# Patient Record
Sex: Female | Born: 1935
Health system: Southern US, Community
[De-identification: ages and names within clinical notes are randomized; demographics above are authoritative.]

## PROBLEM LIST (undated history)

## (undated) DIAGNOSIS — I2510 Atherosclerotic heart disease of native coronary artery without angina pectoris: Secondary | ICD-10-CM

## (undated) DIAGNOSIS — Z8719 Personal history of other diseases of the digestive system: Secondary | ICD-10-CM

## (undated) DIAGNOSIS — J449 Chronic obstructive pulmonary disease, unspecified: Secondary | ICD-10-CM

## (undated) DIAGNOSIS — E785 Hyperlipidemia, unspecified: Secondary | ICD-10-CM

## (undated) DIAGNOSIS — I10 Essential (primary) hypertension: Secondary | ICD-10-CM

## (undated) DIAGNOSIS — I252 Old myocardial infarction: Secondary | ICD-10-CM

## (undated) DIAGNOSIS — I495 Sick sinus syndrome: Secondary | ICD-10-CM

## (undated) DIAGNOSIS — I480 Paroxysmal atrial fibrillation: Secondary | ICD-10-CM

## (undated) DIAGNOSIS — E119 Type 2 diabetes mellitus without complications: Secondary | ICD-10-CM

## (undated) HISTORY — DX: Paroxysmal atrial fibrillation: I48.0

## (undated) HISTORY — DX: Type 2 diabetes mellitus without complications: E11.9

## (undated) HISTORY — DX: Old myocardial infarction: I25.2

## (undated) HISTORY — PX: CHOLECYSTECTOMY: SHX55

## (undated) HISTORY — DX: Personal history of other diseases of the digestive system: Z87.19

## (undated) HISTORY — PX: OTHER SURGICAL HISTORY: SHX169

## (undated) HISTORY — DX: Chronic obstructive pulmonary disease, unspecified: J44.9

## (undated) HISTORY — DX: Essential (primary) hypertension: I10

## (undated) HISTORY — PX: EXPLORATORY LAPAROTOMY: SUR591

## (undated) HISTORY — DX: Atherosclerotic heart disease of native coronary artery without angina pectoris: I25.10

## (undated) HISTORY — DX: Hyperlipidemia, unspecified: E78.5

## (undated) HISTORY — DX: Sick sinus syndrome: I49.5

---

## 1975-10-21 HISTORY — PX: PARTIAL COLECTOMY: SHX5273

## 1997-10-20 DIAGNOSIS — I252 Old myocardial infarction: Secondary | ICD-10-CM

## 1997-10-20 HISTORY — PX: OTHER SURGICAL HISTORY: SHX169

## 1997-10-20 HISTORY — DX: Old myocardial infarction: I25.2

## 2000-01-17 ENCOUNTER — Inpatient Hospital Stay (HOSPITAL_COMMUNITY): Admission: EM | Admit: 2000-01-17 | Discharge: 2000-01-24 | Payer: Self-pay | Admitting: Emergency Medicine

## 2000-01-17 ENCOUNTER — Encounter: Payer: Self-pay | Admitting: Emergency Medicine

## 2001-08-06 ENCOUNTER — Ambulatory Visit (HOSPITAL_COMMUNITY): Admission: RE | Admit: 2001-08-06 | Discharge: 2001-08-06 | Payer: Self-pay | Admitting: Family Medicine

## 2001-08-06 ENCOUNTER — Encounter: Payer: Self-pay | Admitting: Family Medicine

## 2001-09-01 ENCOUNTER — Ambulatory Visit (HOSPITAL_COMMUNITY): Admission: RE | Admit: 2001-09-01 | Discharge: 2001-09-01 | Payer: Self-pay | Admitting: Cardiology

## 2001-09-06 ENCOUNTER — Ambulatory Visit (HOSPITAL_COMMUNITY): Admission: RE | Admit: 2001-09-06 | Discharge: 2001-09-06 | Payer: Self-pay | Admitting: Family Medicine

## 2001-09-06 ENCOUNTER — Encounter: Payer: Self-pay | Admitting: Family Medicine

## 2002-02-21 ENCOUNTER — Encounter: Payer: Self-pay | Admitting: Family Medicine

## 2002-02-21 ENCOUNTER — Inpatient Hospital Stay (HOSPITAL_COMMUNITY): Admission: AD | Admit: 2002-02-21 | Discharge: 2002-02-26 | Payer: Self-pay | Admitting: Family Medicine

## 2002-02-22 ENCOUNTER — Encounter: Payer: Self-pay | Admitting: Family Medicine

## 2002-08-01 ENCOUNTER — Encounter: Payer: Self-pay | Admitting: Family Medicine

## 2002-08-01 ENCOUNTER — Ambulatory Visit (HOSPITAL_COMMUNITY): Admission: RE | Admit: 2002-08-01 | Discharge: 2002-08-01 | Payer: Self-pay | Admitting: Family Medicine

## 2002-08-08 ENCOUNTER — Ambulatory Visit (HOSPITAL_COMMUNITY): Admission: RE | Admit: 2002-08-08 | Discharge: 2002-08-08 | Payer: Self-pay | Admitting: Family Medicine

## 2002-08-08 ENCOUNTER — Encounter: Payer: Self-pay | Admitting: Family Medicine

## 2002-08-15 ENCOUNTER — Encounter: Payer: Self-pay | Admitting: Family Medicine

## 2002-08-15 ENCOUNTER — Inpatient Hospital Stay (HOSPITAL_COMMUNITY): Admission: EM | Admit: 2002-08-15 | Discharge: 2002-08-18 | Payer: Self-pay | Admitting: Emergency Medicine

## 2002-08-15 ENCOUNTER — Encounter: Payer: Self-pay | Admitting: Internal Medicine

## 2002-08-15 ENCOUNTER — Ambulatory Visit (HOSPITAL_COMMUNITY): Admission: RE | Admit: 2002-08-15 | Discharge: 2002-08-15 | Payer: Self-pay | Admitting: Family Medicine

## 2002-08-16 ENCOUNTER — Encounter: Payer: Self-pay | Admitting: Internal Medicine

## 2002-08-18 ENCOUNTER — Encounter: Payer: Self-pay | Admitting: Family Medicine

## 2002-09-21 ENCOUNTER — Ambulatory Visit (HOSPITAL_COMMUNITY): Admission: RE | Admit: 2002-09-21 | Discharge: 2002-09-21 | Payer: Self-pay | Admitting: Family Medicine

## 2002-09-21 ENCOUNTER — Inpatient Hospital Stay (HOSPITAL_COMMUNITY): Admission: AD | Admit: 2002-09-21 | Discharge: 2002-09-26 | Payer: Self-pay | Admitting: Internal Medicine

## 2002-09-21 ENCOUNTER — Encounter: Payer: Self-pay | Admitting: Family Medicine

## 2002-09-22 ENCOUNTER — Encounter: Payer: Self-pay | Admitting: Internal Medicine

## 2002-10-17 ENCOUNTER — Encounter: Payer: Self-pay | Admitting: Family Medicine

## 2002-10-17 ENCOUNTER — Ambulatory Visit (HOSPITAL_COMMUNITY): Admission: RE | Admit: 2002-10-17 | Discharge: 2002-10-17 | Payer: Self-pay | Admitting: Family Medicine

## 2002-11-07 ENCOUNTER — Encounter (HOSPITAL_COMMUNITY): Admission: RE | Admit: 2002-11-07 | Discharge: 2002-12-07 | Payer: Self-pay | Admitting: Cardiology

## 2003-01-09 ENCOUNTER — Ambulatory Visit (HOSPITAL_COMMUNITY): Admission: RE | Admit: 2003-01-09 | Discharge: 2003-01-09 | Payer: Self-pay | Admitting: Family Medicine

## 2003-01-09 ENCOUNTER — Encounter: Payer: Self-pay | Admitting: Family Medicine

## 2003-08-21 ENCOUNTER — Ambulatory Visit (HOSPITAL_COMMUNITY): Admission: RE | Admit: 2003-08-21 | Discharge: 2003-08-21 | Payer: Self-pay | Admitting: Family Medicine

## 2003-08-31 ENCOUNTER — Inpatient Hospital Stay (HOSPITAL_COMMUNITY): Admission: AD | Admit: 2003-08-31 | Discharge: 2003-09-07 | Payer: Self-pay | Admitting: Family Medicine

## 2003-09-25 ENCOUNTER — Inpatient Hospital Stay (HOSPITAL_COMMUNITY): Admission: EM | Admit: 2003-09-25 | Discharge: 2003-09-27 | Payer: Self-pay | Admitting: *Deleted

## 2003-11-07 ENCOUNTER — Ambulatory Visit (HOSPITAL_COMMUNITY): Admission: RE | Admit: 2003-11-07 | Discharge: 2003-11-07 | Payer: Self-pay | Admitting: Family Medicine

## 2003-12-15 ENCOUNTER — Ambulatory Visit (HOSPITAL_COMMUNITY): Admission: RE | Admit: 2003-12-15 | Discharge: 2003-12-15 | Payer: Self-pay | Admitting: Family Medicine

## 2003-12-19 ENCOUNTER — Inpatient Hospital Stay (HOSPITAL_COMMUNITY): Admission: AD | Admit: 2003-12-19 | Discharge: 2003-12-25 | Payer: Self-pay | Admitting: Family Medicine

## 2004-07-19 ENCOUNTER — Ambulatory Visit (HOSPITAL_COMMUNITY): Admission: RE | Admit: 2004-07-19 | Discharge: 2004-07-19 | Payer: Self-pay | Admitting: Family Medicine

## 2004-08-20 HISTORY — PX: ESOPHAGOGASTRODUODENOSCOPY: SHX1529

## 2004-08-20 HISTORY — PX: GIVENS CAPSULE STUDY: SHX5432

## 2004-08-27 ENCOUNTER — Ambulatory Visit: Payer: Self-pay | Admitting: Urgent Care

## 2004-08-30 ENCOUNTER — Ambulatory Visit (HOSPITAL_COMMUNITY): Admission: RE | Admit: 2004-08-30 | Discharge: 2004-08-30 | Payer: Self-pay | Admitting: Internal Medicine

## 2004-09-02 ENCOUNTER — Ambulatory Visit (HOSPITAL_COMMUNITY): Admission: RE | Admit: 2004-09-02 | Discharge: 2004-09-02 | Payer: Self-pay | Admitting: Internal Medicine

## 2004-09-02 ENCOUNTER — Ambulatory Visit: Payer: Self-pay | Admitting: Internal Medicine

## 2004-09-19 ENCOUNTER — Ambulatory Visit (HOSPITAL_COMMUNITY): Admission: RE | Admit: 2004-09-19 | Discharge: 2004-09-19 | Payer: Self-pay | Admitting: Internal Medicine

## 2004-09-19 HISTORY — PX: COLONOSCOPY: SHX174

## 2004-11-13 ENCOUNTER — Ambulatory Visit: Payer: Self-pay | Admitting: Internal Medicine

## 2005-01-08 ENCOUNTER — Ambulatory Visit: Payer: Self-pay | Admitting: Internal Medicine

## 2005-03-10 ENCOUNTER — Ambulatory Visit: Payer: Self-pay | Admitting: *Deleted

## 2005-04-17 ENCOUNTER — Ambulatory Visit: Payer: Self-pay | Admitting: Internal Medicine

## 2005-05-05 ENCOUNTER — Ambulatory Visit (HOSPITAL_COMMUNITY): Admission: RE | Admit: 2005-05-05 | Discharge: 2005-05-05 | Payer: Self-pay | Admitting: Ophthalmology

## 2005-07-14 ENCOUNTER — Ambulatory Visit (HOSPITAL_COMMUNITY): Admission: RE | Admit: 2005-07-14 | Discharge: 2005-07-14 | Payer: Self-pay | Admitting: Family Medicine

## 2005-07-18 ENCOUNTER — Ambulatory Visit: Payer: Self-pay | Admitting: *Deleted

## 2005-07-23 ENCOUNTER — Ambulatory Visit: Payer: Self-pay | Admitting: *Deleted

## 2005-07-23 ENCOUNTER — Inpatient Hospital Stay (HOSPITAL_COMMUNITY): Admission: AD | Admit: 2005-07-23 | Discharge: 2005-08-02 | Payer: Self-pay | Admitting: Internal Medicine

## 2005-08-05 ENCOUNTER — Ambulatory Visit: Payer: Self-pay | Admitting: *Deleted

## 2005-08-07 ENCOUNTER — Ambulatory Visit: Payer: Self-pay | Admitting: Internal Medicine

## 2005-08-14 ENCOUNTER — Ambulatory Visit: Payer: Self-pay | Admitting: Internal Medicine

## 2005-08-28 ENCOUNTER — Ambulatory Visit: Payer: Self-pay | Admitting: Internal Medicine

## 2005-10-09 ENCOUNTER — Ambulatory Visit: Payer: Self-pay | Admitting: Internal Medicine

## 2006-03-20 ENCOUNTER — Ambulatory Visit (HOSPITAL_COMMUNITY): Admission: RE | Admit: 2006-03-20 | Discharge: 2006-03-20 | Payer: Self-pay | Admitting: Family Medicine

## 2007-04-08 ENCOUNTER — Ambulatory Visit (HOSPITAL_COMMUNITY): Admission: RE | Admit: 2007-04-08 | Discharge: 2007-04-08 | Payer: Self-pay | Admitting: Ophthalmology

## 2007-06-29 ENCOUNTER — Ambulatory Visit (HOSPITAL_COMMUNITY): Admission: RE | Admit: 2007-06-29 | Discharge: 2007-06-29 | Payer: Self-pay | Admitting: Family Medicine

## 2008-05-31 ENCOUNTER — Ambulatory Visit (HOSPITAL_COMMUNITY): Admission: RE | Admit: 2008-05-31 | Discharge: 2008-05-31 | Payer: Self-pay | Admitting: Family Medicine

## 2008-12-08 ENCOUNTER — Ambulatory Visit: Payer: Self-pay | Admitting: Cardiology

## 2008-12-21 ENCOUNTER — Ambulatory Visit: Payer: Self-pay | Admitting: Cardiology

## 2008-12-21 ENCOUNTER — Encounter (HOSPITAL_COMMUNITY): Admission: RE | Admit: 2008-12-21 | Discharge: 2009-01-20 | Payer: Self-pay | Admitting: Cardiology

## 2009-06-22 ENCOUNTER — Ambulatory Visit (HOSPITAL_COMMUNITY): Admission: RE | Admit: 2009-06-22 | Discharge: 2009-06-22 | Payer: Self-pay | Admitting: Family Medicine

## 2009-07-05 ENCOUNTER — Ambulatory Visit (HOSPITAL_COMMUNITY): Admission: RE | Admit: 2009-07-05 | Discharge: 2009-07-05 | Payer: Self-pay | Admitting: Family Medicine

## 2009-12-26 DIAGNOSIS — E119 Type 2 diabetes mellitus without complications: Secondary | ICD-10-CM | POA: Insufficient documentation

## 2009-12-26 DIAGNOSIS — I10 Essential (primary) hypertension: Secondary | ICD-10-CM

## 2009-12-26 DIAGNOSIS — J449 Chronic obstructive pulmonary disease, unspecified: Secondary | ICD-10-CM

## 2009-12-26 DIAGNOSIS — I2510 Atherosclerotic heart disease of native coronary artery without angina pectoris: Secondary | ICD-10-CM

## 2009-12-26 DIAGNOSIS — E785 Hyperlipidemia, unspecified: Secondary | ICD-10-CM

## 2009-12-28 ENCOUNTER — Ambulatory Visit: Payer: Self-pay | Admitting: Cardiology

## 2010-01-14 ENCOUNTER — Ambulatory Visit (HOSPITAL_COMMUNITY): Admission: RE | Admit: 2010-01-14 | Discharge: 2010-01-14 | Payer: Self-pay | Admitting: Family Medicine

## 2010-08-01 ENCOUNTER — Ambulatory Visit (HOSPITAL_COMMUNITY): Admission: RE | Admit: 2010-08-01 | Discharge: 2010-08-01 | Payer: Self-pay | Admitting: Family Medicine

## 2010-11-10 ENCOUNTER — Encounter: Payer: Self-pay | Admitting: Cardiology

## 2010-11-19 NOTE — Assessment & Plan Note (Signed)
Summary: 1 YR F/U PER CHECKOUT ON 12/08/08/TG   Visit Type:  Follow-up Primary Provider:  Dr.Mark Nobie Putnam  CC:  no complaints today pt on oxygen.  History of Present Illness: [name] returns today for evaluation of her coronary disease. She is having no angina or ischemic symptoms. Her stress Myoview December 21, 2008 showed ejection fraction of 50% with breast attenuation. She is at a previous intervention of her right coronary artery years ago.  She's lost a significant amount of weight. She dropped from 216 2 today weighing 160 pounds.  She wears oxygen continuously.  She denies orthopnea, PND or peripheral edema. She's had no palpitations or syncope.  Current Medications (verified): 1)  Advair Diskus 500-50 Mcg/dose Aepb (Fluticasone-Salmeterol) .... Use As Needed 2)  Furosemide 40 Mg Tabs (Furosemide) .... Take 1 Tab Daily 3)  Glipizide 5 Mg Tabs (Glipizide) .... Take 1 Tab Daily 4)  Aspir-Low 81 Mg Tbec (Aspirin) .... Take 1 Tab Daily 5)  Metoprolol Tartrate 50 Mg Tabs (Metoprolol Tartrate) .... Take 1/2 Tab Two Times A Day 6)  Lipitor 10 Mg Tabs (Atorvastatin Calcium) .... Take 1/2 Tab Daily 7)  Omeprazole 20 Mg Cpdr (Omeprazole) .... Take 1 Tab Daily 8)  Diovan 160 Mg Tabs (Valsartan) .... Take 1 Tab Daily 9)  Spiriva Handihaler 18 Mcg Caps (Tiotropium Bromide Monohydrate) .... Use As Needed 10)  Imdur 30 Mg Xr24h-Tab (Isosorbide Mononitrate) .... Take 1 Tab Daily 11)  Niacin Cr 500 Mg Cr-Caps (Niacin) .... Take 1 Tab Daily 12)  Fish Oil 1000 Mg Caps (Omega-3 Fatty Acids) .... Take 1 Cap Qid 13)  Duoneb 0.5-2.5 (3) Mg/61ml Soln (Ipratropium-Albuterol) .... As Needed  Allergies (verified): No Known Drug Allergies  Past History:  Past Medical History: Last updated: January 23, 2010 Current Problems:  ASTHMA (ICD-493.90) CAD (ICD-414.00) HYPERTENSION (ICD-401.9) HYPERLIPIDEMIA (ICD-272.4) DM (ICD-250.00)  Past Surgical History: Last updated: 01-23-2010 cesareen  section small bowel obstruction coronary stenting cholecystectomy  Family History: Last updated: January 23, 2010 Father:deceased cause unknown Mother:deceased cause unknown  Review of Systems       negative other than history of present illness  Vital Signs:  Patient profile:   77 year old female Height:      64 inches Weight:      160 pounds BMI:     27.56 Pulse rate:   57 / minute BP sitting:   149 / 57  (right arm)  Vitals Entered By: Kathy Saa, CNA (December 28, 2009 11:44 AM)  Physical Exam  General:  chronically ill, wearing O2 Head:  normocephalic and atraumatic Eyes:  PERRLA/EOM intact; conjunctiva and lids normal. Neck:  Neck supple, no JVD. No masses, thyromegaly or abnormal cervical nodes. Lungs:  decreased breath sounds throughout Heart:  soft S1-S2, regular rate and rhythm Msk:  decreased ROM.   Pulses:  pulses normal in all 4 extremities Extremities:  No clubbing or cyanosis. Neurologic:  Alert and oriented x 3. Skin:  Intact without lesions or rashes. Psych:  Normal affect.   Impression & Recommendations:  Problem # 1:  CAD (ICD-414.00) Assessment Unchanged  Her updated medication list for this problem includes:    Aspir-low 81 Mg Tbec (Aspirin) .Marland Kitchen... Take 1 tab daily    Metoprolol Tartrate 50 Mg Tabs (Metoprolol tartrate) .Marland Kitchen... Take 1/2 tab two times a day    Imdur 30 Mg Xr24h-tab (Isosorbide mononitrate) .Marland Kitchen... Take 1 tab daily  Problem # 2:  HYPERTENSION (ICD-401.9) Assessment: Unchanged  Her updated medication list for this problem includes:  Furosemide 40 Mg Tabs (Furosemide) .Marland Kitchen... Take 1 tab daily    Aspir-low 81 Mg Tbec (Aspirin) .Marland Kitchen... Take 1 tab daily    Metoprolol Tartrate 50 Mg Tabs (Metoprolol tartrate) .Marland Kitchen... Take 1/2 tab two times a day    Diovan 160 Mg Tabs (Valsartan) .Marland Kitchen... Take 1 tab daily  Problem # 3:  HYPERLIPIDEMIA (ICD-272.4)  Her updated medication list for this problem includes:    Lipitor 10 Mg Tabs (Atorvastatin  calcium) .Marland Kitchen... Take 1/2 tab daily    Niacin Cr 500 Mg Cr-caps (Niacin) .Marland Kitchen... Take 1 tab daily  Problem # 4:  DM (ICD-250.00) Assessment: Improved  Her updated medication list for this problem includes:    Glipizide 5 Mg Tabs (Glipizide) .Marland Kitchen... Take 1 tab daily    Aspir-low 81 Mg Tbec (Aspirin) .Marland Kitchen... Take 1 tab daily    Diovan 160 Mg Tabs (Valsartan) .Marland Kitchen... Take 1 tab daily  Patient Instructions: 1)  Your physician recommends that you schedule a follow-up appointment in: 12 months 2)  Your physician recommends that you continue on your current medications as directed. Please refer to the Current Medication list given to you today.

## 2010-12-23 ENCOUNTER — Encounter: Payer: Self-pay | Admitting: *Deleted

## 2011-01-14 ENCOUNTER — Ambulatory Visit (INDEPENDENT_AMBULATORY_CARE_PROVIDER_SITE_OTHER): Payer: Medicare Other | Admitting: Cardiology

## 2011-01-14 ENCOUNTER — Encounter: Payer: Self-pay | Admitting: Cardiology

## 2011-01-14 VITALS — BP 143/66 | HR 62 | Ht 64.0 in | Wt 188.0 lb

## 2011-01-14 DIAGNOSIS — I2510 Atherosclerotic heart disease of native coronary artery without angina pectoris: Secondary | ICD-10-CM

## 2011-01-14 DIAGNOSIS — G4700 Insomnia, unspecified: Secondary | ICD-10-CM

## 2011-01-14 MED ORDER — DIAZEPAM 5 MG PO TABS: 5.0000 mg | ORAL_TABLET | Freq: Every evening | ORAL | Status: DC | PRN

## 2011-01-14 NOTE — Assessment & Plan Note (Signed)
Stable, continue to treat medically.

## 2011-01-14 NOTE — Progress Notes (Signed)
   Patient ID: [name], female    DOB: May 29, 1931, 77 y.o.   MRN: [id number]  HPI  [name] returns for E and M of her CAD. She has been depressed after losing her husband [name] last October. She has no angina or chest pain. She wears O2 24/7 and has chronic SOB. We are treating her CAD medically. Compliant with her meds. Establishing with [name] as primary end of this month.  EKG shows SB with RBBB, Old IMI    Review of Systems  [all other systems reviewed and are negative      Physical Exam  Constitutional: She is oriented to person, place, and time. She appears well-developed and well-nourished.       Obese, wearing O2, chronically ill  HENT:  Head: Atraumatic.  Eyes: EOM are normal. Pupils are equal, round, and reactive to light.  Neck: Neck supple. No JVD present. No thyromegaly present.  Cardiovascular: Normal rate, regular rhythm, normal heart sounds and intact distal pulses.        Bilateral bruits, carotid  Pulmonary/Chest: Effort normal.       Decreased BS throughout.  Abdominal: Soft. Bowel sounds are normal.  Musculoskeletal: She exhibits edema.  Neurological: She is alert and oriented to person, place, and time.  Skin: Skin is warm and dry.  Psychiatric:       Depressed affect

## 2011-01-14 NOTE — Patient Instructions (Signed)
Your physician recommends that you schedule a follow-up appointment in: *1 year Your physician has recommended you make the following change in your medication: Dr. Daleen Squibb continued your diazepam for 1 month until you see Dr. Juanetta Gosling

## 2011-01-30 ENCOUNTER — Encounter: Payer: Self-pay | Admitting: Cardiology

## 2011-03-04 NOTE — Assessment & Plan Note (Signed)
[facility]                       [place] CARDIOLOGY OFFICE NOTE   NAME:[name], [name]                    MRN:          [id number]  DATE:12/08/2008                            DOB:          November 04, 1930    [name] comes in today to reestablish with [place], in particular me as  her cardiologist.   HISTORY OF PRESENT ILLNESS:  [name] is now 77 years of age, married  white female, who has a known history of coronary artery disease.  She  is currently not having any symptoms other than some dyspnea on  exertion, but she has O2-dependent COPD.  She specifically denies any  angina and has not had to use any nitroglycerin in quite some time.   Her last cardiac assessment was actually in 2004.  We did a stress  Myoview for some EKG changes.  This showed EF of 47% with no evidence of  ischemia.   PAST MEDICAL HISTORY:  Cardiac-wise, she has a history of an inferior  wall infarct in February 1999 treated with PTCA and stenting with 3 NIR  stents to the right coronary artery.  She subsequently established with  [place] and her last intervention was high-speed rotational atherectomy with  a cutting balloon angioplasty in 2001.   She has had relatively well-preserved LV function.   Her risk factors for coronary artery disease include age, known CAD,  hypertension, COPD, remote smoking, and hyperlipidemia.   She is followed on regular basis by [name].  She sees Dr.  Sherene Page in Pulmonary.   CURRENT MEDICATIONS:  1. Furosemide 40 mg a day.  2. Glipizide extended release 10 mg a day.  3. Aspirin 81 mg a day.  4. Metoprolol 25 mg p.o. b.i.d.  5. Lipitor 5 mg p.o. nightly.  6. Omeprazole 40 mg a day.  7. Diovan 160 mg a day.  8. Spiriva hand-held nebulizer daily.  9. Isosorbide mononitrate extended release 30 mg p.o. daily.  10.Advair 100/50 one b.i.d.  11.Niacin 500 mg per day.  12.Omega-3 fish oil 1000 mg per day.   She list CODEINE as an  intolerance.  No specific allergies.   She was hospitalized in the past with pneumonia twice.   SURGERIES:  Cholecystectomy.   SOCIAL HISTORY:  She is retired.  She is married to [name], who is  a former patient of mine as well.  She has 2 children.  [place] in  [name]   FAMILY HISTORY:  Negative for premature coronary artery disease.   REVIEW OF SYSTEMS:  HEENT:  Head is negative.  EYE:  She wears glasses  and has had cataracts removed in both eyes.  EAR, NOSE, and THROAT:  Negative.  TEETH/GUM:  She has positive dentures top and bottom.  CARDIORESPIRATORY:  Hypertension and other mentioned in the HPI.  GASTROINTESTINAL:  Negative except for history of an ulcer in the  distant past.  GENITOURINARY:  Negative.  GYNECOLOGICAL:  Negative.  ENDOCRINE:  Negative.  MUSCULOSKELETAL:  Chronic arthritis.  The rest  review of systems are negative.   PHYSICAL EXAMINATION:  VITAL SIGNS:  Blood pressure  is 100/60 in the  right arm and pulse is 59.  Her EKG shows sinus brady with a right  bundle with an inferior wall infarct pattern with persistent ST-segment  changes in II, III, and aVF as well as the V4, V5, and V6.  This is  identical to the EKG changes that were seen in 2003 except for now there  is changes in apical leads well.  HEENT:  She is wearing O2.  PERRLA.  Extraocular movements intact.  Sclerae clear.  Face symmetry is normal.  She has dentures.  Oral mucosa  is unremarkable.  NECK:  Supple.  Carotid upstrokes were equal bilaterally with a very  soft carotid sound in the left.  Thyroid is not enlarged.  Trachea is  midline.  LUNGS:  Clear to auscultation.  Decreased breath sounds throughout.  No  rhonchi or wheezes.  HEART:  A nondisplaced PMI.  She has a soft systolic murmur at the apex.  No gallop or rub.  Heart sounds are pretty soft.  ABDOMEN:  Soft, good bowel sounds.  No midline bruit.  No hepatomegaly  and no pulsatile mass.  EXTREMITIES:  No edema.  Pulses  were present.  There is some varicose  veins.  No sign of DVT.  NEUROLOGIC:  Intact.  SKIN:  Thin, she has a few ecchymoses.   ASSESSMENT AND PLAN:  [name] is long overdue objective assessment  of her coronary artery disease.  She has a very soft systolic sound in  the left side of her neck which is probably not any major obstruction.  However, she does have evidence of atherosclerosis other places.  She is  on excellent medical program per Dr. Nobie Putnam.   PLAN:  1. Adenosine rest stress Myoview.  I suspect this will be stable, but      we need to rule out progressive clinically overt coronary artery      disease.  2. Carotid Dopplers.  3. See me back in a year if these are unremarkable.     Kathy C. Daleen Squibb, MD, [facility]  Electronically Signed    TCW/MedQ  DD: 12/08/2008  DT: 12/09/2008  Job #: [id number]   cc:   Patrica Page, M.D.

## 2011-03-07 NOTE — Discharge Summary (Signed)
[name], [name]                       ACCOUNT NO.:  [id number]   MEDICAL RECORD NO.:  [id number]                   PATIENT TYPE:  INP   LOCATION:  A216                                 FACILITY:  [facility]   PHYSICIAN:  Patrica Duel, M.D.                 DATE OF BIRTH:  Mar 22, 1931   DATE OF ADMISSION:  12/19/2003  DATE OF DISCHARGE:  12/25/2003                                 DISCHARGE SUMMARY   DISCHARGE DIAGNOSES:  1. Exacerbation of chronic obstructive pulmonary disease,     refractory/recurrent with good response to current regimen.  2. Atherosclerotic cardiovascular disease, status post multiple coronary     stenting in 1999.  3. Congestive heart failure, compensated.  Ejection fraction approximately     51% on recent Cardiolite study.  4. Hypertension, controlled.  5. Gastroesophageal reflux disease.  6. Diabetes mellitus well-controlled with diet.   HISTORY OF PRESENT ILLNESS:  For details regarding admission, please refer  to the admitting note.  Briefly, this 76 year old female with the above  history presented to the office on February 19, with apparent asthmatic  bronchitis.  She was treated with steroids, antibiotics and symptomatic  therapy.  She returned six days later complaining of no improvement.  Chest  x-ray was obtained which revealed cardiomegaly and mild pulmonary venous  hypertension, but no infiltrates and Pulmicort was added to her regimen.  She returned to the office two days later with the same complaints and was  admitted for refractory asthmatic bronchitis, considering the possibility of  a component of cardiac asthma.   Of note, the patient has had several episodes quite similar to this event.  She has been admitted multiple times with refractory COPD.  The patient was  admitted for bronchospastic disease which failed to respond to outpatient  therapy.   HOSPITAL COURSE:  The patient was admitted and administered high-dose  steroids as well as  aminophylline drip.  Her heart rate increased somewhat  and this drip was discontinued.  She was placed on p.o. Theo-Dur at 200 mg  b.i.d.  Her heart rate was controlled, however, levels were subtherapeutic  at approximately 6.  She has continued to improve on her steroids and was  converted to p.o. prednisone three days ago.  She has had intermittent  wheezing only and is currently stable for discharge.  The patient requests a  pulmonary consultation by [facility] and Dr. Sherene Sires will see the  patient.   Currently, the patient is stable.  She has been cleared by cardiology.  Her  CHF does not appear to be a component of her current syndrome.  She was  diuresed initially and her BUN and creatinine increased significantly to 90  and 2.5.  This has resolved with lower dose of Lasix and IV hydration.   Currently, the patient is stable for discharge.  She will be followed very  closely as an outpatient.   DISCHARGE  MEDICATIONS:  1. Spiriva one puff q.d.  2. Advair 500/50 one puff b.i.d.  3. Theo-Dur 200 mg b.i.d.  4. Prednisone tapering dose starting with 40 mg over a 10-day period.  5. Toprol XL 25 q.d.  6. Accupril 5 mg q.d.  7. Aspirin.  8. Multivitamins.  9. Lipitor 10 mg q.d.  10.      Levaquin 500 mg q.d.  11.      Lasix (decreased) 40 mg q.d.   FOLLOW UP:  She will be followed and treated expectantly as an outpatient.     ___________________________________________                                         Patrica Duel, M.D.   MC/MEDQ  D:  12/25/2003  T:  12/25/2003  Job:  [id number]   cc:   Charlaine Dalton. Sherene Sires, M.D. [facility]

## 2011-03-07 NOTE — Discharge Summary (Signed)
   [name], Page                       ACCOUNT NO.:  [id number]   MEDICAL RECORD NO.:  [id number]                   PATIENT TYPE:  INP   LOCATION:  A212                                 FACILITY:  [facility]   PHYSICIAN:  Patrica Duel, MD                   DATE OF BIRTH:  [birth year]   DATE OF ADMISSION:  08/15/2002  DATE OF DISCHARGE:  08/18/2002                                 DISCHARGE SUMMARY   DISCHARGE DIAGNOSES:  1. Exacerbation chronic obstructive pulmonary disease.  2. Coronary artery disease.  3. Congestive heart failure, compensated.  4. Diabetes, well controlled.   HISTORY OF PRESENT ILLNESS:  For details regarding admission, please refer  to admitting note.  Briefly, this 77 year old female with history as noted  above has been treated as an outpatient for an acute bronchitis with two  courses of antibiotics, home nebulizers, and steroid injections.  She failed  to respond appropriately and was admitted to the hospital for intensive  pulmonary toilet and IV therapy.   HOSPITAL COURSE:  The patient did very well in the hospital with prompt  resolution of her bronchospasm.  Her pO2 was initially 71.  She has remained  stable for approximately 36 hours without fever, significant cough, or  shortness of breath.   During the night the patient has developed mild hematuria.  Dr. Jerre Simon has  been consulted.  A CT scan of her abdomen currently is pending.   If okay with Dr. Jerre Simon, the patient will be discharged.   DISCHARGE MEDICATIONS:  1. Levaquin 500 mg q.d.  2. Prednisone pack 5 mg 12 day.  3. Sliding scale insulin.  4. Tussionex.  5. She has home nebulizers, etc. at home.   DISPOSITION:  She will be followed and treated expectantly as an outpatient.                                               Patrica Duel, MD    MC/MEDQ  D:  08/18/2002  T:  08/18/2002  Job:  [id number]

## 2011-03-07 NOTE — Op Note (Signed)
   [name], [name]                       ACCOUNT NO.:  [id number]   MEDICAL RECORD NO.:  [id number]                   PATIENT TYPE:  OUT   LOCATION:  RAD                                  FACILITY:  [facility]   PHYSICIAN:  Jae Dire, P.A. [facility]               DATE OF BIRTH:  08-22-1931   DATE OF PROCEDURE:  DATE OF DISCHARGE:  10/17/2002                                  PROCEDURE NOTE   INDICATIONS FOR PROCEDURE:  History of coronary artery disease with new  electrocardiogram changes.   LABORATORY DATA:  EKG sinus rhythm at 71 beats/minute with right bundle  branch block, inverted T in V1-V6, no change from previous EKG on 10/31/02.  Baseline blood pressure 132/72.   DESCRIPTION OF PROCEDURE:  Adenosine was infused over four minutes.  Cardiolite was injected at three minutes.   EKG, no arrhythmias.  No ischemic changes noted.  Blood pressure 140/70.  The patient experienced shortness of breath which resolved in the recovery.   Final images and results are pending M.D. review.                                               Jae Dire, P.A. [facility]    AB/MEDQ  D:  11/07/2002  T:  11/07/2002  Job:  [id number]

## 2011-03-07 NOTE — Consult Note (Signed)
[name], [name]                       ACCOUNT NO.:  [id number]   MEDICAL RECORD NO.:  [id number]                   PATIENT TYPE:  INP   LOCATION:  A216                                 FACILITY:  [facility]   PHYSICIAN:  [place] Bing, M.D.               DATE OF BIRTH:  [birth year]   DATE OF CONSULTATION:  12/19/2003  DATE OF DISCHARGE:                                   CONSULTATION   CONSULTING PHYSICIAN:  [place] Bing, M.D.   REFERRING PHYSICIAN:  Patrica Duel, M.D.   PRIMARY CARDIOLOGIST:  Formerly, Dr. Daleen Squibb.   HISTORY OF PRESENT ILLNESS:  A 77 year old woman with known coronary disease  and severe COPD admitted for progressive dyspnea.  Ms. Bays first  presented with acute myocardial infarction, February 1999, requiring primary  angioplasty and stenting of the RCA.  She returned in 2001, with re-  stenosis, treated with atherectomy.  A Cardiolite, three months ago, was low  risk and demonstrated an ejection fraction of 0.47.  Ms. Mantia developed  cough approximately a week or two ago.  This gradually resolved, but she  subsequently found herself persistently short of breath.  A course of  antibiotics was given.  She was admitted this morning from the office, after  presenting with increasing symptoms.  She had no orthopnea nor PND.  She has  noted some wheezing.  I do not see convincing evidence of prior congestive  heart failure.  She does have an 80 pack year history of cigarette smoking  which was discontinued in 1999.   PAST MEDICAL HISTORY:  1. Otherwise notable for hyperlipidemia.  2. Diabetes.  3. Hypertension.  4. Asthmatic bronchitis.   MEDICATIONS:  Prior to admission included:  1. Atorvastatin 5 mg every day.  2. Glipizide 10 mg every day.  3. Metoprolol 50 mg b.i.d.  4. Furosemide 80 mg every day.  5. Singulair 10 mg every day.  6. Accupril 5 mg every day.  7. Aspirin 81 mg every day.  8. KCl 20 mEq every day.  9. Advair inhaler.  10.       Spiriva inhaler.  11.      Albuterol nebs.   SOCIAL HISTORY:  Lives in [place] with her husband.  Retired from work at  a Human resources officer.  Denies excessive use of alcohol.   FAMILY HISTORY:  Mother died at age 61, cause unknown.  Father died at age  50 with diabetes.   REVIEW OF SYSTEMS:  Notable for urinary frequency, especially after taking  diuretics.  GERD symptoms.  All other systems negative or as noted above.   ALLERGIES:  CODEINE.   PHYSICAL EXAMINATION:  GENERAL:  A pleasant woman, lying flat in bed, in no  acute distress.  VITAL SIGNS:  The temperature is 99, heart rate 60, respirations 20, blood  pressure 180/90 initially; subsequently 125/75.  HEENT:  Anicteric sclera.  NECK:  Mild jugular venous distention.  Soft right bruit heard by __________  .  Minimal jugular venous distention.  ENDOCRINE:  No thyromegaly.  HEMATOPOIETIC:  No adenopathy.  SKIN:  No significant lesions.  CARDIAC:  Normal first and second heart sounds.  LUNGS:  Decreased breath sounds bilaterally.  Mild expiratory wheezing with  prolonged expiratory phase.  No rales.  ABDOMEN:  Soft and nontender.  Normal bowel sounds.  EXTREMITIES:  One half plus ankle edema.  Distal pulses intact.  Mild  inflammation over the right antecubital fossa at a former IV site.  NEUROMUSCULAR:  Symmetric strength and tone.  MUSCULOSKELETAL:  No joint deformities.   Chest x-ray:  Most recent not available.  On December 15, 2003, the report  was cardiomegaly, vascular redistribution, increased perihilar markings and  lingular scarring versus atelectasis.   EKG:  Sinus bradycardia, right bundle branch block, prior inferior  myocardial infarction, borderline low voltage, ST-T wave abnormality  consistent with anterolateral ischemia.   Other laboratory notable for:  Negative cardiac markers.  Normal CBC and  normal chemistry profile, except for minimally increased serum glucose and  bicarbonate.  Blood gas  demonstrates mild chronic respiratory acidosis.   IMPRESSION:  1. Ms. Aber presents with increasing dyspnea that may be attributable to     her known chronic lung disease, chronic heart disease, or a combination     of both.  Her exam is relatively benign.  She had fairly good left     ventricular systolic function when last assessed with no history of     congestive heart failure.  I suspect the current episode represents     primarily a chronic obstructive pulmonary disease exacerbation.  I will     review the chest x-ray when available.  A BNP level is pending.  We will     monitor fluid balance as well as daily weights.  She is receiving maximal     treatment for her lung disease which should produce substantial benefit.  2. There is no evidence for an acute coronary syndrome.  No further     evaluation of her coronary disease is anticipated.  No change in her     usual medical therapy is warranted.  3. Hypertension is well controlled.  4. Management of hyperlipidemia will be assessed.   Will be happy to follow this nice woman with you while she is in hospital.      ___________________________________________                                            [place] Bing, M.D.   RR/MEDQ  D:  12/19/2003  T:  12/20/2003  Job:  [id number]

## 2011-03-07 NOTE — H&P (Signed)
[name], [name]                       ACCOUNT NO.:  [id number]   MEDICAL RECORD NO.:  [id number]                  PATIENT TYPE:   LOCATION:                                       FACILITY:  [facility]   PHYSICIAN:  Patrica Duel, M.D.                 DATE OF BIRTH:  [birth year]   DATE OF ADMISSION:  12/19/2003  DATE OF DISCHARGE:                                HISTORY & PHYSICAL   CHIEF COMPLAINT:  Shortness of breath.   HISTORY OF PRESENT ILLNESS:  This is a 77 year old female with a history of  coronary artery disease, status post multiple coronary stents in 1999.  She  also has CHF with ejection fraction not available.   The patient has been admitted multiple times with exacerbations of COPD.   The patient also has hypertension, gastroesophageal reflux disease, and  diabetes mellitus currently diet controlled.   The patient presented to the office on December 09, 2003, with the chief  complaint of cough and congestion.  She apparently had asthmatic bronchitis,  was treated with steroids, antibiotics, and symptomatic therapy.  She  returned 6 days later complaining of no improvement.  A chest x-ray was  obtained which revealed cardiomegaly and mild pulmonary venous hypertension  but no infiltrates.  Pulmicort was added.   The patient presents back to the office with refractory symptoms.  She is  complaining primarily of orthopnea and dyspnea on exertion.  She denies any  chest pain.  She has a cough which is minimally productive and ongoing  wheezing despite maximal outpatient therapy.   The patient is admitted for exacerbation of COPD, question component of  congestive heart failure.   There is no history of headache, neurologic deficits, nausea, vomiting,  diarrhea, melena, hematemesis, hematochezia, or genitourinary symptoms.   MEDICATIONS:  1. Toprol-XL 25 daily.  2. Accupril 5 mg daily.  3. Aspirin.  4. Multiple vitamins.  5. Lipitor 10 daily.  6. Levaquin 500  daily (approximately 2-week course).  7. Lasix 80 mg daily.  8. Advair 250/50.  9. Spiriva 1 puff daily.   ALLERGIES:  CODEINE.   PAST MEDICAL HISTORY:  As noted above.  She is also status post C-sections  x2 and had a history of intestinal blockage in the remote past.   SOCIAL HISTORY:  Nonspecific (quit approximately 5 years ago).  She denies  use of alcohol.  She has a supportive husband.   FAMILY HISTORY:  Noncontributory.   REVIEW OF SYSTEMS:  Negative except as mentioned.   PHYSICAL EXAMINATION:  GENERAL:  Very pleasant female who appears mildly  cushingoid.  VITAL SIGNS:  Respiratory rate currently is approximately 20 and mildly  labored.  She is afebrile.  Blood pressure 160/95.  HEENT:  Normocephalic, atraumatic.  Pupils are equal.  Ears, nose, throat  are benign.  NECK:  Supple.  LUNGS:  Diffuse coarse wheezes with scattered rhonchi.  No distinct rales  are noted.  HEART:  Heart sounds are somewhat distant.  Without murmurs, rubs, or  gallops.  ABDOMEN:  Nontender, nondistended.  Bowel sounds are intact.  EXTREMITIES:  No clubbing, cyanosis, or edema.  NEUROLOGIC:  Nonfocal.   LABORATORY DATA:  Pending.   ASSESSMENT:  Refractory bronchospastic syndrome.  The patient has a history  of similar episodes in the past.  Consider component of pulmonary  edema/cardiac asthma.   PLAN:  Admit for pulmonary toilet, high-dose steroids, and antibiotic  coverage.  Will obvious cardiology consult.  Consider pulmonary consult  pending her progress.  Will institute amiodarone drip and follow her rhythm  closely.     ___________________________________________                                         Patrica Duel, M.D.   MC/MEDQ  D:  12/19/2003  T:  12/19/2003  Job:  [id number]

## 2011-03-07 NOTE — Cardiovascular Report (Signed)
[facility]  Patient:    [name], [name]                      MRN: [id number] Proc. Date: 01/20/00 Adm. Date:  [id number] Attending:  Rollene Rotunda CC:         Patrica Duel, M.D.             Thomas C. Wall, M.D. [facility]             Cardiac Catheterization Laboratory                        Cardiac Catheterization  PROCEDURES PERFORMED: 1. Left heart catheterization with coronary angiography and left ventriculography. 2. High-speed rotational atherectomy followed by balloon angioplasty with a cutting    balloon followed by percutaneous transluminal coronary angioplasty of the    proximal mid and distal right coronary artery. 3. Intravascular ultrasound of the right coronary artery.  INDICATIONS:  [name] is a 77 year old woman with a history of inferior myocardial infarction in February of 1999, treated with PTCA and stenting x 3 to the right coronary artery.  This was done at [facility] and according to their records she had three 3.5 x 32 mm NIR stents placed in the proximal mid and distal right coronary artery.  She presented to the hospital with unstable angina and was referred for cardiac catheterization.  DESCRIPTION OF PROCEDURE:  A 6 French sheath was placed in the right femoral artery.  Standard Judkins 6 French catheters were utilized.  Contrast was Omnipaque.  There were no complications.  CATHETERIZATION RESULTS:  HEMODYNAMICS:  Left ventricular pressure 138/15.  Aortic pressure 130/65.  No aortic valve gradient.  LEFT VENTRICULOGRAM:  There is moderate akinesis of the inferior wall. Ejection fraction calculated at 49%.  No mitral regurgitation.  CORONARY ARTERIOGRAPHY:  (Right dominant).  Left main:  Left main has a mid 30% stenosis and a distal 25% stenosis.  Left anterior descending:  The LAD has a proximal 25% stenosis followed by a focal area of ectasia followed by a second 25% stenosis.  There is normal sized  first  diagonal which is a 30% stenosis.  Left circumflex:  The left circumflex has a diffuse 20% in the mid vessel.  It gives rise to a normal sized OM-1 with a 30% stenosis, a large OM-2 with a 70% stenosis, small OM-3 and small OM-4.  Right coronary artery:  The right coronary artery is a dominant vessel.  It has  stents extending from the proximal all the way to the distal vessel.  There is diffuse in-stent re-stenosis with 80% stenosis proximally followed by 50%.  The mid vessel has an 80% stenosis and the distal vessel has a 95% in-stent stenosis. Beyond this there is TIMI-1 flow into the distal vessel with some left to right  collateral filling.  There is a posterior descending artery which arises from the acute margin which has a 70% stenosis at its origin.  There is a small first posterolateral branch and a normal second posterolateral branch.  Just proximal to the second posterolateral branch is a 75% stenosis, which was evident only after TIMI-3 was achieved in the distal vessel.  CATHETERIZATION RESULTS: 1. Mildly decreased left ventricular systolic function. 2. Two-vessel coronary artery disease as described with diffuse in-stent    re-stenosis in the proximal mid and distal right coronary artery.  There is    moderate disease in the  obtuse marginal branch which appears to have been    present on previous catheterization in February of 1999.  PLAN:  For percutaneous intervention of the right coronary artery.  See below.  PERCUTANEOUS TRANSLUMINAL CORONARY ANGIOPLASTY PROCEDURE:  Following completion of the diagnostic catheterization we opted to proceed with percutaneous intervention. Preexisting 6 French sheath in the right femoral artery was exchanged over a wire for an 8 [place] sheath.  We placed a 6 French sheath in the right femoral vein.  The patient was enrolled in the Cruise protocol and randomized to receive Lovenox plus Integrilin.  We used an 8  [place] JR 3.5 guiding catheter with side holes. We initially passed a BMW wire into the distal vessel.  We then exchanged through  transient catheter for a Rotafloppy Gold wire.  We then performed high-speed rotational atherectomy with a 1.75 mm bur.  This was done at 179,000 RPM in the  proximal vessel for two runs, each at 15 seconds.  We then advanced and repeated high-speed rotational atherectomy in the mid and distal vessel at 179,000 RPM for two runs, each for approximately 20 seconds.  Following this, we again achieved  TIMI-3 flow into the distal vessel.  We then used a 3.0 x 15 mm cutting balloon, inflated in the distal stent to 6 atmospheres.  The balloon was then pulled back sequentially back to the mid stent with multiple inflations performed, initially to 6 and then 8 atmospheres in the mid vessel.  We then used a 3.5 x 15 mm cutting  balloon in the mid to proximal vessel.  Several inflations were performed up to 6 atmospheres in the mid vessel and 8 atmospheres in the proximal vessel.  At that point there appeared to be a development of a thrombus in the distal vessel which was adherent to the wire.  We pulled the wire out but the thrombus remained more proximally in the stent.  We then passed a new wire into the vessel and this digitalized the thrombus causing it to go distally and there was temporary occlusion of the distal vessel.  We recanalized the vessel and then used a 3.0 20 mm CrossSail balloon in the very distal vessel where there had previously been  75% stenosis and inflated this to 2, 3, and then 5 atmospheres.  We achieved reperfusion in the distal vessel with residual of 25% with haze in that area. e then used a 3.5 x 25 mm Quantum Ranger within the distal stents and inflated this to 12 atmospheres.  This was pulled back within the mid stents and inflated to 4 atmospheres and then back in the more proximal vessel and inflated to 18 atmospheres.   Final angiographic images revealed patency of the right coronary artery with TIMI-3 flow into the distal vessel.  There is less than 20% residual  stenosis throughout the proximal, mid and distal vessel within the previously stented area.  In the very distal vessel in the AV groove portion of the right coronary artery where there had been a 75% stenosis, this was reduced to 25% with residual haze.  There was TIMI-3 flow into the distal vessel.  COMPLICATIONS:  None.  RESULTS:  Successful high-speed rotational atherectomy followed by cutting balloon angioplasty, followed by balloon angioplasty to proximal, mid and distal right coronary artery.  A very distal 75% stenosis was reduced to 25% residual with haze. An 80% in-stent re-stenosis in the proximal and mid vessel and 95% in-stent re-stenosis in the distal vessel were  all reduced to less than 20% residual.  PLAN:  The patient will be given one additional dose of Lovenox this evening. Integrilin will be continued for 24 hours. DD:  01/20/00 TD:  01/20/00 Job: [id number]

## 2011-03-07 NOTE — Op Note (Signed)
NAMESAFIATOU, [name]             ACCOUNT NO.:  [id number]   MEDICAL RECORD NO.:  [id number]          PATIENT TYPE:  AMB   LOCATION:  DAY                           FACILITY:  [facility]   PHYSICIAN:  R. Roetta Sessions, M.D. DATE OF BIRTH:  05/24/1931   DATE OF PROCEDURE:  08/30/2004  DATE OF DISCHARGE:                                 OPERATIVE REPORT   PROCEDURE:  Diagnostic esophagogastroduodenoscopy.   INDICATION FOR PROCEDURE:  The patient is a 77 year old lady with a several-  week history of black, tarry stools but no significant diminution in  hemoglobin.  She has not had any hematochezia.  EGD is now being done.  This  approach has been discussed with the patient at length.  The potential  risks, benefits, and alternatives have been reviewed, questions answered.  She is agreeable.   PROCEDURE NOTE:  O2 saturation, blood pressure, pulse, and respirations were  monitored throughout the entire procedure.   CONSCIOUS SEDATION:  Versed 3 mg IV, Demerol 75 mg IV in divided doses.   INSTRUMENT USED:  Olympus video chip system.   FINDINGS, ESOPHAGOGASTRODUODENOSCOPY:  Examination of the tubular esophagus  revealed no mucosal abnormality.  EG junction was easily traversed.   Stomach:  The gastric cavity was empty and insufflated well with air.  A  thorough examination of the gastric mucosa,  including a retroflexed view of  the proximal stomach and esophagogastric junction, revealed no mucosal  abnormalities.  Pylorus patent and easily traversed.  Examination of the  bulb and second portion revealed no abnormalities.   Therapeutic/diagnostic maneuvers performed:  None.   The patient tolerated the procedure well and was reacted in endoscopy.   IMPRESSION:  Normal esophagus, stomach, D1, D2.   Will recheck CBC today.  She is Hemoccult-positive.  No explanation based on  today's examination.  Had a colonoscopy in 2001 revealing left-sided  diverticula.  Given this scenario, will go  ahead and image her small bowel  via Givens capsule study in the near future and see where we stand with a  CBC today.  Further recommendations to follow.     Otelia Sergeant   RMR/MEDQ  D:  08/30/2004  T:  08/30/2004  Job:  [id number]   cc:   Patrica Duel, M.D.  [address]  [place]  [place] [address]  Fax: [phone]

## 2011-03-07 NOTE — Consult Note (Signed)
NAMEVERNESSA, LIKES             ACCOUNT NO.:  [id number]   MEDICAL RECORD NO.:  [id number]          PATIENT TYPE:  OUT   LOCATION:  RAD                           FACILITY:  [facility]   PHYSICIAN:  R. Roetta Sessions, M.D. DATE OF BIRTH:  09/06/1931   DATE OF CONSULTATION:  08/27/2004  DATE OF DISCHARGE:  07/19/2004                                   CONSULTATION   REQUESTING PHYSICIAN:  Patrica Duel, M.D.   REASON FOR CONSULTATION:  Hemoccult-positive stool.   HISTORY OF PRESENT ILLNESS:  Ms. Rusconi is a 77 year old Caucasian female  with history of GI bleeding in April 2001 while she was on heparin therapy.  More recently two months ago, she noticed melena.  She was having loose  diarrheal stool three times a day for about four to five weeks.  She has not  noticed any for the last two to three weeks.  She was seen by her primary  care physician and given Carafate as well as continuing her Nexium at 40 mg  daily.  She denies any abdominal pain, nausea, vomiting,, heartburn, or  indigestion.  She is on aspirin 81 mg daily.  She is having two to three  semi-formed brown stools per day without mucus or blood currently.  She  denies any dysphagia or odynophagia.   CURRENT MEDICATIONS:  1.  Lipitor 5 mg daily.  2.  Metoprolol 50 mg 1/2 tablet b.i.d.  3.  Nexium 40 mg daily.  4.  Aspirin 81 mg daily.  5.  Spiriva once daily.  6.  Glipizide 10 mg daily.  7.  Lasix 40 mg daily.  8.  Spironolactone 50 mg daily.  9.  Diovan 160 mg daily.  10. Albuterol p.r.n.  11. Cephalexin 250 mg four times a day   ALLERGIES:  CODEINE.   PAST MEDICAL HISTORY:  1.  Diabetes mellitus diagnosed in 1999.  2.  Hypertension.  3.  Hypercholesterolemia.  4.  Congestive heart failure.  5.  Frequent pneumonia with underlying COPD.  6.  C section x 2.  7.  MI in 1999 with cardiac catheterization and stent placement.  8.  Appendectomy.  9.  Cholecystectomy.  10. Small-bowel resection secondary to adhesions  in 1977, currently on home      O2.  11. Colonoscopy Feb 24, 2000, by Dr. Jena Gauss:  Internal hemorrhoids, otherwise      normal rectum, left sided diverticula.  12. EGD January 28, 2000, by Dr.  Jena Gauss revealed multiple small hyperplastic-      appearing sessile polyps with small overlying erosion, scar in the      antrum, CLOtest negative.   FAMILY HISTORY:  No known history of colorectal cancer, liver or abdominal  problems.  Mother, age 34, deceased.  Father, age 27, deceased secondary to  history of diabetes.  She has one healthy sister, two deceased with history  significant for coronary artery disease and an unknown cancer.  She has four  brothers alive and one deceased of unknown etiology.   SOCIAL HISTORY:  Ms. Drzewiecki has been married for 38 years.  She has two  grown, healthy children.  She is currently retired.  She has a 40-pack-year  tobacco use history, quit in 1999.  She denies any alcohol use.   REVIEW OF SYSTEMS:  Stable.  Denies any fever or chills.  Appetite is good.  She is complaining of some fatigue.  CARDIOVASCULAR:  Denies chest pain or  palpitations.  PULMONARY:  She has been on home O2 for the last six months.  Denies any shortness of breath, dyspnea, or cough at this time.  ENDOCRINE:  Does have history of diabetes mellitus currently on oral hyperglycemics.  The patient notes this has been fairly well controlled.  GI:  See HPI.   PHYSICAL EXAMINATION:  VITAL SIGNS:  Weight 226 pounds.  Blood pressure  124/70, pulse 70.  GENERAL:  A 77 year old, pale-appearing, Caucasian female.  She appears her  stated age.  She is on 2 liters O2 via nasal cannula.  No acute distress.  HEENT:  Sclerae clear, nonicteric.  Conjunctivae pink.  Oropharynx moist  without any lesions.  She has upper and lower dentures.  NECK:  Supple without thyromegaly.  HEART:  Regular rate and rhythm.  Normal S1 and S2.  No murmurs, rubs, or  gallops.  LUNGS:  Decreased breath sounds throughout.  No  wheezes or crackles.  ABDOMEN:  Protuberant with positive bowel sounds x 4.  She does have large  vertical scar just left of midline which is well healed.  Nontender,  nondistended without palpable mass or hepatosplenomegaly.  No rebound  tenderness or guarding.  EXTREMITIES:  2+ pedal pulses bilaterally, no edema.  RECTAL:  Deferred.   IMPRESSION:  Ms. Mccaffery is a 77 year old Caucasian female with previous  history of peptic ulcer disease in 2001 who presents with a four to five-  week history of melena three times a day.  She also has history of left-  sided diverticular disease.  Given the fact that her hemoglobin appears to  have remained stable, if she is having upper GI bleeding, it does not appear  to be significant volume.  Given her history, would like to further evaluate  her upper GI tract to rule out possible healing peptic ulcer disease.  She  is CLOtest negative.  She also has a history of diverticular disease and  could have diverticular bleeding as well.  Last colonoscopy was in 2001.   RECOMMENDATIONS:  1.  Will check CBC today.  2.  Will schedule EGD with Dr. Jena Gauss in the very near future.  I discussed      the procedure including risks and benefits to include but not limited to      bleeding, infection, perforation, informed consent will be obtained.  3.  She is to continue Nexium 40 mg daily.  4.  Further recommendations pending EGD.   We would like to thank Dr. Nobie Putnam for allowing [place] to participate in the  care of Ms. Yevonne Pax   KC/MEDQ  D:  08/27/2004  T:  08/27/2004  Job:  [id number]   cc:   Patrica Duel, M.D.  [address], [address]  [place]  [place] [address]  Fax: [phone]

## 2011-03-07 NOTE — Procedures (Signed)
NAME:  ANALISE, GLOTFELTY                       ACCOUNT NO.:  [id number]   MEDICAL RECORD NO.:  [id number]                   PATIENT TYPE:  INP   LOCATION:  A204                                 FACILITY:  [facility]   PHYSICIAN:  Vida Roller, M.D.                DATE OF BIRTH:  1931/05/10   DATE OF PROCEDURE:  09/26/2003  DATE OF DISCHARGE:  09/27/2003                                    STRESS TEST   ADENOSINE-CARDIOLITE STRESS TEST:   INDICATION:  Ms. Juarez is a 77 year old female with known coronary artery  disease status post inferior MI in February 1999 with treatment with stents  x3 to the RCA.  Repeat cardiac catheterization in 2001 revealed in-stent  restenosis which was treated with arthrectomy and cutting balloon  angioplasty to the RCA.  Last Cardiolite was in January 2004 which revealed  no ischemia and an ejection fraction of 51%.  She now presents with  recurrent chest discomfort.  She has had recent cardiac enzymes which are  negative for acute myocardial infarction.   Fifty one milligrams of Adenosine was infused over 4-minute protocol with  Cardiolite injected at 3 minutes.  The patient denied any symptoms.  EKG at  baseline revealed a sinus rhythm at 78 beats per minute with right bundle  branch block, this did not change throughout infusion, no ischemic changes  or arrhythmias were noted.  Baseline blood pressure was 130/68, this  increased to 132/68 during infusion.  Final images and results are pending  MD review.     ________________________________________  ___________________________________________  Jae Dire, P.A. [facility]                      Vida Roller, M.D.   AB/MEDQ  D:  09/26/2003  T:  09/27/2003  Job:  [id number]

## 2011-03-07 NOTE — Group Therapy Note (Signed)
NAMEJEREE, [name]             ACCOUNT NO.:  [id number]   MEDICAL RECORD NO.:  [id number]          PATIENT TYPE:  INP   LOCATION:  A226                          FACILITY:  [facility]   PHYSICIAN:  Edward L. Juanetta Gosling, M.D.DATE OF BIRTH:  30-Dec-1930   DATE OF PROCEDURE:  08/01/2005  DATE OF DISCHARGE:                                   PROGRESS NOTE   SUBJECTIVE:  [name] says she is doing better and hopes to go home  probably tomorrow.   OBJECTIVE:  GENERAL:  Her physical examination shows she looks more  comfortable.  VITAL SIGNS:  Her temperature is 97.6, pulse 94, respirations 20, blood  sugar 80, blood pressure 145/80, O2 saturations 96% on 2 L. CHEST:  Her  chest is much clearer.   ASSESSMENT:  She is better.   PLAN:  She is hopeful to go home tomorrow.  I think the biggest differences  is that her blood sugar has come under control.      Edward L. Juanetta Gosling, M.D.  Electronically Signed     ELH/MEDQ  D:  08/02/2005  T:  08/02/2005  Job:  [id number]

## 2011-03-07 NOTE — H&P (Signed)
[name], [name]                       ACCOUNT NO.:  [id number]   MEDICAL RECORD NO.:  [id number]                  PATIENT TYPE:   LOCATION:                                       FACILITY:   PHYSICIAN:  Corrie Mckusick, M.D.               DATE OF BIRTH:  06-15-1931   DATE OF ADMISSION:  09/25/2003  DATE OF DISCHARGE:                                HISTORY & PHYSICAL   ADMITTING DIAGNOSIS:  Chest pain.   PRIMARY CARE PHYSICIAN:  Patrica Duel, M.D.   HISTORY OF PRESENTING ILLNESS:  This 77 year old female with known history  of coronary artery disease, recently in the hospital for bronchitis,  presents with midsternal chest pain. She awoke in the middle of the night,  around 11 p.m. with such pain.  She had some associated nausea and no  associated diaphoresis or radiation.  Interestingly enough she had been on  loads of antibiotics and prednisone and felt like this could be coming more  from her stomach.  She was brought into the emergency department and put  on a nitroglycerin drip with complete resolution of chest pain from a 6/10  to a 0/10.  Her primary cardiologist, [facility] was consulted who  suggested that she transfer to [facility].  She declined this  transfer, against their advice.  Otherwise respiratory review of systems is  quite negative.   PAST MEDICAL HISTORY:  1. Coronary artery disease.  2. CHF.  3. History of MI with right coronary artery stents x3.  4. History of chronic bronchitis.  5. Hypertension.  6. Diabetes.   PAST SURGICAL HISTORY:  Status post cholecystectomy.   MEDICATIONS:  1. On admission are aspirin 81 mg p.o. daily.  2. Accupril 20 mg 1/4 p.o. daily.  3. Advair 250/50 b.i.d.  4. Singulair 10 mg p.o. daily.  5. Lipitor 5 mg p.o. q.p.m.  6. Demodex 20 mg p.o. q.a.m.  7. Glipizide 10 mg p.o. t.i.d.  8. K-Dur 20 mEq p.o. daily   ALLERGIES:  Codeine.   SOCIAL HISTORY:  Stopped smoking in 1999.  Does not drink any  alcohol.  Married with 2 children.   FAMILY HISTORY:  Significant for diabetes, some hypertension.   REVIEW OF SYSTEMS:  As in HPI.   PHYSICAL EXAMINATION:  VITAL SIGNS:  Temperature 97.9, blood pressure  134/74, heart rate in the 70s, respiratory rate 20s.  GENERAL:  When I saw [name] she was pleasant, talkative, joking, in no  acute distress and no chest pain.  She seemed to be back to her normal self.  HEENT:  Normocephalic, atraumatic. Nasal and oropharynx are clear.  NECK:  Supple. No lymphadenopathy.  CHEST:  Clear to auscultation and percussion.  CARDIOVASCULAR:  Regular rate and rhythm.  Normal S1-S2.  No murmurs,  gallops, clicks or rubs.  ABDOMEN:  Soft, nontender, nondistended.  EXTREMITIES:  No clubbing, cyanosis, or erythema.  Trace edema.  NEUROLOGICALLY:  Intact.   ADMITTING LABS:  White count of 6.0, hemoglobin of 13.4, hematocrit of 40.8,  platelets of 134, INR of 0.9.  Sodium of 138, potassium 4.0, chloride 99,  total CO2 of 30, glucose of 159, BUN 25, creatinine 1.3, total bilirubin  0.4, alkaline phosphatase 56, AST 15, ALT 20, CPK 28, CK/MB 1.5, troponin of  0.8.  Chest x-ray cardiomegaly, changes of bronchitis with no infiltrates or  edema.  EKG showed no significant changes from prior EKG.   ASSESSMENT:  A 77 year old with known coronary disease and multiple  comorbidities who presents with chest pain.  This actually could be from her  GI irritation due to the recent antibiotics and steroids.  She feels it  could be thrush in her mouth.  The oropharynx, otherwise, is quite clear.   PLAN:  1. Admit for rule out protocol.  2. Continue to monitor.  3. Nitro paste to keep chest pain free.  4. [facility] has already been consulted and suggested she transfer to     [facility], which the patient declined.  5. We will start Protonix IV.  6. Nystatin swish and swallow for her throat.  7. Will continue to follow closely.      ___________________________________________                                         Corrie Mckusick, M.D.   JCG/MEDQ  D:  09/25/2003  T:  09/25/2003  Job:  [id number]

## 2011-03-07 NOTE — Group Therapy Note (Signed)
NAMECRYSTALINA, STODGHILL             ACCOUNT NO.:  [id number]   MEDICAL RECORD NO.:  [id number]          PATIENT TYPE:  INP   LOCATION:  A226                          FACILITY:  [facility]   PHYSICIAN:  Edward L. Juanetta Gosling, M.D.DATE OF BIRTH:  [birth year]   DATE OF PROCEDURE:  DATE OF DISCHARGE:                                   PROGRESS NOTE   Patient of Social research officer, government.   SUBJECTIVE:  Mrs. Mussell says she does not feel as well as she would like,  but she is a bit better than she was earlier. She is not as congested, but  she is still wheezing.   Her physical examination shows her temperature is 97, pulse is 84,  respirations 20, blood sugar is 260, blood pressure 132/63. O2 sat is 92% on  2 liters. Her chest is clearer but still with wheezes bilaterally.   ASSESSMENT:  She continues to have problems.   PLAN:  I have discussed his situation with Dr. Nobie Putnam. He is going to try  her on p.o. meds, but she has had a lot of difficulty with IV access so he  is going to go ahead and ask for consultation for possible PIC line because  of the difficulty with IV access. We are concerned that even if we switch  her to p.o. now she may end up needing IV medications later.      Edward L. Juanetta Gosling, M.D.  Electronically Signed     ELH/MEDQ  D:  07/28/2005  T:  07/28/2005  Job:  [id number]

## 2011-03-07 NOTE — Group Therapy Note (Signed)
NAMEJOBY, HERSHKOWITZ             ACCOUNT NO.:  [id number]   MEDICAL RECORD NO.:  [id number]          PATIENT TYPE:  INP   LOCATION:  A226                          FACILITY:  [facility]   PHYSICIAN:  Edward L. Juanetta Gosling, M.D.DATE OF BIRTH:  [birth year]   DATE OF PROCEDURE:  DATE OF DISCHARGE:                                   PROGRESS NOTE   SUBJECTIVE:  [name] says she is better and feels better in general. She  is still some short of breath. She has still got some wheezing.   Her physical exam shows that her temperature is 98.6, pulse 96, respirations  20, blood sugar 195, blood pressure 146/73. O2 sat is 91% on 2 liters. She  still has wheezes bilaterally.   ASSESSMENT:  She is improving.   PLAN:  She is going to have more physical therapy today, try to get up and  moving around. I think that is certainly appropriate and then perhaps she  can go home fairly soon.      Edward L. Juanetta Gosling, M.D.  Electronically Signed     ELH/MEDQ  D:  07/31/2005  T:  07/31/2005  Job:  [id number]

## 2011-03-07 NOTE — Procedures (Signed)
[name], [name]             ACCOUNT NO.:  [id number]   MEDICAL RECORD NO.:  [id number]          PATIENT TYPE:  INP   LOCATION:  A226                          FACILITY:  [facility]   PHYSICIAN:  Vida Roller, M.D.   DATE OF BIRTH:  July 05, 1931   DATE OF PROCEDURE:  07/25/2005  DATE OF DISCHARGE:                                  ECHOCARDIOGRAM   PRIMARY CARE PHYSICIAN:  Madelin Rear. Sherwood Gambler, M.D.   TAPE NUMBER:  [id number].  Tape count [id number].   INDICATIONS:  This is a 77 year old woman with a COPD exacerbation and  assessment for LV systolic function.   Technical quality of the study is adequate.   M-MODE TRACINGS:  The aorta is 34 mm.   The left atrium is 45 mm.   Septum is 16 mm.   Posterior wall is 14 mm.   Left ventricular diastolic dimension 48 mm.   Left ventricular systolic dimension 27 mm.   Two-dimensional echocardiogram and Doppler imaging:  The left ventricle is  normal size with preserved LV systolic function.  Estimated ejection  fraction is 70-75%.  There are no wall motion abnormalities seen, however,  the septum is quite flat.  There is mild concentric left ventricular  hypertrophy.   The right ventricle is markedly dilated.  There is depressed RV systolic  function.  Unfortunately, RV systolic pressure could be assessed due to  inadequate tricuspid regurgitation velocity.   Both atria are enlarged, right greater than left.   The aortic valve is sclerotic with no stenosis or regurgitation.   There is trace mitral regurgitation.   There is trace tricuspid regurgitation.   There is a dilated inferior vena cava.   There is no pericardial effusion.   The ascending aorta was not well-seen.      Vida Roller, M.D.  Electronically Signed     JH/MEDQ  D:  07/25/2005  T:  07/25/2005  Job:  [id number]

## 2011-03-07 NOTE — Consult Note (Signed)
[facility]  Patient:    [name], [name] Visit Number: [id number] MRN: [id number]          Service Type: MED Location: 3A A320 01 Attending Physician:  Patrica Duel Dictated by:   Franky Macho, M.D. Proc. Date: 02/23/02 Admit Date:  02/21/2002   CC:         Elfredia Nevins, M.D.  Valera Castle, M.D.   Consultation Report  REASON FOR CONSULTATION:  Cholelithiasis.  REFERRING PHYSICIAN:  Dr. Sherwood Gambler.  HISTORY OF PRESENT ILLNESS:  The patient is a 77 year old white female who presents with a 5 day history of worsening nausea and vomiting.  She also complains of right upper quadrant abdominal pain which radiates around her right flank to her back.  She states she has a history of fatty food intolerance.  She denies any history of peptic ulcer disease. She has no fever, chills, or jaundice.  PAST MEDICAL HISTORY: 1. Coronary artery disease. 2. Hypertension.  PAST SURGICAL HISTORY: 1. Exploratory laparotomy. 2. Lysis of adhesions. 3. C-sections x 2 in the remote past.  CURRENT MEDICATIONS: 1. Toprol XL. 2. Accupril. 3. Nexium. 4. Lipitor.  ALLERGIES:  No known drug allergies.  REVIEW OF SYSTEMS: The patient denies any recent chest pain, shortness of breath, leg swelling, CVA or diabetes mellitus.  PHYSICAL EXAMINATION:  GENERAL:  The patient is a well-developed well-nourished white female in no acute distress.  She is afebrile.  VITAL SIGNS:  Stable.  HEENT:  Reveals no scleral icterus.  LUNGS: Clear to auscultation with equal breath sounds bilaterally.  HEART:  Reveals a regular rate and rhythm without S3, S4 or murmurs.  ABDOMEN: Soft with tenderness in the right upper quadrant with deep palpation. She has no hepatosplenomegaly, masses, hernias.  She has a lower midline as well as left paramedian scars.  LABORATORY DATA:  CBC within normal limits.  Chem-12 is within normal limits. Liver profile is within normal  limits.  ULTRASOUND:  Ultrasound of the gallbladder reveals small stones without common bile duct dilatation.  IMPRESSION: 1. Biliary colic secondary to cholelithiasis. 2. Coronary artery disease status post stent placement in the past.  PLAN:  The patient is scheduled for a laparoscopic cholecystectomy once she has received preoperative cardiology clearance.  The risks and benefits of the procedure including bleeding, infection, hepatobiliary injury, and the possibility of an open procedure were fully explained to the patient, who gave informed consent.  Dr. Daleen Squibb of cardiology has been consulted as the patient has seen him in the past. Dictated by:   Franky Macho, M.D. Attending Physician:  Patrica Duel DD:  02/23/02 TD:  02/24/02 Job: [id number]

## 2011-03-07 NOTE — H&P (Signed)
NAME:  [name], [name]                       ACCOUNT NO.:  [id number]   MEDICAL RECORD NO.:  [id number]                   PATIENT TYPE:   LOCATION:  219                                  FACILITY:  [facility]   PHYSICIAN:  Corrie Mckusick, M.D.               DATE OF BIRTH:  24-Dec-1930   DATE OF ADMISSION:  DATE OF DISCHARGE:                                HISTORY & PHYSICAL   ADMISSION DIAGNOSES:  1. Asthmatic exacerbation.  2. Dyspnea.   HISTORY OF PRESENTING ILLNESS:  This is a 77 year old female with  longstanding history of diabetes, hyperlipidemia, hypertension, coronary  artery disease, and asthma who presents with cough and mild shortness of  breath.  She has some mild wheezing as well. She was seen on August 21, 2003 in the office with an asthmatic exacerbation with nebulizer treatments  and improvement.  Singulair was restarted at that time.   She saw Dr. Nobie Putnam 2 days later with continued symptoms.  He added Advair.  She has basically with chest congestion, low-grade fevers and wheezing.  She  needs inpatient therapy.  She has no cardiovascular symptomatology.  She  really has no PND, orthopnea, and her lower extremity swelling has improved.   PAST MEDICAL HISTORY:  1. Diabetes.  2. Hyperlipidemia.  3. Hypertension.  4. Asthma.  5. Coronary artery disease.   PAST SURGICAL HISTORY:  1. C-section.  2. Intestinal surgery in 1977 and cardiac stents in 1999.   SOCIAL HISTORY:  She does not drink nor smoke.  Retired.   FAMILY HISTORY:  Noncontributory.   MEDICATIONS ON ADMISSION:  1. Lipitor 10 mg daily.  2. Accuretic 10/12.5 daily.  3. Metoprolol 25 b.i.d.  4. Norvasc 5 mg p.o. daily.   ALLERGIES:  CODEINE.   PHYSICAL EXAMINATION:  VITAL SIGNS:  Blood pressure 160/90, pulse 78,  respirations 22, temperature 99.0.  GENERAL:  When I saw the patient she was somewhat short of breath.  HEENT:  Normocephalic, atraumatic.  Pupils equal and react to light.  Extraocular muscles intact.  Nasal and oropharynx are clear.  NECK:  Supple. No lymphadenopathy.  No JVD.  CHEST:  Diffuse end expiratory wheezes, upper rhonchi, and areas of  consolidation.  CARDIOVASCULAR:  Regular rate and rhythm with a soft, S1-S2; no murmurs.  ABDOMEN:  Soft.  Nontender.  EXTREMITIES:  No cyanosis, clubbing or edema.  Trace pedal edema.   ASSESSMENT:  A 77 year old female with multiple medical problems who  presents with asthmatic bronchitis and dyspnea.   PLAN:  1. Admit to 2A.  2. Aggressive nebulizer treatments.  3. IV antibiotics.  4. Prednisone.  5. Sliding scale coverage.  6. Will set up for chest CT.  7. BNP on admission.  8. CBC, Chem-12, CPK, and troponins on admission.  9. Will continue to follow up closely.     ___________________________________________  Corrie Mckusick, M.D.   JCG/MEDQ  D:  08/31/2003  T:  08/31/2003  Job:  [id number]

## 2011-03-07 NOTE — Op Note (Signed)
[name], [name]             ACCOUNT NO.:  [id number]   MEDICAL RECORD NO.:  [id number]          PATIENT TYPE:  AMB   LOCATION:  DAY                           FACILITY:  [facility]   PHYSICIAN:  R. Roetta Sessions, M.D. DATE OF BIRTH:  May 18, 1931   DATE OF PROCEDURE:  09/19/2004  DATE OF DISCHARGE:                                 OPERATIVE REPORT   PROCEDURE:  Diagnostic colonoscopy and ileoscopy.   INDICATION FOR PROCEDURE:  The patient is a 77 year old lady with a recent  GI bleed.  EGD failed to reveal a cause for bleeding.  She underwent Given  imaging capsule study, which demonstrated a couple of AVMs but no obvious  bleeding source.  Colonoscopy is now being done.  This approach has been  discussed with the patient at length, potential risks, benefits, and  alternatives have been reviewed, questions answered, and she is agreeable.  Please see the documentation in the medical record and my updated note.  It  is notable, the patient clinically has not had any bleeding since previously  seen.   PROCEDURE NOTE:  O2 saturation, blood pressure, pulse, and respiration were  monitored throughout the entire procedure.   CONSCIOUS SEDATION:  IV Versed and Demerol in incremental doses.   INSTRUMENT USED:  Olympus video chip system.   FINDINGS:  Digital rectal exam revealed no abnormalities.   Endoscopic findings:  Prep was good.   Rectum:  Examination of the rectum, including retroflexed view of the anal  verge, revealed no abnormalities.   Colon:  The colonic mucosa was surveyed from the rectosigmoid junction  through the left, transverse, and right colon to the area of the appendiceal  orifice, ileocecal valve, and cecum.  These structures were well-seen and  photographed for the record.  The terminal ileum was intubated to 10 cm.  From this level the scope was slowly withdrawn.  All previously-mentioned  mucosal surfaces were again seen.  The patient was noted to have sigmoid  diverticula.  The remainder of the colonic mucosa appeared normal.  The  patient tolerated the procedure well and was reacted in endoscopy.   IMPRESSION:  1.  Normal rectum.  2.  Left-sided diverticula.  3.  The remainder of the colonic mucosa appeared normal.  4.  Normal terminal ileum.   RECOMMENDATIONS:  1.  Check CBC today.  2.  Diverticulosis literature provided to Ms. Spiegelman.  3.  Screening colonoscopy in 10 years.   Today's findings are reassuring along with her recent prior evaluation of  small bowel and stomach.  Will follow up on the CBC, make further  recommendations.     Otelia Sergeant   RMR/MEDQ  D:  09/19/2004  T:  09/19/2004  Job:  [id number]   cc:   Patrica Duel, M.D.  [address]  [place]  [place] [address]  Fax: [phone]

## 2011-03-07 NOTE — H&P (Signed)
[name], [name]             ACCOUNT NO.:  [id number]   MEDICAL RECORD NO.:  [id number]          PATIENT TYPE:  INP   LOCATION:  A226                          FACILITY:  [facility]   PHYSICIAN:  Madelin Rear. Sherwood Gambler, MD  DATE OF BIRTH:  1931-01-15   DATE OF ADMISSION:  07/23/2005  DATE OF DISCHARGE:  LH                                HISTORY & PHYSICAL   CHIEF COMPLAINT:  Shortness of breath, cough and congestion.   HISTORY OF PRESENT ILLNESS:  The patient has had several weeks of  progressively increasing cough and congestion.  She was recently evaluated  in the office for an exacerbation of chronic obstructive pulmonary disease  treated with some resolution of her symptoms.  In the interim from July 14, 2005, last visit, she increased sputum production, although she denied  fever, rigors or chills.   PAST MEDICAL HISTORY:  Diabetes mellitus type 2, hyperlipidemia,  hypertension, coronary artery disease, underlying asthma.   SOCIAL HISTORY:  Discontinued former smoker.  No alcohol or other drug use.   PAST SURGICAL HISTORY:  She is status post Cesarean section, previous small  bowel obstruction, status post coronary stenting, status post  cholecystectomy.   FAMILY HISTORY:  Parents are deceased, unknown cause. Has a spouse with  coronary disease.  Brothers and sisters all are in good health.  Two  children in good health.   REVIEW OF SYSTEMS:  As under history of present illness, otherwise negative.   PHYSICAL EXAMINATION:  SKIN: Unremarkable.  HEENT: Head and neck with no jugular venous distention.  CHEST: Diffuse inspiratory and expiratory wheezing and rhonchi all fields,  prolonged expiratory phase with evident tachypnea at approximately 20 per  minute.   CLINICAL DATA:  Her most recent chest x-ray on July 14, 2005 showed  stable cardiomegaly, pulmonary vascular congestion and changes of chronic  obstructive pulmonary disease but nothing acute was noted at that  time.  Other labs are pending at present and will be reviewed when available.   IMPRESSION:  1.  Severe exacerbation of chronic obstructive pulmonary disease with      respiratory deficiency.  2.  Hypertension.  3.  Diabetes mellitus.  4.  Coronary artery disease, expectant observation.   PLAN:  She will be admitted for parenteral bronchodilator therapy as well as  continued nebulizer therapy.  Intravenous antibiotics will be instituted for  presumed underlying bronchitis.  Another chest x-ray will be obtained  to rule out pneumonia.  Will manage hypertension with outpatient medications  and intervene as needed.  Patient will be placed on ADA diet, sliding scale  insulin as well as additional use of normal outpatient oral hypoglycemics.  Expectant observation for her coronary artery disease.      Madelin Rear. Sherwood Gambler, MD  Electronically Signed     LJF/MEDQ  D:  07/23/2005  T:  07/23/2005  Job:  [id number]

## 2011-03-07 NOTE — Discharge Summary (Signed)
[facility]  Patient:    [name], [name] Visit Number: [id number] MRN: [id number]          Service Type: MED Location: 2A A201 01 Attending Physician:  Dalia Heading Dictated by:   Franky Macho, M.D. Admit Date:  02/21/2002 Discharge Date: 02/26/2002   CC:         Valera Castle, M.D.   Discharge Summary  HOSPITAL COURSE SUMMARY:  Patient is a 77 year old white female who was admitted by Dr. Patrica Duel for work-up of nausea, vomiting, abdominal pain. She has a history of coronary artery disease.  A surgery consultation was obtained after it was found the patient had small gallstones and sludge.  She was noted to have right upper quadrant and right flank pain.  Liver enzyme tests were within normal limits.  Dr. Daleen Squibb of cardiology saw the patient and cleared her preoperatively for surgery.  She subsequently underwent a laparoscopic cholecystectomy on Feb 25, 2002.  She tolerated the procedure well.  Her immediate postoperative course was remarkable for PVCs.  She was transferred to the telemetry unit.  Since that time she has had no significant PVCs.  She has sinus bradycardia secondary to medications.  The patient is doing well on postoperative day #1.  She is being discharged home in good and improving condition.  DISCHARGE INSTRUCTIONS:  Patient is to follow up with Dr. Franky Macho on Mar 03, 2002.  DISCHARGE MEDICATIONS: 1. Darvocet-N 100 one to two tablets p.o. q.4h. p.r.n. pain. 2. She is to resume her Toprol, Accupril, Nexium, Lipitor as previously    prescribed.  PRINCIPAL DIAGNOSES: 1. Cholecystitis, cholelithiasis. 2. Coronary artery disease. 3. Hypertension.  PRINCIPAL PROCEDURE:  Laparoscopic cholecystectomy on Feb 25, 2002. Dictated by:   Franky Macho, M.D. Attending Physician:  Dalia Heading DD:  02/26/02 TD:  02/28/02 Job: [id number]

## 2011-03-07 NOTE — Discharge Summary (Signed)
[place]  Patient:    [name], [name]                      MRN: [id number] Adm. Date:  [id number] Disc. Date: 01/24/00 Attending:  Rollene Rotunda Dictator:   Tereso Newcomer, P.A. CC:         Jesse Sans. Daleen Squibb, M.D. [facility], [place], [place]             Dr. Nobie Putnam, [phone]                           Discharge Summary  DISCHARGE DIAGNOSES:  1. Congestive heart failure.  2. Status post high-speed rotational atherectomy, balloon angioplasty with     cutting balloon, percutaneous transluminal coronary angioplasty of mid     and distal right coronary artery.  Intravascular ultrasound of right     coronary artery.  3. Moderate akinesis inferior wall, ejection fraction 49% by     catheterization.  4. Cardiac catheterization January 20, 2000, revealing left main 30%, mid 25%     distal.  Left anterior descending 25% proximal.  Diagonal 1 30%.  Left     circumflex 20% mid, obtuse marginal 1 30%, obtuse marginal 2 70%.  Right     coronary artery diffuse end-stent restenosis 80%, followed by 50%.  Mid     80%.  Distal 95% end-stent restenosis.  Left to right collaterals beyond     these lesions.  Posterior descending artery 70% at origin, 75% proximal to     the second posterolateral branch.  5. Anemia, rectal bleed this admission.  6. History of gastrointestinal bleed on heparin at [facility] in 1999.  7. Normal Cardiolite November 2000.  8. A two-dimensional echocardiogram revealing inferoposterior basal akinesis,     ejection fraction 50%, mild right atrial enlargement/right ventricular     enlargement, mild mitral regurgitation and tricuspid regurgitation.  9. Status post posteroinferior myocardial infarction in 1999 at [facility], status post stent to right coronary artery. 10. Diabetes mellitus, type 2, insulin dependent. 11. Hypertension. 12. History of hyperlipidemia. 13. Obesity. 14. Status post  exploratory laparotomy. 15. Status post partial colectomy. 16. Chronic obstructive pulmonary disease by ventilation/perfusion scan this     admission.  ADMISSION HISTORY:  This 77 year old obese white female with a history of CAD, insulin-dependent diabetes mellitus and hyperlipidemia, started feeling more short of breath over the weekend prior to admission.  She contacted her primary physician and was prescribed Lasix 80 mg a day.  Her shortness of breath improved somewhat, but she still reported feeling weak at the time of admission.  She had noted a rapid weight gain when the shortness of breath started as well as an increase in her pedal edema.  The edema had since decreased.  She reported positive orthopnea and paroxysmal nocturnal dyspnea. She denied chest pain, palpitations, nausea or diaphoresis.  She had positive dyspnea on exertion with walking to her mailbox.  She was seen by her primary physicians P.A. on the day of admission and she felt there were some EKG changes since 1999 and sent her to the ER at [facility].  ADMISSION PHYSICAL EXAMINATION:  GENERAL:  Revealed an alert and oriented female in no apparent distress.  VITAL SIGNS:  Blood pressure 102/56, pulse 61,  CARDIAC:  S1 and S2 regular.  LUNGS:  Clear to  auscultation, no wheezes.  NEUROLOGIC:  Nonfocal.  EKG: Sinus brady, right bundle branch block, old inferior MI, anterolateral and inferior T-wave inversion.  These did not appear to be different from previous EKGs available for comparison.  Chest x-ray: No acute disease.  HOSPITAL COURSE:  The patient was admitted for CHF new onset.  She was set up for a 2-D echocardiogram, rule out for MI by enzymes and was put on the catheterization schedule.  She remained stable throughout the weekend.  Her cardiac enzymes were negative x 3.  She was continued on diuretics and diuresed almost 2 liters by day #3 of admission.  The patient was introduced to the  _____ study and consented.  She went for cardiac catheterization on January 20, 2000, the results are noted above.  She had no complications and remained stable status post procedure.  On January 21, 2000, nursing noted fresh blood passing from the rectum.  Dr. Ladona Ridgel was made aware of this and Integrilin was held.  The patients H&H was noted to drop from 11.2 and 36.3 to 9.5 and 30.0.  This did remain stable throughout the rest of her admission. It is recommended that the patient would need a GI evaluation as an outpatient.  The patient did have some weakness and shortness of breath one day after her cardiac catheterization.  She was noted to have some bilateral crackles and her Lasix was changed to IV.  This relieved her shortness of breath and her lung exam cleared.  She had a V/Q scan to evaluate her shortness of breath; this revealed low probability for pulmonary embolus. Chronic obstructive pulmonary disease.  The patient was placed on hand-held nebulizer treatments and set up with cardiac rehab.  She continued to improve after this episode of shortness of breath.  She diuresed over 1.5 liters.  On January 24, 2000, she was feeling much better and was felt stable enough to be discharged to home.  LABORATORY DATA:  Sodium 137, potassium 4.0, chloride 96, CO2 34, glucose 134, BUN 18, creatinine 0.7, calcium 9.1.  WBC 4.8, hemoglobin 9.5, hematocrit 29.5, MCV 77.3, RDW 15.9, platelet count 194.  H&H history throughout admission: 11.8, 39.2; 10.9 and 36.8; 11.2, 36.3, 11.0 and 35.2; 9.9 and a 31.8; 9.5 and 30.0; 8.9 and 28.6; 9.1 and 28.6; 9.5 and 29.5.  Total protein 6.8, albumin 3.6, AST 19, ALT 12, alkaline phosphatase 61, total bilirubin 0.6.  Cardiac enzymes: Total CK (1) 26, (2) 25, (3) 22, (4) 27, (5) 33, (6) 65, (7) 57, (8) 41, (9) 25, (10) 26, (11) 25, (12) 24.  MB (1) 0.5, (2) less than 0.3, (3) 0.4, (4) less than 0.3, (5) 0.4, (6) 1.5, (7) 1.3, (8) 1.3, (9) 0.5, (10) less than 0.3,  (11) less than 0.3, (12) 0.4. Troponin I less than 0.03 x 3.  Lipid profile: Total cholesterol 82,  triglycerides 224, HDL 22, LDL 15.  TSH 2.046.  Iron 58, TIBC 443, percent saturation 13.  DISCHARGE MEDICATIONS: 1. Plavix 75 mg q.d. x 28 days. 2. Humulin insulin 70/30 20 units q.a.m. and 10 units p.m. 3. Accuretic 20/12.5 q.d. 4. Lipitor 10 mg one q.o.d. 5. Norvasc 5 mg q.d. 6. Vitamin E. 7. Enteric-coated aspirin. 8. Lopressor 25 mg b.i.d. 9. Nitroglycerin spray p.r.n. angina.  DISCHARGE INSTRUCTIONS:  The patient was recommended to maintain a low-salt diet of less than 4 g per day.  She was notified to keep her wound clean and dry and to call our office if she  noticed any oozing.  FOLLOWUP:  With Dr. Valera Castle in two weeks in [place].  She should also follow up with Dr. Nobie Putnam in the next 1-2 weeks to follow up on her anemia and initiate a GI workup as an outpatient.  Followup with Dr. Daleen Squibb is set for February 05, 2000, at 11:30 a.m. DD:  01/24/00 TD:  01/24/00 Job: [id number]

## 2011-03-07 NOTE — H&P (Signed)
NAME:  [name], [name]                       ACCOUNT NO.:  [id number]   MEDICAL RECORD NO.:  [id number]                   PATIENT TYPE:  EMS   LOCATION:  ED                                   FACILITY:  [facility]   PHYSICIAN:  Gracelyn Nurse, M.D.              DATE OF BIRTH:  [birth year]   DATE OF ADMISSION:  08/15/2002  DATE OF DISCHARGE:                                HISTORY & PHYSICAL   CHIEF COMPLAINT:  Shortness of breath.   HISTORY OF PRESENT ILLNESS:  This is a 77 year old white female who has a  history of bronchitis and coronary artery disease and CHF.  She presents  tonight with a 3 week history of shortness of breath.  She says she has been  treated as an outpatient for bronchitis with 2 courses of antibiotics, home  nebulizer and steroid injections.  Says she feels worse now.  Shortness of  breath is worse.  No chest pain.  She had a chest x-ray in the office on  08/08/02 and 08/15/02, both that show no infiltrates, just some slight  peribronchial thickening and are unchanged when compared to each other.   PAST MEDICAL HISTORY:  1. Coronary artery disease.     A. History of CHF.     B. History of MI with right coronary artery stent x3.  2. Chronic bronchitis.  3. Hypertension.  4. Diabetes--diet controlled.  5. Status post cholecystectomy.   ALLERGIES:  Codeine.   MEDICATIONS:  1. Toprol XL 25 mg q.d.  2. Accupril 5 mg q.d.  3. Nexium 40 mg q.d.  4. Lipitor 5 mg q.d.   SOCIAL HISTORY:  She stopped smoking in 1999.  She does not drink any  alcohol.  She is married with 2 children.   FAMILY HISTORY:  Mother was 40, died of unknown cause. Father was 71, died  of complications of diabetes.   REVIEW OF SYSTEMS:  As per History of Present Illness.  All other systems  reviewed and are normal.   VITAL SIGNS:  Temperature is 97, pulse 77, respirations 20, blood pressure  156/95, O2 sats 97% on room air.   PHYSICAL EXAMINATION:  GENERAL:  This is a well-nourished  white female who is short of breath.  HEENT:  Pupils equal, round and reactive to light.  Equal ocular movement  intact.  Oral mucosa is moist.  Oropharynx is clear.  CARDIOVASCULAR:  Regular rate and rhythm.  No murmurs.  LUNGS:  Coarse breath sounds.  Bilateral respiratory wheezing.  ABDOMEN:  Soft, nontender, nondistended.  Bowel sounds positive.  EXTREMITIES:  Trace edema.  NEUROLOGIC:  Cranial nerves II-XII grossly intact.  No focal deficits.  Strength is 5/5 bilateral.  SKIN:  Moist with no rash.   ADMITTING LABORATORY DATA:  pH of 7.43, PCO2 is 40, PO2 is 71, bicarb is 25,  Aa gradient is 28.  White blood cells 7.3, hemoglobin 13.9, glucose  is 253,  BNP is 128, CK is 49, MB fraction is 2.2. Sodium is 138, potassium is 4.4,  chloride 104, CO2 28, BUN 22, creatinine 0.8, glucose is 176.   ASSESSMENT/PLAN:  1. Bronchitis.  She has been treated for almost 3 weeks now with good     outpatient therapy but has not responded. Will go ahead and admit her and     start her on IV antibiotics, IV steroids and frequent nebulizers.  Her     chest x-ray certainly does not explain this shortness of breath.  Will     consider ordering CT.  2. Hypoxia.  Her PO2 of 71, which is mildly hypoxic, but she does have a     large Aa gradient of 28.  Chest x-ray does not explain this so we will go     ahead and get a CT of the chest to rule out any type of mass or pulmonary     embolism.  3. Coronary artery disease.  Her enzymes are negative, her EKG is unchanged.     Will continue her on her current medications.  4. Diabetes.  She is diet-controlled, however since she is going to be on     high dose steroids we will add a sliding scale insulin.                                                 Gracelyn Nurse, M.D.    JDJ/MEDQ  D:  08/15/2002  T:  08/15/2002  Job:  [id number]

## 2011-03-07 NOTE — H&P (Signed)
[facility]  Patient:    [name], [name] Visit Number: [id number] MRN: [id number]          Service Type: MED Location: 2A A212 01 Attending Physician:  Patrica Duel Dictated by:   Patrica Duel, M.D. Admit Date:  02/21/2002                           History and Physical  CHIEF COMPLAINT:  Nausea, vomiting, abdominal pain.  HISTORY OF PRESENT ILLNESS:  This is a 77 year old female, with a history of atherosclerotic cardiovascular disease, status post stenting in 1999.  She also has long-standing hypertension, hyperlipidemia, and diet-controlled diabetes mellitus.  She has required insulin therapy in the past but lost weight through diet and exercise, and currently blood sugars have been markedly stable on no therapy.  The patient was seen in the office three days ago complaining of lower abdominal pain, some vomiting, and dysuria.  She was noted to have pyuria and marginal CVAT on the right.  She was treated with Tequin and discharged. Since discharge the patient has continued to experience nausea and vomiting, particularly when she is "up and moving."  She also has pain in her right flank region and right upper quadrant.  She is continuing to experience nausea and vomiting and has persistent pyuria documented in the office.  She is admitted for apparent pyelonephritis.  There is no history compatible with gallbladder disease such as ongoing fatty good intolerance, bloating.  There is no melena, hematemesis, hematochezia, chest pain, shortness of breath, or neurologic problems.  She denies vaginal discharge or other symptoms.  The patient is admitted for probable acute pyelonephritis, to consider other etiologies such as cholecystitis, gastritis, etc.  CURRENT MEDICATIONS:  1. Toprol-XL 25 mg q.d.  2. Accupril 5 mg p.o. q.d.  3. Nexium.  4. Lipitor 5 mg q.d.  ALLERGIES:  None known.  PAST MEDICAL HISTORY:  1. Documented above.  2. She  is also status post cesarean section in 1969 and 1970.  3. Had "intestinal blockage" in the remote past.  SOCIAL HISTORY:  She is a nonsmoker, nondrinker.  She walks regularly without difficulty.  FAMILY HISTORY:  Noncontributory.  PHYSICAL EXAMINATION:  GENERAL:  Very pleasant, fully alert female who is alert and oriented, in mild distress only.  VITAL SIGNS:  TEMP 96 degrees, pulse 70 and regular, respirations 16 and unlabored, blood pressure 140/80.  HEENT:  Normocephalic, atraumatic.  Pupils are equal.  Mucous membranes are mildly dehydrated.  NECK:  Supple.  No bruits, thyromegaly, or lymphadenopathy.  LUNGS:  Clear to A&P.  CARDIAC:  Heart sounds essentially normal without murmurs, rubs, or gallops.  ABDOMEN:  Mild epigastric and right upper quadrant tenderness, with mild CVA tenderness on the right.  Murphys sign is marginally positive.  Bowel sounds are normal.  No masses noted.  EXTREMITIES:  No clubbing, cyanosis, or edema.  NEUROLOGIC:  Within normal limits.  ASSESSMENT:  Possible pyelonephritis.  Consider other entities such as cholecystitis or other.  PLAN:  1. Admit for broad-spectrum antibiotics.  2. Appropriate cultures.  3. Ultrasound of the abdomen.  4. Consider CT scan pending her progress.  5. Will follow and treat expectantly. Dictated by:   Patrica Duel, M.D. Attending Physician:  Patrica Duel DD:  02/21/02 TD:  02/21/02 Job: [id number]

## 2011-03-07 NOTE — Procedures (Signed)
   NAMEMAO, [name]                         ACCOUNT NO.:  [id number]   MEDICAL RECORD NO.:  [id number]                  PATIENT TYPE:   LOCATION:                                       FACILITY:   PHYSICIAN:  Edward L. Juanetta Gosling, M.D.             DATE OF BIRTH:   DATE OF PROCEDURE:  08/16/2002  DATE OF DISCHARGE:                                EKG INTERPRETATION   TIME AND DATE:  5:59 on 08/16/02   INTERPRETATION:  The rhythm is sinus rhythm with a rate in the 80s.  There  is right bundle branch block. Q waves are seen inferiorly and these may be  related to the right bundle branch block, and clinical correlation is  suggested.  Consistent with probable anterolateral myocardial infarction,  there are also Q waves in anterior leads; this may also be related to the  bundle branch block and, again, clinical correlation is suggested.  There  are fairly marked T wave changes.   IMPRESSION:  Abnormal electrocardiogram.                                               Edward L. Juanetta Gosling, M.D.    ELH/MEDQ  D:  08/16/2002  T:  08/16/2002  Job:  [id number]

## 2011-03-07 NOTE — Group Therapy Note (Signed)
NAMENEL, STONEKING             ACCOUNT NO.:  [id number]   MEDICAL RECORD NO.:  [id number]          PATIENT TYPE:  INP   LOCATION:  A226                          FACILITY:  [facility]   PHYSICIAN:  Edward L. Juanetta Gosling, M.D.DATE OF BIRTH:  [birth year]   DATE OF PROCEDURE:  07/26/2005  DATE OF DISCHARGE:                                   PROGRESS NOTE   PRIMARY CARE PHYSICIAN:  Madelin Rear. Sherwood Gambler, MD.   SUBJECTIVE:  Ms. Oka says she is still not feeling very well.  She is  very weak and her blood pressure has been low, so I think we should go ahead  and hold her Avapro.  She is otherwise about the same.  She has no new  complaints.   PHYSICAL EXAMINATION:  Her physical examination shows that her temperature  is 96.9, pulse 88, respirations 20/  Blood sugar is 208; it has been as high  as 365, probably because of her steroids.  Her  blood pressure 95/51, O2  saturation is 93%.  Her echo showed a right sided enlargement but good  ejection fraction.   ASSESSMENT:  She seems a little better.   PLAN:  Plan is to continue treatments.  I am encouraging her to get up and  moving around.  I have gone ahead and discontinue Avapro for now.      Edward L. Juanetta Gosling, M.D.  Electronically Signed     ELH/MEDQ  D:  07/26/2005  T:  07/26/2005  Job:  [id number]

## 2011-03-07 NOTE — Consult Note (Signed)
[facility]  Patient:    [name], [name] Visit Number: [id number] MRN: [id number]          Service Type: MED Location: 3A A320 01 Attending Physician:  Patrica Duel Dictated by:   Valera Castle, M.D. Proc. Date: 02/23/02 Admit Date:  02/21/2002   CC:         Patrica Duel, M.D.  Franky Macho, M.D.   Consultation Report  Preoperative clearance by Dr. Lovell Sheehan.  HISTORY OF PRESENT ILLNESS:  [name] is a patient well-known to me.  She is a 77 year old married white female with previous coronary artery disease status post diaphragmatic myocardial infarction in February 1999 with subsequent PTCA and stenting of the right coronary artery x3 with residual 70% obtuse marginal branch.  At that time she had an ejection fraction of 49%.  It was complicated by third degree AV block and decreased blood pressure.  She subsequently had high speed rotational ablation and PTCA with a cutting balloon of the mid and distal right coronary artery in April 2001.  Ejection fraction is still 49%.  Dipyridamole Cardiolite November 2002 was negative for ischemia with inferior wall scarring.  Ejection fraction 44%.  She has been remarkably motivated with reducing her cardiac risk factors.  She has lost a tremendous amount of weight, participating in cardiac rehabilitation, and is still walking two miles a day without any symptoms, and has actually eliminated her diabetic medications and almost eliminated her hypertensive medications as well as her lipid lowering drug.  ALLERGIES:  No known drug allergies.  PAST MEDICAL HISTORY:  She had a GI bleed on heparin at [facility] in 1999.  She has had a history of congestive heart failure, now resolved.  She has had a history of diabetes now resolved.  She has had hyperlipidemia on low dose Statin.  She has chronic obstructive lung disease, but quit smoking in 1999. She has had a partial colectomy.  SOCIAL HISTORY:  She is  married to [name] here in [place].  FAMILY HISTORY:  Noncontributory.  REVIEW OF SYSTEMS:  Noncontributory.  PHYSICAL EXAMINATION  VITAL SIGNS:  Blood pressure 134/58, pulse 52 and regular, afebrile, respiratory rate 18 and unlabored.  GENERAL:  She is in no acute distress.  HEENT:  Unremarkable.  NECK:  No JVD.  Carotid upstrokes are equal bilaterally without bruits.  No thyromegaly.  LUNGS:  Clear.  HEART:  Soft systolic murmur at the apex.  ABDOMEN:  Soft with good bowel sounds.  She has a tender right upper quadrant.  EXTREMITIES:  No clubbing, cyanosis, edema.  Pulses were intact.  LABORATORIES:  Chest x-ray shows cardiomegaly without infiltrates or heart failure.  EKG shows normal sinus rhythm, inferior Q-waves, and a right bundle branch block pattern.  This is stable.  Laboratory data is stable except for potassium 3.5.  CPKs are negative x2 as are troponins x2.  ASSESSMENT: 1. Cholelithiasis for cholecystectomy on Friday.  Patient has been off aspirin    since Sunday because of inability to take p.o. medications. 2. Coronary artery disease, stable.  Patient has mild left ventricular    dysfunction with ejection fraction of 49%.  Last dipyridamole Cardiolite    was negative for ischemia in November 2002.  She is at low operative risk for laparoscopic cholecystectomy or for cholecystectomy with general anesthesia.  Only precautions I would use is telemetry, IV beta blockade while not taking p.o.s, okay to hold aspirin until okay with Dr. Lovell Sheehan, supplement potassium, watch volume or volume  overload.  Thanks for the consultation.  Will follow with you. Dictated by:   Valera Castle, M.D. Attending Physician:  Patrica Duel DD:  02/23/02 TD:  02/25/02 Job: [id number]

## 2011-03-07 NOTE — Procedures (Signed)
   NAMETANZANIA, BASHAM                       ACCOUNT NO.:  [id number]   MEDICAL RECORD NO.:  [id number]                   PATIENT TYPE:   LOCATION:                                       FACILITY:  [facility]   PHYSICIAN:  Edward L. Juanetta Gosling, M.D.             DATE OF BIRTH:  [birth year]   DATE OF PROCEDURE:  08/16/2002  DATE OF DISCHARGE:                                EKG INTERPRETATION   TIME AND DATE:  2005 on 08/15/02   INTERPRETATION:  The rhythm is sinus rhythm with a rate in the 60s.  There  are probable PVCs, but it is possible that this is an aberrant conducted  PAC.  There is a right bundle branch block.  Q waves are seen inferiorly.  This may be related to the bundle branch block and clinical correlation is  suggested.   IMPRESSION:  Abnormal electrocardiogram.                                               Edward L. Juanetta Gosling, M.D.    ELH/MEDQ  D:  08/16/2002  T:  08/16/2002  Job:  [id number]

## 2011-03-07 NOTE — Discharge Summary (Signed)
[name], Page             ACCOUNT NO.:  [id number]   MEDICAL RECORD NO.:  [id number]          PATIENT TYPE:  INP   LOCATION:  A226                          FACILITY:  [facility]   PHYSICIAN:  Madelin Rear. Sherwood Gambler, MD  DATE OF BIRTH:  14-Sep-1931   DATE OF ADMISSION:  07/23/2005  DATE OF DISCHARGE:  10/14/2006LH                                 DISCHARGE SUMMARY   DISCHARGE MEDICATIONS:  1.  Xopenex 1.25 mg nebulizer q.i.d.  2.  Ipratropium inhalation four times a day.  3.  Medrol dose pack.  4.  Advair 50/500 one puff b.i.d.  5.  Sliding scale regular insulin.  6.  Theo-Dur 300 mg p.o. b.i.d.   DISCHARGE DIAGNOSES:  1.  Severe exacerbation of chronic obstructive pulmonary disease with      respiratory insufficiency.  2.  Hypertension.  3.  Diabetes mellitus.  4.  Coronary artery disease.   HOSPITAL COURSE:  The patient was admitted with shortness of breath, cough  and congestion on July 23, 2005.  She had several weeks of progressively  increasing dyspnea that culminated in severe dyspnea presenting with  primarily shortness of breath.  She was admitted to the hospital maintained  on her usual medications, but intensive bronchodilator therapy and IV  aminophylline was instituted.  She responded well to treatment.  She was  also seen in consultation by pulmonary medicine who concurred with  management.  She had no evidence of pneumonia and was discharged back to her  baseline to follow up in the office in 1 week.      Madelin Rear. Sherwood Gambler, MD  Electronically Signed     LJF/MEDQ  D:  08/21/2005  T:  08/21/2005  Job:  [id number]

## 2011-03-07 NOTE — Op Note (Signed)
[facility]  Patient:    [name], [name] Visit Number: [id number] MRN: [id number]          Service Type: MED Location: 2A A201 01 Attending Physician:  Dalia Heading Dictated by:   Franky Macho, M.D. Proc. Date: 02/25/02 Admit Date:  02/21/2002 Discharge Date: 02/26/2002   CC:         Jonell Cluck, M.D.   Operative Report  AGE:  77 years old.  PREOPERATIVE DIAGNOSES:  Cholecystitis, cholelithiasis.  POSTOPERATIVE DIAGNOSES:  Cholecystitis, cholelithiasis.  PROCEDURE:  Laparoscopic cholecystectomy.  SURGEON:  Franky Macho, M.D.  ANESTHESIA:  General endotracheal.  INDICATIONS FOR PROCEDURE:  The patient is a 77 year old white female who presents with nausea and vomiting secondary to cholelithiasis/sludge. The risks and benefits of the procedure including bleeding, infection, hepatobiliary injury, and the possibility of an open procedure were fully explained to the patient, who gave informed consent.  DESCRIPTION OF PROCEDURE:  The patient was placed in the supine position. After induction of general endotracheal anesthesia, the abdomen was prepped and draped using the usual sterile technique with Betadine.  A supraumbilical incision was made down to the fascia. A Veress needle was introduced into the abdominal cavity and confirmation of placement was done using the saline drop test. The abdomen was then insufflated to 16 mmHg pressure. An 11 mm trocar was introduced into the abdominal cavity under direct visualization without difficulty. The patient was placed in reverse Trendelenburg position and an additional 11 mm trocar was placed at the gastric region and 5 mm trocars were placed in the right upper quadrant and right flank regions. The liver was inspected and noted to be within normal limits. The gallbladder was retracted superior and laterally. The dissection was begun around the infundibulum of the gallbladder. The cystic  duct was first identified. Its junction to the infundibulum fully identified. Endoclips were placed proximally and distally in the cystic duct and the cystic duct was divided. This was likewise done to the cystic artery. The gallbladder was then freed away from the gallbladder fossa using Bovie electrocautery. The gallbladder was delivered through the epigastric trocar site without difficulty. The gallbladder fossa was inspected and no abnormal bleeding or bile leakage was notes. Surgicel was placed in the gallbladder fossa. The subhepatic space as well as the right hepatic were irrigated with normal saline. All fluid and air were then evacuated from the abdominal cavity prior to removal of the trocars.  All wounds were irrigated with normal saline. All wounds were injected with 0.5% Marcaine. The supraumbilical fascia was reapproximated using an #0 Vicryl interrupted suture.  All skin incisions were closed using staples. Betadine ointment and dry sterile dressing were applied.  All tape and needle counts were correct at the end of the procedure. The patient was extubated in the operating room and went back to the recovery room awake in stable condition.  COMPLICATIONS:  None.  SPECIMEN:  Gallbladder.  ESTIMATED BLOOD LOSS:  Minimal. Dictated by:   Franky Macho, M.D. Attending Physician:  Dalia Heading DD:  02/25/02 TD:  02/28/02 Job: [id number]

## 2011-03-07 NOTE — Group Therapy Note (Signed)
NAMEKEANNA, TUGWELL             ACCOUNT NO.:  [id number]   MEDICAL RECORD NO.:  [id number]          PATIENT TYPE:  INP   LOCATION:  A226                          FACILITY:  [facility]   PHYSICIAN:  Edward L. Juanetta Gosling, M.D.DATE OF BIRTH:  02-Nov-1930   DATE OF PROCEDURE:  DATE OF DISCHARGE:                                   PROGRESS NOTE   A patient of Dr. Sherwood Gambler.   SUBJECTIVE:  Ms. Sasaki says she is better.  She has had a rash, which may  be from Mucomyst.  Otherwise, she is doing better and seems to have  improved.   OBJECTIVE:  VITAL SIGNS:  Her exam today shows that her temp is 97.3, pulse  94, respirations 20.  Blood sugar 217.  Blood pressure 136/74.  O2 sat is  94%.  CHEST:  She is much less congested.  Her chest is clearer.   ASSESSMENT:  She is overall better.   PLAN:  To continue treatments and try to get PT to get her up and moving  around.      Edward L. Juanetta Gosling, M.D.  Electronically Signed     ELH/MEDQ  D:  07/30/2005  T:  07/30/2005  Job:  [id number]

## 2011-03-07 NOTE — Consult Note (Signed)
[name], KJOS             ACCOUNT NO.:  [id number]   MEDICAL RECORD NO.:  [id number]          PATIENT TYPE:  INP   LOCATION:  A226                          FACILITY:  [facility]   PHYSICIAN:  Edward L. Juanetta Gosling, M.D.DATE OF BIRTH:  10/13/1931   DATE OF CONSULTATION:  07/25/2005  DATE OF DISCHARGE:                                   CONSULTATION   PRIMARY CARE PHYSICIAN:  Social research officer, government.   REASON FOR CONSULTATION:  COPD exacerbation.   HISTORY OF PRESENT ILLNESS:  This is a 77 year old who has a long known  history of COPD and who has had about a 2- to 3-week history of increasing  shortness of breath, cough and congestion.  Her cough really started about 4  days ago.  She has not had a great deal of fever or chills.  She has had  multiple other problems including coronary artery occlusive disease with an  acute myocardial infarction in February 1999.  At that time, she had a stent  in the right coronary artery placed.  She returned in 2001, and had a  atherectomy.  She has apparently had some mild congestive heart failure as  well.  She has been on home oxygen and home nebulizer.  She has had reduced  ejection fraction on Cardiolite stress test and it is not quite clear  exactly how much problem she has with presumed congestive heart failure.  She says that she has been more and more short of breath.  Even when she is  doing well, however, she is short of breath to the point that she is not  able to give herself a bath and wash her hair at the same time.  She is on  continuous O2.   PAST MEDICAL HISTORY:  1.  Hyperlipidemia.  2.  Diabetes.  3.  Hypertension.  4.  COPD.   MEDICATIONS:  1.  Albuterol nebulizer 3-4 times a day.  2.  Oxygen at 2 L per minute.  3.  Spiriva at home.   She does not have the rest of her medication list with her currently.  She  is going to get them.  From a previous chart, she has the following:  1.  Lipitor 5 mg daily.  2.   Metoprolol 50 mg 1/2 tablet b.i.d.  3.  Nexium 40 mg daily.  4.  Aspirin 81 mg daily.  5.  Glipizide 10 mg daily.  6.  Lasix 40 mg daily.  7.  Spironolactone 50 mg daily.  8.  Diovan 160 daily.   PAST SURGICAL HISTORY:  1.  C-section twice.  2.  Appendectomy.  3.  Cholecystectomy.  4.  Small bowel resection.  5.  Colonoscopy.  6.  EGD.   FAMILY HISTORY:  Her mother died in her 67s and it is not quite clear  exactly what from.  Her father died in his 71s with complications of  diabetes.   SOCIAL HISTORY:  She is retired.  She has about an 80-pack-year smoking  history.  She stopped about 6-7 years ago.  She does not use alcohol.  REVIEW OF SYSTEMS:  She is really not had a lot of edema of her extremities.   PHYSICAL EXAMINATION:  GENERAL:  A well-developed, well-nourished,  moderately obese female who is in mild acute distress with wheezing and she  has significant tremor, probably related to beta-adrenergic medications.  VITAL SIGNS:  Temperature 97, pulse 70, respirations 18, blood sugar 196, it  has been as high as 278, blood pressure 97/48.  O2 saturations 91% on 2 L.   LABORATORY DATA AND X-RAY FINDINGS:  Chest x-ray shows moderately enlarged  heart, no effusions and no pneumonia.   Theophylline level is 15.2.  CMP shows glucose 146, BUN 24.  Blood gas on 2  L with pO2 74, pCO2 44, pH 7.40.  Her CBC shows white count 7300, hemoglobin  12.4, platelets 193, neutrophil 71%.   ASSESSMENT:  She has chronic obstructive pulmonary disease.  She does not  have pneumonia.   RECOMMENDATIONS:  She is on treatments now which are certainly adequate.  She is set for an echocardiogram later today and would like to see if she  does have evidence of congestive heart failure.  In addition, I will go  ahead and order a B-type natriuretic peptide to see if that helps [place] at all.  She will need to have deep venous thrombosis prophylaxis if she is not  already getting that.       Edward L. Juanetta Gosling, M.D.  Electronically Signed     ELH/MEDQ  D:  07/25/2005  T:  07/25/2005  Job:  [id number]

## 2011-03-07 NOTE — Discharge Summary (Signed)
   NAME:  [name], [name]                       ACCOUNT NO.:  [id number]   MEDICAL RECORD NO.:  [id number]                   PATIENT TYPE:  INP   LOCATION:  A211                                 FACILITY:  [facility]   PHYSICIAN:  Corrie Mckusick, M.D.               DATE OF BIRTH:  1931/09/14   DATE OF ADMISSION:  09/21/2002  DATE OF DISCHARGE:  09/26/2002                                 DISCHARGE SUMMARY   HISTORY OF PRESENTING ILLNESS AND PAST MEDICAL HISTORY:  Please see  admission H&P.   HOSPITAL COURSE:  A 77 year old female with coronary artery disease, asthma,  hypertension, hyperlipidemia, and diabetes who presented with influenza  symptomatology.  It was felt like she had post influenza pneumonia,  community-acquired type.  She was placed on bronchodilators, steroids,  intravenous antibiotics.  The day after admission the patient felt  remarkably better.  The vital signs were stable.  She was afebrile.  IV  antibiotics and nebulizers were continued.  Chest CT was obtained as the D-  dimer ordered was 2.05.  We wanted to rule out pulmonary embolism.  CK-MB  and troponins were checked.   The day after admission the chest CT was obtained showing left lower lobe  pneumonia and no pulmonary embolism.  Cardiac enzymes were negative.   On September 24, 2002 the patient again felt quite well, afebrile, vital signs  stable.  IV antibiotics and nebulizer treatments were continued.   On September 25, 2002 the patient felt fantastic.  Medicines were changed to  p.o. and we worked toward discharge.  The following day Dr. Nobie Putnam saw the  patient and she was ready for discharge.   DISCHARGE MEDICATIONS:  1. Levaquin 500 daily for seven additional days.  2. Prednisone taper as discussed with the patient.   PHYSICAL EXAMINATION:  Please see day of discharge progress note by Dr.  Sherwood Gambler for physical exam.   DISCHARGE CONDITION:  Improved and stable.   FOLLOW-UP:  With [facility] as  directed.                                               Corrie Mckusick, M.D.    Flint Melter  D:  11/23/2002  T:  11/23/2002  Job:  [id number]

## 2011-03-07 NOTE — Group Therapy Note (Signed)
[name], [name]             ACCOUNT NO.:  [id number]   MEDICAL RECORD NO.:  [id number]          PATIENT TYPE:  INP   LOCATION:  A226                          FACILITY:  [facility]   PHYSICIAN:  Edward L. Juanetta Gosling, M.D.DATE OF BIRTH:  12-07-1930   DATE OF PROCEDURE:  07/29/2005  DATE OF DISCHARGE:                                   PROGRESS NOTE   SUBJECTIVE:  [name] says she is feeling better.  She is still coughing  and congestion, but not as bad.  She has no other new complaints.   OBJECTIVE:  VITAL SIGNS:  Her physical examination today shows temperature  96.1, pulse 93, respirations 20, blood pressure was 137/75, blood sugar 150,  but it has been as high as 307, O2 saturations 93% on 2 L.  CHEST:  She now does not have a great deal of wheezes, but more has rhonchi  centrally in her chest.   ASSESSMENT/PLAN:  She is not able to cough up any sputum and I have  suggested that we add Mucomyst to see if it will make a difference and  perhaps allow her to cough up some sputum. Otherwise, she does seem to be  improving.  I have discussed this with Dr. Nobie Page today and he agrees.      Edward L. Juanetta Gosling, M.D.  Electronically Signed     ELH/MEDQ  D:  07/29/2005  T:  07/29/2005  Job:  [id number]

## 2011-03-07 NOTE — Discharge Summary (Signed)
NAMERICHANDA, DARIN                       ACCOUNT NO.:  [id number]   MEDICAL RECORD NO.:  [id number]                   PATIENT TYPE:  INP   LOCATION:  A204                                 FACILITY:  [facility]   PHYSICIAN:  Corrie Mckusick, M.D.               DATE OF BIRTH:  08-Apr-1931   DATE OF ADMISSION:  09/24/2003  DATE OF DISCHARGE:  09/27/2003                                 DISCHARGE SUMMARY   DISCHARGE DIAGNOSIS:  Chest pain, ruled out cardiac.   HISTORY OF PRESENTING ILLNESS AND PAST MEDICAL HISTORY:  Please see  admission H&P.   HOSPITAL COURSE:  A 77 year old female with known coronary artery disease as  well as multiple comorbidities who presented with an episode of chest pain  which awoke her.  She was placed on nitroglycerin and became pain free.  She  subsequently ruled out and has done well since.  She sees [facility]  who is consulted on this.   She had a repeat adenosine Cardiolite which basically was unchanged from  January 2004.  Dr. Dorethea Clan felt like she was ready for discharge as well.  We  are going to work up her midsternal chest discomfort as an outpatient with  an EGD.  She has had Dr. Karilyn Cota perform this in the past.  She has had some  mild dysphagia in the past and has not been on a proton pump inhibitor.  She  has improved in the hospital while being on the proton pump inhibitor.   OBJECTIVE:  VITAL SIGNS:  Tmax 99.1, blood pressure 110/70, heart rate in  the 70s.  GENERAL:  Pleasant female who is looking quite well, sitting up, no acute  distress.  CHEST:  Clear to auscultation bilaterally.  CARDIOVASCULAR:  Regular rate and rhythm with no murmur.  ABDOMEN:  Soft, nontender.  EXTREMITIES:  No edema.   DISCHARGE MEDICATIONS:  Same as admission plus the addition of:  1. Nystatin swish and swallow q.i.d.  2. Aciphex 20 mg daily.   She is going to continue home health and we will set up an EGD as an  outpatient.  She wants to wait until after  Christmas to have this done.     ___________________________________________                                         Corrie Mckusick, M.D.   JCG/MEDQ  D:  09/27/2003  T:  09/27/2003  Job:  [id number]

## 2011-03-07 NOTE — Consult Note (Signed)
NAME:  [name], [name]                       ACCOUNT NO.:  [id number]   MEDICAL RECORD NO.:  [id number]                  PATIENT TYPE:   LOCATION:                                       FACILITY:  [facility]   PHYSICIAN:  Vida Roller, M.D.                DATE OF BIRTH:  [birth year]   DATE OF CONSULTATION:  DATE OF DISCHARGE:                                   CONSULTATION   PRIMARY CARE PHYSICIAN:  Patrica Duel, M.D.   CARDIOLOGIST:  Jesse Sans. Wall, M.D.   HISTORY OF PRESENT ILLNESS:  Mrs. Ehresman is a 77 year old woman with a  history of coronary artery disease, status post inferior wall myocardial  infarction with stents x3 in the right coronary artery and end-stent  restenosis in 2001, with an atherectomy at that time.  She has done well  since then, until she had the onset of discomfort in her chest last night  with radiation to her back.  She presented to the ER, was given some IV  nitroglycerin with resolution of the pain after both TUMS and sublingual  nitroglycerin did not help.  She has not had any pain since then.  She had  no diaphoresis, nausea or vomiting.  She states that she has been having  this episodic dyspepsia for the last couple of weeks, ever since she was  discharged from the hospital after treatment of pneumonia.  She thinks that  the antibiotic that she is on, is causing her to have dyspepsia and she is  concerned that that may be the cause of what is going on.  She states that  the discomfort in her chest is absolutely incompatible with her previous  coronary artery disease.   PREADMISSION MEDICATIONS:  1. Aspirin 81 mg a day.  2. Accupril 5 mg a day.  3. Advair one puff b.i.d.  4. Singulair 10 mg a day.  5. Lipitor 5 mg a day.  6. Demadex 20 mg a day.  7. Glipizide 10 mg three times a day.  8. K-Dur 20 mEq a day.   INHOSPITAL MEDICATIONS:  1. Prinvil 5 mg a day instead of the Accupril.  2. Nystatin swish and swallow, 30 mg three times a day.  3.  Nitroglycerin paste, 1/2 an inch.   PAST MEDICAL HISTORY:  Significant for:  1. Coronary artery disease, status post heart catheterization back in April,     2004, where her ejection fraction was 49% with no mitral regurgitation.     She has akinesis of her inferior wall, status post myocardial infarction.     She has a 30% left main, 25% distal left main, 25% proximal LAD, followed     by ectasia and another 25% stenosis.  She has diffuse disease throughout     her circumflex distribution which is nonobstructive.  She has mild end-     stent restenosis after an atherectomy and rotablation for a  previous 95%     end-stent restenosis in her right coronary artery.  2. Hyperlipidemia.  3. Diabetes mellitus.  4. Hypertension.  5. Asthma.   SOCIAL HISTORY:  She lives in [place] with her husband.  She is retired  from the [employer].  She is married, she has two children, both of whom  are healthy.  She used to smoke cigarettes but quit in 1999 after a heart  attack.  She does not drink alcohol, does not use any elicit drugs.   FAMILY HISTORY:  Her mother died at age 37 of unknown causes.  Father died  at age 27 of diabetes mellitus.  She has no siblings.   REVIEW OF SYSTEMS:  Noncontributory other than that mentioned in the History  of Present Illness.   PHYSICAL EXAMINATION:  GENERAL:  A well-developed, well-nourished,  moderately obese white female, weighing 211 pounds.  VITAL SIGNS:  Temperature 97.6, heart rate 93, respiratory rate 20, blood  pressure 116/73.  HEENT:  Unremarkable.  NECK:  Supple.  No jugular venous distention or carotid bruits.  CHEST:  Clear to auscultation with only decreased breath sounds at the  bases.  CARDIAC:  Reveals distant heart sounds, but a regular rate and rhythm with  no murmurs, rubs or gallops.  ABDOMEN:  Soft, nontender, normoactive bowel sounds.  GENITOURINARY:  Deferred.  RECTAL:  Deferred.  EXTREMITIES:  Without significant clubbing,  cyanosis or edema.  NEUROLOGIC:  Nonfocal.   STUDIES:  Chest x-ray shows cardiomegaly with bronchitis, but no overt  congestive heart failure.  Electrocardiogram shows sinus rhythm with a right  bundle branch block at a rate of 91 with a left axis deviation.  Normal  intervals with the exception of her QRS duration which is 137 msec's.  She  has Q-waves in the inferior and anterolateral leads, diffuse ST-T wave  changes, consistent with a right bundle branch block.  I have no old EKG's  to compare, but it sounds like her previous one, previously described.   LABORATORY DATA:  White blood cell count 6.0, H&H of 13 and 40 with a  platelet count of 134,000.  Sodium 139, potassium 4.0, chloride 99,  bicarbonate 30, BUN 25, creatinine 1.3 and blood glucose 159.  Liver  function studies were normal.  Total protein 6.0 with an albumin of 3.3.  Two sets of cardiac enzymes were absolutely not consistent with acute  myocardial infarction.  PTT 26, PT 0.9.   IMPRESSION:  This is a woman with known coronary artery disease with  atypical chest discomfort, who has two cardiac enzymes which are negative.  We are awaiting the third.  Her electrocardiogram is abnormal, as is her  left ventricular systolic function, but she has no history of heart failure.  She has diabetes which is reasonably well-controlled on medications,  hypolipidemia, asthma and a recent history of pneumonia, on no medications  currently for that.   PLAN:  It is my plan to get an Adenosine Cardiolite scheduled and I will  probably end up doing a Dobutamine Cardiolite because of her reactive airway  disease.  Will complete a rule out and I think she probably needs a GI  evaluation.  Obviously, if the Cardiolite reveals that she has significant  obstructive coronary disease, then she may need a left heart  catheterization.  If on the other hand, there is no obvious evidence of ischemia, then aggressive treatment for reflux is  probably reasonable.      ___________________________________________  Vida Roller, M.D.   JH/MEDQ  D:  09/25/2003  T:  09/26/2003  Job:  [id number]

## 2011-03-07 NOTE — Discharge Summary (Signed)
NAME:  [name], [name]                       ACCOUNT NO.:  [id number]   MEDICAL RECORD NO.:  [id number]                   PATIENT TYPE:  INP   LOCATION:  A219                                 FACILITY:  [facility]   PHYSICIAN:  Corrie Mckusick, M.D.               DATE OF BIRTH:  Aug 31, 1931   DATE OF ADMISSION:  08/31/2003  DATE OF DISCHARGE:  09/07/2003                                 DISCHARGE SUMMARY   HISTORY OF PRESENTING ILLNESS AND PAST MEDICAL HISTORY:  Please see  admission H&P.   HOSPITAL COURSE:  A 77 year old female well known to the practice with  longstanding history of diabetes, hyperlipidemia, hypertension, coronary  artery disease, and asthma who presented with exacerbation of her asthma.  She was admitted for aggressive nebulizer treatments as well as IV  antibiotics and prednisone.  CT of the chest was also set up.  Cardiac  enzymes were obtained as well.   The day after admission Dr. Nobie Putnam saw the patient and she continued to do  remarkably well.  I began vacation so my partners at that point saw the  patient.  This dictation is done from their notes.  Diuresis was continued.   She was changed to p.o. prednisone on November 14; antibiotics were  continued.  The patient began to slowly improve each day and she slowly made  the transition to p.o. medications.  She was ready for discharge on September 07, 2003.  Please see that note for discharge physical.  Discharge  medications are as seen on the discharge sheet.  To follow up with Dr.  Nobie Putnam in one to two weeks.   DISCHARGE CONDITION:  Stable per the progress notes.     ___________________________________________                                         Corrie Mckusick, M.D.   JCG/MEDQ  D:  09/27/2003  T:  09/27/2003  Job:  [id number]

## 2011-03-07 NOTE — H&P (Signed)
NAMEFEATHER, BERRIE                       ACCOUNT NO.:  [id number]   MEDICAL RECORD NO.:  [id number]                   PATIENT TYPE:   LOCATION:                                       FACILITY:  [facility]   PHYSICIAN:  Madelin Rear. Sherwood Gambler, M.D.             DATE OF BIRTH:  [birth year]   DATE OF ADMISSION:  09/21/2002  DATE OF DISCHARGE:                                HISTORY & PHYSICAL   CHIEF COMPLAINT:  Shortness of breath, fever, and chills.   HISTORY OF PRESENT ILLNESS:  The patient was in the office on September 19, 2002 (two days prior to admission) with fever, chills, myalgias, and typical  flu symptomatology.  She was placed on Tamiflu at that time.  In spite of  her being compliant with medication, her symptoms progressed.  She is now  complaining of severe, unrelenting left pleuritic sided chest pain with a  productive cough.  She had true rigors, fever, and chills at home.  Mild  nausea, but no vomiting.  No hematemesis, hematochezia, or melena.   PAST MEDICAL HISTORY:  1. Coronary artery disease.  2. Asthma.  3. Hypertension.  4. Hyperlipidemia.  5. Diabetes mellitus, non-insulin-dependent.  6. She is status post multiple cesarean sections.  7. Status post bowel obstruction.  8. Status post coronary artery stenting.   SOCIAL HISTORY:  She denies cigarette smoking.  No alcohol use.  She is  currently retired.   FAMILY HISTORY:  Notable for brothers and sisters all in good health and two  children that are in good health.  No predominant inherited disorders noted.   REVIEW OF SYSTEMS:  As under HPI.  All else is negative.   PHYSICAL EXAMINATION:  GENERAL:  She appears acutely tachypneic with a  respiratory rate of about 30 in the office.  LUNGS:  She has audible wheezing and scattered rhonchi in all fields and  rales in the left lower lobe.  NECK:  No JVD or adenopathy.  CARDIAC:  Regular rhythm without murmur, rub, or gallop.  ABDOMEN:  Soft.  No organomegaly or  masses.  EXTREMITIES:  Without clubbing, cyanosis, edema.  NEUROLOGIC:  Nonfocal.    IMPRESSION:  The patient has severe flu symptomatology complicated by post  influenza pneumonia community acquired type.  The patient will be admitted,  placed on intravenous bronchodilators, steroids, monitoring of her blood  sugars due to her history of diabetes, and pending blood cultures will  dictate antibiotic therapy changes.  She is currently stable.  Blood work is  pending at the time of dictation, will be reviewed when available.                                               Madelin Rear. Sherwood Gambler, M.D.    LJF/MEDQ  D:  09/21/2002  T:  09/21/2002  Job:  [id number]

## 2011-03-07 NOTE — Group Therapy Note (Signed)
[name], [name]             ACCOUNT NO.:  [id number]   MEDICAL RECORD NO.:  [id number]          PATIENT TYPE:  INP   LOCATION:  A226                          FACILITY:  [facility]   PHYSICIAN:  Edward L. Juanetta Gosling, M.D.DATE OF BIRTH:  01-11-1931   DATE OF PROCEDURE:  07/27/2005  DATE OF DISCHARGE:                                   PROGRESS NOTE   Patient of Dr. Sherwood Page.  Patient says she feels better. She is concerned  because her husband is sick at home with an upper respiratory infection. She  says that she is better. However.   OBJECTIVE:  Her physical examination today shows that she does look more  comfortable. Her temperature is 96.9, pulse 106, respirations 22, blood  sugar in the 200-300s.  Blood pressure 114/58, O2 saturation 92% on 2  liters.  Her chest is clear with still some wheezes but clearer than she has  been.  She is moving air better.   ASSESSMENT:  I think she is improving, to some extent plan.   PLAN:  To continue with her medications and treatments.  No changes today.      Edward L. Juanetta Gosling, M.D.  Electronically Signed     ELH/MEDQ  D:  07/27/2005  T:  07/28/2005  Job:  [id number]

## 2011-06-24 ENCOUNTER — Other Ambulatory Visit (HOSPITAL_COMMUNITY): Payer: Self-pay | Admitting: Internal Medicine

## 2011-06-24 DIAGNOSIS — Z139 Encounter for screening, unspecified: Secondary | ICD-10-CM

## 2011-08-05 ENCOUNTER — Ambulatory Visit (HOSPITAL_COMMUNITY)
Admission: RE | Admit: 2011-08-05 | Discharge: 2011-08-05 | Disposition: A | Discharge status: Home / Self Care | Payer: Medicare Other | Source: Ambulatory Visit | Attending: Internal Medicine | Admitting: Internal Medicine

## 2011-08-05 DIAGNOSIS — Z1231 Encounter for screening mammogram for malignant neoplasm of breast: Secondary | ICD-10-CM | POA: Insufficient documentation

## 2011-08-05 DIAGNOSIS — Z139 Encounter for screening, unspecified: Secondary | ICD-10-CM

## 2011-08-07 LAB — BASIC METABOLIC PANEL
BUN: 15
Chloride: 101
Creatinine, Ser: 0.95
GFR calc non Af Amer: 58 — ABNORMAL LOW
Potassium: 3.9
Sodium: 140

## 2011-08-07 LAB — HEMOGLOBIN AND HEMATOCRIT, BLOOD: HCT: 36.8

## 2011-11-25 DIAGNOSIS — D649 Anemia, unspecified: Secondary | ICD-10-CM | POA: Diagnosis not present

## 2011-11-25 DIAGNOSIS — I10 Essential (primary) hypertension: Secondary | ICD-10-CM | POA: Diagnosis not present

## 2011-11-25 DIAGNOSIS — D539 Nutritional anemia, unspecified: Secondary | ICD-10-CM | POA: Diagnosis not present

## 2011-11-25 DIAGNOSIS — I259 Chronic ischemic heart disease, unspecified: Secondary | ICD-10-CM | POA: Diagnosis not present

## 2011-11-25 DIAGNOSIS — R5381 Other malaise: Secondary | ICD-10-CM | POA: Diagnosis not present

## 2011-12-01 DIAGNOSIS — Z1211 Encounter for screening for malignant neoplasm of colon: Secondary | ICD-10-CM | POA: Diagnosis not present

## 2011-12-01 DIAGNOSIS — E782 Mixed hyperlipidemia: Secondary | ICD-10-CM | POA: Diagnosis not present

## 2011-12-01 DIAGNOSIS — I10 Essential (primary) hypertension: Secondary | ICD-10-CM | POA: Diagnosis not present

## 2011-12-01 DIAGNOSIS — I2510 Atherosclerotic heart disease of native coronary artery without angina pectoris: Secondary | ICD-10-CM | POA: Diagnosis not present

## 2011-12-10 DIAGNOSIS — H353190 Nonexudative age-related macular degeneration, unspecified eye, stage unspecified: Secondary | ICD-10-CM | POA: Diagnosis not present

## 2012-01-26 ENCOUNTER — Ambulatory Visit: Payer: Medicare Other | Admitting: Cardiology

## 2012-01-26 ENCOUNTER — Ambulatory Visit (INDEPENDENT_AMBULATORY_CARE_PROVIDER_SITE_OTHER): Payer: Medicare Other | Admitting: Cardiology

## 2012-01-26 ENCOUNTER — Encounter: Payer: Self-pay | Admitting: Cardiology

## 2012-01-26 VITALS — BP 100/64 | HR 58 | Resp 16 | Ht 64.0 in | Wt 197.0 lb

## 2012-01-26 DIAGNOSIS — I452 Bifascicular block: Secondary | ICD-10-CM

## 2012-01-26 DIAGNOSIS — E785 Hyperlipidemia, unspecified: Secondary | ICD-10-CM | POA: Diagnosis not present

## 2012-01-26 DIAGNOSIS — I10 Essential (primary) hypertension: Secondary | ICD-10-CM

## 2012-01-26 DIAGNOSIS — I2510 Atherosclerotic heart disease of native coronary artery without angina pectoris: Secondary | ICD-10-CM

## 2012-01-26 DIAGNOSIS — E119 Type 2 diabetes mellitus without complications: Secondary | ICD-10-CM | POA: Diagnosis not present

## 2012-01-26 NOTE — Progress Notes (Signed)
HPI [name] comes in today for followup evaluation of her history of coronary artery disease and myocardial infarction.  She lost her husband about a year and a half ago. He is very lonely. She still lives alone but has great neighbors.  She's wearing oxygen 24 hours a day. She denies any shortness of breath, chest pain or angina. She's had no palpitations or presyncope. She is very compliant with her medications.  Past Medical History  Diagnosis Date  . Asthma   . Coronary artery disease   . Hypertension   . Hyperlipidemia   . Diabetes mellitus     Current Outpatient Prescriptions  Medication Sig Dispense Refill  . aspirin 81 MG tablet Take 81 mg by mouth daily.        Marland Kitchen atorvastatin (LIPITOR) 10 MG tablet Take 10 mg by mouth daily. Take 1/2 tab daily      . diazepam (VALIUM) 5 MG tablet Take 1 tablet (5 mg total) by mouth at bedtime as needed for anxiety.  30 tablet  0  . fish oil-omega-3 fatty acids 1000 MG capsule Take 2 g by mouth daily.        . furosemide (LASIX) 40 MG tablet Take 40 mg by mouth daily.        Marland Kitchen glipiZIDE (GLUCOTROL) 5 MG tablet Take 5 mg by mouth daily.       Marland Kitchen ipratropium-albuterol (DUONEB) 0.5-2.5 (3) MG/3ML SOLN Take 3 mLs by nebulization as needed.        . isosorbide mononitrate (IMDUR) 30 MG 24 hr tablet Take 30 mg by mouth daily.        Marland Kitchen losartan (COZAAR) 50 MG tablet Take 50 mg by mouth daily.        . metoprolol (LOPRESSOR) 50 MG tablet Take 50 mg by mouth 2 (two) times daily. Take 1/2 tab bid        . niacin (NIASPAN) 500 MG CR tablet Take 500 mg by mouth at bedtime.        Marland Kitchen omeprazole (PRILOSEC) 20 MG capsule Take 20 mg by mouth daily.        . valsartan (DIOVAN) 160 MG tablet Take 160 mg by mouth daily.          Allergies  Allergen Reactions  . Codeine     No family history on file.  History   Social History  . Marital Status: Widowed    Spouse Name: N/A    Number of Children: N/A  . Years of Education: N/A   Occupational  History  . Not on file.   Social History Main Topics  . Smoking status: Former Smoker -- 2.0 packs/day for 40 years    Types: Cigarettes    Quit date: 01/13/1998  . Smokeless tobacco: Never Used  . Alcohol Use: No  . Drug Use: No  . Sexually Active: Not on file   Other Topics Concern  . Not on file   Social History Narrative  . No narrative on file    ROS ALL NEGATIVE EXCEPT THOSE NOTED IN HPI  PE  General Appearance: well developed, well nourished in no acute distress, chronically ill HEENT: symmetrical face, PERRLA, good dentition  Neck: no JVD, thyromegaly, or adenopathy, trachea midline Chest: symmetric without deformity Cardiac: PMI non-displaced, RRR, normal S1, S2, no gallop or murmur Lung: clear to ausculation and percussion Vascular: No obvious bruits Abdominal: nondistended, nontender, good bowel sounds, no HSM, no bruits Extremities: no cyanosis, clubbing or edema, no  sign of DVT, no varicosities  Skin: normal color, no rashes Neuro: alert and oriented x 3, non-focal Pysch: normal affect  EKG Sinus bradycardia, rate 58 beats per minute, old right bundle branch block and old inferior [name] infarct pattern, no significant change from previous study. BMET    Component Value Date/Time   NA 140 04/01/2007 1307   K 3.9 04/01/2007 1307   CL 101 04/01/2007 1307   CO2 31 04/01/2007 1307   GLUCOSE 127* 04/01/2007 1307   BUN 15 04/01/2007 1307   CREATININE 0.95 04/01/2007 1307   CALCIUM 9.3 04/01/2007 1307   GFRNONAA 58* 04/01/2007 1307   GFRAA  Value: >60        The eGFR has been calculated using the MDRD equation. This calculation has not been validated in all clinical 04/01/2007 1307    Lipid Panel  No results found for this basename: chol, trig, hdl, cholhdl, vldl, ldlcalc    CBC    Component Value Date/Time   HGB 12.6 04/01/2007 1307   HCT 36.8 04/01/2007 1307

## 2012-01-26 NOTE — Patient Instructions (Signed)
**Note De-identified  Obfuscation** Your physician recommends that you continue on your current medications as directed. Please refer to the Current Medication list given to you today.  Your physician recommends that you schedule a follow-up appointment in: 1 year  

## 2012-01-26 NOTE — Assessment & Plan Note (Signed)
Stable. No change in secondary preventative therapy. I'll see back in the office in a year. Blood work is followed Dr. Margo Aye

## 2012-02-23 DIAGNOSIS — R5383 Other fatigue: Secondary | ICD-10-CM | POA: Diagnosis not present

## 2012-02-23 DIAGNOSIS — R5381 Other malaise: Secondary | ICD-10-CM | POA: Diagnosis not present

## 2012-02-23 DIAGNOSIS — D649 Anemia, unspecified: Secondary | ICD-10-CM | POA: Diagnosis not present

## 2012-02-23 DIAGNOSIS — E119 Type 2 diabetes mellitus without complications: Secondary | ICD-10-CM | POA: Diagnosis not present

## 2012-02-23 DIAGNOSIS — E559 Vitamin D deficiency, unspecified: Secondary | ICD-10-CM | POA: Diagnosis not present

## 2012-02-23 DIAGNOSIS — I10 Essential (primary) hypertension: Secondary | ICD-10-CM | POA: Diagnosis not present

## 2012-03-08 DIAGNOSIS — K5289 Other specified noninfective gastroenteritis and colitis: Secondary | ICD-10-CM | POA: Diagnosis not present

## 2012-04-01 DIAGNOSIS — E119 Type 2 diabetes mellitus without complications: Secondary | ICD-10-CM | POA: Diagnosis not present

## 2012-04-01 DIAGNOSIS — H5200 Hypermetropia, unspecified eye: Secondary | ICD-10-CM | POA: Diagnosis not present

## 2012-04-01 DIAGNOSIS — H353190 Nonexudative age-related macular degeneration, unspecified eye, stage unspecified: Secondary | ICD-10-CM | POA: Diagnosis not present

## 2012-04-01 DIAGNOSIS — H26499 Other secondary cataract, unspecified eye: Secondary | ICD-10-CM | POA: Diagnosis not present

## 2012-04-27 DIAGNOSIS — G4700 Insomnia, unspecified: Secondary | ICD-10-CM | POA: Diagnosis not present

## 2012-04-27 DIAGNOSIS — R002 Palpitations: Secondary | ICD-10-CM | POA: Diagnosis not present

## 2012-05-10 ENCOUNTER — Ambulatory Visit (INDEPENDENT_AMBULATORY_CARE_PROVIDER_SITE_OTHER): Payer: Medicare Other | Admitting: Cardiology

## 2012-05-10 ENCOUNTER — Encounter: Payer: Self-pay | Admitting: Cardiology

## 2012-05-10 VITALS — BP 128/76 | HR 56 | Ht 64.0 in | Wt 195.1 lb

## 2012-05-10 DIAGNOSIS — I10 Essential (primary) hypertension: Secondary | ICD-10-CM

## 2012-05-10 DIAGNOSIS — I2510 Atherosclerotic heart disease of native coronary artery without angina pectoris: Secondary | ICD-10-CM | POA: Diagnosis not present

## 2012-05-10 DIAGNOSIS — I452 Bifascicular block: Secondary | ICD-10-CM

## 2012-05-10 DIAGNOSIS — E785 Hyperlipidemia, unspecified: Secondary | ICD-10-CM | POA: Diagnosis not present

## 2012-05-10 NOTE — Progress Notes (Signed)
Mrs. Rickert comes in today because of daily abdominal flutters like butterflies. Since starting Ativan at night, she's had no further episodes. She thinks they were panic attacks. She did become a little short of breath with them but otherwise no symptoms.  She was advised by Dr. Scharlene Gloss office to see [place] for potential monitor. She has not had these now for almost 3 weeks.  Her heart rhythm is regular today.  I advised her to report to [place] if she has any further episodes, particularly on a regular basis. At that point and monitor would be helpful in picking up a potential atrial arrhythmia.  No charge was given.

## 2012-05-10 NOTE — Patient Instructions (Addendum)
Your physician recommends that you continue on your current medications as directed. Please refer to the Current Medication list given to you today.   Your physician wants you to follow-up in: 1 year with Dr. Wall. You will receive a reminder letter in the mail two months in advance. If you don't receive a letter, please call our office to schedule the follow-up appointment.  

## 2012-05-18 DIAGNOSIS — F329 Major depressive disorder, single episode, unspecified: Secondary | ICD-10-CM | POA: Diagnosis not present

## 2012-05-18 DIAGNOSIS — E119 Type 2 diabetes mellitus without complications: Secondary | ICD-10-CM | POA: Diagnosis not present

## 2012-08-17 DIAGNOSIS — Z23 Encounter for immunization: Secondary | ICD-10-CM | POA: Diagnosis not present

## 2012-08-17 DIAGNOSIS — I498 Other specified cardiac arrhythmias: Secondary | ICD-10-CM | POA: Diagnosis not present

## 2012-08-17 DIAGNOSIS — E119 Type 2 diabetes mellitus without complications: Secondary | ICD-10-CM | POA: Diagnosis not present

## 2012-08-17 DIAGNOSIS — R5381 Other malaise: Secondary | ICD-10-CM | POA: Diagnosis not present

## 2012-08-17 DIAGNOSIS — E559 Vitamin D deficiency, unspecified: Secondary | ICD-10-CM | POA: Diagnosis not present

## 2012-08-17 DIAGNOSIS — I10 Essential (primary) hypertension: Secondary | ICD-10-CM | POA: Diagnosis not present

## 2012-08-17 DIAGNOSIS — E785 Hyperlipidemia, unspecified: Secondary | ICD-10-CM | POA: Diagnosis not present

## 2012-08-31 DIAGNOSIS — F329 Major depressive disorder, single episode, unspecified: Secondary | ICD-10-CM | POA: Diagnosis not present

## 2012-08-31 DIAGNOSIS — I498 Other specified cardiac arrhythmias: Secondary | ICD-10-CM | POA: Diagnosis not present

## 2012-08-31 DIAGNOSIS — I10 Essential (primary) hypertension: Secondary | ICD-10-CM | POA: Diagnosis not present

## 2012-08-31 DIAGNOSIS — E559 Vitamin D deficiency, unspecified: Secondary | ICD-10-CM | POA: Diagnosis not present

## 2012-09-22 DIAGNOSIS — J209 Acute bronchitis, unspecified: Secondary | ICD-10-CM | POA: Diagnosis not present

## 2012-10-21 DIAGNOSIS — F329 Major depressive disorder, single episode, unspecified: Secondary | ICD-10-CM | POA: Diagnosis not present

## 2012-10-21 DIAGNOSIS — L0390 Cellulitis, unspecified: Secondary | ICD-10-CM | POA: Diagnosis not present

## 2012-11-23 DIAGNOSIS — I10 Essential (primary) hypertension: Secondary | ICD-10-CM | POA: Diagnosis not present

## 2012-11-23 DIAGNOSIS — E559 Vitamin D deficiency, unspecified: Secondary | ICD-10-CM | POA: Diagnosis not present

## 2012-11-23 DIAGNOSIS — J449 Chronic obstructive pulmonary disease, unspecified: Secondary | ICD-10-CM | POA: Diagnosis not present

## 2012-11-23 DIAGNOSIS — E119 Type 2 diabetes mellitus without complications: Secondary | ICD-10-CM | POA: Diagnosis not present

## 2012-12-08 DIAGNOSIS — H35369 Drusen (degenerative) of macula, unspecified eye: Secondary | ICD-10-CM | POA: Diagnosis not present

## 2012-12-08 DIAGNOSIS — H35379 Puckering of macula, unspecified eye: Secondary | ICD-10-CM | POA: Diagnosis not present

## 2013-01-17 DIAGNOSIS — I10 Essential (primary) hypertension: Secondary | ICD-10-CM | POA: Diagnosis not present

## 2013-01-17 DIAGNOSIS — L0291 Cutaneous abscess, unspecified: Secondary | ICD-10-CM | POA: Diagnosis not present

## 2013-03-02 DIAGNOSIS — E119 Type 2 diabetes mellitus without complications: Secondary | ICD-10-CM | POA: Diagnosis not present

## 2013-03-02 DIAGNOSIS — J449 Chronic obstructive pulmonary disease, unspecified: Secondary | ICD-10-CM | POA: Diagnosis not present

## 2013-03-02 DIAGNOSIS — E785 Hyperlipidemia, unspecified: Secondary | ICD-10-CM | POA: Diagnosis not present

## 2013-03-02 DIAGNOSIS — I10 Essential (primary) hypertension: Secondary | ICD-10-CM | POA: Diagnosis not present

## 2013-03-02 DIAGNOSIS — Z0000 Encounter for general adult medical examination without abnormal findings: Secondary | ICD-10-CM | POA: Diagnosis not present

## 2013-03-02 DIAGNOSIS — E782 Mixed hyperlipidemia: Secondary | ICD-10-CM | POA: Diagnosis not present

## 2013-03-22 ENCOUNTER — Ambulatory Visit (INDEPENDENT_AMBULATORY_CARE_PROVIDER_SITE_OTHER): Payer: Medicare Other | Admitting: Cardiology

## 2013-03-22 ENCOUNTER — Encounter: Payer: Self-pay | Admitting: Cardiology

## 2013-03-22 VITALS — BP 124/78 | HR 71 | Ht 64.0 in | Wt 202.0 lb

## 2013-03-22 DIAGNOSIS — I2510 Atherosclerotic heart disease of native coronary artery without angina pectoris: Secondary | ICD-10-CM

## 2013-03-22 DIAGNOSIS — I452 Bifascicular block: Secondary | ICD-10-CM

## 2013-03-22 DIAGNOSIS — I10 Essential (primary) hypertension: Secondary | ICD-10-CM | POA: Diagnosis not present

## 2013-03-22 NOTE — Assessment & Plan Note (Signed)
Stable. Continue current medical therapy. Return the office in one year with Dr. Diona Browner.

## 2013-03-22 NOTE — Patient Instructions (Signed)
Your physician wants you to follow-up in: 1 YEAR with Dr Diona Browner.  You will receive a reminder letter in the mail two months in advance. If you don't receive a letter, please call our office to schedule the follow-up appointment.  Your physician recommends that you continue on your current medications as directed. Please refer to the Current Medication list given to you today.

## 2013-03-22 NOTE — Progress Notes (Signed)
HPI [name] returns today for evaluation and management coronary artery disease. She is O2 dependent from her COPD. She's been a widow now for 3 years.  She remains independent. She denies any chest pain or angina. She is compliant with her medications.  Past Medical History  Diagnosis Date  . Asthma   . Coronary artery disease   . Hypertension   . Hyperlipidemia   . Diabetes mellitus     Current Outpatient Prescriptions  Medication Sig Dispense Refill  . aspirin 81 MG tablet Take 81 mg by mouth daily.        Marland Kitchen atorvastatin (LIPITOR) 10 MG tablet Take 10 mg by mouth daily. Take 1/2 tab daily      . escitalopram (LEXAPRO) 20 MG tablet Take 20 mg by mouth daily.       . fish oil-omega-3 fatty acids 1000 MG capsule Take 2 g by mouth daily.        . furosemide (LASIX) 40 MG tablet Take 40 mg by mouth daily.        Marland Kitchen glipiZIDE (GLUCOTROL) 5 MG tablet Take 5 mg by mouth daily.       Marland Kitchen ipratropium-albuterol (DUONEB) 0.5-2.5 (3) MG/3ML SOLN Take 3 mLs by nebulization as needed.        . isosorbide mononitrate (IMDUR) 30 MG 24 hr tablet Take 30 mg by mouth daily.        Marland Kitchen LORazepam (ATIVAN) 1 MG tablet Take 0.5 mg by mouth daily.       Marland Kitchen losartan (COZAAR) 50 MG tablet Take 25 mg by mouth daily.       . niacin (NIASPAN) 500 MG CR tablet Take 500 mg by mouth at bedtime.        . NON FORMULARY Pt on 2.5 liters of O2 daily      . omeprazole (PRILOSEC) 20 MG capsule Take 20 mg by mouth daily.        . valsartan (DIOVAN) 160 MG tablet Take 160 mg by mouth daily.         No current facility-administered medications for this visit.    Allergies  Allergen Reactions  . Codeine     History reviewed. No pertinent family history.  History   Social History  . Marital Status: Widowed    Spouse Name: N/A    Number of Children: N/A  . Years of Education: N/A   Occupational History  . Not on file.   Social History Main Topics  . Smoking status: Former Smoker -- 2.00 packs/day for 40  years    Types: Cigarettes    Quit date: 01/13/1998  . Smokeless tobacco: Never Used  . Alcohol Use: No  . Drug Use: No  . Sexually Active: Not on file   Other Topics Concern  . Not on file   Social History Narrative  . No narrative on file    ROS ALL NEGATIVE EXCEPT THOSE NOTED IN HPI  PE  General Appearance: well developed, well nourished in no acute distress, overweight HEENT: symmetrical face, PERRLA, good dentition  Neck: no JVD, thyromegaly, or adenopathy, trachea midline Chest: symmetric without deformity Cardiac: PMI non-displaced, RRR, normal S1, S2, no gallop or murmur Lung: clear to ausculation and percussion Vascular: all pulses full without bruits  Abdominal: nondistended, nontender, good bowel sounds, no HSM, no bruits Extremities: no cyanosis, clubbing or edema, no sign of DVT, no varicosities  Skin: normal color, no rashes Neuro: alert and oriented x 3, non-focal Pysch: normal  affect  EK normal sinus rhythm, right bundle branch block, low-voltage G  BMET    Component Value Date/Time   NA 140 04/01/2007 1307   K 3.9 04/01/2007 1307   CL 101 04/01/2007 1307   CO2 31 04/01/2007 1307   GLUCOSE 127* 04/01/2007 1307   BUN 15 04/01/2007 1307   CREATININE 0.95 04/01/2007 1307   CALCIUM 9.3 04/01/2007 1307   GFRNONAA 58* 04/01/2007 1307   GFRAA  Value: >60        The eGFR has been calculated using the MDRD equation. This calculation has not been validated in all clinical 04/01/2007 1307    Lipid Panel  No results found for this basename: chol, trig, hdl, cholhdl, vldl, ldlcalc    CBC    Component Value Date/Time   HGB 12.6 04/01/2007 1307   HCT 36.8 04/01/2007 1307

## 2013-03-22 NOTE — Assessment & Plan Note (Signed)
Stable. Left anterior fascicular block is not there today.

## 2013-05-17 DIAGNOSIS — H353290 Exudative age-related macular degeneration, unspecified eye, stage unspecified: Secondary | ICD-10-CM | POA: Diagnosis not present

## 2013-05-18 DIAGNOSIS — H353290 Exudative age-related macular degeneration, unspecified eye, stage unspecified: Secondary | ICD-10-CM | POA: Diagnosis not present

## 2013-05-18 DIAGNOSIS — H353190 Nonexudative age-related macular degeneration, unspecified eye, stage unspecified: Secondary | ICD-10-CM | POA: Diagnosis not present

## 2013-05-18 DIAGNOSIS — Z961 Presence of intraocular lens: Secondary | ICD-10-CM | POA: Diagnosis not present

## 2013-05-26 DIAGNOSIS — E119 Type 2 diabetes mellitus without complications: Secondary | ICD-10-CM | POA: Diagnosis not present

## 2013-05-26 DIAGNOSIS — E782 Mixed hyperlipidemia: Secondary | ICD-10-CM | POA: Diagnosis not present

## 2013-05-26 DIAGNOSIS — J449 Chronic obstructive pulmonary disease, unspecified: Secondary | ICD-10-CM | POA: Diagnosis not present

## 2013-05-26 DIAGNOSIS — I10 Essential (primary) hypertension: Secondary | ICD-10-CM | POA: Diagnosis not present

## 2013-06-03 DIAGNOSIS — J449 Chronic obstructive pulmonary disease, unspecified: Secondary | ICD-10-CM | POA: Diagnosis not present

## 2013-06-03 DIAGNOSIS — E785 Hyperlipidemia, unspecified: Secondary | ICD-10-CM | POA: Diagnosis not present

## 2013-06-03 DIAGNOSIS — E119 Type 2 diabetes mellitus without complications: Secondary | ICD-10-CM | POA: Diagnosis not present

## 2013-06-03 DIAGNOSIS — E559 Vitamin D deficiency, unspecified: Secondary | ICD-10-CM | POA: Diagnosis not present

## 2013-06-03 DIAGNOSIS — I10 Essential (primary) hypertension: Secondary | ICD-10-CM | POA: Diagnosis not present

## 2013-06-16 DIAGNOSIS — H353290 Exudative age-related macular degeneration, unspecified eye, stage unspecified: Secondary | ICD-10-CM | POA: Diagnosis not present

## 2013-06-16 DIAGNOSIS — H353190 Nonexudative age-related macular degeneration, unspecified eye, stage unspecified: Secondary | ICD-10-CM | POA: Diagnosis not present

## 2013-06-16 DIAGNOSIS — Z961 Presence of intraocular lens: Secondary | ICD-10-CM | POA: Diagnosis not present

## 2013-06-16 DIAGNOSIS — E119 Type 2 diabetes mellitus without complications: Secondary | ICD-10-CM | POA: Diagnosis not present

## 2013-07-14 DIAGNOSIS — Z961 Presence of intraocular lens: Secondary | ICD-10-CM | POA: Diagnosis not present

## 2013-07-14 DIAGNOSIS — E119 Type 2 diabetes mellitus without complications: Secondary | ICD-10-CM | POA: Diagnosis not present

## 2013-07-14 DIAGNOSIS — H353190 Nonexudative age-related macular degeneration, unspecified eye, stage unspecified: Secondary | ICD-10-CM | POA: Diagnosis not present

## 2013-07-14 DIAGNOSIS — H353290 Exudative age-related macular degeneration, unspecified eye, stage unspecified: Secondary | ICD-10-CM | POA: Diagnosis not present

## 2013-08-18 DIAGNOSIS — H353190 Nonexudative age-related macular degeneration, unspecified eye, stage unspecified: Secondary | ICD-10-CM | POA: Diagnosis not present

## 2013-08-18 DIAGNOSIS — E119 Type 2 diabetes mellitus without complications: Secondary | ICD-10-CM | POA: Diagnosis not present

## 2013-08-18 DIAGNOSIS — H353290 Exudative age-related macular degeneration, unspecified eye, stage unspecified: Secondary | ICD-10-CM | POA: Diagnosis not present

## 2013-08-18 DIAGNOSIS — Z961 Presence of intraocular lens: Secondary | ICD-10-CM | POA: Diagnosis not present

## 2013-09-22 DIAGNOSIS — H35059 Retinal neovascularization, unspecified, unspecified eye: Secondary | ICD-10-CM | POA: Diagnosis not present

## 2013-09-22 DIAGNOSIS — H353290 Exudative age-related macular degeneration, unspecified eye, stage unspecified: Secondary | ICD-10-CM | POA: Diagnosis not present

## 2013-09-26 DIAGNOSIS — E119 Type 2 diabetes mellitus without complications: Secondary | ICD-10-CM | POA: Diagnosis not present

## 2013-09-26 DIAGNOSIS — E782 Mixed hyperlipidemia: Secondary | ICD-10-CM | POA: Diagnosis not present

## 2013-09-26 DIAGNOSIS — E559 Vitamin D deficiency, unspecified: Secondary | ICD-10-CM | POA: Diagnosis not present

## 2013-09-26 DIAGNOSIS — D649 Anemia, unspecified: Secondary | ICD-10-CM | POA: Diagnosis not present

## 2013-09-26 DIAGNOSIS — I10 Essential (primary) hypertension: Secondary | ICD-10-CM | POA: Diagnosis not present

## 2013-09-29 DIAGNOSIS — E119 Type 2 diabetes mellitus without complications: Secondary | ICD-10-CM | POA: Diagnosis not present

## 2013-09-29 DIAGNOSIS — I10 Essential (primary) hypertension: Secondary | ICD-10-CM | POA: Diagnosis not present

## 2013-09-29 DIAGNOSIS — E782 Mixed hyperlipidemia: Secondary | ICD-10-CM | POA: Diagnosis not present

## 2013-09-29 DIAGNOSIS — J449 Chronic obstructive pulmonary disease, unspecified: Secondary | ICD-10-CM | POA: Diagnosis not present

## 2013-11-17 DIAGNOSIS — H353190 Nonexudative age-related macular degeneration, unspecified eye, stage unspecified: Secondary | ICD-10-CM | POA: Diagnosis not present

## 2013-11-17 DIAGNOSIS — Z961 Presence of intraocular lens: Secondary | ICD-10-CM | POA: Diagnosis not present

## 2013-11-17 DIAGNOSIS — H353290 Exudative age-related macular degeneration, unspecified eye, stage unspecified: Secondary | ICD-10-CM | POA: Diagnosis not present

## 2013-11-17 DIAGNOSIS — E119 Type 2 diabetes mellitus without complications: Secondary | ICD-10-CM | POA: Diagnosis not present

## 2014-01-26 DIAGNOSIS — H353190 Nonexudative age-related macular degeneration, unspecified eye, stage unspecified: Secondary | ICD-10-CM | POA: Diagnosis not present

## 2014-01-26 DIAGNOSIS — H353290 Exudative age-related macular degeneration, unspecified eye, stage unspecified: Secondary | ICD-10-CM | POA: Diagnosis not present

## 2014-01-26 DIAGNOSIS — E119 Type 2 diabetes mellitus without complications: Secondary | ICD-10-CM | POA: Diagnosis not present

## 2014-01-26 DIAGNOSIS — Z961 Presence of intraocular lens: Secondary | ICD-10-CM | POA: Diagnosis not present

## 2014-01-30 DIAGNOSIS — E782 Mixed hyperlipidemia: Secondary | ICD-10-CM | POA: Diagnosis not present

## 2014-01-30 DIAGNOSIS — E119 Type 2 diabetes mellitus without complications: Secondary | ICD-10-CM | POA: Diagnosis not present

## 2014-01-30 DIAGNOSIS — I10 Essential (primary) hypertension: Secondary | ICD-10-CM | POA: Diagnosis not present

## 2014-02-01 DIAGNOSIS — I10 Essential (primary) hypertension: Secondary | ICD-10-CM | POA: Diagnosis not present

## 2014-02-01 DIAGNOSIS — E782 Mixed hyperlipidemia: Secondary | ICD-10-CM | POA: Diagnosis not present

## 2014-02-01 DIAGNOSIS — J449 Chronic obstructive pulmonary disease, unspecified: Secondary | ICD-10-CM | POA: Diagnosis not present

## 2014-02-01 DIAGNOSIS — E119 Type 2 diabetes mellitus without complications: Secondary | ICD-10-CM | POA: Diagnosis not present

## 2014-04-24 ENCOUNTER — Encounter: Payer: Self-pay | Admitting: Cardiology

## 2014-04-24 ENCOUNTER — Ambulatory Visit (INDEPENDENT_AMBULATORY_CARE_PROVIDER_SITE_OTHER): Payer: Medicare Other | Admitting: Cardiology

## 2014-04-24 VITALS — BP 138/69 | HR 80 | Ht 64.0 in | Wt 209.1 lb

## 2014-04-24 DIAGNOSIS — E785 Hyperlipidemia, unspecified: Secondary | ICD-10-CM | POA: Diagnosis not present

## 2014-04-24 DIAGNOSIS — I2510 Atherosclerotic heart disease of native coronary artery without angina pectoris: Secondary | ICD-10-CM | POA: Diagnosis not present

## 2014-04-24 DIAGNOSIS — I10 Essential (primary) hypertension: Secondary | ICD-10-CM | POA: Diagnosis not present

## 2014-04-24 DIAGNOSIS — J449 Chronic obstructive pulmonary disease, unspecified: Secondary | ICD-10-CM

## 2014-04-24 DIAGNOSIS — J4489 Other specified chronic obstructive pulmonary disease: Secondary | ICD-10-CM

## 2014-04-24 MED ORDER — NITROGLYCERIN 0.4 MG SL SUBL: 0.4000 mg | SUBLINGUAL_TABLET | SUBLINGUAL | Status: AC | PRN

## 2014-04-24 NOTE — Assessment & Plan Note (Signed)
Keep followup with Dr. Margo AyeHall, no change made to current regimen.

## 2014-04-24 NOTE — Assessment & Plan Note (Signed)
Continues on Lipitor, lipids followed by Dr. Margo AyeHall.

## 2014-04-24 NOTE — Assessment & Plan Note (Signed)
Symptomatically stable with remote interventions to the RCA as outlined above. Continue current regimen, give prescription for as needed sublingual nitroglycerin. Continue annual followup pattern unless symptoms escalate.

## 2014-04-24 NOTE — Progress Notes (Signed)
Clinical Summary [name] is an 80 y.o.female presenting for an office visit. She is a former patient of Dr. Daleen Page, last seen in June 2014, this is our first meeting. She has had no cardiac hospitalizations in the interim, tells me that she does not experience any regular angina symptoms. She uses oxygen chronically with COPD and shortness of breath at baseline.  ECG today shows sinus rhythm with right bundle branch block and old inferior infarct pattern. Nonspecific T-wave changes also noted.  She reports compliance with her medications, and three-month visits with Dr. Margo Page. He has been following her lab work. No changes made in statin regimen.   Allergies  Allergen Reactions  . Codeine     Current Outpatient Prescriptions  Medication Sig Dispense Refill  . aspirin 81 MG tablet Take 81 mg by mouth daily.        Marland Kitchen. atorvastatin (LIPITOR) 10 MG tablet Take 10 mg by mouth daily. Take 1/2 tab daily      . escitalopram (LEXAPRO) 20 MG tablet Take 20 mg by mouth daily.       . fish oil-omega-3 fatty acids 1000 MG capsule Take 2 g by mouth daily.        . furosemide (LASIX) 40 MG tablet Take 40 mg by mouth daily.        Marland Kitchen. glipiZIDE (GLUCOTROL) 5 MG tablet Take 5 mg by mouth daily.       Marland Kitchen. ipratropium-albuterol (DUONEB) 0.5-2.5 (3) MG/3ML SOLN Take 3 mLs by nebulization as needed.        . isosorbide mononitrate (IMDUR) 30 MG 24 hr tablet Take 30 mg by mouth daily.        Marland Kitchen. LORazepam (ATIVAN) 1 MG tablet Take 0.5 mg by mouth daily.       Marland Kitchen. losartan (COZAAR) 50 MG tablet Take 25 mg by mouth daily.       . niacin (NIASPAN) 500 MG CR tablet Take 500 mg by mouth at bedtime.        . NON FORMULARY Pt on 2.5 liters of O2 daily      . omeprazole (PRILOSEC) 20 MG capsule Take 20 mg by mouth daily.        . valsartan (DIOVAN) 160 MG tablet Take 160 mg by mouth daily.        . nitroGLYCERIN (NITROSTAT) 0.4 MG SL tablet Place 1 tablet (0.4 mg total) under the tongue every 5 (five) minutes as needed  for chest pain.  25 tablet  3   No current facility-administered medications for this visit.    Past Medical History  Diagnosis Date  . Asthma   . Coronary atherosclerosis of native coronary artery     BMS x 3 RCA 1999 [facility] PTCRA RCA 01/2000, LVEF 51% by MV 2004  . Essential hypertension, benign   . Hyperlipidemia   . Type 2 diabetes mellitus   . History of GI bleed   . COPD (chronic obstructive pulmonary disease)     Oxygen dependent  . Old inferior wall myocardial infarction     [facility]    Social History [name] reports that she quit smoking about 16 years ago. Her smoking use included Cigarettes. She has a 80 pack-year smoking history. She has never used smokeless tobacco. [name] reports that she does not drink alcohol.  Review of Systems No palpitations or syncope. No reported bleeding episodes. No falls. Stable appetite. Other systems reviewed and negative.  Physical Examination Filed Vitals:  04/24/14 1118  BP: 138/69  Pulse: 80   Filed Weights   04/24/14 1118  Weight: 209 lb 1.9 oz (94.856 kg)   No distress, wearing oxygen via nasal cannula. HEENT: Conjunctiva and lids normal, oropharynx clear. Neck: Supple, no elevated JVP or carotid bruits, no thyromegaly. Lungs: Have decreased breath sounds without wheezes, nonlabored breathing at rest. Cardiac: Regular rate and rhythm, no S3 or significant systolic murmur, no pericardial rub. Abdomen: Soft, nontender, bowel sounds present. Extremities: No pitting edema, distal pulses 2+. Skin: Warm and dry. Musculoskeletal: No kyphosis. Neuropsychiatric: Alert and oriented x3, affect grossly appropriate.   Problem List and Plan   Coronary atherosclerosis of native coronary artery Symptomatically stable with remote interventions to the RCA as outlined above. Continue current regimen, give prescription for as needed sublingual nitroglycerin. Continue annual followup pattern unless symptoms  escalate.  HYPERLIPIDEMIA Continues on Lipitor, lipids followed by Dr. Margo Page.  COPD (chronic obstructive pulmonary disease) On chronic oxygen.  Essential hypertension, benign Keep followup with Dr. Margo Page, no change made to current regimen.    [name], M.D., F.A.C.C.

## 2014-04-24 NOTE — Assessment & Plan Note (Signed)
On chronic oxygen

## 2014-04-24 NOTE — Patient Instructions (Signed)
Your physician wants you to follow-up in: 1 year with Dr.McDowell You will receive a reminder letter in the mail two months in advance. If you don't receive a letter, please call our office to schedule the follow-up appointment.      Your physician has recommended you make the following change in your medication:     Please use nitroglycerin as needed for chest pain.I have provided you with instructions for use     Thank you for choosing [facility] ! Nitroglycerin sublingual tablets What is this medicine? NITROGLYCERIN (nye troe GLI ser in) is a type of vasodilator. It relaxes blood vessels, increasing the blood and oxygen supply to your heart. This medicine is used to relieve chest pain caused by angina. It is also used to prevent chest pain before activities like climbing stairs, going outdoors in cold weather, or sexual activity. This medicine may be used for other purposes; ask your health care provider or pharmacist if you have questions. COMMON BRAND NAME(S): Nitroquick, Nitrostat, Nitrotab What should I tell my health care provider before I take this medicine? They need to know if you have any of these conditions: -anemia -head injury, recent stroke, or bleeding in the brain -liver disease -previous heart attack -an unusual or allergic reaction to nitroglycerin, other medicines, foods, dyes, or preservatives -pregnant or trying to get pregnant -breast-feeding How should I use this medicine? Take this medicine by mouth as needed. At the first sign of an angina attack (chest pain or tightness) place one tablet under your tongue. You can also take this medicine 5 to 10 minutes before an event likely to produce chest pain. Follow the directions on the prescription label. Let the tablet dissolve under the tongue. Do not swallow whole. Replace the dose if you accidentally swallow it. It will help if your mouth is not dry. Saliva around the tablet will help it  to dissolve more quickly. Do not eat or drink, smoke or chew tobacco while a tablet is dissolving. If you are not better within 5 minutes after taking ONE dose of nitroglycerin, call 9-1-1 immediately to seek emergency medical care. Do not take more than 3 nitroglycerin tablets over 15 minutes. If you take this medicine often to relieve symptoms of angina, your doctor or health care professional may provide you with different instructions to manage your symptoms. If symptoms do not go away after following these instructions, it is important to call 9-1-1 immediately. Do not take more than 3 nitroglycerin tablets over 15 minutes. Talk to your pediatrician regarding the use of this medicine in children. Special care may be needed. Overdosage: If you think you have taken too much of this medicine contact a poison control center or emergency room at once. NOTE: This medicine is only for you. Do not share this medicine with others. What if I miss a dose? This does not apply. This medicine is only used as needed. What may interact with this medicine? Do not take this medicine with any of the following medications: -certain migraine medicines like ergotamine and dihydroergotamine (DHE) -medicines used to treat erectile dysfunction like sildenafil, tadalafil, and vardenafil -riociguat This medicine may also interact with the following medications: -alteplase -aspirin -heparin -medicines for high blood pressure -medicines for mental depression -other medicines used to treat angina -phenothiazines like chlorpromazine, mesoridazine, prochlorperazine, thioridazine This list may not describe all possible interactions. Give your health care provider a list of all the medicines, herbs, non-prescription drugs, or dietary supplements you  use. Also tell them if you smoke, drink alcohol, or use illegal drugs. Some items may interact with your medicine. What should I watch for while using this medicine? Tell your  doctor or health care professional if you feel your medicine is no longer working. Keep this medicine with you at all times. Sit or lie down when you take your medicine to prevent falling if you feel dizzy or faint after using it. Try to remain calm. This will help you to feel better faster. If you feel dizzy, take several deep breaths and lie down with your feet propped up, or bend forward with your head resting between your knees. You may get drowsy or dizzy. Do not drive, use machinery, or do anything that needs mental alertness until you know how this drug affects you. Do not stand or sit up quickly, especially if you are an older patient. This reduces the risk of dizzy or fainting spells. Alcohol can make you more drowsy and dizzy. Avoid alcoholic drinks. Do not treat yourself for coughs, colds, or pain while you are taking this medicine without asking your doctor or health care professional for advice. Some ingredients may increase your blood pressure. What side effects may I notice from receiving this medicine? Side effects that you should report to your doctor or health care professional as soon as possible: -blurred vision -dry mouth -skin rash -sweating -the feeling of extreme pressure in the head -unusually weak or tired Side effects that usually do not require medical attention (report to your doctor or health care professional if they continue or are bothersome): -flushing of the face or neck -headache -irregular heartbeat, palpitations -nausea, vomiting This list may not describe all possible side effects. Call your doctor for medical advice about side effects. You may report side effects to FDA at 1-800-FDA-1088. Where should I keep my medicine? Keep out of the reach of children. Store at room temperature between 20 and 25 degrees C (68 and 77 degrees F). Store in Retail buyeroriginal container. Protect from light and moisture. Keep tightly closed. Throw away any unused medicine after the  expiration date. NOTE: This sheet is a summary. It may not cover all possible information. If you have questions about this medicine, talk to your doctor, pharmacist, or health care provider.  2015, Elsevier/Gold Standard. (2013-07-28 10:27:26)

## 2014-04-27 DIAGNOSIS — H353190 Nonexudative age-related macular degeneration, unspecified eye, stage unspecified: Secondary | ICD-10-CM | POA: Diagnosis not present

## 2014-04-27 DIAGNOSIS — H353290 Exudative age-related macular degeneration, unspecified eye, stage unspecified: Secondary | ICD-10-CM | POA: Diagnosis not present

## 2014-06-12 DIAGNOSIS — M25569 Pain in unspecified knee: Secondary | ICD-10-CM | POA: Diagnosis not present

## 2014-06-21 DIAGNOSIS — I10 Essential (primary) hypertension: Secondary | ICD-10-CM | POA: Diagnosis not present

## 2014-06-21 DIAGNOSIS — E119 Type 2 diabetes mellitus without complications: Secondary | ICD-10-CM | POA: Diagnosis not present

## 2014-06-21 DIAGNOSIS — E782 Mixed hyperlipidemia: Secondary | ICD-10-CM | POA: Diagnosis not present

## 2014-06-23 DIAGNOSIS — I2510 Atherosclerotic heart disease of native coronary artery without angina pectoris: Secondary | ICD-10-CM | POA: Diagnosis not present

## 2014-06-23 DIAGNOSIS — Z23 Encounter for immunization: Secondary | ICD-10-CM | POA: Diagnosis not present

## 2014-06-23 DIAGNOSIS — E119 Type 2 diabetes mellitus without complications: Secondary | ICD-10-CM | POA: Diagnosis not present

## 2014-06-23 DIAGNOSIS — I10 Essential (primary) hypertension: Secondary | ICD-10-CM | POA: Diagnosis not present

## 2014-06-23 DIAGNOSIS — E782 Mixed hyperlipidemia: Secondary | ICD-10-CM | POA: Diagnosis not present

## 2014-06-27 DIAGNOSIS — M25529 Pain in unspecified elbow: Secondary | ICD-10-CM | POA: Diagnosis not present

## 2014-06-27 DIAGNOSIS — M25469 Effusion, unspecified knee: Secondary | ICD-10-CM | POA: Diagnosis not present

## 2014-07-27 DIAGNOSIS — H3531 Nonexudative age-related macular degeneration: Secondary | ICD-10-CM | POA: Diagnosis not present

## 2014-07-27 DIAGNOSIS — H3532 Exudative age-related macular degeneration: Secondary | ICD-10-CM | POA: Diagnosis not present

## 2014-09-27 DIAGNOSIS — E119 Type 2 diabetes mellitus without complications: Secondary | ICD-10-CM | POA: Diagnosis not present

## 2014-09-27 DIAGNOSIS — I10 Essential (primary) hypertension: Secondary | ICD-10-CM | POA: Diagnosis not present

## 2014-09-27 DIAGNOSIS — E785 Hyperlipidemia, unspecified: Secondary | ICD-10-CM | POA: Diagnosis not present

## 2014-09-29 DIAGNOSIS — M25529 Pain in unspecified elbow: Secondary | ICD-10-CM | POA: Diagnosis not present

## 2014-09-29 DIAGNOSIS — M25469 Effusion, unspecified knee: Secondary | ICD-10-CM | POA: Diagnosis not present

## 2014-09-29 DIAGNOSIS — E785 Hyperlipidemia, unspecified: Secondary | ICD-10-CM | POA: Diagnosis not present

## 2014-09-29 DIAGNOSIS — I10 Essential (primary) hypertension: Secondary | ICD-10-CM | POA: Diagnosis not present

## 2014-09-29 DIAGNOSIS — E119 Type 2 diabetes mellitus without complications: Secondary | ICD-10-CM | POA: Diagnosis not present

## 2014-10-03 ENCOUNTER — Other Ambulatory Visit (HOSPITAL_COMMUNITY): Payer: Self-pay | Admitting: Internal Medicine

## 2014-10-03 DIAGNOSIS — R0989 Other specified symptoms and signs involving the circulatory and respiratory systems: Secondary | ICD-10-CM

## 2014-10-06 DIAGNOSIS — I10 Essential (primary) hypertension: Secondary | ICD-10-CM | POA: Diagnosis not present

## 2014-10-06 DIAGNOSIS — M25469 Effusion, unspecified knee: Secondary | ICD-10-CM | POA: Diagnosis not present

## 2014-10-06 DIAGNOSIS — M25529 Pain in unspecified elbow: Secondary | ICD-10-CM | POA: Diagnosis not present

## 2014-10-06 DIAGNOSIS — E119 Type 2 diabetes mellitus without complications: Secondary | ICD-10-CM | POA: Diagnosis not present

## 2014-10-23 ENCOUNTER — Ambulatory Visit (HOSPITAL_COMMUNITY)
Admission: RE | Admit: 2014-10-23 | Discharge: 2014-10-23 | Disposition: A | Discharge status: Home / Self Care | Payer: Medicare Other | Source: Ambulatory Visit | Attending: Internal Medicine | Admitting: Internal Medicine

## 2014-10-23 DIAGNOSIS — I6522 Occlusion and stenosis of left carotid artery: Secondary | ICD-10-CM | POA: Diagnosis not present

## 2014-10-23 DIAGNOSIS — Z720 Tobacco use: Secondary | ICD-10-CM | POA: Diagnosis not present

## 2014-10-23 DIAGNOSIS — I2510 Atherosclerotic heart disease of native coronary artery without angina pectoris: Secondary | ICD-10-CM | POA: Insufficient documentation

## 2014-10-23 DIAGNOSIS — I10 Essential (primary) hypertension: Secondary | ICD-10-CM | POA: Insufficient documentation

## 2014-10-23 DIAGNOSIS — Z87891 Personal history of nicotine dependence: Secondary | ICD-10-CM | POA: Insufficient documentation

## 2014-10-23 DIAGNOSIS — E119 Type 2 diabetes mellitus without complications: Secondary | ICD-10-CM | POA: Insufficient documentation

## 2014-10-23 DIAGNOSIS — R0989 Other specified symptoms and signs involving the circulatory and respiratory systems: Secondary | ICD-10-CM | POA: Insufficient documentation

## 2014-10-30 ENCOUNTER — Other Ambulatory Visit: Payer: Self-pay | Admitting: *Deleted

## 2014-10-30 DIAGNOSIS — I6523 Occlusion and stenosis of bilateral carotid arteries: Secondary | ICD-10-CM

## 2014-10-30 DIAGNOSIS — J449 Chronic obstructive pulmonary disease, unspecified: Secondary | ICD-10-CM | POA: Diagnosis not present

## 2014-11-01 ENCOUNTER — Other Ambulatory Visit: Payer: Self-pay | Admitting: *Deleted

## 2014-11-01 DIAGNOSIS — I6523 Occlusion and stenosis of bilateral carotid arteries: Secondary | ICD-10-CM

## 2014-11-27 ENCOUNTER — Encounter: Payer: Self-pay | Admitting: Vascular Surgery

## 2014-11-28 ENCOUNTER — Ambulatory Visit (HOSPITAL_COMMUNITY)
Admission: RE | Admit: 2014-11-28 | Discharge: 2014-11-28 | Disposition: A | Discharge status: Home / Self Care | Payer: Medicare Other | Source: Ambulatory Visit | Attending: Vascular Surgery | Admitting: Vascular Surgery

## 2014-11-28 ENCOUNTER — Encounter: Payer: Self-pay | Admitting: Vascular Surgery

## 2014-11-28 ENCOUNTER — Ambulatory Visit (INDEPENDENT_AMBULATORY_CARE_PROVIDER_SITE_OTHER): Payer: Medicare Other | Admitting: Vascular Surgery

## 2014-11-28 VITALS — BP 141/57 | HR 66 | Ht 64.0 in | Wt 205.6 lb

## 2014-11-28 DIAGNOSIS — I6522 Occlusion and stenosis of left carotid artery: Secondary | ICD-10-CM | POA: Diagnosis present

## 2014-11-28 DIAGNOSIS — I6523 Occlusion and stenosis of bilateral carotid arteries: Secondary | ICD-10-CM

## 2014-11-28 NOTE — Progress Notes (Signed)
Patient name: [name] MRN: [id number] DOB: 1931/09/14 Sex: female   Referred by: Margo Aye  Reason for referral:  Chief Complaint  Patient presents with  . New Evaluation    caroitd stenosis    HISTORY OF PRESENT ILLNESS: Patient is very pleasant 81 year old female presents today for discussion regarding her recent outpatient carotid duplex. She denies any prior history of amaurosis fugax, transient ischemic attack or stroke. Outpatient duplex suggested a possible moderate to severe left carotid stenosis. She does have a history of coronary artery disease with myocardial infarction in 1999. She was a cigarette smoker up until that time and quit smoking in 1999. She does have severe COPD and has a continuous oxygen therapy. She walks with a rolling walker. No history of peripheral vascular occlusive disease.  Past Medical History  Diagnosis Date  . Asthma   . Coronary atherosclerosis of native coronary artery     BMS x 3 RCA 1999 [facility]), PTCRA RCA 01/2000, LVEF 51% by MV 2004  . Essential hypertension, benign   . Hyperlipidemia   . Type 2 diabetes mellitus   . History of GI bleed   . COPD (chronic obstructive pulmonary disease)     Oxygen dependent  . Old inferior wall myocardial infarction     [facility] 1999    Past Surgical History  Procedure Laterality Date  . Cesareen section    . Cholecystectomy    . Exploratory laparotomy    . Partial colectomy      History   Social History  . Marital Status: Widowed    Spouse Name: N/A    Number of Children: N/A  . Years of Education: N/A   Occupational History  . Not on file.   Social History Main Topics  . Smoking status: Former Smoker -- 2.00 packs/day for 40 years    Types: Cigarettes    Quit date: 01/13/1998  . Smokeless tobacco: Never Used  . Alcohol Use: No  . Drug Use: No  . Sexual Activity: Not on file   Other Topics Concern  . Not on file   Social History Narrative    Family History  Problem  Relation Age of Onset  . Diabetes Mother   . Diabetes Father   . Heart disease Father   . Cancer Sister   . Cancer Brother   . Diabetes Brother     Allergies as of 11/28/2014 - Review Complete 11/28/2014  Allergen Reaction Noted  . Codeine  01/14/2011    Current Outpatient Prescriptions on File Prior to Visit  Medication Sig Dispense Refill  . aspirin 81 MG tablet Take 81 mg by mouth daily.      Marland Kitchen atorvastatin (LIPITOR) 10 MG tablet Take 10 mg by mouth daily. Take 1/2 tab daily    . escitalopram (LEXAPRO) 20 MG tablet Take 20 mg by mouth daily.     . furosemide (LASIX) 40 MG tablet Take 40 mg by mouth daily.      Marland Kitchen glipiZIDE (GLUCOTROL) 5 MG tablet Take 5 mg by mouth daily.     . isosorbide mononitrate (IMDUR) 30 MG 24 hr tablet Take 30 mg by mouth daily.      Marland Kitchen losartan (COZAAR) 50 MG tablet Take 25 mg by mouth daily.     . NON FORMULARY Pt on 2.5 liters of O2 daily    . omeprazole (PRILOSEC) 20 MG capsule Take 20 mg by mouth daily.      . fish oil-omega-3 fatty  acids 1000 MG capsule Take 2 g by mouth daily.      Marland Kitchen. ipratropium-albuterol (DUONEB) 0.5-2.5 (3) MG/3ML SOLN Take 3 mLs by nebulization as needed.      Marland Kitchen. LORazepam (ATIVAN) 1 MG tablet Take 0.5 mg by mouth daily.     . niacin (NIASPAN) 500 MG CR tablet Take 500 mg by mouth at bedtime.      . nitroGLYCERIN (NITROSTAT) 0.4 MG SL tablet Place 1 tablet (0.4 mg total) under the tongue every 5 (five) minutes as needed for chest pain. (Patient not taking: Reported on 11/28/2014) 25 tablet 3  . valsartan (DIOVAN) 160 MG tablet Take 160 mg by mouth daily.       No current facility-administered medications on file prior to visit.     REVIEW OF SYSTEMS:  Positives indicated with an "X"  CARDIOVASCULAR:  [ ]  chest pain   [ ]  chest pressure   [ ]  palpitations   [ ]  orthopnea   [ ]  dyspnea on exertion   [ ]  claudication   [ ]  rest pain   [ ]  DVT   [ ]  phlebitis PULMONARY:   [ ]  productive cough   [ ]  asthma   [ ]   wheezing NEUROLOGIC:   [ ]  weakness  [ ]  paresthesias  [ ]  aphasia  [ ]  amaurosis  [ ]  dizziness HEMATOLOGIC:   [ ]  bleeding problems   [ ]  clotting disorders MUSCULOSKELETAL:  [ ]  joint pain   [ ]  joint swelling GASTROINTESTINAL: [ ]   blood in stool  [ ]   hematemesis GENITOURINARY:  [ ]   dysuria  [ ]   hematuria PSYCHIATRIC:  [ ]  history of major depression INTEGUMENTARY:  [ ]  rashes  [ ]  ulcers CONSTITUTIONAL:  [ ]  fever   [ ]  chills  PHYSICAL EXAMINATION:  General: The patient is a well-nourished female, in no acute distress. Some increased work of respiration on oxygen. Vital signs are BP 141/57 mmHg  Pulse 66  Ht 5\' 4"  (1.626 m)  Wt 205 lb 9.6 oz (93.26 kg)  BMI 35.27 kg/m2  SpO2 97% Pulmonary: There is a good air exchange bilaterally without wheezing or rales. Abdomen: Soft and non-tender with normal pitch bowel sounds. Musculoskeletal: There are no major deformities.  There is no significant extremity pain. Mild lower extremity edema bilaterally Neurologic: No focal weakness or paresthesias are detected, Skin: There are no ulcer or rashes noted. Psychiatric: The patient has normal affect. Cardiovascular: There is a regular rate and rhythm without significant murmur appreciated. Carotid arteries without bruits bilaterally. 2+ radial pulses bilaterally  [facility] Vascular Lab Studies:  Ordered and Independently Reviewed repeat carotid left duplex revealed mild to moderate stenosis. Her peak systolic velocities were 295 cm/s.  I did compare this to her outpatient study at [facility] she had a somewhat higher predicted velocities on the left internal carotid artery area and peak systolic velocity there was 256 cm/s although her diastolic flow was 13 cm/s.  Impression and Plan:  Had long discussion with the patient. She was quite relieved that she does not require any carotid intervention at this time. I would recommend repeat duplex in one year to rule out any progression of  her moderate stenosis. Did review symptoms of stroke with patient and she knows to report immediately to the emergency room should this occur. Otherwise we'll see her in one year with repeat carotid duplex    Ashby [facility] [place] Office: [phone]

## 2014-11-28 NOTE — Addendum Note (Signed)
Addended by: Sharee PimpleMCCHESNEY, Almee Pelphrey K on: 11/28/2014 04:45 PM   Modules accepted: Orders

## 2014-12-18 DIAGNOSIS — H3532 Exudative age-related macular degeneration: Secondary | ICD-10-CM | POA: Diagnosis not present

## 2014-12-18 DIAGNOSIS — H3531 Nonexudative age-related macular degeneration: Secondary | ICD-10-CM | POA: Diagnosis not present

## 2015-02-02 DIAGNOSIS — I10 Essential (primary) hypertension: Secondary | ICD-10-CM | POA: Diagnosis not present

## 2015-02-02 DIAGNOSIS — E785 Hyperlipidemia, unspecified: Secondary | ICD-10-CM | POA: Diagnosis not present

## 2015-02-02 DIAGNOSIS — E119 Type 2 diabetes mellitus without complications: Secondary | ICD-10-CM | POA: Diagnosis not present

## 2015-02-06 DIAGNOSIS — E785 Hyperlipidemia, unspecified: Secondary | ICD-10-CM | POA: Diagnosis not present

## 2015-02-06 DIAGNOSIS — M25562 Pain in left knee: Secondary | ICD-10-CM | POA: Diagnosis not present

## 2015-02-06 DIAGNOSIS — I10 Essential (primary) hypertension: Secondary | ICD-10-CM | POA: Diagnosis not present

## 2015-02-06 DIAGNOSIS — E119 Type 2 diabetes mellitus without complications: Secondary | ICD-10-CM | POA: Diagnosis not present

## 2015-02-06 DIAGNOSIS — M25561 Pain in right knee: Secondary | ICD-10-CM | POA: Diagnosis not present

## 2015-02-06 DIAGNOSIS — I6523 Occlusion and stenosis of bilateral carotid arteries: Secondary | ICD-10-CM | POA: Diagnosis not present

## 2015-04-16 DIAGNOSIS — H3531 Nonexudative age-related macular degeneration: Secondary | ICD-10-CM | POA: Diagnosis not present

## 2015-04-16 DIAGNOSIS — H3532 Exudative age-related macular degeneration: Secondary | ICD-10-CM | POA: Diagnosis not present

## 2015-04-27 ENCOUNTER — Encounter: Payer: Self-pay | Admitting: Cardiology

## 2015-04-27 ENCOUNTER — Ambulatory Visit (INDEPENDENT_AMBULATORY_CARE_PROVIDER_SITE_OTHER): Payer: Medicare Other | Admitting: Cardiology

## 2015-04-27 VITALS — BP 128/64 | HR 78 | Ht 64.0 in | Wt 206.8 lb

## 2015-04-27 DIAGNOSIS — E785 Hyperlipidemia, unspecified: Secondary | ICD-10-CM | POA: Diagnosis not present

## 2015-04-27 DIAGNOSIS — I2510 Atherosclerotic heart disease of native coronary artery without angina pectoris: Secondary | ICD-10-CM

## 2015-04-27 DIAGNOSIS — I6523 Occlusion and stenosis of bilateral carotid arteries: Secondary | ICD-10-CM

## 2015-04-27 DIAGNOSIS — I10 Essential (primary) hypertension: Secondary | ICD-10-CM | POA: Diagnosis not present

## 2015-04-27 NOTE — Progress Notes (Signed)
Cardiology Office Note  Date: 04/27/2015   ID: [name], DOB [birth year], MRN [id number]  PCP: Catalina Pizza, MD  Primary Cardiologist: Nona Dell, MD   Chief Complaint  Patient presents with  . Coronary Artery Disease  . Hypertension  . Hyperlipidemia    History of Present Illness: [name] is an 81 y.o. female was last seen in July 2015. She presents for a routine follow-up visit.  From a cardiac perspective, no major change in overall status since I last saw her. He denies any angina symptoms. We continue to follow her conservatively on medical therapy. Follow-up ECG is reviewed below.  She uses oxygen continuously, functions with basic ADLs but is otherwise not very active. She enjoys reading, a book mobile from the [employer] visits her once a month, and she reads about 20 books each month.  Interval follow-up noted with Dr. Arbie Cookey in February. Carotid Dopplers revealed less than 40% LICA stenosis. She does not report any focal motor weakness, speech or memory deficits.  We reviewed her medications. She continues to follow with Dr. Margo Aye for lipid management. No side effects with Lipitor.   Past Medical History  Diagnosis Date  . Asthma   . Coronary atherosclerosis of native coronary artery     BMS x 3 RCA 1999 [facility]), PTCRA RCA 01/2000, LVEF 51% by MV 2004  . Essential hypertension, benign   . Hyperlipidemia   . Type 2 diabetes mellitus   . History of GI bleed   . COPD (chronic obstructive pulmonary disease)     Oxygen dependent  . Old inferior wall myocardial infarction     [facility] 1999    Past Surgical History  Procedure Laterality Date  . Cesareen section    . Cholecystectomy    . Exploratory laparotomy    . Partial colectomy      Current Outpatient Prescriptions  Medication Sig Dispense Refill  . aspirin 81 MG tablet Take 81 mg by mouth daily.      Marland Kitchen atorvastatin (LIPITOR) 10 MG tablet Take 10 mg by mouth daily. Take 1/2 tab daily    .  escitalopram (LEXAPRO) 20 MG tablet Take 20 mg by mouth daily.     . fish oil-omega-3 fatty acids 1000 MG capsule Take 2 g by mouth daily.      . furosemide (LASIX) 40 MG tablet Take 40 mg by mouth daily.      Marland Kitchen glipiZIDE (GLUCOTROL) 5 MG tablet Take 5 mg by mouth daily.     Marland Kitchen ipratropium-albuterol (DUONEB) 0.5-2.5 (3) MG/3ML SOLN Take 3 mLs by nebulization as needed.      . isosorbide mononitrate (IMDUR) 30 MG 24 hr tablet Take 30 mg by mouth daily.      Marland Kitchen LORazepam (ATIVAN) 1 MG tablet Take 0.5 mg by mouth daily.     Marland Kitchen losartan (COZAAR) 50 MG tablet Take 25 mg by mouth daily.     . niacin (NIASPAN) 500 MG CR tablet Take 500 mg by mouth at bedtime.      . nitroGLYCERIN (NITROSTAT) 0.4 MG SL tablet Place 1 tablet (0.4 mg total) under the tongue every 5 (five) minutes as needed for chest pain. 25 tablet 3  . NON FORMULARY Pt on 2.5 liters of O2 daily    . omeprazole (PRILOSEC) 20 MG capsule Take 20 mg by mouth daily.      . valsartan (DIOVAN) 160 MG tablet Take 160 mg by mouth daily.  No current facility-administered medications for this visit.    Allergies:  Codeine   Social History: The patient  reports that she quit smoking about 17 years ago. Her smoking use included Cigarettes. She has a 80 pack-year smoking history. She has never used smokeless tobacco. She reports that she does not drink alcohol or use illicit drugs.   ROS:  Please see the history of present illness. Otherwise, complete review of systems is positive for NYHA class 2-3 dyspnea. No palpitations or syncope..  All other systems are reviewed and negative.   Physical Exam: VS:  BP 128/64 mmHg  Pulse 78  Ht 5\' 4"  (1.626 m)  Wt 206 lb 12.8 oz (93.804 kg)  BMI 35.48 kg/m2  SpO2 95%, BMI Body mass index is 35.48 kg/(m^2).  Wt Readings from Last 3 Encounters:  04/27/15 206 lb 12.8 oz (93.804 kg)  11/28/14 205 lb 9.6 oz (93.26 kg)  04/24/14 209 lb 1.9 oz (94.856 kg)     No distress, wearing oxygen via nasal  cannula. HEENT: Conjunctiva and lids normal, oropharynx clear. Neck: Supple, no elevated JVP or carotid bruits, no thyromegaly. Lungs: Decreased breath sounds without wheezes, nonlabored breathing at rest. Cardiac: Regular rate and rhythm, no S3 or significant systolic murmur, no pericardial rub. Abdomen: Soft, nontender, bowel sounds present. Extremities: No pitting edema, distal pulses 2+. Skin: Warm and dry. Musculoskeletal: No kyphosis. Neuropsychiatric: Alert and oriented x3, affect grossly appropriate.   ECG: ECG is ordered today  An shows normal sinus rhythm with right bundle branch block and old inferior infarct pattern.   ASSESSMENT AND PLAN:  1. Symptomatically stable CAD status post remote percutaneous interventions as outlined above. We continue conservative follow-up on medical therapy. She has been stable for quite some time. Can always pursue further workup if symptoms intervene.  2. Hyperlipidemia, followed by Dr. Margo AyeHall, on Lipitor.  3. Essential hypertension, blood pressure control looks good today.  4. Type 2 diabetes mellitus, keep follow-up with Dr. Margo AyeHall.  5. COPD, on chronic oxygen.  Current medicines were reviewed at length with the patient today.   Orders Placed This Encounter  Procedures  . EKG 12-Lead    Disposition: FU with me in 1 year.   Signed, Jonelle SidleSamuel G. Bane Hagy, MD, [facility] 04/27/2015 1:43 PM    [facility] [address], [place], [place] [address] Phone: [phone]) [phone]; Fax: [phone]

## 2015-04-27 NOTE — Patient Instructions (Signed)
Your physician wants you to follow-up in: 1 Year with Dr. Diona BrownerMcDowell. You will receive a reminder letter in the mail two months in advance. If you don't receive a letter, please call our office to schedule the follow-up appointment.  Your physician recommends that you continue on your current medications as directed. Please refer to the Current Medication list given to you today.  Thank you for choosing [facility]!

## 2015-06-07 DIAGNOSIS — E119 Type 2 diabetes mellitus without complications: Secondary | ICD-10-CM | POA: Diagnosis not present

## 2015-06-07 DIAGNOSIS — E782 Mixed hyperlipidemia: Secondary | ICD-10-CM | POA: Diagnosis not present

## 2015-06-07 DIAGNOSIS — I10 Essential (primary) hypertension: Secondary | ICD-10-CM | POA: Diagnosis not present

## 2015-06-07 DIAGNOSIS — J449 Chronic obstructive pulmonary disease, unspecified: Secondary | ICD-10-CM | POA: Diagnosis not present

## 2015-06-11 DIAGNOSIS — E782 Mixed hyperlipidemia: Secondary | ICD-10-CM | POA: Diagnosis not present

## 2015-06-11 DIAGNOSIS — I10 Essential (primary) hypertension: Secondary | ICD-10-CM | POA: Diagnosis not present

## 2015-06-11 DIAGNOSIS — E119 Type 2 diabetes mellitus without complications: Secondary | ICD-10-CM | POA: Diagnosis not present

## 2015-06-11 DIAGNOSIS — J449 Chronic obstructive pulmonary disease, unspecified: Secondary | ICD-10-CM | POA: Diagnosis not present

## 2015-08-15 DIAGNOSIS — H353122 Nonexudative age-related macular degeneration, left eye, intermediate dry stage: Secondary | ICD-10-CM | POA: Diagnosis not present

## 2015-08-15 DIAGNOSIS — H353212 Exudative age-related macular degeneration, right eye, with inactive choroidal neovascularization: Secondary | ICD-10-CM | POA: Diagnosis not present

## 2015-08-23 DIAGNOSIS — J209 Acute bronchitis, unspecified: Secondary | ICD-10-CM | POA: Diagnosis not present

## 2015-08-23 DIAGNOSIS — R0602 Shortness of breath: Secondary | ICD-10-CM | POA: Diagnosis not present

## 2015-08-23 DIAGNOSIS — Z23 Encounter for immunization: Secondary | ICD-10-CM | POA: Diagnosis not present

## 2015-10-31 DIAGNOSIS — E119 Type 2 diabetes mellitus without complications: Secondary | ICD-10-CM | POA: Diagnosis not present

## 2015-10-31 DIAGNOSIS — I10 Essential (primary) hypertension: Secondary | ICD-10-CM | POA: Diagnosis not present

## 2015-10-31 DIAGNOSIS — E782 Mixed hyperlipidemia: Secondary | ICD-10-CM | POA: Diagnosis not present

## 2015-10-31 DIAGNOSIS — D509 Iron deficiency anemia, unspecified: Secondary | ICD-10-CM | POA: Diagnosis not present

## 2015-11-06 DIAGNOSIS — E119 Type 2 diabetes mellitus without complications: Secondary | ICD-10-CM | POA: Diagnosis not present

## 2015-11-06 DIAGNOSIS — I10 Essential (primary) hypertension: Secondary | ICD-10-CM | POA: Diagnosis not present

## 2015-11-06 DIAGNOSIS — E782 Mixed hyperlipidemia: Secondary | ICD-10-CM | POA: Diagnosis not present

## 2015-11-06 DIAGNOSIS — J449 Chronic obstructive pulmonary disease, unspecified: Secondary | ICD-10-CM | POA: Diagnosis not present

## 2015-11-27 ENCOUNTER — Encounter: Payer: Self-pay | Admitting: Family

## 2015-12-04 ENCOUNTER — Encounter: Payer: Self-pay | Admitting: Family

## 2015-12-04 ENCOUNTER — Ambulatory Visit (HOSPITAL_COMMUNITY)
Admission: RE | Admit: 2015-12-04 | Discharge: 2015-12-04 | Disposition: A | Discharge status: Home / Self Care | Payer: Medicare Other | Source: Ambulatory Visit | Attending: Vascular Surgery | Admitting: Vascular Surgery

## 2015-12-04 ENCOUNTER — Ambulatory Visit (INDEPENDENT_AMBULATORY_CARE_PROVIDER_SITE_OTHER): Payer: Medicare Other | Admitting: Family

## 2015-12-04 VITALS — BP 110/74 | HR 65 | Ht 64.0 in | Wt 163.8 lb

## 2015-12-04 DIAGNOSIS — E785 Hyperlipidemia, unspecified: Secondary | ICD-10-CM | POA: Insufficient documentation

## 2015-12-04 DIAGNOSIS — I6523 Occlusion and stenosis of bilateral carotid arteries: Secondary | ICD-10-CM

## 2015-12-04 DIAGNOSIS — I10 Essential (primary) hypertension: Secondary | ICD-10-CM | POA: Insufficient documentation

## 2015-12-04 DIAGNOSIS — I6522 Occlusion and stenosis of left carotid artery: Secondary | ICD-10-CM

## 2015-12-04 DIAGNOSIS — E119 Type 2 diabetes mellitus without complications: Secondary | ICD-10-CM | POA: Insufficient documentation

## 2015-12-04 NOTE — Patient Instructions (Signed)
Stroke Prevention Some medical conditions and behaviors are associated with an increased chance of having a stroke. You may prevent a stroke by making healthy choices and managing medical conditions. HOW CAN I REDUCE MY RISK OF HAVING A STROKE?   Stay physically active. Get at least 30 minutes of activity on most or all days.  Do not smoke. It may also be helpful to avoid exposure to secondhand smoke.  Limit alcohol use. Moderate alcohol use is considered to be:  No more than 2 drinks per day for men.  No more than 1 drink per day for nonpregnant women.  Eat healthy foods. This involves:  Eating 5 or more servings of fruits and vegetables a day.  Making dietary changes that address high blood pressure (hypertension), high cholesterol, diabetes, or obesity.  Manage your cholesterol levels.  Making food choices that are high in fiber and low in saturated fat, trans fat, and cholesterol may control cholesterol levels.  Take any prescribed medicines to control cholesterol as directed by your health care provider.  Manage your diabetes.  Controlling your carbohydrate and sugar intake is recommended to manage diabetes.  Take any prescribed medicines to control diabetes as directed by your health care provider.  Control your hypertension.  Making food choices that are low in salt (sodium), saturated fat, trans fat, and cholesterol is recommended to manage hypertension.  Ask your health care provider if you need treatment to lower your blood pressure. Take any prescribed medicines to control hypertension as directed by your health care provider.  If you are 18-39 years of age, have your blood pressure checked every 3-5 years. If you are 40 years of age or older, have your blood pressure checked every year.  Maintain a healthy weight.  Reducing calorie intake and making food choices that are low in sodium, saturated fat, trans fat, and cholesterol are recommended to manage  weight.  Stop drug abuse.  Avoid taking birth control pills.  Talk to your health care provider about the risks of taking birth control pills if you are over 35 years old, smoke, get migraines, or have ever had a blood clot.  Get evaluated for sleep disorders (sleep apnea).  Talk to your health care provider about getting a sleep evaluation if you snore a lot or have excessive sleepiness.  Take medicines only as directed by your health care provider.  For some people, aspirin or blood thinners (anticoagulants) are helpful in reducing the risk of forming abnormal blood clots that can lead to stroke. If you have the irregular heart rhythm of atrial fibrillation, you should be on a blood thinner unless there is a good reason you cannot take them.  Understand all your medicine instructions.  Make sure that other conditions (such as anemia or atherosclerosis) are addressed. SEEK IMMEDIATE MEDICAL CARE IF:   You have sudden weakness or numbness of the face, arm, or leg, especially on one side of the body.  Your face or eyelid droops to one side.  You have sudden confusion.  You have trouble speaking (aphasia) or understanding.  You have sudden trouble seeing in one or both eyes.  You have sudden trouble walking.  You have dizziness.  You have a loss of balance or coordination.  You have a sudden, severe headache with no known cause.  You have new chest pain or an irregular heartbeat. Any of these symptoms may represent a serious problem that is an emergency. Do not wait to see if the symptoms will   go away. Get medical help at once. Call your local emergency services (911 in [place] Do not drive yourself to the hospital.   This information is not intended to replace advice given to you by your health care provider. Make sure you discuss any questions you have with your health care provider.   Document Released: 11/13/2004 Document Revised: 10/27/2014 Document Reviewed:  04/08/2013 Elsevier Interactive Patient Education 2016 Elsevier Inc.  

## 2015-12-04 NOTE — Progress Notes (Signed)
Chief Complaint: Extracranial Carotid Artery Stenosis   History of Present Illness  [name] is a 82 y.o. female patient of Dr. Arbie Cookey whom he first evaluated in February 2016 regarding a recent carotid duplex at an outside facility. She denies any prior history of amaurosis fugax, transient ischemic attack or stroke. The carotid duplex suggested a possible moderate to severe left carotid stenosis. She does have a history of coronary artery disease with myocardial infarction in 1999. She was a cigarette smoker up until that time and quit smoking in 1999. She does have severe COPD and has continuous oxygen therapy. She walks with a rolling walker. No history of peripheral vascular occlusive disease.  She is performing water aerobics in her bathtub.  The patient reports New Medical or Surgical History: intentionally lost 30+ pounds since July 2016  Pt Diabetic: yes, pt states her last A1C was 5.? Pt smoker: former smoker, quit in 1999 when she had her MI  Pt meds include: Statin : yes ASA: yes Other anticoagulants/antiplatelets: no   Past Medical History  Diagnosis Date  . Asthma   . Coronary atherosclerosis of native coronary artery     BMS x 3 RCA 1999 [facility]), PTCRA RCA 01/2000, LVEF 51% by MV 2004  . Essential hypertension, benign   . Hyperlipidemia   . Type 2 diabetes mellitus (HCC)   . History of GI bleed   . COPD (chronic obstructive pulmonary disease) (HCC)     Oxygen dependent  . Old inferior wall myocardial infarction     [facility] 1999    Social History Social History  Substance Use Topics  . Smoking status: Former Smoker -- 2.00 packs/day for 40 years    Types: Cigarettes    Quit date: 01/13/1998  . Smokeless tobacco: Never Used  . Alcohol Use: No    Family History Family History  Problem Relation Age of Onset  . Diabetes Mother   . Diabetes Father   . Heart disease Father   . Cancer Sister   . Cancer Brother   . Diabetes Brother     Surgical  History Past Surgical History  Procedure Laterality Date  . Cesareen section    . Cholecystectomy    . Exploratory laparotomy    . Partial colectomy      Allergies  Allergen Reactions  . Codeine     Current Outpatient Prescriptions  Medication Sig Dispense Refill  . aspirin 81 MG tablet Take 81 mg by mouth daily.      Marland Kitchen atorvastatin (LIPITOR) 10 MG tablet Take 10 mg by mouth daily. Reported on 12/04/2015    . escitalopram (LEXAPRO) 20 MG tablet Take 20 mg by mouth daily.     . fish oil-omega-3 fatty acids 1000 MG capsule Take 2 g by mouth daily.      . furosemide (LASIX) 40 MG tablet Take 40 mg by mouth daily.      Marland Kitchen glipiZIDE (GLUCOTROL) 5 MG tablet Take 5 mg by mouth daily. Reported on 12/04/2015    . ipratropium-albuterol (DUONEB) 0.5-2.5 (3) MG/3ML SOLN Take 3 mLs by nebulization as needed.      . isosorbide mononitrate (IMDUR) 30 MG 24 hr tablet Take 30 mg by mouth daily.      Marland Kitchen LORazepam (ATIVAN) 1 MG tablet Take 0.5 mg by mouth daily.     Marland Kitchen losartan (COZAAR) 50 MG tablet Take 25 mg by mouth daily.     . niacin (NIASPAN) 500 MG CR tablet Take 500  mg by mouth at bedtime.      . nitroGLYCERIN (NITROSTAT) 0.4 MG SL tablet Place 1 tablet (0.4 mg total) under the tongue every 5 (five) minutes as needed for chest pain. 25 tablet 3  . NON FORMULARY Pt on 2.5 liters of O2 daily    . omeprazole (PRILOSEC) 20 MG capsule Take 20 mg by mouth daily.      . valsartan (DIOVAN) 160 MG tablet Take 160 mg by mouth daily.       No current facility-administered medications for this visit.    Review of Systems : See HPI for pertinent positives and negatives.  Physical Examination  Filed Vitals:   12/04/15 1005 12/04/15 1007  BP: 101/69 110/74  Pulse: 65   Height:  (1.626 m)   Weight: 163 lb 12.8 oz (74.299 kg)   SpO2: 100%    Body mass index is 28.1 kg/(m^2).  General: WDWN female in NAD GAIT: slow, deliberate Eyes: PERRLA Pulmonary:  Non-labored, CTAB, distant sounds in right  posterior fields. Using supplemental oxygen by nasal canula from a tank. Cardiac: regular rhythm, no detected murmur.  VASCULAR EXAM Carotid Bruits Right Left   Negative Negative    Aorta is not palpable. Radial pulses are 2+ palpable and equal.                                                                                                                            LE Pulses Right Left       POPLITEAL  not palpable   not palpable       POSTERIOR TIBIAL  not palpable   not palpable        DORSALIS PEDIS      ANTERIOR TIBIAL faintly palpable  faintly palpable     Gastrointestinal: soft, nontender, BS WNL, no r/g,  no palpable masses.  Musculoskeletal: no muscle atrophy/wasting. M/S 4/5 throughout, extremities without ischemic changes.  Neurologic: A&O X 3; Appropriate Affect, Speech is normal CN 2-12 intact except is hard of hearing, pain and light touch intact in extremities, Motor exam as listed above.   Non-Invasive Vascular Imaging CAROTID DUPLEX 12/04/2015   CEREBROVASCULAR DUPLEX EVALUATION    INDICATION: Carotid artery disease    PREVIOUS INTERVENTION(S): None    DUPLEX EXAM: Carotid duplex    RIGHT  LEFT  Peak Systolic Velocities (cm/s) End Diastolic Velocities (cm/s) Plaque LOCATION Peak Systolic Velocities (cm/s) End Diastolic Velocities (cm/s) Plaque  104 19  CCA PROXIMAL 81 21   93 21  CCA MID 68 16   70 13  CCA DISTAL 55 15 CP  71 11  ECA 170 13   52 11  ICA PROXIMAL 212 36 CP  89 30  ICA MID 64 17   68   ICA DISTAL 73 27     .55 ICA / CCA Ratio (PSV) 2.5  Antegrade Vertebral Flow Antegrade  -- Brachial Systolic Pressure (mmHg) -  Triphasic Brachial Artery Waveforms  Triphasic    Plaque Morphology:  HM = Homogeneous, HT = Heterogeneous, CP = Calcific Plaque, SP = Smooth Plaque, IP = Irregular Plaque     ADDITIONAL FINDINGS: Multiphasic subclavian arteries; Arrhythmias    IMPRESSION: Less than 40% right internal carotid artery stenosis. Less than  40% left internal carotid artery stenosis, calcific plaque may obscure higher velocity    Compared to the previous exam:  No change on the left since prior exam      Assessment: [name] is a 82 y.o. female who has no history of stroke or TIA. Today's carotid duplex suggests minimal bilateral ICA stenosis. No change in the left internal carotid artery since prior exam. Fortunately her DM is in excellent control and she quit smoking in 1999 when she had an MI.   Plan: Follow-up in 1 year with Carotid Duplex scan.   I discussed in depth with the patient the nature of atherosclerosis, and emphasized the importance of maximal medical management including strict control of blood pressure, blood glucose, and lipid levels, obtaining regular exercise, and continued cessation of smoking.  The patient is aware that without maximal medical management the underlying atherosclerotic disease process will progress, limiting the benefit of any interventions. The patient was given information about stroke prevention and what symptoms should prompt the patient to seek immediate medical care. Thank you for allowing [place] to participate in this patient's care.  Charisse March, RN, MSN, FNP-C [facility] Office: [phone]  Clinic Physician: Early  12/04/2015 10:07 AM

## 2015-12-05 NOTE — Addendum Note (Signed)
Addended by: Sharee Pimple on: 12/05/2015 11:32 AM   Modules accepted: Orders

## 2016-01-25 DIAGNOSIS — J440 Chronic obstructive pulmonary disease with acute lower respiratory infection: Secondary | ICD-10-CM | POA: Diagnosis not present

## 2016-01-25 DIAGNOSIS — R000 Tachycardia, unspecified: Secondary | ICD-10-CM | POA: Diagnosis not present

## 2016-02-11 ENCOUNTER — Ambulatory Visit (HOSPITAL_COMMUNITY)
Admission: RE | Admit: 2016-02-11 | Discharge: 2016-02-11 | Disposition: A | Discharge status: Home / Self Care | Payer: Medicare Other | Source: Ambulatory Visit | Attending: Internal Medicine | Admitting: Internal Medicine

## 2016-02-11 ENCOUNTER — Other Ambulatory Visit (HOSPITAL_COMMUNITY): Payer: Self-pay | Admitting: Internal Medicine

## 2016-02-11 DIAGNOSIS — R0602 Shortness of breath: Secondary | ICD-10-CM | POA: Diagnosis not present

## 2016-02-11 DIAGNOSIS — Z79899 Other long term (current) drug therapy: Secondary | ICD-10-CM | POA: Diagnosis not present

## 2016-02-11 DIAGNOSIS — R5383 Other fatigue: Secondary | ICD-10-CM

## 2016-02-11 DIAGNOSIS — D649 Anemia, unspecified: Secondary | ICD-10-CM | POA: Diagnosis not present

## 2016-02-11 DIAGNOSIS — I959 Hypotension, unspecified: Secondary | ICD-10-CM | POA: Diagnosis not present

## 2016-02-22 ENCOUNTER — Encounter: Payer: Self-pay | Admitting: *Deleted

## 2016-02-26 DIAGNOSIS — J9611 Chronic respiratory failure with hypoxia: Secondary | ICD-10-CM | POA: Diagnosis not present

## 2016-02-26 DIAGNOSIS — I480 Paroxysmal atrial fibrillation: Secondary | ICD-10-CM | POA: Diagnosis not present

## 2016-02-26 DIAGNOSIS — I499 Cardiac arrhythmia, unspecified: Secondary | ICD-10-CM | POA: Diagnosis not present

## 2016-02-27 ENCOUNTER — Encounter: Payer: Self-pay | Admitting: Cardiology

## 2016-02-27 ENCOUNTER — Encounter: Payer: Self-pay | Admitting: *Deleted

## 2016-02-27 ENCOUNTER — Ambulatory Visit (INDEPENDENT_AMBULATORY_CARE_PROVIDER_SITE_OTHER): Payer: Medicare Other | Admitting: Cardiology

## 2016-02-27 VITALS — BP 128/56 | HR 86 | Ht 64.0 in | Wt 167.0 lb

## 2016-02-27 DIAGNOSIS — J449 Chronic obstructive pulmonary disease, unspecified: Secondary | ICD-10-CM

## 2016-02-27 DIAGNOSIS — I481 Persistent atrial fibrillation: Secondary | ICD-10-CM

## 2016-02-27 DIAGNOSIS — I6522 Occlusion and stenosis of left carotid artery: Secondary | ICD-10-CM | POA: Diagnosis not present

## 2016-02-27 DIAGNOSIS — Z79899 Other long term (current) drug therapy: Secondary | ICD-10-CM

## 2016-02-27 DIAGNOSIS — I10 Essential (primary) hypertension: Secondary | ICD-10-CM | POA: Diagnosis not present

## 2016-02-27 DIAGNOSIS — I2510 Atherosclerotic heart disease of native coronary artery without angina pectoris: Secondary | ICD-10-CM | POA: Diagnosis not present

## 2016-02-27 DIAGNOSIS — I4819 Other persistent atrial fibrillation: Secondary | ICD-10-CM

## 2016-02-27 MED ORDER — DILTIAZEM HCL ER COATED BEADS 180 MG PO CP24: 180.0000 mg | ORAL_CAPSULE | Freq: Every day | ORAL | Status: DC

## 2016-02-27 NOTE — Patient Instructions (Signed)
Medication Instructions:  INCREASE CARDIZEM TO 180 MG DAILY   Labwork: Your physician recommends that you return for lab work in: ASAP CBC CMET   Testing/Procedures: NONE  Follow-Up: Your physician recommends that you schedule a follow-up appointment in: 2 WEEKS    Any Other Special Instructions Will Be Listed Below (If Applicable).     If you need a refill on your cardiac medications before your next appointment, please call your pharmacy.

## 2016-02-27 NOTE — Progress Notes (Signed)
Cardiology Office Note  Date: 02/27/2016   ID: [name], DOB [birth year], MRN [id number]  PCP: [name], MD  Primary Cardiologist: Nona Dell, MD   Chief Complaint  Patient presents with  . Irregular heartbeat  . Coronary Artery Disease    History of Present Illness: [name] is an 82 y.o. female last seen in July 2016. She is referred to the office today by Ms. Arturo Morton NP with Dr. Margo Aye for evaluation of irregular heartbeat and suspected atrial fibrillation. She is here today with a friend and also her daughter. She reports having an episode of pneumonia about a month ago, and has felt poorly since that time, specifically weak and short of breath, also states that her heart rate has been elevated. She has not had any angina symptoms.  I reviewed her ECG from 01/25/2016 which shows probable atrial fibrillation 114 bpm with right bundle branch block, old inferior infarct pattern, nonspecific ST changes.  CHADSVASC score is 5. Approximate annual risk of stroke on aspirin is 7.8% versus 2.6% if she were to change to a DOAC such as Eliquis. She does have a prior history of GI bleeding. Those episodes occurred when she was on heparin previously.  Today we talked about management strategies for atrial fibrillation. With oxygen-dependent COPD, it is unlikely that we are going to be able to easily maintain sinus rhythm. First approach will be to try and slow down her heart rate more to see if she feels better symptomatically. We addressed potential for anticoagulation with a DOAC such as Eliquis, but would need to watch this very carefully, and plan to obtain a CBC and BMET for baseline first. Her heart rate today was in the 90-100 range at rest and I recommended increasing Cardizem CD to 180 mg daily for now.  Past Medical History  Diagnosis Date  . Asthma   . Coronary atherosclerosis of native coronary artery     BMS x 3 RCA 1999 [facility]), PTCRA RCA 01/2000, LVEF 51% by MV  2004  . Essential hypertension, benign   . Hyperlipidemia   . Type 2 diabetes mellitus (HCC)   . History of GI bleed   . COPD (chronic obstructive pulmonary disease) (HCC)     Oxygen dependent  . Old inferior wall myocardial infarction     [facility] 1999    Past Surgical History  Procedure Laterality Date  . Cesareen section    . Cholecystectomy    . Exploratory laparotomy    . Partial colectomy      Current Outpatient Prescriptions  Medication Sig Dispense Refill  . aspirin 81 MG tablet Take 81 mg by mouth daily.      Marland Kitchen atorvastatin (LIPITOR) 10 MG tablet Take 10 mg by mouth daily. Reported on 12/04/2015    . fish oil-omega-3 fatty acids 1000 MG capsule Take 2 g by mouth daily.      . furosemide (LASIX) 40 MG tablet Take 40 mg by mouth daily.      Marland Kitchen ipratropium-albuterol (DUONEB) 0.5-2.5 (3) MG/3ML SOLN Take 3 mLs by nebulization as needed.      Marland Kitchen LORazepam (ATIVAN) 1 MG tablet Take 0.5 mg by mouth daily.     . niacin (NIASPAN) 500 MG CR tablet Take 500 mg by mouth at bedtime.      . nitroGLYCERIN (NITROSTAT) 0.4 MG SL tablet Place 1 tablet (0.4 mg total) under the tongue every 5 (five) minutes as needed for chest pain. 25 tablet 3  .  NON FORMULARY Pt on 2.5 liters of O2 daily    . omeprazole (PRILOSEC) 20 MG capsule Take 20 mg by mouth daily.      . valsartan (DIOVAN) 160 MG tablet Take 160 mg by mouth daily.      Marland Kitchen. diltiazem (CARDIZEM CD) 180 MG 24 hr capsule Take 1 capsule (180 mg total) by mouth daily. 90 capsule 3   No current facility-administered medications for this visit.   Allergies:  Codeine   Social History: The patient  reports that she quit smoking about 18 years ago. Her smoking use included Cigarettes. She has a 80 pack-year smoking history. She has never used smokeless tobacco. She reports that she does not drink alcohol or use illicit drugs.   ROS:  Please see the history of present illness. Otherwise, complete review of systems is positive for relative  inactivity, intentional weight loss, potential depression symptoms, chronic dyspnea exertion.  All other systems are reviewed and negative.   Physical Exam: VS:  BP 128/56 mmHg  Pulse 86  Ht 5\' 4"  (1.626 m)  Wt 167 lb (75.751 kg)  BMI 28.65 kg/m2  SpO2 90%, BMI Body mass index is 28.65 kg/(m^2).  Wt Readings from Last 3 Encounters:  02/27/16 167 lb (75.751 kg)  12/04/15 163 lb 12.8 oz (74.299 kg)  04/27/15 206 lb 12.8 oz (93.804 kg)    Chronically ill-appearing elderly woman in no active distress, wearing oxygen via nasal cannula. HEENT: Conjunctiva and lids normal, oropharynx clear. Neck: Supple, no elevated JVP or carotid bruits, no thyromegaly. Lungs: Decreased breath sounds without wheezes, nonlabored breathing at rest. Cardiac: Irregularly irregular, distant, no S3 or significant systolic murmur, no pericardial rub. Abdomen: Soft, nontender, bowel sounds present. Extremities: No pitting edema, distal pulses 2+. Skin: Warm and dry. Musculoskeletal: No kyphosis. Neuropsychiatric: Alert and oriented x3, affect grossly appropriate.  ECG: I personally reviewed the prior tracing from 04/27/2015 which showed sinus rhythm with right bundle branch block and old inferolateral infarct pattern.  Assessment and Plan:  1. Persistent atrial fibrillation of uncertain duration, possibly recent in the setting of pneumonia and oxygen-dependent COPD. It is not clear entirely how much of her weakness and shortness of breath are related to the atrial fibrillation, but hopefully with better heart rate control she will feel better. Plan is to increase Cardizem CD to 180 mg daily. Obtain CBC and BMET, we might consider trying Eliquis instead of aspirin. Office follow-up arranged.  2. CAD status post previous interventions to the RCA several years ago as outlined above. No active angina symptoms. She was taken off Imdur yesterday at PCP office, does report having some recent low blood pressures. Plan to  keep off this medication for now.  3. Essential hypertension, blood pressure is reasonable today.  4. Oxygen-dependent COPD, functionally limiting.  Current medicines were reviewed with the patient today.   Orders Placed This Encounter  Procedures  . CBC with Differential  . Basic Metabolic Panel (BMET)    Disposition: FU with me in 2 weeks.   Signed, Jonelle SidleSamuel G. McDowell, MD, [facility] CenterFACC 02/27/2016 4:39 PM    [facility] [address], [place], [place] [address] Phone: [phone]; Fax: [phone]

## 2016-02-28 LAB — CBC WITH DIFFERENTIAL/PLATELET
BASOS ABS: 0 {cells}/uL (ref 0–200)
Basophils Relative: 0 %
EOS ABS: 50 {cells}/uL (ref 15–500)
Eosinophils Relative: 1 %
HEMATOCRIT: 42.3 % (ref 35.0–45.0)
HEMOGLOBIN: 12.9 g/dL (ref 11.7–15.5)
LYMPHS ABS: 750 {cells}/uL — AB (ref 850–3900)
Lymphocytes Relative: 15 %
MCH: 29.2 pg (ref 27.0–33.0)
MCHC: 30.5 g/dL — ABNORMAL LOW (ref 32.0–36.0)
MCV: 95.7 fL (ref 80.0–100.0)
MPV: 9.5 fL (ref 7.5–12.5)
Monocytes Absolute: 450 cells/uL (ref 200–950)
Monocytes Relative: 9 %
NEUTROS ABS: 3750 {cells}/uL (ref 1500–7800)
Neutrophils Relative %: 75 %
Platelets: 167 10*3/uL (ref 140–400)
RBC: 4.42 MIL/uL (ref 3.80–5.10)
RDW: 14.6 % (ref 11.0–15.0)
WBC: 5 10*3/uL (ref 3.8–10.8)

## 2016-02-29 LAB — BASIC METABOLIC PANEL
BUN: 15 mg/dL (ref 7–25)
CALCIUM: 8.9 mg/dL (ref 8.6–10.4)
CO2: 43 mmol/L — AB (ref 20–31)
CREATININE: 0.68 mg/dL (ref 0.60–0.88)
Chloride: 94 mmol/L — ABNORMAL LOW (ref 98–110)
GLUCOSE: 138 mg/dL — AB (ref 65–99)
Potassium: 4 mmol/L (ref 3.5–5.3)
Sodium: 144 mmol/L (ref 135–146)

## 2016-03-05 ENCOUNTER — Ambulatory Visit: Payer: Medicare Other | Admitting: Cardiology

## 2016-03-09 ENCOUNTER — Inpatient Hospital Stay (HOSPITAL_COMMUNITY)
Admission: EM | Admit: 2016-03-09 | Discharge: 2016-03-20 | DRG: 291 | Disposition: A | Discharge status: Skilled Nursing Facility | Payer: Medicare Other | Attending: Internal Medicine | Admitting: Internal Medicine

## 2016-03-09 ENCOUNTER — Encounter (HOSPITAL_COMMUNITY): Payer: Self-pay

## 2016-03-09 ENCOUNTER — Emergency Department (HOSPITAL_COMMUNITY): Payer: Medicare Other

## 2016-03-09 DIAGNOSIS — Z955 Presence of coronary angioplasty implant and graft: Secondary | ICD-10-CM | POA: Diagnosis not present

## 2016-03-09 DIAGNOSIS — J9611 Chronic respiratory failure with hypoxia: Secondary | ICD-10-CM | POA: Diagnosis not present

## 2016-03-09 DIAGNOSIS — I429 Cardiomyopathy, unspecified: Secondary | ICD-10-CM | POA: Diagnosis present

## 2016-03-09 DIAGNOSIS — R0602 Shortness of breath: Secondary | ICD-10-CM | POA: Diagnosis not present

## 2016-03-09 DIAGNOSIS — Z7982 Long term (current) use of aspirin: Secondary | ICD-10-CM

## 2016-03-09 DIAGNOSIS — J690 Pneumonitis due to inhalation of food and vomit: Secondary | ICD-10-CM | POA: Diagnosis present

## 2016-03-09 DIAGNOSIS — I110 Hypertensive heart disease with heart failure: Secondary | ICD-10-CM | POA: Diagnosis present

## 2016-03-09 DIAGNOSIS — J441 Chronic obstructive pulmonary disease with (acute) exacerbation: Secondary | ICD-10-CM | POA: Diagnosis present

## 2016-03-09 DIAGNOSIS — E878 Other disorders of electrolyte and fluid balance, not elsewhere classified: Secondary | ICD-10-CM | POA: Diagnosis present

## 2016-03-09 DIAGNOSIS — J45909 Unspecified asthma, uncomplicated: Secondary | ICD-10-CM | POA: Diagnosis not present

## 2016-03-09 DIAGNOSIS — I5043 Acute on chronic combined systolic (congestive) and diastolic (congestive) heart failure: Secondary | ICD-10-CM | POA: Diagnosis present

## 2016-03-09 DIAGNOSIS — R5381 Other malaise: Secondary | ICD-10-CM | POA: Diagnosis present

## 2016-03-09 DIAGNOSIS — R0600 Dyspnea, unspecified: Secondary | ICD-10-CM | POA: Insufficient documentation

## 2016-03-09 DIAGNOSIS — M6281 Muscle weakness (generalized): Secondary | ICD-10-CM | POA: Diagnosis not present

## 2016-03-09 DIAGNOSIS — E785 Hyperlipidemia, unspecified: Secondary | ICD-10-CM | POA: Diagnosis present

## 2016-03-09 DIAGNOSIS — J9690 Respiratory failure, unspecified, unspecified whether with hypoxia or hypercapnia: Secondary | ICD-10-CM | POA: Diagnosis not present

## 2016-03-09 DIAGNOSIS — E119 Type 2 diabetes mellitus without complications: Secondary | ICD-10-CM | POA: Diagnosis not present

## 2016-03-09 DIAGNOSIS — J9612 Chronic respiratory failure with hypercapnia: Secondary | ICD-10-CM | POA: Diagnosis not present

## 2016-03-09 DIAGNOSIS — R262 Difficulty in walking, not elsewhere classified: Secondary | ICD-10-CM | POA: Diagnosis not present

## 2016-03-09 DIAGNOSIS — J948 Other specified pleural conditions: Secondary | ICD-10-CM | POA: Diagnosis not present

## 2016-03-09 DIAGNOSIS — Z9981 Dependence on supplemental oxygen: Secondary | ICD-10-CM | POA: Diagnosis not present

## 2016-03-09 DIAGNOSIS — I252 Old myocardial infarction: Secondary | ICD-10-CM

## 2016-03-09 DIAGNOSIS — I4891 Unspecified atrial fibrillation: Secondary | ICD-10-CM | POA: Diagnosis not present

## 2016-03-09 DIAGNOSIS — J90 Pleural effusion, not elsewhere classified: Secondary | ICD-10-CM

## 2016-03-09 DIAGNOSIS — E874 Mixed disorder of acid-base balance: Secondary | ICD-10-CM | POA: Diagnosis not present

## 2016-03-09 DIAGNOSIS — I2510 Atherosclerotic heart disease of native coronary artery without angina pectoris: Secondary | ICD-10-CM | POA: Diagnosis not present

## 2016-03-09 DIAGNOSIS — J449 Chronic obstructive pulmonary disease, unspecified: Secondary | ICD-10-CM | POA: Diagnosis not present

## 2016-03-09 DIAGNOSIS — I481 Persistent atrial fibrillation: Secondary | ICD-10-CM | POA: Diagnosis present

## 2016-03-09 DIAGNOSIS — Z79899 Other long term (current) drug therapy: Secondary | ICD-10-CM

## 2016-03-09 DIAGNOSIS — Z9889 Other specified postprocedural states: Secondary | ICD-10-CM | POA: Diagnosis not present

## 2016-03-09 DIAGNOSIS — J9622 Acute and chronic respiratory failure with hypercapnia: Secondary | ICD-10-CM | POA: Diagnosis present

## 2016-03-09 DIAGNOSIS — Z7189 Other specified counseling: Secondary | ICD-10-CM | POA: Diagnosis not present

## 2016-03-09 DIAGNOSIS — Z7901 Long term (current) use of anticoagulants: Secondary | ICD-10-CM | POA: Diagnosis not present

## 2016-03-09 DIAGNOSIS — R410 Disorientation, unspecified: Secondary | ICD-10-CM | POA: Insufficient documentation

## 2016-03-09 DIAGNOSIS — I482 Chronic atrial fibrillation: Secondary | ICD-10-CM | POA: Diagnosis not present

## 2016-03-09 DIAGNOSIS — N179 Acute kidney failure, unspecified: Secondary | ICD-10-CM | POA: Diagnosis present

## 2016-03-09 DIAGNOSIS — Z09 Encounter for follow-up examination after completed treatment for conditions other than malignant neoplasm: Secondary | ICD-10-CM | POA: Diagnosis not present

## 2016-03-09 DIAGNOSIS — E1159 Type 2 diabetes mellitus with other circulatory complications: Secondary | ICD-10-CM | POA: Diagnosis not present

## 2016-03-09 DIAGNOSIS — I509 Heart failure, unspecified: Secondary | ICD-10-CM

## 2016-03-09 DIAGNOSIS — Z515 Encounter for palliative care: Secondary | ICD-10-CM | POA: Diagnosis present

## 2016-03-09 DIAGNOSIS — I10 Essential (primary) hypertension: Secondary | ICD-10-CM | POA: Diagnosis not present

## 2016-03-09 DIAGNOSIS — J9621 Acute and chronic respiratory failure with hypoxia: Secondary | ICD-10-CM | POA: Diagnosis present

## 2016-03-09 DIAGNOSIS — Z87891 Personal history of nicotine dependence: Secondary | ICD-10-CM

## 2016-03-09 DIAGNOSIS — M47894 Other spondylosis, thoracic region: Secondary | ICD-10-CM | POA: Diagnosis not present

## 2016-03-09 DIAGNOSIS — H9190 Unspecified hearing loss, unspecified ear: Secondary | ICD-10-CM | POA: Diagnosis present

## 2016-03-09 DIAGNOSIS — E1165 Type 2 diabetes mellitus with hyperglycemia: Secondary | ICD-10-CM | POA: Diagnosis not present

## 2016-03-09 DIAGNOSIS — J438 Other emphysema: Secondary | ICD-10-CM | POA: Diagnosis not present

## 2016-03-09 LAB — BASIC METABOLIC PANEL
ANION GAP: 9 (ref 5–15)
BUN: 19 mg/dL (ref 6–20)
CALCIUM: 9 mg/dL (ref 8.9–10.3)
CO2: 46 mmol/L — ABNORMAL HIGH (ref 22–32)
Chloride: 87 mmol/L — ABNORMAL LOW (ref 101–111)
Creatinine, Ser: 0.67 mg/dL (ref 0.44–1.00)
Glucose, Bld: 206 mg/dL — ABNORMAL HIGH (ref 65–99)
Potassium: 3.5 mmol/L (ref 3.5–5.1)
SODIUM: 142 mmol/L (ref 135–145)

## 2016-03-09 LAB — CBC
HCT: 48.7 % — ABNORMAL HIGH (ref 36.0–46.0)
HEMOGLOBIN: 14.1 g/dL (ref 12.0–15.0)
MCH: 30 pg (ref 26.0–34.0)
MCHC: 29 g/dL — AB (ref 30.0–36.0)
MCV: 103.6 fL — ABNORMAL HIGH (ref 78.0–100.0)
Platelets: 172 10*3/uL (ref 150–400)
RBC: 4.7 MIL/uL (ref 3.87–5.11)
RDW: 15.1 % (ref 11.5–15.5)
WBC: 7.4 10*3/uL (ref 4.0–10.5)

## 2016-03-09 LAB — TROPONIN I: TROPONIN I: 0.03 ng/mL (ref <0.031)

## 2016-03-09 LAB — GLUCOSE, CAPILLARY: GLUCOSE-CAPILLARY: 171 mg/dL — AB (ref 65–99)

## 2016-03-09 LAB — BRAIN NATRIURETIC PEPTIDE: B NATRIURETIC PEPTIDE 5: 435 pg/mL — AB (ref 0.0–100.0)

## 2016-03-09 MED ORDER — NITROGLYCERIN 0.4 MG SL SUBL: 0.4000 mg | SUBLINGUAL_TABLET | SUBLINGUAL | Status: DC | PRN

## 2016-03-09 MED ORDER — LORAZEPAM 0.5 MG PO TABS
0.5000 mg | ORAL_TABLET | Freq: Every day | ORAL | Status: DC
  Administered 2016-03-10 – 2016-03-20 (×11): 0.5 mg via ORAL
  Filled 2016-03-09 (×11): qty 1

## 2016-03-09 MED ORDER — PANTOPRAZOLE SODIUM 40 MG PO TBEC
40.0000 mg | DELAYED_RELEASE_TABLET | Freq: Every day | ORAL | Status: DC
  Administered 2016-03-09 – 2016-03-20 (×12): 40 mg via ORAL
  Filled 2016-03-09 (×12): qty 1

## 2016-03-09 MED ORDER — ONDANSETRON HCL 4 MG PO TABS: 4.0000 mg | ORAL_TABLET | Freq: Four times a day (QID) | ORAL | Status: DC | PRN

## 2016-03-09 MED ORDER — ACETAMINOPHEN 650 MG RE SUPP: 650.0000 mg | Freq: Four times a day (QID) | RECTAL | Status: DC | PRN

## 2016-03-09 MED ORDER — POLYETHYLENE GLYCOL 3350 17 G PO PACK: 17.0000 g | PACK | Freq: Every day | ORAL | Status: DC | PRN

## 2016-03-09 MED ORDER — FUROSEMIDE 40 MG PO TABS
40.0000 mg | ORAL_TABLET | Freq: Every day | ORAL | Status: DC
  Administered 2016-03-09: 40 mg via ORAL
  Filled 2016-03-09: qty 1

## 2016-03-09 MED ORDER — SODIUM CHLORIDE 0.9% FLUSH
3.0000 mL | Freq: Two times a day (BID) | INTRAVENOUS | Status: DC
  Administered 2016-03-09 – 2016-03-18 (×15): 3 mL via INTRAVENOUS

## 2016-03-09 MED ORDER — ACETAMINOPHEN 500 MG PO TABS
1000.0000 mg | ORAL_TABLET | Freq: Four times a day (QID) | ORAL | Status: DC | PRN
Start: 2016-03-09 — End: 2016-03-10

## 2016-03-09 MED ORDER — ASPIRIN EC 81 MG PO TBEC
81.0000 mg | DELAYED_RELEASE_TABLET | Freq: Every day | ORAL | Status: DC
  Administered 2016-03-10 – 2016-03-12 (×3): 81 mg via ORAL
  Filled 2016-03-09 (×3): qty 1

## 2016-03-09 MED ORDER — IPRATROPIUM-ALBUTEROL 0.5-2.5 (3) MG/3ML IN SOLN: 3.0000 mL | RESPIRATORY_TRACT | Status: DC | PRN

## 2016-03-09 MED ORDER — FUROSEMIDE 10 MG/ML IJ SOLN
40.0000 mg | Freq: Once | INTRAMUSCULAR | Status: AC
  Administered 2016-03-09: 40 mg via INTRAVENOUS
  Filled 2016-03-09: qty 4

## 2016-03-09 MED ORDER — ACETAMINOPHEN 325 MG PO TABS
650.0000 mg | ORAL_TABLET | Freq: Four times a day (QID) | ORAL | Status: DC | PRN
  Administered 2016-03-16: 650 mg via ORAL
  Filled 2016-03-09: qty 2

## 2016-03-09 MED ORDER — DILTIAZEM HCL ER COATED BEADS 180 MG PO CP24: 180.0000 mg | ORAL_CAPSULE | Freq: Every day | ORAL | Status: DC

## 2016-03-09 MED ORDER — ONDANSETRON HCL 4 MG/2ML IJ SOLN
4.0000 mg | Freq: Four times a day (QID) | INTRAMUSCULAR | Status: DC | PRN
  Administered 2016-03-17: 4 mg via INTRAVENOUS
  Filled 2016-03-09: qty 2

## 2016-03-09 NOTE — ED Notes (Signed)
Pt ambulated to commode, pt reports increased SHOB with ambulation. O2 sats decreased to 86% on o2

## 2016-03-09 NOTE — ED Notes (Signed)
Pt up to bedside commode. Assisted back to bed. O2 sats drop to upper 80s with exertion.

## 2016-03-09 NOTE — ED Notes (Signed)
C/o increased shortness of breath today. Patient has history of A-Fib and is scheduled to follow up with cardiologist this week. Patient denies any pain at this time.

## 2016-03-09 NOTE — ED Provider Notes (Signed)
CSN: [id number]     Arrival date & time 03/09/16  1624 History   First MD Initiated Contact with Patient 03/09/16 1639     Chief Complaint  Patient presents with  . Shortness of Breath     HPI patient presents with increasing shortness of breath and weakness over the last few days.  Patient has long history of COPD and wears oxygen at home.  Recently has been diagnosed with atrial fibrillation.  Saw her doctor last week and placed on Cardizem.  Has had increase in ankle swelling.  Denies any fever or chills.   Past Medical History  Diagnosis Date  . Asthma   . Coronary atherosclerosis of native coronary artery     BMS x 3 RCA 1999 [facility]), PTCRA RCA 01/2000, LVEF 51% by MV 2004  . Essential hypertension, benign   . Hyperlipidemia   . Type 2 diabetes mellitus (HCC)   . History of GI bleed   . COPD (chronic obstructive pulmonary disease) (HCC)     Oxygen dependent  . Old inferior wall myocardial infarction     [facility] 1999   Past Surgical History  Procedure Laterality Date  . Cesareen section    . Cholecystectomy    . Exploratory laparotomy    . Partial colectomy     Family History  Problem Relation Age of Onset  . Diabetes Mother   . Diabetes Father   . Heart disease Father   . Cancer Sister   . Cancer Brother   . Diabetes Brother    Social History  Substance Use Topics  . Smoking status: Former Smoker -- 2.00 packs/day for 40 years    Types: Cigarettes    Quit date: 01/13/1998  . Smokeless tobacco: Never Used  . Alcohol Use: No   OB History    No data available     Review of Systems  Constitutional: Negative for fever.  All other systems reviewed and are negative.     Allergies  Codeine and Heparin  Home Medications   Prior to Admission medications   Medication Sig Start Date End Date Taking? Authorizing Provider  acetaminophen (TYLENOL) 500 MG tablet Take 1,000 mg by mouth every 6 (six) hours as needed for mild pain.   Yes Historical Provider, MD  aspirin  EC 81 MG tablet Take 81 mg by mouth daily.   Yes Historical Provider, MD  diltiazem (CARDIZEM CD) 180 MG 24 hr capsule Take 1 capsule (180 mg total) by mouth daily. 02/27/16  Yes Jonelle Sidle, MD  furosemide (LASIX) 40 MG tablet Take 40 mg by mouth daily.     Yes Historical Provider, MD  ipratropium-albuterol (DUONEB) 0.5-2.5 (3) MG/3ML SOLN Take 3 mLs by nebulization as needed.     Yes Historical Provider, MD  LORazepam (ATIVAN) 1 MG tablet Take 0.5 mg by mouth daily.  04/27/12  Yes Historical Provider, MD  omeprazole (PRILOSEC) 20 MG capsule Take 20 mg by mouth at bedtime.    Yes Historical Provider, MD  atorvastatin (LIPITOR) 10 MG tablet Take 10 mg by mouth daily. Reported on 12/04/2015    Historical Provider, MD  nitroGLYCERIN (NITROSTAT) 0.4 MG SL tablet Place 1 tablet (0.4 mg total) under the tongue every 5 (five) minutes as needed for chest pain. 04/24/14   Jonelle Sidle, MD  NON FORMULARY Pt on 2.5 liters of O2 daily    Historical Provider, MD   BP 130/67 mmHg  Pulse 86  Temp(Src) 97.6 F (36.4 C) (Oral)  Resp 29  Ht 5\' 4"  (1.626 m)  Wt 167 lb (75.751 kg)  BMI 28.65 kg/m2  SpO2 94% Physical Exam  Constitutional: She is oriented to person, place, and time. She appears well-developed and well-nourished. No distress.  HENT:  Head: Normocephalic and atraumatic.  Eyes: Pupils are equal, round, and reactive to light.  Neck: Normal range of motion.  Cardiovascular: Normal rate and intact distal pulses.   Pulmonary/Chest: No respiratory distress. She has decreased breath sounds in the left middle field and the left lower field.  Abdominal: Soft. Normal appearance. She exhibits no distension. There is no tenderness. There is no rebound.  Musculoskeletal: Normal range of motion. She exhibits edema.  Neurological: She is alert and oriented to person, place, and time. No cranial nerve deficit.  Skin: Skin is warm and dry. No rash noted.  Psychiatric: She has a normal mood and affect.  Her behavior is normal.  Nursing note and vitals reviewed.   ED Course  Procedures (including critical care time) Labs Review Labs Reviewed  BASIC METABOLIC PANEL - Abnormal; Notable for the following:    Chloride 87 (*)    CO2 46 (*)    Glucose, Bld 206 (*)    All other components within normal limits  CBC - Abnormal; Notable for the following:    HCT 48.7 (*)    MCV 103.6 (*)    MCHC 29.0 (*)    All other components within normal limits  BRAIN NATRIURETIC PEPTIDE - Abnormal; Notable for the following:    B Natriuretic Peptide 435.0 (*)    All other components within normal limits  TROPONIN I    Imaging Review Dg Chest 2 View  03/09/2016  CLINICAL DATA:  Shortness of breath. Weakness. Atrial fibrillation. EXAM: CHEST  2 VIEW COMPARISON:  02/11/2016 FINDINGS: The large left and moderate right pleural effusion. Only about 25% of the left lung is aerated. Left heart border obscured.  Suspected underlying cardiomegaly. Atherosclerotic aortic arch. Thoracic spondylosis. Indistinct pulmonary vasculature. Bony demineralization. IMPRESSION: 1. Large left and moderate right pleural effusions. Only about 25% of the left lung is aerated. 2. Atherosclerosis. 3. Thoracic spondylosis. 4. Indistinct pulmonary vasculature could reflect pulmonary venous hypertension. Electronically Signed   By: Gaylyn RongWalter  Liebkemann M.D.   On: 03/09/2016 17:29   I have personally reviewed and evaluated these images and lab results as part of my medical decision-making.   EKG Interpretation   Date/Time:  Sunday Mar 09 2016 16:39:51 EDT Ventricular Rate:  90 PR Interval:    QRS Duration: 143 QT Interval:  383 QTC Calculation: 469 R Axis:   118 Text Interpretation:  Atrial fibrillation RBBB and LPFB Inferior infarct,  age indeterminate Abnormal ekg Confirmed by Lolita Faulds  MD, Tytiana Coles ([id number]) on  03/09/2016 5:22:47 PM      MDM   Final diagnoses:  Pleural effusion on left  Acute on chronic respiratory failure  with hypoxia (HCC)        Nelva Nayobert Dorise Gangi, MD 03/09/16 1859

## 2016-03-09 NOTE — ED Notes (Signed)
Report given to floor. Pt ready for transport. 

## 2016-03-09 NOTE — H&P (Signed)
History and Physical  Kathy IslandVirginia L Fix JYN:[id number] DOB: 04/10/1931 DOA: 03/09/2016  Referring physician: Dr. Radford PaxBeaton, ED physician PCP: Dwana MelenaZack Hall, MD  Outpatient Specialists:   Dr. Diona BrownerMcDowell (cardiology)  Chief Complaint: Shortness of breath  HPI: [name] is a 82 y.o. female with a history of atrial fibrillation with a chads score of 5 on aspirin and not on anticoagulation secondary to history of GI bleed, coronary artery disease with stenting of the RCA and a history of inferior wall MI in 1999, hypertension, type 2 diabetes, hyperlipidemia.  Patient presents with 3-4 weeks history of worsening shortness of breath. Patient was seen by cardiology for her shortness of breath and was started on Cardizem to control her heart rate. The patient's shortness of breath has become worsened over the past 10 days with limiting ambulation. The patient is only able to ambulate diffuse test before becoming short of breath. Additionally, rest improves her shortness of breath. Her shortness of breath is also worse with lying down. No cough, wheeze, weight gain.   Review of Systems:   Pt denies any fevers, chills, nausea, vomiting, diarrhea, constipation, abdominal pain, wheezing, palpitations, headache, vision changes, lightheadedness, dizziness, melena, rectal bleeding.  Review of systems are otherwise negative  Past Medical History  Diagnosis Date  . Asthma   . Coronary atherosclerosis of native coronary artery     BMS x 3 RCA 1999 [facility] PTCRA RCA 01/2000, LVEF 51% by MV 2004  . Essential hypertension, benign   . Hyperlipidemia   . Type 2 diabetes mellitus (HCC)   . History of GI bleed   . COPD (chronic obstructive pulmonary disease) (HCC)     Oxygen dependent  . Old inferior wall myocardial infarction     [facility]   Past Surgical History  Procedure Laterality Date  . Cesareen section    . Cholecystectomy    . Exploratory laparotomy    . Partial colectomy     Social History:   reports that she quit smoking about 18 years ago. Her smoking use included Cigarettes. She has a 80 pack-year smoking history. She has never used smokeless tobacco. She reports that she does not drink alcohol or use illicit drugs. Patient lives at Home  Allergies  Allergen Reactions  . Codeine Nausea Only  . Heparin Other (See Comments)    Causes internal bleeding     Family History  Problem Relation Age of Onset  . Diabetes Mother   . Diabetes Father   . Heart disease Father   . Cancer Sister   . Cancer Brother   . Diabetes Brother       Prior to Admission medications   Medication Sig Start Date End Date Taking? Authorizing Provider  acetaminophen (TYLENOL) 500 MG tablet Take 1,000 mg by mouth every 6 (six) hours as needed for mild pain.   Yes Historical Provider, MD  aspirin EC 81 MG tablet Take 81 mg by mouth daily.   Yes Historical Provider, MD  diltiazem (CARDIZEM CD) 180 MG 24 hr capsule Take 1 capsule (180 mg total) by mouth daily. 02/27/16  Yes Jonelle SidleSamuel G McDowell, MD  furosemide (LASIX) 40 MG tablet Take 40 mg by mouth daily.     Yes Historical Provider, MD  ipratropium-albuterol (DUONEB) 0.5-2.5 (3) MG/3ML SOLN Take 3 mLs by nebulization as needed.     Yes Historical Provider, MD  LORazepam (ATIVAN) 1 MG tablet Take 0.5 mg by mouth daily.  04/27/12  Yes Historical Provider, MD  omeprazole (  PRILOSEC) 20 MG capsule Take 20 mg by mouth at bedtime.    Yes Historical Provider, MD  atorvastatin (LIPITOR) 10 MG tablet Take 10 mg by mouth daily. Reported on 12/04/2015    Historical Provider, MD  nitroGLYCERIN (NITROSTAT) 0.4 MG SL tablet Place 1 tablet (0.4 mg total) under the tongue every 5 (five) minutes as needed for chest pain. 04/24/14   Jonelle Sidle, MD  NON FORMULARY Pt on 2.5 liters of O2 daily    Historical Provider, MD    Physical Exam: BP 130/67 mmHg  Pulse 86  Temp(Src) 97.6 F (36.4 C) (Oral)  Resp 29  Ht 5\' 4"  (1.626 m)  Wt 75.751 kg (167 lb)  BMI 28.65 kg/m2   SpO2 94%  General: Elderly Caucasian female. Awake and alert and oriented x3. No acute cardiopulmonary distress.  HEENT: Normocephalic atraumatic.  Right and left ears normal in appearance.  Pupils equal, round, reactive to light. Extraocular muscles are intact. Sclerae anicteric and noninjected.  Moist mucosal membranes. No mucosal lesions.  Neck: Neck supple without lymphadenopathy. No carotid bruits. No masses palpated.  Cardiovascular: Regular rate with normal S1-S2 sounds. No murmurs, rubs, gallops auscultated. No JVD.  Respiratory: Diminished breath sounds in the left base to mid lung field. No wheezes. Mild rales. Abdomen: Soft, nontender, nondistended. Active bowel sounds. No masses or hepatosplenomegaly  Skin: No rashes, lesions, or ulcerations.  Dry, warm to touch. 2+ dorsalis pedis and radial pulses. Musculoskeletal: No calf or leg pain. All major joints not erythematous nontender.  No upper or lower joint deformation.  Good ROM.  No contractures  Psychiatric: Intact judgment and insight. Pleasant and cooperative. Neurologic: No focal neurological deficits. Strength is 5/5 and symmetric in upper and lower extremities.  Cranial nerves II through XII are grossly intact.           Labs on Admission: I have personally reviewed following labs and imaging studies  CBC:  Recent Labs Lab 03/09/16 1640  WBC 7.4  HGB 14.1  HCT 48.7*  MCV 103.6*  PLT 172   Basic Metabolic Panel:  Recent Labs Lab 03/09/16 1640  NA 142  K 3.5  CL 87*  CO2 46*  GLUCOSE 206*  BUN 19  CREATININE 0.67  CALCIUM 9.0   GFR: Estimated Creatinine Clearance: 54 mL/min (by C-G formula based on Cr of 0.67). Liver Function Tests: No results for input(s): AST, ALT, ALKPHOS, BILITOT, PROT, ALBUMIN in the last 168 hours. No results for input(s): LIPASE, AMYLASE in the last 168 hours. No results for input(s): AMMONIA in the last 168 hours. Coagulation Profile: No results for input(s): INR, PROTIME in  the last 168 hours. Cardiac Enzymes:  Recent Labs Lab 03/09/16 1640  TROPONINI 0.03   BNP (last 3 results) No results for input(s): PROBNP in the last 8760 hours. HbA1C: No results for input(s): HGBA1C in the last 72 hours. CBG: No results for input(s): GLUCAP in the last 168 hours. Lipid Profile: No results for input(s): CHOL, HDL, LDLCALC, TRIG, CHOLHDL, LDLDIRECT in the last 72 hours. Thyroid Function Tests: No results for input(s): TSH, T4TOTAL, FREET4, T3FREE, THYROIDAB in the last 72 hours. Anemia Panel: No results for input(s): VITAMINB12, FOLATE, FERRITIN, TIBC, IRON, RETICCTPCT in the last 72 hours. Urine analysis: No results found for: COLORURINE, APPEARANCEUR, LABSPEC, PHURINE, GLUCOSEU, HGBUR, BILIRUBINUR, KETONESUR, PROTEINUR, UROBILINOGEN, NITRITE, LEUKOCYTESUR Sepsis Labs: @LABRCNTIP (procalcitonin:4,lacticidven:4) )No results found for this or any previous visit (from the past 240 hour(s)).   Radiological Exams on Admission: Dg Chest  2 View  03/09/2016  CLINICAL DATA:  Shortness of breath. Weakness. Atrial fibrillation. EXAM: CHEST  2 VIEW COMPARISON:  02/11/2016 FINDINGS: The large left and moderate right pleural effusion. Only about 25% of the left lung is aerated. Left heart border obscured.  Suspected underlying cardiomegaly. Atherosclerotic aortic arch. Thoracic spondylosis. Indistinct pulmonary vasculature. Bony demineralization. IMPRESSION: 1. Large left and moderate right pleural effusions. Only about 25% of the left lung is aerated. 2. Atherosclerosis. 3. Thoracic spondylosis. 4. Indistinct pulmonary vasculature could reflect pulmonary venous hypertension. Electronically Signed   By: Gaylyn Rong M.D.   On: 03/09/2016 17:29   EKG: Independently reviewed. A. fib with right bundle branch block.  Old inferior MI. No acute ST elevation or depression  Assessment/Plan: Active Problems:   Type 2 diabetes mellitus (HCC)   Acute on chronic respiratory failure  with hypoxia (HCC)   Pleural effusion on left   Atrial fibrillation [facility])    This patient was discussed with the ED physician, including pertinent vitals, physical exam findings, labs, and imaging.  We also discussed care given by the ED provider.  #1 acute on chronic respiratory failure with hypoxia  Admits  Continue oxygen therapy #2 pleural effusion on the left  No evidence that this is infectious in process  We'll schedule the patient for thoracentesis with interventional radiology  Nothing by mouth after midnight  We'll send fluid for culture, LDH, cytology, cell count.  Repeat CBC and metabolic panel in the morning, as well as serum LDH #3 atrial fibrillation  On aspirin  Hold anticoagulation for procedure  SCDs #4 diabetes type 2 - diet controlled  Carb modified diet  DVT prophylaxis: SCDs as patient undergoing procedure tomorrow Consultants: Interventional radiology Code Status: Full code Family Communication: Daughter in the room  Disposition Plan: Admit   Levie Heritage, DO [facility] Pager [phone]  If 7PM-7AM, please contact night-coverage [email or web address] Password [email or web address]

## 2016-03-10 ENCOUNTER — Inpatient Hospital Stay (HOSPITAL_COMMUNITY): Payer: Medicare Other

## 2016-03-10 ENCOUNTER — Ambulatory Visit: Payer: Medicare Other | Admitting: Cardiology

## 2016-03-10 DIAGNOSIS — J948 Other specified pleural conditions: Secondary | ICD-10-CM

## 2016-03-10 DIAGNOSIS — J9621 Acute and chronic respiratory failure with hypoxia: Secondary | ICD-10-CM

## 2016-03-10 DIAGNOSIS — J449 Chronic obstructive pulmonary disease, unspecified: Secondary | ICD-10-CM

## 2016-03-10 DIAGNOSIS — I509 Heart failure, unspecified: Secondary | ICD-10-CM

## 2016-03-10 DIAGNOSIS — I4891 Unspecified atrial fibrillation: Secondary | ICD-10-CM

## 2016-03-10 HISTORY — PX: THORACENTESIS: SHX235

## 2016-03-10 LAB — ECHOCARDIOGRAM COMPLETE
HEIGHTINCHES: 64 in
WEIGHTICAEL: 2659.63 [oz_av]

## 2016-03-10 LAB — CBC
HEMATOCRIT: 43.9 % (ref 36.0–46.0)
HEMOGLOBIN: 12.5 g/dL (ref 12.0–15.0)
MCH: 29.8 pg (ref 26.0–34.0)
MCHC: 28.5 g/dL — ABNORMAL LOW (ref 30.0–36.0)
MCV: 104.5 fL — ABNORMAL HIGH (ref 78.0–100.0)
Platelets: 152 10*3/uL (ref 150–400)
RBC: 4.2 MIL/uL (ref 3.87–5.11)
RDW: 15.2 % (ref 11.5–15.5)
WBC: 6.7 10*3/uL (ref 4.0–10.5)

## 2016-03-10 LAB — BODY FLUID CELL COUNT WITH DIFFERENTIAL
EOS FL: 0 %
Lymphs, Fluid: 84 %
Monocyte-Macrophage-Serous Fluid: 13 % — ABNORMAL LOW (ref 50–90)
Neutrophil Count, Fluid: 3 % (ref 0–25)
Total Nucleated Cell Count, Fluid: 362 cu mm (ref 0–1000)

## 2016-03-10 LAB — HEPATIC FUNCTION PANEL
ALT: 16 U/L (ref 14–54)
AST: 16 U/L (ref 15–41)
Albumin: 3.4 g/dL — ABNORMAL LOW (ref 3.5–5.0)
Alkaline Phosphatase: 42 U/L (ref 38–126)
BILIRUBIN INDIRECT: 0.6 mg/dL (ref 0.3–0.9)
Bilirubin, Direct: 0.1 mg/dL (ref 0.1–0.5)
TOTAL PROTEIN: 5.8 g/dL — AB (ref 6.5–8.1)
Total Bilirubin: 0.7 mg/dL (ref 0.3–1.2)

## 2016-03-10 LAB — BASIC METABOLIC PANEL
ANION GAP: 8 (ref 5–15)
BUN: 19 mg/dL (ref 6–20)
CHLORIDE: 86 mmol/L — AB (ref 101–111)
CO2: 48 mmol/L — ABNORMAL HIGH (ref 22–32)
Calcium: 8.5 mg/dL — ABNORMAL LOW (ref 8.9–10.3)
Creatinine, Ser: 0.54 mg/dL (ref 0.44–1.00)
GFR calc Af Amer: >60 mL/min (ref >60)
GLUCOSE: 135 mg/dL — AB (ref 65–99)
POTASSIUM: 3.5 mmol/L (ref 3.5–5.1)
SODIUM: 142 mmol/L (ref 135–145)

## 2016-03-10 LAB — GLUCOSE, CAPILLARY
GLUCOSE-CAPILLARY: 103 mg/dL — AB (ref 65–99)
Glucose-Capillary: 126 mg/dL — ABNORMAL HIGH (ref 65–99)
Glucose-Capillary: 143 mg/dL — ABNORMAL HIGH (ref 65–99)
Glucose-Capillary: 182 mg/dL — ABNORMAL HIGH (ref 65–99)

## 2016-03-10 LAB — GRAM STAIN

## 2016-03-10 LAB — LACTATE DEHYDROGENASE: LDH: 154 U/L (ref 98–192)

## 2016-03-10 LAB — PROTIME-INR
INR: 1.04 (ref 0.00–1.49)
Prothrombin Time: 13.8 seconds (ref 11.6–15.2)

## 2016-03-10 LAB — PROTEIN, BODY FLUID

## 2016-03-10 LAB — LACTATE DEHYDROGENASE, PLEURAL OR PERITONEAL FLUID: LD, Fluid: 63 U/L — ABNORMAL HIGH (ref 3–23)

## 2016-03-10 LAB — BRAIN NATRIURETIC PEPTIDE: B NATRIURETIC PEPTIDE 5: 393 pg/mL — AB (ref 0.0–100.0)

## 2016-03-10 LAB — GLUCOSE, SEROUS FLUID: Glucose, Fluid: 151 mg/dL

## 2016-03-10 MED ORDER — HEPARIN SODIUM (PORCINE) 5000 UNIT/ML IJ SOLN
5000.0000 [IU] | Freq: Three times a day (TID) | INTRAMUSCULAR | Status: DC
  Administered 2016-03-10 – 2016-03-13 (×9): 5000 [IU] via SUBCUTANEOUS
  Filled 2016-03-10 (×9): qty 1

## 2016-03-10 MED ORDER — POTASSIUM CHLORIDE CRYS ER 20 MEQ PO TBCR
40.0000 meq | EXTENDED_RELEASE_TABLET | Freq: Once | ORAL | Status: AC
  Administered 2016-03-10: 40 meq via ORAL
  Filled 2016-03-10: qty 2

## 2016-03-10 MED ORDER — DILTIAZEM HCL ER COATED BEADS 240 MG PO CP24
240.0000 mg | ORAL_CAPSULE | Freq: Every day | ORAL | Status: DC
  Administered 2016-03-10 – 2016-03-11 (×2): 240 mg via ORAL
  Filled 2016-03-10 (×2): qty 1

## 2016-03-10 MED ORDER — FUROSEMIDE 40 MG PO TABS
40.0000 mg | ORAL_TABLET | Freq: Two times a day (BID) | ORAL | Status: DC
  Administered 2016-03-10 (×2): 40 mg via ORAL
  Filled 2016-03-10 (×3): qty 1

## 2016-03-10 MED ORDER — CETYLPYRIDINIUM CHLORIDE 0.05 % MT LIQD
7.0000 mL | Freq: Two times a day (BID) | OROMUCOSAL | Status: DC
  Administered 2016-03-10 – 2016-03-11 (×4): 7 mL via OROMUCOSAL

## 2016-03-10 NOTE — Procedures (Signed)
PreOperative Dx: LEFT pleural effusion Postoperative Dx: LEFT pleural effusion Procedure:   US guided LEFT thoracentesis Radiologist:  Tyron RussellBoles Anesthesia:  10 ml of 1% lidocaine Specimen:  1000 ml of amber colored fluid EBL:   < 1 ml Complications: None

## 2016-03-10 NOTE — Progress Notes (Signed)
Thoracentesis complete no signs of distress. 1000 ml yellow colored pleural fluid removed.  

## 2016-03-10 NOTE — Progress Notes (Signed)
PROGRESS NOTE                                                                                                                                                                                                             Patient Demographics:    [name], is a 82 y.o. female, DOB - 11-Aug-1931, ZOX:[id number]  Admit date - 03/09/2016   Admitting Physician Levie Heritage, DO  Outpatient Primary MD for the patient is [name], MD  LOS - 1  Chief Complaint  Patient presents with  . Shortness of Breath       Brief Narrative     [name] is a 82 y.o. female with a history of atrial fibrillation with a chads score of 5 on aspirin and not on anticoagulation secondary to history of GI bleed, coronary artery disease with stenting of the RCA and a history of inferior wall MI in 1999, hypertension, type 2 diabetes, hyperlipidemia. Patient presents with 3-4 weeks history of worsening shortness of breath. Patient was seen by cardiology for her shortness of breath and was started on Cardizem to control her heart rate. The patient's shortness of breath has become worsened over the past 10 days with limiting ambulation. The patient is only able to ambulate diffuse test before becoming short of breath. Additionally, rest improves her shortness of breath. Her shortness of breath is also worse with lying down. No cough, wheeze, weight gain.    Subjective:    [place] today has, No headache, No chest pain, No abdominal pain - No Nausea, No new weakness tingling or numbness, No Cough - mild SOB.   Assessment  & Plan :     1.Acute on chronic hypoxic respiratory failure due to bilateral pleural effusions left more than right. This most likely is secondary to underlying diastolic CHF brought on by new onset atrial fibrillation, Lasix dose has been increased, requested radiology to do thoracentesis left today and then possibly  right later, oxygen supplementation to continue along with supportive care with nebulizer treatments, have consulted pulmonary and cardiology. Appropriate pleural fluid studies have been ordered including cytology. This likely will be transudative fluid from CHF.  2. New diagnosis of persistent atrial fibrillation. [place] vasc 2 score of close to 5. Currently on Cardizem and aspirin, she was following with cardiology outpatient and anticoagulation  was being considered, have requested cardiology to evaluate, echogram ordered, different type evaluation to cardiology for now goal will be rate controlled.  3. New diagnosis of diastolic CHF. Get echogram. No previous echo in chart. Lasix dose increased, thoracentesis as above.  4. Diet-controlled type 2 diabetes mellitus. Will monitor.  5. ? HX of COPD. On home oxygen, currently no wheezing. Supportive care. Has never seen a pulmonologist before.    Code Status :  Full  Family Communication  :  Daughter  Disposition Plan  :  Home 2-3 days  Consults  :  Cards, Pulm, Radiology  Procedures  :    TTE  Thoracentesis  DVT Prophylaxis  :  Heparin    Lab Results  Component Value Date   PLT 152 03/10/2016    Inpatient Medications  Scheduled Meds: . antiseptic oral rinse  7 mL Mouth Rinse BID  . aspirin EC  81 mg Oral Daily  . diltiazem  180 mg Oral Daily  . furosemide  40 mg Oral BID  . LORazepam  0.5 mg Oral Daily  . pantoprazole  40 mg Oral Daily  . potassium chloride  40 mEq Oral Once  . sodium chloride flush  3 mL Intravenous Q12H   Continuous Infusions:  PRN Meds:.acetaminophen **OR** [DISCONTINUED] acetaminophen, ipratropium-albuterol, nitroGLYCERIN, [DISCONTINUED] ondansetron **OR** ondansetron (ZOFRAN) IV, polyethylene glycol  Antibiotics  :    Anti-infectives    None         Objective:   Filed Vitals:   03/09/16 1930 03/09/16 2000 03/09/16 2023 03/10/16 0516  BP: 125/71  108/61 102/87  Pulse: 95  98 94  Temp:   98.4 F (36.9 C) 98.8 F (37.1 C) 97.8 F (36.6 C)  TempSrc:  Oral Oral Oral  Resp: 30  30   Height:   5\' 4"  (1.626 m)   Weight:   75.8 kg (167 lb 1.7 oz) 75.4 kg (166 lb 3.6 oz)  SpO2: 93%  90% 96%    Wt Readings from Last 3 Encounters:  03/10/16 75.4 kg (166 lb 3.6 oz)  02/27/16 75.751 kg (167 lb)  12/04/15 74.299 kg (163 lb 12.8 oz)     Intake/Output Summary (Last 24 hours) at 03/10/16 0755 Last data filed at 03/10/16 0529  Gross per 24 hour  Intake      0 ml  Output    800 ml  Net   -800 ml     Physical Exam  Awake Alert, Oriented X 3, No new F.N deficits, Normal affect [place].AT,PERRAL Supple Neck,No JVD, No cervical lymphadenopathy appriciated.  Symmetrical Chest wall movement, Mod air movement bilaterally,  Reduced breath sounds in both bases, rales at the bases RRR,No Gallops,Rubs or new Murmurs, No Parasternal Heave +ve B.Sounds, Abd Soft, No tenderness, No organomegaly appriciated, No rebound - guarding or rigidity. No Cyanosis, Clubbing or edema, No new Rash or bruise     Data Review:    CBC  Recent Labs Lab 03/09/16 1640 03/10/16 0445  WBC 7.4 6.7  HGB 14.1 12.5  HCT 48.7* 43.9  PLT 172 152  MCV 103.6* 104.5*  MCH 30.0 29.8  MCHC 29.0* 28.5*  RDW 15.1 15.2    Chemistries   Recent Labs Lab 03/09/16 1640 03/10/16 0445 03/10/16 0625  NA 142 142  --   K 3.5 3.5  --   CL 87* 86*  --   CO2 46* 48*  --   GLUCOSE 206* 135*  --   BUN 19 19  --  CREATININE 0.67 0.54  --   CALCIUM 9.0 8.5*  --   AST  --   --  16  ALT  --   --  16  ALKPHOS  --   --  42  BILITOT  --   --  0.7   ------------------------------------------------------------------------------------------------------------------ No results for input(s): CHOL, HDL, LDLCALC, TRIG, CHOLHDL, LDLDIRECT in the last 72 hours.  No results found for: HGBA1C ------------------------------------------------------------------------------------------------------------------ No results for  input(s): TSH, T4TOTAL, T3FREE, THYROIDAB in the last 72 hours.  Invalid input(s): FREET3 ------------------------------------------------------------------------------------------------------------------ No results for input(s): VITAMINB12, FOLATE, FERRITIN, TIBC, IRON, RETICCTPCT in the last 72 hours.  Coagulation profile  Recent Labs Lab 03/10/16 0625  INR 1.04    No results for input(s): DDIMER in the last 72 hours.  Cardiac Enzymes  Recent Labs Lab 03/09/16 1640  TROPONINI 0.03   ------------------------------------------------------------------------------------------------------------------    Component Value Date/Time   BNP 393.0* 03/10/2016 [id number]    Micro Results No results found for this or any previous visit (from the past 240 hour(s)).  Radiology Reports Dg Chest 2 View  03/09/2016  CLINICAL DATA:  Shortness of breath. Weakness. Atrial fibrillation. EXAM: CHEST  2 VIEW COMPARISON:  02/11/2016 FINDINGS: The large left and moderate right pleural effusion. Only about 25% of the left lung is aerated. Left heart border obscured.  Suspected underlying cardiomegaly. Atherosclerotic aortic arch. Thoracic spondylosis. Indistinct pulmonary vasculature. Bony demineralization. IMPRESSION: 1. Large left and moderate right pleural effusions. Only about 25% of the left lung is aerated. 2. Atherosclerosis. 3. Thoracic spondylosis. 4. Indistinct pulmonary vasculature could reflect pulmonary venous hypertension. Electronically Signed   By: Gaylyn RongWalter  Liebkemann M.D.   On: 03/09/2016 17:29   Dg Chest 2 View  02/11/2016  CLINICAL DATA:  Shortness of breath for 2 weeks EXAM: CHEST  2 VIEW COMPARISON:  01/14/2010 FINDINGS: Cardiac shadow is enlarged. The lungs are well aerated bilaterally. No focal infiltrate or sizable effusion is seen. Degenerative changes of the thoracic spine are noted. Some linear scarring is noted in the midportion of the lungs bilaterally. IMPRESSION: No acute  abnormality noted. Electronically Signed   By: Alcide CleverMark  Lukens M.D.   On: 02/11/2016 13:41    Time Spent in minutes  30   Tarynn Garling K M.D on 03/10/2016 at 7:55 AM  Between 7am to 7pm - Pager - [phone]  After 7pm go to [email or web address] - password [email or web address]  [facility] -  Office  [phone]

## 2016-03-10 NOTE — Consult Note (Signed)
Consult requested by: [facility] Consult requested for acute on chronic hypoxic respiratory failure:  HPI: This is an 82 year old who had been in her usual state of fair health at home when she developed increasing shortness of breath. This seemed to be related to developing atrial fibrillation she thinks about a month ago. This was being worked up but she developed increasing problems with shortness of breath. She came to the emergency department she was found to have large pleural effusions and evidence of heart failure which was thought to be precipitated by her atrial fibrillation. She says normally at home she is able to get around and do what she needs to do. She is not very short of breath. She does wear oxygen 24 7. She is not coughing or congested. She does not have any hemoptysis night sweats fever or chills  Past Medical History  Diagnosis Date  . Essential hypertension, benign   . Hyperlipidemia   . History of GI bleed   . COPD (chronic obstructive pulmonary disease) (HCC)     Oxygen dependent  . Old inferior wall myocardial infarction     [facility] 1999  . Asthma   . Type 2 diabetes mellitus (HCC)   . Coronary atherosclerosis of native coronary artery     BMS x 3 RCA 1999 [facility] PTCRA RCA 01/2000, LVEF 51% by MV 2004     Family History  Problem Relation Age of Onset  . Diabetes Mother   . Diabetes Father   . Heart disease Father   . Cancer Sister   . Cancer Brother   . Diabetes Brother      Social History   Social History  . Marital Status: Widowed    Spouse Name: N/A  . Number of Children: N/A  . Years of Education: N/A   Social History Main Topics  . Smoking status: Former Smoker -- 2.00 packs/day for 40 years    Types: Cigarettes    Quit date: 01/13/1998  . Smokeless tobacco: Never Used  . Alcohol Use: No  . Drug Use: No  . Sexual Activity: Not Asked   Other Topics Concern  . None   Social History Narrative     ROS: Except as mentioned is  negative. She has had some swelling of her legs. Otherwise per the history and physical which I have reviewed    Objective: Vital signs in last 24 hours: Temp:  [97.6 F (36.4 C)-98.8 F (37.1 C)] 97.8 F (36.6 C) (05/22 0516) Pulse Rate:  [85-98] 94 (05/22 0516) Resp:  [16-34] 30 (05/21 2023) BP: (102-143)/(61-87) 102/87 mmHg (05/22 0516) SpO2:  [90 %-96 %] 96 % (05/22 0516) Weight:  [75.4 kg (166 lb 3.6 oz)-75.8 kg (167 lb 1.7 oz)] 75.4 kg (166 lb 3.6 oz) (05/22 0516) Weight change:     Intake/Output from previous day: 05/21 0701 - 05/22 0700 In: -  Out: 800 [Urine:800]  PHYSICAL EXAM She is awake and alert. She looks mildly short of breath with minimal exertion. Her pupils are reactive nose and throat clear mucous membranes are moist her neck is supple without masses. Her heart is irregularly irregular but I don't gallop. She has diminished breath sounds bilaterally and prolonged expiratory phase. Her abdomen is soft without masses. She has trace edema. Central nervous system examination is grossly intact  Lab Results: Basic Metabolic Panel:  Recent Labs  16/07/9604/21/17 1640 03/10/16 0445  NA 142 142  K 3.5 3.5  CL 87* 86*  CO2 46* 48*  GLUCOSE 206* 135*  BUN 19 19  CREATININE 0.67 0.54  CALCIUM 9.0 8.5*   Liver Function Tests:  Recent Labs  03/10/16 0625  AST 16  ALT 16  ALKPHOS 42  BILITOT 0.7  PROT 5.8*  ALBUMIN 3.4*   No results for input(s): LIPASE, AMYLASE in the last 72 hours. No results for input(s): AMMONIA in the last 72 hours. CBC:  Recent Labs  03/09/16 1640 03/10/16 0445  WBC 7.4 6.7  HGB 14.1 12.5  HCT 48.7* 43.9  MCV 103.6* 104.5*  PLT 172 152   Cardiac Enzymes:  Recent Labs  03/09/16 1640  TROPONINI 0.03   BNP: No results for input(s): PROBNP in the last 72 hours. D-Dimer: No results for input(s): DDIMER in the last 72 hours. CBG:  Recent Labs  03/09/16 2019 03/10/16 0728  GLUCAP 171* 103*   Hemoglobin A1C: No  results for input(s): HGBA1C in the last 72 hours. Fasting Lipid Panel: No results for input(s): CHOL, HDL, LDLCALC, TRIG, CHOLHDL, LDLDIRECT in the last 72 hours. Thyroid Function Tests: No results for input(s): TSH, T4TOTAL, FREET4, T3FREE, THYROIDAB in the last 72 hours. Anemia Panel: No results for input(s): VITAMINB12, FOLATE, FERRITIN, TIBC, IRON, RETICCTPCT in the last 72 hours. Coagulation:  Recent Labs  03/10/16 0625  LABPROT 13.8  INR 1.04   Urine Drug Screen: Drugs of Abuse  No results found for: LABOPIA, COCAINSCRNUR, LABBENZ, AMPHETMU, THCU, LABBARB  Alcohol Level: No results for input(s): ETH in the last 72 hours. Urinalysis: No results for input(s): COLORURINE, LABSPEC, PHURINE, GLUCOSEU, HGBUR, BILIRUBINUR, KETONESUR, PROTEINUR, UROBILINOGEN, NITRITE, LEUKOCYTESUR in the last 72 hours.  Invalid input(s): APPERANCEUR Misc. Labs:   ABGS: No results for input(s): PHART, PO2ART, TCO2, HCO3 in the last 72 hours.  Invalid input(s): PCO2   MICROBIOLOGY: No results found for this or any previous visit (from the past 240 hour(s)).  Studies/Results: Dg Chest 2 View  03/09/2016  CLINICAL DATA:  Shortness of breath. Weakness. Atrial fibrillation. EXAM: CHEST  2 VIEW COMPARISON:  02/11/2016 FINDINGS: The large left and moderate right pleural effusion. Only about 25% of the left lung is aerated. Left heart border obscured.  Suspected underlying cardiomegaly. Atherosclerotic aortic arch. Thoracic spondylosis. Indistinct pulmonary vasculature. Bony demineralization. IMPRESSION: 1. Large left and moderate right pleural effusions. Only about 25% of the left lung is aerated. 2. Atherosclerosis. 3. Thoracic spondylosis. 4. Indistinct pulmonary vasculature could reflect pulmonary venous hypertension. Electronically Signed   By: Gaylyn Rong M.D.   On: 03/09/2016 17:29    Medications:  Prior to Admission:  Prescriptions prior to admission  Medication Sig Dispense Refill  Last Dose  . acetaminophen (TYLENOL) 500 MG tablet Take 1,000 mg by mouth every 6 (six) hours as needed for mild pain.   03/08/2016  . aspirin EC 81 MG tablet Take 81 mg by mouth daily.   03/09/2016  . diltiazem (CARDIZEM CD) 180 MG 24 hr capsule Take 1 capsule (180 mg total) by mouth daily. 90 capsule 3 03/09/2016  . furosemide (LASIX) 40 MG tablet Take 40 mg by mouth daily.     03/09/2016  . ipratropium-albuterol (DUONEB) 0.5-2.5 (3) MG/3ML SOLN Take 3 mLs by nebulization as needed.     Past Month  . LORazepam (ATIVAN) 1 MG tablet Take 0.5 mg by mouth daily.    03/09/2016  . omeprazole (PRILOSEC) 20 MG capsule Take 20 mg by mouth at bedtime.    03/08/2016  . atorvastatin (LIPITOR) 10 MG tablet Take 10 mg by mouth  daily. Reported on 12/04/2015   Taking  . nitroGLYCERIN (NITROSTAT) 0.4 MG SL tablet Place 1 tablet (0.4 mg total) under the tongue every 5 (five) minutes as needed for chest pain. 25 tablet 3 Never  . NON FORMULARY Pt on 2.5 liters of O2 daily   Taking   Scheduled: . antiseptic oral rinse  7 mL Mouth Rinse BID  . aspirin EC  81 mg Oral Daily  . diltiazem  240 mg Oral Daily  . furosemide  40 mg Oral BID  . heparin subcutaneous  5,000 Units Subcutaneous Q8H  . LORazepam  0.5 mg Oral Daily  . pantoprazole  40 mg Oral Daily  . potassium chloride  40 mEq Oral Once  . sodium chloride flush  3 mL Intravenous Q12H   Continuous:  BJY:[id number] **OR** [DISCONTINUED] acetaminophen, ipratropium-albuterol, nitroGLYCERIN, [DISCONTINUED] ondansetron **OR** ondansetron (ZOFRAN) IV, polyethylene glycol  Assesment: She has acute on chronic hypoxic respiratory failure which I think is a combination of COPD and probable diastolic heart failure. She has pleural effusions and is set for thoracentesis today. I agree that is going to be helpful. She has chronic atrial fibrillation. She is being diuresed and says she feels better. Principal Problem:   Acute on chronic respiratory failure with hypoxia  (HCC) Active Problems:   Type 2 diabetes mellitus (HCC)   COPD (chronic obstructive pulmonary disease) (HCC)   Pleural effusion on left   Atrial fibrillation (HCC)    Plan: Thoracentesis today. Continue diuresis. Echocardiogram to check on diastolic function. Continue oxygen.    LOS: 1 day   Eulogio Requena L 03/10/2016, 9:05 AM

## 2016-03-10 NOTE — Care Management Important Message (Signed)
Important Message  Patient Details  Name: [name] MRN: [id number] Date of Birth: 12/11/1930   Medicare Important Message Given:  Yes    Clancy Mullarkey, [name], RN 03/10/2016, 9:06 AM

## 2016-03-10 NOTE — Consult Note (Signed)
Requesting provider: Dr. Susa Raring Primary cardiologist: Dr. Jonelle Sidle Consulting cardiologist: Dr. Jonelle Sidle  Reason for consultation: Atrial fibrillation, pleural effusions and suspected diastolic heart failure  Clinical Summary [name] is an 82 y.o.female seen in the office on May 10 with recently diagnosed atrial fibrillation associated with RVR. She was placed on Cardizem CD with dose titrated to 180 mg daily, follow-up CBC and BMET also obtained to assess for potential switch from aspirin to Eliquis for stroke prophylaxis. CHADSVASC score is 5. She was actually scheduled to see me in the office this afternoon, but presented to the hospital over the weekend with worsening shortness of breath. She has been found to have bilateral pleural effusions, left larger than right with presumed diastolic heart failure exacerbation. At this point she is pending therapeutic thoracentesis by IR as arranged by the primary team.  She does not report any chest pain recently, fatigue and worsening dyspnea are her main symptoms. No cough, fevers or chills. She has known COPD that is oxygen requiring. She reports compliance with her medications.  Allergies  Allergen Reactions  . Codeine Nausea Only  . Heparin Other (See Comments)    Causes internal bleeding     Medications Scheduled Medications: . antiseptic oral rinse  7 mL Mouth Rinse BID  . aspirin EC  81 mg Oral Daily  . diltiazem  240 mg Oral Daily  . furosemide  40 mg Oral BID  . heparin subcutaneous  5,000 Units Subcutaneous Q8H  . LORazepam  0.5 mg Oral Daily  . pantoprazole  40 mg Oral Daily  . potassium chloride  40 mEq Oral Once  . sodium chloride flush  3 mL Intravenous Q12H    PRN Medications: acetaminophen **OR** [DISCONTINUED] acetaminophen, ipratropium-albuterol, nitroGLYCERIN, [DISCONTINUED] ondansetron **OR** ondansetron (ZOFRAN) IV, polyethylene glycol   Past Medical History  Diagnosis Date  .  Essential hypertension, benign   . Hyperlipidemia   . History of GI bleed   . COPD (chronic obstructive pulmonary disease) (HCC)     Oxygen dependent  . Old inferior wall myocardial infarction     [facility] 1999  . Asthma   . Type 2 diabetes mellitus (HCC)   . Coronary atherosclerosis of native coronary artery     BMS x 3 RCA 1999 [facility] PTCRA RCA 01/2000, LVEF 51% by MV 2004    Past Surgical History  Procedure Laterality Date  . Cesareen section    . Cholecystectomy    . Exploratory laparotomy    . Partial colectomy      Family History  Problem Relation Age of Onset  . Diabetes Mother   . Diabetes Father   . Heart disease Father   . Cancer Sister   . Cancer Brother   . Diabetes Brother     Social History [name] reports that she quit smoking about 18 years ago. Her smoking use included Cigarettes. She has a 80 pack-year smoking history. She has never used smokeless tobacco. [name] reports that she does not drink alcohol.  Review of Systems Complete review of systems negative except as otherwise outlined in the clinical summary and also the following. Mild orthopnea.  Physical Examination Blood pressure 102/87, pulse 94, temperature 97.8 F (36.6 C), temperature source Oral, resp. rate 30, height  (1.626 m), weight 166 lb 3.6 oz (75.4 kg), SpO2 96 %.  Intake/Output Summary (Last 24 hours) at 03/10/16 0906 Last data filed at 03/10/16 0529  Gross per 24 hour  Intake      0 ml  Output    800 ml  Net   -800 ml   Telemetry: Atrial fibrillation with heart rate in the 90s at rest.  Gen.: Frail-appearing elderly woman in no distress. HEENT: Conjunctiva and lids normal, oropharynx clear. Neck: Supple, elevated JVP, no carotid bruits, no thyromegaly. Lungs: Diminished breath sounds at the bases, at least mid way up on the left with a few crackles in the left mid zone, nonlabored breathing at rest. Cardiac: Irregularly irregular, no S3, 2/6 systolic murmur, no  pericardial rub. Abdomen: Soft, nontender,bowel sounds pre sent, no guarding or rebound. Extremities: Mild ankle edema, distal pulses 2+. Skin: Warm and dry. Musculoskeletal: Mild kyphosis. Neuropsychiatric: Alert and oriented x3, affect grossly appropriate.  Lab Results  Basic Metabolic Panel:  Recent Labs Lab 03/09/16 1640 03/10/16 0445  NA 142 142  K 3.5 3.5  CL 87* 86*  CO2 46* 48*  GLUCOSE 206* 135*  BUN 19 19  CREATININE 0.67 0.54  CALCIUM 9.0 8.5*   Liver Function Tests:  Recent Labs Lab 03/10/16 0625  AST 16  ALT 16  ALKPHOS 42  BILITOT 0.7  PROT 5.8*  ALBUMIN 3.4*   CBC:  Recent Labs Lab 03/09/16 1640 03/10/16 0445  WBC 7.4 6.7  HGB 14.1 12.5  HCT 48.7* 43.9  MCV 103.6* 104.5*  PLT 172 152   Cardiac Enzymes:  Recent Labs Lab 03/09/16 1640  TROPONINI 0.03   BNP: 393  ECG I personally reviewed the tracing from 03/09/2016 which showed atrial fibrillation with right bundle branch block, left posterior fascicular block, old inferior infarct pattern, nonspecific ST changes.   Imaging Chest x-ray 03/09/2016: FINDINGS: The large left and moderate right pleural effusion. Only about 25% of the left lung is aerated.  Left heart border obscured. Suspected underlying cardiomegaly.  Atherosclerotic aortic arch. Thoracic spondylosis. Indistinct pulmonary vasculature. Bony demineralization.  IMPRESSION: 1. Large left and moderate right pleural effusions. Only about 25% of the left lung is aerated. 2. Atherosclerosis. 3. Thoracic spondylosis. 4. Indistinct pulmonary vasculature could reflect pulmonary venous hypertension.  Impression  1. Persistent atrial fibrillation with RVR and CHADSVASC score of 5. Also bleed contributing to acute diastolic heart failure.  2. Increasing shortness of breath with bilateral pleural effusions, left greater than right. Suspected diastolic heart failure exacerbation at this point. She is pending therapeutic  thoracenteses by IR.  3. CAD with previous RCA interventions in 1999 and 2001. No active angina symptoms, troponin I level negative.  4. Oxygen-dependent COPD.  Recommendations  Increase Cardizem CD to 240 mg daily. Continue Lasix and plans for therapeutic thoracentesis. We will eventually consider changing from aspirin to Eliquis once she is more stable and no further invasive testing is planned. Agree with obtaining follow-up echocardiogram to reassess cardiac structure and function - will review.  Jonelle SidleSamuel G. Elisheva Fallas, M.D., F.A.C.C.

## 2016-03-11 ENCOUNTER — Inpatient Hospital Stay (HOSPITAL_COMMUNITY): Payer: Medicare Other

## 2016-03-11 DIAGNOSIS — I4891 Unspecified atrial fibrillation: Secondary | ICD-10-CM | POA: Insufficient documentation

## 2016-03-11 DIAGNOSIS — Z9889 Other specified postprocedural states: Secondary | ICD-10-CM | POA: Insufficient documentation

## 2016-03-11 DIAGNOSIS — J90 Pleural effusion, not elsewhere classified: Secondary | ICD-10-CM

## 2016-03-11 LAB — BLOOD GAS, ARTERIAL
O2 Content: 4 L/min
O2 SAT: 88.5 %
PH ART: 7.222 — AB (ref 7.350–7.450)
PO2 ART: 63.2 mmHg — AB (ref 80.0–100.0)

## 2016-03-11 LAB — GLUCOSE, CAPILLARY
GLUCOSE-CAPILLARY: 177 mg/dL — AB (ref 65–99)
Glucose-Capillary: 122 mg/dL — ABNORMAL HIGH (ref 65–99)
Glucose-Capillary: 178 mg/dL — ABNORMAL HIGH (ref 65–99)
Glucose-Capillary: 109 mg/dL — ABNORMAL HIGH (ref 65–99)

## 2016-03-11 LAB — CBC
HEMATOCRIT: 44.4 % (ref 36.0–46.0)
Hemoglobin: 12.4 g/dL (ref 12.0–15.0)
MCH: 29.5 pg (ref 26.0–34.0)
MCHC: 27.9 g/dL — ABNORMAL LOW (ref 30.0–36.0)
MCV: 105.7 fL — AB (ref 78.0–100.0)
Platelets: 145 10*3/uL — ABNORMAL LOW (ref 150–400)
RBC: 4.2 MIL/uL (ref 3.87–5.11)
RDW: 14.8 % (ref 11.5–15.5)
WBC: 7.2 10*3/uL (ref 4.0–10.5)

## 2016-03-11 LAB — BASIC METABOLIC PANEL
BUN: 16 mg/dL (ref 6–20)
CHLORIDE: 83 mmol/L — AB (ref 101–111)
CO2: >50 mmol/L — ABNORMAL HIGH (ref 22–32)
Calcium: 8.6 mg/dL — ABNORMAL LOW (ref 8.9–10.3)
Creatinine, Ser: 0.72 mg/dL (ref 0.44–1.00)
GFR calc Af Amer: >60 mL/min (ref >60)
GFR calc non Af Amer: >60 mL/min (ref >60)
GLUCOSE: 127 mg/dL — AB (ref 65–99)
POTASSIUM: 3.8 mmol/L (ref 3.5–5.1)
Sodium: 141 mmol/L (ref 135–145)

## 2016-03-11 MED ORDER — INSULIN ASPART 100 UNIT/ML ~~LOC~~ SOLN: 0.0000 [IU] | Freq: Every day | SUBCUTANEOUS | Status: DC

## 2016-03-11 MED ORDER — INSULIN ASPART 100 UNIT/ML ~~LOC~~ SOLN
0.0000 [IU] | Freq: Three times a day (TID) | SUBCUTANEOUS | Status: DC
  Administered 2016-03-11 (×2): 2 [IU] via SUBCUTANEOUS

## 2016-03-11 MED ORDER — CHLORHEXIDINE GLUCONATE 0.12 % MT SOLN
15.0000 mL | Freq: Two times a day (BID) | OROMUCOSAL | Status: DC
  Administered 2016-03-12 (×2): 15 mL via OROMUCOSAL
  Filled 2016-03-11: qty 15

## 2016-03-11 MED ORDER — DILTIAZEM HCL 60 MG PO TABS
60.0000 mg | ORAL_TABLET | Freq: Four times a day (QID) | ORAL | Status: DC
  Administered 2016-03-12 (×2): 60 mg via ORAL
  Filled 2016-03-11 (×2): qty 1

## 2016-03-11 MED ORDER — FUROSEMIDE 10 MG/ML IJ SOLN
40.0000 mg | Freq: Two times a day (BID) | INTRAMUSCULAR | Status: DC
  Administered 2016-03-11 (×2): 40 mg via INTRAVENOUS
  Filled 2016-03-11 (×2): qty 4

## 2016-03-11 MED ORDER — CARVEDILOL 3.125 MG PO TABS
6.2500 mg | ORAL_TABLET | Freq: Two times a day (BID) | ORAL | Status: DC
  Administered 2016-03-11: 6.25 mg via ORAL
  Filled 2016-03-11 (×4): qty 2

## 2016-03-11 MED ORDER — CETYLPYRIDINIUM CHLORIDE 0.05 % MT LIQD
7.0000 mL | Freq: Two times a day (BID) | OROMUCOSAL | Status: DC
Start: 2016-03-12 — End: 2016-03-12

## 2016-03-11 MED ORDER — HALOPERIDOL LACTATE 5 MG/ML IJ SOLN: 1.0000 mg | Freq: Four times a day (QID) | INTRAMUSCULAR | Status: DC | PRN

## 2016-03-11 MED ORDER — INSULIN ASPART 100 UNIT/ML ~~LOC~~ SOLN
0.0000 [IU] | SUBCUTANEOUS | Status: DC
  Administered 2016-03-12 (×2): 2 [IU] via SUBCUTANEOUS
  Administered 2016-03-12: 1 [IU] via SUBCUTANEOUS
  Administered 2016-03-12: 2 [IU] via SUBCUTANEOUS
  Administered 2016-03-12: 5 [IU] via SUBCUTANEOUS

## 2016-03-11 MED ORDER — IPRATROPIUM-ALBUTEROL 0.5-2.5 (3) MG/3ML IN SOLN
3.0000 mL | Freq: Four times a day (QID) | RESPIRATORY_TRACT | Status: DC
  Administered 2016-03-12 – 2016-03-15 (×13): 3 mL via RESPIRATORY_TRACT
  Filled 2016-03-11 (×13): qty 3

## 2016-03-11 MED ORDER — METHYLPREDNISOLONE SODIUM SUCC 125 MG IJ SOLR
60.0000 mg | Freq: Three times a day (TID) | INTRAMUSCULAR | Status: DC
  Administered 2016-03-11 – 2016-03-15 (×11): 60 mg via INTRAVENOUS
  Filled 2016-03-11 (×11): qty 2

## 2016-03-11 MED ORDER — FUROSEMIDE 10 MG/ML IJ SOLN
60.0000 mg | Freq: Three times a day (TID) | INTRAMUSCULAR | Status: DC
Start: 2016-03-11 — End: 2016-03-18
  Administered 2016-03-11 – 2016-03-17 (×19): 60 mg via INTRAVENOUS
  Filled 2016-03-11 (×18): qty 6

## 2016-03-11 NOTE — Progress Notes (Signed)
[facility] came to evaluate patient. Verbal order for Foley catheter. [facility]. AC notified and aware of situation. Oncoming RN made aware of situation. Lesly Dukesachel J Everett, RN

## 2016-03-11 NOTE — Progress Notes (Signed)
Called to see patient for dec'd MS and severe hypercarbia.  On Bipap and waking up now. ABG was 7.22/109/ 63.  Pt chronic retainer judging by admission chem panel w serum CO2 > 40.   On exam there are bilat rales at the bases, pt is awake and responds to simple questions.  She has chron resp failure on home O2, hx MI 1999, DM2, asthma, CAD w hx of stents, HTN and HL.  Had thoracentesis earlier today.    Will prob need transfer to hospital that has ICU/ stepdown beds. Will d/w CCM.    Vinson Moselleob Emrie Gayle MD [facility] pager [phone]    cell [phone] 03/11/2016, 7:46 PM

## 2016-03-11 NOTE — Clinical Social Work Note (Signed)
Clinical Social Work Assessment  Patient Details  Name: [name] MRN: [id number] Date of Birth: 11/12/1930  Date of referral:  03/11/16               Reason for consult:  Discharge Planning                Permission sought to share information with:  Family Supports Permission granted to share information::  Yes, Verbal Permission Granted  Name::     [name] and [name]  Agency::     Relationship::  daughters  Contact Information:     Housing/Transportation Living arrangements for the past 2 months:  Single Family Home Source of Information:  Adult Children Patient Interpreter Needed:  None Criminal Activity/Legal Involvement Pertinent to Current Situation/Hospitalization:  No - Comment as needed Significant Relationships:  Adult Children, Neighbor Lives with:  Self Do you feel safe going back to the place where you live?  Yes Need for family participation in patient care:  Yes (Comment)  Care giving concerns:  Pt lives alone and is deconditioned.    Social Worker assessment / plan:  CSW attempted to meet with pt, but pt sleeping. She did wake up enough to request that CSW complete assessment with [name] or [name], her daughters. Pt lives alone and [name] lives nearby and checks on pt often. She also has neighbors and church friends who check in. Pt was managing fine at home until about 2 weeks ago. [name] indicates since then she has become progressively weaker. PT evaluated pt and recommendation is for SNF. CSW discussed placement process and Medicare coverage/criteria. [facility] list provided.   Employment status:  Retired Health and safety inspectornsurance information:  Medicare PT Recommendations:  Skilled Nursing Facility Information / Referral to community resources:  Skilled Nursing Facility  Patient/Family's Response to care:  Family agree with plan for short term rehab at d/c.   Patient/Family's Understanding of and Emotional Response to Diagnosis,  Current Treatment, and Prognosis:  Pt's family is aware of admission diagnosis and treatment plan.   Emotional Assessment Appearance:  Appears stated age Attitude/Demeanor/Rapport:  Unable to Assess Affect (typically observed):  Unable to Assess Orientation:  Oriented to Self, Oriented to Place, Oriented to  Time, Oriented to Situation Alcohol / Substance use:  Not Applicable Psych involvement (Current and /or in the community):  No (Comment)  Discharge Needs  Concerns to be addressed:  Discharge Planning Concerns Readmission within the last 30 days:  No Current discharge risk:  Lives alone Barriers to Discharge:  Continued Medical Work up   Karn CassisStultz, Jayon Matton [place], LCSW 03/11/2016, 3:17 PM [phone]

## 2016-03-11 NOTE — Care Management Note (Signed)
Case Management Note  Patient Details  Name: Kathy CuriaVirginia L Page MRN: [id number] Date of Birth: 05/12/1931  Subjective/Objective: Pt is from home alone, with family support. Patient has a walker at home, uses it sporadically. Per daughter, patient does not have any problems obtaining meds or getting to doctors appointments.                    Action/Plan: Per PT note, patient has been recommended for SNF, CSW aware. Daughter expressed she is agreeable with this plan. Patient sleeping during assessment. No CM needs at this time, will cont. To follow.    Expected Discharge Date:  03/11/16               Expected Discharge Plan:  Skilled Nursing Facility  In-House Referral:  Clinical Social Work  Discharge planning Services  CM Consult  Post Acute Care Choice:  NA Choice offered to:  NA  DME Arranged:    DME Agency:     HH Arranged:    HH Agency:     Status of Service:  Completed, signed off  Medicare Important Message Given:  Yes Date Medicare IM Given:    Medicare IM give by:    Date Additional Medicare IM Given:    Additional Medicare Important Message give by:     If discussed at Long Length of Stay Meetings, dates discussed:    Additional Comments:  Elyza Whitt, [name], RN 03/11/2016, 1:36 PM

## 2016-03-11 NOTE — Progress Notes (Signed)
Subjective: She looks and feels a little bit better. She had 1 L removed with thoracentesis. Her echocardiogram shows systolic dysfunction and wall motion abnormality. She also has persistent atrial fibrillation. She has COPD at baseline and has chronic hypoxic respiratory failure on home oxygen  Objective: Vital signs in last 24 hours: Temp:  [97.7 F (36.5 C)-98 F (36.7 C)] 98 F (36.7 C) (05/23 0602) Pulse Rate:  [73-108] 108 (05/23 0602) Resp:  [20-26] 20 (05/23 0602) BP: (105-135)/(51-70) 125/66 mmHg (05/23 0602) SpO2:  [92 %-100 %] 95 % (05/23 0602) Weight change:  Last BM Date: 03/09/16  Intake/Output from previous day: 05/22 0701 - 05/23 0700 In: 723 [P.O.:720; I.V.:3] Out: 1100 [Urine:1100]  PHYSICAL EXAM General appearance: alert, cooperative, mild distress and She looks more comfortable than yesterday Resp: She has diminished breath sounds. Her respiratory rates about 20 Cardio: irregularly irregular rhythm GI: soft, non-tender; bowel sounds normal; no masses,  no organomegaly Extremities: extremities normal, atraumatic, no cyanosis or edema  Lab Results:  Results for orders placed or performed during the hospital encounter of 03/09/16 (from the past 48 hour(s))  Basic metabolic panel     Status: Abnormal   Collection Time: 03/09/16  4:40 PM  Result Value Ref Range   Sodium 142 135 - 145 mmol/L   Potassium 3.5 3.5 - 5.1 mmol/L   Chloride 87 (L) 101 - 111 mmol/L   CO2 46 (H) 22 - 32 mmol/L   Glucose, Bld 206 (H) 65 - 99 mg/dL   BUN 19 6 - 20 mg/dL   Creatinine, Ser 0.67 0.44 - 1.00 mg/dL   Calcium 9.0 8.9 - 10.3 mg/dL   GFR calc non Af Amer >60 >60 mL/min   GFR calc Af Amer >60 >60 mL/min    Comment: (NOTE) The eGFR has been calculated using the CKD EPI equation. This calculation has not been validated in all clinical situations. eGFR's persistently <60 mL/min signify possible Chronic Kidney Disease.    Anion gap 9 5 - 15    Comment: Performed at [facility]  CBC     Status: Abnormal   Collection Time: 03/09/16  4:40 PM  Result Value Ref Range   WBC 7.4 4.0 - 10.5 K/uL   RBC 4.70 3.87 - 5.11 MIL/uL   Hemoglobin 14.1 12.0 - 15.0 g/dL   HCT 48.7 (H) 36.0 - 46.0 %   MCV 103.6 (H) 78.0 - 100.0 fL   MCH 30.0 26.0 - 34.0 pg   MCHC 29.0 (L) 30.0 - 36.0 g/dL   RDW 15.1 11.5 - 15.5 %   Platelets 172 150 - 400 K/uL  Troponin I     Status: None   Collection Time: 03/09/16  4:40 PM  Result Value Ref Range   Troponin I 0.03 <0.031 ng/mL    Comment:        NO INDICATION OF MYOCARDIAL INJURY. Performed at [facility]   Brain natriuretic peptide     Status: Abnormal   Collection Time: 03/09/16  4:40 PM  Result Value Ref Range   B Natriuretic Peptide 435.0 (H) 0.0 - 100.0 pg/mL  Glucose, capillary     Status: Abnormal   Collection Time: 03/09/16  8:19 PM  Result Value Ref Range   Glucose-Capillary 171 (H) 65 - 99 mg/dL  Basic metabolic panel     Status: Abnormal   Collection Time: 03/10/16  4:45 AM  Result Value Ref Range   Sodium 142 135 - 145 mmol/L  Potassium 3.5 3.5 - 5.1 mmol/L   Chloride 86 (L) 101 - 111 mmol/L   CO2 48 (H) 22 - 32 mmol/L   Glucose, Bld 135 (H) 65 - 99 mg/dL   BUN 19 6 - 20 mg/dL   Creatinine, Ser 0.54 0.44 - 1.00 mg/dL   Calcium 8.5 (L) 8.9 - 10.3 mg/dL   GFR calc non Af Amer >60 >60 mL/min   GFR calc Af Amer >60 >60 mL/min    Comment: (NOTE) The eGFR has been calculated using the CKD EPI equation. This calculation has not been validated in all clinical situations. eGFR's persistently <60 mL/min signify possible Chronic Kidney Disease.    Anion gap 8 5 - 15  CBC     Status: Abnormal   Collection Time: 03/10/16  4:45 AM  Result Value Ref Range   WBC 6.7 4.0 - 10.5 K/uL   RBC 4.20 3.87 - 5.11 MIL/uL   Hemoglobin 12.5 12.0 - 15.0 g/dL   HCT 43.9 36.0 - 46.0 %   MCV 104.5 (H) 78.0 - 100.0 fL   MCH 29.8 26.0 - 34.0 pg   MCHC 28.5 (L) 30.0 - 36.0 g/dL   RDW 15.2 11.5  - 15.5 %   Platelets 152 150 - 400 K/uL  Lactate dehydrogenase     Status: None   Collection Time: 03/10/16  4:45 AM  Result Value Ref Range   LDH 154 98 - 192 U/L  Brain natriuretic peptide     Status: Abnormal   Collection Time: 03/10/16  6:25 AM  Result Value Ref Range   B Natriuretic Peptide 393.0 (H) 0.0 - 100.0 pg/mL  Protime-INR     Status: None   Collection Time: 03/10/16  6:25 AM  Result Value Ref Range   Prothrombin Time 13.8 11.6 - 15.2 seconds   INR 1.04 0.00 - 1.49  Hepatic function panel     Status: Abnormal   Collection Time: 03/10/16  6:25 AM  Result Value Ref Range   Total Protein 5.8 (L) 6.5 - 8.1 g/dL   Albumin 3.4 (L) 3.5 - 5.0 g/dL   AST 16 15 - 41 U/L   ALT 16 14 - 54 U/L   Alkaline Phosphatase 42 38 - 126 U/L   Total Bilirubin 0.7 0.3 - 1.2 mg/dL   Bilirubin, Direct 0.1 0.1 - 0.5 mg/dL   Indirect Bilirubin 0.6 0.3 - 0.9 mg/dL  Glucose, capillary     Status: Abnormal   Collection Time: 03/10/16  7:28 AM  Result Value Ref Range   Glucose-Capillary 103 (H) 65 - 99 mg/dL  Glucose, capillary     Status: Abnormal   Collection Time: 03/10/16 11:25 AM  Result Value Ref Range   Glucose-Capillary 126 (H) 65 - 99 mg/dL  Lactate dehydrogenase (CSF, pleural or peritoneal fluid)     Status: Abnormal   Collection Time: 03/10/16  1:00 PM  Result Value Ref Range   LD, Fluid 63 (H) 3 - 23 U/L    Comment: (NOTE) Results should be evaluated in conjunction with serum values    Fluid Type-FLDH FLUID     Comment: LEFT PLEURAL COLLECTED BY DOCTOR BOLES CORRECTED ON 05/22 AT 1413: PREVIOUSLY REPORTED AS Pleural, L   Protein, fluid - pleural or peritoneal     Status: None   Collection Time: 03/10/16  1:00 PM  Result Value Ref Range   Total protein, fluid <3.0 g/dL    Comment: (NOTE) No normal range established for this test Results should  be evaluated in conjunction with serum values    Fluid Type-FTP FLUID     Comment: LEFT PLEURAL COLLECTED BY  DOCTOR BOLES CORRECTED ON 05/22 AT 1414: PREVIOUSLY REPORTED AS Pleural, L   Glucose, pleural or peritoneal fluid     Status: None   Collection Time: 03/10/16  1:00 PM  Result Value Ref Range   Glucose, Fluid 151 mg/dL    Comment: (NOTE) No normal range established for this test Results should be evaluated in conjunction with serum values    Fluid Type-FGLU FLUID     Comment: LEFT PLEURAL COLLECTED BY DOCTOR BOLES CORRECTED ON 05/22 AT 1415: PREVIOUSLY REPORTED AS Pleural, L   Body fluid cell count with differential     Status: Abnormal   Collection Time: 03/10/16  1:00 PM  Result Value Ref Range   Fluid Type-FCT FLUID     Comment: LEFT PLEURAL COLLECTED BY DOCTOR BOLES CORRECTED ON 05/22 AT 1413: PREVIOUSLY REPORTED AS PLEURAL    Color, Fluid YELLOW YELLOW   Appearance, Fluid HAZY (A) CLEAR   WBC, Fluid 362 0 - 1000 cu mm   Neutrophil Count, Fluid 3 0 - 25 %   Lymphs, Fluid 84 %   Monocyte-Macrophage-Serous Fluid 13 (L) 50 - 90 %   Eos, Fluid 0 %   Other Cells, Fluid MANY %    Comment: OTHER CELLS IDENTIFIED AS MESOTHELIAL CELLS  Gram stain     Status: None   Collection Time: 03/10/16  1:00 PM  Result Value Ref Range   Specimen Description FLUID LEFT PLEURAL COLLECTED BY DOCTOR BOLES    Special Requests NONE    Gram Stain      CYTOSPIN SMEAR WBC PRESENT, PREDOMINANTLY MONONUCLEAR NO ORGANISMS SEEN Performed at [facility]    Report Status 03/10/2016 FINAL   Glucose, capillary     Status: Abnormal   Collection Time: 03/10/16  5:08 PM  Result Value Ref Range   Glucose-Capillary 143 (H) 65 - 99 mg/dL  Glucose, capillary     Status: Abnormal   Collection Time: 03/10/16  8:10 PM  Result Value Ref Range   Glucose-Capillary 182 (H) 65 - 99 mg/dL   Comment 1 Notify RN    Comment 2 Document in Chart   CBC     Status: Abnormal   Collection Time: 03/11/16  6:23 AM  Result Value Ref Range   WBC 7.2 4.0 - 10.5 K/uL   RBC 4.20 3.87 - 5.11 MIL/uL   Hemoglobin  12.4 12.0 - 15.0 g/dL   HCT 44.4 36.0 - 46.0 %   MCV 105.7 (H) 78.0 - 100.0 fL   MCH 29.5 26.0 - 34.0 pg   MCHC 27.9 (L) 30.0 - 36.0 g/dL   RDW 14.8 11.5 - 15.5 %   Platelets 145 (L) 150 - 400 K/uL  Basic metabolic panel     Status: Abnormal   Collection Time: 03/11/16  6:23 AM  Result Value Ref Range   Sodium 141 135 - 145 mmol/L   Potassium 3.8 3.5 - 5.1 mmol/L   Chloride 83 (L) 101 - 111 mmol/L   CO2 >50 (H) 22 - 32 mmol/L   Glucose, Bld 127 (H) 65 - 99 mg/dL   BUN 16 6 - 20 mg/dL   Creatinine, Ser 0.72 0.44 - 1.00 mg/dL   Calcium 8.6 (L) 8.9 - 10.3 mg/dL   GFR calc non Af Amer >60 >60 mL/min   GFR calc Af Amer >60 >60 mL/min  Comment: (NOTE) The eGFR has been calculated using the CKD EPI equation. This calculation has not been validated in all clinical situations. eGFR's persistently <60 mL/min signify possible Chronic Kidney Disease.   Glucose, capillary     Status: Abnormal   Collection Time: 03/11/16  7:26 AM  Result Value Ref Range   Glucose-Capillary 122 (H) 65 - 99 mg/dL   Comment 1 Notify RN    Comment 2 Document in Chart     ABGS No results for input(s): PHART, PO2ART, TCO2, HCO3 in the last 72 hours.  Invalid input(s): PCO2 CULTURES Recent Results (from the past 240 hour(s))  Gram stain     Status: None   Collection Time: 03/10/16  1:00 PM  Result Value Ref Range Status   Specimen Description FLUID LEFT PLEURAL COLLECTED BY DOCTOR BOLES  Final   Special Requests NONE  Final   Gram Stain   Final    CYTOSPIN SMEAR WBC PRESENT, PREDOMINANTLY MONONUCLEAR NO ORGANISMS SEEN Performed at [facility]    Report Status 03/10/2016 FINAL  Final   Studies/Results: Dg Chest 1 View  03/10/2016  CLINICAL DATA:  LEFT pleural effusion post thoracentesis EXAM: CHEST 1 VIEW COMPARISON:  03/09/2016 FINDINGS: Expiratory technique. Significant decrease in LEFT pleural effusion post thoracentesis. No pneumothorax. Persistent small to moderate LEFT pleural effusion  and basilar atelectasis identified. Small RIGHT pleural effusion also noted. Patient rotated to the LEFT. Dense atherosclerotic calcification and aorta. Question enlargement of cardiac silhouette. Diffuse osseous demineralization. IMPRESSION: Significant decrease in LEFT pleural effusion post thoracentesis. No pneumothorax. Electronically Signed   By: Lavonia Dana M.D.   On: 03/10/2016 13:57   Dg Chest 2 View  03/09/2016  CLINICAL DATA:  Shortness of breath. Weakness. Atrial fibrillation. EXAM: CHEST  2 VIEW COMPARISON:  02/11/2016 FINDINGS: The large left and moderate right pleural effusion. Only about 25% of the left lung is aerated. Left heart border obscured.  Suspected underlying cardiomegaly. Atherosclerotic aortic arch. Thoracic spondylosis. Indistinct pulmonary vasculature. Bony demineralization. IMPRESSION: 1. Large left and moderate right pleural effusions. Only about 25% of the left lung is aerated. 2. Atherosclerosis. 3. Thoracic spondylosis. 4. Indistinct pulmonary vasculature could reflect pulmonary venous hypertension. Electronically Signed   By: Van Clines M.D.   On: 03/09/2016 17:29   US Thoracentesis Asp Pleural Space W/img Guide  03/10/2016  INDICATION: LEFT pleural effusion EXAM: ULTRASOUND GUIDED DIAGNOSTIC AND THERAPEUTIC LEFT THORACENTESIS MEDICATIONS: None. COMPLICATIONS: None immediate. PROCEDURE: Procedure, benefits, and risks of procedure were discussed with patient. Written informed consent for procedure was obtained. Time out protocol followed. Pleural effusion localized by ultrasound at the posterior LEFT hemithorax. Skin prepped and draped in usual sterile fashion. Skin and soft tissues anesthetized with 10 mL of 1% lidocaine. 8 French thoracentesis catheter placed into the 1000 mL pleural space. 1000 mL of amber color fluid was aspirated by syringe pump. Procedure tolerated well by patient without immediate complication. FINDINGS: A total of approximately 1000 mL of LEFT  pleural fluid was removed. 180 mL of fluid was sent to the laboratory as requested by the clinical team for analysis. IMPRESSION: Successful ultrasound guided LEFT thoracentesis yielding 1000 mL of pleural fluid. Electronically Signed   By: Lavonia Dana M.D.   On: 03/10/2016 14:08    Medications:  Prior to Admission:  Prescriptions prior to admission  Medication Sig Dispense Refill Last Dose  . acetaminophen (TYLENOL) 500 MG tablet Take 1,000 mg by mouth every 6 (six) hours as needed for mild pain.  03/08/2016  . aspirin EC 81 MG tablet Take 81 mg by mouth daily.   03/09/2016  . diltiazem (CARDIZEM CD) 180 MG 24 hr capsule Take 1 capsule (180 mg total) by mouth daily. 90 capsule 3 03/09/2016  . furosemide (LASIX) 40 MG tablet Take 40 mg by mouth daily.     03/09/2016  . ipratropium-albuterol (DUONEB) 0.5-2.5 (3) MG/3ML SOLN Take 3 mLs by nebulization as needed.     Past Month  . LORazepam (ATIVAN) 1 MG tablet Take 0.5 mg by mouth daily.    03/09/2016  . omeprazole (PRILOSEC) 20 MG capsule Take 20 mg by mouth at bedtime.    03/08/2016  . atorvastatin (LIPITOR) 10 MG tablet Take 10 mg by mouth daily. Reported on 12/04/2015   Taking  . nitroGLYCERIN (NITROSTAT) 0.4 MG SL tablet Place 1 tablet (0.4 mg total) under the tongue every 5 (five) minutes as needed for chest pain. 25 tablet 3 Never  . NON FORMULARY Pt on 2.5 liters of O2 daily   Taking   Scheduled: . antiseptic oral rinse  7 mL Mouth Rinse BID  . aspirin EC  81 mg Oral Daily  . diltiazem  240 mg Oral Daily  . furosemide  40 mg Oral BID  . heparin subcutaneous  5,000 Units Subcutaneous Q8H  . LORazepam  0.5 mg Oral Daily  . pantoprazole  40 mg Oral Daily  . sodium chloride flush  3 mL Intravenous Q12H   Continuous:  ZOX:[id number] **OR** [DISCONTINUED] acetaminophen, ipratropium-albuterol, nitroGLYCERIN, [DISCONTINUED] ondansetron **OR** ondansetron (ZOFRAN) IV, polyethylene glycol  Assesment: She has acute on chronic hypoxic  respiratory failure which appears to be multifactorial and includes COPD with chronic hypoxic respiratory failure from that. She has a left pleural effusion and had 1 L removed. She has now permanent atrial fibrillation. She has heart failure by echocardiogram Principal Problem:   Acute on chronic respiratory failure with hypoxia (HCC) Active Problems:   Type 2 diabetes mellitus (HCC)   COPD (chronic obstructive pulmonary disease) (HCC)   Pleural effusion on left   Atrial fibrillation ([place])    Plan: Continue treatments. It appears that right now the larger issue is her heart failure but it is of course complicated by her chronic pulmonary disease    LOS: 2 days   Oris Staffieri L 03/11/2016, 8:55 AM

## 2016-03-11 NOTE — Progress Notes (Signed)
Pt's family had concerns regarding patient mental status change. RN will full neuro assessment. VSS. MD notified and aware. Verbals orders for CT HEAD and ABG orders. RN will continue to monitor. Lesly Dukesachel J Everett, RN

## 2016-03-11 NOTE — Progress Notes (Signed)
Primary cardiologist: Dr. Jonelle Sidle  Subjective:  "Maybe breathing a little better." Weak overall. No palpitations.  Objective:  Vital Signs in the last 24 hours: Temp:  [97.7 F (36.5 C)-98 F (36.7 C)] 98 F (36.7 C) (05/23 0602) Pulse Rate:  [73-108] 108 (05/23 0602) Resp:  [20-26] 20 (05/23 0602) BP: (105-135)/(51-70) 125/66 mmHg (05/23 0602) SpO2:  [92 %-100 %] 95 % (05/23 0602)  Intake/Output from previous day: 05/22 0701 - 05/23 0700 In: 723 [P.O.:720; I.V.:3] Out: 1100 [Urine:1100]  Physical Exam:  Still with labored breathing NECK:Increased JVD, HJR LUNGS:Decreased breath sounds with basilar rales HEART: Irregular rate and rhythm, 1-2/6 systolic murmur LSB,no gallop, rub, bruit, thrill, or heave EXTREMITIES: Without cyanosis, clubbing, or edema  Lab Results:  Recent Labs  03/10/16 0445 03/11/16 0623  WBC 6.7 7.2  HGB 12.5 12.4  PLT 152 145*    Recent Labs  03/10/16 0445 03/11/16 0623  NA 142 141  K 3.5 3.8  CL 86* 83*  CO2 48* >50*  GLUCOSE 135* 127*  BUN 19 16  CREATININE 0.54 0.72    Recent Labs  03/09/16 1640  TROPONINI 0.03   Hepatic Function Panel  Recent Labs  03/10/16 0625  PROT 5.8*  ALBUMIN 3.4*  AST 16  ALT 16  ALKPHOS 42  BILITOT 0.7  BILIDIR 0.1  IBILI 0.6   Cardiac Studies: 2Decho 03/10/16 Study Conclusions   - Left ventricle: The cavity size was normal. Wall thickness was   increased in a pattern of mild LVH. Systolic function was mildly   to moderately reduced. The estimated ejection fraction was in the   range of 40% to 45%. Diffuse hypokinesis. There is akinesis of   the basalinferior myocardium. The study is not technically   sufficient to allow evaluation of LV diastolic function. - Ventricular septum: Septal motion showed abnormal function and   dyssynergy. The contour showed diastolic flattening. - Aortic valve: Trileaflet; mildly calcified leaflets. Left   coronary cusp mobility was  restricted. There was trivial   regurgitation. - Mitral valve: Calcified annulus. There was mild to moderate   regurgitation. - Left atrium: The atrium was moderately dilated. - Right ventricle: The cavity size was severely dilated. Systolic   function was severely reduced. - Right atrium: The atrium was moderately dilated. Central venous   pressure (est): 8 mm Hg. - Tricuspid valve: There was mild regurgitation. - Pulmonary arteries: PA peak pressure: 30 mm Hg (S). - Pericardium, extracardiac: A small pericardial effusion was   identified circumferential to the heart. There was a left pleural   effusion.   Impressions:   - Mild LVH with LVEF 40-45%, diffuse hypokinesis with akinesis of   the basal inferior wall. Indeterminate diastolic function in the   setting of atrial fibrillation. Septal dyssynergy suggesting IVCD   and diastolic septal flattening also noted. Moderate left atrial   enlargement. MAC with mild to moderate mitral regurgitation.   Sclerotic aortic valve with trivial aortic regurgitation.   Severely dilated right ventricle with severely reduced   contraction. Moderate right atrial enlargement. Mild tricuspid   regurgitation with PASP 30 mmHg. Left pleural effusion noted as   well as small pericardial effusion.  CXR 03/10/16: IMPRESSION: Significant decrease in LEFT pleural effusion post thoracentesis.   No pneumothorax.   Assessment/Plan:   1. Persistent atrial fibrillation with RVR at 115/m today and CHADSVASC score of 5 Was on cardizem CD  at home now on 240 mg.Will  add  low dose Coreg 6.25 mg BID for better rate control. Consider Eliquis eventually.  2. Increasing shortness of breath with bilateral pleural effusions, left greater than right. S/P left thoracentesis yesterday with removal of 1000cc and improvement in CXR, but still with labored breathing. Was on Lasix 40 mg once daily at home, will give IV today to see if this helps.  3. CAD with previous  RCA interventions in 1999 and 2001. No active angina symptoms, troponin I level negative.  4. Oxygen-dependent COPD.  5. Combined systolic and diastolic CHF EF 16-10%40-45% with severely dilated RV. Change Lasix to IV today.  6. Deconditioning. Needs PT evaluation.   LOS: 2 days   Jacolyn ReedyMichele Lenze PA-C  03/11/2016, 9:29 AM   Attending note:  Patient seen and examined. Discussed with Ms. Lilla ShookLenze PA-C. She is status post left-sided thoracentesis yesterday with removal of 1000 mL pleural fluid. Still short of breath and has evidence of volume overload. Plan is to change to IV Lasix for further diuresis, also continue to work on heart rate control with the addition of Coreg. She has LV dysfunction with LVEF 40-45%, also severe RV dysfunction most likely contributed to by her chronic pulmonary disease. She is deconditioned and would benefit from PT evaluation, may need a rehabilitation stay.  Jonelle SidleSamuel G. McDowell, M.D., F.A.C.C.

## 2016-03-11 NOTE — Progress Notes (Signed)
eLink Physician-Brief Progress Note Patient Name: Kathy CuriaVirginia L Page DOB: 05/06/1931 MRN: [id number]   Date of Service  03/11/2016  HPI/Events of Note  COPD on home O2 CHF- EF 45% with dilated RV Left transudative effusion s/p thora 5/22 Tr to ICU for acute hypercarbic resp failure  eICU Interventions  WOB ok on bipap 40%, mental status improved Keep on bipap overnight & reassess in am -being diuresed     Intervention Category Evaluation Type: New Patient Evaluation  ALVA,RAKESH V. 03/11/2016, 11:31 PM

## 2016-03-11 NOTE — Progress Notes (Addendum)
PROGRESS NOTE                                                                                                                                                                                                             Patient Demographics:    [name], is a 82 y.o. female, DOB - [birth year], ZOX:[id number]  Admit date - 03/09/2016   Admitting Physician Levie Heritage, DO  Outpatient Primary MD for the patient is [name], MD  LOS - 2  Chief Complaint  Patient presents with  . Shortness of Breath       Brief Narrative     [name] is a 82 y.o. female with a history of atrial fibrillation with a chads score of 5 on aspirin and not on anticoagulation secondary to history of GI bleed, coronary artery disease with stenting of the RCA and a history of inferior wall MI in 1999, hypertension, type 2 diabetes, hyperlipidemia.She was recently diagnosed with atrial fibrillation in the outpatient setting and was being worked up by cardiology.  He presented to the hospital with weakness and fatigue, found to be in CHF with bilateral pleural effusion, cardiology and pulmonary were consulted, she underwent 1 L thoracentesis and left lung fluid removal on 03/10/2016 which appears to be transudate due to CHF. However her echogram shows a depressed EF with wall motion of normality. Likely will need a stress test along with further workup by cardiology.       Subjective:    [place] today has, No headache, No chest pain, No abdominal pain - No Nausea, No new weakness tingling or numbness, No Cough - mild SOB.   Assessment  & Plan :     1. Acute on chronic hypoxic respiratory failure due to bilateral pleural effusions left more than right. All secondary to acute on chronic combined systolic and diastolic CHF EF 81%.  She is status post left-sided thoracentesis with 1 L transudative fluid removal on 03/10/2016,  minimal effusion on the right side and left side remains, continue diuresis with Lasix, both cardiology and pulmonary following, will continue to monitor BMP, continue to provide supportive care. Advance activity. Clinically she is better.  Note her echogram shows various wall motion of normality and most likely she will require further ischemic testing by cardiology.  2. New diagnosis of  persistent atrial fibrillation. [place] vasc 2 score of close to 5. Currently on Cardizem and aspirin, she was following with cardiology outpatient and anticoagulation was being considered, have requested cardiology following, she might be switched from Cardizem to beta blocker and long-term anticoagulation with Eliquis could be considered, however will defer changes to cardiology.  3. New diagnosis of combined systolic and diastolic CHF EF 16% and with wall motion of normality's. No previous echogram, treatment as in #1 above, cardiology following.  4. Diet-controlled type 2 diabetes mellitus. Check A1c, ISS.  CBG (last 3)   Recent Labs  03/10/16 2010 03/11/16 0726 03/11/16 1123  GLUCAP 182* 122* 177*    5. ? HX of COPD. On home oxygen, currently no wheezing. Supportive care. Has never seen a pulmonologist before. Pulmonary following here appreciate their input.   Addendum. Was called by the nursing staff at 6 PM. Patient is very minimally confused, neuro exam by the nurse unremarkable without any focal weakness, most likely patient has mild delirium, minimize benzodiazepines and narcotics, Haldol when necessary. Since she has COPD. Will check ABG , check head CT.  6.30pm - ABG with ++ Resp Acidosis - BIPAP-ICU-Nebs-CXR-Steroids-NPO- called and asked [facility] staff to evaluate, I am at [facility] finishing admission.    Code Status :  Full  Family Communication  :  Daughter  Disposition Plan  :  Home 2-3 days  Consults  :  Cards, Pulm, Radiology  Procedures  :    TTE - with LVH, EF 45% and diffuse  wall motion abnormalities  Left-sided thoracentesis with 1 L transudative fluid removed on 03/10/2016  DVT Prophylaxis  :  Heparin    Lab Results  Component Value Date   PLT 145* 03/11/2016    Inpatient Medications  Scheduled Meds: . antiseptic oral rinse  7 mL Mouth Rinse BID  . aspirin EC  81 mg Oral Daily  . diltiazem  240 mg Oral Daily  . furosemide  40 mg Oral BID  . heparin subcutaneous  5,000 Units Subcutaneous Q8H  . LORazepam  0.5 mg Oral Daily  . pantoprazole  40 mg Oral Daily  . sodium chloride flush  3 mL Intravenous Q12H   Continuous Infusions:  PRN Meds:.acetaminophen **OR** [DISCONTINUED] acetaminophen, ipratropium-albuterol, nitroGLYCERIN, [DISCONTINUED] ondansetron **OR** ondansetron (ZOFRAN) IV, polyethylene glycol  Antibiotics  :    Anti-infectives    None         Objective:   Filed Vitals:   03/10/16 1339 03/10/16 1456 03/10/16 2121 03/11/16 0602  BP: 117/70 105/55 111/51 125/66  Pulse: 100 89 73 108  Temp:  97.7 F (36.5 C) 97.8 F (36.6 C) 98 F (36.7 C)  TempSrc:  Oral Oral Oral  Resp: 24 20 20 20   Height:      Weight:      SpO2: 96% 100% 98% 95%    Wt Readings from Last 3 Encounters:  03/10/16 75.4 kg (166 lb 3.6 oz)  02/27/16 75.751 kg (167 lb)  12/04/15 74.299 kg (163 lb 12.8 oz)     Intake/Output Summary (Last 24 hours) at 03/11/16 0720 Last data filed at 03/11/16 0112  Gross per 24 hour  Intake    723 ml  Output   1100 ml  Net   -377 ml     Physical Exam  Awake Alert, Oriented X 3, No new F.N deficits, Normal affect [place].AT,PERRAL Supple Neck,No JVD, No cervical lymphadenopathy appriciated.  Symmetrical Chest wall movement, Mod air movement bilaterally,  Reduced breath sounds  in both bases, rales at the bases RRR,No Gallops,Rubs or new Murmurs, No Parasternal Heave +ve B.Sounds, Abd Soft, No tenderness, No organomegaly appriciated, No rebound - guarding or rigidity. No Cyanosis, Clubbing or edema, No new Rash or  bruise     Data Review:    CBC  Recent Labs Lab 03/09/16 1640 03/10/16 0445 03/11/16 0623  WBC 7.4 6.7 7.2  HGB 14.1 12.5 12.4  HCT 48.7* 43.9 44.4  PLT 172 152 145*  MCV 103.6* 104.5* 105.7*  MCH 30.0 29.8 29.5  MCHC 29.0* 28.5* 27.9*  RDW 15.1 15.2 14.8    Chemistries   Recent Labs Lab 03/09/16 1640 03/10/16 0445 03/10/16 0625 03/11/16 0623  NA 142 142  --  141  K 3.5 3.5  --  3.8  CL 87* 86*  --  83*  CO2 46* 48*  --  >50*  GLUCOSE 206* 135*  --  127*  BUN 19 19  --  16  CREATININE 0.67 0.54  --  0.72  CALCIUM 9.0 8.5*  --  8.6*  AST  --   --  16  --   ALT  --   --  16  --   ALKPHOS  --   --  42  --   BILITOT  --   --  0.7  --    ------------------------------------------------------------------------------------------------------------------ No results for input(s): CHOL, HDL, LDLCALC, TRIG, CHOLHDL, LDLDIRECT in the last 72 hours.  No results found for: HGBA1C ------------------------------------------------------------------------------------------------------------------ No results for input(s): TSH, T4TOTAL, T3FREE, THYROIDAB in the last 72 hours.  Invalid input(s): FREET3 ------------------------------------------------------------------------------------------------------------------ No results for input(s): VITAMINB12, FOLATE, FERRITIN, TIBC, IRON, RETICCTPCT in the last 72 hours.  Coagulation profile  Recent Labs Lab 03/10/16 0625  INR 1.04    No results for input(s): DDIMER in the last 72 hours.  Cardiac Enzymes  Recent Labs Lab 03/09/16 1640  TROPONINI 0.03   ------------------------------------------------------------------------------------------------------------------    Component Value Date/Time   BNP 393.0* 03/10/2016 [id number]    Micro Results Recent Results (from the past 240 hour(s))  Gram stain     Status: None   Collection Time: 03/10/16  1:00 PM  Result Value Ref Range Status   Specimen Description FLUID LEFT  PLEURAL COLLECTED BY DOCTOR BOLES  Final   Special Requests NONE  Final   Gram Stain   Final    CYTOSPIN SMEAR WBC PRESENT, PREDOMINANTLY MONONUCLEAR NO ORGANISMS SEEN Performed at [facility]    Report Status 03/10/2016 FINAL  Final    Radiology Reports Dg Chest 1 View  03/10/2016  CLINICAL DATA:  LEFT pleural effusion post thoracentesis EXAM: CHEST 1 VIEW COMPARISON:  03/09/2016 FINDINGS: Expiratory technique. Significant decrease in LEFT pleural effusion post thoracentesis. No pneumothorax. Persistent small to moderate LEFT pleural effusion and basilar atelectasis identified. Small RIGHT pleural effusion also noted. Patient rotated to the LEFT. Dense atherosclerotic calcification and aorta. Question enlargement of cardiac silhouette. Diffuse osseous demineralization. IMPRESSION: Significant decrease in LEFT pleural effusion post thoracentesis. No pneumothorax. Electronically Signed   By: Ulyses SouthwardMark  Boles M.D.   On: 03/10/2016 13:57   Dg Chest 2 View  03/09/2016  CLINICAL DATA:  Shortness of breath. Weakness. Atrial fibrillation. EXAM: CHEST  2 VIEW COMPARISON:  02/11/2016 FINDINGS: The large left and moderate right pleural effusion. Only about 25% of the left lung is aerated. Left heart border obscured.  Suspected underlying cardiomegaly. Atherosclerotic aortic arch. Thoracic spondylosis. Indistinct pulmonary vasculature. Bony demineralization. IMPRESSION: 1. Large left and moderate right pleural effusions.  Only about 25% of the left lung is aerated. 2. Atherosclerosis. 3. Thoracic spondylosis. 4. Indistinct pulmonary vasculature could reflect pulmonary venous hypertension. Electronically Signed   By: Gaylyn Rong M.D.   On: 03/09/2016 17:29   Dg Chest 2 View  02/11/2016  CLINICAL DATA:  Shortness of breath for 2 weeks EXAM: CHEST  2 VIEW COMPARISON:  01/14/2010 FINDINGS: Cardiac shadow is enlarged. The lungs are well aerated bilaterally. No focal infiltrate or sizable effusion is seen.  Degenerative changes of the thoracic spine are noted. Some linear scarring is noted in the midportion of the lungs bilaterally. IMPRESSION: No acute abnormality noted. Electronically Signed   By: Alcide Clever M.D.   On: 02/11/2016 13:41   US Thoracentesis Asp Pleural Space W/img Guide  03/10/2016  INDICATION: LEFT pleural effusion EXAM: ULTRASOUND GUIDED DIAGNOSTIC AND THERAPEUTIC LEFT THORACENTESIS MEDICATIONS: None. COMPLICATIONS: None immediate. PROCEDURE: Procedure, benefits, and risks of procedure were discussed with patient. Written informed consent for procedure was obtained. Time out protocol followed. Pleural effusion localized by ultrasound at the posterior LEFT hemithorax. Skin prepped and draped in usual sterile fashion. Skin and soft tissues anesthetized with 10 mL of 1% lidocaine. 8 French thoracentesis catheter placed into the 1000 mL pleural space. 1000 mL of amber color fluid was aspirated by syringe pump. Procedure tolerated well by patient without immediate complication. FINDINGS: A total of approximately 1000 mL of LEFT pleural fluid was removed. 180 mL of fluid was sent to the laboratory as requested by the clinical team for analysis. IMPRESSION: Successful ultrasound guided LEFT thoracentesis yielding 1000 mL of pleural fluid. Electronically Signed   By: Ulyses Southward M.D.   On: 03/10/2016 14:08    Time Spent in minutes  30   SINGH,PRASHANT K M.D on 03/11/2016 at 7:20 AM  Between 7am to 7pm - Pager - [phone]  After 7pm go to [email or web address] - password [facility]  [facility] -  Office  [phone]

## 2016-03-11 NOTE — Progress Notes (Signed)
ICU bed has opened up here, will transfer to ICU here.  BP's soft, change long-acting dilt to short-acting qid.  CXR tonight looks like worsening CHF.  ^lasix 60 q 8.   Vinson Moselleob Molley Houser MD [facility] pager [phone]    cell [phone] 03/11/2016, 9:11 PM

## 2016-03-11 NOTE — Clinical Social Work Placement (Signed)
   CLINICAL SOCIAL WORK PLACEMENT  NOTE  Date:  03/11/2016  Patient Details  Name: [name] MRN: [id number] Date of Birth: 01/26/1931  Clinical Social Work is seeking post-discharge placement for this patient at the Skilled  Nursing Facility level of care (*CSW will initial, date and re-position this form in  chart as items are completed):  Yes   Patient/family provided with [facility] Clinical Social Work Department's list of facilities offering this level of care within the geographic area requested by the patient (or if unable, by the patient's family).  Yes   Patient/family informed of their freedom to choose among providers that offer the needed level of care, that participate in Medicare, Medicaid or managed care program needed by the patient, have an available bed and are willing to accept the patient.  Yes   Patient/family informed of [place]'s ownership interest in [facility] and [facility], as well as of the fact that they are under no obligation to receive care at these facilities.  PASRR submitted to EDS on 03/11/16     PASRR number received on 03/11/16     Existing PASRR number confirmed on       FL2 transmitted to all facilities in geographic area requested by pt/family on 03/11/16     FL2 transmitted to all facilities within larger geographic area on       Patient informed that his/her managed care company has contracts with or will negotiate with certain facilities, including the following:            Patient/family informed of bed offers received.  Patient chooses bed at       Physician recommends and patient chooses bed at      Patient to be transferred to   on  .  Patient to be transferred to facility by       Patient family notified on   of transfer.  Name of family member notified:        PHYSICIAN       Additional Comment:    _______________________________________________ Karn CassisStultz, Chrystopher Stangl [place], LCSW 03/11/2016, 3:09  PM [phone]

## 2016-03-11 NOTE — Progress Notes (Signed)
Per ABG results, MD gave verbal orders for BIPAP 12/5 STAT, CXY STAT, transfer pt to stepdown, NPO except meds, HOB at 60 degrees. Ordered to paged on call MD, [place] to have pt evaluated and have repeat ABG in 1 hr. Pt is currently on BIPAP. No beds available in ICU and MD paged. No return call. RN will continue to monitor. Lesly Dukesachel J Everett, RN

## 2016-03-11 NOTE — Progress Notes (Signed)
CRITICAL VALUE ALERT  Critical value received:  Ph 7.222, PCO2: >107, P02: 63.2 Stat 88.5  Date of notification:  03/11/16  Time of notification:  1838  Critical value read back:Yes.    Nurse who received alert: Myrene Buddyachel Everett   MD notified (1st page):  MD Thedore MinsSingh   Time of first page:  1842  MD notified (2nd page):  Time of second page:  Responding MD:  Thedore MinsSingh MD  Time MD responded:  [phone]

## 2016-03-11 NOTE — Consult Note (Signed)
   [facility]   03/11/2016  Simone CuriaVirginia L Tenpas 01/20/1931 [id number]  Spoke with patient and patient daughter, Allena EaringLouanne at bedside regarding [facility]. Patient does not want to sign up today to participate with [facility] program. [facility] and contact information for future reference.  Of note, [facility] Care [facility] services would not replace or interfere with any services that are arranged by inpatient case management or social work. For additional questions or referrals please contact:  Alben SpittleMary E. Albertha GheeNiemczura, RN, BSN, [facility]  [facility] Hospital Liaison [phone]

## 2016-03-11 NOTE — [place] FL2 (Signed)
[place] MEDICAID FL2 LEVEL OF CARE SCREENING TOOL     IDENTIFICATION  Patient Name: [name] Birthdate: 07/11/1931 Sex: female Admission Date (Current Location): 03/09/2016  [facility] [place]   Facility and Address:  [facility],  [address], [place] [id number]      Provider Number: [id number]  Attending Physician Name and Address:  Leroy SeaPrashant K Singh, MD  Relative Name and Phone Number:       Current Level of Care: Hospital Recommended Level of Care: Skilled Nursing Facility Prior Approval Number:    Date Approved/Denied:   PASRR Number: [id number]  Discharge Plan: SNF    Current Diagnoses: Patient Active Problem List   Diagnosis Date Noted  . Status post thoracentesis   . Pleural effusion, bilateral   . Atrial fibrillation with RVR (HCC)   . Acute on chronic respiratory failure with hypoxia (HCC) 03/09/2016  . Pleural effusion on left 03/09/2016  . Atrial fibrillation (HCC) 03/09/2016  . Hyperlipidemia   . Type 2 diabetes mellitus (HCC) 12/26/2009  . HYPERLIPIDEMIA 12/26/2009  . Essential hypertension, benign 12/26/2009  . Coronary atherosclerosis of native coronary artery 12/26/2009  . COPD (chronic obstructive pulmonary disease) (HCC) 12/26/2009    Orientation RESPIRATION BLADDER Height & Weight     Self, Time, Situation, Place  O2 (4L) Continent Weight: 166 lb 3.6 oz (75.4 kg) Height:  5\' 4"  (162.6 cm)  BEHAVIORAL SYMPTOMS/MOOD NEUROLOGICAL BOWEL NUTRITION STATUS  Other (Comment) (n/a)  (n/a) Continent Diet (heart healthy/carb modified)  AMBULATORY STATUS COMMUNICATION OF NEEDS Skin   Limited Assist Verbally Normal                       Personal Care Assistance Level of Assistance  Bathing, Feeding, Dressing Bathing Assistance: Limited assistance Feeding assistance: Limited assistance Dressing Assistance: Limited assistance     Functional Limitations Info  Sight, Hearing, Speech Sight Info:  Adequate Hearing Info: Adequate Speech Info: Adequate    SPECIAL CARE FACTORS FREQUENCY  PT (By licensed PT)     PT Frequency: 5              Contractures Contractures Info: Not present    Additional Factors Info  Insulin Sliding Scale Code Status Info: Full code Allergies Info: Codeine, Heparin           Current Medications (03/11/2016):  This is the current hospital active medication list Current Facility-Administered Medications  Medication Dose Route Frequency Provider Last Rate Last Dose  . acetaminophen (TYLENOL) tablet 650 mg  650 mg Oral Q6H PRN Rhona RaiderJacob J Stinson, DO      . antiseptic oral rinse (CPC / CETYLPYRIDINIUM CHLORIDE 0.05%) solution 7 mL  7 mL Mouth Rinse BID Rhona RaiderJacob J Stinson, DO   7 mL at 03/11/16 1000  . aspirin EC tablet 81 mg  81 mg Oral Daily Rhona RaiderJacob J Stinson, DO   81 mg at 03/11/16 [id number]  . carvedilol (COREG) tablet 6.25 mg  6.25 mg Oral BID WC Dyann KiefMichele M Lenze, PA-C      . diltiazem (CARDIZEM CD) 24 hr capsule 240 mg  240 mg Oral Daily Jonelle SidleSamuel G McDowell, MD   240 mg at 03/11/16 0954  . furosemide (LASIX) injection 40 mg  40 mg Intravenous BID Dyann KiefMichele M Lenze, PA-C   40 mg at 03/11/16 0954  . heparin injection 5,000 Units  5,000 Units Subcutaneous Q8H Leroy SeaPrashant K Singh, MD   5,000 Units at 03/11/16 0645  . insulin  aspart (novoLOG) injection 0-5 Units  0-5 Units Subcutaneous QHS Leroy Sea, MD      . insulin aspart (novoLOG) injection 0-9 Units  0-9 Units Subcutaneous TID WC Leroy Sea, MD   2 Units at 03/11/16 1157  . ipratropium-albuterol (DUONEB) 0.5-2.5 (3) MG/3ML nebulizer solution 3 mL  3 mL Nebulization PRN Levie Heritage, DO      . LORazepam (ATIVAN) tablet 0.5 mg  0.5 mg Oral Daily Rhona Raider Stinson, DO   0.5 mg at 03/11/16 1610  . nitroGLYCERIN (NITROSTAT) SL tablet 0.4 mg  0.4 mg Sublingual Q5 min PRN Rhona Raider Stinson, DO      . ondansetron [facility]) injection 4 mg  4 mg Intravenous Q6H PRN Rhona Raider Stinson, DO      . pantoprazole  (PROTONIX) EC tablet 40 mg  40 mg Oral Daily Rhona Raider Stinson, DO   40 mg at 03/11/16 0956  . polyethylene glycol (MIRALAX / GLYCOLAX) packet 17 g  17 g Oral Daily PRN Rhona Raider Stinson, DO      . sodium chloride flush (NS) 0.9 % injection 3 mL  3 mL Intravenous Q12H Rhona Raider Stinson, DO   3 mL at 03/11/16 1000     Discharge Medications: Please see discharge summary for a list of discharge medications.  Relevant Imaging Results:  Relevant Lab Results:   Additional Information SS#: [id number]  Karn Cassis, [place] [phone]

## 2016-03-11 NOTE — Evaluation (Signed)
Physical Therapy Evaluation Patient Details Name: [name] MRN: [id number] DOB: 12-08-1930 Today's Date: 03/11/2016   History of Present Illness  82 yo F admitted with worsening SOB over the last 3-4 weeks.  DX: CHF with B pleural effusions.  S/P thoracentesis 5/22 with removal of 1L.  Echo with depressed EF of 45%.  PMH: A-fib, not on anticoagulation due to GI bleed, CAD with RCA stenting, MI, HTN, DM2, asthma, COPD, cholecystectomy, exploratory laparotomy, partial colectomy.   Clinical Impression  Pt received sitting up in the chair, 2 daughters, and 2 friends present, and pt was agreeable to PT evaluation.  Pt states that she normally lives at home alone.  She is independent with ADL's, and uses home O2 - 2L.  Pt is normally a household ambulator, and does not use any AD, but admits to furniture walking.  Today during evaluation, pt required Mod A for sit<>stand, and  Min A for gait x 67ft with RW with consistent posterior lean.  Pt very fatigued at the end of the tx session.  Due to pt's need for increased assistance for all functional mobility, and she lives alone, she is recommended for SNF.      Follow Up Recommendations SNF    Equipment Recommendations  None recommended by PT    Recommendations for Other Services       Precautions / Restrictions Restrictions Weight Bearing Restrictions: No      Mobility  Bed Mobility Overal bed mobility:  (NA - pt already sitting up in the chair)                Transfers Overall transfer level: Needs assistance Equipment used: Rolling walker (2 wheeled) Transfers: Sit to/from Stand Sit to Stand: Mod assist         General transfer comment: vc's to scoot fwd and for anterior weight shift.  Once standing pt continues to demonstrate posterior lean with vc's and tc's for full hip extension, anterior weight shift, shoulders back, and head up.   Ambulation/Gait Ambulation/Gait assistance: Min assist Ambulation Distance  (Feet): 15 Feet Assistive device: Rolling walker (2 wheeled) Gait Pattern/deviations: Step-to pattern;Trunk flexed     General Gait Details: Pt with constant posterior lean, and required Min A to maintain balance during gait.  Gait distance limited due to fatigue.   Stairs            Wheelchair Mobility    Modified Rankin (Stroke Patients Only)       Balance Overall balance assessment: Needs assistance Sitting-balance support: Bilateral upper extremity supported Sitting balance-Leahy Scale: Fair     Standing balance support: Bilateral upper extremity supported Standing balance-Leahy Scale: Fair                               Pertinent Vitals/Pain Pain Assessment: No/denies pain    Home Living   Living Arrangements: Alone Available Help at Discharge: Friend(s) Type of Home: House Home Access: Ramped entrance     Home Layout: One level Home Equipment: Walker - 4 wheels Additional Comments: Home O2    Prior Function     Gait / Transfers Assistance Needed: Dtr [place] Axe (from [place]) states that she normally does not use anything to ambulate in the house, however she uses her rollator when out in the community.  Pt is mostly a household ambulator, and admits to furniture walking.  Dtr states that she became more home bound  when they switched her O2 tank over to the larger tankls.   ADL's / Homemaking Assistance Needed: Pt states that she is normally able to do her own dressing, and bathing, until last week where she had to call for assistance to get out of the bathtub.  Pt's dtr assists her with running errands and does the grocery shopping.         Hand Dominance        Extremity/Trunk Assessment   Upper Extremity Assessment: Generalized weakness           Lower Extremity Assessment: Generalized weakness         Communication   Communication: No difficulties  Cognition Arousal/Alertness: Awake/alert Behavior During Therapy: WFL  for tasks assessed/performed Overall Cognitive Status: Within Functional Limits for tasks assessed                      General Comments General comments (skin integrity, edema, etc.): Pt with c/o skin irritation against the back of the chair - RN notified, noted bandage over L scapula.      Exercises        Assessment/Plan    PT Assessment Patient needs continued PT services  PT Diagnosis Difficulty walking;Abnormality of gait;Generalized weakness   PT Problem List Decreased strength;Decreased range of motion;Decreased activity tolerance;Decreased balance;Decreased mobility;Decreased knowledge of use of DME;Decreased safety awareness;Decreased knowledge of precautions;Cardiopulmonary status limiting activity;Decreased skin integrity  PT Treatment Interventions DME instruction;Gait training;Stair training;Functional mobility training;Therapeutic activities;Therapeutic exercise;Balance training;Patient/family education   PT Goals (Current goals can be found in the Care Plan section) Acute Rehab PT Goals Patient Stated Goal: Pt wants to get her strength back PT Goal Formulation: With patient/family Time For Goal Achievement: 03/18/16 Potential to Achieve Goals: Good    Frequency Min 5X/week   Barriers to discharge Decreased caregiver support      Co-evaluation               End of Session Equipment Utilized During Treatment: Gait belt;Oxygen Activity Tolerance: Patient limited by fatigue Patient left: in chair;with call bell/phone within reach;with nursing/sitter in room;with family/visitor present Nurse Communication: Mobility status    Functional Assessment Tool Used: [employer] AM-PAC "6-clicks" Functional Limitation: Mobility: Walking and moving around Mobility: Walking and Moving Around Current Status [phone](G8978): At least 40 percent but less than 60 percent impaired, limited or restricted Mobility: Walking and Moving Around Goal Status [phone](G8979): At least  20 percent but less than 40 percent impaired, limited or restricted    Time: 0923-0955 PT Time Calculation (min) (ACUTE ONLY): 32 min   Charges:   PT Evaluation $PT Eval Moderate Complexity: 1 Procedure PT Treatments $Gait Training: 8-22 mins   PT G Codes:   PT G-Codes **NOT FOR INPATIENT CLASS** Functional Assessment Tool Used: [employer] AM-PAC "6-clicks" Functional Limitation: Mobility: Walking and moving around Mobility: Walking and Moving Around Current Status [phone](G8978): At least 40 percent but less than 60 percent impaired, limited or restricted Mobility: Walking and Moving Around Goal Status [phone](G8979): At least 20 percent but less than 40 percent impaired, limited or restricted    Beth Gini Caputo, PT, DPT X: [id number]   03/11/2016, 10:30 AM

## 2016-03-11 NOTE — Progress Notes (Signed)
Report called to Janice, RN in ICU. 

## 2016-03-12 ENCOUNTER — Ambulatory Visit: Payer: Medicare Other | Admitting: Cardiology

## 2016-03-12 ENCOUNTER — Inpatient Hospital Stay (HOSPITAL_COMMUNITY): Payer: Medicare Other

## 2016-03-12 DIAGNOSIS — R0600 Dyspnea, unspecified: Secondary | ICD-10-CM | POA: Insufficient documentation

## 2016-03-12 DIAGNOSIS — I5033 Acute on chronic diastolic (congestive) heart failure: Secondary | ICD-10-CM

## 2016-03-12 DIAGNOSIS — R0602 Shortness of breath: Secondary | ICD-10-CM

## 2016-03-12 DIAGNOSIS — R410 Disorientation, unspecified: Secondary | ICD-10-CM

## 2016-03-12 LAB — BLOOD GAS, ARTERIAL
Acid-Base Excess: 24.8 mmol/L — ABNORMAL HIGH (ref 0.0–2.0)
Bicarbonate: 47.4 mEq/L — ABNORMAL HIGH (ref 20.0–24.0)
DELIVERY SYSTEMS: POSITIVE
Drawn by: 129711
EXPIRATORY PAP: 5
FIO2: 0.4
INSPIRATORY PAP: 10
LHR: 16 {breaths}/min
O2 SAT: 91 %
PO2 ART: 55.7 mmHg — AB (ref 80.0–100.0)
TCO2: 68.9 mmol/L (ref 0–100)
pCO2 arterial: 68.9 mmHg (ref 35.0–45.0)
pH, Arterial: 7.48 — ABNORMAL HIGH (ref 7.350–7.450)

## 2016-03-12 LAB — GLUCOSE, CAPILLARY
GLUCOSE-CAPILLARY: 165 mg/dL — AB (ref 65–99)
GLUCOSE-CAPILLARY: 181 mg/dL — AB (ref 65–99)
GLUCOSE-CAPILLARY: 183 mg/dL — AB (ref 65–99)
Glucose-Capillary: 132 mg/dL — ABNORMAL HIGH (ref 65–99)
Glucose-Capillary: 299 mg/dL — ABNORMAL HIGH (ref 65–99)
Glucose-Capillary: 277 mg/dL — ABNORMAL HIGH (ref 65–99)

## 2016-03-12 LAB — CBC
HCT: 42.7 % (ref 36.0–46.0)
Hemoglobin: 12.2 g/dL (ref 12.0–15.0)
MCH: 29.8 pg (ref 26.0–34.0)
MCHC: 28.6 g/dL — ABNORMAL LOW (ref 30.0–36.0)
MCV: 104.1 fL — ABNORMAL HIGH (ref 78.0–100.0)
PLATELETS: 127 10*3/uL — AB (ref 150–400)
RBC: 4.1 MIL/uL (ref 3.87–5.11)
RDW: 14.6 % (ref 11.5–15.5)
WBC: 5 10*3/uL (ref 4.0–10.5)

## 2016-03-12 LAB — MRSA PCR SCREENING: MRSA BY PCR: NEGATIVE

## 2016-03-12 LAB — BASIC METABOLIC PANEL
Anion gap: 10 (ref 5–15)
BUN: 21 mg/dL — ABNORMAL HIGH (ref 6–20)
CALCIUM: 8.6 mg/dL — AB (ref 8.9–10.3)
CO2: 49 mmol/L — ABNORMAL HIGH (ref 22–32)
CREATININE: 0.66 mg/dL (ref 0.44–1.00)
Chloride: 81 mmol/L — ABNORMAL LOW (ref 101–111)
GFR calc non Af Amer: >60 mL/min (ref >60)
Glucose, Bld: 161 mg/dL — ABNORMAL HIGH (ref 65–99)
Potassium: 4.2 mmol/L (ref 3.5–5.1)
SODIUM: 140 mmol/L (ref 135–145)

## 2016-03-12 MED ORDER — INSULIN ASPART 100 UNIT/ML ~~LOC~~ SOLN
0.0000 [IU] | Freq: Every day | SUBCUTANEOUS | Status: DC
  Administered 2016-03-12: 3 [IU] via SUBCUTANEOUS

## 2016-03-12 MED ORDER — VANCOMYCIN HCL IN DEXTROSE 750-5 MG/150ML-% IV SOLN
750.0000 mg | Freq: Two times a day (BID) | INTRAVENOUS | Status: DC
  Administered 2016-03-12 – 2016-03-16 (×8): 750 mg via INTRAVENOUS
  Filled 2016-03-12 (×10): qty 150

## 2016-03-12 MED ORDER — PIPERACILLIN-TAZOBACTAM 3.375 G IVPB
3.3750 g | Freq: Three times a day (TID) | INTRAVENOUS | Status: DC
  Administered 2016-03-12 – 2016-03-19 (×21): 3.375 g via INTRAVENOUS
  Filled 2016-03-12 (×19): qty 50

## 2016-03-12 MED ORDER — VANCOMYCIN HCL 10 G IV SOLR
1500.0000 mg | Freq: Once | INTRAVENOUS | Status: AC
  Administered 2016-03-12: 1500 mg via INTRAVENOUS
  Filled 2016-03-12: qty 1500

## 2016-03-12 MED ORDER — CETYLPYRIDINIUM CHLORIDE 0.05 % MT LIQD
7.0000 mL | Freq: Two times a day (BID) | OROMUCOSAL | Status: DC
  Administered 2016-03-12 – 2016-03-19 (×15): 7 mL via OROMUCOSAL

## 2016-03-12 MED ORDER — INSULIN ASPART 100 UNIT/ML ~~LOC~~ SOLN
0.0000 [IU] | Freq: Three times a day (TID) | SUBCUTANEOUS | Status: DC
  Administered 2016-03-13: 5 [IU] via SUBCUTANEOUS
  Administered 2016-03-13: 3 [IU] via SUBCUTANEOUS

## 2016-03-12 MED ORDER — DILTIAZEM HCL 30 MG PO TABS
30.0000 mg | ORAL_TABLET | Freq: Four times a day (QID) | ORAL | Status: DC
  Administered 2016-03-12 – 2016-03-13 (×4): 30 mg via ORAL
  Filled 2016-03-12 (×4): qty 1

## 2016-03-12 NOTE — Progress Notes (Signed)
RT note- Patient taken off Bipap for a trial, remains on 4l/min [place], ABG result given to RN. Wears 2.0-2.5l/min [place] at home, currently sp02 on 4l/min 92-97%.

## 2016-03-12 NOTE — Evaluation (Signed)
Physical Therapy Re-Evaluation Patient Details Name: Kathy CuriaVirginia L Mehlhoff MRN: [id number] DOB: 01/03/1931 Today's Date: 03/12/2016   History of Present Illness  82 yo F admitted with worsening SOB over the last 3-4 weeks.  DX: CHF with B pleural effusions.  S/P thoracentesis 5/22 with removal of 1L.  Echo with depressed EF of 45%.  3/22 - pt had some altered mental status with ABG was 7.22/109/ 63, and CO2>40 - requiring BiPAP, and therefore transferred to ICU.  PMH: A-fib, not on anticoagulation due to GI bleed, CAD with RCA stenting, MI, HTN, DM2, asthma, COPD, cholecystectomy, exploratory laparotomy, partial colectomy.   Clinical Impression  Pt received in bed, both dtr's present, and pt is agreeable to PT re-evaluation.  Pt found to be saturated in urine despite having a foley catheter in.  RN notified.  Pt assisted with hygiene and gown change.  Pt is now on 4L of supplemental O2 in the ICU.  Pt was able to transfer supine<>sit with Min A, and was able to take a few steps with RW and Min A from the bed<>chair.  Vitals during tx are as follows:  Resting BP: 103/53, HR: 84bpm, and SpO2: 96% on 4L After transfer to the chair BP sitting: 86/55, HR: 84, SpO2: 85% on 4L Recovery after 2-3 min, B feet elevated, and SCD's donned BP: 97/56, HR: 83bpm, and SpO2: 94% on 4L Pt expressed still feeling weak, but she feels stronger than yesterday.  Will continue to progress pt with strength, balance, and endurance, but continue to recommend SNF at d/c.     Follow Up Recommendations SNF    Equipment Recommendations  None recommended by PT    Recommendations for Other Services       Precautions / Restrictions Precautions Precautions: Fall Restrictions Weight Bearing Restrictions: No      Mobility  Bed Mobility Overal bed mobility: Needs Assistance Bed Mobility: Supine to Sit     Supine to sit: Min assist;HOB elevated        Transfers Overall transfer level: Needs assistance Equipment used:  Rolling walker (2 wheeled) Transfers: Sit to/from [employer];Stand Pivot Transfers Sit to Stand: Min assist;From elevated surface Stand pivot transfers: Min assist       General transfer comment: From elevated ICU bed.  Pt pulls up on the walker instead of pushing from the bed.  Pt was able to take a few steps to transfer from the bed<>chair.   Ambulation/Gait                Stairs            Wheelchair Mobility    Modified Rankin (Stroke Patients Only)       Balance   Sitting-balance support: Bilateral upper extremity supported Sitting balance-Leahy Scale: Fair     Standing balance support: Bilateral upper extremity supported Standing balance-Leahy Scale: Fair                               Pertinent Vitals/Pain Pain Assessment: No/denies pain    Home Living Family/patient expects to be discharged to:: Skilled nursing facility Living Arrangements: Alone Available Help at Discharge: Friend(s) Type of Home: House Home Access: Ramped entrance     Home Layout: One level Home Equipment: Walker - 4 wheels Additional Comments: Home O2    Prior Function     Gait / Transfers Assistance Needed: Dtr Glendon Axe- [place] (from [place] of [place]) states that she normally does  not use anything to ambulate in the house, however she uses her rollator when out in the community.  Pt is mostly a household ambulator, and admits to furniture walking.  Dtr states that she became more home bound when they switched her O2 tank over to the larger tankls.   ADL's / Homemaking Assistance Needed: Pt states that she is normally able to do her own dressing, and bathing, until last week where she had to call for assistance to get out of the bathtub.  Pt's dtr assists her with running errands and does the grocery shopping.         Hand Dominance        Extremity/Trunk Assessment   Upper Extremity Assessment: Generalized weakness           Lower Extremity Assessment: Generalized  weakness         Communication      Cognition Arousal/Alertness: Awake/alert Behavior During Therapy: WFL for tasks assessed/performed Overall Cognitive Status: Within Functional Limits for tasks assessed                      General Comments      Exercises        Assessment/Plan    PT Assessment Patient needs continued PT services  PT Diagnosis Difficulty walking;Abnormality of gait;Generalized weakness   PT Problem List Decreased strength;Decreased range of motion;Decreased activity tolerance;Decreased balance;Decreased mobility;Decreased knowledge of use of DME;Decreased safety awareness;Decreased knowledge of precautions;Cardiopulmonary status limiting activity;Decreased skin integrity  PT Treatment Interventions DME instruction;Gait training;Stair training;Functional mobility training;Therapeutic activities;Therapeutic exercise;Balance training;Patient/family education   PT Goals (Current goals can be found in the Care Plan section) Acute Rehab PT Goals Patient Stated Goal: Pt wants to get her strength back PT Goal Formulation: With patient/family Time For Goal Achievement: 03/18/16 Potential to Achieve Goals: Good    Frequency Min 5X/week   Barriers to discharge Decreased caregiver support      Co-evaluation               End of Session Equipment Utilized During Treatment: Gait belt;Oxygen Activity Tolerance: Patient limited by fatigue Patient left: in chair;with family/visitor present;with SCD's reapplied Nurse Communication: Mobility status         Time: 1610-9604 PT Time Calculation (min) (ACUTE ONLY): 34 min   Charges:     PT Treatments $Therapeutic Activity: 23-37 mins   PT G Codes:        Beth Jaquay Morneault, PT, DPT X: [id number]   03/12/2016, 1:59 PM

## 2016-03-12 NOTE — Progress Notes (Signed)
Subjective: She was transferred to ICU last night because of acute on chronic hypoxic and hypercapnic respiratory failure. She had improved after thoracentesis but yesterday morning still had some increased work of breathing although not in distress. However as the day went on she had more trouble which culminated in her transfer. Based on her blood gas I agree she probably has some element of hypercapnia at baseline. Chest x-ray last night showed what appeared to be some increase in size of the left pleural effusion but also what looks like a right lower lobe infiltrate. She seems to be much more comfortable this morning. She says she wants something to drink. Communication is difficult because of the BiPAP and because she's hard of hearing  Objective: Vital signs in last 24 hours: Temp:  [97 F (36.1 C)-98.2 F (36.8 C)] 97.6 F (36.4 C) (05/24 0400) Pulse Rate:  [68-102] 84 (05/24 0500) Resp:  [16-28] 16 (05/24 0343) BP: (95-135)/(47-74) 96/57 mmHg (05/24 0500) SpO2:  [87 %-100 %] 97 % (05/24 0500) FiO2 (%):  [40 %] 40 % (05/24 0343) Weight:  [78.4 kg (172 lb 13.5 oz)] 78.4 kg (172 lb 13.5 oz) (05/24 0500) Weight change:  Last BM Date: 03/09/16  Intake/Output from previous day: 05/23 0701 - 05/24 0700 In: 1080 [P.O.:1080] Out: 650 [Urine:650]  PHYSICAL EXAM General appearance: alert and mild distress Resp: rhonchi bilaterally Cardio: irregularly irregular rhythm GI: soft, non-tender; bowel sounds normal; no masses,  no organomegaly Extremities: extremities normal, atraumatic, no cyanosis or edema  Lab Results:  Results for orders placed or performed during the hospital encounter of 03/09/16 (from the past 48 hour(s))  Glucose, capillary     Status: Abnormal   Collection Time: 03/10/16 11:25 AM  Result Value Ref Range   Glucose-Capillary 126 (H) 65 - 99 mg/dL  Lactate dehydrogenase (CSF, pleural or peritoneal fluid)     Status: Abnormal   Collection Time: 03/10/16  1:00 PM   Result Value Ref Range   LD, Fluid 63 (H) 3 - 23 U/L    Comment: (NOTE) Results should be evaluated in conjunction with serum values    Fluid Type-FLDH FLUID     Comment: LEFT PLEURAL COLLECTED BY DOCTOR BOLES CORRECTED ON 05/22 AT 1413: PREVIOUSLY REPORTED AS Pleural, L   Protein, fluid - pleural or peritoneal     Status: None   Collection Time: 03/10/16  1:00 PM  Result Value Ref Range   Total protein, fluid <3.0 g/dL    Comment: (NOTE) No normal range established for this test Results should be evaluated in conjunction with serum values    Fluid Type-FTP FLUID     Comment: LEFT PLEURAL COLLECTED BY DOCTOR BOLES CORRECTED ON 05/22 AT 1414: PREVIOUSLY REPORTED AS Pleural, L   Glucose, pleural or peritoneal fluid     Status: None   Collection Time: 03/10/16  1:00 PM  Result Value Ref Range   Glucose, Fluid 151 mg/dL    Comment: (NOTE) No normal range established for this test Results should be evaluated in conjunction with serum values    Fluid Type-FGLU FLUID     Comment: LEFT PLEURAL COLLECTED BY DOCTOR BOLES CORRECTED ON 05/22 AT 1415: PREVIOUSLY REPORTED AS Pleural, L   Body fluid cell count with differential     Status: Abnormal   Collection Time: 03/10/16  1:00 PM  Result Value Ref Range   Fluid Type-FCT FLUID     Comment: LEFT PLEURAL COLLECTED BY DOCTOR BOLES CORRECTED ON 05/22 AT 1413: PREVIOUSLY  REPORTED AS PLEURAL    Color, Fluid YELLOW YELLOW   Appearance, Fluid HAZY (A) CLEAR   WBC, Fluid 362 0 - 1000 cu mm   Neutrophil Count, Fluid 3 0 - 25 %   Lymphs, Fluid 84 %   Monocyte-Macrophage-Serous Fluid 13 (L) 50 - 90 %   Eos, Fluid 0 %   Other Cells, Fluid MANY %    Comment: OTHER CELLS IDENTIFIED AS MESOTHELIAL CELLS  Culture, body fluid-bottle     Status: None (Preliminary result)   Collection Time: 03/10/16  1:00 PM  Result Value Ref Range   Specimen Description FLUID LEFT PLEURAL COLLECTED BY DOCTOR BOLES    Special Requests BOTTLES  DRAWN AEROBIC AND ANAEROBIC 10CC EACH    Culture NO GROWTH < 24 HOURS    Report Status PENDING   Gram stain     Status: None   Collection Time: 03/10/16  1:00 PM  Result Value Ref Range   Specimen Description FLUID LEFT PLEURAL COLLECTED BY DOCTOR BOLES    Special Requests NONE    Gram Stain      CYTOSPIN SMEAR WBC PRESENT, PREDOMINANTLY MONONUCLEAR NO ORGANISMS SEEN Performed at [facility]    Report Status 03/10/2016 FINAL   Glucose, capillary     Status: Abnormal   Collection Time: 03/10/16  5:08 PM  Result Value Ref Range   Glucose-Capillary 143 (H) 65 - 99 mg/dL  Glucose, capillary     Status: Abnormal   Collection Time: 03/10/16  8:10 PM  Result Value Ref Range   Glucose-Capillary 182 (H) 65 - 99 mg/dL   Comment 1 Notify RN    Comment 2 Document in Chart   CBC     Status: Abnormal   Collection Time: 03/11/16  6:23 AM  Result Value Ref Range   WBC 7.2 4.0 - 10.5 K/uL   RBC 4.20 3.87 - 5.11 MIL/uL   Hemoglobin 12.4 12.0 - 15.0 g/dL   HCT 44.4 36.0 - 46.0 %   MCV 105.7 (H) 78.0 - 100.0 fL   MCH 29.5 26.0 - 34.0 pg   MCHC 27.9 (L) 30.0 - 36.0 g/dL   RDW 14.8 11.5 - 15.5 %   Platelets 145 (L) 150 - 400 K/uL  Basic metabolic panel     Status: Abnormal   Collection Time: 03/11/16  6:23 AM  Result Value Ref Range   Sodium 141 135 - 145 mmol/L   Potassium 3.8 3.5 - 5.1 mmol/L   Chloride 83 (L) 101 - 111 mmol/L   CO2 >50 (H) 22 - 32 mmol/L   Glucose, Bld 127 (H) 65 - 99 mg/dL   BUN 16 6 - 20 mg/dL   Creatinine, Ser 0.72 0.44 - 1.00 mg/dL   Calcium 8.6 (L) 8.9 - 10.3 mg/dL   GFR calc non Af Amer >60 >60 mL/min   GFR calc Af Amer >60 >60 mL/min    Comment: (NOTE) The eGFR has been calculated using the CKD EPI equation. This calculation has not been validated in all clinical situations. eGFR's persistently <60 mL/min signify possible Chronic Kidney Disease.   Glucose, capillary     Status: Abnormal   Collection Time: 03/11/16  7:26 AM  Result Value Ref Range    Glucose-Capillary 122 (H) 65 - 99 mg/dL   Comment 1 Notify RN    Comment 2 Document in Chart   Glucose, capillary     Status: Abnormal   Collection Time: 03/11/16 11:23 AM  Result Value Ref Range  Glucose-Capillary 177 (H) 65 - 99 mg/dL   Comment 1 Notify RN    Comment 2 Document in Chart   Glucose, capillary     Status: Abnormal   Collection Time: 03/11/16  4:02 PM  Result Value Ref Range   Glucose-Capillary 178 (H) 65 - 99 mg/dL   Comment 1 Notify RN    Comment 2 Document in Chart   Blood gas, arterial     Status: Abnormal   Collection Time: 03/11/16  6:22 PM  Result Value Ref Range   O2 Content 4.0 L/min   Delivery systems NASAL CANNULA    pH, Arterial 7.222 (L) 7.350 - 7.450   pCO2 arterial  35.0 - 45.0 mmHg    CRITICAL RESULT CALLED TO, READ BACK BY AND VERIFIED WITH:    Comment: RACHEL  EVERETT RN BY JESSICA LASLEY, RRT ON 03/11/2016 AT 1829 CRITICAL RESULT CALLED TO, READ BACK BY AND VERIFIED WITH: ABOVE REPORTABLE RANGE RACHEL EVERTT RN BY JESSICA LASLEY RRT ON 03/11/2016 AT 1 CRITICAL RESULT CALLED TO, READ BACK BY AND VERIFIED WITH: ABOVE REPORTABLE RANGE RAEPORTABLE RANGE R. EVERTT BY J. LASLEY ON 03/11/16 AT 1829    pO2, Arterial 63.2 (L) 80.0 - 100.0 mmHg   O2 Saturation 88.5 %   Collection site RIGHT RADIAL    Drawn by COLLECTED BY RT    Sample type ARTERIAL    Allens test (pass/fail) PASS PASS  Glucose, capillary     Status: Abnormal   Collection Time: 03/11/16  8:44 PM  Result Value Ref Range   Glucose-Capillary 109 (H) 65 - 99 mg/dL   Comment 1 Notify RN    Comment 2 Document in Chart   Glucose, capillary     Status: Abnormal   Collection Time: 03/11/16 11:58 PM  Result Value Ref Range   Glucose-Capillary 183 (H) 65 - 99 mg/dL   Comment 1 Notify RN   Basic metabolic panel     Status: Abnormal   Collection Time: 03/12/16  4:12 AM  Result Value Ref Range   Sodium 140 135 - 145 mmol/L   Potassium 4.2 3.5 - 5.1 mmol/L   Chloride 81 (L) 101 - 111 mmol/L    CO2 49 (H) 22 - 32 mmol/L   Glucose, Bld 161 (H) 65 - 99 mg/dL   BUN 21 (H) 6 - 20 mg/dL   Creatinine, Ser 0.66 0.44 - 1.00 mg/dL   Calcium 8.6 (L) 8.9 - 10.3 mg/dL   GFR calc non Af Amer >60 >60 mL/min   GFR calc Af Amer >60 >60 mL/min    Comment: (NOTE) The eGFR has been calculated using the CKD EPI equation. This calculation has not been validated in all clinical situations. eGFR's persistently <60 mL/min signify possible Chronic Kidney Disease.    Anion gap 10 5 - 15  CBC     Status: Abnormal   Collection Time: 03/12/16  4:12 AM  Result Value Ref Range   WBC 5.0 4.0 - 10.5 K/uL   RBC 4.10 3.87 - 5.11 MIL/uL   Hemoglobin 12.2 12.0 - 15.0 g/dL   HCT 42.7 36.0 - 46.0 %   MCV 104.1 (H) 78.0 - 100.0 fL   MCH 29.8 26.0 - 34.0 pg   MCHC 28.6 (L) 30.0 - 36.0 g/dL   RDW 14.6 11.5 - 15.5 %   Platelets 127 (L) 150 - 400 K/uL  Glucose, capillary     Status: Abnormal   Collection Time: 03/12/16  4:19 AM  Result Value  Ref Range   Glucose-Capillary 165 (H) 65 - 99 mg/dL   Comment 1 Notify RN     ABGS  Recent Labs  03/11/16 1822  PHART 7.222*  PO2ART 63.2*   CULTURES Recent Results (from the past 240 hour(s))  Culture, body fluid-bottle     Status: None (Preliminary result)   Collection Time: 03/10/16  1:00 PM  Result Value Ref Range Status   Specimen Description FLUID LEFT PLEURAL COLLECTED BY DOCTOR BOLES  Final   Special Requests BOTTLES DRAWN AEROBIC AND ANAEROBIC 10CC EACH  Final   Culture NO GROWTH < 24 HOURS  Final   Report Status PENDING  Incomplete  Gram stain     Status: None   Collection Time: 03/10/16  1:00 PM  Result Value Ref Range Status   Specimen Description FLUID LEFT PLEURAL COLLECTED BY DOCTOR BOLES  Final   Special Requests NONE  Final   Gram Stain   Final    CYTOSPIN SMEAR WBC PRESENT, PREDOMINANTLY MONONUCLEAR NO ORGANISMS SEEN Performed at [facility]    Report Status 03/10/2016 FINAL  Final   Studies/Results: Dg Chest 1  View  03/10/2016  CLINICAL DATA:  LEFT pleural effusion post thoracentesis EXAM: CHEST 1 VIEW COMPARISON:  03/09/2016 FINDINGS: Expiratory technique. Significant decrease in LEFT pleural effusion post thoracentesis. No pneumothorax. Persistent small to moderate LEFT pleural effusion and basilar atelectasis identified. Small RIGHT pleural effusion also noted. Patient rotated to the LEFT. Dense atherosclerotic calcification and aorta. Question enlargement of cardiac silhouette. Diffuse osseous demineralization. IMPRESSION: Significant decrease in LEFT pleural effusion post thoracentesis. No pneumothorax. Electronically Signed   By: Lavonia Dana M.D.   On: 03/10/2016 13:57   Dg Chest Port 1 View  03/11/2016  CLINICAL DATA:  Shortness of breath and weakness EXAM: PORTABLE CHEST 1 VIEW COMPARISON:  03/10/2016 FINDINGS: Cardiac shadow is stable. Some slight increase in the degree of left pleural effusion is noted from the prior exam. Increased density is also noted in the right base likely related to early infiltrate. No pneumothorax is seen. No bony abnormality is noted. IMPRESSION: Slight increase in left pleural effusion with new right basilar infiltrate. Electronically Signed   By: Inez Catalina M.D.   On: 03/11/2016 20:07   US Thoracentesis Asp Pleural Space W/img Guide  03/10/2016  INDICATION: LEFT pleural effusion EXAM: ULTRASOUND GUIDED DIAGNOSTIC AND THERAPEUTIC LEFT THORACENTESIS MEDICATIONS: None. COMPLICATIONS: None immediate. PROCEDURE: Procedure, benefits, and risks of procedure were discussed with patient. Written informed consent for procedure was obtained. Time out protocol followed. Pleural effusion localized by ultrasound at the posterior LEFT hemithorax. Skin prepped and draped in usual sterile fashion. Skin and soft tissues anesthetized with 10 mL of 1% lidocaine. 8 French thoracentesis catheter placed into the 1000 mL pleural space. 1000 mL of amber color fluid was aspirated by syringe pump.  Procedure tolerated well by patient without immediate complication. FINDINGS: A total of approximately 1000 mL of LEFT pleural fluid was removed. 180 mL of fluid was sent to the laboratory as requested by the clinical team for analysis. IMPRESSION: Successful ultrasound guided LEFT thoracentesis yielding 1000 mL of pleural fluid. Electronically Signed   By: Lavonia Dana M.D.   On: 03/10/2016 14:08    Medications:  Prior to Admission:  Prescriptions prior to admission  Medication Sig Dispense Refill Last Dose  . acetaminophen (TYLENOL) 500 MG tablet Take 1,000 mg by mouth every 6 (six) hours as needed for mild pain.   03/08/2016  . aspirin EC 81  MG tablet Take 81 mg by mouth daily.   03/09/2016  . diltiazem (CARDIZEM CD) 180 MG 24 hr capsule Take 1 capsule (180 mg total) by mouth daily. 90 capsule 3 03/09/2016  . furosemide (LASIX) 40 MG tablet Take 40 mg by mouth daily.     03/09/2016  . ipratropium-albuterol (DUONEB) 0.5-2.5 (3) MG/3ML SOLN Take 3 mLs by nebulization as needed.     Past Month  . LORazepam (ATIVAN) 1 MG tablet Take 0.5 mg by mouth daily.    03/09/2016  . omeprazole (PRILOSEC) 20 MG capsule Take 20 mg by mouth at bedtime.    03/08/2016  . atorvastatin (LIPITOR) 10 MG tablet Take 10 mg by mouth daily. Reported on 12/04/2015   Taking  . nitroGLYCERIN (NITROSTAT) 0.4 MG SL tablet Place 1 tablet (0.4 mg total) under the tongue every 5 (five) minutes as needed for chest pain. 25 tablet 3 Never  . NON FORMULARY Pt on 2.5 liters of O2 daily   Taking   Scheduled: . antiseptic oral rinse  7 mL Mouth Rinse BID  . antiseptic oral rinse  7 mL Mouth Rinse q12n4p  . aspirin EC  81 mg Oral Daily  . carvedilol  6.25 mg Oral BID WC  . chlorhexidine  15 mL Mouth Rinse BID  . diltiazem  60 mg Oral Q6H  . furosemide  60 mg Intravenous Q8H  . heparin subcutaneous  5,000 Units Subcutaneous Q8H  . insulin aspart  0-9 Units Subcutaneous Q4H  . ipratropium-albuterol  3 mL Nebulization Q6H  . LORazepam   0.5 mg Oral Daily  . methylPREDNISolone (SOLU-MEDROL) injection  60 mg Intravenous TID  . pantoprazole  40 mg Oral Daily  . sodium chloride flush  3 mL Intravenous Q12H   Continuous:  OCA:[id number] **OR** [DISCONTINUED] acetaminophen, haloperidol lactate, ipratropium-albuterol, nitroGLYCERIN, [DISCONTINUED] ondansetron **OR** ondansetron (ZOFRAN) IV, polyethylene glycol  Assesment: She was admitted with acute on chronic hypoxic respiratory failure. At baseline she has COPD on home oxygen and I think she's also hypercapnic at baseline. She has a large left pleural effusion and seems to have acute on chronic diastolic heart failure. She's had trouble with atrial fibrillation. She had worsening problems with her respiratory failure and was transferred to the ICU. Principal Problem:   Acute on chronic respiratory failure with hypoxia (HCC) Active Problems:   Type 2 diabetes mellitus (HCC)   COPD (chronic obstructive pulmonary disease) (HCC)   Pleural effusion on left   Atrial fibrillation (HCC)   Status post thoracentesis   Pleural effusion, bilateral   Atrial fibrillation with RVR ([place])    Plan: Recheck blood gas this morning on BiPAP. Check chest x-ray. She may need another thoracentesis. Continue diuresis. Continue IV steroids and inhaled bronchodilators. She may be able to come off BiPAP. I'm going to add vancomycin and Zosyn for presumed healthcare associated pneumonia with the right lower lobe infiltrate.   LOS: 3 days   Khary Schaben L 03/12/2016, 7:54 AM

## 2016-03-12 NOTE — Progress Notes (Signed)
Primary cardiologist: Dr. Jonelle SidleSamuel G. McDowell  Seen for followup: Atrial fibrillation, cardiomyopathy, pleural effusions  Subjective:    On BiPAP this morning, more alert per family, less short of breath.  Objective:   Temp:  [97 F (36.1 C)-99.1 F (37.3 C)] 99.1 F (37.3 C) (05/24 0813) Pulse Rate:  [68-102] 84 (05/24 0500) Resp:  [16-28] 16 (05/24 0343) BP: (95-135)/(47-74) 96/57 mmHg (05/24 0500) SpO2:  [87 %-100 %] 97 % (05/24 0500) FiO2 (%):  [40 %] 40 % (05/24 0343) Weight:  [172 lb 13.5 oz (78.4 kg)] 172 lb 13.5 oz (78.4 kg) (05/24 0500) Last BM Date: 03/09/16  Filed Weights   03/10/16 0516 03/11/16 2300 03/12/16 0500  Weight: 166 lb 3.6 oz (75.4 kg) 172 lb 13.5 oz (78.4 kg) 172 lb 13.5 oz (78.4 kg)    Intake/Output Summary (Last 24 hours) at 03/12/16 0836 Last data filed at 03/12/16 0500  Gross per 24 hour  Intake    840 ml  Output    650 ml  Net    190 ml    Telemetry: Atrial fibrillation in the 60s to 70s.  Exam:  General: Elderly woman in no acute distress.  HEENT: Wearing BiPAP.  Lungs: Diminished breath sounds throughout, decreased at the bases.  Cardiac: Diminished, irregularly irregular without gallop.  Abdomen: NABS.  Extremities: Ankle edema.  Lab Results:  Basic Metabolic Panel:  Recent Labs Lab 03/10/16 0445 03/11/16 0623 03/12/16 0412  NA 142 141 140  K 3.5 3.8 4.2  CL 86* 83* 81*  CO2 48* >50* 49*  GLUCOSE 135* 127* 161*  BUN 19 16 21*  CREATININE 0.54 0.72 0.66  CALCIUM 8.5* 8.6* 8.6*    Liver Function Tests:  Recent Labs Lab 03/10/16 0625  AST 16  ALT 16  ALKPHOS 42  BILITOT 0.7  PROT 5.8*  ALBUMIN 3.4*    CBC:  Recent Labs Lab 03/10/16 0445 03/11/16 0623 03/12/16 0412  WBC 6.7 7.2 5.0  HGB 12.5 12.4 12.2  HCT 43.9 44.4 42.7  MCV 104.5* 105.7* 104.1*  PLT 152 145* 127*    Cardiac Enzymes:  Recent Labs Lab 03/09/16 1640  TROPONINI 0.03    Coagulation:  Recent Labs Lab 03/10/16 0625    INR 1.04    Echocardiogram 03/10/2016: Study Conclusions  - Left ventricle: The cavity size was normal. Wall thickness was  increased in a pattern of mild LVH. Systolic function was mildly  to moderately reduced. The estimated ejection fraction was in the  range of 40% to 45%. Diffuse hypokinesis. There is akinesis of  the basalinferior myocardium. The study is not technically  sufficient to allow evaluation of LV diastolic function. - Ventricular septum: Septal motion showed abnormal function and  dyssynergy. The contour showed diastolic flattening. - Aortic valve: Trileaflet; mildly calcified leaflets. Left  coronary cusp mobility was restricted. There was trivial  regurgitation. - Mitral valve: Calcified annulus. There was mild to moderate  regurgitation. - Left atrium: The atrium was moderately dilated. - Right ventricle: The cavity size was severely dilated. Systolic  function was severely reduced. - Right atrium: The atrium was moderately dilated. Central venous  pressure (est): 8 mm Hg. - Tricuspid valve: There was mild regurgitation. - Pulmonary arteries: PA peak pressure: 30 mm Hg (S). - Pericardium, extracardiac: A small pericardial effusion was  identified circumferential to the heart. There was a left pleural  effusion.  Impressions:  - Mild LVH with LVEF 40-45%, diffuse hypokinesis with akinesis of  the  basal inferior wall. Indeterminate diastolic function in the  setting of atrial fibrillation. Septal dyssynergy suggesting IVCD  and diastolic septal flattening also noted. Moderate left atrial  enlargement. MAC with mild to moderate mitral regurgitation.  Sclerotic aortic valve with trivial aortic regurgitation.  Severely dilated right ventricle with severely reduced  contraction. Moderate right atrial enlargement. Mild tricuspid  regurgitation with PASP 30 mmHg. Left pleural effusion noted as  well as small pericardial  effusion.   Medications:   Scheduled Medications: . antiseptic oral rinse  7 mL Mouth Rinse BID  . antiseptic oral rinse  7 mL Mouth Rinse q12n4p  . aspirin EC  81 mg Oral Daily  . carvedilol  6.25 mg Oral BID WC  . chlorhexidine  15 mL Mouth Rinse BID  . diltiazem  60 mg Oral Q6H  . furosemide  60 mg Intravenous Q8H  . heparin subcutaneous  5,000 Units Subcutaneous Q8H  . insulin aspart  0-9 Units Subcutaneous Q4H  . ipratropium-albuterol  3 mL Nebulization Q6H  . LORazepam  0.5 mg Oral Daily  . methylPREDNISolone (SOLU-MEDROL) injection  60 mg Intravenous TID  . pantoprazole  40 mg Oral Daily  . piperacillin-tazobactam (ZOSYN)  IV  3.375 g Intravenous Q8H  . sodium chloride flush  3 mL Intravenous Q12H  . vancomycin  1,500 mg Intravenous Once     PRN Medications:  acetaminophen **OR** [DISCONTINUED] acetaminophen, haloperidol lactate, ipratropium-albuterol, nitroGLYCERIN, [DISCONTINUED] ondansetron **OR** ondansetron (ZOFRAN) IV, polyethylene glycol   Assessment:   1. Persistent atrial fibrillation, heart rate significantly improved. She is currently on Coreg and diltiazem. CHADSVASC score is 5. Eventually will consider switching from aspirin to Eliquis once she has stabilized further.  2. Bilateral pleural effusions, left greater than right in the setting of acute on chronic combined heart failure with LVEF 40-45%. She has been transitioned to higher dose IV Lasix, also status post left-sided thoracentesis with removal of 1000 cc fluid.  3. Acute on chronic hypoxic/hypercarbic respiratory failure, improved with BiPAP and transferred to the ICU. She is being followed by Dr. Juanetta Gosling.  4. Right lower lobe infiltrate concerning for pneumonia. Currently on broad-spectrum antibiotics.  5. Deconditioning.  6. Oxygen-dependent COPD.  7. CAD status post previous RCA interventions in 1999 2001. Troponin I levels normal. No active angina.   Plan/Discussion:    Discussed with  patient and family. Will reduce dose of diltiazem to avoid significant bradycardia. May try to transition to Coreg alone for heart rate control in light of her cardiomyopathy if she tolerates this from a pulmonary perspective. Agree with increase in IV Lasix for further diuresis. As blood pressure further stabilizes will consider adding low-dose ARB. Deferring any change to Eliquis until she is more stable.   Jonelle Sidle, M.D., F.A.C.C.

## 2016-03-12 NOTE — Care Management Important Message (Signed)
Important Message  Patient Details  Name: [name] MRN: [id number] Date of Birth: 05/11/1931   Medicare Important Message Given:  Yes    Malcolm MetroChildress, Lucile Didonato Demske, RN 03/12/2016, 11:40 AM

## 2016-03-12 NOTE — Clinical Social Work Placement (Signed)
   CLINICAL SOCIAL WORK PLACEMENT  NOTE  Date:  03/12/2016  Patient Details  Name: [name] MRN: [id number] Date of Birth: 10/31/1930  Clinical Social Work is seeking post-discharge placement for this patient at the Skilled  Nursing Facility level of care (*CSW will initial, date and re-position this form in  chart as items are completed):  Yes   Patient/family provided with [place] Clinical Social Work Department's list of facilities offering this level of care within the geographic area requested by the patient (or if unable, by the patient's family).  Yes   Patient/family informed of their freedom to choose among providers that offer the needed level of care, that participate in Medicare, Medicaid or managed care program needed by the patient, have an available bed and are willing to accept the patient.  Yes   Patient/family informed of 's ownership interest in [facility] and [facility], as well as of the fact that they are under no obligation to receive care at these facilities.  PASRR submitted to EDS on 03/11/16     PASRR number received on 03/11/16     Existing PASRR number confirmed on       FL2 transmitted to all facilities in geographic area requested by pt/family on 03/11/16     FL2 transmitted to all facilities within larger geographic area on       Patient informed that his/her managed care company has contracts with or will negotiate with certain facilities, including the following:        Yes   Patient/family informed of bed offers received.  [facility] bed at [facility]     Physician recommends and patient chooses bed at      [facility] on  .  Patient to be transferred to facility by       Patient family notified on   of transfer.  Name of family member notified:        PHYSICIAN       Additional Comment:    _______________________________________________ Karn CassisStultz, Kadience Macchi  [place], LCSW 03/12/2016, 8:48 AM [phone]

## 2016-03-12 NOTE — Progress Notes (Signed)
PROGRESS NOTE    [name]  UJW:[id number] DOB: 07/31/1931 DOA: 03/09/2016 PCP: Dwana MelenaZack Hall, MD   Brief Narrative:  82 y.o. female with a history of atrial fibrillation with a chads score of 5 on aspirin and not on anticoagulation secondary to history of GI bleed, coronary artery disease with stenting of the RCA and a history of inferior wall MI in 1999, hypertension, type 2 diabetes, hyperlipidemia.She was recently diagnosed with atrial fibrillation in the outpatient setting and was being worked up by cardiology.  He presented to the hospital with weakness and fatigue, found to be in CHF with bilateral pleural effusion, cardiology and pulmonary were consulted, she underwent 1 Page thoracentesis and left lung fluid removal on 03/10/2016 which appears to be transudate due to CHF. However her echogram shows a depressed EF with wall motion of normality.   Assessment & Plan:   Principal Problem:   Acute on chronic respiratory failure with hypoxia (HCC) Active Problems:   Type 2 diabetes mellitus (HCC)   COPD (chronic obstructive pulmonary disease) (HCC)   Pleural effusion on left   Atrial fibrillation (HCC)   Status post thoracentesis   Pleural effusion, bilateral   Atrial fibrillation with RVR (HCC)   1. Acute on chronic hypoxic respiratory failure due to bilateral pleural effusions -left more than right.  -All secondary to acute on chronic combined systolic and diastolic CHF EF 56%45%. -She is status post left-sided thoracentesis with 1 Page transudative fluid removal on 03/10/2016 -Overnight events noted. Pt became developed hypercarbic failure, requiring transfer to SDU and bipap support -continue diuresis with Lasix -Clinically improved this AM  2. New diagnosis of persistent atrial fibrillation.  -[place] vasc 2 score of close to 5.  -Currently on Cardizem and aspirin,  -Cardiology following, Rex 2 consider Eliquis when patient is more stable   3. New diagnosis of combined systolic  and diastolic CHF EF 45% and with wall motion of normality's.  -Cardiology following. -Continue diuresis as tolerated  4. Diet-controlled type 2 diabetes mellitus -Continue sliding scale coverage as needed -Glucose overall stable   DVT prophylaxis: Heparin subcutaneous Code Status: Full Family Communication: Patient in room, multiple family members at bedside Disposition Plan: Skilled nursing facility, timing uncertain   Consultants:   Pulmonary  Cardiology  Procedures:  TTE - with LVH, EF 45% and diffuse wall motion abnormalities  Left-sided thoracentesis with 1 Page transudative fluid removed on 03/10/2016  Antimicrobials:  Anti-infectives    Start     Dose/Rate Route Frequency Ordered Stop   03/12/16 2100  vancomycin (VANCOCIN) IVPB 750 mg/150 ml premix     750 mg 150 mL/hr over 60 Minutes Intravenous Every 12 hours 03/12/16 1313     03/12/16 1000  vancomycin (VANCOCIN) 1,500 mg in sodium chloride 0.9 % 500 mL IVPB     1,500 mg 250 mL/hr over 120 Minutes Intravenous  Once 03/12/16 0802 03/12/16 1114   03/12/16 0900  piperacillin-tazobactam (ZOSYN) IVPB 3.375 g     3.375 g 12.5 mL/hr over 240 Minutes Intravenous Every 8 hours 03/12/16 0802            Subjective: Reports breathing better today. Patient is wanting something to eat  Objective: Filed Vitals:   03/12/16 1349 03/12/16 1400 03/12/16 1405 03/12/16 1500  BP: 103/53 93/48  94/51  Pulse: 84 86  77  Temp:      TempSrc:      Resp:  18  28  Height:      Weight:  SpO2: 96% 92% 94% 92%    Intake/Output Summary (Last 24 hours) at 03/12/16 1540 Last data filed at 03/12/16 1500  Gross per 24 hour  Intake   1010 ml  Output   1300 ml  Net   -290 ml   Filed Weights   03/10/16 0516 03/11/16 2300 03/12/16 0500  Weight: 75.4 kg (166 lb 3.6 oz) 78.4 kg (172 lb 13.5 oz) 78.4 kg (172 lb 13.5 oz)    Examination:  General exam: Appears calm and comfortable  Respiratory system: Clear to auscultation.  Respiratory effort normal. Cardiovascular system: S1 & S2 heard, RRR.  Gastrointestinal system: Abdomen is nondistended, soft and nontender. No organomegaly or masses felt. Normal bowel sounds heard. Central nervous system: Alert and oriented. No focal neurological deficits. Extremities: Symmetric 5 x 5 power. Skin: No rashes, lesions  Psychiatry: Judgement and insight appear normal. Mood & affect appropriate.     Data Reviewed: I have personally reviewed following labs and imaging studies  CBC:  Recent Labs Lab 03/09/16 1640 03/10/16 0445 03/11/16 0623 03/12/16 0412  WBC 7.4 6.7 7.2 5.0  HGB 14.1 12.5 12.4 12.2  HCT 48.7* 43.9 44.4 42.7  MCV 103.6* 104.5* 105.7* 104.1*  PLT 172 152 145* 127*   Basic Metabolic Panel:  Recent Labs Lab 03/09/16 1640 03/10/16 0445 03/11/16 0623 03/12/16 0412  NA 142 142 141 140  K 3.5 3.5 3.8 4.2  CL 87* 86* 83* 81*  CO2 46* 48* >50* 49*  GLUCOSE 206* 135* 127* 161*  BUN 21*  CREATININE 0.67 0.54 0.72 0.66  CALCIUM 9.0 8.5* 8.6* 8.6*   GFR: Estimated Creatinine Clearance: 54.9 mL/min (by C-G formula based on Cr of 0.66). Liver Function Tests:  Recent Labs Lab 03/10/16 0625  AST 16  ALT 16  ALKPHOS 42  BILITOT 0.7  PROT 5.8*  ALBUMIN 3.4*   No results for input(s): LIPASE, AMYLASE in the last 168 hours. No results for input(s): AMMONIA in the last 168 hours. Coagulation Profile:  Recent Labs Lab 03/10/16 0625  INR 1.04   Cardiac Enzymes:  Recent Labs Lab 03/09/16 1640  TROPONINI 0.03   BNP (last 3 results) No results for input(s): PROBNP in the last 8760 hours. HbA1C: No results for input(s): HGBA1C in the last 72 hours. CBG:  Recent Labs Lab 03/11/16 2044 03/11/16 2358 03/12/16 0419 03/12/16 0752 03/12/16 1112  GLUCAP 109* 183* 165* 132* 181*   Lipid Profile: No results for input(s): CHOL, HDL, LDLCALC, TRIG, CHOLHDL, LDLDIRECT in the last 72 hours. Thyroid Function Tests: No results for  input(s): TSH, T4TOTAL, FREET4, T3FREE, THYROIDAB in the last 72 hours. Anemia Panel: No results for input(s): VITAMINB12, FOLATE, FERRITIN, TIBC, IRON, RETICCTPCT in the last 72 hours. Sepsis Labs: No results for input(s): PROCALCITON, LATICACIDVEN in the last 168 hours.  Recent Results (from the past 240 hour(s))  Culture, body fluid-bottle     Status: None (Preliminary result)   Collection Time: 03/10/16  1:00 PM  Result Value Ref Range Status   Specimen Description FLUID LEFT PLEURAL COLLECTED BY DOCTOR BOLES  Final   Special Requests BOTTLES DRAWN AEROBIC AND ANAEROBIC 10CC EACH  Final   Culture NO GROWTH 2 DAYS  Final   Report Status PENDING  Incomplete  Gram stain     Status: None   Collection Time: 03/10/16  1:00 PM  Result Value Ref Range Status   Specimen Description FLUID LEFT PLEURAL COLLECTED BY DOCTOR BOLES  Final   Special Requests  NONE  Final   Gram Stain   Final    CYTOSPIN SMEAR WBC PRESENT, PREDOMINANTLY MONONUCLEAR NO ORGANISMS SEEN Performed at [facility]    Report Status 03/10/2016 FINAL  Final  MRSA PCR Screening     Status: None   Collection Time: 03/11/16 11:42 PM  Result Value Ref Range Status   MRSA by PCR NEGATIVE NEGATIVE Final    Comment:        The GeneXpert MRSA Assay (FDA approved for NASAL specimens only), is one component of a comprehensive MRSA colonization surveillance program. It is not intended to diagnose MRSA infection nor to guide or monitor treatment for MRSA infections.          Radiology Studies: Dg Chest Port 1 View  03/12/2016  CLINICAL DATA:  Respiratory failure EXAM: PORTABLE CHEST 1 VIEW COMPARISON:  Chest radiograph from one day prior. FINDINGS: Stable cardiomediastinal silhouette with mild cardiomegaly. No pneumothorax. Stable moderate left pleural effusion. Stable small right pleural effusion. Mild pulmonary edema is not appreciably changed. Patchy left greater than right bilateral lung opacities appear  stable. IMPRESSION: Stable chest radiograph with mild congestive heart failure, moderate left and small right pleural effusions and left greater than right bibasilar lung opacities favoring atelectasis. Electronically Signed   By: Delbert Phenix M.D.   On: 03/12/2016 10:20   Dg Chest Port 1 View  03/11/2016  CLINICAL DATA:  Shortness of breath and weakness EXAM: PORTABLE CHEST 1 VIEW COMPARISON:  03/10/2016 FINDINGS: Cardiac shadow is stable. Some slight increase in the degree of left pleural effusion is noted from the prior exam. Increased density is also noted in the right base likely related to early infiltrate. No pneumothorax is seen. No bony abnormality is noted. IMPRESSION: Slight increase in left pleural effusion with new right basilar infiltrate. Electronically Signed   By: Alcide Clever M.D.   On: 03/11/2016 20:07        Scheduled Meds: . antiseptic oral rinse  7 mL Mouth Rinse BID  . aspirin EC  81 mg Oral Daily  . carvedilol  6.25 mg Oral BID WC  . diltiazem  30 mg Oral Q6H  . furosemide  60 mg Intravenous Q8H  . heparin subcutaneous  5,000 Units Subcutaneous Q8H  . insulin aspart  0-9 Units Subcutaneous Q4H  . ipratropium-albuterol  3 mL Nebulization Q6H  . LORazepam  0.5 mg Oral Daily  . methylPREDNISolone (SOLU-MEDROL) injection  60 mg Intravenous TID  . pantoprazole  40 mg Oral Daily  . piperacillin-tazobactam (ZOSYN)  IV  3.375 g Intravenous Q8H  . sodium chloride flush  3 mL Intravenous Q12H  . vancomycin  750 mg Intravenous Q12H   Continuous Infusions:    LOS: 3 days     Gunther Zawadzki, Scheryl Marten, MD [facility] Pager [phone]  If 7PM-7AM, please contact night-coverage [email or web address] Password [facility] 03/12/2016, 3:40 PM

## 2016-03-12 NOTE — Progress Notes (Signed)
Pharmacy Antibiotic Note  [name] is a 82 y.o. female admitted on 03/09/2016 with pneumonia.  [facility]  Plan:  Zosyn 3.375gm IV q8h, EID  Vancomycin 1500mg  x 1 then 750mg  IV q12hrs  Check trough at steady state  Monitor labs, renal fxn, progress and c/s  Height: 5\' 4"  (162.6 cm) Weight: 172 lb 13.5 oz (78.4 kg) IBW/kg (Calculated) : 54.7  Temp (24hrs), Avg:97.8 F (36.6 C), Min:97 F (36.1 C), Max:99.1 F (37.3 C)   Recent Labs Lab 03/09/16 1640 03/10/16 0445 03/11/16 0623 03/12/16 0412  WBC 7.4 6.7 7.2 5.0  CREATININE 0.67 0.54 0.72 0.66    Estimated Creatinine Clearance: 54.9 mL/min (by C-G formula based on Cr of 0.66).    Allergies  Allergen Reactions  . Codeine Nausea Only  . Heparin Other (See Comments)    Causes internal bleeding     Antimicrobials this admission: 5/24 Vanc >>  5/24 Zosyn >>   Dose adjustments this admission:  Recent Results (from the past 240 hour(s))  Culture, body fluid-bottle     Status: None (Preliminary result)   Collection Time: 03/10/16  1:00 PM  Result Value Ref Range Status   Specimen Description FLUID LEFT PLEURAL COLLECTED BY DOCTOR BOLES  Final   Special Requests BOTTLES DRAWN AEROBIC AND ANAEROBIC 10CC EACH  Final   Culture NO GROWTH 2 DAYS  Final   Report Status PENDING  Incomplete  Gram stain     Status: None   Collection Time: 03/10/16  1:00 PM  Result Value Ref Range Status   Specimen Description FLUID LEFT PLEURAL COLLECTED BY DOCTOR BOLES  Final   Special Requests NONE  Final   Gram Stain   Final    CYTOSPIN SMEAR WBC PRESENT, PREDOMINANTLY MONONUCLEAR NO ORGANISMS SEEN [facility]    Report Status 03/10/2016 FINAL  Final  MRSA PCR Screening     Status: None   Collection Time: 03/11/16 11:42 PM  Result Value Ref Range Status   MRSA by PCR NEGATIVE NEGATIVE Final    Comment:        The GeneXpert MRSA Assay (FDA approved for  NASAL specimens only), is one component of a comprehensive MRSA colonization surveillance program. It is not intended to diagnose MRSA infection nor to guide or monitor treatment for MRSA infections.     Thank you for allowing pharmacy to be a part of this patient's care.  Valrie HartHall, [place] President A 03/12/2016 1:14 PM

## 2016-03-13 LAB — BASIC METABOLIC PANEL
ANION GAP: 8 (ref 5–15)
BUN: 29 mg/dL — AB (ref 6–20)
CALCIUM: 8.4 mg/dL — AB (ref 8.9–10.3)
CHLORIDE: 79 mmol/L — AB (ref 101–111)
CO2: 50 mmol/L — ABNORMAL HIGH (ref 22–32)
CREATININE: 0.76 mg/dL (ref 0.44–1.00)
GFR calc Af Amer: >60 mL/min (ref >60)
Glucose, Bld: 219 mg/dL — ABNORMAL HIGH (ref 65–99)
Potassium: 3.4 mmol/L — ABNORMAL LOW (ref 3.5–5.1)
Sodium: 137 mmol/L (ref 135–145)

## 2016-03-13 LAB — GLUCOSE, CAPILLARY
GLUCOSE-CAPILLARY: 262 mg/dL — AB (ref 65–99)
Glucose-Capillary: 213 mg/dL — ABNORMAL HIGH (ref 65–99)
Glucose-Capillary: 268 mg/dL — ABNORMAL HIGH (ref 65–99)
Glucose-Capillary: 290 mg/dL — ABNORMAL HIGH (ref 65–99)

## 2016-03-13 LAB — PATHOLOGIST SMEAR REVIEW

## 2016-03-13 MED ORDER — CARVEDILOL 12.5 MG PO TABS
12.5000 mg | ORAL_TABLET | Freq: Two times a day (BID) | ORAL | Status: DC
  Administered 2016-03-13 – 2016-03-19 (×10): 12.5 mg via ORAL
  Filled 2016-03-13 (×13): qty 1

## 2016-03-13 MED ORDER — POTASSIUM CHLORIDE CRYS ER 20 MEQ PO TBCR
40.0000 meq | EXTENDED_RELEASE_TABLET | Freq: Once | ORAL | Status: AC
  Administered 2016-03-13: 40 meq via ORAL
  Filled 2016-03-13: qty 2

## 2016-03-13 MED ORDER — INSULIN ASPART 100 UNIT/ML ~~LOC~~ SOLN
0.0000 [IU] | Freq: Three times a day (TID) | SUBCUTANEOUS | Status: DC
  Administered 2016-03-13: 8 [IU] via SUBCUTANEOUS
  Administered 2016-03-14: 5 [IU] via SUBCUTANEOUS
  Administered 2016-03-14: 8 [IU] via SUBCUTANEOUS
  Administered 2016-03-14: 11 [IU] via SUBCUTANEOUS
  Administered 2016-03-15 (×3): 8 [IU] via SUBCUTANEOUS

## 2016-03-13 MED ORDER — APIXABAN 5 MG PO TABS
5.0000 mg | ORAL_TABLET | Freq: Two times a day (BID) | ORAL | Status: DC
  Administered 2016-03-13 – 2016-03-20 (×15): 5 mg via ORAL
  Filled 2016-03-13 (×15): qty 1

## 2016-03-13 NOTE — Progress Notes (Signed)
Inpatient Diabetes Program Recommendations  AACE/ADA: New Consensus Statement on Inpatient Glycemic Control (2015)  Target Ranges:  Prepandial:   less than 140 mg/dL      Peak postprandial:   less than 180 mg/dL (1-2 hours)      Critically ill patients:  140 - 180 mg/dL   Results for [name], [name] (MRN [id number]) as of 03/13/2016 08:53  Ref. Range 03/12/2016 07:52 03/12/2016 11:12 03/12/2016 16:15 03/12/2016 21:21 03/13/2016 07:39  Glucose-Capillary Latest Ref Range: 65-99 mg/dL 130132 (H) 865181 (H) 784299 (H) 277 (H) 213 (H)   Review of Glycemic Control  Diabetes history: DM 2 Outpatient Diabetes medications: Diet Controlled Current orders for Inpatient glycemic control: Novolog Sensitive TID + HS scale  Inpatient Diabetes Program Recommendations:  Correction (SSI): IV Solumedrol 60 mg TID. Glucose ion the 200's this am. Please consider increasing correction to Novolog Moderate TID.  Thanks,  Kathy DeemShannon Nam Vossler RN, MSN, [facility] Center[facility] Inpatient Diabetes Coordinator Team Pager [phone] (8a-5p)

## 2016-03-13 NOTE — Progress Notes (Signed)
Primary cardiologist: Dr. Jonelle Sidle  Seen for followup: Atrial fibrillation, cardiomyopathy, pleural effusions  Subjective:    More alert and interactive today, eating breakfast. States that she feels much better in general. No palpitations or chest pain.  Objective:   Temp:  [97.3 F (36.3 C)-98 F (36.7 C)] 97.3 F (36.3 C) (05/25 0815) Pulse Rate:  [43-100] 43 (05/25 0800) Resp:  [5-31] 26 (05/25 0800) BP: (84-125)/(48-96) 110/52 mmHg (05/25 0800) SpO2:  [89 %-100 %] 89 % (05/25 0800) FiO2 (%):  [40 %] 40 % (05/24 0835) Last BM Date: 03/09/16  Filed Weights   03/10/16 0516 03/11/16 2300 03/12/16 0500  Weight: 166 lb 3.6 oz (75.4 kg) 172 lb 13.5 oz (78.4 kg) 172 lb 13.5 oz (78.4 kg)    Intake/Output Summary (Last 24 hours) at 03/13/16 0823 Last data filed at 03/13/16 0555  Gross per 24 hour  Intake   1250 ml  Output    650 ml  Net    600 ml    Telemetry: Atrial fibrillation in the 60s to 70s.  Exam:  General: Elderly woman in no acute distress.  HEENT: Wearing BiPAP.  Lungs: Diminished breath sounds throughout, decreased at the bases.  Cardiac: Diminished, irregularly irregular without gallop.  Abdomen: NABS.  Extremities: Ankle edema.  Lab Results:  Basic Metabolic Panel:  Recent Labs Lab 03/11/16 0623 03/12/16 0412 03/13/16 0423  NA 141 140 137  K 3.8 4.2 3.4*  CL 83* 81* 79*  CO2 >50* 49* 50*  GLUCOSE 127* 161* 219*  BUN 16 21* 29*  CREATININE 0.72 0.66 0.76  CALCIUM 8.6* 8.6* 8.4*    Liver Function Tests:  Recent Labs Lab 03/10/16 0625  AST 16  ALT 16  ALKPHOS 42  BILITOT 0.7  PROT 5.8*  ALBUMIN 3.4*    CBC:  Recent Labs Lab 03/10/16 0445 03/11/16 0623 03/12/16 0412  WBC 6.7 7.2 5.0  HGB 12.5 12.4 12.2  HCT 43.9 44.4 42.7  MCV 104.5* 105.7* 104.1*  PLT 152 145* 127*    Cardiac Enzymes:  Recent Labs Lab 03/09/16 1640  TROPONINI 0.03    Coagulation:  Recent Labs Lab 03/10/16 0625  INR 1.04      Echocardiogram 03/10/2016: Study Conclusions  - Left ventricle: The cavity size was normal. Wall thickness was  increased in a pattern of mild LVH. Systolic function was mildly  to moderately reduced. The estimated ejection fraction was in the  range of 40% to 45%. Diffuse hypokinesis. There is akinesis of  the basalinferior myocardium. The study is not technically  sufficient to allow evaluation of LV diastolic function. - Ventricular septum: Septal motion showed abnormal function and  dyssynergy. The contour showed diastolic flattening. - Aortic valve: Trileaflet; mildly calcified leaflets. Left  coronary cusp mobility was restricted. There was trivial  regurgitation. - Mitral valve: Calcified annulus. There was mild to moderate  regurgitation. - Left atrium: The atrium was moderately dilated. - Right ventricle: The cavity size was severely dilated. Systolic  function was severely reduced. - Right atrium: The atrium was moderately dilated. Central venous  pressure (est): 8 mm Hg. - Tricuspid valve: There was mild regurgitation. - Pulmonary arteries: PA peak pressure: 30 mm Hg (S). - Pericardium, extracardiac: A small pericardial effusion was  identified circumferential to the heart. There was a left pleural  effusion.  Impressions:  - Mild LVH with LVEF 40-45%, diffuse hypokinesis with akinesis of  the basal inferior wall. Indeterminate diastolic function in the  setting of atrial fibrillation. Septal dyssynergy suggesting IVCD  and diastolic septal flattening also noted. Moderate left atrial  enlargement. MAC with mild to moderate mitral regurgitation.  Sclerotic aortic valve with trivial aortic regurgitation.  Severely dilated right ventricle with severely reduced  contraction. Moderate right atrial enlargement. Mild tricuspid  regurgitation with PASP 30 mmHg. Left pleural effusion noted as  well as small pericardial effusion.   Medications:    Scheduled Medications: . antiseptic oral rinse  7 mL Mouth Rinse BID  . aspirin EC  81 mg Oral Daily  . carvedilol  6.25 mg Oral BID WC  . diltiazem  30 mg Oral Q6H  . furosemide  60 mg Intravenous Q8H  . heparin subcutaneous  5,000 Units Subcutaneous Q8H  . insulin aspart  0-5 Units Subcutaneous QHS  . insulin aspart  0-9 Units Subcutaneous TID WC  . ipratropium-albuterol  3 mL Nebulization Q6H  . LORazepam  0.5 mg Oral Daily  . methylPREDNISolone (SOLU-MEDROL) injection  60 mg Intravenous TID  . pantoprazole  40 mg Oral Daily  . piperacillin-tazobactam (ZOSYN)  IV  3.375 g Intravenous Q8H  . sodium chloride flush  3 mL Intravenous Q12H  . vancomycin  750 mg Intravenous Q12H    PRN Medications: acetaminophen **OR** [DISCONTINUED] acetaminophen, haloperidol lactate, ipratropium-albuterol, nitroGLYCERIN, [DISCONTINUED] ondansetron **OR** ondansetron (ZOFRAN) IV, polyethylene glycol   Assessment:   1. Persistent atrial fibrillation, heart rate significantly improved. She is currently on Coreg and diltiazem. CHADSVASC score is 5.  2. Bilateral pleural effusions, left greater than right in the setting of acute on chronic combined heart failure with LVEF 40-45%. She has been transitioned to higher dose IV Lasix, also status post left-sided thoracentesis with removal of 1000 cc fluid. Renal function has been stable.  3. Acute on chronic hypoxic/hypercarbic respiratory failure, improved with BiPAP. She is being followed by Dr. Juanetta GoslingHawkins.  4. Right lower lobe infiltrate concerning for pneumonia. Currently on broad-spectrum antibiotics.  5. Deconditioning.  6. Oxygen-dependent COPD.  7. CAD status post previous RCA interventions in 1999 2001. Troponin I levels normal. No active angina.   Plan/Discussion:    Discussed with patient and daughter. Will plan to stop diltiazem transition to Coreg alone at higher dose for heart rate control in light of her cardiomyopathy. Continue IV Lasix  for further diuresis. As blood pressure further stabilizes will consider adding low-dose ARB. Will also start Eliquis and stop aspirin.  Jonelle SidleSamuel G. Allis Quirarte, M.D., F.A.C.C.

## 2016-03-13 NOTE — Progress Notes (Signed)
PROGRESS NOTE    Kathy DELANCEY  Page:[id number] DOB: Apr 16, 1931 DOA: 03/09/2016 PCP: [name], MD   Brief Narrative:  82 y.o. female with a history of atrial fibrillation with a chads score of 5 on aspirin and not on anticoagulation secondary to history of GI bleed, coronary artery disease with stenting of the RCA and a history of inferior wall MI in 1999, hypertension, type 2 diabetes, hyperlipidemia.She was recently diagnosed with atrial fibrillation in the outpatient setting and was being worked up by cardiology.  He presented to the hospital with weakness and fatigue, found to be in CHF with bilateral pleural effusion, cardiology and pulmonary were consulted, she underwent 1 L thoracentesis and left lung fluid removal on 03/10/2016 which appears to be transudate due to CHF. However her echogram shows a depressed EF with wall motion of normality.   Assessment & Plan:   Principal Problem:   Acute on chronic respiratory failure with hypoxia (HCC) Active Problems:   Type 2 diabetes mellitus (HCC)   COPD (chronic obstructive pulmonary disease) (HCC)   Pleural effusion on left   Atrial fibrillation (HCC)   Status post thoracentesis   Pleural effusion, bilateral   Atrial fibrillation with RVR (HCC)   Confusion   SOB (shortness of breath)   1. Acute on chronic hypoxic respiratory failure due to bilateral pleural effusions -left more than right.  -Secondary to acute on chronic combined systolic and diastolic CHF EF 81%. -She is status post left-sided thoracentesis with 1 L transudative fluid removal on 03/10/2016 - Pt became developed hypercarbic failure, requiring transfer to SDU and bipap support on 5/23 -continue diuresis with Lasix -Clinically improved -Pulm recs for nocturnal bipap  2. New diagnosis of persistent atrial fibrillation.  -[place] vasc 2 score of close to 5.  -Currently on Cardizem and aspirin,  -Cardiology following, plans to initiate anticoagulation with  Eliquis   3. New diagnosis of combined systolic and diastolic CHF EF 19% and with wall motion of normality's.  -Cardiology following. -Continue diuresis as tolerated per above  4. Diet-controlled type 2 diabetes mellitus -Continue sliding scale coverage as needed -Glucose overall stable   DVT prophylaxis: Heparin subcutaneous Code Status: Full Family Communication: Patient in room, multiple family members at bedside Disposition Plan: Skilled nursing facility, timing uncertain   Consultants:   Pulmonary  Cardiology  Procedures:  TTE - with LVH, EF 45% and diffuse wall motion abnormalities  Left-sided thoracentesis with 1 L transudative fluid removed on 03/10/2016  Antimicrobials:  Anti-infectives    Start     Dose/Rate Route Frequency Ordered Stop   03/12/16 2100  vancomycin (VANCOCIN) IVPB 750 mg/150 ml premix     750 mg 150 mL/hr over 60 Minutes Intravenous Every 12 hours 03/12/16 1313     03/12/16 1000  vancomycin (VANCOCIN) 1,500 mg in sodium chloride 0.9 % 500 mL IVPB     1,500 mg 250 mL/hr over 120 Minutes Intravenous  Once 03/12/16 0802 03/12/16 1114   03/12/16 0900  piperacillin-tazobactam (ZOSYN) IVPB 3.375 g     3.375 g 12.5 mL/hr over 240 Minutes Intravenous Every 8 hours 03/12/16 0802            Subjective: Feels better today. No complaints  Objective: Filed Vitals:   03/13/16 1200 03/13/16 1209 03/13/16 1300 03/13/16 1338  BP: 112/42  108/59   Pulse: 87  90   Temp:  98.1 F (36.7 C)    TempSrc:  Oral    Resp: 32  26  Height:      Weight:      SpO2: 91%  94% 97%    Intake/Output Summary (Last 24 hours) at 03/13/16 1508 Last data filed at 03/13/16 1229  Gross per 24 hour  Intake    960 ml  Output      0 ml  Net    960 ml   Filed Weights   03/10/16 0516 03/11/16 2300 03/12/16 0500  Weight: 75.4 kg (166 lb 3.6 oz) 78.4 kg (172 lb 13.5 oz) 78.4 kg (172 lb 13.5 oz)    Examination:  General exam: Appears calm and comfortable, laying  in bed  Respiratory system: Clear to auscultation. Respiratory effort normal. Cardiovascular system: S1 & S2 heard, RRR.  Gastrointestinal system: Abdomen is nondistended, soft and nontender. No organomegaly or masses felt. Normal bowel sounds heard. Central nervous system: Alert and oriented. No focal neurological deficits. Extremities: Symmetric 5 x 5 power. Skin: No rashes, lesions  Psychiatry: Judgement and insight appear normal. Normal mood & affect    Data Reviewed: I have personally reviewed following labs and imaging studies  CBC:  Recent Labs Lab 03/09/16 1640 03/10/16 0445 03/11/16 0623 03/12/16 0412  WBC 7.4 6.7 7.2 5.0  HGB 14.1 12.5 12.4 12.2  HCT 48.7* 43.9 44.4 42.7  MCV 103.6* 104.5* 105.7* 104.1*  PLT 172 152 145* 127*   Basic Metabolic Panel:  Recent Labs Lab 03/09/16 1640 03/10/16 0445 03/11/16 0623 03/12/16 0412 03/13/16 0423  NA 142 142 141 140 137  K 3.5 3.5 3.8 4.2 3.4*  CL 87* 86* 83* 81* 79*  CO2 46* 48* >50* 49* 50*  GLUCOSE 206* 135* 127* 161* 219*  BUN 19 19 16  21* 29*  CREATININE 0.67 0.54 0.72 0.66 0.76  CALCIUM 9.0 8.5* 8.6* 8.6* 8.4*   GFR: Estimated Creatinine Clearance: 54.9 mL/min (by C-G formula based on Cr of 0.76). Liver Function Tests:  Recent Labs Lab 03/10/16 0625  AST 16  ALT 16  ALKPHOS 42  BILITOT 0.7  PROT 5.8*  ALBUMIN 3.4*   No results for input(s): LIPASE, AMYLASE in the last 168 hours. No results for input(s): AMMONIA in the last 168 hours. Coagulation Profile:  Recent Labs Lab 03/10/16 0625  INR 1.04   Cardiac Enzymes:  Recent Labs Lab 03/09/16 1640  TROPONINI 0.03   BNP (last 3 results) No results for input(s): PROBNP in the last 8760 hours. HbA1C: No results for input(s): HGBA1C in the last 72 hours. CBG:  Recent Labs Lab 03/12/16 1112 03/12/16 1615 03/12/16 2121 03/13/16 0739 03/13/16 1133  GLUCAP 181* 299* 277* 213* 268*   Lipid Profile: No results for input(s): CHOL, HDL,  LDLCALC, TRIG, CHOLHDL, LDLDIRECT in the last 72 hours. Thyroid Function Tests: No results for input(s): TSH, T4TOTAL, FREET4, T3FREE, THYROIDAB in the last 72 hours. Anemia Panel: No results for input(s): VITAMINB12, FOLATE, FERRITIN, TIBC, IRON, RETICCTPCT in the last 72 hours. Sepsis Labs: No results for input(s): PROCALCITON, LATICACIDVEN in the last 168 hours.  Recent Results (from the past 240 hour(s))  Culture, body fluid-bottle     Status: None (Preliminary result)   Collection Time: 03/10/16  1:00 PM  Result Value Ref Range Status   Specimen Description FLUID LEFT PLEURAL COLLECTED BY DOCTOR BOLES  Final   Special Requests BOTTLES DRAWN AEROBIC AND ANAEROBIC 10CC EACH  Final   Culture NO GROWTH 3 DAYS  Final   Report Status PENDING  Incomplete  Gram stain     Status: None  Collection Time: 03/10/16  1:00 PM  Result Value Ref Range Status   Specimen Description FLUID LEFT PLEURAL COLLECTED BY DOCTOR BOLES  Final   Special Requests NONE  Final   Gram Stain   Final    CYTOSPIN SMEAR WBC PRESENT, PREDOMINANTLY MONONUCLEAR NO ORGANISMS SEEN Performed at [facility]    Report Status 03/10/2016 FINAL  Final  MRSA PCR Screening     Status: None   Collection Time: 03/11/16 11:42 PM  Result Value Ref Range Status   MRSA by PCR NEGATIVE NEGATIVE Final    Comment:        The GeneXpert MRSA Assay (FDA approved for NASAL specimens only), is one component of a comprehensive MRSA colonization surveillance program. It is not intended to diagnose MRSA infection nor to guide or monitor treatment for MRSA infections.          Radiology Studies: Dg Chest Port 1 View  03/12/2016  CLINICAL DATA:  Respiratory failure EXAM: PORTABLE CHEST 1 VIEW COMPARISON:  Chest radiograph from one day prior. FINDINGS: Stable cardiomediastinal silhouette with mild cardiomegaly. No pneumothorax. Stable moderate left pleural effusion. Stable small right pleural effusion. Mild pulmonary edema  is not appreciably changed. Patchy left greater than right bilateral lung opacities appear stable. IMPRESSION: Stable chest radiograph with mild congestive heart failure, moderate left and small right pleural effusions and left greater than right bibasilar lung opacities favoring atelectasis. Electronically Signed   By: Delbert Phenix M.D.   On: 03/12/2016 10:20   Dg Chest Port 1 View  03/11/2016  CLINICAL DATA:  Shortness of breath and weakness EXAM: PORTABLE CHEST 1 VIEW COMPARISON:  03/10/2016 FINDINGS: Cardiac shadow is stable. Some slight increase in the degree of left pleural effusion is noted from the prior exam. Increased density is also noted in the right base likely related to early infiltrate. No pneumothorax is seen. No bony abnormality is noted. IMPRESSION: Slight increase in left pleural effusion with new right basilar infiltrate. Electronically Signed   By: Alcide Clever M.D.   On: 03/11/2016 20:07        Scheduled Meds: . antiseptic oral rinse  7 mL Mouth Rinse BID  . apixaban  5 mg Oral BID  . carvedilol  12.5 mg Oral BID WC  . furosemide  60 mg Intravenous Q8H  . insulin aspart  0-15 Units Subcutaneous TID WC  . ipratropium-albuterol  3 mL Nebulization Q6H  . LORazepam  0.5 mg Oral Daily  . methylPREDNISolone (SOLU-MEDROL) injection  60 mg Intravenous TID  . pantoprazole  40 mg Oral Daily  . piperacillin-tazobactam (ZOSYN)  IV  3.375 g Intravenous Q8H  . sodium chloride flush  3 mL Intravenous Q12H  . vancomycin  750 mg Intravenous Q12H   Continuous Infusions:    LOS: 4 days     Ayyub Krall, Scheryl Marten, MD [facility] Pager [phone]  If 7PM-7AM, please contact night-coverage [email or web address] Password [email or web address] 03/13/2016, 3:08 PM

## 2016-03-13 NOTE — Progress Notes (Signed)
Patient being transferred upstairs. Patient is alert and oriented and agreeable to transfer. Receiving nurse has been given report. All belongings transferred with patient. VSS: BP 108/59 mmHg  Pulse 90  Temp(Src) 98.1 F (36.7 C) (Oral)  Resp 26  Ht 5\' 4"  (1.626 m)  Wt 78.4 kg (172 lb 13.5 oz)  BMI 29.65 kg/m2  SpO2 97%

## 2016-03-13 NOTE — Progress Notes (Signed)
Subjective: She looks substantially better this morning. She is awake and alert. She is on nasal oxygen although she did use BiPAP overnight. She says she feels better.  Objective: Vital signs in last 24 hours: Temp:  [97.4 F (36.3 C)-99.1 F (37.3 C)] 97.7 F (36.5 C) (05/25 0300) Pulse Rate:  [65-100] 80 (05/25 0500) Resp:  [5-31] 24 (05/25 0000) BP: (84-125)/(48-96) 125/64 mmHg (05/25 0400) SpO2:  [89 %-100 %] 94 % (05/25 0728) FiO2 (%):  [40 %] 40 % (05/24 0835) Weight change:  Last BM Date: 03/09/16  Intake/Output from previous day: 05/24 0701 - 05/25 0700 In: 1250 [P.O.:900; IV Piggyback:350] Out: 650 [Urine:650]  PHYSICAL EXAM General appearance: alert, cooperative and no distress Resp: Her chest is significantly clearer Cardio: irregularly irregular rhythm GI: soft, non-tender; bowel sounds normal; no masses,  no organomegaly Extremities: She has some puffiness but no pitting  Lab Results:  Results for orders placed or performed during the hospital encounter of 03/09/16 (from the past 48 hour(s))  Glucose, capillary     Status: Abnormal   Collection Time: 03/11/16 11:23 AM  Result Value Ref Range   Glucose-Capillary 177 (H) 65 - 99 mg/dL   Comment 1 Notify RN    Comment 2 Document in Chart   Glucose, capillary     Status: Abnormal   Collection Time: 03/11/16  4:02 PM  Result Value Ref Range   Glucose-Capillary 178 (H) 65 - 99 mg/dL   Comment 1 Notify RN    Comment 2 Document in Chart   Blood gas, arterial     Status: Abnormal   Collection Time: 03/11/16  6:22 PM  Result Value Ref Range   O2 Content 4.0 L/min   Delivery systems NASAL CANNULA    pH, Arterial 7.222 (L) 7.350 - 7.450   pCO2 arterial  35.0 - 45.0 mmHg    CRITICAL RESULT CALLED TO, READ BACK BY AND VERIFIED WITH:    Comment: RACHEL  EVERETT RN BY JESSICA LASLEY, RRT ON 03/11/2016 AT 1829 CRITICAL RESULT CALLED TO, READ BACK BY AND VERIFIED WITH: ABOVE REPORTABLE RANGE RACHEL EVERTT RN BY  JESSICA LASLEY RRT ON 03/11/2016 AT 1 CRITICAL RESULT CALLED TO, READ BACK BY AND VERIFIED WITH: ABOVE REPORTABLE RANGE RAEPORTABLE RANGE R. EVERTT BY J. LASLEY ON 03/11/16 AT 1829    pO2, Arterial 63.2 (L) 80.0 - 100.0 mmHg   O2 Saturation 88.5 %   Collection site RIGHT RADIAL    Drawn by COLLECTED BY RT    Sample type ARTERIAL    Allens test (pass/fail) PASS PASS  Glucose, capillary     Status: Abnormal   Collection Time: 03/11/16  8:44 PM  Result Value Ref Range   Glucose-Capillary 109 (H) 65 - 99 mg/dL   Comment 1 Notify RN    Comment 2 Document in Chart   MRSA PCR Screening     Status: None   Collection Time: 03/11/16 11:42 PM  Result Value Ref Range   MRSA by PCR NEGATIVE NEGATIVE    Comment:        The GeneXpert MRSA Assay (FDA approved for NASAL specimens only), is one component of a comprehensive MRSA colonization surveillance program. It is not intended to diagnose MRSA infection nor to guide or monitor treatment for MRSA infections.   Glucose, capillary     Status: Abnormal   Collection Time: 03/11/16 11:58 PM  Result Value Ref Range   Glucose-Capillary 183 (H) 65 - 99 mg/dL   Comment 1 Notify  RN   Basic metabolic panel     Status: Abnormal   Collection Time: 03/12/16  4:12 AM  Result Value Ref Range   Sodium 140 135 - 145 mmol/L   Potassium 4.2 3.5 - 5.1 mmol/L   Chloride 81 (L) 101 - 111 mmol/L   CO2 49 (H) 22 - 32 mmol/L   Glucose, Bld 161 (H) 65 - 99 mg/dL   BUN 21 (H) 6 - 20 mg/dL   Creatinine, Ser 6.55 0.44 - 1.00 mg/dL   Calcium 8.6 (L) 8.9 - 10.3 mg/dL   GFR calc non Af Amer >60 >60 mL/min   GFR calc Af Amer >60 >60 mL/min    Comment: (NOTE) The eGFR has been calculated using the CKD EPI equation. This calculation has not been validated in all clinical situations. eGFR's persistently <60 mL/min signify possible Chronic Kidney Disease.    Anion gap 10 5 - 15  CBC     Status: Abnormal   Collection Time: 03/12/16  4:12 AM  Result Value Ref Range    WBC 5.0 4.0 - 10.5 K/uL   RBC 4.10 3.87 - 5.11 MIL/uL   Hemoglobin 12.2 12.0 - 15.0 g/dL   HCT 71.3 22.7 - 38.9 %   MCV 104.1 (H) 78.0 - 100.0 fL   MCH 29.8 26.0 - 34.0 pg   MCHC 28.6 (L) 30.0 - 36.0 g/dL   RDW 26.0 18.1 - 51.9 %   Platelets 127 (L) 150 - 400 K/uL  Glucose, capillary     Status: Abnormal   Collection Time: 03/12/16  4:19 AM  Result Value Ref Range   Glucose-Capillary 165 (H) 65 - 99 mg/dL   Comment 1 Notify RN   Glucose, capillary     Status: Abnormal   Collection Time: 03/12/16  7:52 AM  Result Value Ref Range   Glucose-Capillary 132 (H) 65 - 99 mg/dL   Comment 1 Notify RN    Comment 2 Document in Chart   Blood gas, arterial     Status: Abnormal   Collection Time: 03/12/16  9:15 AM  Result Value Ref Range   FIO2 0.40    Delivery systems BILEVEL POSITIVE AIRWAY PRESSURE    LHR 16 resp/min   Inspiratory PAP 10.0    Expiratory PAP 5.0    pH, Arterial 7.48 (H) 7.350 - 7.450   pCO2 arterial 68.9 (HH) 35.0 - 45.0 mmHg    Comment: CRITICAL RESULT CALLED TO, READ BACK BY AND VERIFIED WITH: RN NOTIFIED BY KIM GROENDAL RRT,RCP ON 03/12/2016 CRITICAL RESULT CALLED TO, READ BACK BY AND VERIFIED WITH: RN NOTIFIED AT 0940 BY KIM GROENDAL RRT,RCP ON 03/12/2016    pO2, Arterial 55.7 (L) 80.0 - 100.0 mmHg   Bicarbonate 47.4 (H) 20.0 - 24.0 mEq/L   TCO2 68.9 0 - 100 mmol/L   Acid-Base Excess 24.8 (H) 0.0 - 2.0 mmol/L   O2 Saturation 91.0 %   Collection site RIGHT RADIAL    Drawn by [id number]    Sample type ARTERIAL DRAW    Allens test (pass/fail) PASS PASS  Glucose, capillary     Status: Abnormal   Collection Time: 03/12/16 11:12 AM  Result Value Ref Range   Glucose-Capillary 181 (H) 65 - 99 mg/dL   Comment 1 Notify RN    Comment 2 Document in Chart   Glucose, capillary     Status: Abnormal   Collection Time: 03/12/16  4:15 PM  Result Value Ref Range   Glucose-Capillary 299 (H) 65 - 99  mg/dL   Comment 1 Notify RN    Comment 2 Document in Chart   Glucose,  capillary     Status: Abnormal   Collection Time: 03/12/16  9:21 PM  Result Value Ref Range   Glucose-Capillary 277 (H) 65 - 99 mg/dL   Comment 1 Notify RN    Comment 2 Document in Chart   Basic metabolic panel     Status: Abnormal   Collection Time: 03/13/16  4:23 AM  Result Value Ref Range   Sodium 137 135 - 145 mmol/L   Potassium 3.4 (L) 3.5 - 5.1 mmol/L    Comment: DELTA CHECK NOTED   Chloride 79 (L) 101 - 111 mmol/L   CO2 50 (H) 22 - 32 mmol/L   Glucose, Bld 219 (H) 65 - 99 mg/dL   BUN 29 (H) 6 - 20 mg/dL   Creatinine, Ser 0.76 0.44 - 1.00 mg/dL   Calcium 8.4 (L) 8.9 - 10.3 mg/dL   GFR calc non Af Amer >60 >60 mL/min   GFR calc Af Amer >60 >60 mL/min    Comment: (NOTE) The eGFR has been calculated using the CKD EPI equation. This calculation has not been validated in all clinical situations. eGFR's persistently <60 mL/min signify possible Chronic Kidney Disease.    Anion gap 8 5 - 15  Glucose, capillary     Status: Abnormal   Collection Time: 03/13/16  7:39 AM  Result Value Ref Range   Glucose-Capillary 213 (H) 65 - 99 mg/dL   Comment 1 Notify RN    Comment 2 Document in Chart     ABGS  Recent Labs  03/12/16 0915  PHART 7.48*  PO2ART 55.7*  TCO2 68.9  HCO3 47.4*   CULTURES Recent Results (from the past 240 hour(s))  Culture, body fluid-bottle     Status: None (Preliminary result)   Collection Time: 03/10/16  1:00 PM  Result Value Ref Range Status   Specimen Description FLUID LEFT PLEURAL COLLECTED BY DOCTOR BOLES  Final   Special Requests BOTTLES DRAWN AEROBIC AND ANAEROBIC 10CC EACH  Final   Culture NO GROWTH 2 DAYS  Final   Report Status PENDING  Incomplete  Gram stain     Status: None   Collection Time: 03/10/16  1:00 PM  Result Value Ref Range Status   Specimen Description FLUID LEFT PLEURAL COLLECTED BY DOCTOR BOLES  Final   Special Requests NONE  Final   Gram Stain   Final    CYTOSPIN SMEAR WBC PRESENT, PREDOMINANTLY MONONUCLEAR NO ORGANISMS  SEEN Performed at [facility]    Report Status 03/10/2016 FINAL  Final  MRSA PCR Screening     Status: None   Collection Time: 03/11/16 11:42 PM  Result Value Ref Range Status   MRSA by PCR NEGATIVE NEGATIVE Final    Comment:        The GeneXpert MRSA Assay (FDA approved for NASAL specimens only), is one component of a comprehensive MRSA colonization surveillance program. It is not intended to diagnose MRSA infection nor to guide or monitor treatment for MRSA infections.    Studies/Results: Dg Chest Port 1 View  03/12/2016  CLINICAL DATA:  Respiratory failure EXAM: PORTABLE CHEST 1 VIEW COMPARISON:  Chest radiograph from one day prior. FINDINGS: Stable cardiomediastinal silhouette with mild cardiomegaly. No pneumothorax. Stable moderate left pleural effusion. Stable small right pleural effusion. Mild pulmonary edema is not appreciably changed. Patchy left greater than right bilateral lung opacities appear stable. IMPRESSION: Stable chest radiograph with  mild congestive heart failure, moderate left and small right pleural effusions and left greater than right bibasilar lung opacities favoring atelectasis. Electronically Signed   By: Ilona Sorrel M.D.   On: 03/12/2016 10:20   Dg Chest Port 1 View  03/11/2016  CLINICAL DATA:  Shortness of breath and weakness EXAM: PORTABLE CHEST 1 VIEW COMPARISON:  03/10/2016 FINDINGS: Cardiac shadow is stable. Some slight increase in the degree of left pleural effusion is noted from the prior exam. Increased density is also noted in the right base likely related to early infiltrate. No pneumothorax is seen. No bony abnormality is noted. IMPRESSION: Slight increase in left pleural effusion with new right basilar infiltrate. Electronically Signed   By: Inez Catalina M.D.   On: 03/11/2016 20:07    Medications:  Prior to Admission:  Prescriptions prior to admission  Medication Sig Dispense Refill Last Dose  . acetaminophen (TYLENOL) 500 MG tablet  Take 1,000 mg by mouth every 6 (six) hours as needed for mild pain.   03/08/2016  . aspirin EC 81 MG tablet Take 81 mg by mouth daily.   03/09/2016  . diltiazem (CARDIZEM CD) 180 MG 24 hr capsule Take 1 capsule (180 mg total) by mouth daily. 90 capsule 3 03/09/2016  . furosemide (LASIX) 40 MG tablet Take 40 mg by mouth daily.     03/09/2016  . ipratropium-albuterol (DUONEB) 0.5-2.5 (3) MG/3ML SOLN Take 3 mLs by nebulization as needed.     Past Month  . LORazepam (ATIVAN) 1 MG tablet Take 0.5 mg by mouth daily.    03/09/2016  . omeprazole (PRILOSEC) 20 MG capsule Take 20 mg by mouth at bedtime.    03/08/2016  . atorvastatin (LIPITOR) 10 MG tablet Take 10 mg by mouth daily. Reported on 12/04/2015   Taking  . nitroGLYCERIN (NITROSTAT) 0.4 MG SL tablet Place 1 tablet (0.4 mg total) under the tongue every 5 (five) minutes as needed for chest pain. 25 tablet 3 Never  . NON FORMULARY Pt on 2.5 liters of O2 daily   Taking   Scheduled: . antiseptic oral rinse  7 mL Mouth Rinse BID  . aspirin EC  81 mg Oral Daily  . carvedilol  6.25 mg Oral BID WC  . diltiazem  30 mg Oral Q6H  . furosemide  60 mg Intravenous Q8H  . heparin subcutaneous  5,000 Units Subcutaneous Q8H  . insulin aspart  0-5 Units Subcutaneous QHS  . insulin aspart  0-9 Units Subcutaneous TID WC  . ipratropium-albuterol  3 mL Nebulization Q6H  . LORazepam  0.5 mg Oral Daily  . methylPREDNISolone (SOLU-MEDROL) injection  60 mg Intravenous TID  . pantoprazole  40 mg Oral Daily  . piperacillin-tazobactam (ZOSYN)  IV  3.375 g Intravenous Q8H  . sodium chloride flush  3 mL Intravenous Q12H  . vancomycin  750 mg Intravenous Q12H   Continuous:  [id number] **OR** [DISCONTINUED] acetaminophen, haloperidol lactate, ipratropium-albuterol, nitroGLYCERIN, [DISCONTINUED] ondansetron **OR** ondansetron (ZOFRAN) IV, polyethylene glycol  Assesment: She was admitted with acute on chronic mostly hypoxic respiratory failure. She later developed acute  on chronic hypoxic and hypercapnic respiratory failure requiring BiPAP. This appears to be related to COPD she has at baseline and to a large pleural effusion and acute on chronic heart failure. Her heart failure shows systolic dysfunction. She is improved today. She has infiltrates on chest x-ray which could be atelectasis but may be pneumonia and she is being treated for healthcare associated pneumonia area she has chronic atrial fibrillation. She  was very confused yesterday and that has improved Principal Problem:   Acute on chronic respiratory failure with hypoxia (HCC) Active Problems:   Type 2 diabetes mellitus (HCC)   COPD (chronic obstructive pulmonary disease) (HCC)   Pleural effusion on left   Atrial fibrillation (HCC)   Status post thoracentesis   Pleural effusion, bilateral   Atrial fibrillation with RVR (HCC)   Confusion   SOB (shortness of breath)    Plan: Continue treatments. I think she will need to have BiPAP still at night. I would continue treatment for pneumonia for now but we probably can transition to oral meds soon. Plans are for her to go to skilled care facility for rehabilitation which I think is appropriate  I will be out of town starting tomorrow 03/14/2016 and will return on 03/18/2016    LOS: 4 days   Nial Hawe L 03/13/2016, 8:10 AM

## 2016-03-14 DIAGNOSIS — I2510 Atherosclerotic heart disease of native coronary artery without angina pectoris: Secondary | ICD-10-CM

## 2016-03-14 LAB — GLUCOSE, CAPILLARY
GLUCOSE-CAPILLARY: 220 mg/dL — AB (ref 65–99)
GLUCOSE-CAPILLARY: 244 mg/dL — AB (ref 65–99)
Glucose-Capillary: 254 mg/dL — ABNORMAL HIGH (ref 65–99)
Glucose-Capillary: 304 mg/dL — ABNORMAL HIGH (ref 65–99)

## 2016-03-14 LAB — BASIC METABOLIC PANEL
Anion gap: 8 (ref 5–15)
BUN: 36 mg/dL — AB (ref 6–20)
CALCIUM: 8.3 mg/dL — AB (ref 8.9–10.3)
CO2: 49 mmol/L — ABNORMAL HIGH (ref 22–32)
CREATININE: 0.75 mg/dL (ref 0.44–1.00)
Chloride: 81 mmol/L — ABNORMAL LOW (ref 101–111)
Glucose, Bld: 230 mg/dL — ABNORMAL HIGH (ref 65–99)
Potassium: 3.4 mmol/L — ABNORMAL LOW (ref 3.5–5.1)
SODIUM: 138 mmol/L (ref 135–145)

## 2016-03-14 LAB — MAGNESIUM: Magnesium: 1.9 mg/dL (ref 1.7–2.4)

## 2016-03-14 MED ORDER — DIGOXIN 125 MCG PO TABS
0.1250 mg | ORAL_TABLET | Freq: Every day | ORAL | Status: DC
  Administered 2016-03-14 – 2016-03-20 (×7): 0.125 mg via ORAL
  Filled 2016-03-14 (×7): qty 1

## 2016-03-14 MED ORDER — POTASSIUM CHLORIDE CRYS ER 20 MEQ PO TBCR
40.0000 meq | EXTENDED_RELEASE_TABLET | Freq: Once | ORAL | Status: AC
  Administered 2016-03-14: 40 meq via ORAL
  Filled 2016-03-14: qty 4

## 2016-03-14 NOTE — Progress Notes (Signed)
PROGRESS NOTE    [name]  ZOX:[id number] DOB: 1931/02/14 DOA: 03/09/2016 PCP: [name], MD   Brief Narrative:  82 y.o. female with a history of atrial fibrillation with a chads score of 5 on aspirin and not on anticoagulation secondary to history of GI bleed, coronary artery disease with stenting of the RCA and a history of inferior wall MI in 1999, hypertension, type 2 diabetes, hyperlipidemia.She was recently diagnosed with atrial fibrillation in the outpatient setting and was being worked up by cardiology.  He presented to the hospital with weakness and fatigue, found to be in CHF with bilateral pleural effusion, cardiology and pulmonary were consulted, she underwent 1 L thoracentesis and left lung fluid removal on 03/10/2016 which appears to be transudate due to CHF. However her echogram shows a depressed EF with wall motion of normality.   Assessment & Plan:   Principal Problem:   Acute on chronic respiratory failure with hypoxia (HCC) Active Problems:   Type 2 diabetes mellitus (HCC)   COPD (chronic obstructive pulmonary disease) (HCC)   Pleural effusion on left   Atrial fibrillation (HCC)   Status post thoracentesis   Pleural effusion, bilateral   Atrial fibrillation with RVR (HCC)   Confusion   SOB (shortness of breath)   1. Acute on chronic hypoxic respiratory failure due to bilateral pleural effusions -left more than right.  -Secondary to acute on chronic combined systolic and diastolic CHF EF 81%. -She is status post left-sided thoracentesis with 1 L transudative fluid removal on 03/10/2016 - Pt became developed hypercarbic failure, requiring transfer to SDU and bipap support on 5/23 -continue diuresis with Lasix -Clinically improved -Continue to tolerated.  2. New diagnosis of persistent atrial fibrillation.  -[place] vasc 2 score of close to 5.  -Currently on Cardizem and aspirin,  -Cardiology following, plans to initiate anticoagulation with Eliquis    3. New diagnosis of combined systolic and diastolic CHF EF 19% and with wall motion of normality  -Cardiology following. -Continue diuresis as tolerated per above -We'll get repeat chest x-ray in morning  4. Diet-controlled type 2 diabetes mellitus -Continue sliding scale coverage as needed -Glucose overall stable   DVT prophylaxis: Heparin subcutaneous Code Status: Full Family Communication: Patient in room, family not at bedside Disposition Plan: Skilled nursing facility, timing uncertain   Consultants:   Pulmonary  Cardiology  Procedures:  TTE - with LVH, EF 45% and diffuse wall motion abnormalities  Left-sided thoracentesis with 1 L transudative fluid removed on 03/10/2016  Antimicrobials:  Anti-infectives    Start     Dose/Rate Route Frequency Ordered Stop   03/12/16 2100  vancomycin (VANCOCIN) IVPB 750 mg/150 ml premix     750 mg 150 mL/hr over 60 Minutes Intravenous Every 12 hours 03/12/16 1313     03/12/16 1000  vancomycin (VANCOCIN) 1,500 mg in sodium chloride 0.9 % 500 mL IVPB     1,500 mg 250 mL/hr over 120 Minutes Intravenous  Once 03/12/16 0802 03/12/16 1114   03/12/16 0900  piperacillin-tazobactam (ZOSYN) IVPB 3.375 g     3.375 g 12.5 mL/hr over 240 Minutes Intravenous Every 8 hours 03/12/16 0802            Subjective: Feels better today. No complaints  Objective: Filed Vitals:   03/14/16 0745 03/14/16 1336 03/14/16 1347 03/14/16 1634  BP:   112/64   Pulse:   90 128  Temp:   98 F (36.7 C)   TempSrc:      Resp:  20   Height:      Weight:      SpO2: 91% 94% 94% 89%    Intake/Output Summary (Last 24 hours) at 03/14/16 1645 Last data filed at 03/14/16 1300  Gross per 24 hour  Intake    890 ml  Output   3100 ml  Net  -2210 ml   Filed Weights   03/10/16 0516 03/11/16 2300 03/12/16 0500  Weight: 75.4 kg (166 lb 3.6 oz) 78.4 kg (172 lb 13.5 oz) 78.4 kg (172 lb 13.5 oz)    Examination:  General exam: Appears calm and  comfortable Respiratory system: Clear to auscultation. Respiratory effort normal, No wheezing Cardiovascular system: S1 & S2 heard, RRR.  Gastrointestinal system: Abdomen is nondistended, soft and nontender. No organomegaly or masses felt. Normal bowel sounds heard. Central nervous system: Alert and oriented. No focal neurological deficits. Extremities: Symmetric 5 x 5 power. Skin: No rashes, lesions  Psychiatry: Judgement and insight appear normal. mood & affect normal    Data Reviewed: I have personally reviewed following labs and imaging studies  CBC:  Recent Labs Lab 03/09/16 1640 03/10/16 0445 03/11/16 0623 03/12/16 0412  WBC 7.4 6.7 7.2 5.0  HGB 14.1 12.5 12.4 12.2  HCT 48.7* 43.9 44.4 42.7  MCV 103.6* 104.5* 105.7* 104.1*  PLT 172 152 145* 127*   Basic Metabolic Panel:  Recent Labs Lab 03/10/16 0445 03/11/16 0623 03/12/16 0412 03/13/16 0423 03/14/16 0518  NA 142 141 140 137 138  K 3.5 3.8 4.2 3.4* 3.4*  CL 86* 83* 81* 79* 81*  CO2 48* >50* 49* 50* 49*  GLUCOSE 135* 127* 161* 219* 230*  BUN 19 16 21* 29* 36*  CREATININE 0.54 0.72 0.66 0.76 0.75  CALCIUM 8.5* 8.6* 8.6* 8.4* 8.3*  MG  --   --   --   --  1.9   GFR: Estimated Creatinine Clearance: 54.9 mL/min (by C-G formula based on Cr of 0.75). Liver Function Tests:  Recent Labs Lab 03/10/16 0625  AST 16  ALT 16  ALKPHOS 42  BILITOT 0.7  PROT 5.8*  ALBUMIN 3.4*   No results for input(s): LIPASE, AMYLASE in the last 168 hours. No results for input(s): AMMONIA in the last 168 hours. Coagulation Profile:  Recent Labs Lab 03/10/16 0625  INR 1.04   Cardiac Enzymes:  Recent Labs Lab 03/09/16 1640  TROPONINI 0.03   BNP (last 3 results) No results for input(s): PROBNP in the last 8760 hours. HbA1C: No results for input(s): HGBA1C in the last 72 hours. CBG:  Recent Labs Lab 03/13/16 1708 03/13/16 2104 03/14/16 0810 03/14/16 1141 03/14/16 1603  GLUCAP 262* 290* 254* 304* 220*    Lipid Profile: No results for input(s): CHOL, HDL, LDLCALC, TRIG, CHOLHDL, LDLDIRECT in the last 72 hours. Thyroid Function Tests: No results for input(s): TSH, T4TOTAL, FREET4, T3FREE, THYROIDAB in the last 72 hours. Anemia Panel: No results for input(s): VITAMINB12, FOLATE, FERRITIN, TIBC, IRON, RETICCTPCT in the last 72 hours. Sepsis Labs: No results for input(s): PROCALCITON, LATICACIDVEN in the last 168 hours.  Recent Results (from the past 240 hour(s))  Culture, body fluid-bottle     Status: None (Preliminary result)   Collection Time: 03/10/16  1:00 PM  Result Value Ref Range Status   Specimen Description FLUID LEFT PLEURAL COLLECTED BY DOCTOR BOLES  Final   Special Requests BOTTLES DRAWN AEROBIC AND ANAEROBIC 10CC EACH  Final   Culture NO GROWTH 4 DAYS  Final   Report Status PENDING  Incomplete  Gram stain     Status: None   Collection Time: 03/10/16  1:00 PM  Result Value Ref Range Status   Specimen Description FLUID LEFT PLEURAL COLLECTED BY DOCTOR BOLES  Final   Special Requests NONE  Final   Gram Stain   Final    CYTOSPIN SMEAR WBC PRESENT, PREDOMINANTLY MONONUCLEAR NO ORGANISMS SEEN Performed at [facility]    Report Status 03/10/2016 FINAL  Final  MRSA PCR Screening     Status: None   Collection Time: 03/11/16 11:42 PM  Result Value Ref Range Status   MRSA by PCR NEGATIVE NEGATIVE Final    Comment:        The GeneXpert MRSA Assay (FDA approved for NASAL specimens only), is one component of a comprehensive MRSA colonization surveillance program. It is not intended to diagnose MRSA infection nor to guide or monitor treatment for MRSA infections.          Radiology Studies: No results found.      Scheduled Meds: . antiseptic oral rinse  7 mL Mouth Rinse BID  . apixaban  5 mg Oral BID  . carvedilol  12.5 mg Oral BID WC  . digoxin  0.125 mg Oral Daily  . furosemide  60 mg Intravenous Q8H  . insulin aspart  0-15 Units Subcutaneous TID  WC  . ipratropium-albuterol  3 mL Nebulization Q6H  . LORazepam  0.5 mg Oral Daily  . methylPREDNISolone (SOLU-MEDROL) injection  60 mg Intravenous TID  . pantoprazole  40 mg Oral Daily  . piperacillin-tazobactam (ZOSYN)  IV  3.375 g Intravenous Q8H  . sodium chloride flush  3 mL Intravenous Q12H  . vancomycin  750 mg Intravenous Q12H   Continuous Infusions:    LOS: 5 days     Nathon Stefanski, Scheryl MartenSTEPHEN K, MD [facility] Pager [phone]  If 7PM-7AM, please contact night-coverage [email or web address] Password [email or web address] 03/14/2016, 4:45 PM

## 2016-03-14 NOTE — Care Management Important Message (Signed)
Important Message  Patient Details  Name: [name] MRN: [id number] Date of Birth: 05/27/1931   Medicare Important Message Given:  Yes    Adonis HugueninBerkhead, Davyd Podgorski L, RN 03/14/2016, 8:53 AM

## 2016-03-14 NOTE — Clinical Social Work Note (Signed)
CSW updated PNC on pt. Possible d/c this weekend per MD. Facility notified and agreeable.   Derenda FennelKara Ertha Nabor, LCSW [phone]

## 2016-03-14 NOTE — Progress Notes (Signed)
Results for Simone CuriaOWELL, Auda L (MRN [id number]) as of 03/14/2016 12:01  Ref. Range 03/13/2016 11:33 03/13/2016 17:08 03/13/2016 21:04 03/14/2016 08:10 03/14/2016 11:41  Glucose-Capillary Latest Ref Range: 65-99 mg/dL 147268 (H) 829262 (H) 562290 (H) 254 (H) 304 (H)  Noted that blood sugars greater than 180 mg/dl. Recommend continuing Novolog MODERATE correction scale TID and adding Novolog 3-4 units TID as meal coverage if patient eats at least 50% of meals.  Will continue to monitor blood sugars while in the hospital. Smith MinceKendra Hamid Brookens RN BSN CDE

## 2016-03-14 NOTE — Progress Notes (Signed)
Primary cardiologist: Dr. Jonelle SidleSamuel G. Jaquavis Felmlee  Seen for followup: Atrial fibrillation, cardiomyopathy, pleural effusions  Subjective:    Transferred out the floor. States that she slept well. Good appetite. Breathing improved in general. Still weak.  Objective:   Temp:  [98.1 F (36.7 C)-98.3 F (36.8 C)] 98.3 F (36.8 C) (05/25 2105) Pulse Rate:  [81-101] 98 (05/25 2105) Resp:  [17-32] 22 (05/25 2105) BP: (100-118)/(42-66) 109/64 mmHg (05/25 2105) SpO2:  [91 %-97 %] 91 % (05/26 0745) FiO2 (%):  [40 %] 40 % (05/26 0105) Last BM Date: 03/09/16  Filed Weights   03/10/16 0516 03/11/16 2300 03/12/16 0500  Weight: 166 lb 3.6 oz (75.4 kg) 172 lb 13.5 oz (78.4 kg) 172 lb 13.5 oz (78.4 kg)    Intake/Output Summary (Last 24 hours) at 03/14/16 0947 Last data filed at 03/14/16 0851  Gross per 24 hour  Intake    460 ml  Output   4300 ml  Net  -3840 ml    Telemetry: Atrial fibrillation in the 90-100 range.  Exam:  General: Elderly woman in no acute distress.  Lungs: Diminished breath sounds throughout, decreased at the bases.  Cardiac: Diminished, irregularly irregular without gallop.  Abdomen: NABS.  Extremities: Ankle edema.  Lab Results:  Basic Metabolic Panel:  Recent Labs Lab 03/12/16 0412 03/13/16 0423 03/14/16 0518  NA 140 137 138  K 4.2 3.4* 3.4*  CL 81* 79* 81*  CO2 49* 50* 49*  GLUCOSE 161* 219* 230*  BUN 21* 29* 36*  CREATININE 0.66 0.76 0.75  CALCIUM 8.6* 8.4* 8.3*  MG  --   --  1.9    Liver Function Tests:  Recent Labs Lab 03/10/16 0625  AST 16  ALT 16  ALKPHOS 42  BILITOT 0.7  PROT 5.8*  ALBUMIN 3.4*    CBC:  Recent Labs Lab 03/10/16 0445 03/11/16 0623 03/12/16 0412  WBC 6.7 7.2 5.0  HGB 12.5 12.4 12.2  HCT 43.9 44.4 42.7  MCV 104.5* 105.7* 104.1*  PLT 152 145* 127*    Cardiac Enzymes:  Recent Labs Lab 03/09/16 1640  TROPONINI 0.03    Coagulation:  Recent Labs Lab 03/10/16 0625  INR 1.04     Echocardiogram 03/10/2016: Study Conclusions  - Left ventricle: The cavity size was normal. Wall thickness was  increased in a pattern of mild LVH. Systolic function was mildly  to moderately reduced. The estimated ejection fraction was in the  range of 40% to 45%. Diffuse hypokinesis. There is akinesis of  the basalinferior myocardium. The study is not technically  sufficient to allow evaluation of LV diastolic function. - Ventricular septum: Septal motion showed abnormal function and  dyssynergy. The contour showed diastolic flattening. - Aortic valve: Trileaflet; mildly calcified leaflets. Left  coronary cusp mobility was restricted. There was trivial  regurgitation. - Mitral valve: Calcified annulus. There was mild to moderate  regurgitation. - Left atrium: The atrium was moderately dilated. - Right ventricle: The cavity size was severely dilated. Systolic  function was severely reduced. - Right atrium: The atrium was moderately dilated. Central venous  pressure (est): 8 mm Hg. - Tricuspid valve: There was mild regurgitation. - Pulmonary arteries: PA peak pressure: 30 mm Hg (S). - Pericardium, extracardiac: A small pericardial effusion was  identified circumferential to the heart. There was a left pleural  effusion.  Impressions:  - Mild LVH with LVEF 40-45%, diffuse hypokinesis with akinesis of  the basal inferior wall. Indeterminate diastolic function in the  setting of  atrial fibrillation. Septal dyssynergy suggesting IVCD  and diastolic septal flattening also noted. Moderate left atrial  enlargement. MAC with mild to moderate mitral regurgitation.  Sclerotic aortic valve with trivial aortic regurgitation.  Severely dilated right ventricle with severely reduced  contraction. Moderate right atrial enlargement. Mild tricuspid  regurgitation with PASP 30 mmHg. Left pleural effusion noted as  well as small pericardial effusion.   Medications:    Scheduled Medications: . antiseptic oral rinse  7 mL Mouth Rinse BID  . apixaban  5 mg Oral BID  . carvedilol  12.5 mg Oral BID WC  . furosemide  60 mg Intravenous Q8H  . insulin aspart  0-15 Units Subcutaneous TID WC  . ipratropium-albuterol  3 mL Nebulization Q6H  . LORazepam  0.5 mg Oral Daily  . methylPREDNISolone (SOLU-MEDROL) injection  60 mg Intravenous TID  . pantoprazole  40 mg Oral Daily  . piperacillin-tazobactam (ZOSYN)  IV  3.375 g Intravenous Q8H  . sodium chloride flush  3 mL Intravenous Q12H  . vancomycin  750 mg Intravenous Q12H    PRN Medications: acetaminophen **OR** [DISCONTINUED] acetaminophen, haloperidol lactate, ipratropium-albuterol, nitroGLYCERIN, [DISCONTINUED] ondansetron **OR** ondansetron (ZOFRAN) IV, polyethylene glycol   Assessment:   1. Persistent atrial fibrillation, heart rate improved. She is currently on Coreg and Eliquis. CHADSVASC score is 5.  2. Bilateral pleural effusions, left greater than right in the setting of acute on chronic combined heart failure with LVEF 40-45%. She has been transitioned to higher dose IV Lasix, also status post left-sided thoracentesis with removal of 1000 cc fluid. Renal function has been stable. Additional 2500 cc out last 24 hours.  3. Acute on chronic hypoxic/hypercarbic respiratory failure, improved with BiPAP. She is being followed by Dr. Juanetta Gosling.  4. Right lower lobe infiltrate concerning for pneumonia. Currently on broad-spectrum antibiotics.  5. Deconditioning. PT has assessed. Being considered for the [facility].  6. Oxygen-dependent COPD.  7. CAD status post previous RCA interventions in 1999 2001. Troponin I levels normal. No active angina.   Plan/Discussion:    Discussed with patient and daughter. Continue Coreg, add Lanoxin for additional heart rate control and also with cardiomyopathy. Continue IV Lasix with good diuresis and stable renal function. Would improve volume status further prior to  changing to oral diuretic. Consider repeat chest x-ray over the weekend. Tolerating Eliquis so far. Would heme check stools and repeat CBC a.m.  Jonelle Sidle, M.D., F.A.C.C.

## 2016-03-14 NOTE — Discharge Instructions (Addendum)
Information on my medicine - ELIQUIS® (apixaban) ° °This medication education was reviewed with me or my healthcare representative as part of my discharge preparation.  The pharmacist that spoke with me during my hospital stay was:  Aaryn Parrilla Poteet, RPH ° °Why was Eliquis® prescribed for you? °Eliquis® was prescribed for you to reduce the risk of a blood clot forming that can cause a stroke if you have a medical condition called atrial fibrillation (a type of irregular heartbeat). ° °What do You need to know about Eliquis® ? °Take your Eliquis® TWICE DAILY - one tablet in the morning and one tablet in the evening with or without food. If you have difficulty swallowing the tablet whole please discuss with your pharmacist how to take the medication safely. ° °Take Eliquis® exactly as prescribed by your doctor and DO NOT stop taking Eliquis® without talking to the doctor who prescribed the medication.  Stopping may increase your risk of developing a stroke.  Refill your prescription before you run out. ° °After discharge, you should have regular check-up appointments with your healthcare provider that is prescribing your Eliquis®.  In the future your dose may need to be changed if your kidney function or weight changes by a significant amount or as you get older. ° °What do you do if you miss a dose? °If you miss a dose, take it as soon as you remember on the same day and resume taking twice daily.  Do not take more than one dose of ELIQUIS at the same time to make up a missed dose. ° °Important Safety Information °A possible side effect of Eliquis® is bleeding. You should call your healthcare provider right away if you experience any of the following: °  Bleeding from an injury or your nose that does not stop. °  Unusual colored urine (red or dark brown) or unusual colored stools (red or black). °  Unusual bruising for unknown reasons. °  A serious fall or if you hit your head (even if there is no bleeding). ° °Some  medicines may interact with Eliquis® and might increase your risk of bleeding or clotting while on Eliquis®. To help avoid this, consult your healthcare provider or pharmacist prior to using any new prescription or non-prescription medications, including herbals, vitamins, non-steroidal anti-inflammatory drugs (NSAIDs) and supplements. ° °This website has more information on Eliquis® (apixaban): http://www.eliquis.com/eliquis/home °

## 2016-03-14 NOTE — Progress Notes (Signed)
Physical Therapy Treatment Patient Details Name: Kathy CuriaVirginia L Blaze MRN: [id number] DOB: 08/22/1931 Today's Date: 03/14/2016    History of Present Illness [age] yo F admitted with worsening SOB over the last 3-4 weeks.  DX: CHF with B pleural effusions.  S/P thoracentesis 5/22 with removal of 1L.  Echo with depressed EF of 45%.  3/22 - pt had some altered mental status with ABG was 7.22/109/ 63, and CO2>40 - requiring BiPAP, and therefore transferred to ICU.  PMH: A-fib, not on anticoagulation due to GI bleed, CAD with RCA stenting, MI, HTN, DM2, asthma, COPD, cholecystectomy, exploratory laparotomy, partial colectomy.     PT Comments    Pt tolerating treatment session well, motivated and able to complete entire PT sesssion as planned. Pt continues to make progress toward goals as evidenced by ability to repeatedly perform transfers with min A and tolerate great distance AMB. Pt's greatest limitation continues to be generalized weakness, which continues to limit ability to perform functional mobility at baseline function. Patient presenting with impairment of strength, O2 perfusion, balance, and activity tolerance, limiting ability to perform ADL and mobility tasks at  baseline level of function. Patient will benefit from skilled intervention to address the above impairments and limitations, in order to restore to prior level of function, improve patient safety upon discharge, and to decrease caregiver burden.    Follow Up Recommendations  SNF     Equipment Recommendations  None recommended by PT    Recommendations for Other Services       Precautions / Restrictions Precautions Precautions: Fall    Mobility  Bed Mobility Overal bed mobility: Needs Assistance Bed Mobility: Supine to Sit;Sit to Supine     Supine to sit: Min assist;HOB elevated Sit to supine: Max assist;HOB elevated   General bed mobility comments: Max A for bridging to assiist with scooting toward HOB.    Transfers Overall transfer level: Needs assistance Equipment used: Rolling walker (2 wheeled);1 person hand held assist Transfers: Sit to/from Stand Sit to Stand: Min assist;From elevated surface (performed 5x, dependent style for exercise. 2x c RW during AMB.)            Ambulation/Gait   Ambulation Distance (Feet): 24 Feet (946ft AMB, 646ft retro AMB, 596ft AMB, 626ft retro ) Assistive device: Rolling walker (2 wheeled)     Gait velocity interpretation: <1.8 ft/sec, indicative of risk for recurrent falls General Gait Details: Very slow adn weak, step to gait. Pt reports her knees to be giving out and asks to sit.    Stairs            Wheelchair Mobility    Modified Rankin (Stroke Patients Only)       Balance                                    Cognition Arousal/Alertness: Awake/alert (appears fatgiued.) Behavior During Therapy: WFL for tasks assessed/performed Overall Cognitive Status: Within Functional Limits for tasks assessed                      Exercises Other Exercises Other Exercises: Seated LAQ bilat 1x10, trunk weakness noted.  Other Exercises: Seated hip flexion 1x10 bilat with modA for full range, trunk weakness noted.    General Comments        Pertinent Vitals/Pain Pain Assessment: No/denies pain    Home Living  Prior Function            PT Goals (current goals can now be found in the care plan section) Acute Rehab PT Goals Patient Stated Goal: Pt wants to get her strength back PT Goal Formulation: With patient/family Time For Goal Achievement: 03/18/16 Potential to Achieve Goals: Good Progress towards PT goals: Progressing toward goals    Frequency  Min 5X/week    PT Plan Current plan remains appropriate    Co-evaluation             End of Session Equipment Utilized During Treatment: Gait belt;Oxygen Activity Tolerance: Patient limited by fatigue;Patient tolerated  treatment well Patient left: in chair;with family/visitor present;with SCD's reapplied     Time: 1610-9604 PT Time Calculation (min) (ACUTE ONLY): 28 min  Charges:  $Gait Training: 8-22 mins $Therapeutic Activity: 8-22 mins                    G Codes:     4:42 PM, Mar 17, 2016 Rosamaria Lints, PT, DPT PRN Physical Therapist - [facility] [employer] License # [id number] [phone] ([phone] (mobile)

## 2016-03-14 NOTE — Progress Notes (Signed)
Placed on BIPAP 10/6 , increased EPAP to 6 from 5.

## 2016-03-15 ENCOUNTER — Inpatient Hospital Stay (HOSPITAL_COMMUNITY): Payer: Medicare Other

## 2016-03-15 LAB — GLUCOSE, CAPILLARY
GLUCOSE-CAPILLARY: 263 mg/dL — AB (ref 65–99)
GLUCOSE-CAPILLARY: 224 mg/dL — AB (ref 65–99)
Glucose-Capillary: 256 mg/dL — ABNORMAL HIGH (ref 65–99)
Glucose-Capillary: 283 mg/dL — ABNORMAL HIGH (ref 65–99)

## 2016-03-15 LAB — CBC
HEMATOCRIT: 35.6 % — AB (ref 36.0–46.0)
HEMOGLOBIN: 10.9 g/dL — AB (ref 12.0–15.0)
MCH: 30.1 pg (ref 26.0–34.0)
MCHC: 30.6 g/dL (ref 30.0–36.0)
MCV: 98.3 fL (ref 78.0–100.0)
Platelets: 119 10*3/uL — ABNORMAL LOW (ref 150–400)
RBC: 3.62 MIL/uL — ABNORMAL LOW (ref 3.87–5.11)
RDW: 14.5 % (ref 11.5–15.5)
WBC: 4.1 10*3/uL (ref 4.0–10.5)

## 2016-03-15 LAB — BASIC METABOLIC PANEL
ANION GAP: 8 (ref 5–15)
BUN: 45 mg/dL — ABNORMAL HIGH (ref 6–20)
CHLORIDE: 81 mmol/L — AB (ref 101–111)
CO2: 50 mmol/L — AB (ref 22–32)
Calcium: 8.4 mg/dL — ABNORMAL LOW (ref 8.9–10.3)
Creatinine, Ser: 0.79 mg/dL (ref 0.44–1.00)
GFR calc Af Amer: >60 mL/min (ref >60)
GLUCOSE: 251 mg/dL — AB (ref 65–99)
POTASSIUM: 3.7 mmol/L (ref 3.5–5.1)
Sodium: 139 mmol/L (ref 135–145)

## 2016-03-15 LAB — CBC AND DIFFERENTIAL
HEMATOCRIT: 36 % (ref 36–46)
HEMOGLOBIN: 10.9 g/dL — AB (ref 12.0–16.0)
PLATELETS: 119 10*3/uL — AB (ref 150–399)
WBC: 4.1 10*3/mL

## 2016-03-15 LAB — CULTURE, BODY FLUID W GRAM STAIN -BOTTLE: Culture: NO GROWTH

## 2016-03-15 LAB — CULTURE, BODY FLUID-BOTTLE

## 2016-03-15 MED ORDER — IPRATROPIUM-ALBUTEROL 0.5-2.5 (3) MG/3ML IN SOLN
3.0000 mL | Freq: Three times a day (TID) | RESPIRATORY_TRACT | Status: DC
  Administered 2016-03-15 – 2016-03-16 (×3): 3 mL via RESPIRATORY_TRACT
  Filled 2016-03-15 (×3): qty 3

## 2016-03-15 MED ORDER — METHYLPREDNISOLONE SODIUM SUCC 125 MG IJ SOLR
60.0000 mg | Freq: Two times a day (BID) | INTRAMUSCULAR | Status: DC
  Administered 2016-03-16: 60 mg via INTRAVENOUS
  Filled 2016-03-15: qty 2

## 2016-03-15 NOTE — Progress Notes (Signed)
Will try to leave OFF BiPAP tonight and see how patient does. BiPAP on stand BY.

## 2016-03-15 NOTE — Progress Notes (Signed)
PROGRESS NOTE    [name]  ZOX:[id number] DOB: 08-Apr-1931 DOA: 03/09/2016 PCP: [name], MD   Brief Narrative:  82 y.o. female with a history of atrial fibrillation with a chads score of 5 on aspirin and not on anticoagulation secondary to history of GI bleed, coronary artery disease with stenting of the RCA and a history of inferior wall MI in 1999, hypertension, type 2 diabetes, hyperlipidemia.She was recently diagnosed with atrial fibrillation in the outpatient setting and was being worked up by cardiology.  He presented to the hospital with weakness and fatigue, found to be in CHF with bilateral pleural effusion, cardiology and pulmonary were consulted, she underwent 1 L thoracentesis and left lung fluid removal on 03/10/2016 which appears to be transudate due to CHF. However her echogram shows a depressed EF with wall motion of normality.   Assessment & Plan:   Principal Problem:   Acute on chronic respiratory failure with hypoxia (HCC) Active Problems:   Type 2 diabetes mellitus (HCC)   COPD (chronic obstructive pulmonary disease) (HCC)   Pleural effusion on left   Atrial fibrillation (HCC)   Status post thoracentesis   Pleural effusion, bilateral   Atrial fibrillation with RVR (HCC)   Confusion   SOB (shortness of breath)   1. Acute on chronic hypoxic respiratory failure due to bilateral pleural effusions -left more than right.  -Secondary to acute on chronic combined systolic and diastolic CHF EF 81%. -She is status post left-sided thoracentesis with 1 L transudative fluid removal on 03/10/2016 - Pt became developed hypercarbic failure, requiring transfer to SDU and bipap support on 5/23 -continue diuresis with Lasix -CXR with mod-large L sided effusion. Pt on 2LNC and appears comfortable -Continue lasix per above as tolerated. -Will begin weaning steroids  2. New diagnosis of persistent atrial fibrillation.  -[place] vasc 2 score of close to 5.  -Currently on  Cardizem and aspirin,  -Started eliquis  3. New diagnosis of combined systolic and diastolic CHF EF 19% and with wall motion of normality  -Cardiology following. -Continue diuresis as tolerated per above -CXR findings per above  4. Diet-controlled type 2 diabetes mellitus -Continue sliding scale coverage as needed -Glucose overall stable   DVT prophylaxis: Heparin subcutaneous Code Status: Full Family Communication: Patient in room, family at bedside Disposition Plan: Skilled nursing facility, timing uncertain   Consultants:   Pulmonary  Cardiology  Procedures:  TTE - with LVH, EF 45% and diffuse wall motion abnormalities  Left-sided thoracentesis with 1 L transudative fluid removed on 03/10/2016  Antimicrobials:  Anti-infectives    Start     Dose/Rate Route Frequency Ordered Stop   03/12/16 2100  vancomycin (VANCOCIN) IVPB 750 mg/150 ml premix     750 mg 150 mL/hr over 60 Minutes Intravenous Every 12 hours 03/12/16 1313     03/12/16 1000  vancomycin (VANCOCIN) 1,500 mg in sodium chloride 0.9 % 500 mL IVPB     1,500 mg 250 mL/hr over 120 Minutes Intravenous  Once 03/12/16 0802 03/12/16 1114   03/12/16 0900  piperacillin-tazobactam (ZOSYN) IVPB 3.375 g     3.375 g 12.5 mL/hr over 240 Minutes Intravenous Every 8 hours 03/12/16 0802            Subjective: Reports feeling better today  Objective: Filed Vitals:   03/15/16 0146 03/15/16 0618 03/15/16 0817 03/15/16 1413  BP:  105/60  108/55  Pulse:  90  95  Temp:    98.3 F (36.8 C)  TempSrc:  Oral  Resp:  19  16  Height:      Weight:      SpO2: 90% 90% 93% 91%    Intake/Output Summary (Last 24 hours) at 03/15/16 1430 Last data filed at 03/15/16 1242  Gross per 24 hour  Intake    720 ml  Output   3000 ml  Net  -2280 ml   Filed Weights   03/10/16 0516 03/11/16 2300 03/12/16 0500  Weight: 75.4 kg (166 lb 3.6 oz) 78.4 kg (172 lb 13.5 oz) 78.4 kg (172 lb 13.5 oz)    Examination:  General exam:  Appears calm and comfortable, laying in bed Respiratory system: Clear to auscultation. Respiratory effort normal, No wheezing Cardiovascular system: S1 & S2 heard, RRR.  Gastrointestinal system: Abdomen is nondistended, soft and nontender. No organomegaly or masses felt. Normal bowel sounds heard. Central nervous system: Alert and oriented. No focal neurological deficits. Extremities: Symmetric 5 x 5 power. Skin: No rashes, lesions  Psychiatry: Judgement and insight appear normal. mood & affect normal    Data Reviewed: I have personally reviewed following labs and imaging studies  CBC:  Recent Labs Lab 03/09/16 1640 03/10/16 0445 03/11/16 0623 03/12/16 0412 03/15/16 0507  WBC 7.4 6.7 7.2 5.0 4.1  HGB 14.1 12.5 12.4 12.2 10.9*  HCT 48.7* 43.9 44.4 42.7 35.6*  MCV 103.6* 104.5* 105.7* 104.1* 98.3  PLT 172 152 145* 127* 119*   Basic Metabolic Panel:  Recent Labs Lab 03/11/16 0623 03/12/16 0412 03/13/16 0423 03/14/16 0518 03/15/16 0507  NA 141 140 137 138 139  K 3.8 4.2 3.4* 3.4* 3.7  CL 83* 81* 79* 81* 81*  CO2 >50* 49* 50* 49* 50*  GLUCOSE 127* 161* 219* 230* 251*  BUN 16 21* 29* 36* 45*  CREATININE 0.72 0.66 0.76 0.75 0.79  CALCIUM 8.6* 8.6* 8.4* 8.3* 8.4*  MG  --   --   --  1.9  --    GFR: Estimated Creatinine Clearance: 54.9 mL/min (by C-G formula based on Cr of 0.79). Liver Function Tests:  Recent Labs Lab 03/10/16 0625  AST 16  ALT 16  ALKPHOS 42  BILITOT 0.7  PROT 5.8*  ALBUMIN 3.4*   No results for input(s): LIPASE, AMYLASE in the last 168 hours. No results for input(s): AMMONIA in the last 168 hours. Coagulation Profile:  Recent Labs Lab 03/10/16 0625  INR 1.04   Cardiac Enzymes:  Recent Labs Lab 03/09/16 1640  TROPONINI 0.03   BNP (last 3 results) No results for input(s): PROBNP in the last 8760 hours. HbA1C: No results for input(s): HGBA1C in the last 72 hours. CBG:  Recent Labs Lab 03/14/16 1141 03/14/16 1603  03/14/16 2054 03/15/16 0737 03/15/16 1106  GLUCAP 304* 220* 244* 256* 283*   Lipid Profile: No results for input(s): CHOL, HDL, LDLCALC, TRIG, CHOLHDL, LDLDIRECT in the last 72 hours. Thyroid Function Tests: No results for input(s): TSH, T4TOTAL, FREET4, T3FREE, THYROIDAB in the last 72 hours. Anemia Panel: No results for input(s): VITAMINB12, FOLATE, FERRITIN, TIBC, IRON, RETICCTPCT in the last 72 hours. Sepsis Labs: No results for input(s): PROCALCITON, LATICACIDVEN in the last 168 hours.  Recent Results (from the past 240 hour(s))  Culture, body fluid-bottle     Status: None   Collection Time: 03/10/16  1:00 PM  Result Value Ref Range Status   Specimen Description FLUID LEFT PLEURAL COLLECTED BY DOCTOR BOLES  Final   Special Requests BOTTLES DRAWN AEROBIC AND ANAEROBIC 10CC EACH  Final   Culture  NO GROWTH 5 DAYS  Final   Report Status 03/15/2016 FINAL  Final  Gram stain     Status: None   Collection Time: 03/10/16  1:00 PM  Result Value Ref Range Status   Specimen Description FLUID LEFT PLEURAL COLLECTED BY DOCTOR BOLES  Final   Special Requests NONE  Final   Gram Stain   Final    CYTOSPIN SMEAR WBC PRESENT, PREDOMINANTLY MONONUCLEAR NO ORGANISMS SEEN Performed [facility]    Report Status 03/10/2016 FINAL  Final  MRSA PCR Screening     Status: None   Collection Time: 03/11/16 11:42 PM  Result Value Ref Range Status   MRSA by PCR NEGATIVE NEGATIVE Final    Comment:        The GeneXpert MRSA Assay (FDA approved for NASAL specimens only), is one component of a comprehensive MRSA colonization surveillance program. It is not intended to diagnose MRSA infection nor to guide or monitor treatment for MRSA infections.          Radiology Studies: Dg Chest Port 1 View  03/15/2016  CLINICAL DATA:  82 year old female with history of congestive heart failure. Weakness and fatigue. EXAM: PORTABLE CHEST 1 VIEW COMPARISON:  Chest x-ray 03/12/2016. FINDINGS:  Moderate to large left pleural effusion. Probable passive atelectasis throughout the base of the left lung. Small to moderate right pleural effusion. Probable passive subsegmental atelectasis in the right lower lobe. Mild pulmonary venous congestion, without frank pulmonary edema. Cardiomegaly. The patient is rotated to the left on today's exam, resulting in distortion of the mediastinal contours and reduced diagnostic sensitivity and specificity for mediastinal pathology. Atherosclerosis in the thoracic aorta. IMPRESSION: 1. Cardiomegaly with pulmonary venous congestion, but no frank pulmonary edema. 2. Moderate to large left and small to moderate right pleural effusions with persistent bibasilar atelectasis, as above. 3. Atherosclerosis. Electronically Signed   By: Trudie Reedaniel  Entrikin M.D.   On: 03/15/2016 07:48        Scheduled Meds: . antiseptic oral rinse  7 mL Mouth Rinse BID  . apixaban  5 mg Oral BID  . carvedilol  12.5 mg Oral BID WC  . digoxin  0.125 mg Oral Daily  . furosemide  60 mg Intravenous Q8H  . insulin aspart  0-15 Units Subcutaneous TID WC  . ipratropium-albuterol  3 mL Nebulization TID  . LORazepam  0.5 mg Oral Daily  . methylPREDNISolone (SOLU-MEDROL) injection  60 mg Intravenous TID  . pantoprazole  40 mg Oral Daily  . piperacillin-tazobactam (ZOSYN)  IV  3.375 g Intravenous Q8H  . sodium chloride flush  3 mL Intravenous Q12H  . vancomycin  750 mg Intravenous Q12H   Continuous Infusions:    LOS: 6 days     Izzah Pasqua, Scheryl MartenSTEPHEN K, MD [facility] Pager [phone]  If 7PM-7AM, please contact night-coverage [email or web address] Password [email or web address] 03/15/2016, 2:30 PM

## 2016-03-15 NOTE — Progress Notes (Signed)
Pharmacy Antibiotic Note  Kathy CuriaVirginia L Page is a 82 y.o. female admitted on 03/09/2016 with pneumonia.  [facility]  Plan:  Zosyn 3.375gm IV q8h, EID  Vancomycin 750mg  IV q12hrs, Consider d/c vanc as MRSA pcr (-)  Check trough at steady state  Monitor labs, renal fxn, progress and c/s  Height: 5\' 4"  (162.6 cm) Weight: 172 lb 13.5 oz (78.4 kg) IBW/kg (Calculated) : 54.7  Temp (24hrs), Avg:97.9 F (36.6 C), Min:97.7 F (36.5 C), Max:98 F (36.7 C)   Recent Labs Lab 03/09/16 1640 03/10/16 0445 03/11/16 0623 03/12/16 0412 03/13/16 0423 03/14/16 0518 03/15/16 0507  WBC 7.4 6.7 7.2 5.0  --   --  4.1  CREATININE 0.67 0.54 0.72 0.66 0.76 0.75 0.79    Estimated Creatinine Clearance: 54.9 mL/min (by C-G formula based on Cr of 0.79).    Allergies  Allergen Reactions  . Codeine Nausea Only  . Heparin Other (See Comments)    Causes internal bleeding     Antimicrobials this admission: 5/24 Vanc >>  5/24 Zosyn >>   Dose adjustments this admission:  Recent Results (from the past 240 hour(s))  Culture, body fluid-bottle     Status: None   Collection Time: 03/10/16  1:00 PM  Result Value Ref Range Status   Specimen Description FLUID LEFT PLEURAL COLLECTED BY DOCTOR BOLES  Final   Special Requests BOTTLES DRAWN AEROBIC AND ANAEROBIC 10CC EACH  Final   Culture NO GROWTH 5 DAYS  Final   Report Status 03/15/2016 FINAL  Final  Gram stain     Status: None   Collection Time: 03/10/16  1:00 PM  Result Value Ref Range Status   Specimen Description FLUID LEFT PLEURAL COLLECTED BY DOCTOR BOLES  Final   Special Requests NONE  Final   Gram Stain   Final    CYTOSPIN SMEAR WBC PRESENT, PREDOMINANTLY MONONUCLEAR NO ORGANISMS SEEN [facility]formed [facility]    Report Status 03/10/2016 FINAL  Final  MRSA PCR Screening     Status: None   Collection Time: 03/11/16 11:42 PM  Result Value Ref Range Status   MRSA by PCR NEGATIVE  NEGATIVE Final    Comment:        The GeneXpert MRSA Assay (FDA approved for NASAL specimens only), is one component of a comprehensive MRSA colonization surveillance program. It is not intended to diagnose MRSA infection nor to guide or monitor treatment for MRSA infections.     Thank you for allowing pharmacy to be a part of this patient's care.  Talbert CageSeay, Emmanuelle Coxe Poteet 03/15/2016 9:03 AM

## 2016-03-16 LAB — GLUCOSE, CAPILLARY
GLUCOSE-CAPILLARY: 293 mg/dL — AB (ref 65–99)
GLUCOSE-CAPILLARY: 217 mg/dL — AB (ref 65–99)
GLUCOSE-CAPILLARY: 317 mg/dL — AB (ref 65–99)
Glucose-Capillary: 274 mg/dL — ABNORMAL HIGH (ref 65–99)

## 2016-03-16 LAB — BASIC METABOLIC PANEL
ANION GAP: 9 (ref 5–15)
BUN: 48 mg/dL — ABNORMAL HIGH (ref 6–20)
CALCIUM: 8.3 mg/dL — AB (ref 8.9–10.3)
CO2: 50 mmol/L — ABNORMAL HIGH (ref 22–32)
Chloride: 79 mmol/L — ABNORMAL LOW (ref 101–111)
Creatinine, Ser: 0.84 mg/dL (ref 0.44–1.00)
GLUCOSE: 263 mg/dL — AB (ref 65–99)
POTASSIUM: 3.4 mmol/L — AB (ref 3.5–5.1)
Sodium: 138 mmol/L (ref 135–145)

## 2016-03-16 LAB — VANCOMYCIN, TROUGH: VANCOMYCIN TR: 24 ug/mL — AB (ref 10.0–20.0)

## 2016-03-16 MED ORDER — METHYLPREDNISOLONE SODIUM SUCC 40 MG IJ SOLR
40.0000 mg | Freq: Two times a day (BID) | INTRAMUSCULAR | Status: DC
  Administered 2016-03-16 (×2): 40 mg via INTRAVENOUS
  Filled 2016-03-16 (×2): qty 1

## 2016-03-16 MED ORDER — POTASSIUM CHLORIDE CRYS ER 20 MEQ PO TBCR
40.0000 meq | EXTENDED_RELEASE_TABLET | Freq: Once | ORAL | Status: AC
  Administered 2016-03-16: 40 meq via ORAL
  Filled 2016-03-16: qty 2

## 2016-03-16 MED ORDER — IPRATROPIUM-ALBUTEROL 0.5-2.5 (3) MG/3ML IN SOLN: 3.0000 mL | Freq: Four times a day (QID) | RESPIRATORY_TRACT | Status: DC | PRN

## 2016-03-16 MED ORDER — VANCOMYCIN HCL 10 G IV SOLR
1250.0000 mg | INTRAVENOUS | Status: DC
  Filled 2016-03-16: qty 1250

## 2016-03-16 MED ORDER — INSULIN ASPART 100 UNIT/ML ~~LOC~~ SOLN
0.0000 [IU] | Freq: Three times a day (TID) | SUBCUTANEOUS | Status: DC
  Administered 2016-03-16 (×2): 11 [IU] via SUBCUTANEOUS
  Administered 2016-03-16: 7 [IU] via SUBCUTANEOUS
  Administered 2016-03-17: 3 [IU] via SUBCUTANEOUS
  Administered 2016-03-17 (×2): 7 [IU] via SUBCUTANEOUS
  Administered 2016-03-18: 4 [IU] via SUBCUTANEOUS
  Administered 2016-03-18: 3 [IU] via SUBCUTANEOUS
  Administered 2016-03-19: 11 [IU] via SUBCUTANEOUS
  Administered 2016-03-19: 7 [IU] via SUBCUTANEOUS
  Administered 2016-03-19: 4 [IU] via SUBCUTANEOUS
  Administered 2016-03-20: 11 [IU] via SUBCUTANEOUS
  Administered 2016-03-20: 4 [IU] via SUBCUTANEOUS

## 2016-03-16 NOTE — Progress Notes (Signed)
2214 Spoke with pharmacist Talbert CageLora Seay regarding patient's Vancomycin trough level and current Vancomycin IV dose. Mervyn GayLora verified that she would review the patient's orders and make necessary changes.

## 2016-03-16 NOTE — Progress Notes (Signed)
2212 Vancomycin trough level 24, on call pharmacist Talbert CageLora Seay paged.

## 2016-03-16 NOTE — Progress Notes (Signed)
Inpatient Diabetes Program Recommendations  AACE/ADA: New Consensus Statement on Inpatient Glycemic Control (2015)  Target Ranges:  Prepandial:   less than 140 mg/dL      Peak postprandial:   less than 180 mg/dL (1-2 hours)      Critically ill patients:  140 - 180 mg/dL   Review of Glycemic Control  Results for Simone CuriaOWELL, Randa L (MRN [id number]) as of 03/16/2016 22:17  Ref. Range 03/13/2016 04:23 03/14/2016 05:18 03/15/2016 05:07 03/16/2016 06:33  Glucose Latest Ref Range: 65-99 mg/dL 409219 (H) 811230 (H) 914251 (H) 263 (H)   Results for Simone CuriaOWELL, Samanthamarie L (MRN [id number]) as of 03/16/2016 22:17  Ref. Range 03/15/2016 21:15 03/16/2016 07:59 03/16/2016 12:02 03/16/2016 16:28 03/16/2016 20:41  Glucose-Capillary Latest Ref Range: 65-99 mg/dL 086224 (H) 578274 (H) 469293 (H) 217 (H) 317 (H)  Needs insulin adjustment.  Inpatient Diabetes Program Recommendations:  Novolog increased to resistant tidwc Consider addition of Levemir 10 units Q24H while on steroids. Needs HgbA1C to assess glycemic control prior to hospitalization.  Will continue to follow. Thank you. Ailene Ardshonda Birtha Hatler, RD, LDN, CDE Inpatient Diabetes Coordinator [phone]

## 2016-03-16 NOTE — Progress Notes (Signed)
Pharmacy Antibiotic Note  [name] is a 82 y.o. female admitted on 03/09/2016 with pneumonia.  [facility].  Vancomycin trough 24 mcg/ml  Plan:  Cont Zosyn 3.375gm IV q8h, EID  Change vanc to 1250 mg IV q24 hours, Consider d/c vanc as MRSA pcr (-)    Monitor labs, renal fxn, progress and c/s  Height: 5\' 4"  (162.6 cm) Weight: 172 lb 13.5 oz (78.4 kg) IBW/kg (Calculated) : 54.7  Temp (24hrs), Avg:98.2 F (36.8 C), Min:97.5 F (36.4 C), Max:98.8 F (37.1 C)   Recent Labs Lab 03/10/16 0445 03/11/16 0623 03/12/16 0412 03/13/16 0423 03/14/16 0518 03/15/16 0507 03/16/16 0633 03/16/16 2017  WBC 6.7 7.2 5.0  --   --  4.1  --   --   CREATININE 0.54 0.72 0.66 0.76 0.75 0.79 0.84  --   VANCOTROUGH  --   --   --   --   --   --   --  24*    Estimated Creatinine Clearance: 52.3 mL/min (by C-G formula based on Cr of 0.84).    Allergies  Allergen Reactions  . Codeine Nausea Only  . Heparin Other (See Comments)    Causes internal bleeding     Antimicrobials this admission: 5/24 Vanc >>  5/24 Zosyn >>   Dose adjustments this admission: 5/28 Vanc changed 750 q12 to 1250 q24 with VT 24 mcg/ml  Recent Results (from the past 240 hour(s))  Culture, body fluid-bottle     Status: None   Collection Time: 03/10/16  1:00 PM  Result Value Ref Range Status   Specimen Description FLUID LEFT PLEURAL COLLECTED BY DOCTOR BOLES  Final   Special Requests BOTTLES DRAWN AEROBIC AND ANAEROBIC 10CC EACH  Final   Culture NO GROWTH 5 DAYS  Final   Report Status 03/15/2016 FINAL  Final  Gram stain     Status: None   Collection Time: 03/10/16  1:00 PM  Result Value Ref Range Status   Specimen Description FLUID LEFT PLEURAL COLLECTED BY DOCTOR BOLES  Final   Special Requests NONE  Final   Gram Stain   Final    CYTOSPIN SMEAR WBC PRESENT, PREDOMINANTLY MONONUCLEAR NO ORGANISMS SEEN Performed [facility]    Report Status  03/10/2016 FINAL  Final  MRSA PCR Screening     Status: None   Collection Time: 03/11/16 11:42 PM  Result Value Ref Range Status   MRSA by PCR NEGATIVE NEGATIVE Final    Comment:        The GeneXpert MRSA Assay (FDA approved for NASAL specimens only), is one component of a comprehensive MRSA colonization surveillance program. It is not intended to diagnose MRSA infection nor to guide or monitor treatment for MRSA infections.     Thank you for allowing pharmacy to be a part of this patient's care.  Woodfin GanjaSeay, Roshaun Pound Poteet 03/16/2016 10:19 PM

## 2016-03-16 NOTE — Progress Notes (Signed)
PROGRESS NOTE    [name]  ZOX:[id number] DOB: 1931/06/16 DOA: 03/09/2016 PCP: [name], MD   Brief Narrative:  82 y.o. female with a history of atrial fibrillation with a chads score of 5 on aspirin and not on anticoagulation secondary to history of GI bleed, coronary artery disease with stenting of the RCA and a history of inferior wall MI in 1999, hypertension, type 2 diabetes, hyperlipidemia.She was recently diagnosed with atrial fibrillation in the outpatient setting and was being worked up by cardiology.  He presented to the hospital with weakness and fatigue, found to be in CHF with bilateral pleural effusion, cardiology and pulmonary were consulted, she underwent 1 L thoracentesis and left lung fluid removal on 03/10/2016 which appears to be transudate due to CHF. However her echogram shows a depressed EF with wall motion of normality.   Assessment & Plan:   Principal Problem:   Acute on chronic respiratory failure with hypoxia (HCC) Active Problems:   Type 2 diabetes mellitus (HCC)   COPD (chronic obstructive pulmonary disease) (HCC)   Pleural effusion on left   Atrial fibrillation (HCC)   Status post thoracentesis   Pleural effusion, bilateral   Atrial fibrillation with RVR (HCC)   Confusion   SOB (shortness of breath)   1. Acute on chronic hypoxic respiratory failure due to bilateral pleural effusions -left more than right.  -Secondary to acute on chronic combined systolic and diastolic CHF EF 81%. -She is status post left-sided thoracentesis with 1 L transudative fluid removal on 03/10/2016 - Pt became developed hypercarbic failure, requiring transfer to SDU and bipap support on 5/23 -continue diuresis with Lasix -CXR with mod-large L sided effusion. Pt on 2LNC and appears comfortable -Continue lasix per above as tolerated. -Continue weaning steroids. Will decrease from 60 mg to 40 mg twice a day. -Patient has tolerated trial without BiPAP overnight.  2.  New diagnosis of persistent atrial fibrillation.  -[place] vasc 2 score of close to 5.  -Currently remains on Cardizem and aspirin,  -Started on eliquis  3. New diagnosis of combined systolic and diastolic CHF EF 19% and with wall motion of normality  -Cardiology following. -Continue diuresis as tolerated per above -CXR findings per above  4. Diet-controlled type 2 diabetes mellitus -Continue sliding scale coverage as needed -Glucose overall stable   DVT prophylaxis: Heparin subcutaneous Code Status: Full Family Communication: Patient in room, family at bedside Disposition Plan: Skilled nursing facility, timing uncertain   Consultants:   Pulmonary  Cardiology  Procedures:  TTE - with LVH, EF 45% and diffuse wall motion abnormalities  Left-sided thoracentesis with 1 L transudative fluid removed on 03/10/2016  Antimicrobials:  Anti-infectives    Start     Dose/Rate Route Frequency Ordered Stop   03/12/16 2100  vancomycin (VANCOCIN) IVPB 750 mg/150 ml premix     750 mg 150 mL/hr over 60 Minutes Intravenous Every 12 hours 03/12/16 1313     03/12/16 1000  vancomycin (VANCOCIN) 1,500 mg in sodium chloride 0.9 % 500 mL IVPB     1,500 mg 250 mL/hr over 120 Minutes Intravenous  Once 03/12/16 0802 03/12/16 1114   03/12/16 0900  piperacillin-tazobactam (ZOSYN) IVPB 3.375 g     3.375 g 12.5 mL/hr over 240 Minutes Intravenous Every 8 hours 03/12/16 0802            Subjective: Patient is without complaints today. Denies shortness of breath. Reports sleeping well last night without BiPAP.  Objective: Filed Vitals:   03/16/16 0022  03/16/16 0455 03/16/16 0728 03/16/16 1455  BP: 109/51 120/55  101/52  Pulse: 80 84  88  Temp: 97.6 F (36.4 C) 97.5 F (36.4 C)  98.7 F (37.1 C)  TempSrc: Oral Oral  Oral  Resp: 18 16  18   Height:      Weight:      SpO2: 90% 93% 95% 97%    Intake/Output Summary (Last 24 hours) at 03/16/16 1532 Last data filed at 03/16/16 0458  Gross per  24 hour  Intake    240 ml  Output   1350 ml  Net  -1110 ml   Filed Weights   03/10/16 0516 03/11/16 2300 03/12/16 0500  Weight: 75.4 kg (166 lb 3.6 oz) 78.4 kg (172 lb 13.5 oz) 78.4 kg (172 lb 13.5 oz)    Examination:  General exam: Appears calm and comfortable, laying in bed, Eating Respiratory system: Clear to auscultation. Respiratory effort normal, No wheezing Cardiovascular system: S1 & S2 heard, RRR.  Gastrointestinal system: Abdomen is nondistended, soft and nontender. No organomegaly or masses felt. Normal bowel sounds heard. Central nervous system: Alert and oriented. No focal neurological deficits. Extremities: Symmetric 5 x 5 power. Skin: No rashes, lesions  Psychiatry: Judgement and insight appear normal. Normal mood & affect    Data Reviewed: I have personally reviewed following labs and imaging studies  CBC:  Recent Labs Lab 03/09/16 1640 03/10/16 0445 03/11/16 0623 03/12/16 0412 03/15/16 0507  WBC 7.4 6.7 7.2 5.0 4.1  HGB 14.1 12.5 12.4 12.2 10.9*  HCT 48.7* 43.9 44.4 42.7 35.6*  MCV 103.6* 104.5* 105.7* 104.1* 98.3  PLT 172 152 145* 127* 119*   Basic Metabolic Panel:  Recent Labs Lab 03/12/16 0412 03/13/16 0423 03/14/16 0518 03/15/16 0507 03/16/16 0633  NA 140 137 138 139 138  K 4.2 3.4* 3.4* 3.7 3.4*  CL 81* 79* 81* 81* 79*  CO2 49* 50* 49* 50* 50*  GLUCOSE 161* 219* 230* 251* 263*  BUN 21* 29* 36* 45* 48*  CREATININE 0.66 0.76 0.75 0.79 0.84  CALCIUM 8.6* 8.4* 8.3* 8.4* 8.3*  MG  --   --  1.9  --   --    GFR: Estimated Creatinine Clearance: 52.3 mL/min (by C-G formula based on Cr of 0.84). Liver Function Tests:  Recent Labs Lab 03/10/16 0625  AST 16  ALT 16  ALKPHOS 42  BILITOT 0.7  PROT 5.8*  ALBUMIN 3.4*   No results for input(s): LIPASE, AMYLASE in the last 168 hours. No results for input(s): AMMONIA in the last 168 hours. Coagulation Profile:  Recent Labs Lab 03/10/16 0625  INR 1.04   Cardiac Enzymes:  Recent  Labs Lab 03/09/16 1640  TROPONINI 0.03   BNP (last 3 results) No results for input(s): PROBNP in the last 8760 hours. HbA1C: No results for input(s): HGBA1C in the last 72 hours. CBG:  Recent Labs Lab 03/15/16 1106 03/15/16 1656 03/15/16 2115 03/16/16 0759 03/16/16 1202  GLUCAP 283* 263* 224* 274* 293*   Lipid Profile: No results for input(s): CHOL, HDL, LDLCALC, TRIG, CHOLHDL, LDLDIRECT in the last 72 hours. Thyroid Function Tests: No results for input(s): TSH, T4TOTAL, FREET4, T3FREE, THYROIDAB in the last 72 hours. Anemia Panel: No results for input(s): VITAMINB12, FOLATE, FERRITIN, TIBC, IRON, RETICCTPCT in the last 72 hours. Sepsis Labs: No results for input(s): PROCALCITON, LATICACIDVEN in the last 168 hours.  Recent Results (from the past 240 hour(s))  Culture, body fluid-bottle     Status: None   Collection Time:  03/10/16  1:00 PM  Result Value Ref Range Status   Specimen Description FLUID LEFT PLEURAL COLLECTED BY DOCTOR BOLES  Final   Special Requests BOTTLES DRAWN AEROBIC AND ANAEROBIC 10CC EACH  Final   Culture NO GROWTH 5 DAYS  Final   Report Status 03/15/2016 FINAL  Final  Gram stain     Status: None   Collection Time: 03/10/16  1:00 PM  Result Value Ref Range Status   Specimen Description FLUID LEFT PLEURAL COLLECTED BY DOCTOR BOLES  Final   Special Requests NONE  Final   Gram Stain   Final    CYTOSPIN SMEAR WBC PRESENT, PREDOMINANTLY MONONUCLEAR NO ORGANISMS SEEN Performed at [facility]    Report Status 03/10/2016 FINAL  Final  MRSA PCR Screening     Status: None   Collection Time: 03/11/16 11:42 PM  Result Value Ref Range Status   MRSA by PCR NEGATIVE NEGATIVE Final    Comment:        The GeneXpert MRSA Assay (FDA approved for NASAL specimens only), is one component of a comprehensive MRSA colonization surveillance program. It is not intended to diagnose MRSA infection nor to guide or monitor treatment for MRSA infections.           Radiology Studies: Dg Chest Port 1 View  03/15/2016  CLINICAL DATA:  82 year old female with history of congestive heart failure. Weakness and fatigue. EXAM: PORTABLE CHEST 1 VIEW COMPARISON:  Chest x-ray 03/12/2016. FINDINGS: Moderate to large left pleural effusion. Probable passive atelectasis throughout the base of the left lung. Small to moderate right pleural effusion. Probable passive subsegmental atelectasis in the right lower lobe. Mild pulmonary venous congestion, without frank pulmonary edema. Cardiomegaly. The patient is rotated to the left on today's exam, resulting in distortion of the mediastinal contours and reduced diagnostic sensitivity and specificity for mediastinal pathology. Atherosclerosis in the thoracic aorta. IMPRESSION: 1. Cardiomegaly with pulmonary venous congestion, but no frank pulmonary edema. 2. Moderate to large left and small to moderate right pleural effusions with persistent bibasilar atelectasis, as above. 3. Atherosclerosis. Electronically Signed   By: Trudie Reed M.D.   On: 03/15/2016 07:48        Scheduled Meds: . antiseptic oral rinse  7 mL Mouth Rinse BID  . apixaban  5 mg Oral BID  . carvedilol  12.5 mg Oral BID WC  . digoxin  0.125 mg Oral Daily  . furosemide  60 mg Intravenous Q8H  . insulin aspart  0-20 Units Subcutaneous TID WC  . LORazepam  0.5 mg Oral Daily  . methylPREDNISolone (SOLU-MEDROL) injection  40 mg Intravenous Q12H  . pantoprazole  40 mg Oral Daily  . piperacillin-tazobactam (ZOSYN)  IV  3.375 g Intravenous Q8H  . sodium chloride flush  3 mL Intravenous Q12H  . vancomycin  750 mg Intravenous Q12H   Continuous Infusions:    LOS: 7 days     Nicholad Kautzman, Scheryl Marten, MD [facility] Pager [phone]  If 7PM-7AM, please contact night-coverage [email or web address] Password [facility] 03/16/2016, 3:32 PM

## 2016-03-17 LAB — BASIC METABOLIC PANEL
Anion gap: 8 (ref 5–15)
BUN: 45 mg/dL — ABNORMAL HIGH (ref 6–20)
CHLORIDE: 81 mmol/L — AB (ref 101–111)
CO2: 49 mmol/L — AB (ref 22–32)
CREATININE: 0.87 mg/dL (ref 0.44–1.00)
Calcium: 8.1 mg/dL — ABNORMAL LOW (ref 8.9–10.3)
GFR calc non Af Amer: >60 mL/min (ref >60)
GLUCOSE: 269 mg/dL — AB (ref 65–99)
Potassium: 3.9 mmol/L (ref 3.5–5.1)
Sodium: 138 mmol/L (ref 135–145)

## 2016-03-17 LAB — GLUCOSE, CAPILLARY
GLUCOSE-CAPILLARY: 227 mg/dL — AB (ref 65–99)
GLUCOSE-CAPILLARY: 220 mg/dL — AB (ref 65–99)
Glucose-Capillary: 221 mg/dL — ABNORMAL HIGH (ref 65–99)
Glucose-Capillary: 130 mg/dL — ABNORMAL HIGH (ref 65–99)

## 2016-03-17 LAB — BLOOD GAS, ARTERIAL
DRAWN BY: 23534
O2 CONTENT: 2 L/min
O2 Saturation: 88.8 %
pH, Arterial: 7.223 — ABNORMAL LOW (ref 7.350–7.450)
pO2, Arterial: 65.1 mmHg — ABNORMAL LOW (ref 80.0–100.0)

## 2016-03-17 LAB — DIGOXIN LEVEL: Digoxin Level: 0.9 ng/mL (ref 0.8–2.0)

## 2016-03-17 MED ORDER — VANCOMYCIN HCL 10 G IV SOLR
1250.0000 mg | INTRAVENOUS | Status: DC
  Administered 2016-03-17 – 2016-03-19 (×3): 1250 mg via INTRAVENOUS
  Filled 2016-03-17 (×4): qty 1250

## 2016-03-17 MED ORDER — CHLORHEXIDINE GLUCONATE 0.12 % MT SOLN
15.0000 mL | Freq: Two times a day (BID) | OROMUCOSAL | Status: DC
  Administered 2016-03-18 – 2016-03-20 (×6): 15 mL via OROMUCOSAL
  Filled 2016-03-17 (×3): qty 15

## 2016-03-17 MED ORDER — METHYLPREDNISOLONE SODIUM SUCC 40 MG IJ SOLR
40.0000 mg | INTRAMUSCULAR | Status: DC
  Administered 2016-03-17: 40 mg via INTRAVENOUS
  Filled 2016-03-17: qty 1

## 2016-03-17 MED ORDER — LABETALOL HCL 5 MG/ML IV SOLN
5.0000 mg | INTRAVENOUS | Status: DC | PRN
  Administered 2016-03-17: 5 mg via INTRAVENOUS
  Filled 2016-03-17 (×2): qty 4

## 2016-03-17 MED ORDER — METHYLPREDNISOLONE SODIUM SUCC 125 MG IJ SOLR
60.0000 mg | Freq: Three times a day (TID) | INTRAMUSCULAR | Status: DC
  Administered 2016-03-17 – 2016-03-20 (×8): 60 mg via INTRAVENOUS
  Filled 2016-03-17 (×8): qty 2

## 2016-03-17 MED ORDER — CETYLPYRIDINIUM CHLORIDE 0.05 % MT LIQD
7.0000 mL | Freq: Two times a day (BID) | OROMUCOSAL | Status: DC
  Administered 2016-03-18 – 2016-03-19 (×4): 7 mL via OROMUCOSAL

## 2016-03-17 NOTE — Care Management Important Message (Signed)
Important Message  Patient Details  Name: Kathy CuriaVirginia L Page MRN: [id number] Date of Birth: 08/12/1931   Medicare Important Message Given:  Yes    Adonis HugueninBerkhead, Dalila Arca L, RN 03/17/2016, 9:22 AM

## 2016-03-17 NOTE — Progress Notes (Signed)
Pt noted to be more confused and tired appearing, albeit alert and conversant. ABG done with pH of 7.2 and pCO2 that was un-measurable.  Over the past several days, pt has apparently refused wearing nocturnal bipap. For now, will transfer to SDU for continued bipap. Will increase steroid dose. Family at bedside, all questions answered and all are in agreement with plan.

## 2016-03-17 NOTE — Progress Notes (Signed)
Physical Therapy Treatment Patient Details Name: NGOC DETJEN MRN: [id number] DOB: October 26, 1930 Today's Date: 03/17/2016    History of Present Illness 82 yo F admitted with worsening SOB over the last 3-4 weeks.  DX: CHF with B pleural effusions.  S/P thoracentesis 5/22 with removal of 1L.  Echo with depressed EF of 45%.  3/22 - pt had some altered mental status with ABG was 7.22/109/ 63, and CO2>40 - requiring BiPAP, and therefore transferred to ICU.  PMH: A-fib, not on anticoagulation due to GI bleed, CAD with RCA stenting, MI, HTN, DM2, asthma, COPD, cholecystectomy, exploratory laparotomy, partial colectomy.     PT Comments    Pt sitting in chair upon PTA entrance, aware and willing to participate with therapy today.  No reports of pain or SOB.  Pt progressing towards goals with less assistance required for sit to stand and increased distance with gait training today with RW though pt does continue to exhibit weakness and decreased activity tolerance.  Pt ambulated 18 feet then stated she felt nauseous so we returned to room sitting in chair.  Decreased SpO2 to 82% following gait training.  Deep breathing technqiues instructed and increased O2 to 3L.  SpO2 returned to 96% within seconds and able to reduce to 2L with SpO2 at 97%.  RN informed.  Pt with no c/o SOB, chest pain and nausea at end of session.  Pt did request to return to bed as she had been sitting up since 8 am.  No reports of pain at end of session.     Follow Up Recommendations  SNF     Equipment Recommendations  None recommended by PT    Recommendations for Other Services       Precautions / Restrictions Precautions Precautions: Fall Restrictions Weight Bearing Restrictions: No    Mobility  Bed Mobility                  Transfers Overall transfer level: Modified independent Equipment used: Rolling walker (2 wheeled) Transfers: Sit to/from Stand Sit to Stand: Min assist         General transfer  comment: Cueing for scooting techniques to edge of chair and hand placement to assist with sit to stand from chair.  Ambulation/Gait Ambulation/Gait assistance: Min assist Ambulation Distance (Feet): 36 Feet Assistive device: Rolling walker (2 wheeled) Gait Pattern/deviations: Step-through pattern;Trunk flexed   Gait velocity interpretation: <1.8 ft/sec, indicative of risk for recurrent falls General Gait Details: Very slow but steady with step thru gait.  Ambulated 18 feet then c/o nausea then returned to chair with decreased SpO2 to 82% x 32seconds, increase O2 to 3L with return to 96% in less than 10 seconds, deep breathing techniques instructed with abiltiy to return to 2L with SpO2 at 97%.   Stairs            Wheelchair Mobility    Modified Rankin (Stroke Patients Only)       Balance                                    Cognition Arousal/Alertness: Awake/alert Behavior During Therapy: WFL for tasks assessed/performed Overall Cognitive Status: Within Functional Limits for tasks assessed                      Exercises      General Comments        Pertinent Vitals/Pain Pain  Assessment: No/denies pain    Home Living                      Prior Function            PT Goals (current goals can now be found in the care plan section) Acute Rehab PT Goals Patient Stated Goal: Pt wants to get her strength back PT Goal Formulation: With patient/family Time For Goal Achievement: 03/18/16 Progress towards PT goals: Progressing toward goals    Frequency  Min 5X/week    PT Plan Current plan remains appropriate    Co-evaluation             End of Session Equipment Utilized During Treatment: Gait belt;Oxygen Activity Tolerance: Patient limited by fatigue;Patient tolerated treatment well Patient left: in bed;with call bell/phone within reach;with family/visitor present;with SCD's reapplied     Time: [id number] PT Time  Calculation (min) (ACUTE ONLY): 40 min  Charges:  $Therapeutic Exercise: 8-22 mins $Therapeutic Activity: 8-22 mins                    G Codes:      Juel BurrowCockerham, Casey Jo 03/17/2016, 10:18 AM

## 2016-03-17 NOTE — Progress Notes (Signed)
BRIEF NUTRITION NOTE:  Pt meets LOS criteria. Spoke to RN who reports pt will probably be here another couple days. Pt weight is steady. Pt has gained 5 lbs since admission per chart. NFPE reveals no depletion, mild edema. Pt refuses Ensure. PO's good, 25-100%. No nutrition interventions warranted at this time, consult if nutrition issues arise.  Beryle QuantMeredith Lovelyn Sheeran, MS [facility] Dietetic Intern Pager [phone]

## 2016-03-17 NOTE — Progress Notes (Signed)
Patient transferred to SD bed 6.  Family aware.  [facility].

## 2016-03-17 NOTE — Progress Notes (Signed)
Pt arrived from rm 330, alert, following directions but minimal verbal responces. Is shaking head yes or no appropriately. Being placed on bipap now. Family in waiting room.

## 2016-03-17 NOTE — Progress Notes (Signed)
PROGRESS NOTE    [name]  WUJ:[id number] DOB: May 08, 1931 DOA: 03/09/2016 PCP: [name], MD   Brief Narrative:  82 y.o. female with a history of atrial fibrillation with a chads score of 5 on aspirin and not on anticoagulation secondary to history of GI bleed, coronary artery disease with stenting of the RCA and a history of inferior wall MI in 1999, hypertension, type 2 diabetes, hyperlipidemia.She was recently diagnosed with atrial fibrillation in the outpatient setting and was being worked up by cardiology.  He presented to the hospital with weakness and fatigue, found to be in CHF with bilateral pleural effusion, cardiology and pulmonary were consulted, she underwent 1 L thoracentesis and left lung fluid removal on 03/10/2016 which appears to be transudate due to CHF. However her echogram shows a depressed EF with wall motion of normality.   Assessment & Plan:   Principal Problem:   Acute on chronic respiratory failure with hypoxia (HCC) Active Problems:   Type 2 diabetes mellitus (HCC)   COPD (chronic obstructive pulmonary disease) (HCC)   Pleural effusion on left   Atrial fibrillation (HCC)   Status post thoracentesis   Pleural effusion, bilateral   Atrial fibrillation with RVR (HCC)   Confusion   SOB (shortness of breath)   1. Acute on chronic hypoxic respiratory failure due to bilateral pleural effusions -left more than right.  -Secondary to acute on chronic combined systolic and diastolic CHF EF 95%. -She is status post left-sided thoracentesis with 1 L transudative fluid removal on 03/10/2016 - Pt became developed hypercarbic failure, requiring transfer to SDU and bipap support on 5/23 -continue diuresis with Lasix -CXR with mod-large L sided effusion. Pt on 2LNC and appears comfortable -Continue lasix per above as tolerated. -Continue weaning steroids. Will further decrease IV Solu-Medrol to 40 mg daily, possible transition to by mouth prednisone in the next  24 hours.. -Patient now without BiPAP for the past 2 days. -We'll repeat chest x-ray the morning for interval change  2. New diagnosis of persistent atrial fibrillation.  -[place] vasc 2 score of close to 5.  -Currently remains on Cardizem and aspirin,  -Started on eliquis  3. New diagnosis of combined systolic and diastolic CHF EF 62% and with wall motion of normality  -Cardiology following. -Continue diuresis as tolerated per above  4. Diet-controlled type 2 diabetes mellitus -Continue sliding scale coverage as needed -Insulin increased to resistance scale as patient is on concurrent steroids.   DVT prophylaxis: Heparin subcutaneous Code Status: Full Family Communication: Patient in room, family at bedside Disposition Plan: Skilled nursing facility, timing uncertain   Consultants:   Pulmonary  Cardiology  Procedures:  TTE - with LVH, EF 45% and diffuse wall motion abnormalities  Left-sided thoracentesis with 1 L transudative fluid removed on 03/10/2016  Antimicrobials:  Anti-infectives    Start     Dose/Rate Route Frequency Ordered Stop   03/17/16 0800  vancomycin (VANCOCIN) 1,250 mg in sodium chloride 0.9 % 250 mL IVPB     1,250 mg 166.7 mL/hr over 90 Minutes Intravenous Every 24 hours 03/17/16 0521     03/17/16 0700  vancomycin (VANCOCIN) 1,250 mg in sodium chloride 0.9 % 250 mL IVPB  Status:  Discontinued     1,250 mg 166.7 mL/hr over 90 Minutes Intravenous Every 24 hours 03/16/16 2219 03/17/16 0521   03/12/16 2100  vancomycin (VANCOCIN) IVPB 750 mg/150 ml premix  Status:  Discontinued     750 mg 150 mL/hr over 60 Minutes Intravenous Every  12 hours 03/12/16 1313 03/16/16 2219   03/12/16 1000  vancomycin (VANCOCIN) 1,500 mg in sodium chloride 0.9 % 500 mL IVPB     1,500 mg 250 mL/hr over 120 Minutes Intravenous  Once 03/12/16 0802 03/12/16 1114   03/12/16 0900  piperacillin-tazobactam (ZOSYN) IVPB 3.375 g     3.375 g 12.5 mL/hr over 240 Minutes Intravenous Every 8  hours 03/12/16 0802            Subjective: Patient reports feeling better today.  Objective: Filed Vitals:   03/16/16 1455 03/16/16 2043 03/17/16 0518 03/17/16 1232  BP: 101/52 122/66 125/56 129/69  Pulse: 88 80 90 122  Temp: 98.7 F (37.1 C) 98.8 F (37.1 C) 97.7 F (36.5 C)   TempSrc: Oral Oral Oral   Resp: 18 18 18    Height:      Weight:      SpO2: 97% 95% 94%     Intake/Output Summary (Last 24 hours) at 03/17/16 1412 Last data filed at 03/17/16 1300  Gross per 24 hour  Intake      0 ml  Output   2025 ml  Net  -2025 ml   Filed Weights   03/10/16 0516 03/11/16 2300 03/12/16 0500  Weight: 75.4 kg (166 lb 3.6 oz) 78.4 kg (172 lb 13.5 oz) 78.4 kg (172 lb 13.5 oz)    Examination:  General exam: Appears calm and comfortable, Sitting in bed, Eating Respiratory system: Clear to auscultation. Respiratory effort normal, No wheezing Cardiovascular system: S1 & S2 heard, RRR.  Gastrointestinal system: Abdomen is nondistended, soft and nontender. No organomegaly or masses felt. Normal bowel sounds heard. Central nervous system: Alert and oriented. No focal neurological deficits. Extremities: Symmetric 5 x 5 power. Skin: No rashes, lesions  Psychiatry: Judgement and insight appear normal. Affect and mood normal    Data Reviewed: I have personally reviewed following labs and imaging studies  CBC:  Recent Labs Lab 03/11/16 0623 03/12/16 0412 03/15/16 0507  WBC 7.2 5.0 4.1  HGB 12.4 12.2 10.9*  HCT 44.4 42.7 35.6*  MCV 105.7* 104.1* 98.3  PLT 145* 127* 119*   Basic Metabolic Panel:  Recent Labs Lab 03/13/16 0423 03/14/16 0518 03/15/16 0507 03/16/16 0633 03/17/16 0557  NA 137 138 139 138 138  K 3.4* 3.4* 3.7 3.4* 3.9  CL 79* 81* 81* 79* 81*  CO2 50* 49* 50* 50* 49*  GLUCOSE 219* 230* 251* 263* 269*  BUN 29* 36* 45* 48* 45*  CREATININE 0.76 0.75 0.79 0.84 0.87  CALCIUM 8.4* 8.3* 8.4* 8.3* 8.1*  MG  --  1.9  --   --   --    GFR: Estimated  Creatinine Clearance: 50.5 mL/min (by C-G formula based on Cr of 0.87). Liver Function Tests: No results for input(s): AST, ALT, ALKPHOS, BILITOT, PROT, ALBUMIN in the last 168 hours. No results for input(s): LIPASE, AMYLASE in the last 168 hours. No results for input(s): AMMONIA in the last 168 hours. Coagulation Profile: No results for input(s): INR, PROTIME in the last 168 hours. Cardiac Enzymes: No results for input(s): CKTOTAL, CKMB, CKMBINDEX, TROPONINI in the last 168 hours. BNP (last 3 results) No results for input(s): PROBNP in the last 8760 hours. HbA1C: No results for input(s): HGBA1C in the last 72 hours. CBG:  Recent Labs Lab 03/16/16 1202 03/16/16 1628 03/16/16 2041 03/17/16 0814 03/17/16 1150  GLUCAP 293* 217* 317* 221* 227*   Lipid Profile: No results for input(s): CHOL, HDL, LDLCALC, TRIG, CHOLHDL, LDLDIRECT in the  last 72 hours. Thyroid Function Tests: No results for input(s): TSH, T4TOTAL, FREET4, T3FREE, THYROIDAB in the last 72 hours. Anemia Panel: No results for input(s): VITAMINB12, FOLATE, FERRITIN, TIBC, IRON, RETICCTPCT in the last 72 hours. Sepsis Labs: No results for input(s): PROCALCITON, LATICACIDVEN in the last 168 hours.  Recent Results (from the past 240 hour(s))  Culture, body fluid-bottle     Status: None   Collection Time: 03/10/16  1:00 PM  Result Value Ref Range Status   Specimen Description FLUID LEFT PLEURAL COLLECTED BY DOCTOR BOLES  Final   Special Requests BOTTLES DRAWN AEROBIC AND ANAEROBIC 10CC EACH  Final   Culture NO GROWTH 5 DAYS  Final   Report Status 03/15/2016 FINAL  Final  Gram stain     Status: None   Collection Time: 03/10/16  1:00 PM  Result Value Ref Range Status   Specimen Description FLUID LEFT PLEURAL COLLECTED BY DOCTOR BOLES  Final   Special Requests NONE  Final   Gram Stain   Final    CYTOSPIN SMEAR WBC PRESENT, PREDOMINANTLY MONONUCLEAR NO ORGANISMS SEEN Performed at [facility]    Report Status  03/10/2016 FINAL  Final  MRSA PCR Screening     Status: None   Collection Time: 03/11/16 11:42 PM  Result Value Ref Range Status   MRSA by PCR NEGATIVE NEGATIVE Final    Comment:        The GeneXpert MRSA Assay (FDA approved for NASAL specimens only), is one component of a comprehensive MRSA colonization surveillance program. It is not intended to diagnose MRSA infection nor to guide or monitor treatment for MRSA infections.          Radiology Studies: No results found.      Scheduled Meds: . antiseptic oral rinse  7 mL Mouth Rinse BID  . apixaban  5 mg Oral BID  . carvedilol  12.5 mg Oral BID WC  . digoxin  0.125 mg Oral Daily  . furosemide  60 mg Intravenous Q8H  . insulin aspart  0-20 Units Subcutaneous TID WC  . LORazepam  0.5 mg Oral Daily  . methylPREDNISolone (SOLU-MEDROL) injection  40 mg Intravenous Q24H  . pantoprazole  40 mg Oral Daily  . piperacillin-tazobactam (ZOSYN)  IV  3.375 g Intravenous Q8H  . sodium chloride flush  3 mL Intravenous Q12H  . vancomycin  1,250 mg Intravenous Q24H   Continuous Infusions:    LOS: 8 days     CHIU, Scheryl Marten, MD [facility] Pager [phone]  If 7PM-7AM, please contact night-coverage [email or web address] Password [email or web address] 03/17/2016, 2:12 PM

## 2016-03-17 NOTE — Progress Notes (Signed)
Patient lethargic at dinner time.  Opens eyes, but does not follow commands as usual or speak.  Unable to do accurate neuro check.  Pupils are WNL.  VSS. MD made aware, orders placed for ABG.  Respiratory made aware.  Held coreg at this time as patient is not alert enough to take PO.

## 2016-03-18 ENCOUNTER — Encounter (HOSPITAL_COMMUNITY): Payer: Self-pay | Admitting: Primary Care

## 2016-03-18 ENCOUNTER — Inpatient Hospital Stay (HOSPITAL_COMMUNITY): Payer: Medicare Other

## 2016-03-18 DIAGNOSIS — I482 Chronic atrial fibrillation: Secondary | ICD-10-CM

## 2016-03-18 DIAGNOSIS — R410 Disorientation, unspecified: Secondary | ICD-10-CM | POA: Insufficient documentation

## 2016-03-18 DIAGNOSIS — Z515 Encounter for palliative care: Secondary | ICD-10-CM

## 2016-03-18 DIAGNOSIS — Z7189 Other specified counseling: Secondary | ICD-10-CM

## 2016-03-18 LAB — BLOOD GAS, ARTERIAL
Acid-Base Excess: 24.6 mmol/L — ABNORMAL HIGH (ref 0.0–2.0)
Bicarbonate: 47.2 mEq/L — ABNORMAL HIGH (ref 20.0–24.0)
DELIVERY SYSTEMS: POSITIVE
Drawn by: 23534
Expiratory PAP: 5
FIO2: 0.45
INSPIRATORY PAP: 20
O2 CONTENT: 45 L/min
O2 SAT: 93.1 %
PO2 ART: 69.3 mmHg — AB (ref 80.0–100.0)
pCO2 arterial: 88.3 mmHg (ref 35.0–45.0)
pH, Arterial: 7.383 (ref 7.350–7.450)

## 2016-03-18 LAB — CBC
HEMATOCRIT: 38.1 % (ref 36.0–46.0)
HEMOGLOBIN: 11.1 g/dL — AB (ref 12.0–15.0)
MCH: 29.8 pg (ref 26.0–34.0)
MCHC: 29.1 g/dL — AB (ref 30.0–36.0)
MCV: 102.4 fL — AB (ref 78.0–100.0)
Platelets: 151 10*3/uL (ref 150–400)
RBC: 3.72 MIL/uL — ABNORMAL LOW (ref 3.87–5.11)
RDW: 14.4 % (ref 11.5–15.5)
WBC: 7.1 10*3/uL (ref 4.0–10.5)

## 2016-03-18 LAB — BASIC METABOLIC PANEL
ANION GAP: 11 (ref 5–15)
BUN: 56 mg/dL — ABNORMAL HIGH (ref 6–20)
CHLORIDE: 80 mmol/L — AB (ref 101–111)
CO2: 50 mmol/L — ABNORMAL HIGH (ref 22–32)
Calcium: 8.1 mg/dL — ABNORMAL LOW (ref 8.9–10.3)
Creatinine, Ser: 1.13 mg/dL — ABNORMAL HIGH (ref 0.44–1.00)
GFR calc Af Amer: 51 mL/min — ABNORMAL LOW (ref >60)
GFR calc non Af Amer: 44 mL/min — ABNORMAL LOW (ref >60)
GLUCOSE: 192 mg/dL — AB (ref 65–99)
POTASSIUM: 4.3 mmol/L (ref 3.5–5.1)
Sodium: 141 mmol/L (ref 135–145)

## 2016-03-18 LAB — GLUCOSE, CAPILLARY
GLUCOSE-CAPILLARY: 144 mg/dL — AB (ref 65–99)
GLUCOSE-CAPILLARY: 230 mg/dL — AB (ref 65–99)
Glucose-Capillary: 195 mg/dL — ABNORMAL HIGH (ref 65–99)
Glucose-Capillary: 117 mg/dL — ABNORMAL HIGH (ref 65–99)

## 2016-03-18 MED ORDER — INSULIN GLARGINE 100 UNIT/ML ~~LOC~~ SOLN
7.0000 [IU] | Freq: Every day | SUBCUTANEOUS | Status: DC
  Administered 2016-03-18 – 2016-03-20 (×3): 7 [IU] via SUBCUTANEOUS
  Filled 2016-03-18 (×5): qty 0.07

## 2016-03-18 MED ORDER — SODIUM CHLORIDE 0.9 % IV BOLUS (SEPSIS)
500.0000 mL | Freq: Once | INTRAVENOUS | Status: AC
  Administered 2016-03-18: 500 mL via INTRAVENOUS

## 2016-03-18 MED ORDER — ACETAZOLAMIDE 250 MG PO TABS
250.0000 mg | ORAL_TABLET | Freq: Three times a day (TID) | ORAL | Status: AC
  Administered 2016-03-18 (×3): 250 mg via ORAL
  Filled 2016-03-18 (×3): qty 1

## 2016-03-18 NOTE — Progress Notes (Signed)
Inpatient Diabetes Program Recommendations  AACE/ADA: New Consensus Statement on Inpatient Glycemic Control (2015)  Target Ranges:  Prepandial:   less than 140 mg/dL      Peak postprandial:   less than 180 mg/dL (1-2 hours)      Critically ill patients:  140 - 180 mg/dL  Results for Kathy CuriaOWELL, Jaana L (MRN [id number]) as of 03/18/2016 08:33  Ref. Range 03/17/2016 08:14 03/17/2016 11:50 03/17/2016 16:41 03/17/2016 21:22 03/18/2016 07:57  Glucose-Capillary Latest Ref Range: 65-99 mg/dL 725221 (H) 366227 (H) 440130 (H) 220 (H) 195 (H)   Review of Glycemic Control  Diabetes history: DM2 Outpatient Diabetes medications: None (diet controlled) Current orders for Inpatient glycemic control: Novolog 0-20 units TID with meals  Inpatient Diabetes Program Recommendations: Insulin - Basal: If steroids are continued as ordered please consider ordering low dose basal inisulin. Recommend starting with Lantus 7 units daily (based on 73 kg x 0.1 units). Please note that if basal insulin is ordered as recommended it will need to be adjusted as steroids are tapered. Correction (SSI): Please order bedtime Novolog correction scale.  Thank you, Orlando PennerMarie Yun Gutierrez, RN, MSN, CDE Diabetes Coordinator Inpatient Diabetes Program [phone] (Team Pager from 8am to 5pm) [phone] ([facility] office) [phone] [facility] office) [phone] [facility] office)

## 2016-03-18 NOTE — Care Management Note (Signed)
Case Management Note  Patient Details  Name: [name] MRN: [id number] Date of Birth: 04/11/1931  Expected Discharge Date:  03/11/16               Expected Discharge Plan:  Skilled Nursing Facility  In-House Referral:  Clinical Social Work, Hospice / Palliative Care  Discharge planning Services  CM Consult  Post Acute Care Choice:  NA Choice offered to:  NA  DME Arranged:    DME Agency:     HH Arranged:    HH Agency:     Status of Service:  Completed, signed off  Medicare Important Message Given:  Yes Date Medicare IM Given:    Medicare IM give by:    Date Additional Medicare IM Given:    Additional Medicare Important Message give by:     If discussed at Long Length of Stay Meetings, dates discussed:    Additional Comments: Pt transferred to increased level of care. Palliative consult placed. Will cont to follow.   Malcolm Metrohildress, Makar Slatter Demske, RN 03/18/2016, 11:07 AM

## 2016-03-18 NOTE — Progress Notes (Signed)
PT Cancellation Note  Patient Details Name: [name] MRN: [id number] DOB: 08/22/1931   Cancelled Treatment:    Reason Eval/Treat Not Completed: Patient not medically ready: Pt currently on BiPAP.  Will check back tomorrow.    Beth Ciarrah Rae, PT, DPT X: [phone]   03/18/2016, 1:35 PM

## 2016-03-18 NOTE — Progress Notes (Signed)
PROGRESS NOTE    [name]  MWU:[id number] DOB: 02/06/1931 DOA: 03/09/2016 PCP: Dwana MelenaZack Hall, MD   Brief Narrative:  82 y.o. female with a history of atrial fibrillation with a chads score of 5 on aspirin and not on anticoagulation secondary to history of GI bleed, coronary artery disease with stenting of the RCA and a history of inferior wall MI in 1999, hypertension, type 2 diabetes, hyperlipidemia.She was recently diagnosed with atrial fibrillation in the outpatient setting and was being worked up by cardiology.  He presented to the hospital with weakness and fatigue, found to be in CHF with bilateral pleural effusion, cardiology and pulmonary were consulted, she underwent 1 L thoracentesis and left lung fluid removal on 03/10/2016 which appears to be transudate due to CHF. However her echogram shows a depressed EF with wall motion of normality.   Assessment & Plan:   Principal Problem:   Acute on chronic respiratory failure with hypoxia (HCC) Active Problems:   Type 2 diabetes mellitus (HCC)   COPD (chronic obstructive pulmonary disease) (HCC)   Pleural effusion on left   Atrial fibrillation (HCC)   Status post thoracentesis   Pleural effusion, bilateral   Atrial fibrillation with RVR (HCC)   Confusion   SOB (shortness of breath)   1. Acute on chronic hypoxic respiratory failure due to bilateral pleural effusions -left more than right.  -Secondary to acute on chronic combined systolic and diastolic CHF EF 72%45%. -She is status post left-sided thoracentesis with 1 L transudative fluid removal on 03/10/2016 - Pt became developed hypercarbic failure, requiring transfer to SDU and bipap support on 5/23 -CXR with mod-large L sided effusion. Pt on 2LNC and appears comfortable -Continue on steroids -Patient had been without BiPAP over the weekend, reportedly patient had been refusing. ABG yesterday afternoon suggestive of hypercarbia. Patient has been since continued on continuous  BiPAP with improvement of her ABG. -Further diuresis now on hold -Given recent decompensation, palliative care has been consulted for goals of care  2. New diagnosis of persistent atrial fibrillation.  -[place] vasc 2 score of close to 5.  -Currently remains on Cardizem and aspirin,  -On eliquis  3. New diagnosis of combined systolic and diastolic CHF EF 53%45% and with wall motion of normality  -Cardiology following. -Continue diuresis as tolerated per above  4. Diet-controlled type 2 diabetes mellitus -Continue sliding scale coverage as needed -Insulin currently on resistance scale as patient is on concurrent steroids.   DVT prophylaxis: Heparin subcutaneous Code Status: Full Family Communication: Patient in room, family at bedside Disposition Plan: Skilled nursing facility, timing uncertain   Consultants:   Pulmonary  Cardiology  Procedures:  TTE - with LVH, EF 45% and diffuse wall motion abnormalities  Left-sided thoracentesis with 1 L transudative fluid removed on 03/10/2016  Antimicrobials:  Anti-infectives    Start     Dose/Rate Route Frequency Ordered Stop   03/17/16 0800  vancomycin (VANCOCIN) 1,250 mg in sodium chloride 0.9 % 250 mL IVPB     1,250 mg 166.7 mL/hr over 90 Minutes Intravenous Every 24 hours 03/17/16 0521     03/17/16 0700  vancomycin (VANCOCIN) 1,250 mg in sodium chloride 0.9 % 250 mL IVPB  Status:  Discontinued     1,250 mg 166.7 mL/hr over 90 Minutes Intravenous Every 24 hours 03/16/16 2219 03/17/16 0521   03/12/16 2100  vancomycin (VANCOCIN) IVPB 750 mg/150 ml premix  Status:  Discontinued     750 mg 150 mL/hr over 60 Minutes Intravenous Every  12 hours 03/12/16 1313 03/16/16 2219   03/12/16 1000  vancomycin (VANCOCIN) 1,500 mg in sodium chloride 0.9 % 500 mL IVPB     1,500 mg 250 mL/hr over 120 Minutes Intravenous  Once 03/12/16 0802 03/12/16 1114   03/12/16 0900  piperacillin-tazobactam (ZOSYN) IVPB 3.375 g     3.375 g 12.5 mL/hr over 240  Minutes Intravenous Every 8 hours 03/12/16 0802            Subjective: Patient asking about drinking while on BiPAP  Objective: Filed Vitals:   03/18/16 1100 03/18/16 1200 03/18/16 1210 03/18/16 1215  BP: 109/59 113/66    Pulse: 86 84 83   Temp:    97.2 F (36.2 C)  TempSrc:    Axillary  Resp: 14 14 14    Height:      Weight:      SpO2: 98% 98% 98%     Intake/Output Summary (Last 24 hours) at 03/18/16 1328 Last data filed at 03/18/16 0847  Gross per 24 hour  Intake    525 ml  Output   1000 ml  Net   -475 ml   Filed Weights   03/12/16 0500 03/17/16 1800 03/18/16 0500  Weight: 78.4 kg (172 lb 13.5 oz) 73.9 kg (162 lb 14.7 oz) 73.3 kg (161 lb 9.6 oz)    Examination:  General exam: Appears calm and comfortable, Lying in bed, BiPAP in place Respiratory system: Clear to auscultation. Respiratory effort normal, No wheezing Cardiovascular system: S1 & S2 heard, RRR.  Gastrointestinal system: Abdomen is nondistended, soft and nontender. No organomegaly or masses felt. Normal bowel sounds Central nervous system: Alert and oriented. No focal neurological deficits. Extremities: Symmetric 5 x 5 power. Skin: No rashes, lesions  Psychiatry: Judgement and insight appear normal. Normal mood and affect    Data Reviewed: I have personally reviewed following labs and imaging studies  CBC:  Recent Labs Lab 03/12/16 0412 03/15/16 0507 03/18/16 0441  WBC 5.0 4.1 7.1  HGB 12.2 10.9* 11.1*  HCT 42.7 35.6* 38.1  MCV 104.1* 98.3 102.4*  PLT 127* 119* 151   Basic Metabolic Panel:  Recent Labs Lab 03/14/16 0518 03/15/16 0507 03/16/16 0633 03/17/16 0557 03/18/16 0441  NA 138 139 138 138 141  K 3.4* 3.7 3.4* 3.9 4.3  CL 81* 81* 79* 81* 80*  CO2 49* 50* 50* 49* 50*  GLUCOSE 230* 251* 263* 269* 192*  BUN 36* 45* 48* 45* 56*  CREATININE 0.75 0.79 0.84 0.87 1.13*  CALCIUM 8.3* 8.4* 8.3* 8.1* 8.1*  MG 1.9  --   --   --   --    GFR: Estimated Creatinine Clearance: 37.6  mL/min (by C-G formula based on Cr of 1.13). Liver Function Tests: No results for input(s): AST, ALT, ALKPHOS, BILITOT, PROT, ALBUMIN in the last 168 hours. No results for input(s): LIPASE, AMYLASE in the last 168 hours. No results for input(s): AMMONIA in the last 168 hours. Coagulation Profile: No results for input(s): INR, PROTIME in the last 168 hours. Cardiac Enzymes: No results for input(s): CKTOTAL, CKMB, CKMBINDEX, TROPONINI in the last 168 hours. BNP (last 3 results) No results for input(s): PROBNP in the last 8760 hours. HbA1C: No results for input(s): HGBA1C in the last 72 hours. CBG:  Recent Labs Lab 03/17/16 1150 03/17/16 1641 03/17/16 2122 03/18/16 0757 03/18/16 1220  GLUCAP 227* 130* 220* 195* 144*   Lipid Profile: No results for input(s): CHOL, HDL, LDLCALC, TRIG, CHOLHDL, LDLDIRECT in the last 72 hours. Thyroid  Function Tests: No results for input(s): TSH, T4TOTAL, FREET4, T3FREE, THYROIDAB in the last 72 hours. Anemia Panel: No results for input(s): VITAMINB12, FOLATE, FERRITIN, TIBC, IRON, RETICCTPCT in the last 72 hours. Sepsis Labs: No results for input(s): PROCALCITON, LATICACIDVEN in the last 168 hours.  Recent Results (from the past 240 hour(s))  Culture, body fluid-bottle     Status: None   Collection Time: 03/10/16  1:00 PM  Result Value Ref Range Status   Specimen Description FLUID LEFT PLEURAL COLLECTED BY DOCTOR BOLES  Final   Special Requests BOTTLES DRAWN AEROBIC AND ANAEROBIC 10CC EACH  Final   Culture NO GROWTH 5 DAYS  Final   Report Status 03/15/2016 FINAL  Final  Gram stain     Status: None   Collection Time: 03/10/16  1:00 PM  Result Value Ref Range Status   Specimen Description FLUID LEFT PLEURAL COLLECTED BY DOCTOR BOLES  Final   Special Requests NONE  Final   Gram Stain   Final    CYTOSPIN SMEAR WBC PRESENT, PREDOMINANTLY MONONUCLEAR NO ORGANISMS SEEN Performed at [facility]    Report Status 03/10/2016 FINAL  Final    MRSA PCR Screening     Status: None   Collection Time: 03/11/16 11:42 PM  Result Value Ref Range Status   MRSA by PCR NEGATIVE NEGATIVE Final    Comment:        The GeneXpert MRSA Assay (FDA approved for NASAL specimens only), is one component of a comprehensive MRSA colonization surveillance program. It is not intended to diagnose MRSA infection nor to guide or monitor treatment for MRSA infections.          Radiology Studies: Dg Chest Port 1 View  03/18/2016  CLINICAL DATA:  Difficulty breathing EXAM: PORTABLE CHEST 1 VIEW COMPARISON:  03/15/2016 FINDINGS: Cardiac shadow is stable. Bilateral pleural effusions are again seen and stable. Left basilar consolidation is again noted. Aortic calcifications are again seen. No acute bony abnormality is noted. IMPRESSION: Bilateral pleural effusions left greater than right. Left basilar consolidation is noted. Electronically Signed   By: Alcide Clever M.D.   On: 03/18/2016 07:12        Scheduled Meds: . acetaZOLAMIDE  250 mg Oral TID  . antiseptic oral rinse  7 mL Mouth Rinse BID  . antiseptic oral rinse  7 mL Mouth Rinse q12n4p  . apixaban  5 mg Oral BID  . carvedilol  12.5 mg Oral BID WC  . chlorhexidine  15 mL Mouth Rinse BID  . digoxin  0.125 mg Oral Daily  . insulin aspart  0-20 Units Subcutaneous TID WC  . insulin glargine  7 Units Subcutaneous Daily  . LORazepam  0.5 mg Oral Daily  . methylPREDNISolone (SOLU-MEDROL) injection  60 mg Intravenous Q8H  . pantoprazole  40 mg Oral Daily  . piperacillin-tazobactam (ZOSYN)  IV  3.375 g Intravenous Q8H  . sodium chloride flush  3 mL Intravenous Q12H  . vancomycin  1,250 mg Intravenous Q24H   Continuous Infusions:    LOS: 9 days     Jaben Benegas, Scheryl Marten, MD [facility] Pager [phone]  If 7PM-7AM, please contact night-coverage [email or web address] Password [email or web address] 03/18/2016, 1:28 PM

## 2016-03-18 NOTE — Consult Note (Signed)
Consultation Note Date: 03/18/2016   Patient Name: [name]  DOB: 1930/11/23  MRN: [id number]  Age / Sex: 82 y.o., female  PCP: Benita Stabile, MD Referring Physician: Jerald Kief, MD  Reason for Consultation: Disposition, Establishing goals of care and Psychosocial/spiritual support  HPI/Patient Profile: 82 y.o. female  with past medical history of Atrial fibrillation with the chads score 5 on aspirin and not on anticoagulation due to a history of G.I. bleed, coronary artery disease with stenting of the RCA and inferior wall MI in 1999, hypertension, type II diabetes, hyperlipidemia, COPD (oxygen dependent) admitted on 03/09/2016 with a field, shortness of breath, weakness.   Clinical Assessment and Goals of Care: [name] is resting quietly in bed (nasal canula, now). Family at bedside include, daughter [name], sister in law [place], neighbor [place].  She shares that it's been about one year since she was in the hospital last. We talk about her home life.  She shares that she has been able to do her own bathing and dressing until about 1 month ago, but that she is become, "so week". She shares that she lives alone. [name] shares that her appetite has been okay until this "recent setback".  We talk about her breathing, and she states that she feels she is "breathing fine". She will often look to her daughter [name] when asked questions. She seems very tired today.  We talk about going to a rehabilitation center after this hospitalization to get her strength back. Family chooses [facility] choice, [facility] 2[facility] choice, [facility] 3rd choice.  We talk about advanced directives and healthcare power of attorney. See below.  Healthcare power of attorney OTHER, [name] is able to make her own decisions at this time. She often will look to her daughter [name] to  make decisions for her. [name] shares that [name] has created advanced directives and power of attorney papers. She will find and review these papers tonight. [name] states she is unsure if she has medical or durable power of attorney. Will bring this paperwork with her to the hospital tomorrow.   SUMMARY OF RECOMMENDATIONS   continue to treat the treatable, [name] agrees to wear BiPAP at night. She states that BiPAP does not make her feel like she is suffocating, she has no problem using this nightly and PRN.  Code Status/Advance Care Planning:  Full code, at this time. [name] looks to [place] when I ask her about her advanced directives.  [name] states that her mother has created an advanced directives document. She shares that her mother is not against life-support for a short-term basis, but she would not want to live dependent on machines.   Symptom Management:   per hospitalist  Palliative Prophylaxis:   Oral Care and Turn Reposition  Additional Recommendations (Limitations, Scope, Preferences):  No limitations at this time.  Psycho-social/Spiritual:   Desire for further Chaplaincy support: yes, ongoing.   Additional Recommendations: Caregiving  Support/Resources, Education on  Hospice and ICU Family Guide  Prognosis:   < 6 months, likely based on chronic disease burden related to COPD, and coronary dysfunction.  Discharge Planning: SNF for rehab. [facility]       Primary Diagnoses: Present on Admission:  . Acute on chronic respiratory failure with hypoxia (HCC) . Pleural effusion on left . Atrial fibrillation (HCC) . COPD (chronic obstructive pulmonary disease) (HCC)  I have reviewed the medical record, interviewed the patient and family, and examined the patient. The following aspects are pertinent.  Past Medical History  Diagnosis Date  . Essential hypertension, benign   . Hyperlipidemia   . History of  GI bleed   . COPD (chronic obstructive pulmonary disease) (HCC)     Oxygen dependent  . Old inferior wall myocardial infarction     [facility]  . Asthma   . Type 2 diabetes mellitus (HCC)   . Coronary atherosclerosis of native coronary artery     BMS x 3 RCA 1999 [facility]), PTCRA RCA 01/2000, LVEF 51% by MV 2004   Social History   Social History  . Marital Status: Widowed    Spouse Name: N/A  . Number of Children: N/A  . Years of Education: N/A   Social History Main Topics  . Smoking status: Former Smoker -- 2.00 packs/day for 40 years    Types: Cigarettes    Quit date: 01/13/1998  . Smokeless tobacco: Never Used  . Alcohol Use: No  . Drug Use: No  . Sexual Activity: Not Asked   Other Topics Concern  . None   Social History Narrative   Family History  Problem Relation Age of Onset  . Diabetes Mother   . Diabetes Father   . Heart disease Father   . Cancer Sister   . Cancer Brother   . Diabetes Brother    Scheduled Meds: . acetaZOLAMIDE  250 mg Oral TID  . antiseptic oral rinse  7 mL Mouth Rinse BID  . antiseptic oral rinse  7 mL Mouth Rinse q12n4p  . apixaban  5 mg Oral BID  . carvedilol  12.5 mg Oral BID WC  . chlorhexidine  15 mL Mouth Rinse BID  . digoxin  0.125 mg Oral Daily  . insulin aspart  0-20 Units Subcutaneous TID WC  . insulin glargine  7 Units Subcutaneous Daily  . LORazepam  0.5 mg Oral Daily  . methylPREDNISolone (SOLU-MEDROL) injection  60 mg Intravenous Q8H  . pantoprazole  40 mg Oral Daily  . piperacillin-tazobactam (ZOSYN)  IV  3.375 g Intravenous Q8H  . sodium chloride flush  3 mL Intravenous Q12H  . vancomycin  1,250 mg Intravenous Q24H   Continuous Infusions:  PRN Meds:.acetaminophen **OR** [DISCONTINUED] acetaminophen, haloperidol lactate, ipratropium-albuterol, labetalol, nitroGLYCERIN, [DISCONTINUED] ondansetron **OR** ondansetron (ZOFRAN) IV, polyethylene glycol Medications Prior to Admission:  Prior to Admission medications     Medication Sig Start Date End Date Taking? Authorizing Provider  acetaminophen (TYLENOL) 500 MG tablet Take 1,000 mg by mouth every 6 (six) hours as needed for mild pain.   Yes Historical Provider, MD  aspirin EC 81 MG tablet Take 81 mg by mouth daily.   Yes Historical Provider, MD  diltiazem (CARDIZEM CD) 180 MG 24 hr capsule Take 1 capsule (180 mg total) by mouth daily. 02/27/16  Yes Jonelle SidleSamuel G McDowell, MD  furosemide (LASIX) 40 MG tablet Take 40 mg by mouth daily.     Yes Historical Provider, MD  ipratropium-albuterol (  DUONEB) 0.5-2.5 (3) MG/3ML SOLN Take 3 mLs by nebulization as needed.     Yes Historical Provider, MD  LORazepam (ATIVAN) 1 MG tablet Take 0.5 mg by mouth daily.  04/27/12  Yes Historical Provider, MD  omeprazole (PRILOSEC) 20 MG capsule Take 20 mg by mouth at bedtime.    Yes Historical Provider, MD  atorvastatin (LIPITOR) 10 MG tablet Take 10 mg by mouth daily. Reported on 12/04/2015    Historical Provider, MD  nitroGLYCERIN (NITROSTAT) 0.4 MG SL tablet Place 1 tablet (0.4 mg total) under the tongue every 5 (five) minutes as needed for chest pain. 04/24/14   Jonelle Sidle, MD  NON FORMULARY Pt on 2.5 liters of O2 daily    Historical Provider, MD   Allergies  Allergen Reactions  . Codeine Nausea Only  . Heparin Other (See Comments)    Causes internal bleeding    Review of Systems  Unable to perform ROS: Acuity of condition    Physical Exam  Constitutional: No distress.  HENT:  Head: Normocephalic and atraumatic.  Cardiovascular: Normal rate.   A fib  Pulmonary/Chest: Effort normal. No respiratory distress.  Abdominal: Soft. She exhibits no distension.  Neurological: She is alert.  Skin: Skin is warm and dry.  Nursing note and vitals reviewed.   Vital Signs: BP 109/61 mmHg  Pulse 88  Temp(Src) 97.2 F (36.2 C) (Axillary)  Resp 10  Ht 5\' 4"  (1.626 m)  Wt 73.3 kg (161 lb 9.6 oz)  BMI 27.72 kg/m2  SpO2 97% Pain Assessment: No/denies pain POSS *See Group  Information*: 1-Acceptable,Awake and alert Pain Score: 0-No pain   SpO2: SpO2: 97 % O2 Device:SpO2: 97 % O2 Flow Rate: .O2 Flow Rate (L/min): 3 L/min  IO: Intake/output summary:  Intake/Output Summary (Last 24 hours) at 03/18/16 1541 Last data filed at 03/18/16 1400  Gross per 24 hour  Intake    525 ml  Output   1300 ml  Net   -775 ml    LBM: Last BM Date: 03/18/16 Baseline Weight: Weight: 75.751 kg (167 lb) Most recent weight: Weight: 73.3 kg (161 lb 9.6 oz)     Palliative Assessment/Data:   Flowsheet Rows        Most Recent Value   Intake Tab    Referral Department  Hospitalist   Unit at Time of Referral  ICU   Palliative Care Primary Diagnosis  Pulmonary   Date Notified  03/18/16   Palliative Care Type  New Palliative care   Reason for referral  Clarify Goals of Care, Psychosocial or Spiritual support   Date of Admission  03/09/16   Date first seen by Palliative Care  03/18/16   # of days Palliative referral response time  0 Day(s)   # of days IP prior to Palliative referral  9   Clinical Assessment    Palliative Performance Scale Score  20%   Pain Max last 24 hours  Not able to report   Pain Min Last 24 hours  Not able to report   Dyspnea Max Last 24 Hours  Not able to report   Dyspnea Min Last 24 hours  Not able to report   Psychosocial & Spiritual Assessment    Palliative Care Outcomes    Patient/Family meeting held?  Yes   Who was at the meeting?  Patient, daughter [name], sister in law Malachi Bonds, neighbor [facility]   Palliative Care Outcomes  Clarified goals of care, Provided psychosocial or spiritual support   Palliative Care  follow-up planned  -- [Follow-up at [facility]]      Time In: 1440 Time Out: 1520 Time Total: 40 minutes Greater than 50%  of this time was spent counseling and coordinating care related to the above assessment and plan.  Signed by: Katheran Awe, NP   Please contact Palliative Medicine Team phone at [phone] for questions and concerns.    For individual provider: See Loretha Stapler

## 2016-03-18 NOTE — Progress Notes (Addendum)
Subjective: I am resuming my participation in her care. She has had significant problems with markedly elevated PCO2. She has been sleepy. This morning her blood gasis better but her PCO2 was still in the 90s.  Objective: Vital signs in last 24 hours: Temp:  [97 F (36.1 C)-97.6 F (36.4 C)] 97 F (36.1 C) (05/30 0400) Pulse Rate:  [53-122] 111 (05/30 0600) Resp:  [13-22] 16 (05/30 0600) BP: (84-129)/(53-76) 117/76 mmHg (05/30 0600) SpO2:  [90 %-100 %] 98 % (05/30 0600) FiO2 (%):  [40 %] 40 % (05/29 2323) Weight:  [73.3 kg (161 lb 9.6 oz)-73.9 kg (162 lb 14.7 oz)] 73.3 kg (161 lb 9.6 oz) (05/30 0500) Weight change:  Last BM Date: 03/17/16  Intake/Output from previous day: 05/29 0701 - 05/30 0700 In: 765 [P.O.:240; IV Piggyback:525] Out: 975 [Urine:975]  PHYSICAL EXAM General appearance: Sleepy on BiPAP. Resp: Decreased breath sounds Cardio: irregularly irregular rhythm GI: soft, non-tender; bowel sounds normal; no masses,  no organomegaly Extremities: extremities normal, atraumatic, no cyanosis or edema  Lab Results:  Results for orders placed or performed during the hospital encounter of 03/09/16 (from the past 48 hour(s))  Glucose, capillary     Status: Abnormal   Collection Time: 03/16/16  7:59 AM  Result Value Ref Range   Glucose-Capillary 274 (H) 65 - 99 mg/dL  Glucose, capillary     Status: Abnormal   Collection Time: 03/16/16 12:02 PM  Result Value Ref Range   Glucose-Capillary 293 (H) 65 - 99 mg/dL  Glucose, capillary     Status: Abnormal   Collection Time: 03/16/16  4:28 PM  Result Value Ref Range   Glucose-Capillary 217 (H) 65 - 99 mg/dL   Comment 1 Notify RN    Comment 2 Document in Chart   Vancomycin, trough     Status: Abnormal   Collection Time: 03/16/16  8:17 PM  Result Value Ref Range   Vancomycin Tr 24 (H) 10.0 - 20.0 ug/mL  Glucose, capillary     Status: Abnormal   Collection Time: 03/16/16  8:41 PM  Result Value Ref Range   Glucose-Capillary  317 (H) 65 - 99 mg/dL   Comment 1 Notify RN   Basic metabolic panel     Status: Abnormal   Collection Time: 03/17/16  5:57 AM  Result Value Ref Range   Sodium 138 135 - 145 mmol/L   Potassium 3.9 3.5 - 5.1 mmol/L   Chloride 81 (L) 101 - 111 mmol/L   CO2 49 (H) 22 - 32 mmol/L   Glucose, Bld 269 (H) 65 - 99 mg/dL   BUN 45 (H) 6 - 20 mg/dL   Creatinine, Ser 0.87 0.44 - 1.00 mg/dL   Calcium 8.1 (L) 8.9 - 10.3 mg/dL   GFR calc non Af Amer >60 >60 mL/min   GFR calc Af Amer >60 >60 mL/min    Comment: (NOTE) The eGFR has been calculated using the CKD EPI equation. This calculation has not been validated in all clinical situations. eGFR's persistently <60 mL/min signify possible Chronic Kidney Disease.    Anion gap 8 5 - 15  Glucose, capillary     Status: Abnormal   Collection Time: 03/17/16  8:14 AM  Result Value Ref Range   Glucose-Capillary 221 (H) 65 - 99 mg/dL  Glucose, capillary     Status: Abnormal   Collection Time: 03/17/16 11:50 AM  Result Value Ref Range   Glucose-Capillary 227 (H) 65 - 99 mg/dL  Digoxin level  Status: None   Collection Time: 03/17/16 12:32 PM  Result Value Ref Range   Digoxin Level 0.9 0.8 - 2.0 ng/mL  Glucose, capillary     Status: Abnormal   Collection Time: 03/17/16  4:41 PM  Result Value Ref Range   Glucose-Capillary 130 (H) 65 - 99 mg/dL  Blood gas, arterial     Status: Abnormal   Collection Time: 03/17/16  5:52 PM  Result Value Ref Range   O2 Content 2.0 L/min   Delivery systems NASAL CANNULA    pH, Arterial 7.223 (L) 7.350 - 7.450   pCO2 arterial  35.0 - 45.0 mmHg    CRITICAL RESULT CALLED TO, READ BACK BY AND VERIFIED WITH:    Comment: STEPHEN CHIU,MD ON 03/17/2016 BY PEVIANY LAWSON,RRT AT 7591. ABOVE REPORTABLE RANGE CRITICAL RESULT CALLED TO, READ BACK BY AND VERIFIED WITH: STEPHEN CHIU,MD ON 03/17/2016 BY PEVIANY LAWSON,RRT [id number].    pO2, Arterial 65.1 (L) 80.0 - 100.0 mmHg   O2 Saturation 88.8 %   Collection site RIGHT RADIAL     Drawn by [phone]    Sample type ARTERIAL    Allens test (pass/fail) PASS PASS  Glucose, capillary     Status: Abnormal   Collection Time: 03/17/16  9:22 PM  Result Value Ref Range   Glucose-Capillary 220 (H) 65 - 99 mg/dL  Basic metabolic panel     Status: Abnormal   Collection Time: 03/18/16  4:41 AM  Result Value Ref Range   Sodium 141 135 - 145 mmol/L   Potassium 4.3 3.5 - 5.1 mmol/L   Chloride 80 (L) 101 - 111 mmol/L   CO2 50 (H) 22 - 32 mmol/L   Glucose, Bld 192 (H) 65 - 99 mg/dL   BUN 56 (H) 6 - 20 mg/dL   Creatinine, Ser 1.13 (H) 0.44 - 1.00 mg/dL   Calcium 8.1 (L) 8.9 - 10.3 mg/dL   GFR calc non Af Amer 44 (L) >60 mL/min   GFR calc Af Amer 51 (L) >60 mL/min    Comment: (NOTE) The eGFR has been calculated using the CKD EPI equation. This calculation has not been validated in all clinical situations. eGFR's persistently <60 mL/min signify possible Chronic Kidney Disease.    Anion gap 11 5 - 15  CBC     Status: Abnormal   Collection Time: 03/18/16  4:41 AM  Result Value Ref Range   WBC 7.1 4.0 - 10.5 K/uL   RBC 3.72 (L) 3.87 - 5.11 MIL/uL   Hemoglobin 11.1 (L) 12.0 - 15.0 g/dL   HCT 38.1 36.0 - 46.0 %   MCV 102.4 (H) 78.0 - 100.0 fL   MCH 29.8 26.0 - 34.0 pg   MCHC 29.1 (L) 30.0 - 36.0 g/dL   RDW 14.4 11.5 - 15.5 %   Platelets 151 150 - 400 K/uL  Blood gas, arterial     Status: Abnormal (Preliminary result)   Collection Time: 03/18/16  4:45 AM  Result Value Ref Range   FIO2 100.00    Delivery systems BILEVEL POSITIVE AIRWAY PRESSURE    LHR 14 resp/min   Inspiratory PAP 20    Expiratory PAP 5    pH, Arterial 7.358 7.350 - 7.450   pCO2 arterial 94.5 (HH) 35.0 - 45.0 mmHg    Comment: CRITICAL RESULT CALLED TO, READ BACK BY AND VERIFIED WITH:  CINDY PHILLIPS,RN BY K KNICK RRT,RCP ON 03/18/2016 AT 0452    pO2, Arterial 141.0 (H) 80.0 - 100.0 mmHg   Bicarbonate 47.0 (  H) 20.0 - 24.0 mEq/L   TCO2 15.2 0 - 100 mmol/L   Acid-Base Excess 24.8 (H) 0.0 - 2.0 mmol/L    Acid-base deficit PENDING 0.0 - 2.0 mmol/L   O2 Saturation 99.0 %   Collection site RIGHT RADIAL    Drawn by [id number]    Sample type ARTERIAL    Allens test (pass/fail) PASS PASS    ABGS  Recent Labs  03/18/16 0445  PHART 7.358  PO2ART 141.0*  TCO2 15.2  HCO3 47.0*   CULTURES Recent Results (from the past 240 hour(s))  Culture, body fluid-bottle     Status: None   Collection Time: 03/10/16  1:00 PM  Result Value Ref Range Status   Specimen Description FLUID LEFT PLEURAL COLLECTED BY DOCTOR BOLES  Final   Special Requests BOTTLES DRAWN AEROBIC AND ANAEROBIC 10CC EACH  Final   Culture NO GROWTH 5 DAYS  Final   Report Status 03/15/2016 FINAL  Final  Gram stain     Status: None   Collection Time: 03/10/16  1:00 PM  Result Value Ref Range Status   Specimen Description FLUID LEFT PLEURAL COLLECTED BY DOCTOR BOLES  Final   Special Requests NONE  Final   Gram Stain   Final    CYTOSPIN SMEAR WBC PRESENT, PREDOMINANTLY MONONUCLEAR NO ORGANISMS SEEN Performed at [facility]    Report Status 03/10/2016 FINAL  Final  MRSA PCR Screening     Status: None   Collection Time: 03/11/16 11:42 PM  Result Value Ref Range Status   MRSA by PCR NEGATIVE NEGATIVE Final    Comment:        The GeneXpert MRSA Assay (FDA approved for NASAL specimens only), is one component of a comprehensive MRSA colonization surveillance program. It is not intended to diagnose MRSA infection nor to guide or monitor treatment for MRSA infections.    Studies/Results: Dg Chest Port 1 View  03/18/2016  CLINICAL DATA:  Difficulty breathing EXAM: PORTABLE CHEST 1 VIEW COMPARISON:  03/15/2016 FINDINGS: Cardiac shadow is stable. Bilateral pleural effusions are again seen and stable. Left basilar consolidation is again noted. Aortic calcifications are again seen. No acute bony abnormality is noted. IMPRESSION: Bilateral pleural effusions left greater than right. Left basilar consolidation is noted.  Electronically Signed   By: Inez Catalina M.D.   On: 03/18/2016 07:12    Medications:  Prior to Admission:  Prescriptions prior to admission  Medication Sig Dispense Refill Last Dose  . acetaminophen (TYLENOL) 500 MG tablet Take 1,000 mg by mouth every 6 (six) hours as needed for mild pain.   03/08/2016  . aspirin EC 81 MG tablet Take 81 mg by mouth daily.   03/09/2016  . diltiazem (CARDIZEM CD) 180 MG 24 hr capsule Take 1 capsule (180 mg total) by mouth daily. 90 capsule 3 03/09/2016  . furosemide (LASIX) 40 MG tablet Take 40 mg by mouth daily.     03/09/2016  . ipratropium-albuterol (DUONEB) 0.5-2.5 (3) MG/3ML SOLN Take 3 mLs by nebulization as needed.     Past Month  . LORazepam (ATIVAN) 1 MG tablet Take 0.5 mg by mouth daily.    03/09/2016  . omeprazole (PRILOSEC) 20 MG capsule Take 20 mg by mouth at bedtime.    03/08/2016  . atorvastatin (LIPITOR) 10 MG tablet Take 10 mg by mouth daily. Reported on 12/04/2015   Taking  . nitroGLYCERIN (NITROSTAT) 0.4 MG SL tablet Place 1 tablet (0.4 mg total) under the tongue every 5 (five) minutes as  needed for chest pain. 25 tablet 3 Never  . NON FORMULARY Pt on 2.5 liters of O2 daily   Taking   Scheduled: . acetaZOLAMIDE  250 mg Oral TID  . antiseptic oral rinse  7 mL Mouth Rinse BID  . antiseptic oral rinse  7 mL Mouth Rinse q12n4p  . apixaban  5 mg Oral BID  . carvedilol  12.5 mg Oral BID WC  . chlorhexidine  15 mL Mouth Rinse BID  . digoxin  0.125 mg Oral Daily  . insulin aspart  0-20 Units Subcutaneous TID WC  . LORazepam  0.5 mg Oral Daily  . methylPREDNISolone (SOLU-MEDROL) injection  60 mg Intravenous Q8H  . pantoprazole  40 mg Oral Daily  . piperacillin-tazobactam (ZOSYN)  IV  3.375 g Intravenous Q8H  . sodium chloride  500 mL Intravenous Once  . sodium chloride flush  3 mL Intravenous Q12H  . vancomycin  1,250 mg Intravenous Q24H   Continuous:  EXM:[id number] **OR** [DISCONTINUED] acetaminophen, haloperidol lactate,  ipratropium-albuterol, labetalol, nitroGLYCERIN, [DISCONTINUED] ondansetron **OR** ondansetron (ZOFRAN) IV, polyethylene glycol  Assesment: She has acute on chronic hypoxic and hypercapnic respiratory failure. She had what appeared to be heart failure as well. She was diuresed and has metabolic alkalosis from that. She has markedly elevated PCO2 and is actually pretty well compensated. I think it's okay to hold the Lasix and give Diamox but that may not improve her PCO2. She is going to need to remain on BiPAP at least at night. Chest x-ray today shows left lower lobe infiltrate and the effusions are still present but she does not have any other evidence of volume overload now Principal Problem:   Acute on chronic respiratory failure with hypoxia (HCC) Active Problems:   Type 2 diabetes mellitus (HCC)   COPD (chronic obstructive pulmonary disease) (HCC)   Pleural effusion on left   Atrial fibrillation (HCC)   Status post thoracentesis   Pleural effusion, bilateral   Atrial fibrillation with RVR (HCC)   Confusion   SOB (shortness of breath)    Plan: Continue BiPAP. Repeat blood gas later today.    LOS: 9 days   Kenyona Rena L 03/18/2016, 7:22 AM

## 2016-03-18 NOTE — Progress Notes (Addendum)
I was called with ABG:  7.35/95/pOx= 141. Her bicarb has shown hypochloremic metabolic alkalosis over the past few days, likely from diuresis with resulting contraction alkalosis.  Her I and O confirmed this, as she had negative about 9 L over the past 4 days.  Her ABG shows mixed respiratory acidosis with metabolic alkalosis.  She is alert and stable. Will hold Lasix today.  Give 500 cc NS over 4 hours, and give Diamox for 3 doses to correct metabolic derrangements.   Thanks,  Houston SirenPeter Wynona Duhamel, MD FACP.  Hospital Medicine.

## 2016-03-18 NOTE — Progress Notes (Signed)
Primary Cardiologist:MCDowell, Samuel   Cardiology Specific Problem List: 1. Atrial fibrillation-CHADS VASC Score of 5-No anticoagulation GIB. 2. Cardiomyopathy: EF 40-45%. 3. Bilateral pleural effusions -s/p thoracentesis.  4. CAD - PTCA to RCA    Subjective:    Denies chest pain, has been placed on BiPAP due to severe hypercarbia. She had been refusing in the hospital.   Objective:   Temp:  [97 F (36.1 C)-97.6 F (36.4 C)] 97.1 F (36.2 C) (05/30 0759) Pulse Rate:  [53-122] 99 (05/30 0925) Resp:  [13-22] 16 (05/30 0925) BP: (84-129)/(53-76) 102/63 mmHg (05/30 0900) SpO2:  [90 %-100 %] 94 % (05/30 0925) FiO2 (%):  [40 %] 40 % (05/29 2323) Weight:  [161 lb 9.6 oz (73.3 kg)-162 lb 14.7 oz (73.9 kg)] 161 lb 9.6 oz (73.3 kg) (05/30 0500) Last BM Date: 03/17/16  Filed Weights   03/12/16 0500 03/17/16 1800 03/18/16 0500  Weight: 172 lb 13.5 oz (78.4 kg) 162 lb 14.7 oz (73.9 kg) 161 lb 9.6 oz (73.3 kg)    Intake/Output Summary (Last 24 hours) at 03/18/16 0945 Last data filed at 03/18/16 0847  Gross per 24 hour  Intake    645 ml  Output   1325 ml  Net   -680 ml    Telemetry: Atrial fib, rates up to 130's but have become more controlled over the last 24 hours.   Exam:  General: No acute distress.  HEENT: Conjunctiva and lids normal, oropharynx clear.  Lungs: Clear to auscultation, nonlabored. Wearing BiPAP  Cardiac: No elevated JVP or bruits. IRRR, 1/6 systolic murmur, no gallop or rub.   Abdomen: Normoactive bowel sounds, nontender, nondistended.  Extremities: No pitting edema, distal pulses diminished.   Neuropsychiatric: Alert and oriented x3, affect appropriate.   Lab Results:  Basic Metabolic Panel:  Recent Labs Lab 03/14/16 0518  03/16/16 [id number] 03/17/16 0557 03/18/16 0441  NA 138  < > 138 138 141  K 3.4*  < > 3.4* 3.9 4.3  CL 81*  < > 79* 81* 80*  CO2 49*  < > 50* 49* 50*  GLUCOSE 230*  < > 263* 269* 192*  BUN 36*  < > 48* 45* 56*    CREATININE 0.75  < > 0.84 0.87 1.13*  CALCIUM 8.3*  < > 8.3* 8.1* 8.1*  MG 1.9  --   --   --   --   < > = values in this interval not displayed.  CBC:  Recent Labs Lab 03/12/16 0412 03/15/16 0507 03/18/16 0441  WBC 5.0 4.1 7.1  HGB 12.2 10.9* 11.1*  HCT 42.7 35.6* 38.1  MCV 104.1* 98.3 102.4*  PLT 127* 119* 151   Echocardiogram 03/10/2016 Mild LVH with LVEF 40-45%, diffuse hypokinesis with akinesis of  the basal inferior wall. Indeterminate diastolic function in the  setting of atrial fibrillation. Septal dyssynergy suggesting IVCD  and diastolic septal flattening also noted. Moderate left atrial  enlargement. MAC with mild to moderate mitral regurgitation.  Sclerotic aortic valve with trivial aortic regurgitation.  Severely dilated right ventricle with severely reduced  contraction. Moderate right atrial enlargement. Mild tricuspid  regurgitation with PASP 30 mmHg. Left pleural effusion noted as  well as small pericardial effusion.  Radiology: Dg Chest Port 1 View  03/18/2016  CLINICAL DATA:  Difficulty breathing EXAM: PORTABLE CHEST 1 VIEW COMPARISON:  03/15/2016 FINDINGS: Cardiac shadow is stable. Bilateral pleural effusions are again seen and stable. Left basilar consolidation is again noted. Aortic calcifications are again seen. No  acute bony abnormality is noted. IMPRESSION: Bilateral pleural effusions left greater than right. Left basilar consolidation is noted. Electronically Signed   By: Alcide Clever M.D.   On: 03/18/2016 07:12     ECG: Atrial fibrillation with RBBB. Inferior Q-waves. Rate of 104   Medications:   Scheduled Medications: . acetaZOLAMIDE  250 mg Oral TID  . antiseptic oral rinse  7 mL Mouth Rinse BID  . antiseptic oral rinse  7 mL Mouth Rinse q12n4p  . apixaban  5 mg Oral BID  . carvedilol  12.5 mg Oral BID WC  . chlorhexidine  15 mL Mouth Rinse BID  . digoxin  0.125 mg Oral Daily  . insulin aspart  0-20 Units Subcutaneous TID WC  .  insulin glargine  7 Units Subcutaneous Daily  . LORazepam  0.5 mg Oral Daily  . methylPREDNISolone (SOLU-MEDROL) injection  60 mg Intravenous Q8H  . pantoprazole  40 mg Oral Daily  . piperacillin-tazobactam (ZOSYN)  IV  3.375 g Intravenous Q8H  . sodium chloride  500 mL Intravenous Once  . sodium chloride flush  3 mL Intravenous Q12H  . vancomycin  1,250 mg Intravenous Q24H        PRN Medications:  acetaminophen **OR** [DISCONTINUED] acetaminophen, haloperidol lactate, ipratropium-albuterol, labetalol, nitroGLYCERIN, [DISCONTINUED] ondansetron **OR** ondansetron (ZOFRAN) IV, polyethylene glycol   Assessment and Plan:   1.Atrial fibrillation: Currently better controlled  since BiPAP has been started. EF of 45%. On carvedilol and digoxin. Diltiazem has been discontinued. Now on Eliquis. Occasional HR elevations over the weekend but now in th  High 90's. Could consider titration of digoxin but creatinine is 1.13 with GRF of 44. She is on steroids which may be contributing to HR.   2. Cardiomyopathy: EF of 45%. No evidence of decompensated CHF now. .Need better HR control.  3. Bilateral pleural effusions: S/P thoracentesis removing 1.liter of pleural  fluid. Breathing status is improved.   4. CAD: No complaints of chest pain.   5. Hypercarbia; Remains on BiPAP. ABG's will follow.   [name]. Lawrence NP [facility]  03/18/2016, 9:45 AM   Patient seen and discussed with NP Lyman Bishop, I agree with her documentation. Her afib is rate controlled with coreg, anticoag with eliquis for CHADS2Vasc score of 5. Regarding her acute on chronic systolic HF she is negative 9.5 liters since admission, diuretics on hold in setting of rising Cr. Continue to hold diuretics, she appears euvolemic by exam. Would expect a certain degree of tachycardia given her respiratory status, rates roughly 110 or below would be reasoble, we will continue to monitor   Dominga Ferry MD

## 2016-03-19 DIAGNOSIS — J438 Other emphysema: Secondary | ICD-10-CM

## 2016-03-19 DIAGNOSIS — E119 Type 2 diabetes mellitus without complications: Secondary | ICD-10-CM

## 2016-03-19 LAB — BASIC METABOLIC PANEL
Anion gap: 8 (ref 5–15)
BUN: 54 mg/dL — AB (ref 4–21)
BUN: 54 mg/dL — ABNORMAL HIGH (ref 6–20)
BUN: 56 mg/dL — AB (ref 4–21)
CALCIUM: 8.4 mg/dL — AB (ref 8.9–10.3)
CO2: 48 mmol/L — AB (ref 22–32)
CREATININE: 1 mg/dL (ref 0.5–1.1)
CREATININE: 0.97 mg/dL (ref 0.44–1.00)
Chloride: 82 mmol/L — ABNORMAL LOW (ref 101–111)
Creatinine: 1.1 mg/dL (ref 0.5–1.1)
GFR calc non Af Amer: 53 mL/min — ABNORMAL LOW (ref >60)
GLUCOSE: 214 mg/dL
GLUCOSE: 214 mg/dL — AB (ref 65–99)
Glucose: 192 mg/dL
POTASSIUM: 4 mmol/L (ref 3.4–5.3)
POTASSIUM: 4 mmol/L (ref 3.5–5.1)
Potassium: 4.3 mmol/L (ref 3.4–5.3)
SODIUM: 141 mmol/L (ref 137–147)
SODIUM: 138 mmol/L (ref 135–145)
Sodium: 138 mmol/L (ref 137–147)

## 2016-03-19 LAB — BLOOD GAS, ARTERIAL
Acid-Base Excess: 22 mmol/L — ABNORMAL HIGH (ref 0.0–2.0)
Bicarbonate: 44.4 mEq/L — ABNORMAL HIGH (ref 20.0–24.0)
DELIVERY SYSTEMS: POSITIVE
DRAWN BY: 419771
EXPIRATORY PAP: 5
FIO2: 0.35
Inspiratory PAP: 20
LHR: 14 {breaths}/min
O2 Saturation: 96 %
PEEP: 5 cmH2O
PO2 ART: 83.6 mmHg (ref 80.0–100.0)
TCO2: 13.4 mmol/L (ref 0–100)
pCO2 arterial: 88.5 mmHg (ref 35.0–45.0)
pH, Arterial: 7.361 (ref 7.350–7.450)

## 2016-03-19 LAB — HEMOGLOBIN A1C
HEMOGLOBIN A1C: 6.4 % — AB (ref 4.8–5.6)
MEAN PLASMA GLUCOSE: 137 mg/dL

## 2016-03-19 LAB — GLUCOSE, CAPILLARY
Glucose-Capillary: 195 mg/dL — ABNORMAL HIGH (ref 65–99)
Glucose-Capillary: 271 mg/dL — ABNORMAL HIGH (ref 65–99)
Glucose-Capillary: 201 mg/dL — ABNORMAL HIGH (ref 65–99)

## 2016-03-19 LAB — CBC
HCT: 34 % — ABNORMAL LOW (ref 36.0–46.0)
HEMOGLOBIN: 9.9 g/dL — AB (ref 12.0–15.0)
MCH: 29.8 pg (ref 26.0–34.0)
MCHC: 29.1 g/dL — ABNORMAL LOW (ref 30.0–36.0)
MCV: 102.4 fL — ABNORMAL HIGH (ref 78.0–100.0)
Platelets: 165 10*3/uL (ref 150–400)
RBC: 3.32 MIL/uL — ABNORMAL LOW (ref 3.87–5.11)
RDW: 14 % (ref 11.5–15.5)
WBC: 5.7 10*3/uL (ref 4.0–10.5)

## 2016-03-19 MED ORDER — IPRATROPIUM-ALBUTEROL 0.5-2.5 (3) MG/3ML IN SOLN
3.0000 mL | Freq: Three times a day (TID) | RESPIRATORY_TRACT | Status: DC
  Administered 2016-03-19 – 2016-03-20 (×3): 3 mL via RESPIRATORY_TRACT
  Filled 2016-03-19 (×3): qty 3

## 2016-03-19 NOTE — Clinical Social Work Note (Signed)
CSW updated PNC on pt. Bed still available at this time. CSW also discussed d/c plan with pt who [facility].   Derenda FennelKara Sadao Weyer, LCSW [phone]

## 2016-03-19 NOTE — Progress Notes (Signed)
PROGRESS NOTE    [name]  ZOX:[id number] DOB: 1930-12-02 DOA: 03/09/2016 PCP: [name], MD   Brief Narrative:  62 yof with history of afib and CHADS score of 5 on ASA and no anticoagulation due to history of bleeding, CAD with stenting of the RCA and history of inferior wall MI in 1999, HTN, DM Type 2, HLD. She presented with complaints of weakness and fatigue, which was found to be acute CHF with bilateral pleural effusion. Cardiology and pulmonology consulted. She underwent 1 L thoracentesis and left lung fluid removal on 03/10/2016 which appears to be transudate due to CHF. However her echogram shows a depressed EF with wall motion of normality.   Assessment & Plan:   Principal Problem:   Acute on chronic respiratory failure with hypoxia (HCC) Active Problems:   Type 2 diabetes mellitus (HCC)   COPD (chronic obstructive pulmonary disease) (HCC)   Pleural effusion on left   Atrial fibrillation (HCC)   Status post thoracentesis   Pleural effusion, bilateral   Atrial fibrillation with RVR (HCC)   Confusion   SOB (shortness of breath)   Mental confusion   Palliative care encounter   Goals of care, counseling/discussion   1. Acute on chronic respiratory failure with hypoxia due to bilateral pleural effusions. Secondary to acute on chronic combined systolic and diastolic CHF. S/p left-sided thoracentesis with 1 L transudative fluid removal on 03/10/2016, she developed hypercarbic failure, requiring transfer to SDU and bipap support on 5/23. Pulmonology following. Although she can discontinue continuous BiPAP, he recommends BiPAP at night. Currently breathing comfortably on [name]. Due to decompensation, PMT consulted for goals of care discussion.   2. Possible aspiration pneumonia. [name] has been treated with IV abx and remains afebrile. Since she has been adequately will discontinue Vancomycin and Zosyn.  3. Atrial fibrillation, [place] score 5. Started on Eliquis. Cardizem  discontinued. Currently on Coreg and digoxin.   4. Acute on chronic combined systolic and diastolic CHF with EF 45% with wall motion of normality. Diuresis currently on hold. Cardiology following. Restart oral diuretics tomorrow.  5. COPD exacerbation. Currently on IV steroids and nebs. She has competed a course of abx. Will wean steroids as tolerated.  6. DM Type 2, diet controlled. Continue SSI.   DVT prophylaxis: Heparin  Code Status: Full Family Communication: Daughter bedside  Disposition Plan: Anticipate discharge in 2-3 days.    Consultants:   Pulmonary   Cardiology   Palliative   PT-SNF  Procedures:   Left-sided thoracentesis with 1L transudative  fluid removed 5/22  ECHO  Study Conclusions  - Left ventricle: The cavity size was normal. Wall thickness was  increased in a pattern of mild LVH. Systolic function was mildly  to moderately reduced. The estimated ejection fraction was in the  range of 40% to 45%. Diffuse hypokinesis. There is akinesis of  the basalinferior myocardium. The study is not technically  sufficient to allow evaluation of LV diastolic function. - Ventricular septum: Septal motion showed abnormal function and  dyssynergy. The contour showed diastolic flattening. - Aortic valve: Trileaflet; mildly calcified leaflets. Left  coronary cusp mobility was restricted. There was trivial  regurgitation. - Mitral valve: Calcified annulus. There was mild to moderate  regurgitation. - Left atrium: The atrium was moderately dilated. - Right ventricle: The cavity size was severely dilated. Systolic  function was severely reduced. - Right atrium: The atrium was moderately dilated. Central venous  pressure (est): 8 mm Hg. - Tricuspid valve: There was mild regurgitation. -  Pulmonary arteries: PA peak pressure: 30 mm Hg (S). - Pericardium, extracardiac: A small pericardial effusion was  identified circumferential to the heart. There was a left  pleural  effusion.  Antimicrobials:   Vancomycin 5/24>>5/31  Zosyn 5/29>>5/31   Subjective: Breathing is improving, Still has mild cough. Wore BiPAP overnight without difficulty. Denies choking when eating or drinking.   Objective: Filed Vitals:   03/19/16 0400 03/19/16 0500 03/19/16 0600 03/19/16 0700  BP: 107/71 99/65 101/62 115/65  Pulse: 70 59 91 76  Temp: 97 F (36.1 C)     TempSrc: Axillary     Resp: Height:      Weight:  74.5 kg (164 lb 3.9 oz)    SpO2: 98% 96% 95% 98%    Intake/Output Summary (Last 24 hours) at 03/19/16 0746 Last data filed at 03/19/16 0600  Gross per 24 hour  Intake    880 ml  Output   1450 ml  Net   -570 ml   Filed Weights   03/17/16 1800 03/18/16 0500 03/19/16 0500  Weight: 73.9 kg (162 lb 14.7 oz) 73.3 kg (161 lb 9.6 oz) 74.5 kg (164 lb 3.9 oz)    Examination: General exam: NAD.  Respiratory system: Mild wheeze bilaterally . Respiratory effort normal. Cardiovascular system: Irregular. No JVD, murmurs, rubs, gallops or clicks. Trace  edema. Gastrointestinal system: Abdomen is nondistended, soft and nontender. No organomegaly or masses felt. Normal bowel sounds heard. Psychiatry: Judgement and insight appear normal. Mood & affect appropriate.   Data Reviewed:   CBC:  Recent Labs Lab 03/15/16 0507 03/18/16 0441 03/19/16 0519  WBC 4.1 7.1 5.7  HGB 10.9* 11.1* 9.9*  HCT 35.6* 38.1 34.0*  MCV 98.3 102.4* 102.4*  PLT 119* 151 165   Basic Metabolic Panel:  Recent Labs Lab 03/14/16 0518 03/15/16 0507 03/16/16 0633 03/17/16 0557 03/18/16 0441 03/19/16 0519  NA 138 139 138 138 141 138  K 3.4* 3.7 3.4* 3.9 4.3 4.0  CL 81* 81* 79* 81* 80* 82*  CO2 49* 50* 50* 49* 50* 48*  GLUCOSE 230* 251* 263* 269* 192* 214*  BUN 36* 45* 48* 45* 56* 54*  CREATININE 0.75 0.79 0.84 0.87 1.13* 0.97  CALCIUM 8.3* 8.4* 8.3* 8.1* 8.1* 8.4*  MG 1.9  --   --   --   --   --    HbA1C:  Recent Labs  03/17/16 0557  HGBA1C 6.4*    CBG:  Recent Labs Lab 03/17/16 2122 03/18/16 0757 03/18/16 1220 03/18/16 1605 03/18/16 2145  GLUCAP 220* 195* 144* 117* 230*   Sepsis Labs: (procalcitonin:4,lacticidven:4)  ) Recent Results (from the past 240 hour(s))  Culture, body fluid-bottle     Status: None   Collection Time: 03/10/16  1:00 PM  Result Value Ref Range Status   Specimen Description FLUID LEFT PLEURAL COLLECTED BY DOCTOR BOLES  Final   Special Requests BOTTLES DRAWN AEROBIC AND ANAEROBIC 10CC EACH  Final   Culture NO GROWTH 5 DAYS  Final   Report Status 03/15/2016 FINAL  Final  Gram stain     Status: None   Collection Time: 03/10/16  1:00 PM  Result Value Ref Range Status   Specimen Description FLUID LEFT PLEURAL COLLECTED BY DOCTOR BOLES  Final   Special Requests NONE  Final   Gram Stain   Final    CYTOSPIN SMEAR WBC PRESENT, PREDOMINANTLY MONONUCLEAR NO ORGANISMS SEEN Performed at [facility]    Report Status 03/10/2016 FINAL  Final  MRSA PCR Screening     Status: None   Collection Time: 03/11/16 11:42 PM  Result Value Ref Range Status   MRSA by PCR NEGATIVE NEGATIVE Final    Comment:        The GeneXpert MRSA Assay (FDA approved for NASAL specimens only), is one component of a comprehensive MRSA colonization surveillance program. It is not intended to diagnose MRSA infection nor to guide or monitor treatment for MRSA infections.          Radiology Studies: Dg Chest Port 1 View  03/18/2016  CLINICAL DATA:  Difficulty breathing EXAM: PORTABLE CHEST 1 VIEW COMPARISON:  03/15/2016 FINDINGS: Cardiac shadow is stable. Bilateral pleural effusions are again seen and stable. Left basilar consolidation is again noted. Aortic calcifications are again seen. No acute bony abnormality is noted. IMPRESSION: Bilateral pleural effusions left greater than right. Left basilar consolidation is noted. Electronically Signed   By: Alcide CleverMark  Lukens M.D.   On: 03/18/2016 07:12    Scheduled  Meds: . antiseptic oral rinse  7 mL Mouth Rinse BID  . antiseptic oral rinse  7 mL Mouth Rinse q12n4p  . apixaban  5 mg Oral BID  . carvedilol  12.5 mg Oral BID WC  . chlorhexidine  15 mL Mouth Rinse BID  . digoxin  0.125 mg Oral Daily  . insulin aspart  0-20 Units Subcutaneous TID WC  . insulin glargine  7 Units Subcutaneous Daily  . LORazepam  0.5 mg Oral Daily  . methylPREDNISolone (SOLU-MEDROL) injection  60 mg Intravenous Q8H  . pantoprazole  40 mg Oral Daily  . piperacillin-tazobactam (ZOSYN)  IV  3.375 g Intravenous Q8H  . sodium chloride flush  3 mL Intravenous Q12H  . vancomycin  1,250 mg Intravenous Q24H   Continuous Infusions:    LOS: 10 days    Time spent: 20 minutes   Erick BlinksMemon, Armonee Bojanowski, MD [facility] Pager [phone]  If 7PM-7AM, please contact night-coverage [email or web address] Password [email or web address] 03/19/2016, 7:46 AM    By signing my name below, I, Zadie CleverlyJessica Augustus, attest that this documentation has been prepared under the direction and in the presence of Erick BlinksJehanzeb Lydiana Milley, MD. Electronically signed: Zadie CleverlyJessica Augustus, Scribe. 03/19/2016 9:30am   I, Dr. Erick BlinksJehanzeb Alroy Portela, personally performed the services described in this documentaiton. All medical record entries made by the scribe were at my direction and in my presence. I have reviewed the chart and agree that the record reflects my personal performance and is accurate and complete  Erick BlinksJehanzeb Levorn Oleski, MD, 03/19/2016 9:54 AM

## 2016-03-19 NOTE — Progress Notes (Signed)
Physical Therapy Treatment Patient Details Name: [name] MRN: [id number] DOB: 12/15/1930 Today's Date: 03/19/2016    History of Present Illness 82 yo F admitted with worsening SOB over the last 3-4 weeks.  DX: CHF with B pleural effusions.  S/P thoracentesis 5/22 with removal of 1L.  Echo with depressed EF of 45%.  3/22 - pt had some altered mental status with ABG was 7.22/109/ 63, and CO2>40 - requiring BiPAP, and therefore transferred to ICU.  PMH: A-fib, not on anticoagulation due to GI bleed, CAD with RCA stenting, MI, HTN, DM2, asthma, COPD, cholecystectomy, exploratory laparotomy, partial colectomy.     PT Comments    Pt received in bed, and expressed that she was "willing to try anything" today.  Pt required Min A for supine<>sit, and Min A for sit<>stand, however she required Mod A for stand pivot transfer due to need for increased directional cues, and assistance to negotiate RW.  Continue to recommend SNF to build her strength, endurance, and balance.   Follow Up Recommendations        Equipment Recommendations  None recommended by PT    Recommendations for Other Services       Precautions / Restrictions Precautions Precautions: Fall Restrictions Weight Bearing Restrictions: No    Mobility  Bed Mobility Overal bed mobility: Needs Assistance Bed Mobility: Supine to Sit     Supine to sit: Min assist;HOB elevated        Transfers Overall transfer level: Needs assistance Equipment used: Rolling walker (2 wheeled) Transfers: Sit to/from Stand Sit to Stand: Min assist Stand pivot transfers: Mod assist       General transfer comment: Pt required cues for safe hand placement for sit<>stand transfers, and then as pt was turning to sit in the chair, she required increased cues for which direction to go and assistance to turn the RW to get lined up with the chair.   Ambulation/Gait Ambulation/Gait assistance:  (Further gait deferred due to pt being afraid  she was going to become nauseated. )               Stairs            Wheelchair Mobility    Modified Rankin (Stroke Patients Only)       Balance Overall balance assessment: Needs assistance Sitting-balance support: Bilateral upper extremity supported Sitting balance-Leahy Scale: Fair     Standing balance support: Bilateral upper extremity supported Standing balance-Leahy Scale: Fair                      Cognition Arousal/Alertness: Awake/alert Behavior During Therapy: WFL for tasks assessed/performed Overall Cognitive Status:  (Pt requires increased vc's and tc's for functional mobility today. )                      Exercises      General Comments        Pertinent Vitals/Pain Pain Assessment: No/denies pain    Home Living                      Prior Function            PT Goals (current goals can now be found in the care plan section) Acute Rehab PT Goals Patient Stated Goal: Pt wants to get her strength back PT Goal Formulation: With patient/family Time For Goal Achievement: 03/26/16 Potential to Achieve Goals: Fair Progress towards PT goals: Progressing toward goals  Frequency  Min 5X/week    PT Plan Current plan remains appropriate    Co-evaluation             End of Session Equipment Utilized During Treatment: Gait belt;Oxygen Activity Tolerance: Patient limited by fatigue Patient left: in bed;with call bell/phone within reach;with family/visitor present;with SCD's reapplied     Time: 1610-9604 PT Time Calculation (min) (ACUTE ONLY): 21 min  Charges:  $Therapeutic Activity: 8-22 mins                    G Codes:      Beth Payzlee Ryder, PT, DPT X: [id number]   03/19/2016, 2:14 PM

## 2016-03-19 NOTE — Progress Notes (Signed)
Subjective: She says she feels better this morning. She has been on BiPAP. She is more alert than when I saw her yesterday.  Objective: Vital signs in last 24 hours: Temp:  [97 F (36.1 C)-98.5 F (36.9 C)] 97 F (36.1 C) (05/31 0400) Pulse Rate:  [59-106] 91 (05/31 0600) Resp:  [10-27] 14 (05/31 0600) BP: (84-123)/(49-75) 101/62 mmHg (05/31 0600) SpO2:  [91 %-98 %] 95 % (05/31 0600) Weight:  [74.5 kg (164 lb 3.9 oz)] 74.5 kg (164 lb 3.9 oz) (05/31 0500) Weight change: 0.6 kg (1 lb 5.2 oz) Last BM Date: 03/18/16  Intake/Output from previous day: 05/30 0701 - 05/31 0700 In: 880 [P.O.:480; IV Piggyback:400] Out: 1450 [Urine:1450]  PHYSICAL EXAM General appearance: alert, cooperative, mild distress and On BiPAP Resp: rhonchi bilaterally Cardio: irregularly irregular rhythm GI: soft, non-tender; bowel sounds normal; no masses,  no organomegaly Extremities: extremities normal, atraumatic, no cyanosis or edema  Lab Results:  Results for orders placed or performed during the hospital encounter of 03/09/16 (from the past 48 hour(s))  Glucose, capillary     Status: Abnormal   Collection Time: 03/17/16  8:14 AM  Result Value Ref Range   Glucose-Capillary 221 (H) 65 - 99 mg/dL  Glucose, capillary     Status: Abnormal   Collection Time: 03/17/16 11:50 AM  Result Value Ref Range   Glucose-Capillary 227 (H) 65 - 99 mg/dL  Digoxin level     Status: None   Collection Time: 03/17/16 12:32 PM  Result Value Ref Range   Digoxin Level 0.9 0.8 - 2.0 ng/mL  Glucose, capillary     Status: Abnormal   Collection Time: 03/17/16  4:41 PM  Result Value Ref Range   Glucose-Capillary 130 (H) 65 - 99 mg/dL  Blood gas, arterial     Status: Abnormal   Collection Time: 03/17/16  5:52 PM  Result Value Ref Range   O2 Content 2.0 L/min   Delivery systems NASAL CANNULA    pH, Arterial 7.223 (L) 7.350 - 7.450   pCO2 arterial  35.0 - 45.0 mmHg    CRITICAL RESULT CALLED TO, READ BACK BY AND VERIFIED  WITH:    Comment: STEPHEN CHIU,MD ON 03/17/2016 BY PEVIANY LAWSON,RRT AT 1610. ABOVE REPORTABLE RANGE CRITICAL RESULT CALLED TO, READ BACK BY AND VERIFIED WITH: STEPHEN CHIU,MD ON 03/17/2016 BY PEVIANY LAWSON,RRT [id number].    pO2, Arterial 65.1 (L) 80.0 - 100.0 mmHg   O2 Saturation 88.8 %   Collection site RIGHT RADIAL    Drawn by [phone]    Sample type ARTERIAL    Allens test (pass/fail) PASS PASS  Glucose, capillary     Status: Abnormal   Collection Time: 03/17/16  9:22 PM  Result Value Ref Range   Glucose-Capillary 220 (H) 65 - 99 mg/dL  Basic metabolic panel     Status: Abnormal   Collection Time: 03/18/16  4:41 AM  Result Value Ref Range   Sodium 141 135 - 145 mmol/L   Potassium 4.3 3.5 - 5.1 mmol/L   Chloride 80 (L) 101 - 111 mmol/L   CO2 50 (H) 22 - 32 mmol/L   Glucose, Bld 192 (H) 65 - 99 mg/dL   BUN 56 (H) 6 - 20 mg/dL   Creatinine, Ser 1.13 (H) 0.44 - 1.00 mg/dL   Calcium 8.1 (L) 8.9 - 10.3 mg/dL   GFR calc non Af Amer 44 (L) >60 mL/min   GFR calc Af Amer 51 (L) >60 mL/min    Comment: (NOTE) The eGFR  has been calculated using the CKD EPI equation. This calculation has not been validated in all clinical situations. eGFR's persistently <60 mL/min signify possible Chronic Kidney Disease.    Anion gap 11 5 - 15  CBC     Status: Abnormal   Collection Time: 03/18/16  4:41 AM  Result Value Ref Range   WBC 7.1 4.0 - 10.5 K/uL   RBC 3.72 (L) 3.87 - 5.11 MIL/uL   Hemoglobin 11.1 (L) 12.0 - 15.0 g/dL   HCT 09.7 06.4 - 68.9 %   MCV 102.4 (H) 78.0 - 100.0 fL   MCH 29.8 26.0 - 34.0 pg   MCHC 29.1 (L) 30.0 - 36.0 g/dL   RDW 40.2 65.7 - 67.9 %   Platelets 151 150 - 400 K/uL  Blood gas, arterial     Status: Abnormal (Preliminary result)   Collection Time: 03/18/16  4:45 AM  Result Value Ref Range   FIO2 100.00    Delivery systems BILEVEL POSITIVE AIRWAY PRESSURE    LHR 14 resp/min   Inspiratory PAP 20    Expiratory PAP 5    pH, Arterial 7.358 7.350 - 7.450   pCO2 arterial  94.5 (HH) 35.0 - 45.0 mmHg    Comment: CRITICAL RESULT CALLED TO, READ BACK BY AND VERIFIED WITH:  CINDY PHILLIPS,RN BY K KNICK RRT,RCP ON 03/18/2016 AT 0452    pO2, Arterial 141.0 (H) 80.0 - 100.0 mmHg   Bicarbonate 47.0 (H) 20.0 - 24.0 mEq/L   TCO2 15.2 0 - 100 mmol/L   Acid-Base Excess 24.8 (H) 0.0 - 2.0 mmol/L   Acid-base deficit PENDING 0.0 - 2.0 mmol/L   O2 Saturation 99.0 %   Collection site RIGHT RADIAL    Drawn by [id number]    Sample type ARTERIAL    Allens test (pass/fail) PASS PASS  Glucose, capillary     Status: Abnormal   Collection Time: 03/18/16  7:57 AM  Result Value Ref Range   Glucose-Capillary 195 (H) 65 - 99 mg/dL   Comment 1 Notify RN   Glucose, capillary     Status: Abnormal   Collection Time: 03/18/16 12:20 PM  Result Value Ref Range   Glucose-Capillary 144 (H) 65 - 99 mg/dL  Blood gas, arterial     Status: Abnormal   Collection Time: 03/18/16  2:47 PM  Result Value Ref Range   FIO2 0.45    O2 Content 45.0 L/min   Delivery systems BILEVEL POSITIVE AIRWAY PRESSURE    Inspiratory PAP 20.0    Expiratory PAP 5.0    pH, Arterial 7.383 7.350 - 7.450   pCO2 arterial 88.3 (HH) 35.0 - 45.0 mmHg    Comment: CRITICAL RESULT CALLED TO, READ BACK BY AND VERIFIED WITH: LAWRENCE SHELTON,RN ON 03/18/2016 BY PEVIANY LAWSON,RRT [id number].    pO2, Arterial 69.3 (L) 80.0 - 100.0 mmHg   Bicarbonate 47.2 (H) 20.0 - 24.0 mEq/L   Acid-Base Excess 24.6 (H) 0.0 - 2.0 mmol/L   O2 Saturation 93.1 %   Collection site RIGHT RADIAL    Drawn by [phone]    Sample type ARTERIAL    Allens test (pass/fail) PASS PASS  Glucose, capillary     Status: Abnormal   Collection Time: 03/18/16  4:05 PM  Result Value Ref Range   Glucose-Capillary 117 (H) 65 - 99 mg/dL   Comment 1 Notify RN   Glucose, capillary     Status: Abnormal   Collection Time: 03/18/16  9:45 PM  Result Value Ref Range   Glucose-Capillary  230 (H) 65 - 99 mg/dL  Blood gas, arterial     Status: Abnormal   Collection Time:  03/19/16  3:52 AM  Result Value Ref Range   FIO2 0.35    Delivery systems BILEVEL POSITIVE AIRWAY PRESSURE    LHR 14 resp/min   Peep/cpap 5.0 cm H20   Inspiratory PAP 20    Expiratory PAP 5    pH, Arterial 7.361 7.350 - 7.450   pCO2 arterial 88.5 (HH) 35.0 - 45.0 mmHg    Comment: CRITICAL RESULT CALLED TO, READ BACK BY AND VERIFIED WITH: CINDY PHILLIPS RN AT 0410 BY JAMIE SILVA RRT ON 5 CRITICAL RESULT CALLED TO, READ BACK BY AND VERIFIED WITH: CINDY PHILLIPS RN AT 0410 BY JAMIE SILVA RRT ON 03/19/16    pO2, Arterial 83.6 80.0 - 100.0 mmHg   Bicarbonate 44.4 (H) 20.0 - 24.0 mEq/L   TCO2 13.4 0 - 100 mmol/L   Acid-Base Excess 22.0 (H) 0.0 - 2.0 mmol/L   O2 Saturation 96.0 %   Collection site RIGHT RADIAL    Drawn by [phone]    Sample type ARTERIAL DRAW    Allens test (pass/fail) PASS PASS  Basic metabolic panel     Status: Abnormal   Collection Time: 03/19/16  5:19 AM  Result Value Ref Range   Sodium 138 135 - 145 mmol/L   Potassium 4.0 3.5 - 5.1 mmol/L   Chloride 82 (L) 101 - 111 mmol/L   CO2 48 (H) 22 - 32 mmol/L   Glucose, Bld 214 (H) 65 - 99 mg/dL   BUN 54 (H) 6 - 20 mg/dL   Creatinine, Ser 0.97 0.44 - 1.00 mg/dL   Calcium 8.4 (L) 8.9 - 10.3 mg/dL   GFR calc non Af Amer 53 (L) >60 mL/min   GFR calc Af Amer >60 >60 mL/min    Comment: (NOTE) The eGFR has been calculated using the CKD EPI equation. This calculation has not been validated in all clinical situations. eGFR's persistently <60 mL/min signify possible Chronic Kidney Disease.    Anion gap 8 5 - 15  CBC     Status: Abnormal   Collection Time: 03/19/16  5:19 AM  Result Value Ref Range   WBC 5.7 4.0 - 10.5 K/uL   RBC 3.32 (L) 3.87 - 5.11 MIL/uL   Hemoglobin 9.9 (L) 12.0 - 15.0 g/dL   HCT 34.0 (L) 36.0 - 46.0 %   MCV 102.4 (H) 78.0 - 100.0 fL   MCH 29.8 26.0 - 34.0 pg   MCHC 29.1 (L) 30.0 - 36.0 g/dL   RDW 14.0 11.5 - 15.5 %   Platelets 165 150 - 400 K/uL    ABGS  Recent Labs  03/19/16 0352  PHART  7.361  PO2ART 83.6  TCO2 13.4  HCO3 44.4*   CULTURES Recent Results (from the past 240 hour(s))  Culture, body fluid-bottle     Status: None   Collection Time: 03/10/16  1:00 PM  Result Value Ref Range Status   Specimen Description FLUID LEFT PLEURAL COLLECTED BY DOCTOR BOLES  Final   Special Requests BOTTLES DRAWN AEROBIC AND ANAEROBIC 10CC EACH  Final   Culture NO GROWTH 5 DAYS  Final   Report Status 03/15/2016 FINAL  Final  Gram stain     Status: None   Collection Time: 03/10/16  1:00 PM  Result Value Ref Range Status   Specimen Description FLUID LEFT PLEURAL COLLECTED BY DOCTOR BOLES  Final   Special Requests NONE  Final  Gram Stain   Final    CYTOSPIN SMEAR WBC PRESENT, PREDOMINANTLY MONONUCLEAR NO ORGANISMS SEEN Performed at [facility]    Report Status 03/10/2016 FINAL  Final  MRSA PCR Screening     Status: None   Collection Time: 03/11/16 11:42 PM  Result Value Ref Range Status   MRSA by PCR NEGATIVE NEGATIVE Final    Comment:        The GeneXpert MRSA Assay (FDA approved for NASAL specimens only), is one component of a comprehensive MRSA colonization surveillance program. It is not intended to diagnose MRSA infection nor to guide or monitor treatment for MRSA infections.    Studies/Results: Dg Chest Port 1 View  03/18/2016  CLINICAL DATA:  Difficulty breathing EXAM: PORTABLE CHEST 1 VIEW COMPARISON:  03/15/2016 FINDINGS: Cardiac shadow is stable. Bilateral pleural effusions are again seen and stable. Left basilar consolidation is again noted. Aortic calcifications are again seen. No acute bony abnormality is noted. IMPRESSION: Bilateral pleural effusions left greater than right. Left basilar consolidation is noted. Electronically Signed   By: Inez Catalina M.D.   On: 03/18/2016 07:12    Medications:  Prior to Admission:  Prescriptions prior to admission  Medication Sig Dispense Refill Last Dose  . acetaminophen (TYLENOL) 500 MG tablet Take 1,000 mg by  mouth every 6 (six) hours as needed for mild pain.   03/08/2016  . aspirin EC 81 MG tablet Take 81 mg by mouth daily.   03/09/2016  . diltiazem (CARDIZEM CD) 180 MG 24 hr capsule Take 1 capsule (180 mg total) by mouth daily. 90 capsule 3 03/09/2016  . furosemide (LASIX) 40 MG tablet Take 40 mg by mouth daily.     03/09/2016  . ipratropium-albuterol (DUONEB) 0.5-2.5 (3) MG/3ML SOLN Take 3 mLs by nebulization as needed.     Past Month  . LORazepam (ATIVAN) 1 MG tablet Take 0.5 mg by mouth daily.    03/09/2016  . omeprazole (PRILOSEC) 20 MG capsule Take 20 mg by mouth at bedtime.    03/08/2016  . atorvastatin (LIPITOR) 10 MG tablet Take 10 mg by mouth daily. Reported on 12/04/2015   Taking  . nitroGLYCERIN (NITROSTAT) 0.4 MG SL tablet Place 1 tablet (0.4 mg total) under the tongue every 5 (five) minutes as needed for chest pain. 25 tablet 3 Never  . NON FORMULARY Pt on 2.5 liters of O2 daily   Taking   Scheduled: . antiseptic oral rinse  7 mL Mouth Rinse BID  . antiseptic oral rinse  7 mL Mouth Rinse q12n4p  . apixaban  5 mg Oral BID  . carvedilol  12.5 mg Oral BID WC  . chlorhexidine  15 mL Mouth Rinse BID  . digoxin  0.125 mg Oral Daily  . insulin aspart  0-20 Units Subcutaneous TID WC  . insulin glargine  7 Units Subcutaneous Daily  . LORazepam  0.5 mg Oral Daily  . methylPREDNISolone (SOLU-MEDROL) injection  60 mg Intravenous Q8H  . pantoprazole  40 mg Oral Daily  . piperacillin-tazobactam (ZOSYN)  IV  3.375 g Intravenous Q8H  . sodium chloride flush  3 mL Intravenous Q12H  . vancomycin  1,250 mg Intravenous Q24H   Continuous:  KVQ:[id number] **OR** [DISCONTINUED] acetaminophen, haloperidol lactate, ipratropium-albuterol, labetalol, nitroGLYCERIN, [DISCONTINUED] ondansetron **OR** ondansetron (ZOFRAN) IV, polyethylene glycol  Assesment: She has acute on chronic hypoxic and hypercapnic respiratory failure. She's been requiring BiPAP. Although she looks better her PCO2 is still in the high  80s. PH is okay so she  is compensated but I think her baseline PCO2 is probably in the 50s or 60s. She initially had a very large pleural effusion and has had a thoracentesis. She has chronic atrial fibrillation and that's pretty stable now. She had what appears to be acute on chronic heart failure and she has diuresed significantly. She had palliative care evaluation yesterday and still wants to be considered full code and be intubated if needed. Principal Problem:   Acute on chronic respiratory failure with hypoxia (HCC) Active Problems:   Type 2 diabetes mellitus (HCC)   COPD (chronic obstructive pulmonary disease) (HCC)   Pleural effusion on left   Atrial fibrillation (HCC)   Status post thoracentesis   Pleural effusion, bilateral   Atrial fibrillation with RVR (HCC)   Confusion   SOB (shortness of breath)   Mental confusion   Palliative care encounter   Goals of care, counseling/discussion    Plan: Continue current treatments. See if she can come off BiPAP today. She will needed at night. She is still high risk of requiring intubation and mechanical ventilation considering her very elevated PCO2.    LOS: 10 days   Natassja Ollis L 03/19/2016, 7:17 AM

## 2016-03-19 NOTE — Progress Notes (Signed)
Inpatient Diabetes Program Recommendations  AACE/ADA: New Consensus Statement on Inpatient Glycemic Control (2015)  Target Ranges:  Prepandial:   less than 140 mg/dL      Peak postprandial:   less than 180 mg/dL (1-2 hours)      Critically ill patients:  140 - 180 mg/dL  Results for [name], [name] (MRN [id number]) as of 03/19/2016 09:05  Ref. Range 03/18/2016 07:57 03/18/2016 12:20 03/18/2016 16:05 03/18/2016 21:45 03/19/2016 07:23  Glucose-Capillary Latest Ref Range: 65-99 mg/dL 952195 (H) 841144 (H) 324117 (H) 230 (H) 195 (H)   Review of Glycemic Control  Diabetes history: DM2 Outpatient Diabetes medications: None (diet controlled) Current orders for Inpatient glycemic control: Novolog 0-20 units TID with meals, Lantus 7 units daily   Inpatient Diabetes Program Recommendations: Correction (SSI): Please order bedtime Novolog correction scale. Insulin - Meal Coverage: If patient is eating at least 50% of meals and steroids are continued, please consider ordering Novolog 3 units TID with meals for meal coverage.  Thanks, Orlando PennerMarie Taya Ashbaugh, RN, MSN, CDE Diabetes Coordinator Inpatient Diabetes Program [phone] (Team Pager from 8am to 5pm) [phone] ([facility] office) [phone] [facility] office) [phone] [facility] office)

## 2016-03-19 NOTE — Care Management Important Message (Signed)
Important Message  Patient Details  Name: Kathy CuriaVirginia L Panepinto MRN: [id number] Date of Birth: 11/17/1930   Medicare Important Message Given:  Yes    Malcolm MetroChildress, Denece Shearer Demske, RN 03/19/2016, 11:12 AM

## 2016-03-19 NOTE — Progress Notes (Signed)
Daily Progress Note   Patient Name: [name]       Date: 03/19/2016 DOB: 11/21/1930  Age: 82 y.o. MRN#: [id number] Attending Physician: Erick Blinks, MD Primary Care Physician: [name], MD Admit Date: 03/09/2016  Reason for Consultation/Follow-up: Disposition, Establishing goals of care and Psychosocial/spiritual support  Subjective: [name] sitting up in bed, she makes eye contact and greets me as I enter. There is no family at bedside as we start our discussion.  She shares that she does feel better today, but she does not remember talking to me yesterday. She seems somewhat confused, but her CO2 is high.  Daughter [name] enters, and I share Dr. Juanetta Gosling note this morning, also reviewing the plan for discharge. [name] states that she did find the paperwork regarding her mother's wishes. She states that no one is named as Public affairs consultant of attorney. Yesterday [name] shared that she wanted Page to make decisions for her. [name] shares that she does not feel her sister, [name], who lives out-of-town, will be in conflict with any of Page's decisions for their mother.  [name] shares that she found a will, but no advanced directives. I encourage [name] that her mother would often look at her for answers, and that she trusted Page to act in her best interest.  [name] shares that at this point, she would want only one round of CPR for her mother, "she's lived a good life", and if this does not work, then let her go.  We do not discuss elective intubation at this point today. I ask respiratory therapist, Toniann Fail, to continue discussions about the realities of intubation.  I will continue to try to build a relationship with this family.  We talk about discontinuation of IV  antibiotics, transfer to the floor soon, and disposition to the skilled nursing home for rehab. I share that it seems she has been accepted into [facility].  Length of Stay: 10  Current Medications: Scheduled Meds:  . antiseptic oral rinse  7 mL Mouth Rinse BID  . antiseptic oral rinse  7 mL Mouth Rinse q12n4p  . apixaban  5 mg Oral BID  . carvedilol  12.5 mg Oral BID WC  . chlorhexidine  15 mL Mouth Rinse BID  . digoxin  0.125 mg Oral Daily  .  insulin aspart  0-20 Units Subcutaneous TID WC  . insulin glargine  7 Units Subcutaneous Daily  . ipratropium-albuterol  3 mL Nebulization TID  . LORazepam  0.5 mg Oral Daily  . methylPREDNISolone (SOLU-MEDROL) injection  60 mg Intravenous Q8H  . pantoprazole  40 mg Oral Daily  . sodium chloride flush  3 mL Intravenous Q12H    Continuous Infusions:    PRN Meds: acetaminophen **OR** [DISCONTINUED] acetaminophen, haloperidol lactate, ipratropium-albuterol, labetalol, nitroGLYCERIN, [DISCONTINUED] ondansetron **OR** ondansetron (ZOFRAN) IV, polyethylene glycol  Physical Exam  Constitutional: No distress.  HENT:  Head: Normocephalic and atraumatic.  Cardiovascular: Normal rate.    A fib, rate 80-90's  Pulmonary/Chest: Effort normal. No respiratory distress.  [place] at this time.   Abdominal: Soft. She exhibits no distension. There is no guarding.  Neurological: She is alert.  Skin: Skin is warm and dry.  Nursing note and vitals reviewed.           Vital Signs: BP 116/56 mmHg  Pulse 93  Temp(Src) 97.4 F (36.3 C) (Axillary)  Resp 21  Ht 5\' 4"  (1.626 m)  Wt 74.5 kg (164 lb 3.9 oz)  BMI 28.18 kg/m2  SpO2 96% SpO2: SpO2: 96 % O2 Device: O2 Device: Nasal Cannula O2 Flow Rate: O2 Flow Rate (L/min): 2 L/min  Intake/output summary:  Intake/Output Summary (Last 24 hours) at 03/19/16 1433 Last data filed at 03/19/16 1415  Gross per 24 hour  Intake    820 ml  Output   1150 ml  Net   -330 ml   LBM: Last BM Date:  03/19/16 Baseline Weight: Weight: 75.751 kg (167 lb) Most recent weight: Weight: 74.5 kg (164 lb 3.9 oz)       Palliative Assessment/Data:    Flowsheet Rows        Most Recent Value   Intake Tab    Referral Department  Hospitalist   Unit at Time of Referral  ICU   Palliative Care Primary Diagnosis  Pulmonary   Date Notified  03/18/16   Palliative Care Type  New Palliative care   Reason for referral  Clarify Goals of Care, Psychosocial or Spiritual support   Date of Admission  03/09/16   Date first seen by Palliative Care  03/18/16   # of days Palliative referral response time  0 Day(s)   # of days IP prior to Palliative referral  9   Clinical Assessment    Palliative Performance Scale Score  20%   Pain Max last 24 hours  Not able to report   Pain Min Last 24 hours  Not able to report   Dyspnea Max Last 24 Hours  Not able to report   Dyspnea Min Last 24 hours  Not able to report   Psychosocial & Spiritual Assessment    Palliative Care Outcomes    Patient/Family meeting held?  Yes   Who was at the meeting?  Patient, daughter Kathy BandyKristi, sister in law [name], neighbor [name]   Palliative Care Outcomes  Clarified goals of care, Provided psychosocial or spiritual support   Palliative Care follow-up planned  -- [Follow-up at [facility]]      Patient Active Problem List   Diagnosis Date Noted  . Mental confusion   . Palliative care encounter   . Goals of care, counseling/discussion   . Confusion   . SOB (shortness of breath)   . Status post thoracentesis   . Pleural effusion, bilateral   . Atrial fibrillation with RVR (HCC)   .  Acute on chronic respiratory failure with hypoxia (HCC) 03/09/2016  . Pleural effusion on left 03/09/2016  . Atrial fibrillation (HCC) 03/09/2016  . Hyperlipidemia   . Type 2 diabetes mellitus (HCC) 12/26/2009  . HYPERLIPIDEMIA 12/26/2009  . Essential hypertension, benign 12/26/2009  . Coronary atherosclerosis of native coronary artery 12/26/2009  . COPD  (chronic obstructive pulmonary disease) (HCC) 12/26/2009    Palliative Care Assessment & Plan   Patient Profile: [name] is a 82 y.o. female with past medical history of Atrial fibrillation with the chads score 5 on aspirin and not on anticoagulation due to a history of G.I. bleed, coronary artery disease with stenting of the RCA and inferior wall MI in 1999, hypertension, type II diabetes, hyperlipidemia, COPD (oxygen dependent) admitted on 03/09/2016 with a fib, shortness of breath, weakness.   Assessment: As above  Recommendations/Plan: continue to treat the treatable, [name] agrees to wear BiPAP at night. She states that BiPAP does not make her feel like she is suffocating, she has no problem using this nightly and PRN.  Goals of Care and Additional Recommendations:  Limitations on Scope of Treatment: [name] shares that at this point, she would want only one round of CPR for her mother, "she's lived a good life", and if this does not work, then let her go.  We do not discuss elective intubation at this point today.  Code Status:    Code Status Orders        Start     Ordered   03/09/16 2020  Full code   Continuous     03/09/16 2020    Code Status History    Date Active Date Inactive Code Status Order ID Comments User Context   This patient has a current code status but no historical code status.    Advance Directive Documentation        Most Recent Value   Type of Advance Directive  Living will   Pre-existing out of facility DNR order (yellow form or pink MOST form)     "MOST" Form in Place?         Prognosis:   < 6 months , likely based on chronic disease burden related to COPD, and coronary dysfunction.  Discharge Planning:  Skilled Nursing Facility for rehab with Palliative care service follow-up, [facility].  Care plan was discussed with Nursing staff, case manager, social worker, and Dr. Kerry Hough.  Thank you for allowing the Palliative  Medicine Team to assist in the care of this patient.   Time In: 0950 Time Out: 1030 Total Time 40 minutes Prolonged Time Billed  no       Greater than 50%  of this time was spent counseling and coordinating care related to the above assessment and plan.  Dove,Tasha A, NP  Please contact Palliative Medicine Team phone at [phone] for questions and concerns.

## 2016-03-19 NOTE — Progress Notes (Signed)
**Note De-identified  Obfuscation** Patient removed from BIPAP and placed on [address]; tolerating well.  RRT to continue to monitor 

## 2016-03-19 NOTE — Progress Notes (Signed)
Subjective:  Coughing with eating this am and O2 sats dropping. Feels hot  Objective:  Vital Signs in the last 24 hours: Temp:  [97 F (36.1 C)-98.5 F (36.9 C)] 97 F (36.1 C) (05/31 0400) Pulse Rate:  [59-106] 92 (05/31 0757) Resp:  [10-27] 18 (05/31 0757) BP: (84-123)/(49-71) 115/65 mmHg (05/31 0757) SpO2:  [91 %-99 %] 99 % (05/31 0757) Weight:  [164 lb 3.9 oz (74.5 kg)] 164 lb 3.9 oz (74.5 kg) (05/31 0500)  Intake/Output from previous day: 05/30 0701 - 05/31 0700 In: 880 [P.O.:480; IV Piggyback:400] Out: 1450 [Urine:1450] Intake/Output from this shift:    Physical Exam: NECK: Without JVD, HJR, or bruit LUNGS: Decreased breath sounds throughout without rales HEART: Irregular rate and rhythm, no murmur, gallop, rub, bruit, thrill, or heave EXTREMITIES: Without cyanosis, clubbing, or edema   Lab Results:  Recent Labs  03/18/16 0441 03/19/16 0519  WBC 7.1 5.7  HGB 11.1* 9.9*  PLT 151 165    Recent Labs  03/18/16 0441 03/19/16 0519  NA 141 138  K 4.3 4.0  CL 80* 82*  CO2 50* 48*  GLUCOSE 192* 214*  BUN 56* 54*  CREATININE 1.13* 0.97   No results for input(s): TROPONINI in the last 72 hours.  Invalid input(s): CK, MB Hepatic Function Panel No results for input(s): PROT, ALBUMIN, AST, ALT, ALKPHOS, BILITOT, BILIDIR, IBILI in the last 72 hours. No results for input(s): CHOL in the last 72 hours. No results for input(s): PROTIME in the last 72 hours.    Cardiac Studies: Study Conclusions   - Left ventricle: The cavity size was normal. Wall thickness was   increased in a pattern of mild LVH. Systolic function was mildly   to moderately reduced. The estimated ejection fraction was in the   range of 40% to 45%. Diffuse hypokinesis. There is akinesis of   the basalinferior myocardium. The study is not technically   sufficient to allow evaluation of LV diastolic function. - Ventricular septum: Septal motion showed abnormal function and   dyssynergy. The  contour showed diastolic flattening. - Aortic valve: Trileaflet; mildly calcified leaflets. Left   coronary cusp mobility was restricted. There was trivial   regurgitation. - Mitral valve: Calcified annulus. There was mild to moderate   regurgitation. - Left atrium: The atrium was moderately dilated. - Right ventricle: The cavity size was severely dilated. Systolic   function was severely reduced. - Right atrium: The atrium was moderately dilated. Central venous   pressure (est): 8 mm Hg. - Tricuspid valve: There was mild regurgitation. - Pulmonary arteries: PA peak pressure: 30 mm Hg (S). - Pericardium, extracardiac: A small pericardial effusion was   identified circumferential to the heart. There was a left pleural   effusion.   Impressions:   - Mild LVH with LVEF 40-45%, diffuse hypokinesis with akinesis of   the basal inferior wall. Indeterminate diastolic function in the   setting of atrial fibrillation. Septal dyssynergy suggesting IVCD   and diastolic septal flattening also noted. Moderate left atrial   enlargement. MAC with mild to moderate mitral regurgitation.   Sclerotic aortic valve with trivial aortic regurgitation.   Severely dilated right ventricle with severely reduced   contraction. Moderate right atrial enlargement. Mild tricuspid   regurgitation with PASP 30 mmHg. Left pleural effusion noted as   well as small pericardial effusion. Assessment/Plan:  1. Persistent atrial fibrillation, heart rate significantly improved. She is currently on Coreg and digoxin. CHADSVASC score is 5. Now on Eliquis-watch closely  with history of GIB. Hbg 9.9 today.Could be dilutional.  2. Bilateral pleural effusions, left greater than right in the setting of acute on chronic combined heart failure with LVEF 40-45%. Lasix on hold b/c of rising Crt-better today .97, also status post left-sided thoracentesis with removal of 1000 cc fluid. Renal function has been stable.  3. Acute on chronic  hypoxic/hypercarbic respiratory failure, improved with BiPAP. She is being followed by Dr. Juanetta GoslingHawkins.  4. Right lower lobe infiltrate concerning for pneumonia. Currently on broad-spectrum antibiotics.  5. Deconditioning.  6. Oxygen-dependent COPD.  7. CAD status post previous RCA interventions in 1999 2001. Troponin I levels normal. No active angina.       LOS: 10 days    Jacolyn ReedyMichele Lenze 03/19/2016, 8:18 AM   Patient seen and discussed with PA Geni BersLenze, I agree with her documentation above. Afib rate controlled with coreg and digozin, she is on eliquis for stroke prevention. She presented with acute on chronic systolic HF, diursed 9.5 liters with bump in Cr and BUN. Diuretics have been on old, renal labs are trending back down. Would hold diuretics today and then start oral 40mg  in AM and 20mg  in PM. Soft bp's and with recent AKI would not start ACE-I/ARB/aldactone at this time, can be reconsidered at follow up.   No further cardiac recs at this time. Would recommend oral lasix starting tomorrrow 40mg  in AM and 20mg  in PM. We will sign off inpatient care, call with questions.  Dominga FerryJ Kahne Helfand MD

## 2016-03-20 ENCOUNTER — Other Ambulatory Visit: Payer: Self-pay

## 2016-03-20 ENCOUNTER — Encounter: Payer: Self-pay | Admitting: Internal Medicine

## 2016-03-20 ENCOUNTER — Inpatient Hospital Stay
Admission: RE | Admit: 2016-03-20 | Discharge: 2016-04-25 | Disposition: A | Discharge status: Home / Self Care | Payer: Medicare Other | Source: Ambulatory Visit | Attending: Internal Medicine | Admitting: Internal Medicine

## 2016-03-20 ENCOUNTER — Non-Acute Institutional Stay (SKILLED_NURSING_FACILITY): Payer: Medicare Other | Admitting: Internal Medicine

## 2016-03-20 ENCOUNTER — Inpatient Hospital Stay (HOSPITAL_COMMUNITY): Payer: Medicare Other

## 2016-03-20 DIAGNOSIS — J9612 Chronic respiratory failure with hypercapnia: Secondary | ICD-10-CM | POA: Diagnosis not present

## 2016-03-20 DIAGNOSIS — I482 Chronic atrial fibrillation, unspecified: Secondary | ICD-10-CM

## 2016-03-20 DIAGNOSIS — M6281 Muscle weakness (generalized): Secondary | ICD-10-CM | POA: Diagnosis not present

## 2016-03-20 DIAGNOSIS — R195 Other fecal abnormalities: Secondary | ICD-10-CM | POA: Diagnosis not present

## 2016-03-20 DIAGNOSIS — I509 Heart failure, unspecified: Secondary | ICD-10-CM | POA: Diagnosis not present

## 2016-03-20 DIAGNOSIS — I110 Hypertensive heart disease with heart failure: Secondary | ICD-10-CM | POA: Diagnosis not present

## 2016-03-20 DIAGNOSIS — E1159 Type 2 diabetes mellitus with other circulatory complications: Secondary | ICD-10-CM

## 2016-03-20 DIAGNOSIS — I5043 Acute on chronic combined systolic (congestive) and diastolic (congestive) heart failure: Secondary | ICD-10-CM

## 2016-03-20 DIAGNOSIS — R197 Diarrhea, unspecified: Secondary | ICD-10-CM | POA: Diagnosis not present

## 2016-03-20 DIAGNOSIS — I480 Paroxysmal atrial fibrillation: Secondary | ICD-10-CM | POA: Diagnosis not present

## 2016-03-20 DIAGNOSIS — J9621 Acute and chronic respiratory failure with hypoxia: Secondary | ICD-10-CM | POA: Diagnosis not present

## 2016-03-20 DIAGNOSIS — Z7901 Long term (current) use of anticoagulants: Secondary | ICD-10-CM | POA: Diagnosis not present

## 2016-03-20 DIAGNOSIS — I2510 Atherosclerotic heart disease of native coronary artery without angina pectoris: Secondary | ICD-10-CM | POA: Diagnosis not present

## 2016-03-20 DIAGNOSIS — D509 Iron deficiency anemia, unspecified: Secondary | ICD-10-CM | POA: Diagnosis not present

## 2016-03-20 DIAGNOSIS — R262 Difficulty in walking, not elsewhere classified: Secondary | ICD-10-CM | POA: Diagnosis not present

## 2016-03-20 DIAGNOSIS — A047 Enterocolitis due to Clostridium difficile: Secondary | ICD-10-CM | POA: Diagnosis not present

## 2016-03-20 DIAGNOSIS — D649 Anemia, unspecified: Secondary | ICD-10-CM | POA: Diagnosis not present

## 2016-03-20 DIAGNOSIS — J9690 Respiratory failure, unspecified, unspecified whether with hypoxia or hypercapnia: Secondary | ICD-10-CM | POA: Diagnosis not present

## 2016-03-20 DIAGNOSIS — J449 Chronic obstructive pulmonary disease, unspecified: Secondary | ICD-10-CM | POA: Diagnosis not present

## 2016-03-20 DIAGNOSIS — M47894 Other spondylosis, thoracic region: Secondary | ICD-10-CM | POA: Diagnosis not present

## 2016-03-20 DIAGNOSIS — J9611 Chronic respiratory failure with hypoxia: Secondary | ICD-10-CM | POA: Diagnosis not present

## 2016-03-20 DIAGNOSIS — R0602 Shortness of breath: Secondary | ICD-10-CM | POA: Diagnosis not present

## 2016-03-20 DIAGNOSIS — I5040 Unspecified combined systolic (congestive) and diastolic (congestive) heart failure: Secondary | ICD-10-CM | POA: Insufficient documentation

## 2016-03-20 DIAGNOSIS — E1165 Type 2 diabetes mellitus with hyperglycemia: Secondary | ICD-10-CM | POA: Diagnosis not present

## 2016-03-20 DIAGNOSIS — E785 Hyperlipidemia, unspecified: Secondary | ICD-10-CM | POA: Diagnosis not present

## 2016-03-20 DIAGNOSIS — J948 Other specified pleural conditions: Secondary | ICD-10-CM | POA: Diagnosis not present

## 2016-03-20 DIAGNOSIS — A09 Infectious gastroenteritis and colitis, unspecified: Secondary | ICD-10-CM | POA: Diagnosis not present

## 2016-03-20 DIAGNOSIS — I6522 Occlusion and stenosis of left carotid artery: Secondary | ICD-10-CM | POA: Diagnosis not present

## 2016-03-20 DIAGNOSIS — Z9981 Dependence on supplemental oxygen: Secondary | ICD-10-CM | POA: Diagnosis not present

## 2016-03-20 DIAGNOSIS — I10 Essential (primary) hypertension: Secondary | ICD-10-CM | POA: Diagnosis not present

## 2016-03-20 DIAGNOSIS — J45909 Unspecified asthma, uncomplicated: Secondary | ICD-10-CM | POA: Diagnosis not present

## 2016-03-20 DIAGNOSIS — J441 Chronic obstructive pulmonary disease with (acute) exacerbation: Secondary | ICD-10-CM | POA: Diagnosis not present

## 2016-03-20 DIAGNOSIS — K2971 Gastritis, unspecified, with bleeding: Secondary | ICD-10-CM | POA: Diagnosis not present

## 2016-03-20 DIAGNOSIS — K1379 Other lesions of oral mucosa: Secondary | ICD-10-CM | POA: Diagnosis not present

## 2016-03-20 DIAGNOSIS — I4891 Unspecified atrial fibrillation: Secondary | ICD-10-CM | POA: Diagnosis not present

## 2016-03-20 DIAGNOSIS — R600 Localized edema: Secondary | ICD-10-CM | POA: Diagnosis not present

## 2016-03-20 DIAGNOSIS — J438 Other emphysema: Secondary | ICD-10-CM | POA: Diagnosis not present

## 2016-03-20 DIAGNOSIS — I5042 Chronic combined systolic (congestive) and diastolic (congestive) heart failure: Secondary | ICD-10-CM | POA: Diagnosis not present

## 2016-03-20 LAB — CBC
HCT: 31.8 % — ABNORMAL LOW (ref 36.0–46.0)
Hemoglobin: 9.8 g/dL — ABNORMAL LOW (ref 12.0–15.0)
MCH: 30.2 pg (ref 26.0–34.0)
MCHC: 30.8 g/dL (ref 30.0–36.0)
MCV: 97.8 fL (ref 78.0–100.0)
PLATELETS: 182 10*3/uL (ref 150–400)
RBC: 3.25 MIL/uL — AB (ref 3.87–5.11)
RDW: 13.9 % (ref 11.5–15.5)
WBC: 5.4 10*3/uL (ref 4.0–10.5)

## 2016-03-20 LAB — BLOOD GAS, ARTERIAL
Acid-Base Excess: 18.7 mmol/L — ABNORMAL HIGH (ref 0.0–2.0)
BICARBONATE: 41.7 meq/L — AB (ref 20.0–24.0)
Delivery systems: POSITIVE
Drawn by: 213101
EXPIRATORY PAP: 5
FIO2: 35
INSPIRATORY PAP: 20
O2 Saturation: 93.4 %
Patient temperature: 37
TCO2: 12.2 mmol/L (ref 0–100)
pCO2 arterial: 63.7 mmHg (ref 35.0–45.0)
pH, Arterial: 7.455 — ABNORMAL HIGH (ref 7.350–7.450)
pO2, Arterial: 64.7 mmHg — ABNORMAL LOW (ref 80.0–100.0)

## 2016-03-20 LAB — BASIC METABOLIC PANEL
ANION GAP: 7 (ref 5–15)
BUN: 48 mg/dL — AB (ref 4–21)
BUN: 48 mg/dL — ABNORMAL HIGH (ref 6–20)
CALCIUM: 8.4 mg/dL — AB (ref 8.9–10.3)
CO2: 42 mmol/L — AB (ref 22–32)
CREATININE: 0.7 mg/dL (ref <=1.1)
Chloride: 86 mmol/L — ABNORMAL LOW (ref 101–111)
Creatinine, Ser: 0.67 mg/dL (ref 0.44–1.00)
GFR calc non Af Amer: >60 mL/min (ref >60)
Glucose, Bld: 210 mg/dL — ABNORMAL HIGH (ref 65–99)
Glucose: 210 mg/dL
Potassium: 3.8 mmol/L (ref 3.5–5.1)
SODIUM: 135 mmol/L — AB (ref 137–147)
Sodium: 135 mmol/L (ref 135–145)

## 2016-03-20 LAB — GLUCOSE, CAPILLARY
GLUCOSE-CAPILLARY: 178 mg/dL — AB (ref 65–99)
Glucose-Capillary: 277 mg/dL — ABNORMAL HIGH (ref 65–99)

## 2016-03-20 MED ORDER — LORAZEPAM 1 MG PO TABS: 0.5000 mg | ORAL_TABLET | Freq: Every day | ORAL | Status: DC

## 2016-03-20 MED ORDER — FUROSEMIDE 20 MG PO TABS: 20.0000 mg | ORAL_TABLET | Freq: Every day | ORAL | Status: DC

## 2016-03-20 MED ORDER — FUROSEMIDE 40 MG PO TABS: 20.0000 mg | ORAL_TABLET | Freq: Two times a day (BID) | ORAL | Status: DC

## 2016-03-20 MED ORDER — IPRATROPIUM-ALBUTEROL 0.5-2.5 (3) MG/3ML IN SOLN: 3.0000 mL | Freq: Three times a day (TID) | RESPIRATORY_TRACT | Status: DC

## 2016-03-20 MED ORDER — PREDNISONE 20 MG PO TABS: ORAL_TABLET | ORAL | Status: DC

## 2016-03-20 MED ORDER — LORAZEPAM 0.5 MG PO TABS: 0.5000 mg | ORAL_TABLET | Freq: Every day | ORAL | Status: DC

## 2016-03-20 MED ORDER — FUROSEMIDE 20 MG PO TABS
40.0000 mg | ORAL_TABLET | Freq: Every day | ORAL | Status: DC
  Administered 2016-03-20: 40 mg via ORAL
  Filled 2016-03-20: qty 2

## 2016-03-20 MED ORDER — DIGOXIN 125 MCG PO TABS: 0.1250 mg | ORAL_TABLET | Freq: Every day | ORAL | Status: DC

## 2016-03-20 MED ORDER — APIXABAN 5 MG PO TABS: 5.0000 mg | ORAL_TABLET | Freq: Two times a day (BID) | ORAL | Status: DC

## 2016-03-20 MED ORDER — CARVEDILOL 12.5 MG PO TABS
12.5000 mg | ORAL_TABLET | Freq: Two times a day (BID) | ORAL | Status: DC
Start: 2016-03-20 — End: 2016-06-12

## 2016-03-20 NOTE — Assessment & Plan Note (Signed)
Continue Eliquis but monitor for any bleeding because of past medical history of GI bleed

## 2016-03-20 NOTE — Assessment & Plan Note (Signed)
BiPAP daily at bedtime with IPAP of 20 and EPAP of 5; Tidal volume 600 cc; Rate 16

## 2016-03-20 NOTE — Assessment & Plan Note (Addendum)
The hyperglycemia should resolve as the prednisone is weaned. Monitor fasting blood sugars; if they stay over 200 low-dose basal insulin (Lantus) will be initiated at a dose of 10 mg at bedtime A1c was prediabetic 5/29

## 2016-03-20 NOTE — NC FL2 (Signed)
MEDICAID FL2 LEVEL OF CARE SCREENING TOOL     IDENTIFICATION  Patient Name: Kathy CuriaVirginia L Pegues Birthdate: 03/25/1931 Sex: female Admission Date (Current Location): 03/09/2016  [facility] [place]   Facility and Address:  [facility],  [address], [place] [id number]      Provider Number: [id number]  Attending Physician Name and Address:  Erick BlinksJehanzeb Memon, MD  Relative Name and Phone Number:       Current Level of Care: Hospital Recommended Level of Care: Skilled Nursing Facility Prior Approval Number:    Date Approved/Denied:   PASRR Number: [id number]  Discharge Plan: SNF    Current Diagnoses: Patient Active Problem List   Diagnosis Date Noted  . Mental confusion   . Palliative care encounter   . Goals of care, counseling/discussion   . Confusion   . SOB (shortness of breath)   . Status post thoracentesis   . Pleural effusion, bilateral   . Atrial fibrillation with RVR (HCC)   . Acute on chronic respiratory failure with hypoxia (HCC) 03/09/2016  . Pleural effusion on left 03/09/2016  . Atrial fibrillation (HCC) 03/09/2016  . Hyperlipidemia   . Type 2 diabetes mellitus (HCC) 12/26/2009  . HYPERLIPIDEMIA 12/26/2009  . Essential hypertension, benign 12/26/2009  . Coronary atherosclerosis of native coronary artery 12/26/2009  . COPD (chronic obstructive pulmonary disease) (HCC) 12/26/2009    Orientation RESPIRATION BLADDER Height & Weight     Self, Time, Situation, Place  O2 (2 L) Indwelling catheter Weight: 164 lb 3.9 oz (74.5 kg) Height:  5\' 4"  (162.6 cm)  BEHAVIORAL SYMPTOMS/MOOD NEUROLOGICAL BOWEL NUTRITION STATUS  Other (Comment) (n/a)  (n/a) Continent Diet (heart healthy/carb modified)  AMBULATORY STATUS COMMUNICATION OF NEEDS Skin   Total Care Verbally Bruising                       Personal Care Assistance Level of Assistance  Bathing, Feeding, Dressing Bathing Assistance: Maximum assistance Feeding  assistance: Limited assistance Dressing Assistance: Maximum assistance     Functional Limitations Info  Sight, Hearing, Speech Sight Info: Adequate Hearing Info: Adequate Speech Info: Adequate    SPECIAL CARE FACTORS FREQUENCY  PT (By licensed PT)     PT Frequency: 5              Contractures Contractures Info: Not present    Additional Factors Info  Insulin Sliding Scale Code Status Info: Full code Allergies Info: Codeine, Heparin           Current Medications (03/20/2016):  This is the current hospital active medication list Current Facility-Administered Medications  Medication Dose Route Frequency Provider Last Rate Last Dose  . acetaminophen (TYLENOL) tablet 650 mg  650 mg Oral Q6H PRN Levie HeritageJacob J Stinson, DO   650 mg at 03/16/16 2227  . antiseptic oral rinse (CPC / CETYLPYRIDINIUM CHLORIDE 0.05%) solution 7 mL  7 mL Mouth Rinse BID Jerald KiefStephen K Chiu, MD   7 mL at 03/19/16 2100  . antiseptic oral rinse (CPC / CETYLPYRIDINIUM CHLORIDE 0.05%) solution 7 mL  7 mL Mouth Rinse q12n4p Jerald KiefStephen K Chiu, MD   7 mL at 03/19/16 1600  . apixaban (ELIQUIS) tablet 5 mg  5 mg Oral BID Jonelle SidleSamuel G McDowell, MD   5 mg at 03/20/16 0947  . carvedilol (COREG) tablet 12.5 mg  12.5 mg Oral BID WC Jonelle SidleSamuel G McDowell, MD   12.5 mg at 03/19/16 0839  . chlorhexidine (PERIDEX) 0.12 % solution 15  mL  15 mL Mouth Rinse BID Jerald Kief, MD   15 mL at 03/20/16 0947  . digoxin (LANOXIN) tablet 0.125 mg  0.125 mg Oral Daily Jonelle Sidle, MD   0.125 mg at 03/20/16 0947  . furosemide (LASIX) tablet 20 mg  20 mg Oral q1800 Erick Blinks, MD      . furosemide (LASIX) tablet 40 mg  40 mg Oral Daily Erick Blinks, MD   40 mg at 03/20/16 1145  . haloperidol lactate (HALDOL) injection 1 mg  1 mg Intravenous Q6H PRN Leroy Sea, MD      . insulin aspart (novoLOG) injection 0-20 Units  0-20 Units Subcutaneous TID WC Jerald Kief, MD   11 Units at 03/20/16 1146  . insulin glargine (LANTUS) injection 7 Units   7 Units Subcutaneous Daily Jerald Kief, MD   7 Units at 03/20/16 [phone]  . ipratropium-albuterol (DUONEB) 0.5-2.5 (3) MG/3ML nebulizer solution 3 mL  3 mL Nebulization Q6H PRN Jerald Kief, MD      . ipratropium-albuterol (DUONEB) 0.5-2.5 (3) MG/3ML nebulizer solution 3 mL  3 mL Nebulization TID Erick Blinks, MD   3 mL at 03/20/16 0930  . labetalol (NORMODYNE,TRANDATE) injection 5 mg  5 mg Intravenous Q10 min PRN Jerald Kief, MD   5 mg at 03/17/16 1235  . LORazepam (ATIVAN) tablet 0.5 mg  0.5 mg Oral Daily Rhona Raider Stinson, DO   0.5 mg at 03/20/16 0947  . methylPREDNISolone sodium succinate (SOLU-MEDROL) 125 mg/2 mL injection 60 mg  60 mg Intravenous Q8H Jerald Kief, MD   60 mg at 03/20/16 0541  . nitroGLYCERIN (NITROSTAT) SL tablet 0.4 mg  0.4 mg Sublingual Q5 min PRN Rhona Raider Stinson, DO      . ondansetron [facility]) injection 4 mg  4 mg Intravenous Q6H PRN Rhona Raider Stinson, DO   4 mg at 03/17/16 1010  . pantoprazole (PROTONIX) EC tablet 40 mg  40 mg Oral Daily Rhona Raider Stinson, DO   40 mg at 03/20/16 0947  . polyethylene glycol (MIRALAX / GLYCOLAX) packet 17 g  17 g Oral Daily PRN Rhona Raider Stinson, DO      . sodium chloride flush (NS) 0.9 % injection 3 mL  3 mL Intravenous Q12H Rhona Raider Stinson, DO   3 mL at 03/18/16 0848     Discharge Medications: Please see discharge summary for a list of discharge medications.  Relevant Imaging Results:  Relevant Lab Results:   Additional Information SS#: [id number]. Bipap at night.   Derenda Fennel [place], [place] [phone]

## 2016-03-20 NOTE — Discharge Summary (Signed)
Physician Discharge Summary  [name] NWG:[id number] DOB: 05-28-1931 DOA: 03/09/2016  PCP: [name], MD  Admit date: 03/09/2016 Discharge date: 03/20/2016  Time spent: 35 minutes  Recommendations for Outpatient Follow-up:  1. Follow up with PCP in 1-2 weeks.  2. Follow up with cardiology in 2 weeks.  3. Patient will need BiPAP QHS with IPAP of 20 and EPAP of 5. Tidal volume of 600 and respiratory rate of 16.    Discharge Diagnoses:  Principal Problem:   Acute on chronic respiratory failure with hypoxia (HCC) Active Problems:   Type 2 diabetes mellitus (HCC)   COPD (chronic obstructive pulmonary disease) (HCC)   Pleural effusion on left   Atrial fibrillation (HCC)   Status post thoracentesis   Pleural effusion, bilateral   Atrial fibrillation with RVR (HCC)   Confusion   SOB (shortness of breath)   Mental confusion   Palliative care encounter   Goals of care, counseling/discussion   Discharge Condition: Improved   Diet recommendation: Heart healthy   Filed Weights   03/18/16 0500 03/19/16 0500 03/20/16 0500  Weight: 73.3 kg (161 lb 9.6 oz) 74.5 kg (164 lb 3.9 oz) 74.5 kg (164 lb 3.9 oz)    History of present illness:  82 yof with history of afib and CHADS score of 5 on ASA and no anticoagulation due to history of bleeding, CAD with stenting of the RCA and history of inferior wall MI in 1999, HTN, DM Type 2, HLD. She presented with complaints of weakness and fatigue, which was found to be acute CHF with bilateral pleural effusion. She was referred for admission.   Hospital Course:  Patient was admitted for acute on chronic respiratory failure with hypoxia due to bilateral pleural effusions/decompensated CHF and possible COPD exacebtion. S/p left-sided thoracentesis with 1 L transudative fluid removal on 03/10/2016. Patient was diuresed with IV lasix and volume status -11L. Discharge weight 74.5kg. She was seen by cardiology, who adjusted he medications and have  transitioned her to oral lasix, She will need close follow up with cardiology in the next two weeks. Patient also developed hypercarbic failure, requiring transfer to SDU and bipap support on 5/23. This was felt related to CHF/COPD. She was seen by pulmonology, who recommended BiPAP QHS. Since being started on BiPAP she has clinically improved and will need to be on QHS BiPAP long-term. Currently breathing comfortably on [place]. Due to her multiple medical problems, PMT consulted for goals of care discussion. At this time all available treatments are requested including CPR.  1. Possible aspiration pneumonia. Resolved. She remained afebrile and hemodynamically stable. She has completed a course of abx.  2. Atrial fibrillation, [place] score 5. Started on Eliquis. Continue on Coreg and digoxin.  3. Acute on chronic combined systolic and diastolic CHF with EF 40-45% with wall motion abnormality. Patient was diuresed with IV lasix and has since transitioned to oral lasix. Appreciate cardiology input.  4. COPD exacerbation. Improved. She was treated with IV steroids and has since been transitioned to prednisone taper. Continue nebs. She has competed a course of abx.  5. DM Type 2, diet controlled. Continue SSI.  Procedures:  Left-sided thoracentesis with 1L transudative fluid removed 5/22  ECHO Study Conclusions  - Left ventricle: The cavity size was normal. Wall thickness was  increased in a pattern of mild LVH. Systolic function was mildly  to moderately reduced. The estimated ejection fraction was in the  range of 40% to 45%. Diffuse hypokinesis. There is akinesis of  the basalinferior myocardium. The study is not technically  sufficient to allow evaluation of LV diastolic function. - Ventricular septum: Septal motion showed abnormal function and  dyssynergy. The contour showed diastolic flattening. - Aortic valve: Trileaflet; mildly calcified leaflets. Left  coronary cusp mobility was  restricted. There was trivial  regurgitation. - Mitral valve: Calcified annulus. There was mild to moderate  regurgitation. - Left atrium: The atrium was moderately dilated. - Right ventricle: The cavity size was severely dilated. Systolic  function was severely reduced. - Right atrium: The atrium was moderately dilated. Central venous  pressure (est): 8 mm Hg. - Tricuspid valve: There was mild regurgitation. - Pulmonary arteries: PA peak pressure: 30 mm Hg (S). - Pericardium, extracardiac: A small pericardial effusion was  identified circumferential to the heart. There was a left pleural  effusion. Consultations:  Pulmonary   Cardiology   Palliative   PT-SNF  Discharge Exam: Filed Vitals:   03/20/16 0900 03/20/16 0947  BP: 128/62   Pulse:  73  Temp:    Resp: 23      General: NAD, looks comfortable  Cardiovascular: RRR,   Respiratory: clear bilaterally, No wheezing, rales. Rhonchi bilaterally   Abdomen: soft, non tender, no distention , bowel sounds normal  Musculoskeletal: 1+ edema b/l   Discharge Instructions   Discharge Instructions    Diet - low sodium heart healthy    Complete by:  As directed      Increase activity slowly    Complete by:  As directed           Current Discharge Medication List    START taking these medications   Details  apixaban (ELIQUIS) 5 MG TABS tablet Take 1 tablet (5 mg total) by mouth 2 (two) times daily. Qty: 60 tablet, Refills: 0    carvedilol (COREG) 12.5 MG tablet Take 1 tablet (12.5 mg total) by mouth 2 (two) times daily with a meal.    digoxin (LANOXIN) 0.125 MG tablet Take 1 tablet (0.125 mg total) by mouth daily.    predniSONE (DELTASONE) 20 MG tablet Take 40mg  po daily for 2 days then 30mg  daily for 2 days then 20mg  daily for 2 days then 10mg  daily for 2 days then stop      CONTINUE these medications which have CHANGED   Details  furosemide (LASIX) 40 MG tablet Take 0.5-1 tablets (20-40 mg total)  by mouth 2 (two) times daily. 40mg  qam and 20mg  qpm Qty: 30 tablet    ipratropium-albuterol (DUONEB) 0.5-2.5 (3) MG/3ML SOLN Take 3 mLs by nebulization 3 (three) times daily. Qty: 360 mL      CONTINUE these medications which have NOT CHANGED   Details  acetaminophen (TYLENOL) 500 MG tablet Take 1,000 mg by mouth every 6 (six) hours as needed for mild pain.    LORazepam (ATIVAN) 1 MG tablet Take 0.5 mg by mouth daily.     omeprazole (PRILOSEC) 20 MG capsule Take 20 mg by mouth at bedtime.     atorvastatin (LIPITOR) 10 MG tablet Take 10 mg by mouth daily. Reported on 12/04/2015    nitroGLYCERIN (NITROSTAT) 0.4 MG SL tablet Place 1 tablet (0.4 mg total) under the tongue every 5 (five) minutes as needed for chest pain. Qty: 25 tablet, Refills: 3    NON FORMULARY Pt on 2.5 liters of O2 daily      STOP taking these medications     aspirin EC 81 MG tablet      diltiazem (CARDIZEM CD) 180 MG  24 hr capsule        Allergies  Allergen Reactions  . Codeine Nausea Only  . Heparin Other (See Comments)    Causes internal bleeding       The results of significant diagnostics from this hospitalization (including imaging, microbiology, ancillary and laboratory) are listed below for reference.    Significant Diagnostic Studies: Dg Chest 1 View  03/10/2016  CLINICAL DATA:  LEFT pleural effusion post thoracentesis EXAM: CHEST 1 VIEW COMPARISON:  03/09/2016 FINDINGS: Expiratory technique. Significant decrease in LEFT pleural effusion post thoracentesis. No pneumothorax. Persistent small to moderate LEFT pleural effusion and basilar atelectasis identified. Small RIGHT pleural effusion also noted. Patient rotated to the LEFT. Dense atherosclerotic calcification and aorta. Question enlargement of cardiac silhouette. Diffuse osseous demineralization. IMPRESSION: Significant decrease in LEFT pleural effusion post thoracentesis. No pneumothorax. Electronically Signed   By: Ulyses SouthwardMark  Boles M.D.   On:  03/10/2016 13:57   Dg Chest 2 View  03/09/2016  CLINICAL DATA:  Shortness of breath. Weakness. Atrial fibrillation. EXAM: CHEST  2 VIEW COMPARISON:  02/11/2016 FINDINGS: The large left and moderate right pleural effusion. Only about 25% of the left lung is aerated. Left heart border obscured.  Suspected underlying cardiomegaly. Atherosclerotic aortic arch. Thoracic spondylosis. Indistinct pulmonary vasculature. Bony demineralization. IMPRESSION: 1. Large left and moderate right pleural effusions. Only about 25% of the left lung is aerated. 2. Atherosclerosis. 3. Thoracic spondylosis. 4. Indistinct pulmonary vasculature could reflect pulmonary venous hypertension. Electronically Signed   By: Gaylyn RongWalter  Liebkemann M.D.   On: 03/09/2016 17:29   Dg Chest Port 1 View  03/20/2016  CLINICAL DATA:  Respiratory failure. EXAM: PORTABLE CHEST 1 VIEW COMPARISON:  03/18/2016. FINDINGS: Mediastinum hilar structures are unremarkable. Cardiomegaly with progressive bilateral pulmonary interstitial prominence suggesting congestive heart failure. Persistent low lung volumes with left lower lobe atelectasis and/or consolidation. Bilateral pleural effusions left side greater than right again noted. No pneumothorax. IMPRESSION: 1. Cardiomegaly with progressive diffuse bilateral from interstitial prominence consistent with congestive heart failure. Bilateral pleural effusions again noted, left side greater than right. 2.  Left lower lobe atelectasis and/or consolidation. Electronically Signed   By: Maisie Fushomas  Register   On: 03/20/2016 07:05   Dg Chest Port 1 View  03/18/2016  CLINICAL DATA:  Difficulty breathing EXAM: PORTABLE CHEST 1 VIEW COMPARISON:  03/15/2016 FINDINGS: Cardiac shadow is stable. Bilateral pleural effusions are again seen and stable. Left basilar consolidation is again noted. Aortic calcifications are again seen. No acute bony abnormality is noted. IMPRESSION: Bilateral pleural effusions left greater than right. Left  basilar consolidation is noted. Electronically Signed   By: Alcide CleverMark  Lukens M.D.   On: 03/18/2016 07:12   Dg Chest Port 1 View  03/15/2016  CLINICAL DATA:  82 year old female with history of congestive heart failure. Weakness and fatigue. EXAM: PORTABLE CHEST 1 VIEW COMPARISON:  Chest x-ray 03/12/2016. FINDINGS: Moderate to large left pleural effusion. Probable passive atelectasis throughout the base of the left lung. Small to moderate right pleural effusion. Probable passive subsegmental atelectasis in the right lower lobe. Mild pulmonary venous congestion, without frank pulmonary edema. Cardiomegaly. The patient is rotated to the left on today's exam, resulting in distortion of the mediastinal contours and reduced diagnostic sensitivity and specificity for mediastinal pathology. Atherosclerosis in the thoracic aorta. IMPRESSION: 1. Cardiomegaly with pulmonary venous congestion, but no frank pulmonary edema. 2. Moderate to large left and small to moderate right pleural effusions with persistent bibasilar atelectasis, as above. 3. Atherosclerosis. Electronically Signed   By: Reuel Boomaniel  Entrikin M.D.   On: 03/15/2016 07:48   Dg Chest Port 1 View  03/12/2016  CLINICAL DATA:  Respiratory failure EXAM: PORTABLE CHEST 1 VIEW COMPARISON:  Chest radiograph from one day prior. FINDINGS: Stable cardiomediastinal silhouette with mild cardiomegaly. No pneumothorax. Stable moderate left pleural effusion. Stable small right pleural effusion. Mild pulmonary edema is not appreciably changed. Patchy left greater than right bilateral lung opacities appear stable. IMPRESSION: Stable chest radiograph with mild congestive heart failure, moderate left and small right pleural effusions and left greater than right bibasilar lung opacities favoring atelectasis. Electronically Signed   By: Delbert Phenix M.D.   On: 03/12/2016 10:20   Dg Chest Port 1 View  03/11/2016  CLINICAL DATA:  Shortness of breath and weakness EXAM: PORTABLE CHEST 1  VIEW COMPARISON:  03/10/2016 FINDINGS: Cardiac shadow is stable. Some slight increase in the degree of left pleural effusion is noted from the prior exam. Increased density is also noted in the right base likely related to early infiltrate. No pneumothorax is seen. No bony abnormality is noted. IMPRESSION: Slight increase in left pleural effusion with new right basilar infiltrate. Electronically Signed   By: Alcide Clever M.D.   On: 03/11/2016 20:07   US Thoracentesis Asp Pleural Space W/img Guide  03/10/2016  INDICATION: LEFT pleural effusion EXAM: ULTRASOUND GUIDED DIAGNOSTIC AND THERAPEUTIC LEFT THORACENTESIS MEDICATIONS: None. COMPLICATIONS: None immediate. PROCEDURE: Procedure, benefits, and risks of procedure were discussed with patient. Written informed consent for procedure was obtained. Time out protocol followed. Pleural effusion localized by ultrasound at the posterior LEFT hemithorax. Skin prepped and draped in usual sterile fashion. Skin and soft tissues anesthetized with 10 mL of 1% lidocaine. 8 French thoracentesis catheter placed into the 1000 mL pleural space. 1000 mL of amber color fluid was aspirated by syringe pump. Procedure tolerated well by patient without immediate complication. FINDINGS: A total of approximately 1000 mL of LEFT pleural fluid was removed. 180 mL of fluid was sent to the laboratory as requested by the clinical team for analysis. IMPRESSION: Successful ultrasound guided LEFT thoracentesis yielding 1000 mL of pleural fluid. Electronically Signed   By: Ulyses Southward M.D.   On: 03/10/2016 14:08    Microbiology: Recent Results (from the past 240 hour(s))  Culture, body fluid-bottle     Status: None   Collection Time: 03/10/16  1:00 PM  Result Value Ref Range Status   Specimen Description FLUID LEFT PLEURAL COLLECTED BY DOCTOR BOLES  Final   Special Requests BOTTLES DRAWN AEROBIC AND ANAEROBIC 10CC EACH  Final   Culture NO GROWTH 5 DAYS  Final   Report Status 03/15/2016  FINAL  Final  Gram stain     Status: None   Collection Time: 03/10/16  1:00 PM  Result Value Ref Range Status   Specimen Description FLUID LEFT PLEURAL COLLECTED BY DOCTOR BOLES  Final   Special Requests NONE  Final   Gram Stain   Final    CYTOSPIN SMEAR WBC PRESENT, PREDOMINANTLY MONONUCLEAR NO ORGANISMS SEEN Performed at [facility]    Report Status 03/10/2016 FINAL  Final  MRSA PCR Screening     Status: None   Collection Time: 03/11/16 11:42 PM  Result Value Ref Range Status   MRSA by PCR NEGATIVE NEGATIVE Final    Comment:        The GeneXpert MRSA Assay (FDA approved for NASAL specimens only), is one component of a comprehensive MRSA colonization surveillance program. It is not intended to diagnose MRSA infection nor  to guide or monitor treatment for MRSA infections.      Labs: Basic Metabolic Panel:  Recent Labs Lab 03/14/16 0518  03/16/16 1610 03/17/16 0557 03/18/16 0441 03/19/16 0519 03/20/16 0454  NA 138  < > 138 138 141 138 135  K 3.4*  < > 3.4* 3.9 4.3 4.0 3.8  CL 81*  < > 79* 81* 80* 82* 86*  CO2 49*  < > 50* 49* 50* 48* 42*  GLUCOSE 230*  < > 263* 269* 192* 214* 210*  BUN 36*  < > 48* 45* 56* 54* 48*  CREATININE 0.75  < > 0.84 0.87 1.13* 0.97 0.67  CALCIUM 8.3*  < > 8.3* 8.1* 8.1* 8.4* 8.4*  MG 1.9  --   --   --   --   --   --   < > = values in this interval not displayed. Liver Function Tests: No results for input(s): AST, ALT, ALKPHOS, BILITOT, PROT, ALBUMIN in the last 168 hours. No results for input(s): LIPASE, AMYLASE in the last 168 hours. No results for input(s): AMMONIA in the last 168 hours. CBC:  Recent Labs Lab 03/15/16 0507 03/18/16 0441 03/19/16 0519 03/20/16 0454  WBC 4.1 7.1 5.7 5.4  HGB 10.9* 11.1* 9.9* 9.8*  HCT 35.6* 38.1 34.0* 31.8*  MCV 98.3 102.4* 102.4* 97.8  PLT 119* 151 165 182   Cardiac Enzymes: No results for input(s): CKTOTAL, CKMB, CKMBINDEX, TROPONINI in the last 168 hours. BNP: BNP (last 3  results)  Recent Labs  03/09/16 1640 03/10/16 0625  BNP 435.0* 393.0*    ProBNP (last 3 results) No results for input(s): PROBNP in the last 8760 hours.  CBG:  Recent Labs Lab 03/18/16 2145 03/19/16 0723 03/19/16 1121 03/19/16 1628 03/20/16 0736  GLUCAP 230* 195* 271* 201* 178*       Signed:  MEMON,JEHANZEB MD.  [facility] 03/20/2016, 11:11 AM     By signing my name below, I, Zadie Cleverly, attest that this documentation has been prepared under the direction and in the presence of Erick Blinks, MD. Electronically signed: Zadie Cleverly, Scribe. 03/20/2016 10:39am   I, Dr. Erick Blinks, personally performed the services described in this documentaiton. All medical record entries made by the scribe were at my direction and in my presence. I have reviewed the chart and agree that the record reflects my personal performance and is accurate and complete  Erick Blinks, MD, 03/20/2016 11:11 AM

## 2016-03-20 NOTE — Telephone Encounter (Signed)
Rx faxed to [facility] at [phone].   [address] [place], [place] [address]  Phone #: [phone] 

## 2016-03-20 NOTE — Progress Notes (Signed)
Report called to [facility]. Patient to be transported via wheelchair.

## 2016-03-20 NOTE — Care Management Note (Signed)
Case Management Note  Patient Details  Name: [name] MRN: [id number] Date of Birth: 02/06/1931  Expected Discharge Date:  03/11/16               Expected Discharge Plan:  Skilled Nursing Facility  In-House Referral:  Clinical Social Work, Hospice / Palliative Care  Discharge planning Services  CM Consult  Post Acute Care Choice:  NA Choice offered to:  NA  DME Arranged:    DME Agency:     HH Arranged:    HH Agency:     Status of Service:  Completed, signed off  Medicare Important Message Given:  Yes Date Medicare IM Given:    Medicare IM give by:    Date Additional Medicare IM Given:    Additional Medicare Important Message give by:     If discussed at Long Length of Stay Meetings, dates discussed:  03/20/2016  Additional Comments: Pt discharging today to SNF. CSW has arranged for placement. Pt/family aware of DC plan. No CM needs.   Malcolm Metrohildress, Lashara Urey Demske, RN 03/20/2016, 2:17 PM

## 2016-03-20 NOTE — Patient Instructions (Signed)
Wound Care Nurse to assess definitive measurements and description in wound care notes of buttocks lesion reported by patient. Fasting blood sugar each morning. If fasting blood sugar is over 200; please give 10 units of Lantus at bedtime that day

## 2016-03-20 NOTE — Clinical Social Work Placement (Addendum)
   CLINICAL SOCIAL WORK PLACEMENT  NOTE  Date:  03/20/2016  Patient Details  Name: [name] MRN: [id number] Date of Birth: 07/01/1931  Clinical Social Work is seeking post-discharge placement for this patient at the Skilled  Nursing Facility level of care (*CSW will initial, date and re-position this form in  chart as items are completed):  Yes   Patient/family provided with [facility] Clinical Social Work Department's list of facilities offering this level of care within the geographic area requested by the patient (or if unable, by the patient's family).  Yes   Patient/family informed of their freedom to choose among providers that offer the needed level of care, that participate in Medicare, Medicaid or managed care program needed by the patient, have an available bed and are willing to accept the patient.  Yes   Patient/family informed of [facility]'s ownership interest in [facility] and [facility], as well as of the fact that they are under no obligation to receive care at these facilities.  PASRR submitted to EDS on 03/11/16     PASRR number received on 03/11/16     Existing PASRR number confirmed on       FL2 transmitted to all facilities in geographic area requested by pt/family on 03/11/16     FL2 transmitted to all facilities within larger geographic area on       Patient informed that his/her managed care company has contracts with or will negotiate with certain facilities, including the following:        Yes   Patient/family informed of bed offers received.  [facility]     Physician recommends and patient chooses bed at      [facility] to [facility] on 03/20/16.  Patient to be transferred to facility by staff     Patient family notified on 03/20/16 of transfer.  Name of family member notified:  [name]- daughter     PHYSICIAN       Additional Comment:     _______________________________________________ Karn CassisStultz, Rim [place], LCSW 03/20/2016, 1:03 PM [phone]

## 2016-03-20 NOTE — Progress Notes (Signed)
Subjective: She looks much better. She is much more alert and awake than when I saw her earlier this week. She has had acute on chronic hypoxic hypercapnic respiratory failure on the basis of COPD exacerbation and a large pleural effusion and acute on chronic heart failure. She has been on BiPAP and has improved.  Objective: Vital signs in last 24 hours: Temp:  [97 F (36.1 C)-98.4 F (36.9 C)] 98.4 F (36.9 C) (06/01 0400) Pulse Rate:  [29-114] 66 (06/01 0600) Resp:  [14-26] 18 (06/01 0600) BP: (78-125)/(48-103) 118/57 mmHg (06/01 0400) SpO2:  [90 %-99 %] 96 % (06/01 0600) Weight:  [74.5 kg (164 lb 3.9 oz)] 74.5 kg (164 lb 3.9 oz) (06/01 0500) Weight change: 0 kg (0 lb) Last BM Date: 03/19/16  Intake/Output from previous day: 05/31 0701 - 06/01 0700 In: 830 [P.O.:720] Out: 2150 [Urine:2150]  PHYSICAL EXAM General appearance: alert, cooperative and no distress Resp: diminished breath sounds bilaterally Cardio: irregularly irregular rhythm GI: soft, non-tender; bowel sounds normal; no masses,  no organomegaly Extremities: extremities normal, atraumatic, no cyanosis or edema  Lab Results:  Results for orders placed or performed during the hospital encounter of 03/09/16 (from the past 48 hour(s))  Glucose, capillary     Status: Abnormal   Collection Time: 03/18/16  7:57 AM  Result Value Ref Range   Glucose-Capillary 195 (H) 65 - 99 mg/dL   Comment 1 Notify RN   Glucose, capillary     Status: Abnormal   Collection Time: 03/18/16 12:20 PM  Result Value Ref Range   Glucose-Capillary 144 (H) 65 - 99 mg/dL  Blood gas, arterial     Status: Abnormal   Collection Time: 03/18/16  2:47 PM  Result Value Ref Range   FIO2 0.45    O2 Content 45.0 L/min   Delivery systems BILEVEL POSITIVE AIRWAY PRESSURE    Inspiratory PAP 20.0    Expiratory PAP 5.0    pH, Arterial 7.383 7.350 - 7.450   pCO2 arterial 88.3 (HH) 35.0 - 45.0 mmHg    Comment: CRITICAL RESULT CALLED TO, READ BACK BY AND  VERIFIED WITH: LAWRENCE SHELTON,RN ON 03/18/2016 BY PEVIANY LAWSON,RRT [id number].    pO2, Arterial 69.3 (L) 80.0 - 100.0 mmHg   Bicarbonate 47.2 (H) 20.0 - 24.0 mEq/L   Acid-Base Excess 24.6 (H) 0.0 - 2.0 mmol/L   O2 Saturation 93.1 %   Collection site RIGHT RADIAL    Drawn by [phone]    Sample type ARTERIAL    Allens test (pass/fail) PASS PASS  Glucose, capillary     Status: Abnormal   Collection Time: 03/18/16  4:05 PM  Result Value Ref Range   Glucose-Capillary 117 (H) 65 - 99 mg/dL   Comment 1 Notify RN   Glucose, capillary     Status: Abnormal   Collection Time: 03/18/16  9:45 PM  Result Value Ref Range   Glucose-Capillary 230 (H) 65 - 99 mg/dL  Blood gas, arterial     Status: Abnormal   Collection Time: 03/19/16  3:52 AM  Result Value Ref Range   FIO2 0.35    Delivery systems BILEVEL POSITIVE AIRWAY PRESSURE    LHR 14 resp/min   Peep/cpap 5.0 cm H20   Inspiratory PAP 20    Expiratory PAP 5    pH, Arterial 7.361 7.350 - 7.450   pCO2 arterial 88.5 (HH) 35.0 - 45.0 mmHg    Comment: CRITICAL RESULT CALLED TO, READ BACK BY AND VERIFIED WITH: CINDY PHILLIPS RN AT 0410 BY  JAMIE SILVA RRT ON 5 CRITICAL RESULT CALLED TO, READ BACK BY AND VERIFIED WITH: CINDY PHILLIPS RN AT 0410 BY JAMIE SILVA RRT ON 03/19/16    pO2, Arterial 83.6 80.0 - 100.0 mmHg   Bicarbonate 44.4 (H) 20.0 - 24.0 mEq/L   TCO2 13.4 0 - 100 mmol/L   Acid-Base Excess 22.0 (H) 0.0 - 2.0 mmol/L   O2 Saturation 96.0 %   Collection site RIGHT RADIAL    Drawn by [id number]    Sample type ARTERIAL DRAW    Allens test (pass/fail) PASS PASS  Basic metabolic panel     Status: Abnormal   Collection Time: 03/19/16  5:19 AM  Result Value Ref Range   Sodium 138 135 - 145 mmol/L   Potassium 4.0 3.5 - 5.1 mmol/L   Chloride 82 (L) 101 - 111 mmol/L   CO2 48 (H) 22 - 32 mmol/L   Glucose, Bld 214 (H) 65 - 99 mg/dL   BUN 54 (H) 6 - 20 mg/dL   Creatinine, Ser 0.97 0.44 - 1.00 mg/dL   Calcium 8.4 (L) 8.9 - 10.3 mg/dL   GFR calc  non Af Amer 53 (L) >60 mL/min   GFR calc Af Amer >60 >60 mL/min    Comment: (NOTE) The eGFR has been calculated using the CKD EPI equation. This calculation has not been validated in all clinical situations. eGFR's persistently <60 mL/min signify possible Chronic Kidney Disease.    Anion gap 8 5 - 15  CBC     Status: Abnormal   Collection Time: 03/19/16  5:19 AM  Result Value Ref Range   WBC 5.7 4.0 - 10.5 K/uL   RBC 3.32 (L) 3.87 - 5.11 MIL/uL   Hemoglobin 9.9 (L) 12.0 - 15.0 g/dL   HCT 34.0 (L) 36.0 - 46.0 %   MCV 102.4 (H) 78.0 - 100.0 fL   MCH 29.8 26.0 - 34.0 pg   MCHC 29.1 (L) 30.0 - 36.0 g/dL   RDW 14.0 11.5 - 15.5 %   Platelets 165 150 - 400 K/uL  Glucose, capillary     Status: Abnormal   Collection Time: 03/19/16  7:23 AM  Result Value Ref Range   Glucose-Capillary 195 (H) 65 - 99 mg/dL   Comment 1 Notify RN    Comment 2 Document in Chart   Glucose, capillary     Status: Abnormal   Collection Time: 03/19/16 11:21 AM  Result Value Ref Range   Glucose-Capillary 271 (H) 65 - 99 mg/dL  Glucose, capillary     Status: Abnormal   Collection Time: 03/19/16  4:28 PM  Result Value Ref Range   Glucose-Capillary 201 (H) 65 - 99 mg/dL   Comment 1 Notify RN    Comment 2 Document in Chart   Blood gas, arterial     Status: Abnormal   Collection Time: 03/20/16  4:40 AM  Result Value Ref Range   FIO2 35.00    Delivery systems BILEVEL POSITIVE AIRWAY PRESSURE    Inspiratory PAP 20.0    Expiratory PAP 5.0    pH, Arterial 7.455 (H) 7.350 - 7.450   pCO2 arterial 63.7 (HH) 35.0 - 45.0 mmHg    Comment: CRITICAL RESULT CALLED TO, READ BACK BY AND VERIFIED WITH: WAGONER,R RN AT 0518 03/20/16 PITTMAN,S.RRT/RCP    pO2, Arterial 64.7 (L) 80.0 - 100.0 mmHg   Bicarbonate 41.7 (H) 20.0 - 24.0 mEq/L   TCO2 12.2 0 - 100 mmol/L   Acid-Base Excess 18.7 (H) 0.0 - 2.0 mmol/L     O2 Saturation 93.4 %   Patient temperature 37.0    Collection site RIGHT RADIAL    Drawn by [id number]    Sample type  ARTERIAL    Allens test (pass/fail) PASS PASS  Basic metabolic panel     Status: Abnormal   Collection Time: 03/20/16  4:54 AM  Result Value Ref Range   Sodium 135 135 - 145 mmol/L   Potassium 3.8 3.5 - 5.1 mmol/L   Chloride 86 (L) 101 - 111 mmol/L   CO2 42 (H) 22 - 32 mmol/L   Glucose, Bld 210 (H) 65 - 99 mg/dL   BUN 48 (H) 6 - 20 mg/dL   Creatinine, Ser 0.67 0.44 - 1.00 mg/dL   Calcium 8.4 (L) 8.9 - 10.3 mg/dL   GFR calc non Af Amer >60 >60 mL/min   GFR calc Af Amer >60 >60 mL/min    Comment: (NOTE) The eGFR has been calculated using the CKD EPI equation. This calculation has not been validated in all clinical situations. eGFR's persistently <60 mL/min signify possible Chronic Kidney Disease.    Anion gap 7 5 - 15  CBC     Status: Abnormal   Collection Time: 03/20/16  4:54 AM  Result Value Ref Range   WBC 5.4 4.0 - 10.5 K/uL   RBC 3.25 (L) 3.87 - 5.11 MIL/uL   Hemoglobin 9.8 (L) 12.0 - 15.0 g/dL   HCT 31.8 (L) 36.0 - 46.0 %   MCV 97.8 78.0 - 100.0 fL   MCH 30.2 26.0 - 34.0 pg   MCHC 30.8 30.0 - 36.0 g/dL   RDW 13.9 11.5 - 15.5 %   Platelets 182 150 - 400 K/uL    ABGS  Recent Labs  03/20/16 0440  PHART 7.455*  PO2ART 64.7*  TCO2 12.2  HCO3 41.7*   CULTURES Recent Results (from the past 240 hour(s))  Culture, body fluid-bottle     Status: None   Collection Time: 03/10/16  1:00 PM  Result Value Ref Range Status   Specimen Description FLUID LEFT PLEURAL COLLECTED BY DOCTOR BOLES  Final   Special Requests BOTTLES DRAWN AEROBIC AND ANAEROBIC 10CC EACH  Final   Culture NO GROWTH 5 DAYS  Final   Report Status 03/15/2016 FINAL  Final  Gram stain     Status: None   Collection Time: 03/10/16  1:00 PM  Result Value Ref Range Status   Specimen Description FLUID LEFT PLEURAL COLLECTED BY DOCTOR BOLES  Final   Special Requests NONE  Final   Gram Stain   Final    CYTOSPIN SMEAR WBC PRESENT, PREDOMINANTLY MONONUCLEAR NO ORGANISMS SEEN Performed at [facility]     Report Status 03/10/2016 FINAL  Final  MRSA PCR Screening     Status: None   Collection Time: 03/11/16 11:42 PM  Result Value Ref Range Status   MRSA by PCR NEGATIVE NEGATIVE Final    Comment:        The GeneXpert MRSA Assay (FDA approved for NASAL specimens only), is one component of a comprehensive MRSA colonization surveillance program. It is not intended to diagnose MRSA infection nor to guide or monitor treatment for MRSA infections.    Studies/Results: Dg Chest Port 1 View  03/20/2016  CLINICAL DATA:  Respiratory failure. EXAM: PORTABLE CHEST 1 VIEW COMPARISON:  03/18/2016. FINDINGS: Mediastinum hilar structures are unremarkable. Cardiomegaly with progressive bilateral pulmonary interstitial prominence suggesting congestive heart failure. Persistent low lung volumes with left lower lobe atelectasis and/or consolidation. Bilateral pleural effusions   left side greater than right again noted. No pneumothorax. IMPRESSION: 1. Cardiomegaly with progressive diffuse bilateral from interstitial prominence consistent with congestive heart failure. Bilateral pleural effusions again noted, left side greater than right. 2.  Left lower lobe atelectasis and/or consolidation. Electronically Signed   By: Thomas  Register   On: 03/20/2016 07:05    Medications:  Prior to Admission:  Prescriptions prior to admission  Medication Sig Dispense Refill Last Dose  . acetaminophen (TYLENOL) 500 MG tablet Take 1,000 mg by mouth every 6 (six) hours as needed for mild pain.   03/08/2016  . aspirin EC 81 MG tablet Take 81 mg by mouth daily.   03/09/2016  . diltiazem (CARDIZEM CD) 180 MG 24 hr capsule Take 1 capsule (180 mg total) by mouth daily. 90 capsule 3 03/09/2016  . furosemide (LASIX) 40 MG tablet Take 40 mg by mouth daily.     03/09/2016  . ipratropium-albuterol (DUONEB) 0.5-2.5 (3) MG/3ML SOLN Take 3 mLs by nebulization as needed.     Past Month  . LORazepam (ATIVAN) 1 MG tablet Take 0.5 mg by mouth  daily.    03/09/2016  . omeprazole (PRILOSEC) 20 MG capsule Take 20 mg by mouth at bedtime.    03/08/2016  . atorvastatin (LIPITOR) 10 MG tablet Take 10 mg by mouth daily. Reported on 12/04/2015   Taking  . nitroGLYCERIN (NITROSTAT) 0.4 MG SL tablet Place 1 tablet (0.4 mg total) under the tongue every 5 (five) minutes as needed for chest pain. 25 tablet 3 Never  . NON FORMULARY Pt on 2.5 liters of O2 daily   Taking   Scheduled: . antiseptic oral rinse  7 mL Mouth Rinse BID  . antiseptic oral rinse  7 mL Mouth Rinse q12n4p  . apixaban  5 mg Oral BID  . carvedilol  12.5 mg Oral BID WC  . chlorhexidine  15 mL Mouth Rinse BID  . digoxin  0.125 mg Oral Daily  . insulin aspart  0-20 Units Subcutaneous TID WC  . insulin glargine  7 Units Subcutaneous Daily  . ipratropium-albuterol  3 mL Nebulization TID  . LORazepam  0.5 mg Oral Daily  . methylPREDNISolone (SOLU-MEDROL) injection  60 mg Intravenous Q8H  . pantoprazole  40 mg Oral Daily  . sodium chloride flush  3 mL Intravenous Q12H   Continuous:  PRN:acetaminophen **OR** [DISCONTINUED] acetaminophen, haloperidol lactate, ipratropium-albuterol, labetalol, nitroGLYCERIN, [DISCONTINUED] ondansetron **OR** ondansetron (ZOFRAN) IV, polyethylene glycol  Assesment: She has acute on chronic hypoxic and hypercapnic respiratory failure. She has been on BiPAP for several nights and has improved markedly. She had a large left pleural effusion and had thoracentesis and is improved. She has chronic atrial fibrillation and when she was admitted she had RVR but not now. She had acute encephalopathy that I think was related to her gas abnormalities and is much improved. She has COPD exacerbation and that is much improved. Principal Problem:   Acute on chronic respiratory failure with hypoxia (HCC) Active Problems:   Type 2 diabetes mellitus (HCC)   COPD (chronic obstructive pulmonary disease) (HCC)   Pleural effusion on left   Atrial fibrillation (HCC)    Status post thoracentesis   Pleural effusion, bilateral   Atrial fibrillation with RVR (HCC)   Confusion   SOB (shortness of breath)   Mental confusion   Palliative care encounter   Goals of care, counseling/discussion    Plan: I think she is approaching baseline. Plans are that she would go to skilled care facility for   rehabilitation and I think that is still an appropriate plan. I think she will need BiPAP.    LOS: 11 days   [name] L 03/20/2016, 7:15 AM

## 2016-03-20 NOTE — Progress Notes (Signed)
Patient ID: [name], female   DOB: 08/20/1931, 82 y.o.   MRN: [id number]      [facility] Room: 126  Chief Complaint  Patient presents with  . New Admit To SNF    [facility]    Allergies  Allergen Reactions  . Codeine Nausea Only  . Heparin Other (See Comments)    Causes internal bleeding      This is a comprehensive admission note to [facility] personally performed by Marga MelnickWilliam Hopper MD on this date less than 30 days from date of admission. Included are preadmission medical/surgical history;reconciled medication list; family history; social history and comprehensive review of systems.  Corrections and additions to the records were documented . Comprehensive physical exam was also performed. Additionally a clinical summary was entered for each active diagnosis pertinent to this admission in the Problem List to enhance continuity of care.  PCP: Dr. Dwana MelenaZack Hall  HPI:The patient was hospitalized 5/21-03/20/16 with acute on chronic respiratory failure with hypoxia and hypercarbic failure necessitating BiPAP 5/23 in the context of COPD and decompensated congestive heart failure.. Ejection fraction was 40-45 percent with wall motion abnormality. She had  associated pleural effusion bilaterally. Thoracentesis was performed on the left 5/22 with removal of 1 L transudative fluid. With aggressive diuresis she had dramatic (- 11 L) I&O volume change. The hypoxia was associated with mental status changes of confusion. With clinical suspicion of possible aspiration pneumonia she received antibiotics. Started on  Eliquis for the atrial fibrillation  With RVR.  She has a history of diet-controlled diabetes. During hospitalization she was on IV steroids. Glucoses ranged from 190-263. She received sliding scale insulin. At time of discharge she was placed on a prednisone taper for component of COPD exacerbation. She was seen by palliative care while  hospitalized.  Past medical and surgical history: She has diagnoses of hypertension, asthma, coronary disease, dyslipidemia, and history of GI bleed. 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 He states that her fasting blood sugars at home prior to admission were in the range of 105. She describes some residual edema. Otherwise she has no active symptoms: Constitutional: No fever,significant weight change, fatigue  Eyes: No redness, discharge, pain, vision change ENT/mouth: No nasal congestion,  purulent discharge, earache,change in hearing ,sore throat  Cardiovascular: No chest pain, palpitations,paroxysmal nocturnal dyspnea, claudication  Respiratory: No cough, sputum production,hemoptysis, DOE , significant snoring,apnea   Gastrointestinal: No heartburn,dysphagia,abdominal pain, nausea / vomiting,rectal bleeding, melena,change in bowels Genitourinary: No dysuria,hematuria, pyuria,  incontinence, nocturia Musculoskeletal: No joint stiffness, joint swelling, weakness,pain Dermatologic: No rash, pruritus, change in appearance of skin Neurologic: No dizziness,headache,syncope, seizures, numbness , tingling Psychiatric: No significant anxiety , depression, insomnia, anorexia Endocrine: No change in hair/skin/ nails, excessive thirst, excessive hunger, excessive urination  Hematologic/lymphatic: No significant bruising, lymphadenopathy,abnormal bleeding Allergy/immunology: No itchy/ watery eyes, significant sneezing, urticaria, angioedema  Physical exam:  Pertinent or positive findings: She appears fatigued & somewhat chronically ill. She does exhibit decreased hearing bilaterally. She has complete dentures. Arcus senilis is present. Heart irregular and heart sounds are distant. Rales are noted at the left lower  lobe. Breath sounds are decreased at the right base. Breath sounds are overall decreased diffusely. Posterior tibial pulses are decreased. She has trace edema at the sock line. She has many small circular slightly crusty,  slightly hyperpigmented lesions over the lower extremities and forearms suggesting possible lichen planus.By history this has not been evaluated. Affect is flat. She gave the date as May 31 but was otherwise oriented to date and current events. She has a large fleshy polyp at the superior buttocks area. She states she may have a small ulcer around the rectal area. Wound Nurse will be consulted.  General appearance:Adequately nourished; no acute distress , increased work of breathing is present.   Lymphatic: No lymphadenopathy about the head, neck, axilla . Eyes: No conjunctival inflammation or lid edema is present. There is no scleral icterus. Ears:  External ear exam shows no significant lesions or deformities.   Nose:  External nasal examination shows no deformity or inflammation. Nasal mucosa are pink and moist without lesions ,exudates Oral exam: lips and gums are healthy appearing.There is no oropharyngeal erythema or exudate . Neck:  No thyromegaly, masses, tenderness noted.    Heart:  No gallop, murmur, click, rub .  Abdomen:Bowel sounds are normal. Abdomen is soft and nontender with no organomegaly, hernias,masses. GU: deferred  Extremities:  No cyanosis; slight clubbing Neurologic exam : Strength decreased  in upper & lower extremities Balance,Rhomberg,finger to nose testing could not be completed due to clinical state Skin: Warm & dry w/o tenting.  See clinical summary under each active problem in the Problem List with associated updated therapeutic plan

## 2016-03-21 LAB — HEMOGLOBIN A1C
Hgb A1c MFr Bld: 6.4 %
MEAN PLASMA GLUCOSE: 137

## 2016-03-24 ENCOUNTER — Ambulatory Visit (INDEPENDENT_AMBULATORY_CARE_PROVIDER_SITE_OTHER): Payer: Medicare Other | Admitting: Adult Health

## 2016-03-24 ENCOUNTER — Encounter: Payer: Self-pay | Admitting: Adult Health

## 2016-03-24 VITALS — BP 128/58 | HR 88 | Ht 64.0 in | Wt 166.0 lb

## 2016-03-24 DIAGNOSIS — I480 Paroxysmal atrial fibrillation: Secondary | ICD-10-CM | POA: Diagnosis not present

## 2016-03-24 DIAGNOSIS — I5042 Chronic combined systolic (congestive) and diastolic (congestive) heart failure: Secondary | ICD-10-CM

## 2016-03-24 DIAGNOSIS — D509 Iron deficiency anemia, unspecified: Secondary | ICD-10-CM | POA: Diagnosis not present

## 2016-03-24 DIAGNOSIS — I6522 Occlusion and stenosis of left carotid artery: Secondary | ICD-10-CM

## 2016-03-24 NOTE — Progress Notes (Deleted)
Name: [name]    DOB: Mar 12, 1931  Age: 82 y.o.  MR#: [id number]       PCP:  [name], MD      Insurance: Payor: MEDICARE / Plan: MEDICARE PART A AND B / Product Type: *No Product type* /   CC:   No chief complaint on file.   VS Filed Vitals:   03/24/16 1501  BP: 128/58  Pulse: 88  Height: 5\' 4"  (1.626 m)  Weight: 166 lb (75.297 kg)  SpO2: 97%    Weights Current Weight  03/24/16 166 lb (75.297 kg)  03/20/16 164 lb (74.39 kg)  03/20/16 164 lb 3.9 oz (74.5 kg)    Blood Pressure  BP Readings from Last 3 Encounters:  03/24/16 128/58  03/20/16 113/66  03/20/16 113/42     Admit date:  (Not on file) Last encounter with RMR:  Visit date not found   Allergy Codeine and Heparin  No current outpatient prescriptions on file.   No current facility-administered medications for this visit.    Discontinued Meds:    Medications Discontinued During This Encounter  Medication Reason  . furosemide (LASIX) 20 MG tablet Error    Patient Active Problem List   Diagnosis Date Noted  . Combined congestive systolic and diastolic heart failure (HCC) 03/20/2016  . Palliative care encounter   . Goals of care, counseling/discussion   . Atrial fibrillation with RVR (HCC)   . Acute on chronic respiratory failure with hypoxia (HCC) 03/09/2016  . Atrial fibrillation (HCC) 03/09/2016  . Hyperlipidemia   . Type 2 diabetes mellitus (HCC) 12/26/2009  . HYPERLIPIDEMIA 12/26/2009  . Essential hypertension, benign 12/26/2009  . Coronary atherosclerosis of native coronary artery 12/26/2009  . COPD (chronic obstructive pulmonary disease) (HCC) 12/26/2009    LABS    Component Value Date/Time   NA 135 03/20/2016 0454   NA 135* 03/20/2016   NA 138 03/19/2016 0519   NA 138 03/19/2016   NA 141 03/19/2016   NA 141 03/18/2016 0441   K 3.8 03/20/2016 0454   K 4.0 03/19/2016 0519   K 4.0 03/19/2016   K 4.3 03/19/2016   CL 86* 03/20/2016 0454   CL 82* 03/19/2016 0519   CL 80*  03/18/2016 0441   CO2 42* 03/20/2016 0454   CO2 48* 03/19/2016 0519   CO2 50* 03/18/2016 0441   GLUCOSE 210* 03/20/2016 0454   GLUCOSE 214* 03/19/2016 0519   GLUCOSE 192* 03/18/2016 0441   BUN 48* 03/20/2016 0454   BUN 48* 03/20/2016   BUN 54* 03/19/2016 0519   BUN 54* 03/19/2016   BUN 56* 03/19/2016   BUN 56* 03/18/2016 0441   CREATININE 0.67 03/20/2016 0454   CREATININE 0.7 03/20/2016   CREATININE 0.97 03/19/2016 0519   CREATININE 1.0 03/19/2016   CREATININE 1.1 03/19/2016   CREATININE 1.13* 03/18/2016 0441   CREATININE 0.68 02/27/2016 1220   CALCIUM 8.4* 03/20/2016 0454   CALCIUM 8.4* 03/19/2016 0519   CALCIUM 8.1* 03/18/2016 0441   GFRNONAA >60 03/20/2016 0454   GFRNONAA 53* 03/19/2016 0519   GFRNONAA 44* 03/18/2016 0441   GFRAA >60 03/20/2016 0454   GFRAA >60 03/19/2016 0519   GFRAA 51* 03/18/2016 0441   CMP     Component Value Date/Time   NA 135 03/20/2016 0454   NA 135* 03/20/2016   K 3.8 03/20/2016 0454   CL 86* 03/20/2016 0454   CO2 42* 03/20/2016 0454   GLUCOSE 210* 03/20/2016 0454   BUN 48* 03/20/2016 0454  BUN 48* 03/20/2016   CREATININE 0.67 03/20/2016 0454   CREATININE 0.7 03/20/2016   CREATININE 0.68 02/27/2016 1220   CALCIUM 8.4* 03/20/2016 0454   PROT 5.8* 03/10/2016 0625   ALBUMIN 3.4* 03/10/2016 0625   AST 16 03/10/2016 0625   ALT 16 03/10/2016 0625   ALKPHOS 42 03/10/2016 0625   BILITOT 0.7 03/10/2016 0625   GFRNONAA >60 03/20/2016 0454   GFRAA >60 03/20/2016 0454       Component Value Date/Time   WBC 5.4 03/20/2016 0454   WBC 5.7 03/19/2016 0519   WBC 7.1 03/18/2016 0441   WBC 4.1 03/15/2016   HGB 9.8* 03/20/2016 0454   HGB 9.9* 03/19/2016 0519   HGB 11.1* 03/18/2016 0441   HCT 31.8* 03/20/2016 0454   HCT 34.0* 03/19/2016 0519   HCT 38.1 03/18/2016 0441   MCV 97.8 03/20/2016 0454   MCV 102.4* 03/19/2016 0519   MCV 102.4* 03/18/2016 0441    Lipid Panel  No results found for: CHOL, TRIG, HDL, CHOLHDL, VLDL, LDLCALC,  LDLDIRECT  ABG    Component Value Date/Time   PHART 7.455* 03/20/2016 0440   PCO2ART 63.7* 03/20/2016 0440   PO2ART 64.7* 03/20/2016 0440   HCO3 41.7* 03/20/2016 0440   TCO2 12.2 03/20/2016 0440   ACIDBASEDEF PENDING 03/18/2016 0445   O2SAT 93.4 03/20/2016 0440     No results found for: TSH BNP (last 3 results)  Recent Labs  03/09/16 1640 03/10/16 0625  BNP 435.0* 393.0*    ProBNP (last 3 results) No results for input(s): PROBNP in the last 8760 hours.  Cardiac Panel (last 3 results) No results for input(s): CKTOTAL, CKMB, TROPONINI, RELINDX in the last 72 hours.  Iron/TIBC/Ferritin/ %Sat No results found for: IRON, TIBC, FERRITIN, IRONPCTSAT   EKG Orders placed or performed during the hospital encounter of 03/09/16  . EKG 12-Lead  . EKG 12-Lead  . EKG     Prior Assessment and Plan Problem List as of 03/24/2016      Cardiovascular and Mediastinum   Essential hypertension, benign   Last Assessment & Plan 04/24/2014 Office Visit Written 04/24/2014 11:34 AM by Jonelle SidleSamuel G McDowell, MD    Keep followup with Dr. Margo AyeHall, no change made to current regimen.      Coronary atherosclerosis of native coronary artery   Last Assessment & Plan 04/24/2014 Office Visit Written 04/24/2014 11:33 AM by Jonelle SidleSamuel G McDowell, MD    Symptomatically stable with remote interventions to the RCA as outlined above. Continue current regimen, give prescription for as needed sublingual nitroglycerin. Continue annual followup pattern unless symptoms escalate.      Atrial fibrillation [facility](HCC)   Last Assessment & Plan 03/20/2016 Nursing Home Written 03/20/2016  3:36 PM by Pecola LawlessWilliam F Hopper, MD    Continue Eliquis but monitor for any bleeding because of past medical history of GI bleed      Atrial fibrillation with RVR (HCC)   Combined congestive systolic and diastolic heart failure (HCC)     Respiratory   COPD (chronic obstructive pulmonary disease) [facility](HCC)   Last Assessment & Plan 04/24/2014 Office Visit Written  04/24/2014 11:34 AM by Jonelle SidleSamuel G McDowell, MD    On chronic oxygen.      Acute on chronic respiratory failure with hypoxia [facility](HCC)   Last Assessment & Plan 03/20/2016 Nursing Home Written 03/20/2016  3:34 PM by Pecola LawlessWilliam F Hopper, MD    BiPAP daily at bedtime with IPAP of 20 and EPAP of 5; Tidal volume 600 cc; Rate 16  Endocrine   Type 2 diabetes mellitus [facility])   Last Assessment & Plan 03/20/2016 Nursing Home Edited 03/20/2016  3:38 PM by Pecola Lawless, MD    The hyperglycemia should resolve as the prednisone is weaned. Monitor fasting blood sugars; if they stay over 200 low-dose basal insulin (Lantus) will be initiated at a dose of 10 mg at bedtime A1c was prediabetic 5/29        Other   HYPERLIPIDEMIA   Last Assessment & Plan 04/24/2014 Office Visit Written 04/24/2014 11:33 AM by Jonelle Sidle, MD    Continues on Lipitor, lipids followed by Dr. Margo Aye.      Hyperlipidemia   Palliative care encounter   Goals of care, counseling/discussion       Imaging: Dg Chest 1 View  03/10/2016  CLINICAL DATA:  LEFT pleural effusion post thoracentesis EXAM: CHEST 1 VIEW COMPARISON:  03/09/2016 FINDINGS: Expiratory technique. Significant decrease in LEFT pleural effusion post thoracentesis. No pneumothorax. Persistent small to moderate LEFT pleural effusion and basilar atelectasis identified. Small RIGHT pleural effusion also noted. Patient rotated to the LEFT. Dense atherosclerotic calcification and aorta. Question enlargement of cardiac silhouette. Diffuse osseous demineralization. IMPRESSION: Significant decrease in LEFT pleural effusion post thoracentesis. No pneumothorax. Electronically Signed   By: Ulyses Southward M.D.   On: 03/10/2016 13:57   Dg Chest 2 View  03/09/2016  CLINICAL DATA:  Shortness of breath. Weakness. Atrial fibrillation. EXAM: CHEST  2 VIEW COMPARISON:  02/11/2016 FINDINGS: The large left and moderate right pleural effusion. Only about 25% of the left lung is aerated. Left heart  border obscured.  Suspected underlying cardiomegaly. Atherosclerotic aortic arch. Thoracic spondylosis. Indistinct pulmonary vasculature. Bony demineralization. IMPRESSION: 1. Large left and moderate right pleural effusions. Only about 25% of the left lung is aerated. 2. Atherosclerosis. 3. Thoracic spondylosis. 4. Indistinct pulmonary vasculature could reflect pulmonary venous hypertension. Electronically Signed   By: Gaylyn Rong M.D.   On: 03/09/2016 17:29   Dg Chest Port 1 View  03/20/2016  CLINICAL DATA:  Respiratory failure. EXAM: PORTABLE CHEST 1 VIEW COMPARISON:  03/18/2016. FINDINGS: Mediastinum hilar structures are unremarkable. Cardiomegaly with progressive bilateral pulmonary interstitial prominence suggesting congestive heart failure. Persistent low lung volumes with left lower lobe atelectasis and/or consolidation. Bilateral pleural effusions left side greater than right again noted. No pneumothorax. IMPRESSION: 1. Cardiomegaly with progressive diffuse bilateral from interstitial prominence consistent with congestive heart failure. Bilateral pleural effusions again noted, left side greater than right. 2.  Left lower lobe atelectasis and/or consolidation. Electronically Signed   By: Maisie Fus  Register   On: 03/20/2016 07:05   Dg Chest Port 1 View  03/18/2016  CLINICAL DATA:  Difficulty breathing EXAM: PORTABLE CHEST 1 VIEW COMPARISON:  03/15/2016 FINDINGS: Cardiac shadow is stable. Bilateral pleural effusions are again seen and stable. Left basilar consolidation is again noted. Aortic calcifications are again seen. No acute bony abnormality is noted. IMPRESSION: Bilateral pleural effusions left greater than right. Left basilar consolidation is noted. Electronically Signed   By: Alcide Clever M.D.   On: 03/18/2016 07:12   Dg Chest Port 1 View  03/15/2016  CLINICAL DATA:  82 year old female with history of congestive heart failure. Weakness and fatigue. EXAM: PORTABLE CHEST 1 VIEW COMPARISON:   Chest x-ray 03/12/2016. FINDINGS: Moderate to large left pleural effusion. Probable passive atelectasis throughout the base of the left lung. Small to moderate right pleural effusion. Probable passive subsegmental atelectasis in the right lower lobe. Mild pulmonary venous congestion, without frank pulmonary edema. Cardiomegaly.  The patient is rotated to the left on today's exam, resulting in distortion of the mediastinal contours and reduced diagnostic sensitivity and specificity for mediastinal pathology. Atherosclerosis in the thoracic aorta. IMPRESSION: 1. Cardiomegaly with pulmonary venous congestion, but no frank pulmonary edema. 2. Moderate to large left and small to moderate right pleural effusions with persistent bibasilar atelectasis, as above. 3. Atherosclerosis. Electronically Signed   By: Trudie Reed M.D.   On: 03/15/2016 07:48   Dg Chest Port 1 View  03/12/2016  CLINICAL DATA:  Respiratory failure EXAM: PORTABLE CHEST 1 VIEW COMPARISON:  Chest radiograph from one day prior. FINDINGS: Stable cardiomediastinal silhouette with mild cardiomegaly. No pneumothorax. Stable moderate left pleural effusion. Stable small right pleural effusion. Mild pulmonary edema is not appreciably changed. Patchy left greater than right bilateral lung opacities appear stable. IMPRESSION: Stable chest radiograph with mild congestive heart failure, moderate left and small right pleural effusions and left greater than right bibasilar lung opacities favoring atelectasis. Electronically Signed   By: Delbert Phenix M.D.   On: 03/12/2016 10:20   Dg Chest Port 1 View  03/11/2016  CLINICAL DATA:  Shortness of breath and weakness EXAM: PORTABLE CHEST 1 VIEW COMPARISON:  03/10/2016 FINDINGS: Cardiac shadow is stable. Some slight increase in the degree of left pleural effusion is noted from the prior exam. Increased density is also noted in the right base likely related to early infiltrate. No pneumothorax is seen. No bony  abnormality is noted. IMPRESSION: Slight increase in left pleural effusion with new right basilar infiltrate. Electronically Signed   By: Alcide Clever M.D.   On: 03/11/2016 20:07   US Thoracentesis Asp Pleural Space W/img Guide  03/10/2016  INDICATION: LEFT pleural effusion EXAM: ULTRASOUND GUIDED DIAGNOSTIC AND THERAPEUTIC LEFT THORACENTESIS MEDICATIONS: None. COMPLICATIONS: None immediate. PROCEDURE: Procedure, benefits, and risks of procedure were discussed with patient. Written informed consent for procedure was obtained. Time out protocol followed. Pleural effusion localized by ultrasound at the posterior LEFT hemithorax. Skin prepped and draped in usual sterile fashion. Skin and soft tissues anesthetized with 10 mL of 1% lidocaine. 8 French thoracentesis catheter placed into the 1000 mL pleural space. 1000 mL of amber color fluid was aspirated by syringe pump. Procedure tolerated well by patient without immediate complication. FINDINGS: A total of approximately 1000 mL of LEFT pleural fluid was removed. 180 mL of fluid was sent to the laboratory as requested by the clinical team for analysis. IMPRESSION: Successful ultrasound guided LEFT thoracentesis yielding 1000 mL of pleural fluid. Electronically Signed   By: Ulyses Southward M.D.   On: 03/10/2016 14:08

## 2016-03-24 NOTE — Patient Instructions (Signed)
Your physician recommends that you schedule a follow-up appointment in: 3 Months with Dr. Diona BrownerMcDowell  Your physician recommends that you continue on your current medications as directed. Please refer to the Current Medication list given to you today.  .Your physician recommends that you return for lab work in: Anemia panel   Your physician recommends that you schedule a follow-up appointment with Dr. Margo AyeHall   If you need a refill on your cardiac medications before your next appointment, please call your pharmacy.  Thank you for choosing  [facility]!     .Marland Kitchen

## 2016-03-24 NOTE — Progress Notes (Signed)
Cardiology Office Note   Date:  03/24/2016   ID:  [name], DOB September 08, 1931, MRN [id number]  PCP:  Kathy Melena, MD  Cardiologist: [name]/  Kathy Reining, NP   Chief Complaint  Patient presents with  . Hypertension  . Coronary Artery Disease      History of Present Illness: [name] is a 82 y.o. female who presents for ongoing assessment and management of coronary artery disease,atrial fibrillation,hypertension, hyperlipidemia, with other history to include diabetes and COPD. The patient was last seen by Dr. Diona Browner on 02/27/2016.  Which time Dr. Diona Browner spoke with the patient about management strategies for atrial fibrillation.the patient was increased on Cardizem CD 280 mg daily. A CBC and a BMET were obtained to consider trying eloquence instead of aspirin.  Labs are completed on 03/20/2016, sodium 135, potassium 3.8, chloride 86, CO2 42, BUN 40, creatinine 0.67.CBC revealed anemia with a hemoglobin of 9.8, hematocrit 31.8, white blood cells 5.4, platelets 182.  Today she is complaining of being exhausted, breathing has been unchanged which remains on oxygen. The patient has just had physical therapy for the first time today. She denies any chest pain but she has generalized fatigue.  Past Medical History  Diagnosis Date  . Essential hypertension, benign   . Hyperlipidemia   . History of GI bleed   . COPD (chronic obstructive pulmonary disease) (HCC)     Oxygen dependent  . Old inferior wall myocardial infarction 1999    [facility]  . Asthma   . Type 2 diabetes mellitus (HCC)   . Coronary atherosclerosis of native coronary artery     Past Surgical History  Procedure Laterality Date  . Cesareen section    . Cholecystectomy    . Exploratory laparotomy    . Partial colectomy    . Thoracentesis Left 03/10/16    1 L transudative fluid  . Coronary artery stent  1999    BMS x 3 RCA  ([facility]), [facility] RC4     No current outpatient prescriptions on file.   No  current facility-administered medications for this visit.    Allergies:   Codeine and Heparin    Social History:  The patient  reports that she quit smoking about 18 years ago. Her smoking use included Cigarettes. She has a 80 pack-year smoking history. She has never used smokeless tobacco. She reports that she does not drink alcohol or use illicit drugs.   Family History:  The patient's family history includes Cancer in her brother and sister; Diabetes in her brother, father, and mother; Heart disease in her father.    ROS: All other systems are reviewed and negative. Unless otherwise mentioned in H&P    PHYSICAL EXAM: VS:  BP 128/58 mmHg  Pulse 88  Ht  (1.626 m)  Wt 166 lb (75.297 kg)  BMI 28.48 kg/m2  SpO2 97% , BMI Body mass index is 28.48 kg/(m^2). GEN: Well nourished, well developed, in no acute distress HEENT: normal Neck: no JVD, carotid bruits, or masses Cardiac: RRR, occasional irregularity; no murmurs, rubs, or gallops,no edema  Respiratory: clear to auscultation bilaterally, normal work of breathing. Wearing oxygen via nasal cannula. GI: soft, nontender, nondistended, + BS MS: no deformity or atrophy Skin: warm and dry, no rash Neuro:  Strength is diminished,and sensation are intact. She is sitting in a wheelchair currently and does not walk independently. Psych: euthymic mood, full affect  Recent Labs: 03/10/2016: ALT 16; B Natriuretic Peptide 393.0* 03/14/2016: Magnesium 1.9 03/20/2016: BUN  48*; Creatinine, Ser 0.67; Hemoglobin 9.8*; Platelets 182; Potassium 3.8; Sodium 135    Lipid Panel No results found for: CHOL, TRIG, HDL, CHOLHDL, VLDL, LDLCALC, LDLDIRECT    Wt Readings from Last 3 Encounters:  03/24/16 166 lb (75.297 kg)  03/20/16 164 lb (74.39 kg)  03/20/16 164 lb 3.9 oz (74.5 kg)      ASSESSMENT AND PLAN:  1.  Atrial fibrillation: She remains anemic with a hemoglobin of 9.8. I am reluctant to start her on anticoagulation at this time as her  hemoglobin had been 11.1 03/18/2016/ She denies any active bleeding, hemoptysis, Page.Alora and anemia profile and have her followup with Dr. Margo AyeHall her primary care physician.continue heart rate controlon current regimen.  2.Combined systolic and diastolic heart failure: she appears euvolemic at this time. No evidence of CHF on exam. Continue current medication regimen.  3. Oxygen-dependent COPD: continue with primary care physician. She remains on this oxygen during physical therapy at the 10 cm post-hospitalization.  Current medicines are reviewed at length with the patient today.    Labs/ tests ordered today include:Anemia Profile and CBC  Orders Placed This Encounter  Procedures  . Vitamin B12  . Folate  . Iron and TIBC  . Ferritin     Disposition:   FU with 3 months.    Signed, Kathy ReiningKathryn Day Deery, NP  03/24/2016 4:02 PM    [facility] [address], [place], [place] [address] Phone: [phone]; Fax: [phone]

## 2016-03-26 ENCOUNTER — Other Ambulatory Visit (HOSPITAL_COMMUNITY)
Admission: AD | Admit: 2016-03-26 | Discharge: 2016-03-26 | Disposition: A | Discharge status: Home / Self Care | Payer: No Typology Code available for payment source | Source: Skilled Nursing Facility | Attending: Internal Medicine | Admitting: Internal Medicine

## 2016-03-26 DIAGNOSIS — D509 Iron deficiency anemia, unspecified: Secondary | ICD-10-CM | POA: Insufficient documentation

## 2016-03-26 LAB — FOLATE: Folate: 5.2 ng/mL — ABNORMAL LOW (ref >5.9)

## 2016-03-26 LAB — IRON AND TIBC
IRON: 30 ug/dL (ref 28–170)
Saturation Ratios: 11 % (ref 10.4–31.8)
TIBC: 266 ug/dL (ref 250–450)
UIBC: 236 ug/dL

## 2016-03-26 LAB — FERRITIN: FERRITIN: 23 ng/mL (ref 11–307)

## 2016-03-26 LAB — VITAMIN B12: Vitamin B-12: 303 pg/mL (ref 180–914)

## 2016-03-28 ENCOUNTER — Encounter (HOSPITAL_COMMUNITY)
Admission: RE | Admit: 2016-03-28 | Discharge: 2016-03-28 | Disposition: A | Discharge status: Home / Self Care | Payer: Medicare Other | Source: Skilled Nursing Facility | Attending: Internal Medicine | Admitting: Internal Medicine

## 2016-03-28 DIAGNOSIS — I2510 Atherosclerotic heart disease of native coronary artery without angina pectoris: Secondary | ICD-10-CM | POA: Insufficient documentation

## 2016-03-28 DIAGNOSIS — R195 Other fecal abnormalities: Secondary | ICD-10-CM | POA: Insufficient documentation

## 2016-03-28 DIAGNOSIS — K2971 Gastritis, unspecified, with bleeding: Secondary | ICD-10-CM | POA: Insufficient documentation

## 2016-03-28 DIAGNOSIS — E785 Hyperlipidemia, unspecified: Secondary | ICD-10-CM | POA: Insufficient documentation

## 2016-03-28 DIAGNOSIS — A047 Enterocolitis due to Clostridium difficile: Secondary | ICD-10-CM | POA: Insufficient documentation

## 2016-03-28 DIAGNOSIS — E1165 Type 2 diabetes mellitus with hyperglycemia: Secondary | ICD-10-CM | POA: Insufficient documentation

## 2016-03-28 DIAGNOSIS — I110 Hypertensive heart disease with heart failure: Secondary | ICD-10-CM | POA: Diagnosis not present

## 2016-03-28 DIAGNOSIS — I10 Essential (primary) hypertension: Secondary | ICD-10-CM | POA: Insufficient documentation

## 2016-03-28 LAB — C DIFFICILE QUICK SCREEN W PCR REFLEX
C DIFFICILE (CDIFF) TOXIN: NEGATIVE
C DIFFICLE (CDIFF) ANTIGEN: NEGATIVE
C Diff interpretation: NEGATIVE

## 2016-04-01 ENCOUNTER — Encounter: Payer: Self-pay | Admitting: Internal Medicine

## 2016-04-01 ENCOUNTER — Non-Acute Institutional Stay (SKILLED_NURSING_FACILITY): Payer: Medicare Other | Admitting: Internal Medicine

## 2016-04-01 DIAGNOSIS — K1379 Other lesions of oral mucosa: Secondary | ICD-10-CM

## 2016-04-01 NOTE — Progress Notes (Signed)
Location:  [facility] Room Number: 126/P Place of Service:  SNF [phone]) Provider:  Phylis Bougie, MD  Patient Care Team: Benita Stabile, MD as PCP - General (Internal Medicine)  Extended Emergency Contact Information Primary Emergency Contact: Lurlean Horns  [address] Home Phone: [phone] Relation: None Secondary Emergency Contact: Dixon,Lou Lindi [place] Mobile Phone: [phone] Relation: None  Code Status:  Full Code Goals of care: Advanced Directive information Advanced Directives 04/01/2016  Does patient have an advance directive? Yes  Type of Advance Directive -  Does patient want to make changes to advanced directive? No - Patient declined  Copy of advanced directive(s) in chart? Yes     Chief Complaint  Patient presents with  . Acute Visit    Examin  patients mouth  Secondary to mild discomfort  HPI:  Pt is a 82 y.o. female seen today for an acute visit for follow-up of mouth discomfort.  We have ordered Magic mouthwash I am following up on this  Patient states her mouth pain apparently is better-physical exam was quite benign Past Medical History  Diagnosis Date  . Essential hypertension, benign   . Hyperlipidemia   . History of GI bleed   . COPD (chronic obstructive pulmonary disease) (HCC)     Oxygen dependent  . Old inferior wall myocardial infarction 1999    [facility]  . Asthma   . Type 2 diabetes mellitus (HCC)   . Coronary atherosclerosis of native coronary artery    Past Surgical History  Procedure Laterality Date  . Cesareen section    . Cholecystectomy    . Exploratory laparotomy    . Partial colectomy    . Thoracentesis Left 03/10/16    1 L transudative fluid  . Coronary artery stent  1999    BMS x 3 RCA  [facility] PTCRA RC4    Allergies  Allergen Reactions  . Codeine Nausea Only  . Heparin Other (See Comments)    Causes internal bleeding     Current Outpatient  Prescriptions on File Prior to Visit  Medication Sig Dispense Refill  . acetaminophen (TYLENOL) 325 MG tablet Take two tablets by mouth every 6 hours as needed for pain. Do not exceed 3000mg  of APAP from all sources within a 24 hours period    . apixaban (ELIQUIS) 5 MG TABS tablet Take 1 tablet (5 mg total) by mouth 2 (two) times daily. 60 tablet 0  . atorvastatin (LIPITOR) 10 MG tablet Take 10 mg by mouth daily. Reported on 12/04/2015    . carvedilol (COREG) 12.5 MG tablet Take 1 tablet (12.5 mg total) by mouth 2 (two) times daily with a meal.    . furosemide (LASIX) 20 MG tablet Take 20 mg by mouth daily. Once an evening at 4:00 pm    . furosemide (LASIX) 40 MG tablet Take 40 mg by mouth daily. Daily at 0800 am    . ipratropium-albuterol (DUONEB) 0.5-2.5 (3) MG/3ML SOLN Take 3 mLs by nebulization 3 (three) times daily. 360 mL   . LORazepam (ATIVAN) 0.5 MG tablet Take 1 tablet (0.5 mg total) by mouth daily. Take one tablet by mouth once daily 30 tablet 0  . nitroGLYCERIN (NITROSTAT) 0.4 MG SL tablet Place 1 tablet (0.4 mg total) under the tongue every 5 (five) minutes as needed for chest pain. 25 tablet 3  . NON FORMULARY Pt on 2.5 liters of O2  daily    . omeprazole (PRILOSEC) 20 MG capsule Take 20 mg by mouth at bedtime.      No current facility-administered medications on file prior to visit.     Review of Systems  Again patient states her mouth pain apparently is resolving  Immunization History  Administered Date(s) Administered  . Influenza-Unspecified 09/19/2013, 07/20/2014   Pertinent  Health Maintenance Due  Topic Date Due  . FOOT EXAM  04/01/2017 (Originally 04/28/1941)  . OPHTHALMOLOGY EXAM  04/01/2017 (Originally 04/28/1941)  . URINE MICROALBUMIN  04/01/2017 (Originally 04/28/1941)  . DEXA SCAN  04/01/2017 (Originally 04/27/1998)  . PNA vac Low Risk Adult (1 of 2 - PCV13) 04/01/2017 (Originally 04/27/1998)  . INFLUENZA VACCINE  05/20/2016  . HEMOGLOBIN A1C  09/17/2016   No  flowsheet data found. Functional Status Survey:    Filed Vitals:   04/01/16 1040  BP: 129/74  Pulse: 88  Temp: 98.2 F (36.8 C)  TempSrc: Oral  Resp: 20  Height:  (1.626 m)  Weight: 160 lb 3.2 oz (72.666 kg)   Body mass index is 27.48 kg/(m^2). Physical Exam   Gen. this is a pleasant LE female in no distress.  Her skin is warm and dry.  Oropharynx is clear mucous membranes moist tongue is midline I do not see any significant mouth abnormalities  Labs reviewed:  Recent Labs  03/14/16 0518  03/18/16 0441 03/19/16 03/19/16 0519 03/20/16 03/20/16 0454  NA 138  < > 141 141  138 138 135* 135  K 3.4*  < > 4.3 4.3  4.0 4.0  --  3.8  CL 81*  < > 80*  --  82*  --  86*  CO2 49*  < > 50*  --  48*  --  42*  GLUCOSE 230*  < > 192*  --  214*  --  210*  BUN 36*  < > 56* 56*  54* 54* 48* 48*  CREATININE 0.75  < > 1.13* 1.1  1.0 0.97 0.7 0.67  CALCIUM 8.3*  < > 8.1*  --  8.4*  --  8.4*  MG 1.9  --   --   --   --   --   --   < > = values in this interval not displayed.  Recent Labs  03/10/16 0625  AST 16  ALT 16  ALKPHOS 42  BILITOT 0.7  PROT 5.8*  ALBUMIN 3.4*    Recent Labs  02/27/16 1220  03/18/16 0441 03/19/16 0519 03/20/16 0454  WBC 5.0  < > 7.1 5.7 5.4  NEUTROABS 3750  --   --   --   --   HGB 12.9  < > 11.1* 9.9* 9.8*  HCT 42.3  < > 38.1 34.0* 31.8*  MCV 95.7  < > 102.4* 102.4* 97.8  PLT 167  < > 151 165 182  < > = values in this interval not displayed. No results found for: TSH Lab Results  Component Value Date   HGBA1C 6.4* 03/17/2016   No results found for: CHOL, HDL, LDLCALC, LDLDIRECT, TRIG, CHOLHDL  Significant Diagnostic Results in last 30 days:  Dg Chest 1 View  03/10/2016  CLINICAL DATA:  LEFT pleural effusion post thoracentesis EXAM: CHEST 1 VIEW COMPARISON:  03/09/2016 FINDINGS: Expiratory technique. Significant decrease in LEFT pleural effusion post thoracentesis. No pneumothorax. Persistent small to moderate LEFT pleural effusion  and basilar atelectasis identified. Small RIGHT pleural effusion also noted. Patient rotated to the LEFT. Dense atherosclerotic calcification and aorta.  Question enlargement of cardiac silhouette. Diffuse osseous demineralization. IMPRESSION: Significant decrease in LEFT pleural effusion post thoracentesis. No pneumothorax. Electronically Signed   By: Ulyses SouthwardMark  Boles M.D.   On: 03/10/2016 13:57   Dg Chest 2 View  03/09/2016  CLINICAL DATA:  Shortness of breath. Weakness. Atrial fibrillation. EXAM: CHEST  2 VIEW COMPARISON:  02/11/2016 FINDINGS: The large left and moderate right pleural effusion. Only about 25% of the left lung is aerated. Left heart border obscured.  Suspected underlying cardiomegaly. Atherosclerotic aortic arch. Thoracic spondylosis. Indistinct pulmonary vasculature. Bony demineralization. IMPRESSION: 1. Large left and moderate right pleural effusions. Only about 25% of the left lung is aerated. 2. Atherosclerosis. 3. Thoracic spondylosis. 4. Indistinct pulmonary vasculature could reflect pulmonary venous hypertension. Electronically Signed   By: Gaylyn RongWalter  Liebkemann M.D.   On: 03/09/2016 17:29   Dg Chest Port 1 View  03/20/2016  CLINICAL DATA:  Respiratory failure. EXAM: PORTABLE CHEST 1 VIEW COMPARISON:  03/18/2016. FINDINGS: Mediastinum hilar structures are unremarkable. Cardiomegaly with progressive bilateral pulmonary interstitial prominence suggesting congestive heart failure. Persistent low lung volumes with left lower lobe atelectasis and/or consolidation. Bilateral pleural effusions left side greater than right again noted. No pneumothorax. IMPRESSION: 1. Cardiomegaly with progressive diffuse bilateral from interstitial prominence consistent with congestive heart failure. Bilateral pleural effusions again noted, left side greater than right. 2.  Left lower lobe atelectasis and/or consolidation. Electronically Signed   By: Maisie Fushomas  Register   On: 03/20/2016 07:05   Dg Chest Port 1  View  03/18/2016  CLINICAL DATA:  Difficulty breathing EXAM: PORTABLE CHEST 1 VIEW COMPARISON:  03/15/2016 FINDINGS: Cardiac shadow is stable. Bilateral pleural effusions are again seen and stable. Left basilar consolidation is again noted. Aortic calcifications are again seen. No acute bony abnormality is noted. IMPRESSION: Bilateral pleural effusions left greater than right. Left basilar consolidation is noted. Electronically Signed   By: Alcide CleverMark  Lukens M.D.   On: 03/18/2016 07:12   Dg Chest Port 1 View  03/15/2016  CLINICAL DATA:  82 year old female with history of congestive heart failure. Weakness and fatigue. EXAM: PORTABLE CHEST 1 VIEW COMPARISON:  Chest x-ray 03/12/2016. FINDINGS: Moderate to large left pleural effusion. Probable passive atelectasis throughout the base of the left lung. Small to moderate right pleural effusion. Probable passive subsegmental atelectasis in the right lower lobe. Mild pulmonary venous congestion, without frank pulmonary edema. Cardiomegaly. The patient is rotated to the left on today's exam, resulting in distortion of the mediastinal contours and reduced diagnostic sensitivity and specificity for mediastinal pathology. Atherosclerosis in the thoracic aorta. IMPRESSION: 1. Cardiomegaly with pulmonary venous congestion, but no frank pulmonary edema. 2. Moderate to large left and small to moderate right pleural effusions with persistent bibasilar atelectasis, as above. 3. Atherosclerosis. Electronically Signed   By: Trudie Reedaniel  Entrikin M.D.   On: 03/15/2016 07:48   Dg Chest Port 1 View  03/12/2016  CLINICAL DATA:  Respiratory failure EXAM: PORTABLE CHEST 1 VIEW COMPARISON:  Chest radiograph from one day prior. FINDINGS: Stable cardiomediastinal silhouette with mild cardiomegaly. No pneumothorax. Stable moderate left pleural effusion. Stable small right pleural effusion. Mild pulmonary edema is not appreciably changed. Patchy left greater than right bilateral lung opacities  appear stable. IMPRESSION: Stable chest radiograph with mild congestive heart failure, moderate left and small right pleural effusions and left greater than right bibasilar lung opacities favoring atelectasis. Electronically Signed   By: Delbert PhenixJason A Poff M.D.   On: 03/12/2016 10:20   Dg Chest Port 1 View  03/11/2016  CLINICAL DATA:  Shortness of breath and weakness EXAM: PORTABLE CHEST 1 VIEW COMPARISON:  03/10/2016 FINDINGS: Cardiac shadow is stable. Some slight increase in the degree of left pleural effusion is noted from the prior exam. Increased density is also noted in the right base likely related to early infiltrate. No pneumothorax is seen. No bony abnormality is noted. IMPRESSION: Slight increase in left pleural effusion with new right basilar infiltrate. Electronically Signed   By: Alcide Clever M.D.   On: 03/11/2016 20:07   US Thoracentesis Asp Pleural Space W/img Guide  03/10/2016  INDICATION: LEFT pleural effusion EXAM: ULTRASOUND GUIDED DIAGNOSTIC AND THERAPEUTIC LEFT THORACENTESIS MEDICATIONS: None. COMPLICATIONS: None immediate. PROCEDURE: Procedure, benefits, and risks of procedure were discussed with patient. Written informed consent for procedure was obtained. Time out protocol followed. Pleural effusion localized by ultrasound at the posterior LEFT hemithorax. Skin prepped and draped in usual sterile fashion. Skin and soft tissues anesthetized with 10 mL of 1% lidocaine. 8 French thoracentesis catheter placed into the 1000 mL pleural space. 1000 mL of amber color fluid was aspirated by syringe pump. Procedure tolerated well by patient without immediate complication. FINDINGS: A total of approximately 1000 mL of LEFT pleural fluid was removed. 180 mL of fluid was sent to the laboratory as requested by the clinical team for analysis. IMPRESSION: Successful ultrasound guided LEFT thoracentesis yielding 1000 mL of pleural fluid. Electronically Signed   By: Ulyses Southward M.D.   On: 03/10/2016 14:08     Assessment/Plan  Mouth discomfort-?? etiology-physical exam today is quite benign mouth appears to be clear at this point  Magic mouth wash apparently is helping at this point will monitor  \[id number]      London Sheer, CMA [phone]

## 2016-04-07 ENCOUNTER — Non-Acute Institutional Stay (SKILLED_NURSING_FACILITY): Payer: Medicare Other | Admitting: Internal Medicine

## 2016-04-07 ENCOUNTER — Encounter: Payer: Self-pay | Admitting: Gastroenterology

## 2016-04-07 ENCOUNTER — Encounter: Payer: Self-pay | Admitting: Internal Medicine

## 2016-04-07 ENCOUNTER — Encounter (HOSPITAL_COMMUNITY)
Admission: RE | Admit: 2016-04-07 | Discharge: 2016-04-07 | Disposition: A | Discharge status: Home / Self Care | Payer: Medicare Other | Source: Skilled Nursing Facility | Attending: *Deleted | Admitting: *Deleted

## 2016-04-07 ENCOUNTER — Ambulatory Visit (INDEPENDENT_AMBULATORY_CARE_PROVIDER_SITE_OTHER): Payer: Medicare Other | Admitting: Gastroenterology

## 2016-04-07 VITALS — BP 142/77 | HR 89 | Temp 97.2°F | Ht 64.0 in | Wt 165.6 lb

## 2016-04-07 DIAGNOSIS — A09 Infectious gastroenteritis and colitis, unspecified: Secondary | ICD-10-CM

## 2016-04-07 DIAGNOSIS — I2510 Atherosclerotic heart disease of native coronary artery without angina pectoris: Secondary | ICD-10-CM | POA: Diagnosis not present

## 2016-04-07 DIAGNOSIS — D649 Anemia, unspecified: Secondary | ICD-10-CM

## 2016-04-07 DIAGNOSIS — R197 Diarrhea, unspecified: Secondary | ICD-10-CM | POA: Insufficient documentation

## 2016-04-07 DIAGNOSIS — A047 Enterocolitis due to Clostridium difficile: Secondary | ICD-10-CM | POA: Diagnosis not present

## 2016-04-07 DIAGNOSIS — R195 Other fecal abnormalities: Secondary | ICD-10-CM | POA: Diagnosis not present

## 2016-04-07 DIAGNOSIS — R600 Localized edema: Secondary | ICD-10-CM

## 2016-04-07 DIAGNOSIS — I6522 Occlusion and stenosis of left carotid artery: Secondary | ICD-10-CM

## 2016-04-07 DIAGNOSIS — I110 Hypertensive heart disease with heart failure: Secondary | ICD-10-CM | POA: Diagnosis not present

## 2016-04-07 DIAGNOSIS — E785 Hyperlipidemia, unspecified: Secondary | ICD-10-CM | POA: Diagnosis not present

## 2016-04-07 DIAGNOSIS — I10 Essential (primary) hypertension: Secondary | ICD-10-CM | POA: Diagnosis not present

## 2016-04-07 LAB — BASIC METABOLIC PANEL
Anion gap: 3 — ABNORMAL LOW (ref 5–15)
BUN: 19 mg/dL (ref 6–20)
CHLORIDE: 104 mmol/L (ref 101–111)
CO2: 30 mmol/L (ref 22–32)
Calcium: 7.9 mg/dL — ABNORMAL LOW (ref 8.9–10.3)
Creatinine, Ser: 0.61 mg/dL (ref 0.44–1.00)
GFR calc Af Amer: >60 mL/min (ref >60)
GFR calc non Af Amer: >60 mL/min (ref >60)
GLUCOSE: 120 mg/dL — AB (ref 65–99)
POTASSIUM: 3.9 mmol/L (ref 3.5–5.1)
Sodium: 137 mmol/L (ref 135–145)

## 2016-04-07 LAB — CBC
HEMATOCRIT: 28.5 % — AB (ref 36.0–46.0)
HEMOGLOBIN: 9.5 g/dL — AB (ref 12.0–15.0)
MCH: 31.6 pg (ref 26.0–34.0)
MCHC: 33.3 g/dL (ref 30.0–36.0)
MCV: 94.7 fL (ref 78.0–100.0)
Platelets: 127 10*3/uL — ABNORMAL LOW (ref 150–400)
RBC: 3.01 MIL/uL — AB (ref 3.87–5.11)
RDW: 15.1 % (ref 11.5–15.5)
WBC: 3.4 10*3/uL — ABNORMAL LOW (ref 4.0–10.5)

## 2016-04-07 LAB — RETICULOCYTES
RBC.: 3.01 MIL/uL — ABNORMAL LOW (ref 3.87–5.11)
RETIC CT PCT: 2.8 % (ref 0.4–3.1)
Retic Count, Absolute: 84.3 10*3/uL (ref 19.0–186.0)

## 2016-04-07 LAB — IRON AND TIBC
Iron: 28 ug/dL (ref 28–170)
SATURATION RATIOS: 8 % — AB (ref 10.4–31.8)
TIBC: 335 ug/dL (ref 250–450)
UIBC: 307 ug/dL

## 2016-04-07 LAB — VITAMIN B12: Vitamin B-12: 234 pg/mL (ref 180–914)

## 2016-04-07 LAB — FERRITIN: FERRITIN: 20 ng/mL (ref 11–307)

## 2016-04-07 LAB — FOLATE: Folate: 13.7 ng/mL (ref >5.9)

## 2016-04-07 NOTE — Assessment & Plan Note (Signed)
As per GI; see orders

## 2016-04-07 NOTE — Progress Notes (Signed)
Patient ID: [name], female   DOB: 04/11/1931, 82 y.o.   MRN: [id number]      This is a nursing facility follow up for specific acute issues of asymmetric edema and pancytopenia. Interim medical record and care since last [facility] visit was updated with review of diagnostic studies and change in clinical status since last visit were documented.  HPI: The patient was seen in consultation by the Gastroneurology PA today to evaluate diarrhea, nausea, and emesis. GI had recommended GI pathogen panel, fecal occult blood, & C. difficile serology assess her active GI symptoms. She has had history of ulcer. 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  She has had persistent anemia with hemoglobins ranging from a high of 11.1 on 5/30 to the present 9.5. She's also developed a pancytopenia picture with a white count of 3400 and platelet count of 127,000. The reticulocyte count is high normal at 2.8%. She was also found to have progressive hypocalcemia. With a value of 7.9 at this time.  Follow-up of asymmetric edema, left greater than right, noted by the PA was recommended to rule out deep venous thrombosis. Deep venous thrombosis be very unlikely in the context of her active Eliquis therapy for atrial fibrillation Albumin is mildly reduced at 3.4; this may be contributing to the edema.   Comprehensive review of systems: She actually describes only occasional frank diarrhea. The stools are usually loose. The edema has been present she believes [facility] . Constitutional: No fever,significant weight change, fatigue  Cardiovascular: No chest pain, palpitations,paroxysmal nocturnal dyspnea, claudication  Respiratory: No cough, sputum production,hemoptysis, DOE , significant snoring,apnea   Gastrointestinal: No heartburn,dysphagia,abdominal pain, nausea / vomiting,rectal bleeding, melena Genitourinary: No dysuria,hematuria,  pyuria,  incontinence, nocturia Dermatologic: No rash, pruritus, change in appearance of skin Hematologic/lymphatic: No significant bruising, lymphadenopathy,abnormal bleeding  Physical exam:  Pertinent or positive findings: She has marked hearing deficit. She is on nasal oxygen. Complete dentures are present. Heart rhythm is irregular She has minimal rales in a scattered distribution area There is a midline abdominal operative scar which is well-healed. She has 1.5+ edema of the left lower extremity with pitting and 1+ on the right. They edema is slightly asymmetric with prominence at the inferior anterior ankle area especially. Homans sign is negative bilaterally. General appearance:Adequately nourished; no acute distress , increased work of breathing is present.   Lymphatic: No lymphadenopathy about the head, neck, axilla . Eyes: No conjunctival inflammation or lid edema is present. There is no scleral icterus. Ears:  External ear exam shows no significant lesions or deformities.   Nose:  External nasal examination shows no deformity or inflammation. Nasal mucosa are pink and moist without lesions ,exudates Oral exam: lips and gums are healthy appearing.There is no oropharyngeal erythema or exudate . Neck:  No thyromegaly, masses, tenderness noted.    Heart:  No gallop, murmur, click, rub .  Abdomen:Bowel sounds are normal. Abdomen is soft and nontender with no organomegaly, hernias,masses. GU: deferred . Extremities:  No cyanosis, clubbing  Neurologic exam : Strength equal  in upper & lower extremities Balance,Rhomberg,finger to nose testing could not be completed due to clinical state Deep tendon reflexes are equal but 0-1/2+ Skin: Warm & dry w/o tenting. No significant lesions or rash.    See summary under each active problem in the Problem List with associated updated therapeutic plan

## 2016-04-07 NOTE — Patient Instructions (Signed)
1. Please collect stool for #1 GI pathogen panel, #2 C. difficile quick scan, #3 iFOBT. (The iFOBT has to be returned to our office). 2. Please have attending evaluate patient's leg swelling, left greater than right, to rule out blood clot.

## 2016-04-07 NOTE — Assessment & Plan Note (Signed)
Anemia stable with recent addition of Eliquis. Mild pancytopenia picture in setting of recent significant illness. No overt GI bleeding. Will check ifobt. Followed closed by Dr. Alwyn RenHopper.

## 2016-04-07 NOTE — Assessment & Plan Note (Addendum)
Venous Doppler not indicated as she is on  Eliquis because of atrial fibrillation Glucerna protein supplementation will be added

## 2016-04-07 NOTE — Progress Notes (Signed)
Primary Care Physician:  Dwana MelenaZack Hall, MD  Primary Gastroenterologist:  Roetta SessionsMichael Rourk, MD   Chief Complaint  Patient presents with  . Diarrhea  . Nausea  . Emesis    HPI:  [name] is a 82 y.o. female here at the request of nursing home attending Dr. Alwyn RenHopper for further evaluation of diarrhea. Patient at [facility] for rehabilitation with hopes of returning home. Recent admission, discharged on 03/20/16. Admitted with acute on chronic respiratory failure with hypoxia due to bilateral pleural effusions/decomplensated CHF and ?COPD exacerbation. She required thoracentesis. She requires BIPAP QHS long-term for hypercarbic failure. Afib started on Eliquis during hospitalization.   Patient reports two month history of intermittent diarrhea. No nocturnal stools. Has some normal BMs on occasion but mostly diarrhea. No abdominal pain. No overt GI bleeding. Doesn't feel bad. Intentional weight loss of 40 pounds. No fever. She have vomiting in the office today but this is first time. She states she drank Boost with her medications this morning and immediately felt sick to her stomach. This is not her normal routine but since she had early appointment and did not get breakfast, Boost was provided. No heartburn. No dysphagia.   She complains of lower extremity edema which was worse over the weekend, L>R. She has an appt with the nursing home attending today for evaluation.   She has had stable mild anemia since discharge, but also with mild leukopenia/thrombocytopenia in setting of recent prolonged illness. On eliquis without overt GI bleeding. H/o gi bleeding in 2005 due to AVMs.  Current Outpatient Prescriptions on File Prior to Visit  Medication Sig Dispense Refill  . acetaminophen (TYLENOL) 325 MG tablet Take two tablets by mouth every 6 hours as needed for pain. Do not exceed 3000mg  of APAP from all sources within a 24 hours period    . apixaban (ELIQUIS) 5 MG TABS tablet Take 1 tablet (5 mg  total) by mouth 2 (two) times daily. 60 tablet 0  . atorvastatin (LIPITOR) 10 MG tablet Take 10 mg by mouth daily. Reported on 12/04/2015    . Balsam [place]-Castor Oil (VENELEX) OINT Apply to sacrum q shift & prn    . carvedilol (COREG) 12.5 MG tablet Take 1 tablet (12.5 mg total) by mouth 2 (two) times daily with a meal.    . digoxin (LANOXIN) 0.125 MG tablet Take 0.125 mg by mouth daily. Hold for AP less than 60    . furosemide (LASIX) 20 MG tablet Take 20 mg by mouth daily. Once an evening at 4:00 pm    . Insulin Glargine (LANTUS) 100 UNIT/ML Solostar Pen Inject 10 Units into the skin at bedtime. Special instructions, If fbs is > 200    . ipratropium-albuterol (DUONEB) 0.5-2.5 (3) MG/3ML SOLN Take 3 mLs by nebulization 3 (three) times daily. 360 mL   . ketoconazole (NIZORAL) 2 % cream Apply sparingly to abdominal folds and peri/rectal area twice a day    . LORazepam (ATIVAN) 0.5 MG tablet Take 1 tablet (0.5 mg total) by mouth daily. Take one tablet by mouth once daily 30 tablet 0  . Multiple Vitamins-Minerals (CENTROVITE) TABS Tablet; 18-400 mg-mcg  1 tablet by mouth once daily    . nitroGLYCERIN (NITROSTAT) 0.4 MG SL tablet Place 1 tablet (0.4 mg total) under the tongue every 5 (five) minutes as needed for chest pain. 25 tablet 3  . NON FORMULARY Pt on 2.5 liters of O2 daily    . omeprazole (PRILOSEC) 20 MG capsule Take 20 mg  by mouth at bedtime.     . saccharomyces boulardii (FLORASTOR) 250 MG capsule Take 250 mg by mouth 2 (two) times daily. 8 am, 9 pm    . furosemide (LASIX) 40 MG tablet Take 40 mg by mouth daily. Daily at 0800 am     No current facility-administered medications on file prior to visit.       Allergies as of 04/07/2016 - Review Complete 04/07/2016  Allergen Reaction Noted  . Codeine Nausea Only 01/14/2011  . Heparin Other (See Comments) 03/09/2016    Past Medical History  Diagnosis Date  . Essential hypertension, benign   . Hyperlipidemia   . History of GI bleed    . COPD (chronic obstructive pulmonary disease) (HCC)     Oxygen dependent  . Old inferior wall myocardial infarction 1999    [facility]  . Asthma   . Type 2 diabetes mellitus (HCC)   . Coronary atherosclerosis of native coronary artery     Past Surgical History  Procedure Laterality Date  . Cesareen section    . Cholecystectomy    . Exploratory laparotomy    . Partial colectomy  1977    blockage/gangrene? unclear if small bowel or colon  . Thoracentesis Left 03/10/16    1 L transudative fluid  . Coronary artery stent  1999    BMS x 3 RCA  ([facility]), PTCRA RC4  . Colonoscopy  09/2004    RMR: For obscure GI bleed. Left-sided diverticula, otherwise normal ileocolonoscopy  . Esophagogastroduodenoscopy  11 2005    RMR: For melena, normal exam.  . Givens capsule study  08/2004    official report unavailable, but reported couple of AVMs, nonbleeding    Family History  Problem Relation Age of Onset  . Diabetes Mother   . Diabetes Father   . Heart disease Father   . Cancer Sister   . Cancer Brother   . Diabetes Brother     Social History   Social History  . Marital Status: Widowed    Spouse Name: N/A  . Number of Children: N/A  . Years of Education: N/A   Occupational History  . Not on file.   Social History Main Topics  . Smoking status: Former Smoker -- 2.00 packs/day for 40 years    Types: Cigarettes    Quit date: 01/13/1998  . Smokeless tobacco: Never Used  . Alcohol Use: No  . Drug Use: No  . Sexual Activity: Not on file   Other Topics Concern  . Not on file   Social History Narrative      ROS:  General: Negative for anorexia, weight loss, fever, chills, fatigue, weakness. Eyes: Negative for vision changes.  ENT: Negative for hoarseness, difficulty swallowing , nasal congestion. CV: Negative for chest pain, angina, palpitations, dyspnea on exertion, +peripheral edema.  Respiratory: Negative for cough, sputum, wheezing. +DOE GI: See history of present  illness. GU:  Negative for dysuria, hematuria, urinary incontinence, urinary frequency, nocturnal urination.  MS: Negative for joint pain, low back pain.  Derm: Negative for rash or itching.  Neuro: Negative for weakness, abnormal sensation, seizure, frequent headaches, memory loss, confusion.  Psych: Negative for anxiety, depression, suicidal ideation, hallucinations.  Endo: Negative for unusual weight change.  Heme: Negative for bruising or bleeding. Allergy: Negative for rash or hives.    Physical Examination:  BP 142/77 mmHg  Pulse 89  Temp(Src) 97.2 F (36.2 C)  Ht 5\' 4"  (1.626 m)  Wt 165 lb 9.6 oz (75.116 kg)  BMI 28.41 kg/m2   General: Well-nourished, well-developed in no acute distress.  Head: Normocephalic, atraumatic.   Eyes: Conjunctiva pink, no icterus. Mouth: Oropharyngeal mucosa moist and pink , no lesions erythema or exudate. Neck: Supple without thyromegaly, masses, or lymphadenopathy.  Lungs: Clear to auscultation bilaterally.  Heart: Regular rate and rhythm, no murmurs rubs or gallops.  Abdomen: Bowel sounds are normal, nontender, nondistended, no hepatosplenomegaly or masses, no abdominal bruits or    hernia , no rebound or guarding.   Rectal: not performed Extremities: 3+left  lower extremity edema. 2+ RLE edema. No clubbing or deformities.  Neuro: Alert and oriented x 4 , grossly normal neurologically.  Skin: Warm and dry, no rash or jaundice.   Psych: Alert and cooperative, normal mood and affect.  Labs: Lab Results  Component Value Date   WBC 3.4* 04/07/2016   HGB 9.5* 04/07/2016   HCT 28.5* 04/07/2016   MCV 94.7 04/07/2016   PLT 127* 04/07/2016   Lab Results  Component Value Date   CREATININE 0.61 04/07/2016   BUN 19 04/07/2016   NA 137 04/07/2016   K 3.9 04/07/2016   CL 104 04/07/2016   CO2 30 04/07/2016   Lab Results  Component Value Date   VITAMINB12 303 03/26/2016   Lab Results  Component Value Date   FOLATE 5.2* 03/26/2016    Lab Results  Component Value Date   IRON 30 03/26/2016   TIBC 266 03/26/2016   FERRITIN 23 03/26/2016   Cdiff quick scan negative 03/28/16  Lab Results  Component Value Date   ALT 16 03/10/2016   AST 16 03/10/2016   ALKPHOS 42 03/10/2016   BILITOT 0.7 03/10/2016    Imaging Studies: Dg Chest 1 View  03/10/2016  CLINICAL DATA:  LEFT pleural effusion post thoracentesis EXAM: CHEST 1 VIEW COMPARISON:  03/09/2016 FINDINGS: Expiratory technique. Significant decrease in LEFT pleural effusion post thoracentesis. No pneumothorax. Persistent small to moderate LEFT pleural effusion and basilar atelectasis identified. Small RIGHT pleural effusion also noted. Patient rotated to the LEFT. Dense atherosclerotic calcification and aorta. Question enlargement of cardiac silhouette. Diffuse osseous demineralization. IMPRESSION: Significant decrease in LEFT pleural effusion post thoracentesis. No pneumothorax. Electronically Signed   By: Ulyses Southward M.D.   On: 03/10/2016 13:57   Dg Chest 2 View  03/09/2016  CLINICAL DATA:  Shortness of breath. Weakness. Atrial fibrillation. EXAM: CHEST  2 VIEW COMPARISON:  02/11/2016 FINDINGS: The large left and moderate right pleural effusion. Only about 25% of the left lung is aerated. Left heart border obscured.  Suspected underlying cardiomegaly. Atherosclerotic aortic arch. Thoracic spondylosis. Indistinct pulmonary vasculature. Bony demineralization. IMPRESSION: 1. Large left and moderate right pleural effusions. Only about 25% of the left lung is aerated. 2. Atherosclerosis. 3. Thoracic spondylosis. 4. Indistinct pulmonary vasculature could reflect pulmonary venous hypertension. Electronically Signed   By: Gaylyn Rong M.D.   On: 03/09/2016 17:29   Dg Chest Port 1 View  03/20/2016  CLINICAL DATA:  Respiratory failure. EXAM: PORTABLE CHEST 1 VIEW COMPARISON:  03/18/2016. FINDINGS: Mediastinum hilar structures are unremarkable. Cardiomegaly with progressive bilateral  pulmonary interstitial prominence suggesting congestive heart failure. Persistent low lung volumes with left lower lobe atelectasis and/or consolidation. Bilateral pleural effusions left side greater than right again noted. No pneumothorax. IMPRESSION: 1. Cardiomegaly with progressive diffuse bilateral from interstitial prominence consistent with congestive heart failure. Bilateral pleural effusions again noted, left side greater than right. 2.  Left lower lobe atelectasis and/or consolidation. Electronically Signed   By: Maisie Fus  Register  On: 03/20/2016 07:05   Dg Chest Port 1 View  03/18/2016  CLINICAL DATA:  Difficulty breathing EXAM: PORTABLE CHEST 1 VIEW COMPARISON:  03/15/2016 FINDINGS: Cardiac shadow is stable. Bilateral pleural effusions are again seen and stable. Left basilar consolidation is again noted. Aortic calcifications are again seen. No acute bony abnormality is noted. IMPRESSION: Bilateral pleural effusions left greater than right. Left basilar consolidation is noted. Electronically Signed   By: Alcide Clever M.D.   On: 03/18/2016 07:12   Dg Chest Port 1 View  03/15/2016  CLINICAL DATA:  82 year old female with history of congestive heart failure. Weakness and fatigue. EXAM: PORTABLE CHEST 1 VIEW COMPARISON:  Chest x-ray 03/12/2016. FINDINGS: Moderate to large left pleural effusion. Probable passive atelectasis throughout the base of the left lung. Small to moderate right pleural effusion. Probable passive subsegmental atelectasis in the right lower lobe. Mild pulmonary venous congestion, without frank pulmonary edema. Cardiomegaly. The patient is rotated to the left on today's exam, resulting in distortion of the mediastinal contours and reduced diagnostic sensitivity and specificity for mediastinal pathology. Atherosclerosis in the thoracic aorta. IMPRESSION: 1. Cardiomegaly with pulmonary venous congestion, but no frank pulmonary edema. 2. Moderate to large left and small to moderate  right pleural effusions with persistent bibasilar atelectasis, as above. 3. Atherosclerosis. Electronically Signed   By: Trudie Reed M.D.   On: 03/15/2016 07:48   Dg Chest Port 1 View  03/12/2016  CLINICAL DATA:  Respiratory failure EXAM: PORTABLE CHEST 1 VIEW COMPARISON:  Chest radiograph from one day prior. FINDINGS: Stable cardiomediastinal silhouette with mild cardiomegaly. No pneumothorax. Stable moderate left pleural effusion. Stable small right pleural effusion. Mild pulmonary edema is not appreciably changed. Patchy left greater than right bilateral lung opacities appear stable. IMPRESSION: Stable chest radiograph with mild congestive heart failure, moderate left and small right pleural effusions and left greater than right bibasilar lung opacities favoring atelectasis. Electronically Signed   By: Delbert Phenix M.D.   On: 03/12/2016 10:20   Dg Chest Port 1 View  03/11/2016  CLINICAL DATA:  Shortness of breath and weakness EXAM: PORTABLE CHEST 1 VIEW COMPARISON:  03/10/2016 FINDINGS: Cardiac shadow is stable. Some slight increase in the degree of left pleural effusion is noted from the prior exam. Increased density is also noted in the right base likely related to early infiltrate. No pneumothorax is seen. No bony abnormality is noted. IMPRESSION: Slight increase in left pleural effusion with new right basilar infiltrate. Electronically Signed   By: Alcide Clever M.D.   On: 03/11/2016 20:07   US Thoracentesis Asp Pleural Space W/img Guide  03/10/2016  INDICATION: LEFT pleural effusion EXAM: ULTRASOUND GUIDED DIAGNOSTIC AND THERAPEUTIC LEFT THORACENTESIS MEDICATIONS: None. COMPLICATIONS: None immediate. PROCEDURE: Procedure, benefits, and risks of procedure were discussed with patient. Written informed consent for procedure was obtained. Time out protocol followed. Pleural effusion localized by ultrasound at the posterior LEFT hemithorax. Skin prepped and draped in usual sterile fashion. Skin and  soft tissues anesthetized with 10 mL of 1% lidocaine. 8 French thoracentesis catheter placed into the 1000 mL pleural space. 1000 mL of amber color fluid was aspirated by syringe pump. Procedure tolerated well by patient without immediate complication. FINDINGS: A total of approximately 1000 mL of LEFT pleural fluid was removed. 180 mL of fluid was sent to the laboratory as requested by the clinical team for analysis. IMPRESSION: Successful ultrasound guided LEFT thoracentesis yielding 1000 mL of pleural fluid. Electronically Signed   By: Angelyn Punt.D.  On: 03/10/2016 14:08

## 2016-04-07 NOTE — Patient Instructions (Signed)
Deep venous thrombosis would be extremely unlikely as she is on Eliquis twice a day and Homans sign is negative.  She does have hypoalbuminemia and Glucerna will be added to increase protein stores. The low albumin could be contributing to the edema.  The asymmetry of the edema would suggest that she's had previous venous insufficiency issues greater on the left.

## 2016-04-07 NOTE — Assessment & Plan Note (Signed)
Hematology consultation will be necessary if there is progressive pancytopenia

## 2016-04-07 NOTE — Assessment & Plan Note (Signed)
82 y/o female with intermittent diarrhea over the past several weeks, noted during recent hospitalization. Cdiff negative 10 days ago. At increased risk with multiple abx, recent admission, nursing home status. Check stools studies initially. Patient physically not fit for conscious sedation at this time given respiratory issues therefore avoid at this time.

## 2016-04-08 ENCOUNTER — Encounter (HOSPITAL_COMMUNITY)
Admission: RE | Admit: 2016-04-08 | Discharge: 2016-04-08 | Disposition: A | Discharge status: Home / Self Care | Payer: Medicare Other | Source: Skilled Nursing Facility | Attending: *Deleted | Admitting: *Deleted

## 2016-04-08 ENCOUNTER — Ambulatory Visit (INDEPENDENT_AMBULATORY_CARE_PROVIDER_SITE_OTHER): Payer: Medicare Other

## 2016-04-08 DIAGNOSIS — I110 Hypertensive heart disease with heart failure: Secondary | ICD-10-CM | POA: Diagnosis not present

## 2016-04-08 DIAGNOSIS — R195 Other fecal abnormalities: Secondary | ICD-10-CM | POA: Diagnosis not present

## 2016-04-08 DIAGNOSIS — D649 Anemia, unspecified: Secondary | ICD-10-CM | POA: Diagnosis not present

## 2016-04-08 DIAGNOSIS — I10 Essential (primary) hypertension: Secondary | ICD-10-CM | POA: Diagnosis not present

## 2016-04-08 DIAGNOSIS — I2510 Atherosclerotic heart disease of native coronary artery without angina pectoris: Secondary | ICD-10-CM | POA: Diagnosis not present

## 2016-04-08 DIAGNOSIS — A047 Enterocolitis due to Clostridium difficile: Secondary | ICD-10-CM | POA: Diagnosis not present

## 2016-04-08 DIAGNOSIS — E785 Hyperlipidemia, unspecified: Secondary | ICD-10-CM | POA: Diagnosis not present

## 2016-04-08 LAB — C DIFFICILE QUICK SCREEN W PCR REFLEX
C DIFFICILE (CDIFF) INTERP: NEGATIVE
C DIFFICILE (CDIFF) TOXIN: NEGATIVE
C DIFFICLE (CDIFF) ANTIGEN: NEGATIVE

## 2016-04-08 LAB — IFOBT (OCCULT BLOOD): IFOBT: NEGATIVE

## 2016-04-08 NOTE — Progress Notes (Signed)
Pt returned IFOBT test and it was NEGATIVE 

## 2016-04-08 NOTE — Progress Notes (Signed)
cc'ed to pcp °

## 2016-04-09 LAB — GASTROINTESTINAL PANEL BY PCR, STOOL (REPLACES STOOL CULTURE)
ADENOVIRUS F40/41: NOT DETECTED
Astrovirus: NOT DETECTED
CRYPTOSPORIDIUM: NOT DETECTED
CYCLOSPORA CAYETANENSIS: NOT DETECTED
Campylobacter species: NOT DETECTED
E. coli O157: NOT DETECTED
ENTEROAGGREGATIVE E COLI (EAEC): NOT DETECTED
ENTEROPATHOGENIC E COLI (EPEC): NOT DETECTED
Entamoeba histolytica: NOT DETECTED
Enterotoxigenic E coli (ETEC): NOT DETECTED
GIARDIA LAMBLIA: NOT DETECTED
NOROVIRUS GI/GII: NOT DETECTED
PLESIMONAS SHIGELLOIDES: NOT DETECTED
ROTAVIRUS A: NOT DETECTED
SHIGA LIKE TOXIN PRODUCING E COLI (STEC): NOT DETECTED
SHIGELLA/ENTEROINVASIVE E COLI (EIEC): NOT DETECTED
Salmonella species: NOT DETECTED
Sapovirus (I, II, IV, and V): NOT DETECTED
Vibrio cholerae: NOT DETECTED
Vibrio species: NOT DETECTED
Yersinia enterocolitica: NOT DETECTED

## 2016-04-09 NOTE — Progress Notes (Signed)
Quick Note:  Cdiff negative, ifobt negative. Awaiting gi pathogen panel. ______

## 2016-04-12 LAB — METHYLMALONIC ACID, SERUM: METHYLMALONIC ACID, QUANTITATIVE: 506 nmol/L — AB (ref 0–378)

## 2016-04-13 NOTE — Progress Notes (Signed)
Quick Note:  Please make sure patient's daughter is aware of results. Patient is currently in rehab at [facility].  Please find out if patient is on a fiber supplement. If not, start benefiber 2 tsp bid. How many stools daily is she having. Return to the office in 3 weeks.    ______

## 2016-04-14 ENCOUNTER — Encounter (HOSPITAL_COMMUNITY)
Admission: RE | Admit: 2016-04-14 | Discharge: 2016-04-14 | Disposition: A | Discharge status: Home / Self Care | Payer: Medicare Other | Source: Skilled Nursing Facility | Attending: *Deleted | Admitting: *Deleted

## 2016-04-14 DIAGNOSIS — I110 Hypertensive heart disease with heart failure: Secondary | ICD-10-CM | POA: Diagnosis not present

## 2016-04-14 DIAGNOSIS — R195 Other fecal abnormalities: Secondary | ICD-10-CM | POA: Diagnosis not present

## 2016-04-14 DIAGNOSIS — I10 Essential (primary) hypertension: Secondary | ICD-10-CM | POA: Diagnosis not present

## 2016-04-14 DIAGNOSIS — E785 Hyperlipidemia, unspecified: Secondary | ICD-10-CM | POA: Diagnosis not present

## 2016-04-14 DIAGNOSIS — A047 Enterocolitis due to Clostridium difficile: Secondary | ICD-10-CM | POA: Diagnosis not present

## 2016-04-14 DIAGNOSIS — I2510 Atherosclerotic heart disease of native coronary artery without angina pectoris: Secondary | ICD-10-CM | POA: Diagnosis not present

## 2016-04-14 LAB — CBC WITH DIFFERENTIAL/PLATELET
BASOS ABS: 0 10*3/uL (ref 0.0–0.1)
BASOS PCT: 0 %
Eosinophils Absolute: 0.1 10*3/uL (ref 0.0–0.7)
Eosinophils Relative: 1 %
HEMATOCRIT: 30 % — AB (ref 36.0–46.0)
Hemoglobin: 9.1 g/dL — ABNORMAL LOW (ref 12.0–15.0)
Lymphocytes Relative: 41 %
Lymphs Abs: 2.2 10*3/uL (ref 0.7–4.0)
MCH: 29.2 pg (ref 26.0–34.0)
MCHC: 30.3 g/dL (ref 30.0–36.0)
MCV: 96.2 fL (ref 78.0–100.0)
MONO ABS: 0.6 10*3/uL (ref 0.1–1.0)
Monocytes Relative: 11 %
NEUTROS ABS: 2.5 10*3/uL (ref 1.7–7.7)
NEUTROS PCT: 47 %
Platelets: 245 10*3/uL (ref 150–400)
RBC: 3.12 MIL/uL — ABNORMAL LOW (ref 3.87–5.11)
RDW: 14.5 % (ref 11.5–15.5)
WBC: 5.5 10*3/uL (ref 4.0–10.5)

## 2016-04-15 ENCOUNTER — Encounter: Payer: Self-pay | Admitting: Internal Medicine

## 2016-04-15 ENCOUNTER — Other Ambulatory Visit: Payer: Self-pay

## 2016-04-15 ENCOUNTER — Non-Acute Institutional Stay (SKILLED_NURSING_FACILITY): Payer: Medicare Other | Admitting: Internal Medicine

## 2016-04-15 DIAGNOSIS — R197 Diarrhea, unspecified: Secondary | ICD-10-CM | POA: Diagnosis not present

## 2016-04-15 DIAGNOSIS — I4891 Unspecified atrial fibrillation: Secondary | ICD-10-CM | POA: Diagnosis not present

## 2016-04-15 DIAGNOSIS — D649 Anemia, unspecified: Secondary | ICD-10-CM

## 2016-04-15 MED ORDER — LORAZEPAM 0.5 MG PO TABS: 0.5000 mg | ORAL_TABLET | Freq: Every day | ORAL | Status: DC

## 2016-04-15 NOTE — Progress Notes (Signed)
Location:   [facility] Room Number: 126/D Place of Service:  SNF (31) Provider:  Phylis Bougie, MD  Patient Care Team: Benita Stabile, MD as PCP - General (Internal Medicine) Corbin Ade, MD as Consulting Physician (Gastroenterology)  Extended Emergency Contact Information Primary Emergency Contact: Lurlean Horns,  [address] Home Phone: [phone] Relation: None Secondary Emergency Contact: Dixon,Lou Lindi [place] Mobile Phone: [phone] Relation: None  Code Status:  Full Code Goals of care: Advanced Directive information Advanced Directives 04/15/2016  Does patient have an advance directive? Yes  Does patient want to make changes to advanced directive? No - Patient declined  Copy of advanced directive(s) in chart? Yes    Chief complaint acute visit follow-up anemia   HPI:  Pt is a 82 y.o. female seen today for an acute visit for follow-up anemia.--He is medically recently to the hospital for acute on chronic respiratory failure with hypoxia secondary to bilateral pleural effusions decompensated CHF and possible COPD exasperation-she did undergo a left-sided total centesis with transudative fluid removal.  She was discharged on oral Lasix.  She also continues on BiPAP.  This is been quite stable during her stay here.  She was also found to be A. fib with rapid ventricular rate was started on Eliquest  She is also had some diarrhea and he was seen by GI who did stool testing have recommended a fiber supplement.  She still continues to have diarrhea at times.  He is not complaining of any abdominal pain or gross rectal bleeding.  She does have a history of mild thrombocytopenia as well platelets were as low as 127,000 on June 19 however this has rebounded to normal range on lab done on June 26 with platelets of 20 45,000.  Her hemoglobin over the past month has ranged in the nines and tens was 10.9 on May 27 9.9 on  May 31-on June 19 was 9.5 and on lab done yesterday June 26 was 9.1.  She has been started on iron as of June 19.  It appears occult blood testing was done on June 20 and was negative  She does have a past history of GI bleed apparently secondary to an ulcer  I also note on lab B12 was borderline low and has been started on B12 supplementation approximately a week ago         Past Medical History  Diagnosis Date  . Essential hypertension, benign   . Hyperlipidemia   . History of GI bleed     ulcer  . COPD (chronic obstructive pulmonary disease) (HCC)     Oxygen dependent  . Old inferior wall myocardial infarction 1999    [facility]  . Asthma   . Type 2 diabetes mellitus (HCC)   . Coronary atherosclerosis of native coronary artery   . A-fib [facility])    Past Surgical History  Procedure Laterality Date  . Cesareen section    . Cholecystectomy    . Exploratory laparotomy    . Partial colectomy  1977    blockage/gangrene? unclear if small bowel or colon  . Thoracentesis Left 03/10/16    1 L transudative fluid  . Coronary artery stent  1999    BMS x 3 RCA  ([facility]), PTCRA RC4  . Colonoscopy  09/2004    RMR: For obscure GI bleed. Left-sided diverticula, otherwise normal ileocolonoscopy  . Esophagogastroduodenoscopy  11 2005  RMR: For melena, normal exam.  . Givens capsule study  08/2004    official report unavailable, but reported couple of AVMs, nonbleeding    Allergies  Allergen Reactions  . Codeine Nausea Only  . Heparin Other (See Comments)    Causes internal bleeding     Outpatient Encounter Prescriptions as of 04/15/2016  Medication Sig  . acetaminophen (TYLENOL) 325 MG tablet Take two tablets by mouth every 6 hours as needed for pain. Do not exceed  of APAP from all sources within a 24 hours period  . apixaban (ELIQUIS) 5 MG TABS tablet Take 1 tablet (5 mg total) by mouth 2 (two) times daily.  Marland Kitchen atorvastatin (LIPITOR) 10 MG tablet Take 10 mg by mouth daily.  Reported on 12/04/2015  . Balsam [place]-Castor Oil (VENELEX) OINT Apply to sacrum q shift & prn  . Calcium 500 MG CHEW Take 1000 mg by mouth once a day  . carvedilol (COREG) 12.5 MG tablet Take 1 tablet (12.5 mg total) by mouth 2 (two) times daily with a meal.  . digoxin (LANOXIN) 0.125 MG tablet Take 0.125 mg by mouth daily. Hold for AP less than 60  . feeding supplement, GLUCERNA SHAKE, (GLUCERNA SHAKE) LIQD Take 237 mLs by mouth every evening.  . ferrous sulfate 325 (65 FE) MG tablet Take 325 mg by mouth daily with breakfast.  . furosemide (LASIX) 20 MG tablet Take 20 mg by mouth daily. Once an evening at 4:00 pm  . furosemide (LASIX) 40 MG tablet Take 40 mg by mouth daily. Daily at 0800 am  . Insulin Glargine (LANTUS) 100 UNIT/ML Solostar Pen Inject 10 Units into the skin at bedtime. Special instructions, If fbs is > 200  . ipratropium-albuterol (DUONEB) 0.5-2.5 (3) MG/3ML SOLN Take 3 mLs by nebulization 3 (three) times daily.  Marland Kitchen ketoconazole (NIZORAL) 2 % cream Apply sparingly to abdominal folds and peri/rectal area twice a day  . LORazepam (ATIVAN) 0.5 MG tablet Take 1 tablet (0.5 mg total) by mouth daily. Take one tablet by mouth once daily  . Multiple Vitamins-Minerals (CENTROVITE) TABS Tablet; 18-400 mg-mcg  1 tablet by mouth once daily  . nitroGLYCERIN (NITROSTAT) 0.4 MG SL tablet Place 1 tablet (0.4 mg total) under the tongue every 5 (five) minutes as needed for chest pain.  . NON FORMULARY Pt on 2.5 liters of O2 daily  . NON FORMULARY Bipap qhs with IPAP of 20 and EPAP of 5. Tidal volume of 600 and respiratory rate of 16. Twice a day  . omeprazole (PRILOSEC) 20 MG capsule Take 20 mg by mouth at bedtime.   . Probiotic Product (RISA-BID PROBIOTIC PO) 1 billion-250 cell-mg ; by mouth twice a day  . vitamin B-12 (CYANOCOBALAMIN) 1000 MCG tablet Take 1,000 mcg by mouth daily.  . [DISCONTINUED] saccharomyces boulardii (FLORASTOR) 250 MG capsule Take 250 mg by mouth 2 (two) times daily. 8 am, 9  pm   No facility-administered encounter medications on file as of 04/15/2016.     Review of Systems   In general does not complaining any fever or chills.  GEN is not complaining of rash or itching.  Head ears eyes nose mouth throat does not complain of sore throat is oh changes.  Respiratory extensive history as noted above but is not complaining of increased shortness of breath or cough at this time.  Cardiac history of atrial fibrillation is not complaining of chest pain has I would say one plus lower extremity edema which apparently is baseline.  GI  does complain of diarrhea does not complain of abdominal pain however.  Musculoskeletal is not complaining of joint pain. Neurologic does not complain of dizziness or syncope or headache.   psych is not complaining of depression or anxiety she continues to be bright alert and interactive  Immunization History  Administered Date(s) Administered  . Influenza-Unspecified 09/19/2013, 07/20/2014   Pertinent  Health Maintenance Due  Topic Date Due  . FOOT EXAM  04/01/2017 (Originally 04/28/1941)  . OPHTHALMOLOGY EXAM  04/01/2017 (Originally 04/28/1941)  . URINE MICROALBUMIN  04/01/2017 (Originally 04/28/1941)  . DEXA SCAN  04/01/2017 (Originally 04/27/1998)  . PNA vac Low Risk Adult (1 of 2 - PCV13) 04/01/2017 (Originally 04/27/1998)  . INFLUENZA VACCINE  05/20/2016  . HEMOGLOBIN A1C  09/17/2016   No flowsheet data found. Functional Status Survey:    Filed Vitals:   04/15/16 1516  BP: 129/73  Pulse: 84  Temp: 97.9 F (36.6 C)  TempSrc: Oral  Resp: 18  Height: 5\' 4"  (1.626 m)  Weight: 165 lb 14.4 oz (75.252 kg)   Body mass index is 28.46 kg/(m^2). Physical Exam   In general this is a pleasant elderly female in no distress.  Her skin is warm and dry.  Eyes she has prescription lenses visual acuity appears grossly intact.  Chest is clear to auscultation with somewhat shallow air entry no labored breathing.  Heart is distant  heart sounds irregular irregular rate and rhythm she has I would say one plus lower extremity edema.  Her abdomen soft nontender she has a well-healed surgical scar bowel sounds are slightly hypoactive.  Rectal exam for occult blood was equivocal there was not really much sample to get an accurate test.  Muscle skeletal does move all extremities 4 ambulating in a wheelchair today.  Neurologic is grossly intact her speech is clear no lateralizing findings.  Psych she continues to be pleasant and appropriate.    Labs reviewed:  Recent Labs  03/14/16 0518  03/19/16 0519 03/20/16 03/20/16 0454 04/07/16 0530  NA 138  < > 138 135* 135 137  K 3.4*  < > 4.0  --  3.8 3.9  CL 81*  < > 82*  --  86* 104  CO2 49*  < > 48*  --  42* 30  GLUCOSE 230*  < > 214*  --  210* 120*  BUN 36*  < > 54* 48* 48* 19  CREATININE 0.75  < > 0.97 0.7 0.67 0.61  CALCIUM 8.3*  < > 8.4*  --  8.4* 7.9*  MG 1.9  --   --   --   --   --   < > = values in this interval not displayed.  Recent Labs  03/10/16 0625  AST 16  ALT 16  ALKPHOS 42  BILITOT 0.7  PROT 5.8*  ALBUMIN 3.4*    Recent Labs  02/27/16 1220  03/20/16 0454 04/07/16 0530 04/14/16 0600  WBC 5.0  < > 5.4 3.4* 5.5  NEUTROABS 3750  --   --   --  2.5  HGB 12.9  < > 9.8* 9.5* 9.1*  HCT 42.3  < > 31.8* 28.5* 30.0*  MCV 95.7  < > 97.8 94.7 96.2  PLT 167  < > 182 127* 245  < > = values in this interval not displayed. No results found for: TSH Lab Results  Component Value Date   HGBA1C 6.4* 03/17/2016   No results found for: CHOL, HDL, LDLCALC, LDLDIRECT, TRIG, CHOLHDL  Significant Diagnostic  Results in last 30 days:  Dg Chest Port 1 View  03/20/2016  CLINICAL DATA:  Respiratory failure. EXAM: PORTABLE CHEST 1 VIEW COMPARISON:  03/18/2016. FINDINGS: Mediastinum hilar structures are unremarkable. Cardiomegaly with progressive bilateral pulmonary interstitial prominence suggesting congestive heart failure. Persistent low lung volumes with  left lower lobe atelectasis and/or consolidation. Bilateral pleural effusions left side greater than right again noted. No pneumothorax. IMPRESSION: 1. Cardiomegaly with progressive diffuse bilateral from interstitial prominence consistent with congestive heart failure. Bilateral pleural effusions again noted, left side greater than right. 2.  Left lower lobe atelectasis and/or consolidation. Electronically Signed   By: Maisie Fushomas  Register   On: 03/20/2016 07:05   Dg Chest Port 1 View  03/18/2016  CLINICAL DATA:  Difficulty breathing EXAM: PORTABLE CHEST 1 VIEW COMPARISON:  03/15/2016 FINDINGS: Cardiac shadow is stable. Bilateral pleural effusions are again seen and stable. Left basilar consolidation is again noted. Aortic calcifications are again seen. No acute bony abnormality is noted. IMPRESSION: Bilateral pleural effusions left greater than right. Left basilar consolidation is noted. Electronically Signed   By: Alcide CleverMark  Lukens M.D.   On: 03/18/2016 07:12    Assessment/Plan  Anemia-hemoglobin appears relatively stable-iron was started on June 19--occult blood testing done on June 20 was negative-test today was unequivocal really not much sample to test-will update this on Friday-of note she also has been started on B12 She is on a proton pump inhibitor has a previous history of ulcer Continue to guaiac stools 3  She has been started on Eliquist  for A. fib she is on Coreg for rate control this appears to be stable  -  Regards diarrhea at this point will monitor will update a metabolic panel as well on Friday-Dr. Alwyn RenHopper has started her on a probiotic- C. difficile testing has been negative by GI  [id number] .      London SheerLuster, Sally C, [place]MA [phone]

## 2016-04-15 NOTE — Telephone Encounter (Signed)
Rx faxed to [facility] at [phone].   [address] [place], [place] [address]  Phone #: [phone] 

## 2016-04-16 ENCOUNTER — Encounter (HOSPITAL_COMMUNITY)
Admission: RE | Admit: 2016-04-16 | Discharge: 2016-04-16 | Disposition: A | Discharge status: Home / Self Care | Payer: Medicare Other | Source: Skilled Nursing Facility | Attending: *Deleted | Admitting: *Deleted

## 2016-04-16 DIAGNOSIS — I110 Hypertensive heart disease with heart failure: Secondary | ICD-10-CM | POA: Diagnosis not present

## 2016-04-16 DIAGNOSIS — I2510 Atherosclerotic heart disease of native coronary artery without angina pectoris: Secondary | ICD-10-CM | POA: Diagnosis not present

## 2016-04-16 DIAGNOSIS — E785 Hyperlipidemia, unspecified: Secondary | ICD-10-CM | POA: Diagnosis not present

## 2016-04-16 DIAGNOSIS — A047 Enterocolitis due to Clostridium difficile: Secondary | ICD-10-CM | POA: Diagnosis not present

## 2016-04-16 DIAGNOSIS — I10 Essential (primary) hypertension: Secondary | ICD-10-CM | POA: Diagnosis not present

## 2016-04-16 DIAGNOSIS — R195 Other fecal abnormalities: Secondary | ICD-10-CM | POA: Diagnosis not present

## 2016-04-17 ENCOUNTER — Other Ambulatory Visit (HOSPITAL_COMMUNITY)
Admission: RE | Admit: 2016-04-17 | Discharge: 2016-04-17 | Disposition: A | Discharge status: Home / Self Care | Payer: No Typology Code available for payment source | Source: Skilled Nursing Facility | Attending: Internal Medicine | Admitting: Internal Medicine

## 2016-04-17 DIAGNOSIS — K2971 Gastritis, unspecified, with bleeding: Secondary | ICD-10-CM | POA: Insufficient documentation

## 2016-04-17 LAB — OCCULT BLOOD X 1 CARD TO LAB, STOOL
Fecal Occult Bld: NEGATIVE
Fecal Occult Bld: NEGATIVE

## 2016-04-18 ENCOUNTER — Non-Acute Institutional Stay (SKILLED_NURSING_FACILITY): Payer: Medicare Other | Admitting: Internal Medicine

## 2016-04-18 ENCOUNTER — Encounter (HOSPITAL_COMMUNITY)
Admission: RE | Admit: 2016-04-18 | Discharge: 2016-04-18 | Disposition: A | Discharge status: Home / Self Care | Payer: Medicare Other | Attending: *Deleted | Admitting: *Deleted

## 2016-04-18 ENCOUNTER — Encounter: Payer: Self-pay | Admitting: Internal Medicine

## 2016-04-18 ENCOUNTER — Encounter (HOSPITAL_COMMUNITY)
Admission: RE | Admit: 2016-04-18 | Discharge: 2016-04-18 | Disposition: A | Discharge status: Home / Self Care | Payer: Medicare Other | Source: Skilled Nursing Facility | Attending: *Deleted | Admitting: *Deleted

## 2016-04-18 DIAGNOSIS — I110 Hypertensive heart disease with heart failure: Secondary | ICD-10-CM | POA: Diagnosis not present

## 2016-04-18 DIAGNOSIS — D649 Anemia, unspecified: Secondary | ICD-10-CM

## 2016-04-18 DIAGNOSIS — J9621 Acute and chronic respiratory failure with hypoxia: Secondary | ICD-10-CM

## 2016-04-18 DIAGNOSIS — I4891 Unspecified atrial fibrillation: Secondary | ICD-10-CM | POA: Diagnosis not present

## 2016-04-18 DIAGNOSIS — I10 Essential (primary) hypertension: Secondary | ICD-10-CM | POA: Diagnosis not present

## 2016-04-18 DIAGNOSIS — R195 Other fecal abnormalities: Secondary | ICD-10-CM | POA: Diagnosis not present

## 2016-04-18 DIAGNOSIS — A047 Enterocolitis due to Clostridium difficile: Secondary | ICD-10-CM | POA: Diagnosis not present

## 2016-04-18 DIAGNOSIS — I2510 Atherosclerotic heart disease of native coronary artery without angina pectoris: Secondary | ICD-10-CM

## 2016-04-18 DIAGNOSIS — E785 Hyperlipidemia, unspecified: Secondary | ICD-10-CM | POA: Diagnosis not present

## 2016-04-18 LAB — CBC
HCT: 30 % — ABNORMAL LOW (ref 36.0–46.0)
HEMOGLOBIN: 9.7 g/dL — AB (ref 12.0–15.0)
MCH: 30.1 pg (ref 26.0–34.0)
MCHC: 32.3 g/dL (ref 30.0–36.0)
MCV: 93.2 fL (ref 78.0–100.0)
PLATELETS: 194 10*3/uL (ref 150–400)
RBC: 3.22 MIL/uL — AB (ref 3.87–5.11)
RDW: 14.8 % (ref 11.5–15.5)
WBC: 5.8 10*3/uL (ref 4.0–10.5)

## 2016-04-18 LAB — BASIC METABOLIC PANEL
Anion gap: 7 (ref 5–15)
BUN: 18 mg/dL (ref 6–20)
CALCIUM: 8.6 mg/dL — AB (ref 8.9–10.3)
CO2: 28 mmol/L (ref 22–32)
CREATININE: 0.8 mg/dL (ref 0.44–1.00)
Chloride: 104 mmol/L (ref 101–111)
GFR calc Af Amer: >60 mL/min (ref >60)
GLUCOSE: 111 mg/dL — AB (ref 65–99)
POTASSIUM: 3.8 mmol/L (ref 3.5–5.1)
SODIUM: 139 mmol/L (ref 135–145)

## 2016-04-18 LAB — OCCULT BLOOD X 1 CARD TO LAB, STOOL: FECAL OCCULT BLD: NEGATIVE

## 2016-04-18 NOTE — Progress Notes (Signed)
Location:   [facility] Room Number: 157/W Place of Service:  SNF [phone]) Provider:  Phylis BougieArlo Lassen  Zack Hall, MD  Patient Care Team: Benita StabileJohn Z Hall, MD as PCP - General (Internal Medicine) Corbin Adeobert M Rourk, MD as Consulting Physician (Gastroenterology)  Extended Emergency Contact Information Primary Emergency Contact: Lurlean HornsLVERSON,[name]          [place],  [id number] Home Phone: [phone] Relation: None Secondary Emergency Contact: Dixon,Lou Lindi AdieAnn  [place] Mobile Phone: [phone] Relation: None  Code Status:  DNR Goals of care: Advanced Directive information Advanced Directives 04/18/2016  Does patient have an advance directive? Yes  Does patient want to make changes to advanced directive? No - Patient declined  Copy of advanced directive(s) in chart? Yes     Chief Complaint  Patient presents with  . Discharge Note    HPI:  Pt is a 82 y.o. female seen today For discharge. She was recently hospitalized for acute on chronic respiratory failure with hypoxia secondary to bilateral pleural effusions decompensated CHF and possible COPD exasperation she did undergo a left-sided thoracocentesis with trends U David of fluid removal.  She's been discharged on oral Lasix this appears to have been stable during her stay here.  She also continues on BiPAP  She was also found to be in A. fib with rapid regular rate and was started on request.  This is been stable during her stay here as well.  At one point she was complaining of diarrhea she saw GI and they recommended a fiber supplement this appears to have stabilized as well.  She does have a history of thrombocytopenia with platelets as low as 127,000 on June 19 however this has rebounded currently 194,000 on lab done today.  Her hemoglobin IS RANGE FROM THE NINES AND TENS WAS 10.9 ON MAY 27 9.9 ON MAY 31 JUNE 19 WAS 9.5 AND ON LAB DONE JUNE 28 WAS 9.1 ON LAB DONE TODAY HOWEVER THIS HAS RESPONDED UP TO  9.7.  SHE HAS BEEN STARTED ON IRON.  OCCULT BLOOD TESTING WAS ALSO ORDERED THIS HAS BEEN NEGATIVE 3  She does have a history of GI bleed apparently secondary to an ulcer.  On recent lab B12 was borderline low she's been started on B12 supplementation as well.  Patient has gained strength she will need continued PT and OT for strengthening as well as nursing to evaluate and treat her multiple medical issues-she also will need a BiPAP.  She is looking forward to going home she does live by herself but has been quite self-sufficient and is confident that with the nursing support she will do okay  She does have a history of diabetes to diet-controlled blood sugars appear to be well controlled ranging largely from the high 90s to low 100s in the morning at night somewhat more variability but rarely above 200.  She does have an order for Lantus at night if blood sugar is greater than 200 although it appears this is rarely y needed       Past Medical History  Diagnosis Date  . Essential hypertension, benign   . Hyperlipidemia   . History of GI bleed     ulcer  . COPD (chronic obstructive pulmonary disease) (HCC)     Oxygen dependent  . Old inferior wall myocardial infarction 1999    [facility]  . Asthma   . Type 2 diabetes mellitus (HCC)   . Coronary atherosclerosis of native coronary artery   . [facility]    Past Surgical History  Procedure Laterality Date  . Cesareen section    . Cholecystectomy    . Exploratory laparotomy    . Partial colectomy  1977    blockage/gangrene? unclear if small bowel or colon  . Thoracentesis Left 03/10/16    1 L transudative fluid  . Coronary artery stent  1999    BMS x 3 RCA  ([facility]), PTCRA RC4  . Colonoscopy  09/2004    RMR: For obscure GI bleed. Left-sided diverticula, otherwise normal ileocolonoscopy  . Esophagogastroduodenoscopy  11 2005    RMR: For melena, normal exam.  . Givens capsule study  08/2004    official report unavailable, but  reported couple of AVMs, nonbleeding    Allergies  Allergen Reactions  . Codeine Nausea Only  . Heparin Other (See Comments)    Causes internal bleeding     Current Outpatient Prescriptions on File Prior to Visit  Medication Sig Dispense Refill  . acetaminophen (TYLENOL) 325 MG tablet Take two tablets by mouth every 6 hours as needed for pain. Do not exceed 3000mg  of APAP from all sources within a 24 hours period    . apixaban (ELIQUIS) 5 MG TABS tablet Take 1 tablet (5 mg total) by mouth 2 (two) times daily. 60 tablet 0  . atorvastatin (LIPITOR) 10 MG tablet Take 10 mg by mouth daily. Reported on 12/04/2015    . Balsam Peru-Castor Oil (VENELEX) OINT Apply to sacrum q shift & prn    . Calcium 500 MG CHEW Take 1000 mg by mouth once a day    . carvedilol (COREG) 12.5 MG tablet Take 1 tablet (12.5 mg total) by mouth 2 (two) times daily with a meal.    . digoxin (LANOXIN) 0.125 MG tablet Take 0.125 mg by mouth daily. Hold for AP less than 60    . feeding supplement, GLUCERNA SHAKE, (GLUCERNA SHAKE) LIQD Take 237 mLs by mouth every evening.    . ferrous sulfate 325 (65 FE) MG tablet Take 325 mg by mouth daily with breakfast.    . furosemide (LASIX) 20 MG tablet Take 20 mg by mouth daily. Once an evening at 4:00 pm    . furosemide (LASIX) 40 MG tablet Take 40 mg by mouth daily. Daily at 0800 am    . Insulin Glargine (LANTUS) 100 UNIT/ML Solostar Pen Inject 10 Units into the skin at bedtime. Special instructions, If fbs is > 200    . ipratropium-albuterol (DUONEB) 0.5-2.5 (3) MG/3ML SOLN Take 3 mLs by nebulization 3 (three) times daily. 360 mL   . ketoconazole (NIZORAL) 2 % cream Apply sparingly to abdominal folds and peri/rectal area twice a day    . LORazepam (ATIVAN) 0.5 MG tablet Take 1 tablet (0.5 mg total) by mouth daily. Take one tablet by mouth once daily 30 tablet 0  . Multiple Vitamins-Minerals (CENTROVITE) TABS Tablet; 18-400 mg-mcg  1 tablet by mouth once daily    . nitroGLYCERIN  (NITROSTAT) 0.4 MG SL tablet Place 1 tablet (0.4 mg total) under the tongue every 5 (five) minutes as needed for chest pain. 25 tablet 3  . NON FORMULARY Pt on 2.5 liters of O2 daily    . NON FORMULARY Bipap qhs with IPAP of 20 and EPAP of 5. Tidal volume of 600 and respiratory rate of 16. Twice a day    . omeprazole (PRILOSEC) 20 MG capsule Take 20 mg by mouth at bedtime.     . Probiotic Product (RISA-BID  PROBIOTIC PO) 1 billion-250 cell-mg ; by mouth twice a day    . vitamin B-12 (CYANOCOBALAMIN) 1000 MCG tablet Take 1,000 mcg by mouth daily.     No current facility-administered medications on file prior to visit.    Review of Systems   In general has no complaints is looking forward to going home.  Denies any fever or chills.  Skin is not complaining of rashes or itching.  Head ears old nose mouth and throat does not complaining of sore throat nasal discharge or visual changes.  Respiratory is not complaining of shortness of breath does have a history of oxygen dependence will need BiPAP at home as well as oxygen.  Does not complaining cough.  Cardiac is not complaining of chest pain does have what appears to be fairly chronic lower extremity edema at baseline.  GI does not complaining of nausea vomiting diarrhea apparently has improved.  GU does not complaining of dysuria.  Muscle skeletal still has some weakness largely amputating in a wheelchair she does not complain of joint pain however.  Neurologic is not complaining of dizziness headache or syncopal-type feelings.   psych appears to be good spirits does not complaining of depression or anxiety    Immunization History  Administered Date(s) Administered  . Influenza-Unspecified 09/19/2013, 07/20/2014   Pertinent  Health Maintenance Due  Topic Date Due  . FOOT EXAM  04/01/2017 (Originally 04/28/1941)  . OPHTHALMOLOGY EXAM  04/01/2017 (Originally 04/28/1941)  . URINE MICROALBUMIN  04/01/2017 (Originally 04/28/1941)  .  DEXA SCAN  04/01/2017 (Originally 04/27/1998)  . PNA vac Low Risk Adult (1 of 2 - PCV13) 04/01/2017 (Originally 04/27/1998)  . INFLUENZA VACCINE  05/20/2016  . HEMOGLOBIN A1C  09/17/2016   No flowsheet data found. Functional Status Survey:    Filed Vitals:   04/18/16 1254  BP: 120/77  Pulse: 84  Temp: 97.4 F (36.3 C)  TempSrc: Oral  Resp: 18  Height: 5\' 4"  (1.626 m)  Weight: 165 lb 1.6 oz (74.889 kg)   Body mass index is 28.33 kg/(m^2). Physical Exam   In general this is a pleasant elderly female in no distress sitting comfortably in her wheelchair.  Her skin is warm and dry.  Eyes she has prescription lenses visual acuity appears grossly intact.  Oropharynx clear mucous membranes moist.  Chest is clear to auscultation there is no labored breathing.  Heart is largely regular rate and rhythm with occasional irregular beats she has always say 1-2 plus lower extremity edema bilaterally compression hose are on.  Abdomen is soft nontender positive bowel sounds.  Musculoskeletal is able to move all extremities 4 still has some lower extremity weakness is ambulating in a wheelchair today.  Neurologic is grossly intact no lateralizing findings her speech is clear.  Psych she is alert and oriented pleasant and appropriate  Labs reviewed:  Recent Labs  03/14/16 0518  03/20/16 0454 04/07/16 0530 04/18/16 0740  NA 138  < > 135 137 139  K 3.4*  < > 3.8 3.9 3.8  CL 81*  < > 86* 104 104  CO2 49*  < > 42* 30 28  GLUCOSE 230*  < > 210* 120* 111*  BUN 36*  < > 48* 19 18  CREATININE 0.75  < > 0.67 0.61 0.80  CALCIUM 8.3*  < > 8.4* 7.9* 8.6*  MG 1.9  --   --   --   --   < > = values in this interval not displayed.  Recent Labs  03/10/16 0625  AST 16  ALT 16  ALKPHOS 42  BILITOT 0.7  PROT 5.8*  ALBUMIN 3.4*    Recent Labs  02/27/16 1220  04/07/16 0530 04/14/16 0600 04/18/16 0740  WBC 5.0  < > 3.4* 5.5 5.8  NEUTROABS 3750  --   --  2.5  --   HGB 12.9  < > 9.5*  9.1* 9.7*  HCT 42.3  < > 28.5* 30.0* 30.0*  MCV 95.7  < > 94.7 96.2 93.2  PLT 167  < > 127* 245 194  < > = values in this interval not displayed. No results found for: TSH Lab Results  Component Value Date   HGBA1C 6.4* 03/17/2016   No results found for: CHOL, HDL, LDLCALC, LDLDIRECT, TRIG, CHOLHDL  Significant Diagnostic Results in last 30 days:  Dg Chest Port 1 View  03/20/2016  CLINICAL DATA:  Respiratory failure. EXAM: PORTABLE CHEST 1 VIEW COMPARISON:  03/18/2016. FINDINGS: Mediastinum hilar structures are unremarkable. Cardiomegaly with progressive bilateral pulmonary interstitial prominence suggesting congestive heart failure. Persistent low lung volumes with left lower lobe atelectasis and/or consolidation. Bilateral pleural effusions left side greater than right again noted. No pneumothorax. IMPRESSION: 1. Cardiomegaly with progressive diffuse bilateral from interstitial prominence consistent with congestive heart failure. Bilateral pleural effusions again noted, left side greater than right. 2.  Left lower lobe atelectasis and/or consolidation. Electronically Signed   By: Maisie Fus  Register   On: 03/20/2016 07:05    Assessment/Plan 1 history of acute on chronic respiratory failure this has been stable during her stay here she will need BiPAP at home continues to be oxygen dependent-also will need PT and OT for strengthening as well as nursing support for multiple medical issues. She also continues on nebulizers 3 times a day routine  #2-history of diastolic CHF-this is been stable as well she is on Lasix 40 mg in the morning and 20 in the evening-renal function appears to be stable per recent labs will need follow-up by primary care provider as well as cardiology.  #3 history coronary artery disease-this has been asymptomatic essentially during her stay here she does continue on Coreg as well as a statin Lipitor again will need follow-up by cardiology.  #4-history of atrial  fibrillation this appears rate controlled she is on digoxin as well as Eliquis for rate control  -she is also on Coreg for rate control.  #5 anemia this appears to have stabilized a call up blood testing has been negative she has been started on B12 for borderline low level she is also on iron most recent hemoglobin 9.7 on lab done today follow-up with primary care provider as needed.  #6 history diabetes type 2 this appears to be diet controlled she has when necessary Lantus at night for blood sugar above 200 but this is rarely needed we'll discontinue this prior to  discharge--   Again patient will need continued PT and OT for strengthening as well as nursing to weigh wait and treat her multiple medical issues she will need BiPAP she is on oxygen as well.  [id number] note greater than 30 minutes spent on this discharge summary-greater than 50% of time spent coordinating plan of care for numerous diagnoses    London Sheer, CMA [phone]

## 2016-04-21 NOTE — Progress Notes (Signed)
Quick Note:  Note was forwarded to Tana CoastLeslie Lewis, PA [facility]. ______

## 2016-04-21 NOTE — Progress Notes (Signed)
Quick Note:  Called and LMOM for a return call from [place]risti. Pt is on the schedule for OV on 05/02/2016 at 9:00 Am with Tana CoastLeslie Lewis, PA.  [facility]. ______

## 2016-04-21 NOTE — Progress Notes (Signed)
Quick Note:  Silva BandyKristi returned my call and said her mom is going home tomorrow. She is not currently on a fiber supplement and Silva BandyKristi is aware for her to start Benefiber 2 tsp bid. Pt is only having about 2 stools daily now. OV is scheduled with Tana CoastLeslie Lewis, PA on 05/02/2016 at 9:00 Am. ______

## 2016-04-25 ENCOUNTER — Other Ambulatory Visit: Payer: Self-pay | Admitting: Internal Medicine

## 2016-04-25 DIAGNOSIS — J438 Other emphysema: Secondary | ICD-10-CM

## 2016-04-25 DIAGNOSIS — J9621 Acute and chronic respiratory failure with hypoxia: Secondary | ICD-10-CM

## 2016-04-25 LAB — BLOOD GAS, ARTERIAL
ACID-BASE EXCESS: 4.1 mmol/L — AB (ref 0.0–2.0)
BICARBONATE: 27.8 meq/L — AB (ref 20.0–24.0)
Drawn by: 277331
O2 Content: 2.5 L/min
O2 Saturation: 98.2 %
PH ART: 7.419 (ref 7.350–7.450)
PO2 ART: 106 mmHg — AB (ref 80.0–100.0)
Patient temperature: 37
pCO2 arterial: 44.7 mmHg (ref 35.0–45.0)

## 2016-04-25 NOTE — Progress Notes (Signed)
**Note De-Identified  Obfuscation** [facility] for the attention of Harlene RamusDella Belton LPN

## 2016-04-26 DIAGNOSIS — E119 Type 2 diabetes mellitus without complications: Secondary | ICD-10-CM | POA: Diagnosis not present

## 2016-04-26 DIAGNOSIS — Z9981 Dependence on supplemental oxygen: Secondary | ICD-10-CM | POA: Diagnosis not present

## 2016-04-26 DIAGNOSIS — J441 Chronic obstructive pulmonary disease with (acute) exacerbation: Secondary | ICD-10-CM | POA: Diagnosis not present

## 2016-04-26 DIAGNOSIS — I2510 Atherosclerotic heart disease of native coronary artery without angina pectoris: Secondary | ICD-10-CM | POA: Diagnosis not present

## 2016-04-26 DIAGNOSIS — I252 Old myocardial infarction: Secondary | ICD-10-CM | POA: Diagnosis not present

## 2016-04-26 DIAGNOSIS — I4891 Unspecified atrial fibrillation: Secondary | ICD-10-CM | POA: Diagnosis not present

## 2016-04-26 DIAGNOSIS — F17211 Nicotine dependence, cigarettes, in remission: Secondary | ICD-10-CM | POA: Diagnosis not present

## 2016-04-26 DIAGNOSIS — I509 Heart failure, unspecified: Secondary | ICD-10-CM | POA: Diagnosis not present

## 2016-04-26 DIAGNOSIS — J449 Chronic obstructive pulmonary disease, unspecified: Secondary | ICD-10-CM | POA: Diagnosis not present

## 2016-04-26 DIAGNOSIS — E785 Hyperlipidemia, unspecified: Secondary | ICD-10-CM | POA: Diagnosis not present

## 2016-04-28 DIAGNOSIS — J449 Chronic obstructive pulmonary disease, unspecified: Secondary | ICD-10-CM | POA: Diagnosis not present

## 2016-04-28 DIAGNOSIS — I2510 Atherosclerotic heart disease of native coronary artery without angina pectoris: Secondary | ICD-10-CM | POA: Diagnosis not present

## 2016-04-28 DIAGNOSIS — E119 Type 2 diabetes mellitus without complications: Secondary | ICD-10-CM | POA: Diagnosis not present

## 2016-04-28 DIAGNOSIS — I4891 Unspecified atrial fibrillation: Secondary | ICD-10-CM | POA: Diagnosis not present

## 2016-04-28 DIAGNOSIS — I509 Heart failure, unspecified: Secondary | ICD-10-CM | POA: Diagnosis not present

## 2016-04-28 DIAGNOSIS — J441 Chronic obstructive pulmonary disease with (acute) exacerbation: Secondary | ICD-10-CM | POA: Diagnosis not present

## 2016-04-29 DIAGNOSIS — J449 Chronic obstructive pulmonary disease, unspecified: Secondary | ICD-10-CM | POA: Diagnosis not present

## 2016-04-29 DIAGNOSIS — I2510 Atherosclerotic heart disease of native coronary artery without angina pectoris: Secondary | ICD-10-CM | POA: Diagnosis not present

## 2016-04-29 DIAGNOSIS — J441 Chronic obstructive pulmonary disease with (acute) exacerbation: Secondary | ICD-10-CM | POA: Diagnosis not present

## 2016-04-29 DIAGNOSIS — I4891 Unspecified atrial fibrillation: Secondary | ICD-10-CM | POA: Diagnosis not present

## 2016-04-29 DIAGNOSIS — I509 Heart failure, unspecified: Secondary | ICD-10-CM | POA: Diagnosis not present

## 2016-04-29 DIAGNOSIS — E119 Type 2 diabetes mellitus without complications: Secondary | ICD-10-CM | POA: Diagnosis not present

## 2016-05-01 DIAGNOSIS — J441 Chronic obstructive pulmonary disease with (acute) exacerbation: Secondary | ICD-10-CM | POA: Diagnosis not present

## 2016-05-01 DIAGNOSIS — I2510 Atherosclerotic heart disease of native coronary artery without angina pectoris: Secondary | ICD-10-CM | POA: Diagnosis not present

## 2016-05-01 DIAGNOSIS — I4891 Unspecified atrial fibrillation: Secondary | ICD-10-CM | POA: Diagnosis not present

## 2016-05-01 DIAGNOSIS — E119 Type 2 diabetes mellitus without complications: Secondary | ICD-10-CM | POA: Diagnosis not present

## 2016-05-01 DIAGNOSIS — I509 Heart failure, unspecified: Secondary | ICD-10-CM | POA: Diagnosis not present

## 2016-05-01 DIAGNOSIS — J449 Chronic obstructive pulmonary disease, unspecified: Secondary | ICD-10-CM | POA: Diagnosis not present

## 2016-05-02 ENCOUNTER — Ambulatory Visit: Payer: Medicare Other | Admitting: Gastroenterology

## 2016-05-02 DIAGNOSIS — F339 Major depressive disorder, recurrent, unspecified: Secondary | ICD-10-CM | POA: Diagnosis not present

## 2016-05-02 DIAGNOSIS — I10 Essential (primary) hypertension: Secondary | ICD-10-CM | POA: Diagnosis not present

## 2016-05-02 DIAGNOSIS — I959 Hypotension, unspecified: Secondary | ICD-10-CM | POA: Diagnosis not present

## 2016-05-05 DIAGNOSIS — I4891 Unspecified atrial fibrillation: Secondary | ICD-10-CM | POA: Diagnosis not present

## 2016-05-05 DIAGNOSIS — I509 Heart failure, unspecified: Secondary | ICD-10-CM | POA: Diagnosis not present

## 2016-05-05 DIAGNOSIS — J449 Chronic obstructive pulmonary disease, unspecified: Secondary | ICD-10-CM | POA: Diagnosis not present

## 2016-05-05 DIAGNOSIS — J441 Chronic obstructive pulmonary disease with (acute) exacerbation: Secondary | ICD-10-CM | POA: Diagnosis not present

## 2016-05-05 DIAGNOSIS — E119 Type 2 diabetes mellitus without complications: Secondary | ICD-10-CM | POA: Diagnosis not present

## 2016-05-05 DIAGNOSIS — I2510 Atherosclerotic heart disease of native coronary artery without angina pectoris: Secondary | ICD-10-CM | POA: Diagnosis not present

## 2016-05-06 DIAGNOSIS — E119 Type 2 diabetes mellitus without complications: Secondary | ICD-10-CM | POA: Diagnosis not present

## 2016-05-06 DIAGNOSIS — J449 Chronic obstructive pulmonary disease, unspecified: Secondary | ICD-10-CM | POA: Diagnosis not present

## 2016-05-06 DIAGNOSIS — I4891 Unspecified atrial fibrillation: Secondary | ICD-10-CM | POA: Diagnosis not present

## 2016-05-06 DIAGNOSIS — I509 Heart failure, unspecified: Secondary | ICD-10-CM | POA: Diagnosis not present

## 2016-05-06 DIAGNOSIS — I2510 Atherosclerotic heart disease of native coronary artery without angina pectoris: Secondary | ICD-10-CM | POA: Diagnosis not present

## 2016-05-06 DIAGNOSIS — J441 Chronic obstructive pulmonary disease with (acute) exacerbation: Secondary | ICD-10-CM | POA: Diagnosis not present

## 2016-05-07 DIAGNOSIS — J441 Chronic obstructive pulmonary disease with (acute) exacerbation: Secondary | ICD-10-CM | POA: Diagnosis not present

## 2016-05-07 DIAGNOSIS — I509 Heart failure, unspecified: Secondary | ICD-10-CM | POA: Diagnosis not present

## 2016-05-07 DIAGNOSIS — E119 Type 2 diabetes mellitus without complications: Secondary | ICD-10-CM | POA: Diagnosis not present

## 2016-05-07 DIAGNOSIS — I2510 Atherosclerotic heart disease of native coronary artery without angina pectoris: Secondary | ICD-10-CM | POA: Diagnosis not present

## 2016-05-07 DIAGNOSIS — I4891 Unspecified atrial fibrillation: Secondary | ICD-10-CM | POA: Diagnosis not present

## 2016-05-07 DIAGNOSIS — J449 Chronic obstructive pulmonary disease, unspecified: Secondary | ICD-10-CM | POA: Diagnosis not present

## 2016-05-08 DIAGNOSIS — I4891 Unspecified atrial fibrillation: Secondary | ICD-10-CM | POA: Diagnosis not present

## 2016-05-08 DIAGNOSIS — I2510 Atherosclerotic heart disease of native coronary artery without angina pectoris: Secondary | ICD-10-CM | POA: Diagnosis not present

## 2016-05-08 DIAGNOSIS — J449 Chronic obstructive pulmonary disease, unspecified: Secondary | ICD-10-CM | POA: Diagnosis not present

## 2016-05-08 DIAGNOSIS — I509 Heart failure, unspecified: Secondary | ICD-10-CM | POA: Diagnosis not present

## 2016-05-08 DIAGNOSIS — E119 Type 2 diabetes mellitus without complications: Secondary | ICD-10-CM | POA: Diagnosis not present

## 2016-05-08 DIAGNOSIS — J441 Chronic obstructive pulmonary disease with (acute) exacerbation: Secondary | ICD-10-CM | POA: Diagnosis not present

## 2016-05-09 DIAGNOSIS — D509 Iron deficiency anemia, unspecified: Secondary | ICD-10-CM | POA: Diagnosis not present

## 2016-05-09 DIAGNOSIS — J9611 Chronic respiratory failure with hypoxia: Secondary | ICD-10-CM | POA: Diagnosis not present

## 2016-05-09 DIAGNOSIS — J449 Chronic obstructive pulmonary disease, unspecified: Secondary | ICD-10-CM | POA: Diagnosis not present

## 2016-05-13 DIAGNOSIS — I2510 Atherosclerotic heart disease of native coronary artery without angina pectoris: Secondary | ICD-10-CM | POA: Diagnosis not present

## 2016-05-13 DIAGNOSIS — I4891 Unspecified atrial fibrillation: Secondary | ICD-10-CM | POA: Diagnosis not present

## 2016-05-13 DIAGNOSIS — J449 Chronic obstructive pulmonary disease, unspecified: Secondary | ICD-10-CM | POA: Diagnosis not present

## 2016-05-13 DIAGNOSIS — J441 Chronic obstructive pulmonary disease with (acute) exacerbation: Secondary | ICD-10-CM | POA: Diagnosis not present

## 2016-05-13 DIAGNOSIS — E119 Type 2 diabetes mellitus without complications: Secondary | ICD-10-CM | POA: Diagnosis not present

## 2016-05-13 DIAGNOSIS — I509 Heart failure, unspecified: Secondary | ICD-10-CM | POA: Diagnosis not present

## 2016-05-14 DIAGNOSIS — J441 Chronic obstructive pulmonary disease with (acute) exacerbation: Secondary | ICD-10-CM | POA: Diagnosis not present

## 2016-05-14 DIAGNOSIS — J449 Chronic obstructive pulmonary disease, unspecified: Secondary | ICD-10-CM | POA: Diagnosis not present

## 2016-05-14 DIAGNOSIS — I2510 Atherosclerotic heart disease of native coronary artery without angina pectoris: Secondary | ICD-10-CM | POA: Diagnosis not present

## 2016-05-14 DIAGNOSIS — I4891 Unspecified atrial fibrillation: Secondary | ICD-10-CM | POA: Diagnosis not present

## 2016-05-14 DIAGNOSIS — E119 Type 2 diabetes mellitus without complications: Secondary | ICD-10-CM | POA: Diagnosis not present

## 2016-05-14 DIAGNOSIS — I509 Heart failure, unspecified: Secondary | ICD-10-CM | POA: Diagnosis not present

## 2016-05-15 DIAGNOSIS — I4891 Unspecified atrial fibrillation: Secondary | ICD-10-CM | POA: Diagnosis not present

## 2016-05-15 DIAGNOSIS — J441 Chronic obstructive pulmonary disease with (acute) exacerbation: Secondary | ICD-10-CM | POA: Diagnosis not present

## 2016-05-15 DIAGNOSIS — J449 Chronic obstructive pulmonary disease, unspecified: Secondary | ICD-10-CM | POA: Diagnosis not present

## 2016-05-15 DIAGNOSIS — I509 Heart failure, unspecified: Secondary | ICD-10-CM | POA: Diagnosis not present

## 2016-05-15 DIAGNOSIS — E119 Type 2 diabetes mellitus without complications: Secondary | ICD-10-CM | POA: Diagnosis not present

## 2016-05-15 DIAGNOSIS — I2510 Atherosclerotic heart disease of native coronary artery without angina pectoris: Secondary | ICD-10-CM | POA: Diagnosis not present

## 2016-05-16 DIAGNOSIS — I2510 Atherosclerotic heart disease of native coronary artery without angina pectoris: Secondary | ICD-10-CM | POA: Diagnosis not present

## 2016-05-16 DIAGNOSIS — E119 Type 2 diabetes mellitus without complications: Secondary | ICD-10-CM | POA: Diagnosis not present

## 2016-05-16 DIAGNOSIS — J441 Chronic obstructive pulmonary disease with (acute) exacerbation: Secondary | ICD-10-CM | POA: Diagnosis not present

## 2016-05-16 DIAGNOSIS — J449 Chronic obstructive pulmonary disease, unspecified: Secondary | ICD-10-CM | POA: Diagnosis not present

## 2016-05-16 DIAGNOSIS — I4891 Unspecified atrial fibrillation: Secondary | ICD-10-CM | POA: Diagnosis not present

## 2016-05-16 DIAGNOSIS — I509 Heart failure, unspecified: Secondary | ICD-10-CM | POA: Diagnosis not present

## 2016-05-19 DIAGNOSIS — J441 Chronic obstructive pulmonary disease with (acute) exacerbation: Secondary | ICD-10-CM | POA: Diagnosis not present

## 2016-05-19 DIAGNOSIS — I2510 Atherosclerotic heart disease of native coronary artery without angina pectoris: Secondary | ICD-10-CM | POA: Diagnosis not present

## 2016-05-19 DIAGNOSIS — I509 Heart failure, unspecified: Secondary | ICD-10-CM | POA: Diagnosis not present

## 2016-05-19 DIAGNOSIS — I4891 Unspecified atrial fibrillation: Secondary | ICD-10-CM | POA: Diagnosis not present

## 2016-05-19 DIAGNOSIS — J449 Chronic obstructive pulmonary disease, unspecified: Secondary | ICD-10-CM | POA: Diagnosis not present

## 2016-05-19 DIAGNOSIS — E119 Type 2 diabetes mellitus without complications: Secondary | ICD-10-CM | POA: Diagnosis not present

## 2016-05-20 ENCOUNTER — Telehealth: Payer: Self-pay | Admitting: Cardiology

## 2016-05-20 DIAGNOSIS — E119 Type 2 diabetes mellitus without complications: Secondary | ICD-10-CM | POA: Diagnosis not present

## 2016-05-20 DIAGNOSIS — J449 Chronic obstructive pulmonary disease, unspecified: Secondary | ICD-10-CM | POA: Diagnosis not present

## 2016-05-20 DIAGNOSIS — I2510 Atherosclerotic heart disease of native coronary artery without angina pectoris: Secondary | ICD-10-CM | POA: Diagnosis not present

## 2016-05-20 DIAGNOSIS — J441 Chronic obstructive pulmonary disease with (acute) exacerbation: Secondary | ICD-10-CM | POA: Diagnosis not present

## 2016-05-20 DIAGNOSIS — I4891 Unspecified atrial fibrillation: Secondary | ICD-10-CM | POA: Diagnosis not present

## 2016-05-20 DIAGNOSIS — I509 Heart failure, unspecified: Secondary | ICD-10-CM | POA: Diagnosis not present

## 2016-05-20 NOTE — Telephone Encounter (Signed)
Pt informed that med was stopped on hospital discharge in June

## 2016-05-20 NOTE — Telephone Encounter (Signed)
Needs RX for Diltizaem 90 day supply sent to [facility] pharmacy.  tg

## 2016-05-23 DIAGNOSIS — I2510 Atherosclerotic heart disease of native coronary artery without angina pectoris: Secondary | ICD-10-CM | POA: Diagnosis not present

## 2016-05-23 DIAGNOSIS — I509 Heart failure, unspecified: Secondary | ICD-10-CM | POA: Diagnosis not present

## 2016-05-23 DIAGNOSIS — E119 Type 2 diabetes mellitus without complications: Secondary | ICD-10-CM | POA: Diagnosis not present

## 2016-05-23 DIAGNOSIS — J441 Chronic obstructive pulmonary disease with (acute) exacerbation: Secondary | ICD-10-CM | POA: Diagnosis not present

## 2016-05-23 DIAGNOSIS — J449 Chronic obstructive pulmonary disease, unspecified: Secondary | ICD-10-CM | POA: Diagnosis not present

## 2016-05-23 DIAGNOSIS — I4891 Unspecified atrial fibrillation: Secondary | ICD-10-CM | POA: Diagnosis not present

## 2016-05-26 DIAGNOSIS — I509 Heart failure, unspecified: Secondary | ICD-10-CM | POA: Diagnosis not present

## 2016-05-26 DIAGNOSIS — J441 Chronic obstructive pulmonary disease with (acute) exacerbation: Secondary | ICD-10-CM | POA: Diagnosis not present

## 2016-05-26 DIAGNOSIS — E119 Type 2 diabetes mellitus without complications: Secondary | ICD-10-CM | POA: Diagnosis not present

## 2016-05-26 DIAGNOSIS — I4891 Unspecified atrial fibrillation: Secondary | ICD-10-CM | POA: Diagnosis not present

## 2016-05-26 DIAGNOSIS — I2510 Atherosclerotic heart disease of native coronary artery without angina pectoris: Secondary | ICD-10-CM | POA: Diagnosis not present

## 2016-05-26 DIAGNOSIS — J449 Chronic obstructive pulmonary disease, unspecified: Secondary | ICD-10-CM | POA: Diagnosis not present

## 2016-05-28 DIAGNOSIS — I2510 Atherosclerotic heart disease of native coronary artery without angina pectoris: Secondary | ICD-10-CM | POA: Diagnosis not present

## 2016-05-28 DIAGNOSIS — J449 Chronic obstructive pulmonary disease, unspecified: Secondary | ICD-10-CM | POA: Diagnosis not present

## 2016-05-28 DIAGNOSIS — I509 Heart failure, unspecified: Secondary | ICD-10-CM | POA: Diagnosis not present

## 2016-05-28 DIAGNOSIS — I4891 Unspecified atrial fibrillation: Secondary | ICD-10-CM | POA: Diagnosis not present

## 2016-05-28 DIAGNOSIS — E119 Type 2 diabetes mellitus without complications: Secondary | ICD-10-CM | POA: Diagnosis not present

## 2016-05-28 DIAGNOSIS — J441 Chronic obstructive pulmonary disease with (acute) exacerbation: Secondary | ICD-10-CM | POA: Diagnosis not present

## 2016-06-04 ENCOUNTER — Ambulatory Visit (INDEPENDENT_AMBULATORY_CARE_PROVIDER_SITE_OTHER): Payer: Medicare Other | Admitting: Gastroenterology

## 2016-06-04 ENCOUNTER — Encounter: Payer: Self-pay | Admitting: Gastroenterology

## 2016-06-04 VITALS — BP 116/61 | HR 50 | Temp 96.7°F | Ht 64.0 in | Wt 149.8 lb

## 2016-06-04 DIAGNOSIS — I6522 Occlusion and stenosis of left carotid artery: Secondary | ICD-10-CM | POA: Diagnosis not present

## 2016-06-04 DIAGNOSIS — R197 Diarrhea, unspecified: Secondary | ICD-10-CM

## 2016-06-04 NOTE — Progress Notes (Signed)
Primary Care Physician: Dwana MelenaZack Hall, MD  Primary Gastroenterologist:  Kathy SessionsMichael Rourk, MD   Chief Complaint  Patient presents with  . Follow-up    loose stool    HPI: [name] is a 82 y.o. female here For follow-up. She was seen back in June at the request of the nursing home attending for further evaluation of diarrhea. 2 months of intermittent diarrhea. Stool studies were negative. Started on Benefiber. For anemia she had multiple Hemoccults including I FOBT which were all negative. She is back at home now. She feels much better. One BM daily sometimes loose and urgent. Takes imodium if going out. No melena, brbpr. Appetite is good. Continues losing weight but thinks it is related to her diuretics. No significant upper GI symptoms. No melena rectal bleeding.  Current Outpatient Prescriptions  Medication Sig Dispense Refill  . acetaminophen (TYLENOL) 325 MG tablet Take two tablets by mouth every 6 hours as needed for pain. Do not exceed 3000mg  of APAP from all sources within a 24 hours period    . apixaban (ELIQUIS) 5 MG TABS tablet Take 1 tablet (5 mg total) by mouth 2 (two) times daily. 60 tablet 0  . Balsam [place]-Castor Oil (VENELEX) OINT Apply to sacrum q shift & prn    . Calcium 500 MG CHEW Take 1000 mg by mouth once a day    . carvedilol (COREG) 12.5 MG tablet Take 1 tablet (12.5 mg total) by mouth 2 (two) times daily with a meal.    . digoxin (LANOXIN) 0.125 MG tablet Take 0.125 mg by mouth daily. Hold for AP less than 60    . feeding supplement, GLUCERNA SHAKE, (GLUCERNA SHAKE) LIQD Take 237 mLs by mouth every evening.    . furosemide (LASIX) 20 MG tablet Take 20 mg by mouth daily. Once an evening at 4:00 pm    . furosemide (LASIX) 40 MG tablet Take 40 mg by mouth daily. Daily at 0800 am    . ipratropium-albuterol (DUONEB) 0.5-2.5 (3) MG/3ML SOLN Take 3 mLs by nebulization 3 (three) times daily. 360 mL   . LORazepam (ATIVAN) 0.5 MG tablet Take 1 tablet (0.5 mg total)  by mouth daily. Take one tablet by mouth once daily 30 tablet 0  . Multiple Vitamins-Minerals (CENTROVITE) TABS Tablet; 18-400 mg-mcg  1 tablet by mouth once daily    . nitroGLYCERIN (NITROSTAT) 0.4 MG SL tablet Place 1 tablet (0.4 mg total) under the tongue every 5 (five) minutes as needed for chest pain. 25 tablet 3  . NON FORMULARY Pt on 2.5 liters of O2 daily    . NON FORMULARY Bipap qhs with IPAP of 20 and EPAP of 5. Tidal volume of 600 and respiratory rate of 16. Twice a day    . omeprazole (PRILOSEC) 20 MG capsule Take 20 mg by mouth at bedtime.     Marland Kitchen. POTASSIUM CHLORIDE ER PO Take by mouth.    . Probiotic Product (RISA-BID PROBIOTIC PO) 1 billion-250 cell-mg ; by mouth twice a day    . vitamin B-12 (CYANOCOBALAMIN) 1000 MCG tablet Take 1,000 mcg by mouth daily.     No current facility-administered medications for this visit.     Allergies as of 06/04/2016 - Review Complete 06/04/2016  Allergen Reaction Noted  . Codeine Nausea Only 01/14/2011  . Heparin Other (See Comments) 03/09/2016    ROS:  General: Negative for anorexia, weight loss, fever, chills, fatigue, weakness. ENT: Negative for hoarseness, difficulty swallowing , nasal  congestion. CV: Negative for chest pain, angina, palpitations, dyspnea on exertion, peripheral edema.  Respiratory: Negative for dyspnea at rest, dyspnea on exertion, cough, sputum, wheezing.  GI: See history of present illness. GU:  Negative for dysuria, hematuria, urinary incontinence, urinary frequency, nocturnal urination.  Endo: Negative for unusual weight change.    Physical Examination:   BP 116/61   Pulse (!) 50   Temp (!) 96.7 F (35.9 C) (Oral)   Ht 5\' 4"  (1.626 m)   Wt 149 lb 12.8 oz (67.9 kg)   BMI 25.71 kg/m   General: Well-nourished, well-developed in no acute distress.  Eyes: No icterus. Mouth: Oropharyngeal mucosa moist and pink , no lesions erythema or exudate. Lungs: Clear to auscultation bilaterally.  Heart: Regular rate  and rhythm, no murmurs rubs or gallops.  Abdomen: Bowel sounds are normal, nontender, nondistended, no hepatosplenomegaly or masses, no abdominal bruits or hernia , no rebound or guarding.   Extremities: No lower extremity edema. No clubbing or deformities. Neuro: Alert and oriented x 4   Skin: Warm and dry, no jaundice.   Psych: Alert and cooperative, normal mood and affect.  Labs:  Lab Results  Component Value Date   CREATININE 0.80 04/18/2016   BUN 18 04/18/2016   NA 139 04/18/2016   K 3.8 04/18/2016   CL 104 04/18/2016   CO2 28 04/18/2016   Lab Results  Component Value Date   WBC 5.8 04/18/2016   HGB 9.7 (L) 04/18/2016   HCT 30.0 (L) 04/18/2016   MCV 93.2 04/18/2016   PLT 194 04/18/2016   Lab Results  Component Value Date   IRON 28 04/07/2016   TIBC 335 04/07/2016   FERRITIN 20 04/07/2016    Imaging Studies: No results found.

## 2016-06-04 NOTE — Patient Instructions (Signed)
1. Please continue to monitor your weight. If you lose more than 10 more pounds, please let us know. 2. Call with any questions or concerns.

## 2016-06-04 NOTE — Assessment & Plan Note (Signed)
Overall much improved. Daily BM, sometimes loose. Previous stool studies unremarkable. Multiple Hemoccults were negative. Patient not interested in any further workup. Plan on office visit as needed.

## 2016-06-05 NOTE — Progress Notes (Signed)
cc'ed to pcp °

## 2016-06-12 ENCOUNTER — Other Ambulatory Visit (INDEPENDENT_AMBULATORY_CARE_PROVIDER_SITE_OTHER): Payer: Medicare Other

## 2016-06-12 ENCOUNTER — Encounter: Payer: Self-pay | Admitting: Internal Medicine

## 2016-06-12 ENCOUNTER — Ambulatory Visit (INDEPENDENT_AMBULATORY_CARE_PROVIDER_SITE_OTHER)
Admission: RE | Admit: 2016-06-12 | Discharge: 2016-06-12 | Disposition: A | Discharge status: Home / Self Care | Payer: Medicare Other | Source: Ambulatory Visit | Attending: Internal Medicine | Admitting: Internal Medicine

## 2016-06-12 ENCOUNTER — Ambulatory Visit (INDEPENDENT_AMBULATORY_CARE_PROVIDER_SITE_OTHER): Payer: Medicare Other | Admitting: Internal Medicine

## 2016-06-12 VITALS — BP 112/70 | HR 83 | Ht 64.0 in | Wt 149.0 lb

## 2016-06-12 DIAGNOSIS — I6522 Occlusion and stenosis of left carotid artery: Secondary | ICD-10-CM

## 2016-06-12 DIAGNOSIS — R0600 Dyspnea, unspecified: Secondary | ICD-10-CM

## 2016-06-12 DIAGNOSIS — J438 Other emphysema: Secondary | ICD-10-CM

## 2016-06-12 DIAGNOSIS — I10 Essential (primary) hypertension: Secondary | ICD-10-CM

## 2016-06-12 DIAGNOSIS — J9612 Chronic respiratory failure with hypercapnia: Secondary | ICD-10-CM

## 2016-06-12 DIAGNOSIS — J449 Chronic obstructive pulmonary disease, unspecified: Secondary | ICD-10-CM | POA: Diagnosis not present

## 2016-06-12 DIAGNOSIS — J9611 Chronic respiratory failure with hypoxia: Secondary | ICD-10-CM | POA: Diagnosis not present

## 2016-06-12 LAB — CBC WITH DIFFERENTIAL/PLATELET
BASOS ABS: 0 10*3/uL (ref 0.0–0.1)
Basophils Relative: 0.3 % (ref 0.0–3.0)
EOS PCT: 1.5 % (ref 0.0–5.0)
Eosinophils Absolute: 0.1 10*3/uL (ref 0.0–0.7)
HCT: 35.7 % — ABNORMAL LOW (ref 36.0–46.0)
HEMOGLOBIN: 11.9 g/dL — AB (ref 12.0–15.0)
LYMPHS ABS: 1.7 10*3/uL (ref 0.7–4.0)
Lymphocytes Relative: 22.6 % (ref 12.0–46.0)
MCHC: 33.3 g/dL (ref 30.0–36.0)
MCV: 85.6 fl (ref 78.0–100.0)
MONO ABS: 0.7 10*3/uL (ref 0.1–1.0)
Monocytes Relative: 8.9 % (ref 3.0–12.0)
NEUTROS PCT: 66.7 % (ref 43.0–77.0)
Neutro Abs: 5.1 10*3/uL (ref 1.4–7.7)
Platelets: 201 10*3/uL (ref 150.0–400.0)
RBC: 4.17 Mil/uL (ref 3.87–5.11)
RDW: 14.5 % (ref 11.5–15.5)
WBC: 7.6 10*3/uL (ref 4.0–10.5)

## 2016-06-12 LAB — BASIC METABOLIC PANEL
BUN: 18 mg/dL (ref 6–23)
CHLORIDE: 95 meq/L — AB (ref 96–112)
CO2: 36 mEq/L — ABNORMAL HIGH (ref 19–32)
CREATININE: 0.97 mg/dL (ref 0.40–1.20)
Calcium: 9.5 mg/dL (ref 8.4–10.5)
GFR: 58.27 mL/min — AB (ref >60.00)
Glucose, Bld: 152 mg/dL — ABNORMAL HIGH (ref 70–99)
POTASSIUM: 4.4 meq/L (ref 3.5–5.1)
Sodium: 139 mEq/L (ref 135–145)

## 2016-06-12 LAB — BRAIN NATRIURETIC PEPTIDE: PRO B NATRI PEPTIDE: 358 pg/mL — AB (ref 0.0–100.0)

## 2016-06-12 LAB — TSH: TSH: 2.65 u[IU]/mL (ref 0.35–4.50)

## 2016-06-12 MED ORDER — BISOPROLOL FUMARATE 5 MG PO TABS: 5.0000 mg | ORAL_TABLET | Freq: Every day | ORAL | 2 refills | Status: DC

## 2016-06-12 MED ORDER — OMEPRAZOLE 20 MG PO CPDR: 20.0000 mg | DELAYED_RELEASE_CAPSULE | Freq: Two times a day (BID) | ORAL | 11 refills | Status: DC

## 2016-06-12 NOTE — Progress Notes (Signed)
ATC, NA and no option given to leave msg

## 2016-06-12 NOTE — Progress Notes (Signed)
ATC, NA and no option to leave msg, WCB

## 2016-06-12 NOTE — Progress Notes (Signed)
Subjective:     Patient ID: Kathy CuriaVirginia L Larusso, female   DOB: 07/10/1931,     MRN: [id number]  HPI   [id number] yowf quit smoking 1999 with dx of copd/ 02 dep chronically referred to pulmonary clinic 06/12/2016 by Dr Catalina PizzaZach Hall sp admit:    Admit date: 03/09/2016 Discharge date: 03/20/2016  Recommendations for Outpatient Follow-up:  1. Follow up with PCP in 1-2 weeks.  2. Follow up with cardiology in 2 weeks.  3. Patient will need BiPAP QHS with IPAP of 20 and EPAP of 5. Tidal volume of 600 and respiratory rate of 16.   Discharge Diagnoses:  Principal Problem:   Acute on chronic respiratory failure with hypoxia (HCC)   Type 2 diabetes mellitus (HCC)   COPD (chronic obstructive pulmonary disease) (HCC)   Pleural effusion on left   Atrial fibrillation (HCC)   Status post thoracentesis   Pleural effusion, bilateral   Atrial fibrillation with RVR (HCC)   Confusion   SOB (shortness of breath)   Mental confusion   Palliative care encounter   Goals of care, counseling/discussion   History of present illness:  1980 yof with history of afib and CHADS score of 5 on ASA and no anticoagulation due to history of bleeding, CAD with stenting of the RCA and history of inferior wall MI in 1999, HTN, DM Type 2, HLD. She presented with complaints of weakness and fatigue, which was found to be acute CHF with bilateral pleural effusion. She was referred for admission.   Hospital Course:  Patient was admitted for acute on chronic respiratory failure with hypoxia due to bilateral pleural effusions/decompensated CHF and possible COPD exacebtion. S/p left-sided thoracentesis with 1 L transudative fluid removal on 03/10/2016. Patient was diuresed with IV lasix and volume status -11L. Discharge weight 74.5kg. She was seen by cardiology, who adjusted he medications and have transitioned her to oral lasix, She will need close follow up with cardiology in the next two weeks. Patient also developed hypercarbic failure,  requiring transfer to SDU and bipap support on 5/23. This was felt related to CHF/COPD. She was seen by pulmonology, who recommended BiPAP QHS. Since being started on BiPAP she has clinically improved and will need to be on QHS BiPAP long-term. Currently breathing comfortably on [place]. Due to her multiple medical problems, PMT consulted for goals of care discussion. At this time all available treatments are requested including CPR.  1. Possible aspiration pneumonia. Resolved. She remained afebrile and hemodynamically stable. She has completed a course of abx.  2. Atrial fibrillation, [place] score 5. Started on Eliquis. Continue on Coreg and digoxin.  3. Acute on chronic combined systolic and diastolic CHF with EF 40-45% with wall motion abnormality. Patient was diuresed with IV lasix and has since transitioned to oral lasix. Appreciate cardiology input.  4. COPD exacerbation. Improved. She was treated with IV steroids and has since been transitioned to prednisone taper. Continue nebs. She has competed a course of abx.  5. DM Type 2, diet controlled. Continue SSI.  Procedures:  Left-sided thoracentesis with 1L transudative fluid removed 5/22  ECHO Study Conclusions  - Left ventricle: The cavity size was normal. Wall thickness was  increased in a pattern of mild LVH. Systolic function was mildly  to moderately reduced. The estimated ejection fraction was in the  range of 40% to 45%. Diffuse hypokinesis. There is akinesis of  the basalinferior myocardium. The study is not technically  sufficient to allow evaluation of LV diastolic function. - Ventricular  septum: Septal motion showed abnormal function and  dyssynergy. The contour showed diastolic flattening. - Aortic valve: Trileaflet; mildly calcified leaflets. Left  coronary cusp mobility was restricted. There was trivial  regurgitation. - Mitral valve: Calcified annulus. There was mild to moderate  regurgitation. - Left atrium:  The atrium was moderately dilated. - Right ventricle: The cavity size was severely dilated. Systolic  function was severely reduced. - Right atrium: The atrium was moderately dilated. Central venous  pressure (est): 8 mm Hg. - Tricuspid valve: There was mild regurgitation. - Pulmonary arteries: PA peak pressure: 30 mm Hg (S). - Pericardium, extracardiac: A small pericardial effusion was  identified circumferential to the heart. There was a left pleural  effusion. Consultations:  Pulmonary   Cardiology   Palliative   PT-SNF  06/12/2016 1st [facility] office visit/ [facility] era Wert   Chief Complaint  Patient presents with  . Pulmonary Consult    Referred by Dr. [name] for eval of elevated co2 level. Pt c/o DOE off and on "it's not that bad". She started on BIPAP approx 5 wks ago and wishes to come off of this because it's uncomfortable. She uses o2 24/7 2.5lpm.    prior to admit 2lpm 24/7 now 2.5 lpm plus bipap  Feels rested s HA in am, has not tried off bipap hs since admit. Has duoneb doesn't think it really does much for her   Doe= MMRC3 = can't walk 100 yards even at a slow pace at a flat grade s stopping due to sob  Even on 02   No obvious day to day or daytime variability or assoc excess/ purulent sputum or mucus plugs or hemoptysis or cp or chest tightness, subjective wheeze or overt sinus or hb symptoms. No unusual exp hx or h/o childhood pna/ asthma or knowledge of premature birth.  Sleeping ok on bipap/ 02 without nocturnal  or early am exacerbation  of respiratory  c/o's or need for noct saba. Also denies any obvious fluctuation of symptoms with weather or environmental changes or other aggravating or alleviating factors except as outlined above   Current Medications, Allergies, Complete Past Medical History, Past Surgical History, Family History, and Social History were reviewed in [employer] record.  ROS  The following are not  active complaints unless bolded sore throat, dysphagia, dental problems, itching, sneezing,  nasal congestion or excess/ purulent secretions, ear ache,   fever, chills, sweats, unintended wt loss, classically pleuritic or exertional cp,  orthopnea pnd or leg swelling, presyncope, palpitations, abdominal pain, anorexia, nausea, vomiting, diarrhea  or change in bowel or bladder habits, change in stools or urine, dysuria,hematuria,  rash, arthralgias, visual complaints, headache, numbness, weakness or ataxia or problems with walking or coordination,  change in mood/affect or memory.          Review of Systems     Objective:   Physical Exam    amb hoarse wf needs help to get on exam table/ a bit unsteady of her feet/ weak legs but no sob / some pseudowheeze  Wt Readings from Last 3 Encounters:  06/12/16 149 lb (67.6 kg)  06/04/16 149 lb 12.8 oz (67.9 kg)  04/18/16 165 lb 1.6 oz (74.9 kg)    Vital signs reviewed - note sats 100% on 2.5 lpm on arrival    HEENT: nl dentition, turbinates, and oropharynx. Nl external ear canals without cough reflex   NECK :  without JVD/Nodes/TM/ nl carotid upstrokes bilaterally   LUNGS: no  acc muscle use,  Nl contour chest which is clear to A and P bilaterally without cough on insp or exp maneuvers   CV:  RRR  no s3 or murmur or increase in P2, no edema   ABD:  soft and nontender with nl inspiratory excursion in the supine position. No bruits or organomegaly, bowel sounds nl  MS:  Nl gait/ ext warm without deformities, calf tenderness, cyanosis or clubbing No obvious joint restrictions   SKIN: warm and dry without lesions    NEURO:  alert, approp, nl sensorium with  no motor deficits    CXR PA and Lateral:   06/12/2016 :    I personally reviewed images and agree with radiology impression as follows:   COPD. No pneumonia nor pleural effusion which is significant improvement since the previous study. Cardiomegaly without pulmonary Edema.   Labs  ordered/ reviewed:      Chemistry      Component Value Date/Time   NA 139 06/12/2016 1049   NA 135 (A) 03/20/2016   K 4.4 06/12/2016 1049   CL 95 (L) 06/12/2016 1049   CO2 36 (H) 06/12/2016 1049   BUN 18 06/12/2016 1049   BUN 48 (A) 03/20/2016   CREATININE 0.97 06/12/2016 1049   CREATININE 0.68 02/27/2016 1220   GLU 210 03/20/2016      Component Value Date/Time   CALCIUM 9.5 06/12/2016 1049   ALKPHOS 42 03/10/2016 0625   AST 16 03/10/2016 0625   ALT 16 03/10/2016 0625   BILITOT 0.7 03/10/2016 0625        Lab Results  Component Value Date   WBC 7.6 06/12/2016   HGB 11.9 (L) 06/12/2016   HCT 35.7 (L) 06/12/2016   MCV 85.6 06/12/2016   PLT 201.0 06/12/2016       Lab Results  Component Value Date   TSH 2.65 06/12/2016     Lab Results  Component Value Date   PROBNP 358.0 (H) 06/12/2016              Assessment:

## 2016-06-12 NOTE — Patient Instructions (Signed)
Stop corevidol and take bisoprolol 5 mg one daily   Ok to use the bipap just half the night for now but use the full night if any am drowsiness/headache  Ok to just use the duoneb up to every 4 hours if needed for breathing  Change omeprazole to Take 30- 60 min before your first and last meals of the day   GERD (REFLUX)  is an extremely common cause of respiratory symptoms just like yours , many times with no obvious heartburn at all.    It can be treated with medication, but also with lifestyle changes including elevation of the head of your bed (ideally with 6 inch  bed blocks),  Smoking cessation, avoidance of late meals, excessive alcohol, and avoid fatty foods, chocolate, peppermint, colas, red wine, and acidic juices such as orange juice.  NO MINT OR MENTHOL PRODUCTS SO NO COUGH DROPS   USE SUGARLESS CANDY INSTEAD (Jolley ranchers or Stover's or Life Savers) or even ice chips will also do - the key is to swallow to prevent all throat clearing. NO OIL BASED VITAMINS - use powdered substitutes.    Please remember to go to the lab and x-ray department downstairs for your tests - we will call you with the results when they are available  Please schedule a follow up office visit in 2 weeks, [facility]

## 2016-06-13 DIAGNOSIS — J9611 Chronic respiratory failure with hypoxia: Secondary | ICD-10-CM | POA: Insufficient documentation

## 2016-06-13 DIAGNOSIS — J9612 Chronic respiratory failure with hypercapnia: Secondary | ICD-10-CM

## 2016-06-13 NOTE — Assessment & Plan Note (Signed)
bipap hs since 5/221/17 admit  - HCO3  06/12/2016   = 36   She is doing much better and not clear whether she really needs the bipap which she is not happy about so it's reasonable to try cutting down to just half the night  Also note her sats are on the high side now which may reduce her drive to breath so if she starts getting HA's or ams may need to titrate this down to the lower 90s.    NB  The mild /moderate chronic hypercabia is actually an advantage in severe copd as it lowers the ventilatory demand and makes the lungs better scavengers of CO2 so we don't necessarily need to treat the numbers here and chronic bipap may interfere with the natural compensatory measures at play so is not necessarily needed or indicated in all cases. We will see whether or not this prinicple applies here.

## 2016-06-13 NOTE — Progress Notes (Signed)
Spoke with pt and notified of results per Dr. Wert. Pt verbalized understanding and denied any questions. 

## 2016-06-13 NOTE — Assessment & Plan Note (Signed)
No pfts on file, appears to be mostly emphysematous and may benefit from change coreg to bisoprolol (less spillover B2 antagonists)  more than adding additional saba/laba (B2 agonists)   For now rec duoneb prn pending return with spirometry

## 2016-06-13 NOTE — Assessment & Plan Note (Addendum)
Assoc with hypercarbic/ hypoxemic resp failure and clearly multifactorial.   DDX of  difficult airways management almost all start with A and  include Adherence, Ace Inhibitors, Acid Reflux, Active Sinus Disease, Alpha 1 Antitripsin deficiency, Anxiety masquerading as Airways dz,  ABPA,  Allergy(esp in young), Aspiration (esp in elderly), Adverse effects of meds,  Active smokers, A bunch of PE's (a small clot burden can't cause this syndrome unless there is already severe underlying pulm or vascular dz with poor reserve) plus two Bs  = Bronchiectasis and Beta blocker use..and one C= CHF  Adherence is always the initial "prime suspect" and is a multilayered concern that requires a "trust but verify" approach in every patient - starting with knowing how to use medications, especially inhalers, correctly, keeping up with refills and understanding the fundamental difference between maintenance and prns vs those medications only taken for a very short course and then stopped and not refilled.   ? Acid (or non-acid) GERD > always difficult to exclude as up to 75% of pts in some series report no assoc GI/ Heartburn symptoms> rec max (24h)  acid suppression and diet restrictions/ reviewed and instructions given in writing.   ? Allergy/ asthmatic component > not likely by hx though difficult to be sure   ? A bunch of PE's > unlikely on NOAC  ? BB > ideally prefer bisoprolol here    ? CHF with tendency to pleural effusions >  Note the effects of copd and pleural effusions (which she had) are synergistic in compromising insp muscle function since they have the same effect: shortening the length of the key insp muscles at onset of inspiration  Total time devoted to counseling  = 35/3766m review case with pt/ discussion of options/alternatives/ personally creating written instructions  in presence of pt  then going over those specific  Instructions directly with the pt including how to use all of the meds but in  particular covering each new medication in detail and the difference between the maintenance/automatic meds and the prns using an action plan format for the latter.

## 2016-06-13 NOTE — Assessment & Plan Note (Signed)
Strongly prefer in this setting: Bystolic, the most beta -1  selective Beta blocker available in sample form, with bisoprolol the most selective generic choice  on the market.   Try bisoprolol 5 mg daily  

## 2016-06-18 DIAGNOSIS — H1820 Unspecified corneal edema: Secondary | ICD-10-CM | POA: Diagnosis not present

## 2016-06-18 DIAGNOSIS — Z961 Presence of intraocular lens: Secondary | ICD-10-CM | POA: Diagnosis not present

## 2016-06-18 DIAGNOSIS — H3530 Unspecified macular degeneration: Secondary | ICD-10-CM | POA: Diagnosis not present

## 2016-06-18 DIAGNOSIS — H179 Unspecified corneal scar and opacity: Secondary | ICD-10-CM | POA: Diagnosis not present

## 2016-06-21 ENCOUNTER — Emergency Department (HOSPITAL_COMMUNITY): Payer: Medicare Other

## 2016-06-21 ENCOUNTER — Inpatient Hospital Stay (HOSPITAL_COMMUNITY)
Admission: EM | Admit: 2016-06-21 | Discharge: 2016-06-27 | DRG: 243 | Disposition: A | Discharge status: Home Health | Payer: Medicare Other | Attending: Internal Medicine | Admitting: Internal Medicine

## 2016-06-21 ENCOUNTER — Encounter (HOSPITAL_COMMUNITY): Payer: Self-pay | Admitting: *Deleted

## 2016-06-21 DIAGNOSIS — Z79899 Other long term (current) drug therapy: Secondary | ICD-10-CM

## 2016-06-21 DIAGNOSIS — I4891 Unspecified atrial fibrillation: Secondary | ICD-10-CM | POA: Diagnosis not present

## 2016-06-21 DIAGNOSIS — E1165 Type 2 diabetes mellitus with hyperglycemia: Secondary | ICD-10-CM | POA: Diagnosis not present

## 2016-06-21 DIAGNOSIS — R001 Bradycardia, unspecified: Principal | ICD-10-CM | POA: Diagnosis present

## 2016-06-21 DIAGNOSIS — E119 Type 2 diabetes mellitus without complications: Secondary | ICD-10-CM

## 2016-06-21 DIAGNOSIS — Z87891 Personal history of nicotine dependence: Secondary | ICD-10-CM

## 2016-06-21 DIAGNOSIS — I5040 Unspecified combined systolic (congestive) and diastolic (congestive) heart failure: Secondary | ICD-10-CM | POA: Diagnosis present

## 2016-06-21 DIAGNOSIS — T460X5A Adverse effect of cardiac-stimulant glycosides and drugs of similar action, initial encounter: Secondary | ICD-10-CM | POA: Diagnosis present

## 2016-06-21 DIAGNOSIS — Z9049 Acquired absence of other specified parts of digestive tract: Secondary | ICD-10-CM

## 2016-06-21 DIAGNOSIS — T460X1A Poisoning by cardiac-stimulant glycosides and drugs of similar action, accidental (unintentional), initial encounter: Secondary | ICD-10-CM | POA: Diagnosis not present

## 2016-06-21 DIAGNOSIS — Z955 Presence of coronary angioplasty implant and graft: Secondary | ICD-10-CM

## 2016-06-21 DIAGNOSIS — E785 Hyperlipidemia, unspecified: Secondary | ICD-10-CM | POA: Diagnosis present

## 2016-06-21 DIAGNOSIS — I2510 Atherosclerotic heart disease of native coronary artery without angina pectoris: Secondary | ICD-10-CM | POA: Diagnosis present

## 2016-06-21 DIAGNOSIS — Z833 Family history of diabetes mellitus: Secondary | ICD-10-CM

## 2016-06-21 DIAGNOSIS — R55 Syncope and collapse: Secondary | ICD-10-CM | POA: Diagnosis present

## 2016-06-21 DIAGNOSIS — Z7901 Long term (current) use of anticoagulants: Secondary | ICD-10-CM

## 2016-06-21 DIAGNOSIS — Z885 Allergy status to narcotic agent status: Secondary | ICD-10-CM

## 2016-06-21 DIAGNOSIS — I5042 Chronic combined systolic (congestive) and diastolic (congestive) heart failure: Secondary | ICD-10-CM | POA: Diagnosis not present

## 2016-06-21 DIAGNOSIS — S0990XA Unspecified injury of head, initial encounter: Secondary | ICD-10-CM | POA: Diagnosis not present

## 2016-06-21 DIAGNOSIS — I959 Hypotension, unspecified: Secondary | ICD-10-CM | POA: Diagnosis not present

## 2016-06-21 DIAGNOSIS — I481 Persistent atrial fibrillation: Secondary | ICD-10-CM | POA: Diagnosis present

## 2016-06-21 DIAGNOSIS — R2681 Unsteadiness on feet: Secondary | ICD-10-CM | POA: Diagnosis present

## 2016-06-21 DIAGNOSIS — I252 Old myocardial infarction: Secondary | ICD-10-CM

## 2016-06-21 DIAGNOSIS — I110 Hypertensive heart disease with heart failure: Secondary | ICD-10-CM | POA: Diagnosis present

## 2016-06-21 DIAGNOSIS — R404 Transient alteration of awareness: Secondary | ICD-10-CM | POA: Diagnosis not present

## 2016-06-21 DIAGNOSIS — N390 Urinary tract infection, site not specified: Secondary | ICD-10-CM | POA: Diagnosis present

## 2016-06-21 DIAGNOSIS — J9612 Chronic respiratory failure with hypercapnia: Secondary | ICD-10-CM | POA: Diagnosis present

## 2016-06-21 DIAGNOSIS — J9611 Chronic respiratory failure with hypoxia: Secondary | ICD-10-CM | POA: Diagnosis not present

## 2016-06-21 DIAGNOSIS — Z95818 Presence of other cardiac implants and grafts: Secondary | ICD-10-CM

## 2016-06-21 DIAGNOSIS — J449 Chronic obstructive pulmonary disease, unspecified: Secondary | ICD-10-CM | POA: Diagnosis present

## 2016-06-21 DIAGNOSIS — I10 Essential (primary) hypertension: Secondary | ICD-10-CM | POA: Diagnosis present

## 2016-06-21 DIAGNOSIS — R42 Dizziness and giddiness: Secondary | ICD-10-CM | POA: Diagnosis not present

## 2016-06-21 DIAGNOSIS — Z8249 Family history of ischemic heart disease and other diseases of the circulatory system: Secondary | ICD-10-CM

## 2016-06-21 DIAGNOSIS — Z9981 Dependence on supplemental oxygen: Secondary | ICD-10-CM

## 2016-06-21 LAB — TROPONIN I

## 2016-06-21 LAB — GLUCOSE, CAPILLARY: GLUCOSE-CAPILLARY: 104 mg/dL — AB (ref 65–99)

## 2016-06-21 LAB — COMPREHENSIVE METABOLIC PANEL
ALT: 15 U/L (ref 14–54)
ANION GAP: 6 (ref 5–15)
AST: 15 U/L (ref 15–41)
Albumin: 3.7 g/dL (ref 3.5–5.0)
Alkaline Phosphatase: 59 U/L (ref 38–126)
BILIRUBIN TOTAL: 0.8 mg/dL (ref 0.3–1.2)
BUN: 19 mg/dL (ref 6–20)
CO2: 32 mmol/L (ref 22–32)
Calcium: 9 mg/dL (ref 8.9–10.3)
Chloride: 101 mmol/L (ref 101–111)
Creatinine, Ser: 0.88 mg/dL (ref 0.44–1.00)
GFR, EST NON AFRICAN AMERICAN: 59 mL/min — AB (ref >60)
Glucose, Bld: 191 mg/dL — ABNORMAL HIGH (ref 65–99)
POTASSIUM: 4.8 mmol/L (ref 3.5–5.1)
Sodium: 139 mmol/L (ref 135–145)
TOTAL PROTEIN: 6.4 g/dL — AB (ref 6.5–8.1)

## 2016-06-21 LAB — CBC WITH DIFFERENTIAL/PLATELET
BASOS PCT: 0 %
Basophils Absolute: 0 10*3/uL (ref 0.0–0.1)
Eosinophils Absolute: 0.1 10*3/uL (ref 0.0–0.7)
Eosinophils Relative: 1 %
HEMATOCRIT: 35.7 % — AB (ref 36.0–46.0)
Hemoglobin: 11 g/dL — ABNORMAL LOW (ref 12.0–15.0)
Lymphocytes Relative: 26 %
Lymphs Abs: 1.8 10*3/uL (ref 0.7–4.0)
MCH: 28 pg (ref 26.0–34.0)
MCHC: 30.8 g/dL (ref 30.0–36.0)
MCV: 90.8 fL (ref 78.0–100.0)
MONO ABS: 0.6 10*3/uL (ref 0.1–1.0)
MONOS PCT: 9 %
NEUTROS ABS: 4.4 10*3/uL (ref 1.7–7.7)
Neutrophils Relative %: 64 %
Platelets: 166 10*3/uL (ref 150–400)
RBC: 3.93 MIL/uL (ref 3.87–5.11)
RDW: 14.1 % (ref 11.5–15.5)
WBC: 6.9 10*3/uL (ref 4.0–10.5)

## 2016-06-21 LAB — URINALYSIS, ROUTINE W REFLEX MICROSCOPIC
BILIRUBIN URINE: NEGATIVE
GLUCOSE, UA: NEGATIVE mg/dL
Hgb urine dipstick: NEGATIVE
Ketones, ur: NEGATIVE mg/dL
Nitrite: POSITIVE — AB
PROTEIN: NEGATIVE mg/dL
Specific Gravity, Urine: 1.01 (ref 1.005–1.030)
pH: 6 (ref 5.0–8.0)

## 2016-06-21 LAB — URINE MICROSCOPIC-ADD ON

## 2016-06-21 LAB — DIGOXIN LEVEL: DIGOXIN LVL: 2.8 ng/mL — AB (ref 0.8–2.0)

## 2016-06-21 LAB — PROTIME-INR
INR: 1.19
PROTHROMBIN TIME: 15.2 s (ref 11.4–15.2)

## 2016-06-21 LAB — CBG MONITORING, ED: Glucose-Capillary: 172 mg/dL — ABNORMAL HIGH (ref 65–99)

## 2016-06-21 MED ORDER — SODIUM CHLORIDE 0.9 % IV BOLUS (SEPSIS)
1000.0000 mL | Freq: Once | INTRAVENOUS | Status: AC
  Administered 2016-06-21: 1000 mL via INTRAVENOUS

## 2016-06-21 MED ORDER — IPRATROPIUM-ALBUTEROL 0.5-2.5 (3) MG/3ML IN SOLN: 3.0000 mL | Freq: Four times a day (QID) | RESPIRATORY_TRACT | Status: DC | PRN

## 2016-06-21 MED ORDER — APIXABAN 5 MG PO TABS
5.0000 mg | ORAL_TABLET | Freq: Two times a day (BID) | ORAL | Status: DC
  Administered 2016-06-21 – 2016-06-27 (×12): 5 mg via ORAL
  Filled 2016-06-21 (×12): qty 1

## 2016-06-21 MED ORDER — SODIUM CHLORIDE 0.9 % IV SOLN
INTRAVENOUS | Status: DC
  Administered 2016-06-21: 19:00:00 via INTRAVENOUS

## 2016-06-21 MED ORDER — PANTOPRAZOLE SODIUM 40 MG PO TBEC
40.0000 mg | DELAYED_RELEASE_TABLET | Freq: Every day | ORAL | Status: DC
  Administered 2016-06-22 – 2016-06-27 (×6): 40 mg via ORAL
  Filled 2016-06-21 (×6): qty 1

## 2016-06-21 MED ORDER — FUROSEMIDE 40 MG PO TABS
40.0000 mg | ORAL_TABLET | Freq: Every day | ORAL | Status: DC
  Administered 2016-06-23 – 2016-06-25 (×3): 40 mg via ORAL
  Filled 2016-06-21 (×4): qty 1

## 2016-06-21 MED ORDER — INSULIN ASPART 100 UNIT/ML ~~LOC~~ SOLN
0.0000 [IU] | Freq: Every day | SUBCUTANEOUS | Status: DC
Start: 2016-06-21 — End: 2016-06-27
  Administered 2016-06-23 – 2016-06-24 (×2): 2 [IU] via SUBCUTANEOUS

## 2016-06-21 MED ORDER — ACETAMINOPHEN 325 MG PO TABS
650.0000 mg | ORAL_TABLET | Freq: Four times a day (QID) | ORAL | Status: DC | PRN
  Administered 2016-06-22 – 2016-06-26 (×9): 650 mg via ORAL
  Filled 2016-06-21 (×9): qty 2

## 2016-06-21 MED ORDER — ESCITALOPRAM OXALATE 10 MG PO TABS
10.0000 mg | ORAL_TABLET | Freq: Every day | ORAL | Status: DC
  Administered 2016-06-22 – 2016-06-27 (×6): 10 mg via ORAL
  Filled 2016-06-21 (×6): qty 1

## 2016-06-21 MED ORDER — ATROPINE SULFATE 1 MG/10ML IJ SOSY
0.5000 mg | PREFILLED_SYRINGE | Freq: Once | INTRAMUSCULAR | Status: AC
  Administered 2016-06-21: 0.5 mg via INTRAVENOUS
  Filled 2016-06-21: qty 10

## 2016-06-21 MED ORDER — ACETAMINOPHEN 650 MG RE SUPP: 650.0000 mg | Freq: Four times a day (QID) | RECTAL | Status: DC | PRN

## 2016-06-21 MED ORDER — INSULIN ASPART 100 UNIT/ML ~~LOC~~ SOLN
0.0000 [IU] | Freq: Three times a day (TID) | SUBCUTANEOUS | Status: DC
  Administered 2016-06-22 (×2): 3 [IU] via SUBCUTANEOUS
  Administered 2016-06-22 – 2016-06-23 (×2): 2 [IU] via SUBCUTANEOUS
  Administered 2016-06-23: 3 [IU] via SUBCUTANEOUS
  Administered 2016-06-24: 5 [IU] via SUBCUTANEOUS
  Administered 2016-06-24: 3 [IU] via SUBCUTANEOUS
  Administered 2016-06-25 (×2): 2 [IU] via SUBCUTANEOUS
  Administered 2016-06-25: 3 [IU] via SUBCUTANEOUS
  Administered 2016-06-26: 2 [IU] via SUBCUTANEOUS

## 2016-06-21 MED ORDER — LORAZEPAM 0.5 MG PO TABS
0.5000 mg | ORAL_TABLET | Freq: Every day | ORAL | Status: DC
  Administered 2016-06-22 – 2016-06-26 (×4): 0.5 mg via ORAL
  Filled 2016-06-21 (×4): qty 1

## 2016-06-21 MED ORDER — ATROPINE SULFATE 1 MG/ML IJ SOLN
0.5000 mg | Freq: Once | INTRAMUSCULAR | Status: AC
  Administered 2016-06-21: 0.5 mg via INTRAVENOUS
  Filled 2016-06-21: qty 1

## 2016-06-21 MED ORDER — ATROPINE SULFATE 1 MG/10ML IJ SOSY
PREFILLED_SYRINGE | INTRAMUSCULAR | Status: AC
  Filled 2016-06-21: qty 10

## 2016-06-21 NOTE — ED Notes (Signed)
CRITICAL VALUE ALERT  Critical value received:  Digoxin 2.8  Date of notification:  06/21/16  Time of notification:  1828  Critical value read back:Yes.    Nurse who received alert:  Viviano SimasLauren Jalilah Wiltsie, RN  MD notified (1st page):  [facility]

## 2016-06-21 NOTE — ED Notes (Signed)
Report to [place], [facility]ity [facility], 3rd Floor

## 2016-06-21 NOTE — H&P (Signed)
History and Physical  [place] ZOX:[id number] DOB: 12/17/1930 DOA: 06/21/2016  Referring physician: Dr Particia NearingHaviland, ED physician PCP: Dwana MelenaZack Hall, MD  Outpatient Specialists:   Dr Diona BrownerMcDowell  Dr Sherene SiresWert  Dr Jena Gaussourk  Chief Complaint: Dizziness, syncopal episode  HPI: [place] is a 82 y.o. female with a history of atrial fibrillation with a chads score of 5 on aspirin and on Eliquis, coronary artery disease with stenting of the RCA and a history of inferior wall MI in 1999, hypertension, type 2 diabetes, hyperlipidemia. She was seen by pulmonology in approximately 2 weeks ago, who switched her from Coreg to bisoprolol and increased her Prilosec to 20 twice a day. A few days after making this change to her medication, the patient started having dizziness and lightheadedness which worsened until she was consistently having lightheaded this when she was ambulating. Today she had a syncopal episode.  Her symptoms would improve with sitting and rest and worse with standing. She denies chest pain, problems breathing, headaches, blurred vision. After her single episode, EMS was called and the patient was brought to the hospital. Initially, the patient's heart rate was a proximally 30.  Emergency department course: Patient was given atropine 0.5 mg with good response. Patient was started on IV fluids was found to have a digoxin level of 2.8.   Review of Systems:   Pt denies any fevers, chills, nausea, vomiting, diarrhea, constipation, abdominal pain, shortness of breath, dyspnea on exertion, orthopnea, cough, wheezing, palpitations, headache, vision changes, melena, rectal bleeding.  Review of systems are otherwise negative  Past Medical History:  Diagnosis Date  . A-fib (HCC)   . Asthma   . COPD (chronic obstructive pulmonary disease) (HCC)    Oxygen dependent  . Coronary atherosclerosis of native coronary artery   . Essential hypertension, benign   . History of GI bleed    ulcer  .  Hyperlipidemia   . Old inferior wall myocardial infarction 1999   [facility]  . Type 2 diabetes mellitus (HCC)    Past Surgical History:  Procedure Laterality Date  . Cesareen section    . CHOLECYSTECTOMY    . COLONOSCOPY  09/2004   RMR: For obscure GI bleed. Left-sided diverticula, otherwise normal ileocolonoscopy  . Coronary artery stent  1999   BMS x 3 RCA  ([facility]), PTCRA RC4  . ESOPHAGOGASTRODUODENOSCOPY  11 2005   RMR: For melena, normal exam.  . EXPLORATORY LAPAROTOMY    . GIVENS CAPSULE STUDY  08/2004   official report unavailable, but reported couple of AVMs, nonbleeding  . PARTIAL COLECTOMY  1977   blockage/gangrene? unclear if small bowel or colon  . THORACENTESIS Left 03/10/16   1 L transudative fluid   Social History:  reports that she quit smoking about 18 years ago. Her smoking use included Cigarettes. She has a 80.00 pack-year smoking history. She has never used smokeless tobacco. She reports that she does not drink alcohol or use drugs. Patient lives at Home  Allergies  Allergen Reactions  . Codeine Nausea Only  . Heparin Other (See Comments)    Causes internal bleeding     Family History  Problem Relation Age of Onset  . Diabetes Mother   . Diabetes Father   . Heart disease Father   . Cancer Sister     unknown  . Cancer Brother     unknown  . Diabetes Brother   . Colon cancer Neg Hx       Prior to Admission medications  Medication Sig Start Date End Date Taking? Authorizing Provider  apixaban (ELIQUIS) 5 MG TABS tablet Take 1 tablet (5 mg total) by mouth 2 (two) times daily. 03/20/16  Yes Erick Blinks, MD  bisoprolol (ZEBETA) 5 MG tablet Take 1 tablet (5 mg total) by mouth daily. 06/12/16  Yes Nyoka Cowden, MD  digoxin (LANOXIN) 0.25 MG tablet Take 0.25 mg by mouth daily.   Yes Historical Provider, MD  escitalopram (LEXAPRO) 20 MG tablet Take 10 mg by mouth daily.    Yes Historical Provider, MD  furosemide (LASIX) 40 MG tablet Take 40 mg by mouth daily.  Daily at 0800 am   Yes Historical Provider, MD  ipratropium-albuterol (DUONEB) 0.5-2.5 (3) MG/3ML SOLN Take 3 mLs by nebulization 3 (three) times daily. Patient taking differently: Take 3 mLs by nebulization every 6 (six) hours as needed (for shortness of breath).  03/20/16  Yes Erick Blinks, MD  LORazepam (ATIVAN) 0.5 MG tablet Take 1 tablet (0.5 mg total) by mouth daily. Take one tablet by mouth once daily Patient taking differently: Take 0.5 mg by mouth at bedtime. Take one tablet by mouth once daily 04/15/16  Yes Sharon Seller, NP  Multiple Vitamins-Minerals (CENTRUM SILVER 50+WOMEN PO) Take 1 tablet by mouth daily.   Yes Historical Provider, MD  nitroGLYCERIN (NITROSTAT) 0.4 MG SL tablet Place 1 tablet (0.4 mg total) under the tongue every 5 (five) minutes as needed for chest pain. 04/24/14  Yes Jonelle Sidle, MD  NON FORMULARY Bipap qhs with IPAP of 20 and EPAP of 5. Tidal volume of 600 and respiratory rate of 16. Twice a day   Yes Historical Provider, MD  omeprazole (PRILOSEC) 20 MG capsule Take 1 capsule (20 mg total) by mouth 2 (two) times daily before a meal. 06/12/16  Yes Nyoka Cowden, MD  OXYGEN Inhale 2.5 L into the lungs continuous.   Yes Historical Provider, MD  potassium chloride (K-DUR,KLOR-CON) 10 MEQ tablet Take 10 mEq by mouth 2 (two) times daily.   Yes Historical Provider, MD  vitamin B-12 (CYANOCOBALAMIN) 1000 MCG tablet Take 1,000 mcg by mouth daily.   Yes Historical Provider, MD    Physical Exam: BP (!) 135/49   Pulse (!) 52   Temp 97.6 F (36.4 C) (Oral)   Resp 21   Ht 5\' 4"  (1.626 m)   Wt 68 kg (150 lb)   SpO2 97%   BMI 25.75 kg/m   General: Elderly Caucasian female. Awake and alert and oriented x3. No acute cardiopulmonary distress.  HEENT: Normocephalic atraumatic.  Right and left ears normal in appearance.  Pupils equal, round, reactive to light. Extraocular muscles are intact. Sclerae anicteric and noninjected.  Moist mucosal membranes. No mucosal  lesions.  Neck: Neck supple without lymphadenopathy. No carotid bruits. No masses palpated.  Cardiovascular: Regular rate with normal S1-S2 sounds. No murmurs, rubs, gallops auscultated. No JVD.  Respiratory: Good respiratory effort with no wheezes, rales, rhonchi. Lungs clear to auscultation bilaterally.  No accessory muscle use. Abdomen: Soft, nontender, nondistended. Active bowel sounds. No masses or hepatosplenomegaly  Skin: No rashes, lesions, or ulcerations.  Dry, warm to touch. 2+ dorsalis pedis and radial pulses. Musculoskeletal: No calf or leg pain. All major joints not erythematous nontender.  No upper or lower joint deformation.  Good ROM.  No contractures  Psychiatric: Intact judgment and insight. Pleasant and cooperative. Neurologic: No focal neurological deficits. Strength is 5/5 and symmetric in upper and lower extremities.  Cranial nerves II through XII are  grossly intact.           Labs on Admission: I have personally reviewed following labs and imaging studies  CBC:  Recent Labs Lab 06/21/16 1654  WBC 6.9  NEUTROABS 4.4  HGB 11.0*  HCT 35.7*  MCV 90.8  PLT 166   Basic Metabolic Panel:  Recent Labs Lab 06/21/16 1654  NA 139  K 4.8  CL 101  CO2 32  GLUCOSE 191*  BUN 19  CREATININE 0.88  CALCIUM 9.0   GFR: Estimated Creatinine Clearance: 45.9 mL/min (by C-G formula based on SCr of 0.88 mg/dL). Liver Function Tests:  Recent Labs Lab 06/21/16 1654  AST 15  ALT 15  ALKPHOS 59  BILITOT 0.8  PROT 6.4*  ALBUMIN 3.7   No results for input(s): LIPASE, AMYLASE in the last 168 hours. No results for input(s): AMMONIA in the last 168 hours. Coagulation Profile:  Recent Labs Lab 06/21/16 1654  INR 1.19   Cardiac Enzymes:  Recent Labs Lab 06/21/16 1654  TROPONINI <0.03   BNP (last 3 results)  Recent Labs  06/12/16 1049  PROBNP 358.0*   HbA1C: No results for input(s): HGBA1C in the last 72 hours. CBG:  Recent Labs Lab 06/21/16 1647    GLUCAP 172*   Lipid Profile: No results for input(s): CHOL, HDL, LDLCALC, TRIG, CHOLHDL, LDLDIRECT in the last 72 hours. Thyroid Function Tests: No results for input(s): TSH, T4TOTAL, FREET4, T3FREE, THYROIDAB in the last 72 hours. Anemia Panel: No results for input(s): VITAMINB12, FOLATE, FERRITIN, TIBC, IRON, RETICCTPCT in the last 72 hours. Urine analysis:    Component Value Date/Time   COLORURINE YELLOW 06/21/2016 1810   APPEARANCEUR CLEAR 06/21/2016 1810   LABSPEC 1.010 06/21/2016 1810   PHURINE 6.0 06/21/2016 1810   GLUCOSEU NEGATIVE 06/21/2016 1810   HGBUR NEGATIVE 06/21/2016 1810   BILIRUBINUR NEGATIVE 06/21/2016 1810   KETONESUR NEGATIVE 06/21/2016 1810   PROTEINUR NEGATIVE 06/21/2016 1810   NITRITE POSITIVE (A) 06/21/2016 1810   LEUKOCYTESUR SMALL (A) 06/21/2016 1810   Sepsis Labs: @LABRCNTIP (procalcitonin:4,lacticidven:4) )No results found for this or any previous visit (from the past 240 hour(s)).   Radiological Exams on Admission: Ct Head Wo Contrast  Result Date: 06/21/2016 CLINICAL DATA:  Unwitnessed fall. Syncope. Dizziness. History of diabetes, hypertension, coronary artery disease. EXAM: CT HEAD WITHOUT CONTRAST TECHNIQUE: Contiguous axial images were obtained from the base of the skull through the vertex without intravenous contrast. COMPARISON:  None. FINDINGS: Brain: Diffuse cerebral atrophy. Mild ventricular dilatation consistent with central atrophy. Patchy low-attenuation changes in the deep white matter consistent with small vessel ischemia. No mass effect or midline shift. No abnormal extra-axial fluid collections. Gray-white matter junctions are distinct. Basal cisterns are not effaced. No evidence of acute intracranial hemorrhage. Vascular: No hyperdense vessel or unexpected calcification. Skull: No depressed skull fractures. Sinuses/Orbits: Mucosal thickening in the paranasal sinuses. Opacification of the left sphenoid sinus. Mastoid air cells are not  opacified. Other: None. IMPRESSION: No acute intracranial abnormalities. Mild chronic atrophy and small vessel ischemic changes. Electronically Signed   By: Burman Nieves M.D.   On: 06/21/2016 18:54   Dg Chest Port 1 View  Result Date: 06/21/2016 CLINICAL DATA:  Syncope.  Dizziness.  Bradycardia. EXAM: PORTABLE CHEST 1 VIEW COMPARISON:  06/12/2016 FINDINGS: Moderate cardiomegaly remains stable. Pulmonary hyperinflation is again noted. Mild scarring again noted in the left lung. No evidence of pulmonary consolidation or pneumothorax. IMPRESSION: Stable cardiomegaly and probable COPD.  No acute findings. Electronically Signed   By: Jonny Ruiz  Eppie Gibson M.D.   On: 06/21/2016 17:21    EKG: Independently reviewed. Bradycardia with heart rate in the 30s. Atrial fibrillation. Right bundle branch block with left anterior fascicular block. Acute ST elevation or depression.  Assessment/Plan: Principal Problem:   Symptomatic bradycardia Active Problems:   Type 2 diabetes mellitus (HCC)   Essential hypertension, benign   Atrial fibrillation (HCC)   Chronic respiratory failure with hypoxia and hypercapnia (HCC)   Digoxin toxicity    This patient was discussed with the ED physician, including pertinent vitals, physical exam findings, labs, and imaging.  We also discussed care given by the ED provider.  #1 Symptomatic bradycardia  Observation  Improved after atropine  Telemetry monitoring  Hold beta blocker and digoxin #2 digoxin toxicity  Check digoxin level tonight and tomorrow  Hold digoxin  I discussed this with the pharmacist - digoxin levels can be affected by omeprazole there is an increased risk of digoxin toxicity with omeprazole. I would recommend decreasing the patient's omeprazole back to 20mg  once a day #3 essential hypertension  Continue antihypertensives #4 Type 2 diabetes  Slight scale insulin #5 atrial fibrillation  Rate control #6 chronic respiratory failure with hypoxia  and hypercapnia  BiPAP at night  DVT prophylaxis: On Eliquis Consultants: None Code Status: Full code Family Communication: Daughter in the room  Disposition Plan: Return home following observation   Levie Heritage, DO [facility] Pager [phone]  If 7PM-7AM, please contact night-coverage [email or web address] Password [email or web address]

## 2016-06-21 NOTE — ED Notes (Signed)
Pt is alert, pleasant. She request and is given the bedpan- Her daughter is at the bedside- She reports feeling better

## 2016-06-21 NOTE — Progress Notes (Signed)
Late entry:  Patient arrived to the floor, heart rate was initially in 50s. At approximately 2050, CCMD ( telemetry) called and stated that patient's heart rate had dropped in to the 20s.  Went to patient room, and patient stated that she felt her heart rate drop, and that she was " going out".  But patient heart rate quickly increased to the 50s after the initial drop. At 2100, patient heart rate decreased again, and patient again was symptomatic.  Notified mid-level, and was given order for atropine and to call back in 15 minutes to update him on patient.  Before atropine was given, patient had a 7 second pause, and atropine was given, Dr. Adrian BlackwaterStinson was again notified.  Received order to transfer patient to ICU. Notified patient and daughter of transfer.  Patient was transferred to ICU. Heart rate had increased to 50s at this time, blood pressure had remained stable. Hand off given to ICU nurse.

## 2016-06-21 NOTE — Progress Notes (Signed)
Notifed patient daughter of transfer to ICU. Upon transfer, patient alert and oriented and responding appropriately.. Patient heart rate currently in 50s, bp stable.  Report given to ICU nurse and patient stable upon transfer. All questions answered for daughter and patient.

## 2016-06-21 NOTE — ED Provider Notes (Signed)
[facility]-EMERGENCY DEPT Provider Note   CSN: [id number] Arrival date & time: 06/21/16  1642     History   Chief Complaint Chief Complaint  Patient presents with  . Loss of Consciousness    HPI [place] Zyn[place] is a 82 y.o. female.  Pt was started on digoxin about 3 months ago for her a.fib and then put on a beta blocker about 2 weeks ago.  The pt said that she has been having dizzy spells since the beta blocker was added.  The pt had a dizzy spell this morning and sat down and was ok.  She had another one where she passed out.  The pt said that she was with a family member at the time.  She remembers feeling dizzy, then waking up.  Family said she passed out.  EMS was called and they noted pt had a HR in the 30s.     Past Medical History:  Diagnosis Date  . A-fib (HCC)   . Asthma   . COPD (chronic obstructive pulmonary disease) (HCC)    Oxygen dependent  . Coronary atherosclerosis of native coronary artery   . Essential hypertension, benign   . History of GI bleed    ulcer  . Hyperlipidemia   . Old inferior wall myocardial infarction 1999   [facility]  . Type 2 diabetes mellitus [facility])     Patient Active Problem List   Diagnosis Date Noted  . Symptomatic bradycardia 06/21/2016  . Chronic respiratory failure with hypoxia and hypercapnia (HCC) 06/13/2016  . Diarrhea 04/07/2016  . Anemia 04/07/2016  . Localized edema 04/07/2016  . Combined congestive systolic and diastolic heart failure (HCC) 03/20/2016  . Palliative care encounter   . Goals of care, counseling/discussion   . Dyspnea   . Atrial fibrillation with RVR (HCC)   . Acute on chronic respiratory failure with hypoxia (HCC) 03/09/2016  . Atrial fibrillation (HCC) 03/09/2016  . Hyperlipidemia   . Type 2 diabetes mellitus (HCC) 12/26/2009  . HYPERLIPIDEMIA 12/26/2009  . Essential hypertension, benign 12/26/2009  . Coronary atherosclerosis of native coronary artery 12/26/2009  . COPD (chronic obstructive pulmonary  disease) (HCC) 12/26/2009    Past Surgical History:  Procedure Laterality Date  . Cesareen section    . CHOLECYSTECTOMY    . COLONOSCOPY  09/2004   RMR: For obscure GI bleed. Left-sided diverticula, otherwise normal ileocolonoscopy  . Coronary artery stent  1999   BMS x 3 RCA  ([facility]), PTCRA RC4  . ESOPHAGOGASTRODUODENOSCOPY  11 2005   RMR: For melena, normal exam.  . EXPLORATORY LAPAROTOMY    . GIVENS CAPSULE STUDY  08/2004   official report unavailable, but reported couple of AVMs, nonbleeding  . PARTIAL COLECTOMY  1977   blockage/gangrene? unclear if small bowel or colon  . THORACENTESIS Left 03/10/16   1 L transudative fluid    OB History    No data available       Home Medications    Prior to Admission medications   Medication Sig Start Date End Date Taking? Authorizing Provider  apixaban (ELIQUIS) 5 MG TABS tablet Take 1 tablet (5 mg total) by mouth 2 (two) times daily. 03/20/16  Yes Erick Blinks, MD  bisoprolol (ZEBETA) 5 MG tablet Take 1 tablet (5 mg total) by mouth daily. 06/12/16  Yes Nyoka Cowden, MD  digoxin (LANOXIN) 0.25 MG tablet Take 0.25 mg by mouth daily.   Yes Historical Provider, MD  escitalopram (LEXAPRO) 20 MG tablet Take 10 mg by mouth  daily.    Yes Historical Provider, MD  furosemide (LASIX) 40 MG tablet Take 40 mg by mouth daily. Daily at 0800 am   Yes Historical Provider, MD  ipratropium-albuterol (DUONEB) 0.5-2.5 (3) MG/3ML SOLN Take 3 mLs by nebulization 3 (three) times daily. Patient taking differently: Take 3 mLs by nebulization every 6 (six) hours as needed (for shortness of breath).  03/20/16  Yes Erick BlinksJehanzeb Memon, MD  LORazepam (ATIVAN) 0.5 MG tablet Take 1 tablet (0.5 mg total) by mouth daily. Take one tablet by mouth once daily Patient taking differently: Take 0.5 mg by mouth at bedtime. Take one tablet by mouth once daily 04/15/16  Yes Sharon SellerJessica K Eubanks, NP  Multiple Vitamins-Minerals (CENTRUM SILVER 50+WOMEN PO) Take 1 tablet by mouth daily.    Yes Historical Provider, MD  nitroGLYCERIN (NITROSTAT) 0.4 MG SL tablet Place 1 tablet (0.4 mg total) under the tongue every 5 (five) minutes as needed for chest pain. 04/24/14  Yes Jonelle SidleSamuel G McDowell, MD  NON FORMULARY Bipap qhs with IPAP of 20 and EPAP of 5. Tidal volume of 600 and respiratory rate of 16. Twice a day   Yes Historical Provider, MD  omeprazole (PRILOSEC) 20 MG capsule Take 1 capsule (20 mg total) by mouth 2 (two) times daily before a meal. 06/12/16  Yes Nyoka CowdenMichael B Wert, MD  OXYGEN Inhale 2.5 L into the lungs continuous.   Yes Historical Provider, MD  potassium chloride (K-DUR,KLOR-CON) 10 MEQ tablet Take 10 mEq by mouth 2 (two) times daily.   Yes Historical Provider, MD  vitamin B-12 (CYANOCOBALAMIN) 1000 MCG tablet Take 1,000 mcg by mouth daily.   Yes Historical Provider, MD    Family History Family History  Problem Relation Age of Onset  . Diabetes Mother   . Diabetes Father   . Heart disease Father   . Cancer Sister     unknown  . Cancer Brother     unknown  . Diabetes Brother   . Colon cancer Neg Hx     Social History Social History  Substance Use Topics  . Smoking status: Former Smoker    Packs/day: 2.00    Years: 40.00    Types: Cigarettes    Quit date: 01/13/1998  . Smokeless tobacco: Never Used  . Alcohol use No     Allergies   Codeine and Heparin   Review of Systems Review of Systems  Neurological: Positive for dizziness and syncope.  All other systems reviewed and are negative.    Physical Exam Updated Vital Signs BP (!) 108/43   Pulse (!) 53   Temp 97.6 F (36.4 C) (Oral)   Resp 20   Ht 5\' 4"  (1.626 m)   Wt 150 lb (68 kg)   SpO2 100%   BMI 25.75 kg/m   Physical Exam  Constitutional: She is oriented to person, place, and time. She appears well-developed and well-nourished.  HENT:  Head: Normocephalic and atraumatic.  Right Ear: External ear normal.  Left Ear: External ear normal.  Nose: Nose normal.  Mouth/Throat: Oropharynx is  clear and moist.  Eyes: Conjunctivae and EOM are normal. Pupils are equal, round, and reactive to light.  Neck: Normal range of motion. Neck supple.  Cardiovascular: Normal heart sounds and intact distal pulses.  An irregularly irregular rhythm present. Bradycardia present.   Pulmonary/Chest: Effort normal and breath sounds normal.  Abdominal: Soft. Bowel sounds are normal.  Musculoskeletal: Normal range of motion.  Neurological: She is alert and oriented to person, place, and time.  Skin: Skin is warm and dry. Capillary refill takes less than 2 seconds.  Psychiatric: She has a normal mood and affect. Her behavior is normal. Judgment and thought content normal.  Nursing note and vitals reviewed.    ED Treatments / Results  Labs (all labs ordered are listed, but only abnormal results are displayed) Labs Reviewed  CBC WITH DIFFERENTIAL/PLATELET - Abnormal; Notable for the following:       Result Value   Hemoglobin 11.0 (*)    HCT 35.7 (*)    All other components within normal limits  COMPREHENSIVE METABOLIC PANEL - Abnormal; Notable for the following:    Glucose, Bld 191 (*)    Total Protein 6.4 (*)    GFR calc non Af Amer 59 (*)    All other components within normal limits  URINALYSIS, ROUTINE W REFLEX MICROSCOPIC (NOT AT [facility]) - Abnormal; Notable for the following:    Nitrite POSITIVE (*)    Leukocytes, UA SMALL (*)    All other components within normal limits  DIGOXIN LEVEL - Abnormal; Notable for the following:    Digoxin Level 2.8 (*)    All other components within normal limits  URINE MICROSCOPIC-ADD ON - Abnormal; Notable for the following:    Squamous Epithelial / LPF 0-5 (*)    Bacteria, UA FEW (*)    All other components within normal limits  CBG MONITORING, ED - Abnormal; Notable for the following:    Glucose-Capillary 172 (*)    All other components within normal limits  PROTIME-INR  TROPONIN I    EKG  EKG Interpretation  Date/Time:  Saturday June 21 2016 16:48:12 EDT Ventricular Rate:  37 PR Interval:    QRS Duration: 145 QT Interval:  473 QTC Calculation: 371 R Axis:   94 Text Interpretation:  Atrial fibrillation RBBB and LPFB Inferior infarct, age indeterminate severe bradycardia with HR 37 Confirmed by Particia Nearing MD, Makalya Nave ([id number]) on 06/21/2016 5:02:01 PM       Radiology Dg Chest Port 1 View  Result Date: 06/21/2016 CLINICAL DATA:  Syncope.  Dizziness.  Bradycardia. EXAM: PORTABLE CHEST 1 VIEW COMPARISON:  06/12/2016 FINDINGS: Moderate cardiomegaly remains stable. Pulmonary hyperinflation is again noted. Mild scarring again noted in the left lung. No evidence of pulmonary consolidation or pneumothorax. IMPRESSION: Stable cardiomegaly and probable COPD.  No acute findings. Electronically Signed   By: Myles Rosenthal M.D.   On: 06/21/2016 17:21    Procedures Procedures (including critical care time)  Medications Ordered in ED Medications  sodium chloride 0.9 % bolus 1,000 mL (0 mLs Intravenous Stopped 06/21/16 1826)    And  0.9 %  sodium chloride infusion (not administered)  atropine 1 MG/10ML injection 0.5 mg (0.5 mg Intravenous Given 06/21/16 1709)     Initial Impression / Assessment and Plan / ED Course  I have reviewed the triage vital signs and the nursing notes.  Pertinent labs & imaging results that were available during my care of the patient were reviewed by me and considered in my medical decision making (see chart for details).  Clinical Course    Pt's hr increased to 50s after 1/2 amp atropine.  She is feeling better with that HR.  She is dig toxic, but she has no life threatening arrhythmias and hr has improved, so I did not order digibind.  Pt d/w Dr. Adrian Blackwater for admission.  Final Clinical Impressions(s) / ED Diagnoses   Final diagnoses:  Digitalis toxicity, accidental or unintentional, initial encounter  Syncope, unspecified  syncope type  Symptomatic bradycardia    New Prescriptions New Prescriptions   No  medications on file     Jacalyn Lefevre, MD 06/21/16 [phone]

## 2016-06-21 NOTE — ED Notes (Signed)
Dr Adrian BlackwaterStinson at bedside with family

## 2016-06-21 NOTE — ED Triage Notes (Signed)
Pt is on 2.5 L oxygen at home.

## 2016-06-21 NOTE — ED Triage Notes (Signed)
Pt comes in by EMS for a unwitnessed fall. States she doesn't remember falling, only waking up. She then had an episode of dizziness then she called EMS. Pt alert and oriented at this time.   HR 35-40 per EMS, pt recently placed on digoxin.

## 2016-06-22 DIAGNOSIS — I110 Hypertensive heart disease with heart failure: Secondary | ICD-10-CM | POA: Diagnosis not present

## 2016-06-22 DIAGNOSIS — I5042 Chronic combined systolic (congestive) and diastolic (congestive) heart failure: Secondary | ICD-10-CM | POA: Diagnosis not present

## 2016-06-22 DIAGNOSIS — I495 Sick sinus syndrome: Secondary | ICD-10-CM | POA: Diagnosis not present

## 2016-06-22 DIAGNOSIS — J449 Chronic obstructive pulmonary disease, unspecified: Secondary | ICD-10-CM | POA: Diagnosis present

## 2016-06-22 DIAGNOSIS — E785 Hyperlipidemia, unspecified: Secondary | ICD-10-CM | POA: Diagnosis not present

## 2016-06-22 DIAGNOSIS — I481 Persistent atrial fibrillation: Secondary | ICD-10-CM | POA: Diagnosis not present

## 2016-06-22 DIAGNOSIS — Z833 Family history of diabetes mellitus: Secondary | ICD-10-CM | POA: Diagnosis not present

## 2016-06-22 DIAGNOSIS — N390 Urinary tract infection, site not specified: Secondary | ICD-10-CM | POA: Diagnosis not present

## 2016-06-22 DIAGNOSIS — I959 Hypotension, unspecified: Secondary | ICD-10-CM | POA: Diagnosis not present

## 2016-06-22 DIAGNOSIS — Z8249 Family history of ischemic heart disease and other diseases of the circulatory system: Secondary | ICD-10-CM | POA: Diagnosis not present

## 2016-06-22 DIAGNOSIS — Z87891 Personal history of nicotine dependence: Secondary | ICD-10-CM | POA: Diagnosis not present

## 2016-06-22 DIAGNOSIS — R001 Bradycardia, unspecified: Secondary | ICD-10-CM | POA: Diagnosis not present

## 2016-06-22 DIAGNOSIS — I10 Essential (primary) hypertension: Secondary | ICD-10-CM | POA: Diagnosis not present

## 2016-06-22 DIAGNOSIS — J9611 Chronic respiratory failure with hypoxia: Secondary | ICD-10-CM | POA: Diagnosis not present

## 2016-06-22 DIAGNOSIS — Z7901 Long term (current) use of anticoagulants: Secondary | ICD-10-CM | POA: Diagnosis not present

## 2016-06-22 DIAGNOSIS — Z950 Presence of cardiac pacemaker: Secondary | ICD-10-CM | POA: Diagnosis not present

## 2016-06-22 DIAGNOSIS — Z955 Presence of coronary angioplasty implant and graft: Secondary | ICD-10-CM | POA: Diagnosis not present

## 2016-06-22 DIAGNOSIS — T460X1D Poisoning by cardiac-stimulant glycosides and drugs of similar action, accidental (unintentional), subsequent encounter: Secondary | ICD-10-CM | POA: Diagnosis not present

## 2016-06-22 DIAGNOSIS — R2681 Unsteadiness on feet: Secondary | ICD-10-CM | POA: Diagnosis present

## 2016-06-22 DIAGNOSIS — I252 Old myocardial infarction: Secondary | ICD-10-CM | POA: Diagnosis not present

## 2016-06-22 DIAGNOSIS — J9612 Chronic respiratory failure with hypercapnia: Secondary | ICD-10-CM | POA: Diagnosis not present

## 2016-06-22 DIAGNOSIS — Z885 Allergy status to narcotic agent status: Secondary | ICD-10-CM | POA: Diagnosis not present

## 2016-06-22 DIAGNOSIS — Z9981 Dependence on supplemental oxygen: Secondary | ICD-10-CM | POA: Diagnosis not present

## 2016-06-22 DIAGNOSIS — R55 Syncope and collapse: Secondary | ICD-10-CM | POA: Diagnosis not present

## 2016-06-22 DIAGNOSIS — Z9049 Acquired absence of other specified parts of digestive tract: Secondary | ICD-10-CM | POA: Diagnosis not present

## 2016-06-22 DIAGNOSIS — T460X1A Poisoning by cardiac-stimulant glycosides and drugs of similar action, accidental (unintentional), initial encounter: Secondary | ICD-10-CM | POA: Diagnosis not present

## 2016-06-22 DIAGNOSIS — Z79899 Other long term (current) drug therapy: Secondary | ICD-10-CM | POA: Diagnosis not present

## 2016-06-22 DIAGNOSIS — I2510 Atherosclerotic heart disease of native coronary artery without angina pectoris: Secondary | ICD-10-CM | POA: Diagnosis not present

## 2016-06-22 DIAGNOSIS — E1165 Type 2 diabetes mellitus with hyperglycemia: Secondary | ICD-10-CM | POA: Diagnosis not present

## 2016-06-22 LAB — BASIC METABOLIC PANEL
ANION GAP: 9 (ref 5–15)
BUN: 14 mg/dL (ref 6–20)
CALCIUM: 8.8 mg/dL — AB (ref 8.9–10.3)
CO2: 27 mmol/L (ref 22–32)
Chloride: 104 mmol/L (ref 101–111)
Creatinine, Ser: 0.75 mg/dL (ref 0.44–1.00)
Glucose, Bld: 180 mg/dL — ABNORMAL HIGH (ref 65–99)
POTASSIUM: 4.4 mmol/L (ref 3.5–5.1)
Sodium: 140 mmol/L (ref 135–145)

## 2016-06-22 LAB — TROPONIN I: Troponin I: <0.03 ng/mL (ref <0.03)

## 2016-06-22 LAB — GLUCOSE, CAPILLARY
GLUCOSE-CAPILLARY: 161 mg/dL — AB (ref 65–99)
Glucose-Capillary: 161 mg/dL — ABNORMAL HIGH (ref 65–99)
Glucose-Capillary: 143 mg/dL — ABNORMAL HIGH (ref 65–99)
Glucose-Capillary: 140 mg/dL — ABNORMAL HIGH (ref 65–99)

## 2016-06-22 LAB — DIGOXIN LEVEL
DIGOXIN LVL: 2.2 ng/mL — AB (ref 0.8–2.0)
Digoxin Level: 2 ng/mL (ref 0.8–2.0)

## 2016-06-22 LAB — MRSA PCR SCREENING: MRSA BY PCR: NEGATIVE

## 2016-06-22 MED ORDER — DOPAMINE-DEXTROSE 3.2-5 MG/ML-% IV SOLN
INTRAVENOUS | Status: AC
  Filled 2016-06-22: qty 250

## 2016-06-22 MED ORDER — ONDANSETRON HCL 4 MG/2ML IJ SOLN
4.0000 mg | Freq: Three times a day (TID) | INTRAMUSCULAR | Status: DC | PRN
  Administered 2016-06-22 (×2): 4 mg via INTRAVENOUS
  Filled 2016-06-22 (×3): qty 2

## 2016-06-22 MED ORDER — DOPAMINE-DEXTROSE 3.2-5 MG/ML-% IV SOLN
5.0000 ug/kg/min | INTRAVENOUS | Status: DC
Start: 2016-06-22 — End: 2016-06-22
  Administered 2016-06-22: 5 ug/kg/min via INTRAVENOUS

## 2016-06-22 MED ORDER — PROMETHAZINE HCL 25 MG/ML IJ SOLN
12.5000 mg | Freq: Four times a day (QID) | INTRAMUSCULAR | Status: DC | PRN
  Administered 2016-06-22: 12.5 mg via INTRAVENOUS
  Filled 2016-06-22: qty 1

## 2016-06-22 MED ORDER — ATROPINE SULFATE 1 MG/ML IJ SOLN
INTRAMUSCULAR | Status: AC
  Administered 2016-06-22: 0.5 mg
  Filled 2016-06-22: qty 1

## 2016-06-22 MED ORDER — ONDANSETRON HCL 4 MG/2ML IJ SOLN
4.0000 mg | Freq: Three times a day (TID) | INTRAMUSCULAR | Status: DC
  Administered 2016-06-22: 4 mg via INTRAVENOUS

## 2016-06-22 MED ORDER — ONDANSETRON HCL 4 MG/2ML IJ SOLN
INTRAMUSCULAR | Status: AC
  Administered 2016-06-22: 4 mg via INTRAVENOUS
  Filled 2016-06-22: qty 2

## 2016-06-22 MED ORDER — DOPAMINE-DEXTROSE 3.2-5 MG/ML-% IV SOLN
5.0000 ug/kg/min | INTRAVENOUS | Status: DC
  Administered 2016-06-22: 15 ug/kg/min via INTRAVENOUS
  Administered 2016-06-23: 15.128 ug/kg/min via INTRAVENOUS
  Administered 2016-06-23 (×2): 15 ug/kg/min via INTRAVENOUS
  Administered 2016-06-23: 12.5 ug/kg/min via INTRAVENOUS
  Administered 2016-06-24: 10 ug/kg/min via INTRAVENOUS
  Administered 2016-06-26: 8 ug/kg/min via INTRAVENOUS
  Filled 2016-06-22 (×5): qty 250

## 2016-06-22 NOTE — Progress Notes (Signed)
Sr Actorchnorr paged for a foley per pt request . Pt eating small bites of sherbert. zofran gien r/t nausea with 2 small bites.

## 2016-06-22 NOTE — Progress Notes (Signed)
MD orders noted. Foley placed with thick milky pale yellow urine noted. ua and cx  Ordered and sent to lab. Pt given bath and complete linen change done. Phenegran given . Pt now resting with eyes closed , arouses easily and " feels so much better having my ivs changed, and medicine and the foley ".

## 2016-06-22 NOTE — Progress Notes (Signed)
PROGRESS NOTE    [name]  ZOX:[id number] DOB: 12/17/1930 DOA: 06/21/2016 PCP: [name], MD    Brief Narrative: patient admitted with syncope, having bradycardia to 30's, with hx of afib RVR on Eliquis, Dig and recently changed Coreg to Bisoprolol.  She responded to atropine, and is on Dopamine drip.  Her Dig was at 2.8, and it was held.  Today its 2.0.  She is feeling better.  No other complaints.    Assessment & Plan:   Principal Problem:   Symptomatic bradycardia Active Problems:   Type 2 diabetes mellitus (HCC)   Essential hypertension, benign   Atrial fibrillation (HCC)   Chronic respiratory failure with hypoxia and hypercapnia (HCC)   Digoxin toxicity   1. Dig Toxicity:  I will d/c digitalis.  Monitor her on telemetry.  If her HR is better, will d/c her home tomorrow.  I don't think Dig would be a good cardiac drug for her anyhow.  Will resume her Coreg pending her HR and BP tomorrow.  Today, will d/c Dopamine and transfer her to telemetry.  She is a FULL CODE.   2/  Afib:  Will consider Coreg.  Continue with anticogulation.  3/  Chronic respiratory failure with hypoxia and hypercapnea:  She is stable.  I had used Diamox on her previous admission.  4/   DM:  Stable will follow.   DVT prophylaxis: Eliquis.  Code Status: FULL CODE.  Family Communication: None.  Disposition Plan: To home when appropriate.   Consultants:   None.   Procedures: None.   Antimicrobials: Anti-infectives    None       Subjective:   Objective: Vitals:   06/22/16 0615 06/22/16 0630 06/22/16 0645 06/22/16 0718  BP: (!) 126/45 (!) 134/52 (!) 131/48 (!) 132/53  Pulse: (!) 55 (!) 58 (!) 58 64  Resp: 20 20 15 19   Temp:    97 F (36.1 C)  TempSrc:    Oral  SpO2: 96% 96% 99% 97%  Weight:      Height:        Intake/Output Summary (Last 24 hours) at 06/22/16 0816 Last data filed at 06/22/16 0732  Gross per 24 hour  Intake          1906.22 ml  Output             3450 ml    Net         -1543.78 ml   Filed Weights   06/21/16 1644 06/21/16 2021 06/22/16 0500  Weight: 68 kg (150 lb) 69.1 kg (152 lb 5.4 oz) 69.6 kg (153 lb 7 oz)    Examination:  General exam: Appears calm and comfortable  Respiratory system: Clear to auscultation. Respiratory effort normal. Cardiovascular system: S1 & S2 heard, RRR. No JVD, murmurs, rubs, gallops or clicks. No pedal edema. Gastrointestinal system: Abdomen is nondistended, soft and nontender. No organomegaly or masses felt. Normal bowel sounds heard. Central nervous system: Alert and oriented. No focal neurological deficits. Extremities: Symmetric 5 x 5 power. Skin: No rashes, lesions or ulcers Psychiatry: Judgement and insight appear normal. Mood & affect appropriate.   Data Reviewed: I have personally reviewed following labs and imaging studies  CBC:  Recent Labs Lab 06/21/16 1654  WBC 6.9  NEUTROABS 4.4  HGB 11.0*  HCT 35.7*  MCV 90.8  PLT 166   Basic Metabolic Panel:  Recent Labs Lab 06/21/16 1654 06/22/16 0432  NA 139 140  K 4.8 4.4  CL 101 104  CO2 32 27  GLUCOSE 191* 180*  BUN 19 14  CREATININE 0.88 0.75  CALCIUM 9.0 8.8*   GFR: Estimated Creatinine Clearance: 51.1 mL/min (by C-G formula based on SCr of 0.8 mg/dL). Liver Function Tests:  Recent Labs Lab 06/21/16 1654  AST 15  ALT 15  ALKPHOS 59  BILITOT 0.8  PROT 6.4*  ALBUMIN 3.7   Coagulation Profile:  Recent Labs Lab 06/21/16 1654  INR 1.19   Cardiac Enzymes:  Recent Labs Lab 06/21/16 1654 06/22/16 0253  TROPONINI <0.03 <0.03   BNP (last 3 results)  Recent Labs  06/12/16 1049  PROBNP 358.0*   HbA1C: No results for input(s): HGBA1C in the last 72 hours. CBG:  Recent Labs Lab 06/21/16 1647 06/21/16 2214 06/22/16 0723  GLUCAP 172* 104* 161*    Recent Results (from the past 240 hour(s))  MRSA PCR Screening     Status: None   Collection Time: 06/21/16  9:50 PM  Result Value Ref Range Status   MRSA by  PCR NEGATIVE NEGATIVE Final    Comment:        The GeneXpert MRSA Assay (FDA approved for NASAL specimens only), is one component of a comprehensive MRSA colonization surveillance program. It is not intended to diagnose MRSA infection nor to guide or monitor treatment for MRSA infections.      Radiology Studies: Ct Head Wo Contrast  Result Date: 06/21/2016 CLINICAL DATA:  Unwitnessed fall. Syncope. Dizziness. History of diabetes, hypertension, coronary artery disease. EXAM: CT HEAD WITHOUT CONTRAST TECHNIQUE: Contiguous axial images were obtained from the base of the skull through the vertex without intravenous contrast. COMPARISON:  None. FINDINGS: Brain: Diffuse cerebral atrophy. Mild ventricular dilatation consistent with central atrophy. Patchy low-attenuation changes in the deep white matter consistent with small vessel ischemia. No mass effect or midline shift. No abnormal extra-axial fluid collections. Gray-white matter junctions are distinct. Basal cisterns are not effaced. No evidence of acute intracranial hemorrhage. Vascular: No hyperdense vessel or unexpected calcification. Skull: No depressed skull fractures. Sinuses/Orbits: Mucosal thickening in the paranasal sinuses. Opacification of the left sphenoid sinus. Mastoid air cells are not opacified. Other: None. IMPRESSION: No acute intracranial abnormalities. Mild chronic atrophy and small vessel ischemic changes. Electronically Signed   By: Burman NievesWilliam  Stevens M.D.   On: 06/21/2016 18:54   Dg Chest Port 1 View  Result Date: 06/21/2016 CLINICAL DATA:  Syncope.  Dizziness.  Bradycardia. EXAM: PORTABLE CHEST 1 VIEW COMPARISON:  06/12/2016 FINDINGS: Moderate cardiomegaly remains stable. Pulmonary hyperinflation is again noted. Mild scarring again noted in the left lung. No evidence of pulmonary consolidation or pneumothorax. IMPRESSION: Stable cardiomegaly and probable COPD.  No acute findings. Electronically Signed   By: Myles RosenthalJohn  Stahl M.D.    On: 06/21/2016 17:21    Scheduled Meds: . apixaban  5 mg Oral BID  . atropine      . escitalopram  10 mg Oral Daily  . furosemide  40 mg Oral Daily  . insulin aspart  0-15 Units Subcutaneous TID WC  . insulin aspart  0-5 Units Subcutaneous QHS  . LORazepam  0.5 mg Oral QHS  . pantoprazole  40 mg Oral Daily   Continuous Infusions: . DOPamine 10 mcg/kg/min (06/22/16 0801)     LOS: 0 days   Tawna Alwin, MD FACP Hospitalist.   If 7PM-7AM, please contact night-coverage [email or web address] Password [email or web address] 06/22/2016, 8:16 AM

## 2016-06-22 NOTE — Progress Notes (Signed)
Paging down MD cell called  Rings x several x 2 tries . Texted per rn cell to call icu. Pt now dry heaving , needing foley and additional meds for vomiting

## 2016-06-22 NOTE — Progress Notes (Signed)
Patient continues to well on 2 lpm [place] with saturations of 98%. Patient has been attempting to wean herself from wearing the BIPAP. Patient will continue to wear her nasal cannula at this time; BIPAP is on standby.

## 2016-06-22 NOTE — Progress Notes (Signed)
Patient's heart rate dropped to 29 bpm then rebounded to a constant rate of 34 bpm. MD notified. Dopamine restarted. Heart rate increased to 55 bpm. Canceled transfer at this time.

## 2016-06-22 NOTE — Progress Notes (Signed)
eLink Physician-Brief Progress Note Patient Name: Kathy CuriaVirginia L Matsuura DOB: 05/08/1931 MRN: [id number]   Date of Service  06/22/2016  HPI/Events of Note  RN Calls as she was concerned about the patient.  Patient admitted for symptomatic bradycardia related to medication.  Patient seen, comfortable. Not in distress. No subjective complaints.  Blood pressure 139/50, heart rate 49, respiratory rate 18, sats 100% on 2 L.  The nurse had given 0.5 mg atropine, IV, 2 doses already for heartrate in the 30s to 40s. Patient responded with heart rate increasing in the 50s.  Labs reviewed.   eICU Interventions  Start dopamine drip, 5-10 mcg/kg/m, titrate to keep heart rate more than 60. Told the nurse to give us a call if she would require higher doses of dopamine. Told nurse also to give us a call if she ends up being tachycardic, hypertensive.  Plan discussed with patient.      Intervention Category Intermediate Interventions: Other:  Daneen SchickJose Angelo A De Dios 06/22/2016, 1:02 AM

## 2016-06-23 DIAGNOSIS — T460X1D Poisoning by cardiac-stimulant glycosides and drugs of similar action, accidental (unintentional), subsequent encounter: Secondary | ICD-10-CM

## 2016-06-23 LAB — GLUCOSE, CAPILLARY
GLUCOSE-CAPILLARY: 155 mg/dL — AB (ref 65–99)
GLUCOSE-CAPILLARY: 136 mg/dL — AB (ref 65–99)
GLUCOSE-CAPILLARY: 203 mg/dL — AB (ref 65–99)
Glucose-Capillary: 127 mg/dL — ABNORMAL HIGH (ref 65–99)

## 2016-06-23 LAB — URINALYSIS, ROUTINE W REFLEX MICROSCOPIC
Bilirubin Urine: NEGATIVE
Glucose, UA: NEGATIVE mg/dL
Ketones, ur: NEGATIVE mg/dL
Nitrite: POSITIVE — AB
Specific Gravity, Urine: 1.01 (ref 1.005–1.030)
pH: 6 (ref 5.0–8.0)

## 2016-06-23 LAB — URINE MICROSCOPIC-ADD ON: Squamous Epithelial / LPF: NONE SEEN

## 2016-06-23 NOTE — Progress Notes (Signed)
PROGRESS NOTE    [name]  AVW:[id number] DOB: [birth year] DOA: 06/21/2016 PCP: [name], MD   Brief Narrative: patient admitted with syncope, having bradycardia to 30's, with hx of afib RVR on Eliquis, Dig and recently changed Coreg to Bisoprolol.  She responded to atropine, and is on Dopamine drip.  Her Dig was at 2.8, and it was held.  Dig level came down to 2.0.  She is feeling better.  No other complaints. Dopamine was not able to be weaned yesterday, as she got brady to 30 again.    Assessment & Plan:   Principal Problem:   Symptomatic bradycardia Active Problems:   Type 2 diabetes mellitus (HCC)   Essential hypertension, benign   Atrial fibrillation (HCC)   Chronic respiratory failure with hypoxia and hypercapnia (HCC)   Digoxin toxicity   1. Dig Toxicity:  I will d/c digitalis.  Monitor her on telemetry. Will attempt to wean of Dopamine again today. Can keep in ICU x 24 hours.   I don't think Dig would be a good cardiac drug for her anyhow.  Will resume her Coreg pending her HR and BP tomorrow.  She is a FULL CODE.   2/  Afib:  Will consider Coreg if required. Continue with anticogulation.  3/  Chronic respiratory failure with hypoxia and hypercapnea:  She is stable.  I had used Diamox on her previous admission.  4/   DM:  Stable will follow.   DVT prophylaxis: Eliquis.  Code Status: FULL CODE.  Family Communication: None.  Disposition Plan: To home when appropriate.   Consultants:   None.   Procedures: None.   Antimicrobials:     Anti-infectives    None     Subjective:  No complaints.   Objective: Vitals:   06/23/16 0400 06/23/16 0500 06/23/16 0600 06/23/16 0700  BP: (!) 135/49 (!) 150/57 (!) 148/57 (!) 147/58  Pulse: (!) 48 60 62 63  Resp: 19 18 18  (!) 21  Temp: 97.3 F (36.3 C)     TempSrc: Oral     SpO2: 100% 100% 100% 100%  Weight:  66.3 kg (146 lb 2.6 oz)    Height:        Intake/Output Summary (Last 24 hours) at  06/23/16 0754 Last data filed at 06/23/16 0500  Gross per 24 hour  Intake              360 ml  Output             2450 ml  Net            -2090 ml   Filed Weights   06/21/16 2021 06/22/16 0500 06/23/16 0500  Weight: 69.1 kg (152 lb 5.4 oz) 69.6 kg (153 lb 7 oz) 66.3 kg (146 lb 2.6 oz)    Examination:  General exam: Appears calm and comfortable  Respiratory system: Clear to auscultation. Respiratory effort normal. Cardiovascular system: S1 & S2 heard, RRR. No JVD, murmurs, rubs, gallops or clicks. No pedal edema. Gastrointestinal system: Abdomen is nondistended, soft and nontender. No organomegaly or masses felt. Normal bowel sounds heard. Central nervous system: Alert and oriented. No focal neurological deficits. Extremities: Symmetric 5 x 5 power. Skin: No rashes, lesions or ulcers Psychiatry: Judgement and insight appear normal. Mood & affect appropriate.   Data Reviewed: I have personally reviewed following labs and imaging studies  CBC:  Recent Labs Lab 06/21/16 1654  WBC 6.9  NEUTROABS 4.4  HGB 11.0*  HCT 35.7*  MCV 90.8  PLT 166   Basic Metabolic Panel:  Recent Labs Lab 06/21/16 1654 06/22/16 0432  NA 139 140  K 4.8 4.4  CL 101 104  CO2 32 27  GLUCOSE 191* 180*  BUN 19 14  CREATININE 0.88 0.75  CALCIUM 9.0 8.8*   GFR: Estimated Creatinine Clearance: 49.9 mL/min (by C-G formula based on SCr of 0.8 mg/dL). Liver Function Tests:  Recent Labs Lab 06/21/16 1654  AST 15  ALT 15  ALKPHOS 59  BILITOT 0.8  PROT 6.4*  ALBUMIN 3.7   Coagulation Profile:  Recent Labs Lab 06/21/16 1654  INR 1.19   Cardiac Enzymes:  Recent Labs Lab 06/21/16 1654 06/22/16 0253  TROPONINI <0.03 <0.03   BNP (last 3 results)  Recent Labs  06/12/16 1049  PROBNP 358.0*   CBG:  Recent Labs Lab 06/22/16 0723 06/22/16 1118 06/22/16 1617 06/22/16 2131 06/23/16 0729  GLUCAP 161* 143* 161* 140* 155*    Recent Results (from the past 240 hour(s))  MRSA  PCR Screening     Status: None   Collection Time: 06/21/16  9:50 PM  Result Value Ref Range Status   MRSA by PCR NEGATIVE NEGATIVE Final    Comment:        The GeneXpert MRSA Assay (FDA approved for NASAL specimens only), is one component of a comprehensive MRSA colonization surveillance program. It is not intended to diagnose MRSA infection nor to guide or monitor treatment for MRSA infections.      Radiology Studies: Ct Head Wo Contrast  Result Date: 06/21/2016 CLINICAL DATA:  Unwitnessed fall. Syncope. Dizziness. History of diabetes, hypertension, coronary artery disease. EXAM: CT HEAD WITHOUT CONTRAST TECHNIQUE: Contiguous axial images were obtained from the base of the skull through the vertex without intravenous contrast. COMPARISON:  None. FINDINGS: Brain: Diffuse cerebral atrophy. Mild ventricular dilatation consistent with central atrophy. Patchy low-attenuation changes in the deep white matter consistent with small vessel ischemia. No mass effect or midline shift. No abnormal extra-axial fluid collections. Gray-white matter junctions are distinct. Basal cisterns are not effaced. No evidence of acute intracranial hemorrhage. Vascular: No hyperdense vessel or unexpected calcification. Skull: No depressed skull fractures. Sinuses/Orbits: Mucosal thickening in the paranasal sinuses. Opacification of the left sphenoid sinus. Mastoid air cells are not opacified. Other: None. IMPRESSION: No acute intracranial abnormalities. Mild chronic atrophy and small vessel ischemic changes. Electronically Signed   By: Burman NievesWilliam  Stevens M.D.   On: 06/21/2016 18:54   Dg Chest Port 1 View  Result Date: 06/21/2016 CLINICAL DATA:  Syncope.  Dizziness.  Bradycardia. EXAM: PORTABLE CHEST 1 VIEW COMPARISON:  06/12/2016 FINDINGS: Moderate cardiomegaly remains stable. Pulmonary hyperinflation is again noted. Mild scarring again noted in the left lung. No evidence of pulmonary consolidation or pneumothorax.  IMPRESSION: Stable cardiomegaly and probable COPD.  No acute findings. Electronically Signed   By: Myles RosenthalJohn  Stahl M.D.   On: 06/21/2016 17:21    Scheduled Meds: . apixaban  5 mg Oral BID  . escitalopram  10 mg Oral Daily  . furosemide  40 mg Oral Daily  . insulin aspart  0-15 Units Subcutaneous TID WC  . insulin aspart  0-5 Units Subcutaneous QHS  . LORazepam  0.5 mg Oral QHS  . pantoprazole  40 mg Oral Daily   Continuous Infusions: . DOPamine 15.128 mcg/kg/min (06/23/16 0543)     LOS: 1 day   Mischele Detter, MD FACP Hospitalist.   If 7PM-7AM, please contact night-coverage [email or web address] Password [email or web address] 06/23/2016, 7:54 AM

## 2016-06-23 NOTE — Progress Notes (Signed)
Pt crawled out foot of bed standing with steady gait forgot she had a foley and had to pee. Placed back in bed, all bed alarms replaced. Pt had disconnected her monitor but foley and iv intact.

## 2016-06-23 NOTE — Progress Notes (Signed)
After attempting wean dopamine, had to increase from 211mcg/kg/min to 5112mcg/kg/min because heart rate was staying 38-40 bpm. Dr Conley RollsLe informed. We will wait until tomorrow and try to wean again. No new orders

## 2016-06-23 NOTE — Plan of Care (Signed)
Problem: Nutrition: Goal: Adequate nutrition will be maintained Outcome: Progressing Tolerating diet without n/v  Problem: Bowel/Gastric: Goal: Will not experience complications related to bowel motility Outcome: Progressing Nausea resolved. BM today

## 2016-06-23 NOTE — Progress Notes (Signed)
Pt 100% on 3lpm cann..in no distress BIPAP not needed will continue to monitor

## 2016-06-24 DIAGNOSIS — T460X1A Poisoning by cardiac-stimulant glycosides and drugs of similar action, accidental (unintentional), initial encounter: Secondary | ICD-10-CM

## 2016-06-24 LAB — GLUCOSE, CAPILLARY
GLUCOSE-CAPILLARY: 113 mg/dL — AB (ref 65–99)
Glucose-Capillary: 209 mg/dL — ABNORMAL HIGH (ref 65–99)
Glucose-Capillary: 166 mg/dL — ABNORMAL HIGH (ref 65–99)
Glucose-Capillary: 201 mg/dL — ABNORMAL HIGH (ref 65–99)

## 2016-06-24 MED ORDER — SODIUM CHLORIDE 0.9 % IV BOLUS (SEPSIS)
1000.0000 mL | Freq: Once | INTRAVENOUS | Status: AC
  Administered 2016-06-24: 1000 mL via INTRAVENOUS

## 2016-06-24 NOTE — Progress Notes (Deleted)
Cardiology Office Note  Date: 06/24/2016   ID: NIOKA THORINGTON, DOB [birth year], MRN [id number]  PCP: [name], MD  Primary Cardiologist: Nona Dell, MD   No chief complaint on file.   History of Present Illness: [name] is an 82 y.o. female last seen in May.  At the prior visit we continued conservative follow-up on medical therapy, no changes made in regimen.  Past Medical History:  Diagnosis Date  . A-fib (HCC)   . Asthma   . COPD (chronic obstructive pulmonary disease) (HCC)    Oxygen dependent  . Coronary atherosclerosis of native coronary artery   . Essential hypertension, benign   . History of GI bleed    ulcer  . Hyperlipidemia   . Old inferior wall myocardial infarction 1999   [facility]  . Type 2 diabetes mellitus (HCC)     Past Surgical History:  Procedure Laterality Date  . Cesareen section    . CHOLECYSTECTOMY    . COLONOSCOPY  09/2004   RMR: For obscure GI bleed. Left-sided diverticula, otherwise normal ileocolonoscopy  . Coronary artery stent  1999   BMS x 3 RCA  ([facility]), PTCRA RC4  . ESOPHAGOGASTRODUODENOSCOPY  11 2005   RMR: For melena, normal exam.  . EXPLORATORY LAPAROTOMY    . GIVENS CAPSULE STUDY  08/2004   official report unavailable, but reported couple of AVMs, nonbleeding  . PARTIAL COLECTOMY  1977   blockage/gangrene? unclear if small bowel or colon  . THORACENTESIS Left 03/10/16   1 L transudative fluid    No current facility-administered medications for this visit.    No current outpatient prescriptions on file.   Facility-Administered Medications Ordered in Other Visits  Medication Dose Route Frequency Provider Last Rate Last Dose  . acetaminophen (TYLENOL) tablet 650 mg  650 mg Oral Q6H PRN Rhona Raider Stinson, DO   650 mg at 06/23/16 1956   Or  . acetaminophen (TYLENOL) suppository 650 mg  650 mg Rectal Q6H PRN Levie Heritage, DO      . apixaban Everlene Balls) tablet 5 mg  5 mg Oral BID Rhona Raider Stinson, DO   5 mg at  06/24/16 1047  . DOPamine (INTROPIN) 800 mg in dextrose 5 % 250 mL (3.2 mg/mL) infusion  5 mcg/kg/min Intravenous Continuous Houston Siren, MD   5 mcg/kg/min at 06/24/16 1300  . escitalopram (LEXAPRO) tablet 10 mg  10 mg Oral Daily Rhona Raider Stinson, DO   10 mg at 06/24/16 1046  . furosemide (LASIX) tablet 40 mg  40 mg Oral Daily Rhona Raider Stinson, DO   40 mg at 06/24/16 1046  . insulin aspart (novoLOG) injection 0-15 Units  0-15 Units Subcutaneous TID WC Levie Heritage, DO   3 Units at 06/24/16 1232  . insulin aspart (novoLOG) injection 0-5 Units  0-5 Units Subcutaneous QHS Levie Heritage, DO   2 Units at 06/23/16 2226  . ipratropium-albuterol (DUONEB) 0.5-2.5 (3) MG/3ML nebulizer solution 3 mL  3 mL Nebulization Q6H PRN Levie Heritage, DO      . LORazepam (ATIVAN) tablet 0.5 mg  0.5 mg Oral QHS Rhona Raider Stinson, DO   0.5 mg at 06/23/16 2107  . ondansetron (ZOFRAN) injection 4 mg  4 mg Intravenous Q8H PRN Haydee Monica, MD   4 mg at 06/22/16 2015  . pantoprazole (PROTONIX) EC tablet 40 mg  40 mg Oral Daily Rhona Raider Stinson, DO   40 mg at 06/24/16 1046  . promethazine (PHENERGAN)  injection 12.5 mg  12.5 mg Intravenous Q6H PRN Leanne Chang, NP   12.5 mg at 06/22/16 2118   Allergies:  Codeine and Heparin   Social History: The patient  reports that she quit smoking about 18 years ago. Her smoking use included Cigarettes. She has a 80.00 pack-year smoking history. She has never used smokeless tobacco. She reports that she does not drink alcohol or use drugs.   Family History: The patient's family history includes Cancer in her brother and sister; Diabetes in her brother, father, and mother; Heart disease in her father.   ROS:  Please see the history of present illness. Otherwise, complete review of systems is positive for {NONE DEFAULTED:18576::"none"}.  All other systems are reviewed and negative.   Physical Exam: VS:  There were no vitals taken for this visit., BMI There is no height or weight on  file to calculate BMI.  Wt Readings from Last 3 Encounters:  06/24/16 145 lb 15.1 oz (66.2 kg)  06/12/16 149 lb (67.6 kg)  06/04/16 149 lb 12.8 oz (67.9 kg)    Chronically ill-appearing elderly woman in no active distress, wearing oxygen via nasal cannula. HEENT: Conjunctiva and lids normal, oropharynx clear. Neck: Supple, no elevated JVP or carotid bruits, no thyromegaly. Lungs: Decreased breath sounds without wheezes, nonlabored breathing at rest. Cardiac: Irregularly irregular, distant, no S3 or significant systolic murmur, no pericardial rub. Abdomen: Soft, nontender, bowel sounds present. Extremities: No pitting edema, distal pulses 2+. Skin: Warm and dry. Musculoskeletal: No kyphosis. Neuropsychiatric: Alert and oriented x3, affect grossly appropriate.  ECG: I personally reviewed the tracing from 06/21/2016 which showed slow atrial fibrillation with right bundle branch block and left posterior fascicular block, possible old inferior infarct pattern.   Recent Labwork: 03/10/2016: B Natriuretic Peptide 393.0 03/14/2016: Magnesium 1.9 06/12/2016: Pro B Natriuretic peptide (BNP) 358.0; TSH 2.65 06/21/2016: ALT 15; AST 15; Hemoglobin 11.0; Platelets 166 06/22/2016: BUN 14; Creatinine, Ser 0.75; Potassium 4.4; Sodium 140   Other Studies Reviewed Today:  Echocardiogram 03/10/2016: Study Conclusions  - Left ventricle: The cavity size was normal. Wall thickness was   increased in a pattern of mild LVH. Systolic function was mildly   to moderately reduced. The estimated ejection fraction was in the   range of 40% to 45%. Diffuse hypokinesis. There is akinesis of   the basalinferior myocardium. The study is not technically   sufficient to allow evaluation of LV diastolic function. - Ventricular septum: Septal motion showed abnormal function and   dyssynergy. The contour showed diastolic flattening. - Aortic valve: Trileaflet; mildly calcified leaflets. Left   coronary cusp mobility was  restricted. There was trivial   regurgitation. - Mitral valve: Calcified annulus. There was mild to moderate   regurgitation. - Left atrium: The atrium was moderately dilated. - Right ventricle: The cavity size was severely dilated. Systolic   function was severely reduced. - Right atrium: The atrium was moderately dilated. Central venous   pressure (est): 8 mm Hg. - Tricuspid valve: There was mild regurgitation. - Pulmonary arteries: PA peak pressure: 30 mm Hg (S). - Pericardium, extracardiac: A small pericardial effusion was   identified circumferential to the heart. There was a left pleural   effusion.  Impressions:  - Mild LVH with LVEF 40-45%, diffuse hypokinesis with akinesis of   the basal inferior wall. Indeterminate diastolic function in the   setting of atrial fibrillation. Septal dyssynergy suggesting IVCD   and diastolic septal flattening also noted. Moderate left atrial   enlargement.  MAC with mild to moderate mitral regurgitation.   Sclerotic aortic valve with trivial aortic regurgitation.   Severely dilated right ventricle with severely reduced   contraction. Moderate right atrial enlargement. Mild tricuspid   regurgitation with PASP 30 mmHg. Left pleural effusion noted as   well as small pericardial effusion.  Assessment and Plan:   Current medicines were reviewed with the patient today.  No orders of the defined types were placed in this encounter.   Disposition:  Signed, Jonelle SidleSamuel G. McDowell, MD, [facility] CenterFACC 06/24/2016 1:17 PM    [facility] [address], [place], [place] [address] Phone: [phone]; Fax: [phone]

## 2016-06-24 NOTE — Progress Notes (Signed)
PROGRESS NOTE    Kathy CuriaVirginia L Page                   ZOX:[id number] DOB: 05/26/1931 DOA: 06/21/2016 PCP: Dwana MelenaZack Hall, MD                Brief Narrative:patient admitted with syncope, having bradycardia to 30's, with hx of afib RVR on Eliquis, Dig and recently changed Coreg to Bisoprolol. She responded to atropine, and is on Dopamine drip. Her Dig was at 2.8, and it was held.  Dig level came down to 2.0. She is feeling better. No other complaints. Still requires dopamine drip.    Assessment & Plan:  Principal Problem: Symptomatic bradycardia Active Problems: Type 2 diabetes mellitus (HCC) Essential hypertension, benign Atrial fibrillation (HCC) Chronic respiratory failure with hypoxia and hypercapnia (HCC) Digoxin toxicity  1/  Dig Toxicity: I will d/c digitalis. Monitor her on telemetry. Will attempt to wean of Dopamine again today. Can keep in ICU x 24 hours.  I don't think Dig would be a good cardiac drug for her anyhow. Will resume her Coreg pending her HR and BP tomorrow. She is a FULL CODE.   2/ Afib: Will consider Coreg if required.Continue with anticogulation.  3/ Chronic respiratory failure with hypoxia and hypercapnea: She is stable. I had used Diamox on her previous admission.  4/ DM: Stable will follow.   DVT prophylaxis:Eliquis.  Code Status:FULL CODE.  Family Communication:None.  Disposition Plan:To home when appropriate.   Consultants:  None.   Procedures:None.     Antimicrobials: Anti-infectives    None       Subjective:   She is not having any complaints.   Objective: Vitals:   06/24/16 0200 06/24/16 0300 06/24/16 0400 06/24/16 0500  BP: (!) 151/60 (!) 158/61 (!) 142/57 (!) 146/62  Pulse: 62 (!) 58 63 75  Resp: 19 19 20 16   Temp:   98.5 F (36.9 C)   TempSrc:   Oral   SpO2: 100% 99% 98% 100%  Weight:    66.2 kg (145 lb 15.1 oz)  Height:        Intake/Output Summary (Last 24 hours) at  06/24/16 0801 Last data filed at 06/24/16 0528  Gross per 24 hour  Intake           262.89 ml  Output             2875 ml  Net         -2612.11 ml   Filed Weights   06/22/16 0500 06/23/16 0500 06/24/16 0500  Weight: 69.6 kg (153 lb 7 oz) 66.3 kg (146 lb 2.6 oz) 66.2 kg (145 lb 15.1 oz)    Examination:  General exam: Appears calm and comfortable  Respiratory system: Clear to auscultation. Respiratory effort normal. Cardiovascular system: S1 & S2 heard, RRR. No JVD, murmurs, rubs, gallops or clicks. No pedal edema. Gastrointestinal system: Abdomen is nondistended, soft and nontender. No organomegaly or masses felt. Normal bowel sounds heard. Central nervous system: Alert and oriented. No focal neurological deficits. Extremities: Symmetric 5 x 5 power. Skin: No rashes, lesions or ulcers Psychiatry: Judgement and insight appear normal. Mood & affect appropriate.   Data Reviewed: I have personally reviewed following labs and imaging studies  CBC:  Recent Labs Lab 06/21/16 1654  WBC 6.9  NEUTROABS 4.4  HGB 11.0*  HCT 35.7*  MCV 90.8  PLT 166   Basic Metabolic Panel:  Recent Labs Lab 06/21/16 1654 06/22/16 0432  NA 139  140  K 4.8 4.4  CL 101 104  CO2 32 27  GLUCOSE 191* 180*  BUN 19 14  CREATININE 0.88 0.75  CALCIUM 9.0 8.8*   GFR: Estimated Creatinine Clearance: 49.9 mL/min (by C-G formula based on SCr of 0.8 mg/dL). Liver Function Tests:  Recent Labs Lab 06/21/16 1654  AST 15  ALT 15  ALKPHOS 59  BILITOT 0.8  PROT 6.4*  ALBUMIN 3.7   Coagulation Profile:  Recent Labs Lab 06/21/16 1654  INR 1.19   Cardiac Enzymes:  Recent Labs Lab 06/21/16 1654 06/22/16 0253  TROPONINI <0.03 <0.03   BNP (last 3 results)  Recent Labs  06/12/16 1049  PROBNP 358.0*   CBG:  Recent Labs Lab 06/23/16 0729 06/23/16 1141 06/23/16 1614 06/23/16 2213 06/24/16 0728  GLUCAP 155* 136* 127* 203* 209*    Recent Results (from the past 240 hour(s))  MRSA  PCR Screening     Status: None   Collection Time: 06/21/16  9:50 PM  Result Value Ref Range Status   MRSA by PCR NEGATIVE NEGATIVE Final    Comment:        The GeneXpert MRSA Assay (FDA approved for NASAL specimens only), is one component of a comprehensive MRSA colonization surveillance program. It is not intended to diagnose MRSA infection nor to guide or monitor treatment for MRSA infections.   Culture, Urine     Status: None (Preliminary result)   Collection Time: 06/22/16  6:30 AM  Result Value Ref Range Status   Specimen Description URINE, CATHETERIZED  Final   Special Requests NONE  Final   Culture PENDING  Incomplete   Report Status PENDING  Incomplete     Radiology Studies: No results found.  Scheduled Meds: . apixaban  5 mg Oral BID  . escitalopram  10 mg Oral Daily  . furosemide  40 mg Oral Daily  . insulin aspart  0-15 Units Subcutaneous TID WC  . insulin aspart  0-5 Units Subcutaneous QHS  . LORazepam  0.5 mg Oral QHS  . pantoprazole  40 mg Oral Daily   Continuous Infusions: . DOPamine 15 mcg/kg/min (06/23/16 2018)     LOS: 2 days   Timmey Lamba, MD FACP Hospitalist.   If 7PM-7AM, please contact night-coverage [email or web address] Password [email or web address] 06/24/2016, 8:01 AM

## 2016-06-24 NOTE — Care Management Note (Signed)
Case Management Note  Patient Details  Name: [name] MRN: [id number] Date of Birth: 09/08/1931  Subjective/Objective:                  Pt admitted with symptomatic bradycardia. She is from home, lives alone and has strong family support. She uses a walker for mobility and has continuous supplemental oxygen PTA. [facility] and is interested in re-starting those services at DC. Alroy BailiffLinda Lothian, of [facility], aware of referral and will obtain pt info from chart. Pt weaning from dopamine gtt at this time.   Action/Plan: Will cont to follow.   Expected Discharge Date:      06/24/2016            Expected Discharge Plan:  Home w Home Health Services  In-House Referral:  Clinical Social Work  Discharge planning Services  CM Consult  Post Acute Care Choice:  Home Health Choice offered to:  Patient  DME Arranged:    DME Agency:     HH Arranged:  RN HH Agency:  [facility]  Status of Service:  In process, will continue to follow  If discussed at Long Length of Stay Meetings, dates discussed:    Additional Comments:  Malcolm MetroChildress, Zebulin Siegel Demske, RN 06/24/2016, 1:36 PM

## 2016-06-25 ENCOUNTER — Ambulatory Visit: Payer: Medicare Other | Admitting: Cardiology

## 2016-06-25 DIAGNOSIS — J9611 Chronic respiratory failure with hypoxia: Secondary | ICD-10-CM

## 2016-06-25 DIAGNOSIS — I481 Persistent atrial fibrillation: Secondary | ICD-10-CM

## 2016-06-25 DIAGNOSIS — R55 Syncope and collapse: Secondary | ICD-10-CM

## 2016-06-25 DIAGNOSIS — I10 Essential (primary) hypertension: Secondary | ICD-10-CM

## 2016-06-25 DIAGNOSIS — I9589 Other hypotension: Secondary | ICD-10-CM

## 2016-06-25 DIAGNOSIS — E785 Hyperlipidemia, unspecified: Secondary | ICD-10-CM

## 2016-06-25 DIAGNOSIS — E1159 Type 2 diabetes mellitus with other circulatory complications: Secondary | ICD-10-CM

## 2016-06-25 DIAGNOSIS — J9612 Chronic respiratory failure with hypercapnia: Secondary | ICD-10-CM

## 2016-06-25 DIAGNOSIS — R001 Bradycardia, unspecified: Principal | ICD-10-CM

## 2016-06-25 DIAGNOSIS — J438 Other emphysema: Secondary | ICD-10-CM

## 2016-06-25 DIAGNOSIS — N390 Urinary tract infection, site not specified: Secondary | ICD-10-CM

## 2016-06-25 DIAGNOSIS — I5042 Chronic combined systolic (congestive) and diastolic (congestive) heart failure: Secondary | ICD-10-CM

## 2016-06-25 LAB — BASIC METABOLIC PANEL
ANION GAP: 5 (ref 5–15)
BUN: 23 mg/dL — ABNORMAL HIGH (ref 6–20)
CO2: 35 mmol/L — ABNORMAL HIGH (ref 22–32)
Calcium: 8.9 mg/dL (ref 8.9–10.3)
Chloride: 98 mmol/L — ABNORMAL LOW (ref 101–111)
Creatinine, Ser: 1.5 mg/dL — ABNORMAL HIGH (ref 0.44–1.00)
GFR, EST AFRICAN AMERICAN: 36 mL/min — AB (ref >60)
GFR, EST NON AFRICAN AMERICAN: 31 mL/min — AB (ref >60)
Glucose, Bld: 165 mg/dL — ABNORMAL HIGH (ref 65–99)
POTASSIUM: 4 mmol/L (ref 3.5–5.1)
SODIUM: 138 mmol/L (ref 135–145)

## 2016-06-25 LAB — GLUCOSE, CAPILLARY
Glucose-Capillary: 140 mg/dL — ABNORMAL HIGH (ref 65–99)
Glucose-Capillary: 148 mg/dL — ABNORMAL HIGH (ref 65–99)
Glucose-Capillary: 154 mg/dL — ABNORMAL HIGH (ref 65–99)
Glucose-Capillary: 192 mg/dL — ABNORMAL HIGH (ref 65–99)

## 2016-06-25 LAB — CBC
HEMATOCRIT: 38.4 % (ref 36.0–46.0)
HEMOGLOBIN: 11.9 g/dL — AB (ref 12.0–15.0)
MCH: 28 pg (ref 26.0–34.0)
MCHC: 31 g/dL (ref 30.0–36.0)
MCV: 90.4 fL (ref 78.0–100.0)
Platelets: 169 10*3/uL (ref 150–400)
RBC: 4.25 MIL/uL (ref 3.87–5.11)
RDW: 13.9 % (ref 11.5–15.5)
WBC: 11.5 10*3/uL — AB (ref 4.0–10.5)

## 2016-06-25 MED ORDER — SODIUM CHLORIDE 0.9 % IV SOLN
INTRAVENOUS | Status: AC
  Administered 2016-06-25 – 2016-06-26 (×2): via INTRAVENOUS

## 2016-06-25 MED ORDER — DEXTROSE 5 % IV SOLN
1.0000 g | INTRAVENOUS | Status: DC
  Administered 2016-06-25 – 2016-06-27 (×3): 1 g via INTRAVENOUS
  Filled 2016-06-25 (×4): qty 10

## 2016-06-25 NOTE — Progress Notes (Signed)
[facility].

## 2016-06-25 NOTE — Progress Notes (Signed)
[facility].  Family aware and meeting patient down there.

## 2016-06-25 NOTE — Care Management Important Message (Signed)
Important Message  Patient Details  Name: [name] MRN: [id number] Date of Birth: 05/27/1931   Medicare Important Message Given:  Yes    Malcolm MetroChildress, Beren Yniguez Demske, RN 06/25/2016, 11:11 AM

## 2016-06-25 NOTE — Progress Notes (Signed)
PROGRESS NOTE    [name]  ZOX:[id number] DOB: 19-Aug-1931 DOA: 06/21/2016 PCP: [name], MD    Brief Narrative:  53 yof with a history of type 2 DM, HTN, anemia, HLD, COPD, and afib with a CHADSVASC score of 5  Presented by the EMS with a syncopal episode and dizziness. While in the ED she was noted to be hyperglycemic and mildly anemic. Her vitals revealed a heart rate of 30. CXR and head CT were negative. She was admitted for symptomatic bradycardia.    Assessment & Plan:   Principal Problem:   Symptomatic bradycardia Active Problems:   Type 2 diabetes mellitus (HCC)   Essential hypertension, benign   Atrial fibrillation (HCC)   Chronic respiratory failure with hypoxia and hypercapnia (HCC)   Digoxin toxicity  1. Symptomatic bradycardia. Patient presented with an episode of syncope. She was noted to be bradycardic with a HR in the 30s. Prior to admission she was taking coreg and digoxin.  Digoxin level was noted to be mildly elevated but has since then improved. Both coreg and digoxin have been held. Despite holding these medication the patient has required dopamine infusion for bradycardia. It has been difficult to wean dopamine since she quickly becomes bradycardic. She does have a hx of atrial fibrillation but is currently in sinus rhythm. Thyroid studies checked on 8/24 are noted to be normal. Since her medications were held several days ago and she continues to require dopamine, will consult cardiology for further input.  2. Atrial fibrillation. She has a CHADSVASC score of 5. She is anticoagulated on eliquis. Initial EKG showed atrial fibrillation but currently in sinus.   3. UTI with gram negative rods. UA is indicative of infection. Urine culture shows multiple colonies. Will start her on Rocephin.  4. Chronic respiratory failure with hypoxia and hypercapnia. Secondary to COPD. She is chronically on oxygen. She is on BiPAP.  5. COPD. Stable. Appears compensated at this  time.  6. Chronic combined CHF. Her last EF was noted to be 40-45% in 02/2016. She is not on any beta blockers due to bradycardia. Appears compensated at this time.  7. Essential HTN. Blood pressures are soft. She is currently on vasopressors.  8. DM type 2. Stable. Continue SSI.  9. HLD. Continue statins.  10. Unsteady gait. Able to ambulate with a walker.   DVT prophylaxis: Eliquis  Code Status: Full  Family Communication: Friend bedside who helps care for patient Disposition Plan: Discharge home once improved    Consultants:   None   Procedures:   None   Antimicrobials:   Rocephin 9/6 >>   Subjective: Feels well. Breathing well. Per family she has not eaten. States she has a headache and feels nauseous. Denies any chest pain or SOB   Objective: Vitals:   06/25/16 0200 06/25/16 0300 06/25/16 0400 06/25/16 0500  BP: (!) 94/37 (!) 96/56 107/88 (!) 104/37  Pulse: (!) 46 (!) 55 (!) 49 (!) 50  Resp: 20 20 20 18   Temp:      TempSrc:      SpO2: 99% 96% 97% 97%  Weight:      Height:        Intake/Output Summary (Last 24 hours) at 06/25/16 0540 Last data filed at 06/25/16 0500  Gross per 24 hour  Intake           806.23 ml  Output              775 ml  Net  31.23 ml   Filed Weights   06/22/16 0500 06/23/16 0500 06/24/16 0500  Weight: 69.6 kg (153 lb 7 oz) 66.3 kg (146 lb 2.6 oz) 66.2 kg (145 lb 15.1 oz)    Examination:  General exam: Appears calm and comfortable  Respiratory system: Clear to auscultation. Respiratory effort normal. Cardiovascular system: S1 & S2 heard, RRR. No JVD, murmurs, rubs, gallops or clicks. No pedal edema. Gastrointestinal system: Abdomen is nondistended, soft and nontender. No organomegaly or masses felt. Normal bowel sounds heard. Central nervous system: Alert and oriented. No focal neurological deficits. Extremities: Symmetric 5 x 5 power. Skin: No rashes, lesions or ulcers Psychiatry: Judgement and insight appear normal.  Mood & affect appropriate.     Data Reviewed: I have personally reviewed following labs and imaging studies  CBC:  Recent Labs Lab 06/21/16 1654  WBC 6.9  NEUTROABS 4.4  HGB 11.0*  HCT 35.7*  MCV 90.8  PLT 166   Basic Metabolic Panel:  Recent Labs Lab 06/21/16 1654 06/22/16 0432  NA 139 140  K 4.8 4.4  CL 101 104  CO2 32 27  GLUCOSE 191* 180*  BUN 19 14  CREATININE 0.88 0.75  CALCIUM 9.0 8.8*   GFR: Estimated Creatinine Clearance: 49.9 mL/min (by C-G formula based on SCr of 0.8 mg/dL). Liver Function Tests:  Recent Labs Lab 06/21/16 1654  AST 15  ALT 15  ALKPHOS 59  BILITOT 0.8  PROT 6.4*  ALBUMIN 3.7   No results for input(s): LIPASE, AMYLASE in the last 168 hours. No results for input(s): AMMONIA in the last 168 hours. Coagulation Profile:  Recent Labs Lab 06/21/16 1654  INR 1.19   Cardiac Enzymes:  Recent Labs Lab 06/21/16 1654 06/22/16 0253  TROPONINI <0.03 <0.03   BNP (last 3 results)  Recent Labs  06/12/16 1049  PROBNP 358.0*   HbA1C: No results for input(s): HGBA1C in the last 72 hours. CBG:  Recent Labs Lab 06/23/16 2213 06/24/16 0728 06/24/16 1151 06/24/16 1648 06/24/16 2126  GLUCAP 203* 209* 166* 113* 201*   Lipid Profile: No results for input(s): CHOL, HDL, LDLCALC, TRIG, CHOLHDL, LDLDIRECT in the last 72 hours. Thyroid Function Tests: No results for input(s): TSH, T4TOTAL, FREET4, T3FREE, THYROIDAB in the last 72 hours. Anemia Panel: No results for input(s): VITAMINB12, FOLATE, FERRITIN, TIBC, IRON, RETICCTPCT in the last 72 hours. Sepsis Labs: No results for input(s): PROCALCITON, LATICACIDVEN in the last 168 hours.  Recent Results (from the past 240 hour(s))  MRSA PCR Screening     Status: None   Collection Time: 06/21/16  9:50 PM  Result Value Ref Range Status   MRSA by PCR NEGATIVE NEGATIVE Final    Comment:        The GeneXpert MRSA Assay (FDA approved for NASAL specimens only), is one component of  a comprehensive MRSA colonization surveillance program. It is not intended to diagnose MRSA infection nor to guide or monitor treatment for MRSA infections.   Culture, Urine     Status: None (Preliminary result)   Collection Time: 06/22/16  6:30 AM  Result Value Ref Range Status   Specimen Description URINE, CATHETERIZED  Final   Special Requests NONE  Final   Culture PENDING  Incomplete   Report Status PENDING  Incomplete         Radiology Studies: No results found.      Scheduled Meds: . apixaban  5 mg Oral BID  . escitalopram  10 mg Oral Daily  . furosemide  40  mg Oral Daily  . insulin aspart  0-15 Units Subcutaneous TID WC  . insulin aspart  0-5 Units Subcutaneous QHS  . LORazepam  0.5 mg Oral QHS  . pantoprazole  40 mg Oral Daily   Continuous Infusions: . DOPamine 5 mcg/kg/min (06/25/16 0500)     LOS: 3 days    Time spent: 25 minutes     Erick Blinks, MD [facility] If 7PM-7AM, please contact night-coverage [email or web address] Password [email or web address] 06/25/2016, 5:40 AM   By signing my name below, I, Cynda Acres, attest that this documentation has been prepared under the direction and in the presence of Erick Blinks, MD. Electronically signed: Cynda Acres, Scribe. 06/25/16 9:19 AM  I, Dr. Erick Blinks, personally performed the services described in this documentaiton. All medical record entries made by the scribe were at my direction and in my presence. I have reviewed the chart and agree that the record reflects my personal performance and is accurate and complete  Erick Blinks, MD, 06/25/2016 9:49 AM

## 2016-06-25 NOTE — Consult Note (Signed)
The patient was seen and examined, and I agree with the history, physical exam, assessment and plan as documented above which has been discussed with Leda GauzeM. Lenze PA-C, with modifications as noted below. Pt admitted with syncope and dizziness. Goes in and out of atrial fibrillation with slow rate.  AV nodal blocking agents have been on hold for several days (digoxin, bisoprolol, previously on Coreg). On Eliquis for anticoagulation. Currently on IV dopamine with inability to wean, as it has led to marked bradycardia (HR 30's) and hypotension. TSH normal. Being treated for UTI. Has chronic systolic heart failure but is not decompensated at present. Denies chest pain/SOB. Does c/o dizziness.  [facility].   Kathy DockerSuresh Sheriden Archibeque, MD, [facility]  06/25/2016 11:01 AM

## 2016-06-25 NOTE — Consult Note (Signed)
Cardiology Consultation   Patient ID: [name]; [id number]; [birth year]   Admit date: 06/21/2016 Date of Consult: 06/25/2016  Referring MD: Dr. Kerry Hough Cardiologist: Dr. Diona Browner Consulting Cardiologist:Dr. Purvis Sheffield  Patient Care Team: Benita Stabile, MD as PCP - General (Internal Medicine) Corbin Ade, MD as Consulting Physician (Gastroenterology)    Reason for Consultation: bradycardia/syncope   History of Present Illness: [name] is a 82 y.o. female with a hx of  CAD S/P BMS x 3 RCA 1999 [facility]), PTCRA RCA 01/2000, LVEF 51% by MV 2004. Saw Dr. Diona Browner 02/2016 for new Afib and started on Cardizem 180 mg daily for rate control. CHADSVASC=5 and discussed Eliquis but wasn't started before checking labs with history of GI bleed. She also has COPD on chronic O2, HTN, HLD, DM2.  She was hospitalized here in late May with rapid afib and changed to Coreg and Dig. Eliquis added. She also had bilateral pleural effusion in setting of CHF. EF 40-45%, and pneumonia.Had AKI so no ACEI. Diuresed 9.5 Liters. Went to [facility] [employer] [facility]. Saw Dr. Sherene Sires 06/12/16 who stopped her Coreg and started Bisoprolol 5 mg daily.  Felt dizzy for a weak. Her niece was with her when she slumped over and didn't know where she was.She presented to ER with syncopal episode and HR 30's. Has been on Dopamine to keep HR and BP up. Was in afib but now NSR. Tried to wean Dopamine off last night but HR kept dropping to 30's and BP in 80's. Been off meds and on Dopamine for 4 days. Denies chest pain, shortness of breath.   Past Medical History:  Diagnosis Date  . A-fib (HCC)   . Asthma   . COPD (chronic obstructive pulmonary disease) (HCC)    Oxygen dependent  . Coronary atherosclerosis of native coronary artery   . Essential hypertension, benign   . History of GI bleed    ulcer  . Hyperlipidemia   . Old inferior wall myocardial infarction 1999   [facility]  . Type 2 diabetes mellitus (HCC)     Past Surgical  History:  Procedure Laterality Date  . Cesareen section    . CHOLECYSTECTOMY    . COLONOSCOPY  09/2004   RMR: For obscure GI bleed. Left-sided diverticula, otherwise normal ileocolonoscopy  . Coronary artery stent  1999   BMS x 3 RCA  ([facility]), PTCRA RC4  . ESOPHAGOGASTRODUODENOSCOPY  11 2005   RMR: For melena, normal exam.  . EXPLORATORY LAPAROTOMY    . GIVENS CAPSULE STUDY  08/2004   official report unavailable, but reported couple of AVMs, nonbleeding  . PARTIAL COLECTOMY  1977   blockage/gangrene? unclear if small bowel or colon  . THORACENTESIS Left 03/10/16   1 L transudative fluid      Home Meds: Prior to Admission medications   Medication Sig Start Date End Date Taking? Authorizing Provider  apixaban (ELIQUIS) 5 MG TABS tablet Take 1 tablet (5 mg total) by mouth 2 (two) times daily. 03/20/16  Yes Erick Blinks, MD  bisoprolol (ZEBETA) 5 MG tablet Take 1 tablet (5 mg total) by mouth daily. 06/12/16  Yes Nyoka Cowden, MD  digoxin (LANOXIN) 0.25 MG tablet Take 0.25 mg by mouth daily.   Yes Historical Provider, MD  escitalopram (LEXAPRO) 20 MG tablet Take 10 mg by mouth daily.    Yes Historical Provider, MD  furosemide (LASIX) 40 MG tablet Take 40 mg by mouth daily. Daily at 0800 am   Yes Historical Provider, MD  ipratropium-albuterol (DUONEB) 0.5-2.5 (3) MG/3ML SOLN Take 3 mLs by nebulization 3 (three) times daily. Patient taking differently: Take 3 mLs by nebulization every 6 (six) hours as needed (for shortness of breath).  03/20/16  Yes Erick BlinksJehanzeb Memon, MD  LORazepam (ATIVAN) 0.5 MG tablet Take 1 tablet (0.5 mg total) by mouth daily. Take one tablet by mouth once daily Patient taking differently: Take 0.5 mg by mouth at bedtime. Take one tablet by mouth once daily 04/15/16  Yes Sharon SellerJessica K Eubanks, NP  Multiple Vitamins-Minerals (CENTRUM SILVER 50+WOMEN PO) Take 1 tablet by mouth daily.   Yes Historical Provider, MD  nitroGLYCERIN (NITROSTAT) 0.4 MG SL tablet Place 1 tablet (0.4 mg  total) under the tongue every 5 (five) minutes as needed for chest pain. 04/24/14  Yes Jonelle SidleSamuel G McDowell, MD  NON FORMULARY Bipap qhs with IPAP of 20 and EPAP of 5. Tidal volume of 600 and respiratory rate of 16. Twice a day   Yes Historical Provider, MD  omeprazole (PRILOSEC) 20 MG capsule Take 1 capsule (20 mg total) by mouth 2 (two) times daily before a meal. 06/12/16  Yes Nyoka CowdenMichael B Wert, MD  OXYGEN Inhale 2.5 L into the lungs continuous.   Yes Historical Provider, MD  potassium chloride (K-DUR,KLOR-CON) 10 MEQ tablet Take 10 mEq by mouth 2 (two) times daily.   Yes Historical Provider, MD  vitamin B-12 (CYANOCOBALAMIN) 1000 MCG tablet Take 1,000 mcg by mouth daily.   Yes Historical Provider, MD    Current Medications: . apixaban  5 mg Oral BID  . cefTRIAXone (ROCEPHIN)  IV  1 g Intravenous Q24H  . escitalopram  10 mg Oral Daily  . furosemide  40 mg Oral Daily  . insulin aspart  0-15 Units Subcutaneous TID WC  . insulin aspart  0-5 Units Subcutaneous QHS  . LORazepam  0.5 mg Oral QHS  . pantoprazole  40 mg Oral Daily     Allergies:    Allergies  Allergen Reactions  . Codeine Nausea Only  . Heparin Other (See Comments)    Causes internal bleeding     Social History:   The patient  reports that she quit smoking about 18 years ago. Her smoking use included Cigarettes. She has a 80.00 pack-year smoking history. She has never used smokeless tobacco. She reports that she does not drink alcohol or use drugs.    Family History:   The patient's family history includes Cancer in her brother and sister; Diabetes in her brother, father, and mother; Heart disease in her father.   ROS:  Please see the history of present illness.  Review of Systems  Constitution: Positive for weakness and malaise/fatigue.  Cardiovascular: Positive for dyspnea on exertion and irregular heartbeat.  Musculoskeletal: Positive for muscle weakness.  Neurological: Positive for dizziness and light-headedness.   All  other ROS reviewed and negative.      Vital Signs: Blood pressure (!) 147/54, pulse 70, temperature 97.6 F (36.4 C), temperature source Oral, resp. rate (!) 36, height 5\' 4"  (1.626 m), weight 145 lb 15.1 oz (66.2 kg), SpO2 100 %.   PHYSICAL EXAM: General:  Well nourished, well developed, in no acute distress  HEENT: normal Lymph: no adenopathy Neck: no JVD Endocrine:  No thryomegaly Vascular: No carotid bruits; FA pulses 2+ bilaterally without bruits  Cardiac:  RRR; normal S1, S2; 1/6 systolic murmur LSB, rub, bruit, thrill, or heave Lungs: Decreased breath sounds with crackles at bases. Abd: soft, nontender, no hepatomegaly  Ext: no edema, Good  distal pulses bilaterally Musculoskeletal:  No deformities, BUE and BLE strength normal and equal Skin: warm and dry  Neuro:  CNs 2-12 intact, no focal abnormalities noted Psych:  Normal affect    EKG:  afib at 37bpm 06/21/16  Telemetry: afib/NSR/bradycardia  Labs: No results for input(s): CKTOTAL, CKMB, TROPONINI in the last 72 hours. Lab Results  Component Value Date   WBC 6.9 06/21/2016   HGB 11.0 (L) 06/21/2016   HCT 35.7 (L) 06/21/2016   MCV 90.8 06/21/2016   PLT 166 06/21/2016    Recent Labs Lab 06/21/16 1654 06/22/16 0432  NA 139 140  K 4.8 4.4  CL 101 104  CO2 32 27  BUN 19 14  CREATININE 0.88 0.75  CALCIUM 9.0 8.8*  PROT 6.4*  --   BILITOT 0.8  --   ALKPHOS 59  --   ALT 15  --   AST 15  --   GLUCOSE 191* 180*   No results found for: CHOL, HDL, LDLCALC, TRIG No results found for: DDIMER  Radiology/Studies:  Dg Chest 2 View  Result Date: 06/12/2016 CLINICAL DATA:  Recent hospitalization for dyspnea. No current complaints. History of coronary artery disease, previous MI, stent placement, diabetes, former smoker, COPD. EXAM: CHEST  2 VIEW COMPARISON:  Portable chest x-ray of March 20, 2016 FINDINGS: The lungs remain hyperinflated. There is no focal infiltrate. The interstitial markings are within the limits of  normal. There is mild scarring peripherally in the left mid lung. There is no pleural effusion. The heart is top-normal in size. The pulmonary vascularity is normal. There is calcification in the wall of the aortic arch. There is degenerative disc disease at multiple thoracic levels. IMPRESSION: COPD. No pneumonia nor pleural effusion which is significant improvement since the previous study. Cardiomegaly without pulmonary edema. Aortic atherosclerosis. Electronically Signed   By: David  [place] M.D.   On: 06/12/2016 11:29   Ct Head Wo Contrast  Result Date: 06/21/2016 CLINICAL DATA:  Unwitnessed fall. Syncope. Dizziness. History of diabetes, hypertension, coronary artery disease. EXAM: CT HEAD WITHOUT CONTRAST TECHNIQUE: Contiguous axial images were obtained from the base of the skull through the vertex without intravenous contrast. COMPARISON:  None. FINDINGS: Brain: Diffuse cerebral atrophy. Mild ventricular dilatation consistent with central atrophy. Patchy low-attenuation changes in the deep white matter consistent with small vessel ischemia. No mass effect or midline shift. No abnormal extra-axial fluid collections. Gray-white matter junctions are distinct. Basal cisterns are not effaced. No evidence of acute intracranial hemorrhage. Vascular: No hyperdense vessel or unexpected calcification. Skull: No depressed skull fractures. Sinuses/Orbits: Mucosal thickening in the paranasal sinuses. Opacification of the left sphenoid sinus. Mastoid air cells are not opacified. Other: None. IMPRESSION: No acute intracranial abnormalities. Mild chronic atrophy and small vessel ischemic changes. Electronically Signed   By: Burman Nieves M.D.   On: 06/21/2016 18:54   Dg Chest Port 1 View  Result Date: 06/21/2016 CLINICAL DATA:  Syncope.  Dizziness.  Bradycardia. EXAM: PORTABLE CHEST 1 VIEW COMPARISON:  06/12/2016 FINDINGS: Moderate cardiomegaly remains stable. Pulmonary hyperinflation is again noted. Mild scarring  again noted in the left lung. No evidence of pulmonary consolidation or pneumothorax. IMPRESSION: Stable cardiomegaly and probable COPD.  No acute findings. Electronically Signed   By: Myles Rosenthal M.D.   On: 06/21/2016 17:21   Echo 02/2016: Study Conclusions   - Left ventricle: The cavity size was normal. Wall thickness was   increased in a pattern of mild LVH. Systolic function was mildly   to  moderately reduced. The estimated ejection fraction was in the   range of 40% to 45%. Diffuse hypokinesis. There is akinesis of   the basalinferior myocardium. The study is not technically   sufficient to allow evaluation of LV diastolic function. - Ventricular septum: Septal motion showed abnormal function and   dyssynergy. The contour showed diastolic flattening. - Aortic valve: Trileaflet; mildly calcified leaflets. Left   coronary cusp mobility was restricted. There was trivial   regurgitation. - Mitral valve: Calcified annulus. There was mild to moderate   regurgitation. - Left atrium: The atrium was moderately dilated. - Right ventricle: The cavity size was severely dilated. Systolic   function was severely reduced. - Right atrium: The atrium was moderately dilated. Central venous   pressure (est): 8 mm Hg. - Tricuspid valve: There was mild regurgitation. - Pulmonary arteries: PA peak pressure: 30 mm Hg (S). - Pericardium, extracardiac: A small pericardial effusion was   identified circumferential to the heart. There was a left pleural   effusion.   Impressions:   - Mild LVH with LVEF 40-45%, diffuse hypokinesis with akinesis of   the basal inferior wall. Indeterminate diastolic function in the   setting of atrial fibrillation. Septal dyssynergy suggesting IVCD   and diastolic septal flattening also noted. Moderate left atrial   enlargement. MAC with mild to moderate mitral regurgitation.   Sclerotic aortic valve with trivial aortic regurgitation.   Severely dilated right ventricle  with severely reduced   contraction. Moderate right atrial enlargement. Mild tricuspid   regurgitation with PASP 30 mmHg. Left pleural effusion noted as   well as small pericardial effusion.   PROBLEM LIST:  Principal Problem:   Symptomatic bradycardia Active Problems:   Type 2 diabetes mellitus (HCC)   Essential hypertension, benign   COPD (chronic obstructive pulmonary disease) (HCC)   Hyperlipidemia   Atrial fibrillation (HCC)   Combined congestive systolic and diastolic heart failure (HCC)   Chronic respiratory failure with hypoxia and hypercapnia (HCC)   Digoxin toxicity   UTI (urinary tract infection)     ASSESSMENT AND PLAN:  Sick sinus rhythm with history of rapid afib now with bradycardia and syncope off meds and dopamine dependent. Transfer to [facility] for EPS evaluation and possible pacemaker  Syncope with HR in 30's on bisoprolol and digoxin with dig level of 2.8 on admission. Off meds for 4 days and still bradycardic  Afib with rapid rates in past, CHADSVASC=5 on Eliquis-watch carefully with history of GI bleed. Received am dose. Will hold for now. Probably start lovenox in am for possible pacemaker.  Chronic combined CHF EF 40-45% compensated  COPD O2 dependent follow by Dr. Sherene Sires  HTN: now hypotensive   Signed, Jacolyn Reedy, PA-C  06/25/2016 10:03 AM

## 2016-06-26 ENCOUNTER — Inpatient Hospital Stay (HOSPITAL_COMMUNITY)
Admission: EM | Disposition: A | Discharge status: Home Health | Payer: Self-pay | Source: Home / Self Care | Attending: Internal Medicine

## 2016-06-26 DIAGNOSIS — I495 Sick sinus syndrome: Secondary | ICD-10-CM

## 2016-06-26 HISTORY — PX: EP IMPLANTABLE DEVICE: SHX172B

## 2016-06-26 LAB — PROTIME-INR
INR: 1.32
PROTHROMBIN TIME: 16.4 s — AB (ref 11.4–15.2)

## 2016-06-26 LAB — GLUCOSE, CAPILLARY
GLUCOSE-CAPILLARY: 143 mg/dL — AB (ref 65–99)
GLUCOSE-CAPILLARY: 137 mg/dL — AB (ref 65–99)
Glucose-Capillary: 86 mg/dL (ref 65–99)
Glucose-Capillary: 170 mg/dL — ABNORMAL HIGH (ref 65–99)

## 2016-06-26 LAB — BASIC METABOLIC PANEL
ANION GAP: 8 (ref 5–15)
BUN: 12 mg/dL (ref 6–20)
CO2: 31 mmol/L (ref 22–32)
Calcium: 9 mg/dL (ref 8.9–10.3)
Chloride: 102 mmol/L (ref 101–111)
Creatinine, Ser: 0.82 mg/dL (ref 0.44–1.00)
GFR calc Af Amer: >60 mL/min (ref >60)
Glucose, Bld: 170 mg/dL — ABNORMAL HIGH (ref 65–99)
POTASSIUM: 3.7 mmol/L (ref 3.5–5.1)
SODIUM: 141 mmol/L (ref 135–145)

## 2016-06-26 LAB — CBC
HCT: 36.2 % (ref 36.0–46.0)
Hemoglobin: 11.1 g/dL — ABNORMAL LOW (ref 12.0–15.0)
MCH: 27.5 pg (ref 26.0–34.0)
MCHC: 30.7 g/dL (ref 30.0–36.0)
MCV: 89.6 fL (ref 78.0–100.0)
PLATELETS: 150 10*3/uL (ref 150–400)
RBC: 4.04 MIL/uL (ref 3.87–5.11)
RDW: 13.7 % (ref 11.5–15.5)
WBC: 6.1 10*3/uL (ref 4.0–10.5)

## 2016-06-26 LAB — URINE CULTURE: Culture: >=100000 — AB

## 2016-06-26 SURGERY — PACEMAKER IMPLANT
Anesthesia: LOCAL

## 2016-06-26 MED ORDER — MIDAZOLAM HCL 5 MG/5ML IJ SOLN
INTRAMUSCULAR | Status: DC | PRN
  Administered 2016-06-26 (×2): 1 mg via INTRAVENOUS

## 2016-06-26 MED ORDER — FENTANYL CITRATE (PF) 100 MCG/2ML IJ SOLN
INTRAMUSCULAR | Status: DC | PRN
  Administered 2016-06-26: 25 ug via INTRAVENOUS
  Administered 2016-06-26: 12.5 ug via INTRAVENOUS

## 2016-06-26 MED ORDER — HEPARIN (PORCINE) IN NACL 2-0.9 UNIT/ML-% IJ SOLN
INTRAMUSCULAR | Status: AC
Start: 2016-06-26 — End: 2016-06-26
  Filled 2016-06-26: qty 500

## 2016-06-26 MED ORDER — IOPAMIDOL (ISOVUE-370) INJECTION 76%
INTRAVENOUS | Status: AC
Start: 2016-06-26 — End: 2016-06-26
  Filled 2016-06-26: qty 50

## 2016-06-26 MED ORDER — CHLORHEXIDINE GLUCONATE 4 % EX LIQD: 60.0000 mL | Freq: Once | CUTANEOUS | Status: AC

## 2016-06-26 MED ORDER — CHLORHEXIDINE GLUCONATE 4 % EX LIQD
60.0000 mL | Freq: Once | CUTANEOUS | Status: AC
  Administered 2016-06-26: 4 via TOPICAL
  Filled 2016-06-26: qty 60

## 2016-06-26 MED ORDER — FENTANYL CITRATE (PF) 100 MCG/2ML IJ SOLN
INTRAMUSCULAR | Status: AC
  Filled 2016-06-26: qty 2

## 2016-06-26 MED ORDER — GENTAMICIN SULFATE 40 MG/ML IJ SOLN
INTRAMUSCULAR | Status: AC
  Filled 2016-06-26: qty 2

## 2016-06-26 MED ORDER — ACETAMINOPHEN 325 MG PO TABS
325.0000 mg | ORAL_TABLET | ORAL | Status: DC | PRN
  Administered 2016-06-26: 650 mg via ORAL
  Filled 2016-06-26: qty 2

## 2016-06-26 MED ORDER — ONDANSETRON HCL 4 MG/2ML IJ SOLN: 4.0000 mg | Freq: Four times a day (QID) | INTRAMUSCULAR | Status: DC | PRN

## 2016-06-26 MED ORDER — CEFAZOLIN IN D5W 1 GM/50ML IV SOLN
1.0000 g | Freq: Four times a day (QID) | INTRAVENOUS | Status: AC
  Administered 2016-06-26 – 2016-06-27 (×3): 1 g via INTRAVENOUS
  Filled 2016-06-26 (×3): qty 50

## 2016-06-26 MED ORDER — MIDAZOLAM HCL 5 MG/5ML IJ SOLN
INTRAMUSCULAR | Status: AC
Start: 2016-06-26 — End: 2016-06-26
  Filled 2016-06-26: qty 5

## 2016-06-26 MED ORDER — LIDOCAINE HCL (PF) 1 % IJ SOLN
INTRAMUSCULAR | Status: DC | PRN
  Administered 2016-06-26: 55 mL

## 2016-06-26 MED ORDER — CEFAZOLIN SODIUM-DEXTROSE 2-4 GM/100ML-% IV SOLN
2.0000 g | INTRAVENOUS | Status: AC
  Administered 2016-06-26: 2 g via INTRAVENOUS

## 2016-06-26 MED ORDER — CEFAZOLIN SODIUM-DEXTROSE 2-4 GM/100ML-% IV SOLN
INTRAVENOUS | Status: AC
Start: 2016-06-26 — End: 2016-06-26
  Filled 2016-06-26: qty 100

## 2016-06-26 MED ORDER — IOPAMIDOL (ISOVUE-370) INJECTION 76%
INTRAVENOUS | Status: DC | PRN
  Administered 2016-06-26: 15 mL via INTRAVENOUS
  Administered 2016-06-26: 20 mL via INTRAVENOUS

## 2016-06-26 MED ORDER — SODIUM CHLORIDE 0.9 % IR SOLN: 80.0000 mg | Status: DC

## 2016-06-26 SURGICAL SUPPLY — 9 items
CABLE SURGICAL S-101-97-12 (CABLE) ×1 IMPLANT
LEAD CAPSURE NOVUS 5076-52CM (Lead) ×1 IMPLANT
LEAD CAPSURE NOVUS 5076-58CM (Lead) ×1 IMPLANT
PAD DEFIB LIFELINK (PAD) ×1 IMPLANT
PPM ADVISA MRI DR A2DR01 (Pacemaker) ×1 IMPLANT
SHEATH CLASSIC 7F (SHEATH) ×2 IMPLANT
SHEATH CLASSIC 7F 25CM (SHEATH) ×1 IMPLANT
TRAY PACEMAKER INSERTION (PACKS) ×1 IMPLANT
WIRE HI TORQ VERSACORE-J 145CM (WIRE) ×1 IMPLANT

## 2016-06-26 NOTE — Discharge Instructions (Signed)
° ° °  Supplemental Discharge Instructions for  Pacemaker/Defibrillator Patients  Activity No heavy lifting or vigorous activity with your left/right arm for 6 to 8 weeks.  Do not raise your left/right arm above your head for one week.  Gradually raise your affected arm as drawn below.           __     06/30/16                    07/01/16                        07/02/16                 07/03/16  NO DRIVING for  1 week   ; you may begin driving on  1/61/099/14/17   .  WOUND CARE - Keep the wound area clean and dry.  Do not get this area wet for one week. No showers for one week; you may shower on  07/03/16   . - The tape/steri-strips on your wound will fall off; do not pull them off.  No bandage is needed on the site.  DO  NOT apply any creams, oils, or ointments to the wound area. - If you notice any drainage or discharge from the wound, any swelling or bruising at the site, or you develop a fever > 101? F after you are discharged home, call the office at once.  Special Instructions - You are still able to use cellular telephones; use the ear opposite the side where you have your pacemaker/defibrillator.  Avoid carrying your cellular phone near your device. - When traveling through airports, show security personnel your identification card to avoid being screened in the metal detectors.  Ask the security personnel to use the hand wand. - Avoid arc welding equipment, TENS units (transcutaneous nerve stimulators).  Call the office for questions about other devices. - Avoid electrical appliances that are in poor condition or are not properly grounded. - Microwave ovens are safe to be near or to operate.

## 2016-06-26 NOTE — Consult Note (Signed)
ELECTROPHYSIOLOGY CONSULT NOTE    Patient ID: ALYSSHA HOUSH MRN: [id number], DOB/AGE: June 24, 1931 82 y.o.  Admit date: 06/21/2016 Date of Consult: 06/26/2016  Primary Physician: [name], MD Primary Cardiologist: Fransisco Hertz MD: Purvis Sheffield  Reason for Consultation: symptomatic bradycardia   HPI:  [name] is a 82 y.o. female with a past medical history significant for persistent atrial fibrillation, CAD, COPD, hypertension, prior GI bleed, and diabetes. She has had a several day history prior to admission of syncope as well as exercise intolerance and fatigue.  She "did not want to bother anyone" and so did not tell her family about syncope.  She was with her niece the next day when she had another syncopal spell and was taken to [facility] for further evaluation.  Her rate controlling medications were held and she was placed on Dopamine for improved sinus rate. She has had persistent bradycardia with inability to wean Dopamine and was transferred to [facility] for evaluation for pacemaker implantation.  She currently denies chest pain, shortness of breath, recent fevers, chills, nausea or vomiting. She is being treated for UTI but is afebrile.   Echo 02/2016 demonstrated EF 40-45%, mild LVH, LA 38, small pericardial effusion, RV function reduced.   Past Medical History:  Diagnosis Date  . A-fib (HCC)   . Asthma   . COPD (chronic obstructive pulmonary disease) (HCC)    Oxygen dependent  . Coronary atherosclerosis of native coronary artery   . Essential hypertension, benign   . History of GI bleed    ulcer  . Hyperlipidemia   . Old inferior wall myocardial infarction 1999   [facility]  . Type 2 diabetes mellitus [facility])      Surgical History:  Past Surgical History:  Procedure Laterality Date  . Cesareen section    . CHOLECYSTECTOMY    . COLONOSCOPY  09/2004   RMR: For obscure GI bleed. Left-sided diverticula, otherwise normal ileocolonoscopy  . Coronary artery stent   1999   BMS x 3 RCA  ([facility]), PTCRA RC4  . ESOPHAGOGASTRODUODENOSCOPY  11 2005   RMR: For melena, normal exam.  . EXPLORATORY LAPAROTOMY    . GIVENS CAPSULE STUDY  08/2004   official report unavailable, but reported couple of AVMs, nonbleeding  . PARTIAL COLECTOMY  1977   blockage/gangrene? unclear if small bowel or colon  . THORACENTESIS Left 03/10/16   1 L transudative fluid     Prescriptions Prior to Admission  Medication Sig Dispense Refill Last Dose  . apixaban (ELIQUIS) 5 MG TABS tablet Take 1 tablet (5 mg total) by mouth 2 (two) times daily. 60 tablet 0 06/21/2016 at 1130a  . bisoprolol (ZEBETA) 5 MG tablet Take 1 tablet (5 mg total) by mouth daily. 30 tablet 2 06/21/2016 at 1130a  . digoxin (LANOXIN) 0.25 MG tablet Take 0.25 mg by mouth daily.   06/21/2016 at Unknown time  . escitalopram (LEXAPRO) 20 MG tablet Take 10 mg by mouth daily.    06/21/2016 at Unknown time  . furosemide (LASIX) 40 MG tablet Take 40 mg by mouth daily. Daily at 0800 am   06/21/2016 at Unknown time  . ipratropium-albuterol (DUONEB) 0.5-2.5 (3) MG/3ML SOLN Take 3 mLs by nebulization 3 (three) times daily. (Patient taking differently: Take 3 mLs by nebulization every 6 (six) hours as needed (for shortness of breath). ) 360 mL  unknown  . LORazepam (ATIVAN) 0.5 MG tablet Take 1 tablet (0.5 mg total) by mouth daily. Take one tablet by mouth once  daily (Patient taking differently: Take 0.5 mg by mouth at bedtime. Take one tablet by mouth once daily) 30 tablet 0 06/20/2016 at Unknown time  . Multiple Vitamins-Minerals (CENTRUM SILVER 50+WOMEN PO) Take 1 tablet by mouth daily.   06/21/2016 at Unknown time  . nitroGLYCERIN (NITROSTAT) 0.4 MG SL tablet Place 1 tablet (0.4 mg total) under the tongue every 5 (five) minutes as needed for chest pain. 25 tablet 3 unknown  . NON FORMULARY Bipap qhs with IPAP of 20 and EPAP of 5. Tidal volume of 600 and respiratory rate of 16. Twice a day   06/20/2016 at Unknown time  . omeprazole (PRILOSEC) 20  MG capsule Take 1 capsule (20 mg total) by mouth 2 (two) times daily before a meal. 60 capsule 11 06/21/2016 at Unknown time  . OXYGEN Inhale 2.5 L into the lungs continuous.   06/21/2016 at Unknown time  . potassium chloride (K-DUR,KLOR-CON) 10 MEQ tablet Take 10 mEq by mouth 2 (two) times daily.   06/21/2016 at Unknown time  . vitamin B-12 (CYANOCOBALAMIN) 1000 MCG tablet Take 1,000 mcg by mouth daily.   06/21/2016 at Unknown time    Inpatient Medications:  . apixaban  5 mg Oral BID  . cefTRIAXone (ROCEPHIN)  IV  1 g Intravenous Q24H  . escitalopram  10 mg Oral Daily  . insulin aspart  0-15 Units Subcutaneous TID WC  . insulin aspart  0-5 Units Subcutaneous QHS  . LORazepam  0.5 mg Oral QHS  . pantoprazole  40 mg Oral Daily    Allergies:  Allergies  Allergen Reactions  . Codeine Nausea Only  . Heparin Other (See Comments)    Causes internal bleeding     Social History   Social History  . Marital status: Widowed    Spouse name: N/A  . Number of children: 2  . Years of education: N/A   Occupational History  . Not on file.   Social History Main Topics  . Smoking status: Former Smoker    Packs/day: 2.00    Years: 40.00    Types: Cigarettes    Quit date: 01/13/1998  . Smokeless tobacco: Never Used  . Alcohol use No  . Drug use: No  . Sexual activity: Not on file   Other Topics Concern  . Not on file   Social History Narrative  . No narrative on file     Family History  Problem Relation Age of Onset  . Diabetes Mother   . Diabetes Father   . Heart disease Father   . Cancer Sister     unknown  . Cancer Brother     unknown  . Diabetes Brother   . Colon cancer Neg Hx      Review of Systems: All other systems reviewed and are otherwise negative except as noted above.  Physical Exam: Vitals:   06/26/16 0400 06/26/16 0500 06/26/16 0600 06/26/16 0700  BP:   (!) 140/52 (!) 141/50  Pulse: 74 70 66 67  Resp: (!) 21 20 17 18   Temp: 98.6 F (37 C)     TempSrc: Oral      SpO2: 98% 100% 99% 100%  Weight:      Height:        GEN- The patient is elderly and chronically ill appearing, alert and oriented x 3 today.   HEENT: normocephalic, atraumatic; sclera clear, conjunctiva pink; hearing intact; oropharynx clear; neck supple  Lungs- Clear to ausculation bilaterally, normal work of breathing.  No wheezes,  rales, rhonchi Heart- Regular rate and rhythm  GI- soft, non-tender, non-distended, bowel sounds present Extremities- no clubbing, cyanosis, or edema  MS- no significant deformity or atrophy Skin- warm and dry, no rash or lesion Psych- euthymic mood, full affect Neuro- strength and sensation are intact  Labs:   Lab Results  Component Value Date   WBC 6.1 06/26/2016   HGB 11.1 (L) 06/26/2016   HCT 36.2 06/26/2016   MCV 89.6 06/26/2016   PLT 150 06/26/2016    Recent Labs Lab 06/21/16 1654  06/26/16 0707  NA 139  < > 141  K 4.8  < > 3.7  CL 101  < > 102  CO2 32  < > 31  BUN 19  < > 12  CREATININE 0.88  < > 0.82  CALCIUM 9.0  < > 9.0  PROT 6.4*  --   --   BILITOT 0.8  --   --   ALKPHOS 59  --   --   ALT 15  --   --   AST 15  --   --   GLUCOSE 191*  < > 170*  < > = values in this interval not displayed.    Radiology/Studies: Dg Chest 2 View Result Date: 06/12/2016 CLINICAL DATA:  Recent hospitalization for dyspnea. No current complaints. History of coronary artery disease, previous MI, stent placement, diabetes, former smoker, COPD. EXAM: CHEST  2 VIEW COMPARISON:  Portable chest x-ray of March 20, 2016 FINDINGS: The lungs remain hyperinflated. There is no focal infiltrate. The interstitial markings are within the limits of normal. There is mild scarring peripherally in the left mid lung. There is no pleural effusion. The heart is top-normal in size. The pulmonary vascularity is normal. There is calcification in the wall of the aortic arch. There is degenerative disc disease at multiple thoracic levels. IMPRESSION: COPD. No pneumonia nor  pleural effusion which is significant improvement since the previous study. Cardiomegaly without pulmonary edema. Aortic atherosclerosis. Electronically Signed   By: David  [place]az[place] M.D.   On: 06/12/2016 11:29   Ct Head Wo Contrast Result Date: 06/21/2016 CLINICAL DATA:  Unwitnessed fall. Syncope. Dizziness. History of diabetes, hypertension, coronary artery disease. EXAM: CT HEAD WITHOUT CONTRAST TECHNIQUE: Contiguous axial images were obtained from the base of the skull through the vertex without intravenous contrast. COMPARISON:  None. FINDINGS: Brain: Diffuse cerebral atrophy. Mild ventricular dilatation consistent with central atrophy. Patchy low-attenuation changes in the deep white matter consistent with small vessel ischemia. No mass effect or midline shift. No abnormal extra-axial fluid collections. Gray-white matter junctions are distinct. Basal cisterns are not effaced. No evidence of acute intracranial hemorrhage. Vascular: No hyperdense vessel or unexpected calcification. Skull: No depressed skull fractures. Sinuses/Orbits: Mucosal thickening in the paranasal sinuses. Opacification of the left sphenoid sinus. Mastoid air cells are not opacified. Other: None. IMPRESSION: No acute intracranial abnormalities. Mild chronic atrophy and small vessel ischemic changes. Electronically Signed   By: Burman Nieves M.D.   On: 06/21/2016 18:54   AVW:UJWJX bradycardia, rate 37  TELEMETRY: sinus rhythm, rate 70's  Assessment/Plan: 1.  Symptomatic sinus bradycardia The patient presented with syncope and symptomatic sinus bradycardia. Her rate controlling medications have been held for several days with no improvement in rates. She has required Dopamine to maintain HR.  Pacemaker implant is indicated at this time. Risks, benefits reviewed with patient who wishes to proceed. Paddy Walthall plan for later today.  She has a reduced LVEF with underlying RBBB and normal PR. Her primary  issue is sinus bradycardia. We have not  seen AV block. Levita Monical defer decision for LV lead to Dr Elberta Fortis.   2.  Persistent atrial fibrillation Currently in SR but required AVN blocking agents in the past for RVR PPM implant as above Continue Eliquis for CHADS2VASC of 5  3.  Chronic combined systolic and diastolic heart failure Euvolemic on exam BB currently on hold  4.  HTN Stable on Dopamine   Dr Elberta Fortis to see later today   Signed, Kathy Balsam, NP 06/26/2016 7:52 AM      I have seen and examined this patient with [name].  Agree with above, note added to reflect my findings.  On exam, regular rhythm, no murmurs, lungs clear.  Presented with syncope found to have sinus bradycardia and now requiring dopamine for HR augmentation.  No reversible causes found.  Plan for pacemaker placement today.  Risks and benefits discussed.  Patient and family understand the risks and have agreed to the procedure.    [name] M. Tyliah Schlereth MD 06/26/2016 10:07 AM

## 2016-06-26 NOTE — Progress Notes (Signed)
Morning labs not collected. When called Lab said they did not see any orders for labs. Stat orders placed to draw morning labs

## 2016-06-26 NOTE — Discharge Summary (Signed)
ELECTROPHYSIOLOGY PROCEDURE DISCHARGE SUMMARY    Patient ID: Kathy CuriaVirginia L Page,  MRN: [id number], DOB/AGE: 80/06/1931 80 y.o.  Admit date: 06/21/2016 Discharge date: 06/27/2016  Primary Care Physician: Dwana MelenaZack Hall, MD Primary Cardiologist: Diona BrownerMcDowell Electrophysiologist: Elberta Fortisamnitz  Primary Discharge Diagnosis:  Symptomatic sinus bradycardia status post pacemaker implantation this admission  Secondary Discharge Diagnosis:  1.  Persistent atrial fibrillation 2.  Hypertension 3.  Diabetes 4.  CAD   Allergies  Allergen Reactions  . Codeine Nausea Only  . Heparin Other (See Comments)    Causes internal bleeding      Procedures This Admission:  1.  Implantation of a MDT dual chamber PPM on 06/26/16 by Dr Elberta Fortisamnitz.  The patient received a MDT model number Advisa PPM with model number 5076 right atrial lead and 5076 right ventricular lead. There were no immediate post procedure complications. 2.  CXR on 06/27/16 demonstrated no pneumothorax status post device implantation.   Brief HPI/Hospital Course:  [name] is a 82 y.o. female with a past medical history as outlined above. [facility] with recurrent syncope, exercise intolerance and fatigue. She was found to have sinus bradycardia. Her rate slowing agents were held and she required dopamine to maintain HR.  Sinus rates did not improve despite washout of medications.  [facility]  The patient has had symptomatic bradycardia without reversible causes identified.  Risks, benefits, and alternatives to PPM implantation were reviewed with the patient who wished to proceed. The patient underwent implantation of a MDT dual chamber pacemaker with details as outlined above.  She  was monitored on telemetry overnight which demonstrated atrial pacing with intrinsic ventricular conduction.  Left chest was without hematoma or ecchymosis.  The device was interrogated and found to be functioning  normally.  CXR was obtained and demonstrated no pneumothorax status post device implantation.  Wound care, arm mobility, and restrictions were reviewed with the patient.  The patient was examined and considered stable for discharge to home.    Physical Exam: Vitals:   06/26/16 1734 06/26/16 1821 06/26/16 2045 06/27/16 0500  BP: (!) 121/42 (!) 114/55 (!) 114/44 (!) 138/58  Pulse: 70 69 70 69  Resp: (!) 22 (!) 22 (!) 21 (!) 22  Temp:   98.7 F (37.1 C) 98.7 F (37.1 C)  TempSrc:   Oral Oral  SpO2: 98% 96% 99% 94%  Weight:    145 lb 12.8 oz (66.1 kg)  Height:        GEN- The patient is elderly appearing, alert and oriented x 3 today.   HEENT: normocephalic, atraumatic; sclera clear, conjunctiva pink; hearing intact; oropharynx clear; neck supple  Lungs- Clear to ausculation bilaterally, normal work of breathing.  No wheezes, rales, rhonchi Heart- Regular rate and rhythm (paced) GI- soft, non-tender, non-distended, bowel sounds present  Extremities- no clubbing, cyanosis, or edema  MS- no significant deformity or atrophy Skin- warm and dry, no rash or lesion, left chest without hematoma/ecchymosis Psych- euthymic mood, full affect Neuro- strength and sensation are intact   Labs:   Lab Results  Component Value Date   WBC 6.1 06/26/2016   HGB 11.1 (L) 06/26/2016   HCT 36.2 06/26/2016   MCV 89.6 06/26/2016   PLT 150 06/26/2016    Recent Labs Lab 06/21/16 1654  06/26/16 0707  NA 139  < > 141  K 4.8  < > 3.7  CL 101  < > 102  CO2 32  < >  31  BUN 19  < > 12  CREATININE 0.88  < > 0.82  CALCIUM 9.0  < > 9.0  PROT 6.4*  --   --   BILITOT 0.8  --   --   ALKPHOS 59  --   --   ALT 15  --   --   AST 15  --   --   GLUCOSE 191*  < > 170*  < > = values in this interval not displayed.  Discharge Medications:    Medication List    STOP taking these medications   digoxin 0.25 MG tablet Commonly known as:  LANOXIN     TAKE these medications   apixaban 5 MG Tabs  tablet Commonly known as:  ELIQUIS Take 1 tablet (5 mg total) by mouth 2 (two) times daily.   bisoprolol 5 MG tablet Commonly known as:  ZEBETA Take 1 tablet (5 mg total) by mouth daily.   CENTRUM SILVER 50+WOMEN PO Take 1 tablet by mouth daily.   escitalopram 20 MG tablet Commonly known as:  LEXAPRO Take 10 mg by mouth daily.   furosemide 40 MG tablet Commonly known as:  LASIX Take 40 mg by mouth daily. Daily at 0800 am   ipratropium-albuterol 0.5-2.5 (3) MG/3ML Soln Commonly known as:  DUONEB Take 3 mLs by nebulization 3 (three) times daily. What changed:  when to take this  reasons to take this   LORazepam 0.5 MG tablet Commonly known as:  ATIVAN Take 1 tablet (0.5 mg total) by mouth daily. Take one tablet by mouth once daily What changed:  when to take this  additional instructions   nitroGLYCERIN 0.4 MG SL tablet Commonly known as:  NITROSTAT Place 1 tablet (0.4 mg total) under the tongue every 5 (five) minutes as needed for chest pain.   NON FORMULARY Bipap qhs with IPAP of 20 and EPAP of 5. Tidal volume of 600 and respiratory rate of 16. Twice a day   omeprazole 20 MG capsule Commonly known as:  PRILOSEC Take 1 capsule (20 mg total) by mouth 2 (two) times daily before a meal.   OXYGEN Inhale 2.5 L into the lungs continuous.   potassium chloride 10 MEQ tablet Commonly known as:  K-DUR,KLOR-CON Take 10 mEq by mouth 2 (two) times daily.   vitamin B-12 1000 MCG tablet Commonly known as:  CYANOCOBALAMIN Take 1,000 mcg by mouth daily.       Disposition:  Discharge Instructions    Diet - low sodium heart healthy    Complete by:  As directed   Increase activity slowly    Complete by:  As directed     Follow-up Information    [facility] [employer] [facility] Follow up on 07/07/2016.   Specialty:  Cardiology Why:  at 4:30PM for wound check  Contact information: [address] [place] [address] [phone]        Lewayne Bunting, MD Follow up on 09/25/2016.   Specialty:  Cardiology Why:  at 8:30AM  Contact information: [address] [place] [address] [phone]           Duration of Discharge Encounter: Greater than 30 minutes including physician time.  Signed, Gypsy Balsam, NP 06/27/2016 7:49 AM  I have seen and examined this patient with Gypsy Balsam.  Agree with above, note added to reflect my findings.  On exam, regular rhythm, no murmurs, lungs clear.  Had dual chamber pacemaker placed for sinus node dysfunction.  CXR and interrogation with stable lead parameters.  Plan for discharge today with follow up in device clinic.    Dorita Rowlands M. Phyllip Claw MD 06/27/2016 10:49 AM

## 2016-06-27 ENCOUNTER — Encounter (HOSPITAL_COMMUNITY): Payer: Self-pay | Admitting: Cardiology

## 2016-06-27 ENCOUNTER — Inpatient Hospital Stay (HOSPITAL_COMMUNITY): Payer: Medicare Other

## 2016-06-27 LAB — BLOOD GAS, ARTERIAL
ACID-BASE EXCESS: 8.2 mmol/L — AB (ref 0.0–2.0)
BICARBONATE: 32.4 mmol/L — AB (ref 20.0–28.0)
Drawn by: 280981
O2 CONTENT: 2.5 L/min
O2 SAT: 97.8 %
PATIENT TEMPERATURE: 98.6
PCO2 ART: 46.7 mmHg (ref 32.0–48.0)
PO2 ART: 101 mmHg (ref 83.0–108.0)
pH, Arterial: 7.455 — ABNORMAL HIGH (ref 7.350–7.450)

## 2016-06-27 LAB — GLUCOSE, CAPILLARY
GLUCOSE-CAPILLARY: 166 mg/dL — AB (ref 65–99)
Glucose-Capillary: 128 mg/dL — ABNORMAL HIGH (ref 65–99)

## 2016-06-27 MED FILL — Gentamicin Sulfate Inj 40 MG/ML: INTRAMUSCULAR | Qty: 2 | Status: AC

## 2016-06-27 MED FILL — Cefazolin Sodium-Dextrose IV Solution 2 GM/100ML-4%: INTRAVENOUS | Qty: 100 | Status: AC

## 2016-06-27 MED FILL — Heparin Sodium (Porcine) 2 Unit/ML in Sodium Chloride 0.9%: INTRAMUSCULAR | Qty: 500 | Status: AC

## 2016-06-27 MED FILL — Sodium Chloride Irrigation Soln 0.9%: Qty: 500 | Status: AC

## 2016-06-27 NOTE — Progress Notes (Signed)
Per pt family at bedside, pt was in hospital about 3 months ago with "fluid around lungs, which was drained".  [facility]    [facility]  Pt states she felt lightheaded.  Wears 2L Oxygen at home.  Also feeling slightly nauseated.  Ginger Ale and crackers given to pt.  Did not eat breakfast this am.  Only had a glass of Orange Juice.    PT eval ordered.

## 2016-06-27 NOTE — Care Management Note (Addendum)
Case Management Note  Patient Details  Name: [name] MRN: [id number] Date of Birth: 01/04/1931  Subjective/Objective:   [facility]. Post pacemaker. [facility]. Pt states she has family that will be able to stay with her for a little and they check in on her often.          Action/Plan: CM did call [facility] and the services listed above were stopped by pt's request on August 3rd. [facility]. PT/OT to consult. Will continue to monitor. Pt has DME RW, Cane and 02 @ 2.5L. Pt has 02 to travel back home via car. Will continue to f/u.   Expected Discharge Date:                  Expected Discharge Plan:  Home w Home Health Services  In-House Referral:  Clinical Social Work  Discharge planning Services  CM Consult  Post Acute Care Choice:  Home Health Choice offered to:  Patient  DME Arranged:  N/A DME Agency:  NA  HH Arranged:  RN, PT HH Agency:  [facility]  Status of Service:  In process, will continue to follow  If discussed at Long Length of Stay Meetings, dates discussed:    Additional Comments: 1237 06-27-16 Tomi BambergerBrenda Graves-Bigelow, RN,BSN [phone] CM did speak with pt/ family [facility]. [facility] due to has used in the past. Referral made to [name] [facility] and [facility] to begin within 24-48 hours post d/c. Per daughters they will be able to stay with pt over the weekend until she gets stronger. Family to provide transportation home. No further needs from CM @ this time.  [name], [name], RN 06/27/2016, 10:47 AM

## 2016-06-27 NOTE — Progress Notes (Signed)
Pt d/c home with family, who will stay with her for the next 10 days.  All d/c instructions including meds and left arm restrictions given to pt and family.  Pt in no acute distress, with no nausea and only very slightly lightheaded.  Pt has walker and BSC at home.  Pt on 2 L oxygen at home.  Wears Bipap during night.  Instructions given to continue Bipap, per orders of prescriber.  Pt and family agree.

## 2016-06-27 NOTE — Care Management Important Message (Signed)
Important Message  Patient Details  Name: Kathy CuriaVirginia L Page MRN: [id number] Date of Birth: 11/07/1930   Medicare Important Message Given:  Yes    [name], [name] 06/27/2016, 11:33 AM

## 2016-06-27 NOTE — Plan of Care (Signed)
Problem: Education: Goal: Knowledge of [facility] Education information/materials will improve Outcome: Progressing Patient aware of plan of care.  RN provided medication education on all medications administered thus far this shift.  Patient stated understanding.  Patient has made no attempts to get out of bed thus far this shift.  Patient did complain of a headache earlier this shift, Tylenol administered per patient request, effective (see MAR and flowsheets for detailed information).

## 2016-06-27 NOTE — Evaluation (Signed)
Physical Therapy Evaluation Patient Details Name: [name] MRN: [id number] DOB: April 25, 1931 Today's Date: 06/27/2016   History of Present Illness  82 yo female with recent pacemaker implant came from home with UTI and bradycardia.  PMHx:  bradycardia, COPD, asthma, a-fib, HTN, MI, DM  Clinical Impression  Pt is up to walk with assistance and having some difficulty with controlling balance due to being limited with LUE.  Sling is off but has no orders permitting wbing and has the usual pacemaker precautions with no overhead reaching.  Her plan is to transition with HHPT to follow her and to focus for now acutely on standing and  Walking control as her condition allows.  Pulses were controlled with effort, O2 sats 97% and higher.    Follow Up Recommendations Home health PT;Supervision/Assistance - 24 hour    Equipment Recommendations  None recommended by PT    Recommendations for Other Services Rehab consult     Precautions / Restrictions Precautions Precautions: Fall (telemetry) Restrictions Weight Bearing Restrictions: Yes LUE Weight Bearing: Non weight bearing      Mobility  Bed Mobility Overal bed mobility: Needs Assistance Bed Mobility: Supine to Sit;Sit to Supine     Supine to sit: Min assist Sit to supine: Min assist   General bed mobility comments: Pt needs reminding about her LUE as she is trying to use it for all her  Transfers Overall transfer level: Needs assistance Equipment used: 1 person hand held assist (on RUE to avoid stress to LUE) Transfers: Sit to/from [employer] Sit to Stand: Min assist Stand pivot transfers: Min assist       General transfer comment: Pt is getting up to stand with RUE steadying and reminders not to use LUE  Ambulation/Gait             General Gait Details: Became light headed and nauseated, did not take a longer walk  Stairs            Wheelchair Mobility    Modified Rankin (Stroke  Patients Only)       Balance Overall balance assessment: Needs assistance Sitting-balance support: Feet supported Sitting balance-Leahy Scale: Fair   Postural control: Posterior lean Standing balance support: Single extremity supported Standing balance-Leahy Scale: Poor Standing balance comment: min assist to control balance                             Pertinent Vitals/Pain Pain Assessment: No/denies pain    Home Living Family/patient expects to be discharged to:: Private residence Living Arrangements: Spouse/significant other Available Help at Discharge: Friend(s) Type of Home: House Home Access: Ramped entrance     Home Layout: One level Home Equipment: Environmental consultant - 2 wheels;Cane - single point Additional Comments: Home O2    Prior Function Level of Independence: Independent with assistive device(s)               Hand Dominance        Extremity/Trunk Assessment   Upper Extremity Assessment: LUE deficits/detail       LUE Deficits / Details: L arm taken out of sling but is not permitted WB yt   Lower Extremity Assessment: Generalized weakness      Cervical / Trunk Assessment: Kyphotic  Communication   Communication: No difficulties  Cognition Arousal/Alertness: Awake/alert Behavior During Therapy: WFL for tasks assessed/performed Overall Cognitive Status: Within Functional Limits for tasks assessed  General Comments General comments (skin integrity, edema, etc.): Pt is getting up to walk with short steps to chair [facility], then back to bed due to nausea.  Pt is not able to walk far but clearly does not need much help.    Exercises        Assessment/Plan    PT Assessment Patient needs continued PT services  PT Diagnosis Generalized weakness   PT Problem List Decreased strength;Decreased range of motion;Decreased activity tolerance;Decreased balance;Decreased mobility;Decreased  coordination;Cardiopulmonary status limiting activity;Decreased skin integrity (has LUE limitations)  PT Treatment Interventions DME instruction;Gait training;Functional mobility training;Therapeutic activities;Therapeutic exercise;Balance training;Neuromuscular re-education;Patient/family education   PT Goals (Current goals can be found in the Care Plan section) Acute Rehab PT Goals Patient Stated Goal: to get to feeling better PT Goal Formulation: With patient/family Time For Goal Achievement: 07/11/16 Potential to Achieve Goals: Good    Frequency Min 3X/week   Barriers to discharge Other (comment) (has daughters coming to stay with her)      Co-evaluation               End of Session Equipment Utilized During Treatment: Oxygen (on O2 at  home) Activity Tolerance: Treatment limited secondary to medical complications (Comment) (dizziness and light headed, nauseated) Patient left: in bed;with call bell/phone within reach;with family/visitor present;with nursing/sitter in room Nurse Communication: Mobility status         Time: 1142-1209 PT Time Calculation (min) (ACUTE ONLY): 27 min   Charges:   PT Evaluation $PT Eval Moderate Complexity: 1 Procedure PT Treatments $Therapeutic Activity: 8-22 mins   PT G Codes:        Ivar DrapeStout, Joshaua Epple E 06/27/2016, 1:08 PM   Samul Dadauth Kyasia Steuck, PT MS Acute Rehab Dept. Number: [facility] [phone] and [facility] [phone]

## 2016-06-29 DIAGNOSIS — Z9981 Dependence on supplemental oxygen: Secondary | ICD-10-CM | POA: Diagnosis not present

## 2016-06-29 DIAGNOSIS — J441 Chronic obstructive pulmonary disease with (acute) exacerbation: Secondary | ICD-10-CM | POA: Diagnosis not present

## 2016-06-29 DIAGNOSIS — J9611 Chronic respiratory failure with hypoxia: Secondary | ICD-10-CM | POA: Diagnosis not present

## 2016-06-29 DIAGNOSIS — J449 Chronic obstructive pulmonary disease, unspecified: Secondary | ICD-10-CM | POA: Diagnosis not present

## 2016-06-29 DIAGNOSIS — I509 Heart failure, unspecified: Secondary | ICD-10-CM | POA: Diagnosis not present

## 2016-06-29 DIAGNOSIS — I4891 Unspecified atrial fibrillation: Secondary | ICD-10-CM | POA: Diagnosis not present

## 2016-06-29 DIAGNOSIS — Z7901 Long term (current) use of anticoagulants: Secondary | ICD-10-CM | POA: Diagnosis not present

## 2016-06-29 DIAGNOSIS — E119 Type 2 diabetes mellitus without complications: Secondary | ICD-10-CM | POA: Diagnosis not present

## 2016-06-29 DIAGNOSIS — I2510 Atherosclerotic heart disease of native coronary artery without angina pectoris: Secondary | ICD-10-CM | POA: Diagnosis not present

## 2016-06-29 DIAGNOSIS — Z87440 Personal history of urinary (tract) infections: Secondary | ICD-10-CM | POA: Diagnosis not present

## 2016-06-29 DIAGNOSIS — I252 Old myocardial infarction: Secondary | ICD-10-CM | POA: Diagnosis not present

## 2016-06-29 DIAGNOSIS — I110 Hypertensive heart disease with heart failure: Secondary | ICD-10-CM | POA: Diagnosis not present

## 2016-06-29 DIAGNOSIS — I6523 Occlusion and stenosis of bilateral carotid arteries: Secondary | ICD-10-CM | POA: Diagnosis not present

## 2016-06-29 DIAGNOSIS — Z9181 History of falling: Secondary | ICD-10-CM | POA: Diagnosis not present

## 2016-06-29 DIAGNOSIS — J9612 Chronic respiratory failure with hypercapnia: Secondary | ICD-10-CM | POA: Diagnosis not present

## 2016-06-29 DIAGNOSIS — Z950 Presence of cardiac pacemaker: Secondary | ICD-10-CM | POA: Diagnosis not present

## 2016-06-29 DIAGNOSIS — E785 Hyperlipidemia, unspecified: Secondary | ICD-10-CM | POA: Diagnosis not present

## 2016-06-29 DIAGNOSIS — Z87891 Personal history of nicotine dependence: Secondary | ICD-10-CM | POA: Diagnosis not present

## 2016-06-29 DIAGNOSIS — Z48812 Encounter for surgical aftercare following surgery on the circulatory system: Secondary | ICD-10-CM | POA: Diagnosis not present

## 2016-06-29 DIAGNOSIS — D509 Iron deficiency anemia, unspecified: Secondary | ICD-10-CM | POA: Diagnosis not present

## 2016-06-30 DIAGNOSIS — I110 Hypertensive heart disease with heart failure: Secondary | ICD-10-CM | POA: Diagnosis not present

## 2016-06-30 DIAGNOSIS — J9611 Chronic respiratory failure with hypoxia: Secondary | ICD-10-CM | POA: Diagnosis not present

## 2016-06-30 DIAGNOSIS — Z48812 Encounter for surgical aftercare following surgery on the circulatory system: Secondary | ICD-10-CM | POA: Diagnosis not present

## 2016-06-30 DIAGNOSIS — I4891 Unspecified atrial fibrillation: Secondary | ICD-10-CM | POA: Diagnosis not present

## 2016-06-30 DIAGNOSIS — J449 Chronic obstructive pulmonary disease, unspecified: Secondary | ICD-10-CM | POA: Diagnosis not present

## 2016-06-30 DIAGNOSIS — I509 Heart failure, unspecified: Secondary | ICD-10-CM | POA: Diagnosis not present

## 2016-07-01 DIAGNOSIS — Z48812 Encounter for surgical aftercare following surgery on the circulatory system: Secondary | ICD-10-CM | POA: Diagnosis not present

## 2016-07-01 DIAGNOSIS — I509 Heart failure, unspecified: Secondary | ICD-10-CM | POA: Diagnosis not present

## 2016-07-01 DIAGNOSIS — I110 Hypertensive heart disease with heart failure: Secondary | ICD-10-CM | POA: Diagnosis not present

## 2016-07-01 DIAGNOSIS — I4891 Unspecified atrial fibrillation: Secondary | ICD-10-CM | POA: Diagnosis not present

## 2016-07-01 DIAGNOSIS — J449 Chronic obstructive pulmonary disease, unspecified: Secondary | ICD-10-CM | POA: Diagnosis not present

## 2016-07-01 DIAGNOSIS — J9611 Chronic respiratory failure with hypoxia: Secondary | ICD-10-CM | POA: Diagnosis not present

## 2016-07-03 DIAGNOSIS — I4891 Unspecified atrial fibrillation: Secondary | ICD-10-CM | POA: Diagnosis not present

## 2016-07-03 DIAGNOSIS — I110 Hypertensive heart disease with heart failure: Secondary | ICD-10-CM | POA: Diagnosis not present

## 2016-07-03 DIAGNOSIS — I509 Heart failure, unspecified: Secondary | ICD-10-CM | POA: Diagnosis not present

## 2016-07-03 DIAGNOSIS — J9611 Chronic respiratory failure with hypoxia: Secondary | ICD-10-CM | POA: Diagnosis not present

## 2016-07-03 DIAGNOSIS — Z48812 Encounter for surgical aftercare following surgery on the circulatory system: Secondary | ICD-10-CM | POA: Diagnosis not present

## 2016-07-03 DIAGNOSIS — J449 Chronic obstructive pulmonary disease, unspecified: Secondary | ICD-10-CM | POA: Diagnosis not present

## 2016-07-04 ENCOUNTER — Inpatient Hospital Stay (HOSPITAL_COMMUNITY): Admission: RE | Admit: 2016-07-04 | Payer: Medicare Other | Source: Ambulatory Visit

## 2016-07-04 ENCOUNTER — Ambulatory Visit: Payer: Medicare Other | Admitting: Internal Medicine

## 2016-07-07 ENCOUNTER — Ambulatory Visit (INDEPENDENT_AMBULATORY_CARE_PROVIDER_SITE_OTHER): Payer: Medicare Other | Admitting: *Deleted

## 2016-07-07 DIAGNOSIS — I480 Paroxysmal atrial fibrillation: Secondary | ICD-10-CM

## 2016-07-07 DIAGNOSIS — Z950 Presence of cardiac pacemaker: Secondary | ICD-10-CM | POA: Diagnosis not present

## 2016-07-07 LAB — CUP PACEART INCLINIC DEVICE CHECK
Brady Statistic AP VP Percent: 0.09 %
Brady Statistic AS VS Percent: 10.47 %
Brady Statistic RV Percent Paced: 0.1 %
Implantable Lead Implant Date: 20170907
Implantable Lead Location: 753859
Lead Channel Impedance Value: 456 Ohm
Lead Channel Pacing Threshold Amplitude: 0.75 V
Lead Channel Pacing Threshold Pulse Width: 0.4 ms
Lead Channel Pacing Threshold Pulse Width: 0.4 ms
Lead Channel Sensing Intrinsic Amplitude: 4.8 mV
Lead Channel Setting Pacing Amplitude: 3.5 V
Lead Channel Setting Pacing Amplitude: 3.5 V
Lead Channel Setting Pacing Pulse Width: 0.4 ms
MDC IDC LEAD IMPLANT DT: 20170907
MDC IDC LEAD LOCATION: 753860
MDC IDC MSMT BATTERY VOLTAGE: 3.02 V
MDC IDC MSMT LEADCHNL RA IMPEDANCE VALUE: 361 Ohm
MDC IDC MSMT LEADCHNL RA PACING THRESHOLD AMPLITUDE: 0.75 V
MDC IDC MSMT LEADCHNL RA SENSING INTR AMPL: 3.4 mV
MDC IDC MSMT LEADCHNL RV IMPEDANCE VALUE: 494 Ohm
MDC IDC MSMT LEADCHNL RV IMPEDANCE VALUE: 646 Ohm
MDC IDC SESS DTM: 20170918173756
MDC IDC SET LEADCHNL RV SENSING SENSITIVITY: 0.45 mV
MDC IDC STAT BRADY AP VS PERCENT: 89.43 %
MDC IDC STAT BRADY AS VP PERCENT: 0.02 %
MDC IDC STAT BRADY RA PERCENT PACED: 89.52 %

## 2016-07-07 NOTE — Progress Notes (Signed)
Wound check appointment. Steri-strips removed. Wound without redness or edema. Incision edges approximated, wound well healed. Normal device function. Thresholds, sensing, and impedances consistent with implant measurements. Device programmed at 3.5V with auto capture on for extra safety margin until 3 month visit. Histogram distribution appropriate for patient and level of activity. No mode switches or high ventricular rates noted. Patient educated about wound care, arm mobility, lifting restrictions. ROV with GT on 09/25/16.

## 2016-07-08 DIAGNOSIS — I509 Heart failure, unspecified: Secondary | ICD-10-CM | POA: Diagnosis not present

## 2016-07-08 DIAGNOSIS — I4891 Unspecified atrial fibrillation: Secondary | ICD-10-CM | POA: Diagnosis not present

## 2016-07-08 DIAGNOSIS — I110 Hypertensive heart disease with heart failure: Secondary | ICD-10-CM | POA: Diagnosis not present

## 2016-07-08 DIAGNOSIS — Z48812 Encounter for surgical aftercare following surgery on the circulatory system: Secondary | ICD-10-CM | POA: Diagnosis not present

## 2016-07-08 DIAGNOSIS — J449 Chronic obstructive pulmonary disease, unspecified: Secondary | ICD-10-CM | POA: Diagnosis not present

## 2016-07-08 DIAGNOSIS — J9611 Chronic respiratory failure with hypoxia: Secondary | ICD-10-CM | POA: Diagnosis not present

## 2016-07-09 DIAGNOSIS — I509 Heart failure, unspecified: Secondary | ICD-10-CM | POA: Diagnosis not present

## 2016-07-09 DIAGNOSIS — J449 Chronic obstructive pulmonary disease, unspecified: Secondary | ICD-10-CM | POA: Diagnosis not present

## 2016-07-09 DIAGNOSIS — J9611 Chronic respiratory failure with hypoxia: Secondary | ICD-10-CM | POA: Diagnosis not present

## 2016-07-09 DIAGNOSIS — Z48812 Encounter for surgical aftercare following surgery on the circulatory system: Secondary | ICD-10-CM | POA: Diagnosis not present

## 2016-07-09 DIAGNOSIS — I4891 Unspecified atrial fibrillation: Secondary | ICD-10-CM | POA: Diagnosis not present

## 2016-07-09 DIAGNOSIS — I110 Hypertensive heart disease with heart failure: Secondary | ICD-10-CM | POA: Diagnosis not present

## 2016-07-10 DIAGNOSIS — I509 Heart failure, unspecified: Secondary | ICD-10-CM | POA: Diagnosis not present

## 2016-07-10 DIAGNOSIS — J9611 Chronic respiratory failure with hypoxia: Secondary | ICD-10-CM | POA: Diagnosis not present

## 2016-07-10 DIAGNOSIS — J449 Chronic obstructive pulmonary disease, unspecified: Secondary | ICD-10-CM | POA: Diagnosis not present

## 2016-07-10 DIAGNOSIS — I110 Hypertensive heart disease with heart failure: Secondary | ICD-10-CM | POA: Diagnosis not present

## 2016-07-10 DIAGNOSIS — I4891 Unspecified atrial fibrillation: Secondary | ICD-10-CM | POA: Diagnosis not present

## 2016-07-10 DIAGNOSIS — Z48812 Encounter for surgical aftercare following surgery on the circulatory system: Secondary | ICD-10-CM | POA: Diagnosis not present

## 2016-07-14 DIAGNOSIS — I509 Heart failure, unspecified: Secondary | ICD-10-CM | POA: Diagnosis not present

## 2016-07-14 DIAGNOSIS — Z48812 Encounter for surgical aftercare following surgery on the circulatory system: Secondary | ICD-10-CM | POA: Diagnosis not present

## 2016-07-14 DIAGNOSIS — J449 Chronic obstructive pulmonary disease, unspecified: Secondary | ICD-10-CM | POA: Diagnosis not present

## 2016-07-14 DIAGNOSIS — I110 Hypertensive heart disease with heart failure: Secondary | ICD-10-CM | POA: Diagnosis not present

## 2016-07-14 DIAGNOSIS — I4891 Unspecified atrial fibrillation: Secondary | ICD-10-CM | POA: Diagnosis not present

## 2016-07-14 DIAGNOSIS — J9611 Chronic respiratory failure with hypoxia: Secondary | ICD-10-CM | POA: Diagnosis not present

## 2016-07-16 ENCOUNTER — Telehealth: Payer: Self-pay | Admitting: Internal Medicine

## 2016-07-16 MED ORDER — BISOPROLOL FUMARATE 5 MG PO TABS: 5.0000 mg | ORAL_TABLET | Freq: Every day | ORAL | 0 refills | Status: DC

## 2016-07-16 NOTE — Telephone Encounter (Signed)
Called spoke with pt. Aware refill sent in. Nothing further needed.  

## 2016-07-17 DIAGNOSIS — J9611 Chronic respiratory failure with hypoxia: Secondary | ICD-10-CM | POA: Diagnosis not present

## 2016-07-17 DIAGNOSIS — Z48812 Encounter for surgical aftercare following surgery on the circulatory system: Secondary | ICD-10-CM | POA: Diagnosis not present

## 2016-07-17 DIAGNOSIS — I509 Heart failure, unspecified: Secondary | ICD-10-CM | POA: Diagnosis not present

## 2016-07-17 DIAGNOSIS — I4891 Unspecified atrial fibrillation: Secondary | ICD-10-CM | POA: Diagnosis not present

## 2016-07-17 DIAGNOSIS — J449 Chronic obstructive pulmonary disease, unspecified: Secondary | ICD-10-CM | POA: Diagnosis not present

## 2016-07-17 DIAGNOSIS — I110 Hypertensive heart disease with heart failure: Secondary | ICD-10-CM | POA: Diagnosis not present

## 2016-07-21 ENCOUNTER — Telehealth: Payer: Self-pay | Admitting: Internal Medicine

## 2016-07-21 DIAGNOSIS — I951 Orthostatic hypotension: Secondary | ICD-10-CM | POA: Diagnosis not present

## 2016-07-21 DIAGNOSIS — Z23 Encounter for immunization: Secondary | ICD-10-CM | POA: Diagnosis not present

## 2016-07-21 NOTE — Telephone Encounter (Signed)
That's fine as long as long as no hypersomnolence or am HA's developing (as per my last set of instructions)

## 2016-07-21 NOTE — Telephone Encounter (Signed)
Spoke with Tesuqueourtney and advised. She will contact pt and DME. She will call us if order needed. Nothing further needed at this time.

## 2016-07-21 NOTE — Telephone Encounter (Signed)
[facility]lene GlossHall's office. She states that pt was recently admitted for 9 days and did not use BiPAP at all while in hospital. She states that pt has had pacemaker placed and is doing well. They would like to make sure you are ok to d/c BiPAP use.   (Per Toni AmendCourtney ok to leave detailed message on her cell phone.)  MW - Please advise. Thanks!    LOV 06/12/16  Instructions   Patient Instructions    Stop corevidol and take bisoprolol 5 mg one daily   Ok to use the bipap just half the night for now but use the full night if any am drowsiness/headache  Ok to just use the duoneb up to every 4 hours if needed for breathing  Change omeprazole to Take 30- 60 min before your first and last meals of the day   GERD (REFLUX)  is an extremely common cause of respiratory symptoms just like yours , many times with no obvious heartburn at all.    It can be treated with medication, but also with lifestyle changes including elevation of the head of your bed (ideally with 6 inch  bed blocks),  Smoking cessation, avoidance of late meals, excessive alcohol, and avoid fatty foods, chocolate, peppermint, colas, red wine, and acidic juices such as orange juice.  NO MINT OR MENTHOL PRODUCTS SO NO COUGH DROPS   USE SUGARLESS CANDY INSTEAD (Jolley ranchers or Stover's or Life Savers) or even ice chips will also do - the key is to swallow to prevent all throat clearing. NO OIL BASED VITAMINS - use powdered substitutes.    Please remember to go to the lab and x-ray department downstairs for your tests - we will call you with the results when they are available  Please schedule a follow up office visit in 2 weeks, sooner [facility]

## 2016-07-21 NOTE — Telephone Encounter (Signed)
Not sure who Toni AmendCourtney is... LMTCB

## 2016-08-06 DIAGNOSIS — R7301 Impaired fasting glucose: Secondary | ICD-10-CM | POA: Diagnosis not present

## 2016-08-06 DIAGNOSIS — I482 Chronic atrial fibrillation: Secondary | ICD-10-CM | POA: Diagnosis not present

## 2016-08-06 DIAGNOSIS — K700 Alcoholic fatty liver: Secondary | ICD-10-CM | POA: Diagnosis not present

## 2016-08-06 DIAGNOSIS — D509 Iron deficiency anemia, unspecified: Secondary | ICD-10-CM | POA: Diagnosis not present

## 2016-08-06 DIAGNOSIS — I10 Essential (primary) hypertension: Secondary | ICD-10-CM | POA: Diagnosis not present

## 2016-08-06 DIAGNOSIS — E039 Hypothyroidism, unspecified: Secondary | ICD-10-CM | POA: Diagnosis not present

## 2016-08-06 DIAGNOSIS — E119 Type 2 diabetes mellitus without complications: Secondary | ICD-10-CM | POA: Diagnosis not present

## 2016-08-06 DIAGNOSIS — D518 Other vitamin B12 deficiency anemias: Secondary | ICD-10-CM | POA: Diagnosis not present

## 2016-08-11 DIAGNOSIS — J449 Chronic obstructive pulmonary disease, unspecified: Secondary | ICD-10-CM | POA: Diagnosis not present

## 2016-08-11 DIAGNOSIS — I482 Chronic atrial fibrillation: Secondary | ICD-10-CM | POA: Diagnosis not present

## 2016-08-11 DIAGNOSIS — I10 Essential (primary) hypertension: Secondary | ICD-10-CM | POA: Diagnosis not present

## 2016-08-11 DIAGNOSIS — E782 Mixed hyperlipidemia: Secondary | ICD-10-CM | POA: Diagnosis not present

## 2016-08-11 DIAGNOSIS — E119 Type 2 diabetes mellitus without complications: Secondary | ICD-10-CM | POA: Diagnosis not present

## 2016-08-11 DIAGNOSIS — I5032 Chronic diastolic (congestive) heart failure: Secondary | ICD-10-CM | POA: Diagnosis not present

## 2016-08-19 LAB — BLOOD GAS, ARTERIAL
ACID-BASE EXCESS: 24.8 mmol/L — AB (ref 0.0–2.0)
Bicarbonate: 47 mEq/L — ABNORMAL HIGH (ref 20.0–24.0)
DRAWN BY: 22223
Delivery systems: POSITIVE
EXPIRATORY PAP: 5
FIO2: 100
INSPIRATORY PAP: 20
O2 Saturation: 99 %
PCO2 ART: 94.5 mmHg — AB (ref 35.0–45.0)
PH ART: 7.358 (ref 7.350–7.450)
PO2 ART: 141 mmHg — AB (ref 80.0–100.0)
RATE: 14 resp/min
TCO2: 15.2 mmol/L (ref 0–100)

## 2016-09-24 ENCOUNTER — Other Ambulatory Visit: Payer: Self-pay | Admitting: Internal Medicine

## 2016-09-25 ENCOUNTER — Encounter: Payer: Self-pay | Admitting: Internal Medicine

## 2016-09-25 ENCOUNTER — Ambulatory Visit (INDEPENDENT_AMBULATORY_CARE_PROVIDER_SITE_OTHER): Payer: Medicare Other | Admitting: Internal Medicine

## 2016-09-25 VITALS — BP 106/60 | HR 74 | Ht 64.0 in | Wt 142.0 lb

## 2016-09-25 DIAGNOSIS — I480 Paroxysmal atrial fibrillation: Secondary | ICD-10-CM

## 2016-09-25 DIAGNOSIS — I6522 Occlusion and stenosis of left carotid artery: Secondary | ICD-10-CM | POA: Diagnosis not present

## 2016-09-25 DIAGNOSIS — Z950 Presence of cardiac pacemaker: Secondary | ICD-10-CM

## 2016-09-25 LAB — CUP PACEART INCLINIC DEVICE CHECK
Battery Remaining Longevity: 100 mo
Brady Statistic AP VP Percent: 0.07 %
Brady Statistic AP VS Percent: 98.51 %
Brady Statistic AS VP Percent: 0.02 %
Brady Statistic AS VS Percent: 1.4 %
Brady Statistic RV Percent Paced: 0.1 %
Implantable Lead Implant Date: 20170907
Implantable Lead Location: 753860
Implantable Lead Model: 5076
Implantable Lead Model: 5076
Lead Channel Impedance Value: 323 Ohm
Lead Channel Impedance Value: 456 Ohm
Lead Channel Impedance Value: 570 Ohm
Lead Channel Pacing Threshold Amplitude: 0.75 V
Lead Channel Pacing Threshold Pulse Width: 0.4 ms
Lead Channel Sensing Intrinsic Amplitude: 4.25 mV
Lead Channel Setting Pacing Amplitude: 2 V
Lead Channel Setting Pacing Pulse Width: 0.4 ms
Lead Channel Setting Sensing Sensitivity: 0.45 mV
MDC IDC LEAD IMPLANT DT: 20170907
MDC IDC LEAD LOCATION: 753859
MDC IDC MSMT BATTERY VOLTAGE: 3.02 V
MDC IDC MSMT LEADCHNL RA IMPEDANCE VALUE: 418 Ohm
MDC IDC MSMT LEADCHNL RA PACING THRESHOLD AMPLITUDE: 0.75 V
MDC IDC MSMT LEADCHNL RA SENSING INTR AMPL: 2.875 mV
MDC IDC MSMT LEADCHNL RV PACING THRESHOLD PULSEWIDTH: 0.4 ms
MDC IDC PG IMPLANT DT: 20170907
MDC IDC SESS DTM: 20171207085423
MDC IDC SET LEADCHNL RV PACING AMPLITUDE: 2.5 V
MDC IDC STAT BRADY RA PERCENT PACED: 98.57 %

## 2016-09-25 NOTE — Progress Notes (Signed)
HPI Mrs. Sharlot Gowdaowell is referred today for ongoing evaluation and management of her PPM. She is a pleasant 82 yo woman who underwnt PPM insertion by Dr. Elberta Fortisamnitz back in September. She has a h/o sinus node dysfunction and PAF. The patient has felt much better since her PPM was placed. No more syncope.  Allergies  Allergen Reactions  . Codeine Nausea Only  . Heparin Other (See Comments)    Causes internal bleeding      Current Outpatient Prescriptions  Medication Sig Dispense Refill  . apixaban (ELIQUIS) 5 MG TABS tablet Take 1 tablet (5 mg total) by mouth 2 (two) times daily. 60 tablet 0  . bisoprolol (ZEBETA) 5 MG tablet Take 1 tablet (5 mg total) by mouth daily. Pt needs appointment for further refills. 90 tablet 0  . escitalopram (LEXAPRO) 20 MG tablet Take 10 mg by mouth daily.     . furosemide (LASIX) 40 MG tablet Take 40 mg by mouth daily. Daily at 0800 am    . ipratropium-albuterol (DUONEB) 0.5-2.5 (3) MG/3ML SOLN Take 3 mLs by nebulization 3 (three) times daily. (Patient taking differently: Take 3 mLs by nebulization every 6 (six) hours as needed (for shortness of breath). ) 360 mL   . LORazepam (ATIVAN) 0.5 MG tablet Take 1 tablet (0.5 mg total) by mouth daily. Take one tablet by mouth once daily (Patient taking differently: Take 0.5 mg by mouth at bedtime. Take one tablet by mouth once daily) 30 tablet 0  . Multiple Vitamins-Minerals (CENTRUM SILVER 50+WOMEN PO) Take 1 tablet by mouth daily.    . nitroGLYCERIN (NITROSTAT) 0.4 MG SL tablet Place 1 tablet (0.4 mg total) under the tongue every 5 (five) minutes as needed for chest pain. 25 tablet 3  . NON FORMULARY Bipap qhs with IPAP of 20 and EPAP of 5. Tidal volume of 600 and respiratory rate of 16. Twice a day    . omeprazole (PRILOSEC) 20 MG capsule Take 1 capsule (20 mg total) by mouth 2 (two) times daily before a meal. 60 capsule 11  . OXYGEN Inhale 2.5 L into the lungs continuous.    . potassium chloride (K-DUR,KLOR-CON) 10  MEQ tablet Take 10 mEq by mouth 2 (two) times daily.    . vitamin B-12 (CYANOCOBALAMIN) 1000 MCG tablet Take 1,000 mcg by mouth daily.     No current facility-administered medications for this visit.      Past Medical History:  Diagnosis Date  . A-fib (HCC)   . Asthma   . COPD (chronic obstructive pulmonary disease) (HCC)    Oxygen dependent  . Coronary atherosclerosis of native coronary artery   . Essential hypertension, benign   . History of GI bleed    ulcer  . Hyperlipidemia   . Old inferior wall myocardial infarction 1999   [facility]  . Type 2 diabetes mellitus (HCC)     ROS:   All systems reviewed and negative except as noted in the HPI.   Past Surgical History:  Procedure Laterality Date  . Cesareen section    . CHOLECYSTECTOMY    . COLONOSCOPY  09/2004   RMR: For obscure GI bleed. Left-sided diverticula, otherwise normal ileocolonoscopy  . Coronary artery stent  1999   BMS x 3 RCA  ([facility]), PTCRA RC4  . EP IMPLANTABLE DEVICE N/A 06/26/2016   Procedure: Pacemaker Implant;  Surgeon: Will Jorja LoaMartin Camnitz, MD;  Location: [facility];  Service: Cardiovascular;  Laterality: N/A;  . ESOPHAGOGASTRODUODENOSCOPY  11  2005   RMR: For melena, normal exam.  . EXPLORATORY LAPAROTOMY    . GIVENS CAPSULE STUDY  08/2004   official report unavailable, but reported couple of AVMs, nonbleeding  . PARTIAL COLECTOMY  1977   blockage/gangrene? unclear if small bowel or colon  . THORACENTESIS Left 03/10/16   1 L transudative fluid     Family History  Problem Relation Age of Onset  . Diabetes Mother   . Diabetes Father   . Heart disease Father   . Cancer Sister     unknown  . Cancer Brother     unknown  . Diabetes Brother   . Colon cancer Neg Hx      Social History   Social History  . Marital status: Widowed    Spouse name: N/A  . Number of children: 2  . Years of education: N/A   Occupational History  . Not on file.   Social History Main Topics  . Smoking  status: Former Smoker    Packs/day: 2.00    Years: 40.00    Types: Cigarettes    Quit date: 01/13/1998  . Smokeless tobacco: Never Used  . Alcohol use No  . Drug use: No  . Sexual activity: Not on file   Other Topics Concern  . Not on file   Social History Narrative  . No narrative on file     BP 106/60   Pulse 74   Ht 5\' 4"  (1.626 m)   Wt 142 lb (64.4 kg)   SpO2 97%   BMI 24.37 kg/m   Physical Exam:  Well appearing 82 yo woman, NAD HEENT: Unremarkable Neck:  6 cm JVD, no thyromegally Lymphatics:  No adenopathy Back:  No CVA tenderness Lungs:  Clear with no wheezes HEART:  Regular rate rhythm, no murmurs, no rubs, no clicks Abd:  soft, positive bowel sounds, no organomegally, no rebound, no guarding Ext:  2 plus pulses, no edema, no cyanosis, no clubbing Skin:  No rashes no nodules Neuro:  CN II through XII intact, motor grossly intact  DEVICE  Normal device function.  See PaceArt for details.   Assess/Plan: 1. Sinus node dysfunction - she is asymptomatic, s/p PPM insertion. 2. PPM - her medtronic DDD PM is working normally. 3. HTN - her blood pressure is well controlled. 4. COPD - she will continue home oxygen. She is not wheezing today 5. CAD - she is s/p stenting remotely. No angina. Continue her current meds.  Leonia ReevesGregg Lourdez Mcgahan,M.D.

## 2016-09-25 NOTE — Patient Instructions (Addendum)
Your physician wants you to follow-up in: 9 You will receive a reminder letter in the mail two months in advance. If you don't receive a letter, please call our office to schedule the follow-up appointment.   Remote monitoring is used to monitor your Pacemaker of ICD from home on 12/25/16. This monitoring reduces the number of office visits required to check your device to one time per year. It allows us to keep an eye on the functioning of your device to ensure it is working properly. You are scheduled for a device check from home on 12/25/16. You may send your transmission at any time that day. If you have a wireless device, the transmission will be sent automatically. After your physician reviews your transmission, you will receive a postcard with your next transmission date.   Call device clinic for any questions [phone]      Thank you for choosing [facility] !

## 2016-10-30 ENCOUNTER — Encounter: Payer: Self-pay | Admitting: Cardiology

## 2016-10-30 NOTE — Progress Notes (Signed)
Cardiology Office Note  Date: 10/31/2016   ID: TORRY ADAMCZAK, DOB 26-Jul-1931, MRN [id number]  PCP: Wende Neighbors, MD  Primary Cardiologist: Rozann Lesches, MD   Chief Complaint  Patient presents with  . Atrial Fibrillation    History of Present Illness: [name] is an 83 y.o. female last seen by Ms. Lawrence NP in June 2017. I reviewed her interval records and updated the chart. She was evaluated in September 2017 with symptomatic bradycardia and sinus node dysfunction, ultimately underwent Medtronic pacemaker placement by Dr. Curt Bears and has had follow-up with Dr. Lovena Le in [place].  She presents today stating that she feels much better overall. I reviewed her medications, she has continued on Eliquis as well as Zebeta. Also continues on Lasix with potassium supplements.  Today we discussed her cardiac history in more detail, rationale for pacemaker placement, and plan to continue medical therapy. We will likely obtain a follow-up echocardiogram around the time of her next visit.  Past Medical History:  Diagnosis Date  . Asthma   . COPD (chronic obstructive pulmonary disease) ([place])    Oxygen dependent  . Coronary atherosclerosis of native coronary artery   . Essential hypertension, benign   . History of GI bleed    ulcer  . Hyperlipidemia   . Old inferior wall myocardial infarction 1999   [facility]  . PAF (paroxysmal atrial fibrillation) ([place])   . Sinus node dysfunction (HCC)    Medtronic PPM  . Type 2 diabetes mellitus ([place])     Past Surgical History:  Procedure Laterality Date  . Cesareen section    . CHOLECYSTECTOMY    . COLONOSCOPY  09/2004   RMR: For obscure GI bleed. Left-sided diverticula, otherwise normal ileocolonoscopy  . Coronary artery stent  1999   BMS x 3 RCA  ([place]), PTCRA RC4  . EP IMPLANTABLE DEVICE N/A 06/26/2016   Procedure: Pacemaker Implant;  Surgeon: Will Meredith Leeds, MD;  Location: [facility] CV LAB;  Service: Cardiovascular;   Laterality: N/A;  . ESOPHAGOGASTRODUODENOSCOPY  11 2005   RMR: For melena, normal exam.  . EXPLORATORY LAPAROTOMY    . GIVENS CAPSULE STUDY  08/2004   official report unavailable, but reported couple of AVMs, nonbleeding  . PARTIAL COLECTOMY  1977   blockage/gangrene? unclear if small bowel or colon  . THORACENTESIS Left 03/10/16   1 L transudative fluid    Current Outpatient Prescriptions  Medication Sig Dispense Refill  . apixaban (ELIQUIS) 5 MG TABS tablet Take 1 tablet (5 mg total) by mouth 2 (two) times daily. 60 tablet 0  . bisoprolol (ZEBETA) 5 MG tablet Take 1 tablet (5 mg total) by mouth daily. Pt needs appointment for further refills. 90 tablet 0  . escitalopram (LEXAPRO) 20 MG tablet Take 10 mg by mouth daily.     . furosemide (LASIX) 40 MG tablet Take 40 mg by mouth daily. Daily at 0800 am    . ipratropium-albuterol (DUONEB) 0.5-2.5 (3) MG/3ML SOLN Take 3 mLs by nebulization 3 (three) times daily. (Patient taking differently: Take 3 mLs by nebulization every 6 (six) hours as needed (for shortness of breath). ) 360 mL   . LORazepam (ATIVAN) 0.5 MG tablet Take 1 tablet (0.5 mg total) by mouth daily. Take one tablet by mouth once daily (Patient taking differently: Take 0.5 mg by mouth at bedtime. Take one tablet by mouth once daily) 30 tablet 0  . Multiple Vitamins-Minerals (CENTRUM SILVER 50+WOMEN PO) Take 1 tablet by mouth daily.    Marland Kitchen  nitroGLYCERIN (NITROSTAT) 0.4 MG SL tablet Place 1 tablet (0.4 mg total) under the tongue every 5 (five) minutes as needed for chest pain. 25 tablet 3  . NON FORMULARY Bipap qhs with IPAP of 20 and EPAP of 5. Tidal volume of 600 and respiratory rate of 16. Twice a day    . omeprazole (PRILOSEC) 20 MG capsule Take 1 capsule (20 mg total) by mouth 2 (two) times daily before a meal. 60 capsule 11  . OXYGEN Inhale 2.5 L into the lungs continuous.    . potassium chloride (K-DUR,KLOR-CON) 10 MEQ tablet Take 10 mEq by mouth 2 (two) times daily.    . vitamin  B-12 (CYANOCOBALAMIN) 1000 MCG tablet Take 1,000 mcg by mouth daily.     No current facility-administered medications for this visit.    Allergies:  Codeine and Heparin   Social History: The patient  reports that she quit smoking about 18 years ago. Her smoking use included Cigarettes. She has a 80.00 pack-year smoking history. She has never used smokeless tobacco. She reports that she does not drink alcohol or use drugs.   ROS:  Please see the history of present illness. Otherwise, complete review of systems is positive for chronic dyspnea on exertion, continues on oxygen supplementation. All other systems are reviewed and negative.   Physical Exam: VS:  BP (!) 110/58   Pulse 83   Ht _0  (1.626 m)   Wt 147 lb (66.7 kg)   SpO2 98%   BMI 25.23 kg/m , BMI Body mass index is 25.23 kg/m.  Wt Readings from Last 3 Encounters:  10/31/16 147 lb (66.7 kg)  09/25/16 142 lb (64.4 kg)  06/27/16 145 lb 12.8 oz (66.1 kg)    Chronically ill-appearing elderly woman in no active distress, wearing oxygen via nasal cannula. HEENT: Conjunctiva and lids normal, oropharynx clear. Neck: Supple, no elevated JVP or carotid bruits, no thyromegaly. Lungs: Decreased breath sounds without wheezes, nonlabored breathing at rest. Thorax: Well-healed device pocket site left upper chest. Cardiac: Irregular, distant, no S3 or significant systolic murmur, no pericardial rub. Abdomen: Soft, nontender, bowel sounds present. Extremities: No pitting edema, distal pulses 2+. Skin: Warm and dry. Musculoskeletal: No kyphosis. Neuropsychiatric: Alert and oriented x3, affect grossly appropriate.  ECG: I personally reviewed the tracing from 06/27/2016 which showed an atrial paced rhythm with right bundle branch block, possible inferior infarct pattern.  Recent Labwork: 03/10/2016: B Natriuretic Peptide 393.0 03/14/2016: Magnesium 1.9 06/12/2016: Pro B Natriuretic peptide (BNP) 358.0; TSH 2.65 06/21/2016: ALT 15; AST  15 06/26/2016: BUN 12; Creatinine, Ser 0.82; Hemoglobin 11.1; Platelets 150; Potassium 3.7; Sodium 141   Other Studies Reviewed Today:  Echocardiogram 03/10/2016: Study Conclusions  - Left ventricle: The cavity size was normal. Wall thickness was   increased in a pattern of mild LVH. Systolic function was mildly   to moderately reduced. The estimated ejection fraction was in the   range of 40% to 45%. Diffuse hypokinesis. There is akinesis of   the basalinferior myocardium. The study is not technically   sufficient to allow evaluation of LV diastolic function. - Ventricular septum: Septal motion showed abnormal function and   dyssynergy. The contour showed diastolic flattening. - Aortic valve: Trileaflet; mildly calcified leaflets. Left   coronary cusp mobility was restricted. There was trivial   regurgitation. - Mitral valve: Calcified annulus. There was mild to moderate   regurgitation. - Left atrium: The atrium was moderately dilated. - Right ventricle: The cavity size was severely dilated. Systolic  function was severely reduced. - Right atrium: The atrium was moderately dilated. Central venous   pressure (est): 8 mm Hg. - Tricuspid valve: There was mild regurgitation. - Pulmonary arteries: PA peak pressure: 30 mm Hg (S). - Pericardium, extracardiac: A small pericardial effusion was   identified circumferential to the heart. There was a left pleural   effusion.  Impressions:  - Mild LVH with LVEF 40-45%, diffuse hypokinesis with akinesis of   the basal inferior wall. Indeterminate diastolic function in the   setting of atrial fibrillation. Septal dyssynergy suggesting IVCD   and diastolic septal flattening also noted. Moderate left atrial   enlargement. MAC with mild to moderate mitral regurgitation.   Sclerotic aortic valve with trivial aortic regurgitation.   Severely dilated right ventricle with severely reduced   contraction. Moderate right atrial enlargement. Mild  tricuspid   regurgitation with PASP 30 mmHg. Left pleural effusion noted as   well as small pericardial effusion.  Assessment and Plan:  1. Paroxysmal atrial fibrillation. Plan to continue Eliquis and Zebeta.  2. Sinus node dysfunction with history of symptomatic bradycardia now status post Medtronic pacemaker placement. She is following in the device clinic with Dr. Lovena Le and will start home monitoring in February.  3. Cardiomyopathy, LVEF 40-45% by echocardiogram in May 2017. Will likely consider a follow-up echocardiogram around the time of her next visit.  4. Essential hypertension, blood pressure is well controlled today.  Current medicines were reviewed with the patient today.  Disposition: Follow-up in 4 months.  Signed, Satira Sark, MD, [facility] 10/31/2016 1:23 PM    [facility] at [facility] [address], [place], [place] [address] Phone: [phone]; Fax: [phone]

## 2016-10-31 ENCOUNTER — Ambulatory Visit (INDEPENDENT_AMBULATORY_CARE_PROVIDER_SITE_OTHER): Payer: Medicare Other | Admitting: Cardiology

## 2016-10-31 ENCOUNTER — Encounter: Payer: Self-pay | Admitting: Cardiology

## 2016-10-31 VITALS — BP 110/58 | HR 83 | Ht 64.0 in | Wt 147.0 lb

## 2016-10-31 DIAGNOSIS — I480 Paroxysmal atrial fibrillation: Secondary | ICD-10-CM | POA: Diagnosis not present

## 2016-10-31 DIAGNOSIS — I495 Sick sinus syndrome: Secondary | ICD-10-CM | POA: Diagnosis not present

## 2016-10-31 DIAGNOSIS — I10 Essential (primary) hypertension: Secondary | ICD-10-CM

## 2016-10-31 DIAGNOSIS — I429 Cardiomyopathy, unspecified: Secondary | ICD-10-CM

## 2016-10-31 NOTE — Patient Instructions (Signed)
Your physician wants you to follow-up in:  4 months Dr McDowell You will receive a reminder letter in the mail two months in advance. If you don't receive a letter, please call our office to schedule the follow-up appointment.    Your physician recommends that you continue on your current medications as directed. Please refer to the Current Medication list given to you today.      Thank you for choosing [facility] !        

## 2016-12-01 ENCOUNTER — Encounter: Payer: Self-pay | Admitting: Family

## 2016-12-05 DIAGNOSIS — E119 Type 2 diabetes mellitus without complications: Secondary | ICD-10-CM | POA: Diagnosis not present

## 2016-12-05 DIAGNOSIS — E782 Mixed hyperlipidemia: Secondary | ICD-10-CM | POA: Diagnosis not present

## 2016-12-05 DIAGNOSIS — I10 Essential (primary) hypertension: Secondary | ICD-10-CM | POA: Diagnosis not present

## 2016-12-09 ENCOUNTER — Encounter: Payer: Self-pay | Admitting: Family

## 2016-12-09 ENCOUNTER — Ambulatory Visit (HOSPITAL_COMMUNITY)
Admission: RE | Admit: 2016-12-09 | Discharge: 2016-12-09 | Disposition: A | Discharge status: Home / Self Care | Payer: Medicare Other | Source: Ambulatory Visit | Attending: Family | Admitting: Family

## 2016-12-09 ENCOUNTER — Ambulatory Visit (INDEPENDENT_AMBULATORY_CARE_PROVIDER_SITE_OTHER): Payer: Medicare Other | Admitting: Family

## 2016-12-09 VITALS — BP 100/58 | HR 74 | Temp 97.2°F | Ht 64.0 in | Wt 146.0 lb

## 2016-12-09 DIAGNOSIS — I6523 Occlusion and stenosis of bilateral carotid arteries: Secondary | ICD-10-CM | POA: Diagnosis not present

## 2016-12-09 NOTE — Progress Notes (Signed)
Chief Complaint: Follow up Extracranial Carotid Artery Stenosis   History of Present Illness  [name] is a 83 y.o. female patient of Dr. Arbie CookeyEarly whom he first evaluated in February 2016 regarding a recent carotid duplex at an outside facility. She denies any prior history of amaurosis fugax, transient ischemic attack or stroke. The carotid duplex suggested a possible moderate to severe left carotid stenosis. She does have a history of coronary artery disease with myocardial infarction in 1999. She was a cigarette smoker up until that time and quit smoking in 1999. She does have severe COPD and has continuous oxygen therapy. She walks with a rolling walker. No history of peripheral vascular occlusive disease.  She is performing water aerobics in her bathtub.She also performs leg exercises. Her walking is limited by her dyspnea.   She had a pacemaker inserted in September 2017 for atrial fib alternating with bradycardia.   She has intentionally lost 40+ pounds since July 2016  Pt Diabetic: yes, pt states her last A1C was 5.? Pt smoker: former smoker, quit in 1999 when she had her MI  Pt meds include: Statin : no ASA: no Other anticoagulants/antiplatelets: Eliquis started about September 2017 for atrial fib    Past Medical History:  Diagnosis Date  . Asthma   . COPD (chronic obstructive pulmonary disease) (HCC)    Oxygen dependent  . Coronary atherosclerosis of native coronary artery   . Essential hypertension, benign   . History of GI bleed    ulcer  . Hyperlipidemia   . Old inferior wall myocardial infarction 1999   [facility]  . PAF (paroxysmal atrial fibrillation) (HCC)   . Sinus node dysfunction (HCC)    Medtronic PPM  . Type 2 diabetes mellitus (HCC)     Social History Social History  Substance Use Topics  . Smoking status: Former Smoker    Packs/day: 2.00    Years: 40.00    Types: Cigarettes    Quit date: 01/13/1998  . Smokeless tobacco: Never Used  .  Alcohol use No    Family History Family History  Problem Relation Age of Onset  . Diabetes Mother   . Diabetes Father   . Heart disease Father   . Cancer Sister     unknown  . Cancer Brother     unknown  . Diabetes Brother   . Colon cancer Neg Hx     Surgical History Past Surgical History:  Procedure Laterality Date  . Cesareen section    . CHOLECYSTECTOMY    . COLONOSCOPY  09/2004   RMR: For obscure GI bleed. Left-sided diverticula, otherwise normal ileocolonoscopy  . Coronary artery stent  1999   BMS x 3 RCA  ([facility]), PTCRA RC4  . EP IMPLANTABLE DEVICE N/A 06/26/2016   Procedure: Pacemaker Implant;  Surgeon: Will Jorja LoaMartin Camnitz, MD;  Location: [facility];  Service: Cardiovascular;  Laterality: N/A;  . ESOPHAGOGASTRODUODENOSCOPY  11 2005   RMR: For melena, normal exam.  . EXPLORATORY LAPAROTOMY    . GIVENS CAPSULE STUDY  08/2004   official report unavailable, but reported couple of AVMs, nonbleeding  . PARTIAL COLECTOMY  1977   blockage/gangrene? unclear if small bowel or colon  . THORACENTESIS Left 03/10/16   1 L transudative fluid    Allergies  Allergen Reactions  . Codeine Nausea Only  . Heparin Other (See Comments)    Causes internal bleeding     Current Outpatient Prescriptions  Medication Sig Dispense Refill  . apixaban (  ELIQUIS) 5 MG TABS tablet Take 1 tablet (5 mg total) by mouth 2 (two) times daily. 60 tablet 0  . bisoprolol (ZEBETA) 5 MG tablet Take 1 tablet (5 mg total) by mouth daily. Pt needs appointment for further refills. 90 tablet 0  . escitalopram (LEXAPRO) 20 MG tablet Take 10 mg by mouth daily.     . furosemide (LASIX) 40 MG tablet Take 40 mg by mouth daily. Daily at 0800 am    . ipratropium-albuterol (DUONEB) 0.5-2.5 (3) MG/3ML SOLN Take 3 mLs by nebulization 3 (three) times daily. (Patient taking differently: Take 3 mLs by nebulization every 6 (six) hours as needed (for shortness of breath). ) 360 mL   . LORazepam (ATIVAN) 0.5 MG tablet  Take 1 tablet (0.5 mg total) by mouth daily. Take one tablet by mouth once daily (Patient taking differently: Take 0.5 mg by mouth at bedtime. Take one tablet by mouth once daily) 30 tablet 0  . Multiple Vitamins-Minerals (CENTRUM SILVER 50+WOMEN PO) Take 1 tablet by mouth daily.    . nitroGLYCERIN (NITROSTAT) 0.4 MG SL tablet Place 1 tablet (0.4 mg total) under the tongue every 5 (five) minutes as needed for chest pain. 25 tablet 3  . NON FORMULARY Bipap qhs with IPAP of 20 and EPAP of 5. Tidal volume of 600 and respiratory rate of 16. Twice a day    . omeprazole (PRILOSEC) 20 MG capsule Take 1 capsule (20 mg total) by mouth 2 (two) times daily before a meal. 60 capsule 11  . OXYGEN Inhale 2.5 L into the lungs continuous.    . potassium chloride (K-DUR,KLOR-CON) 10 MEQ tablet Take 10 mEq by mouth 2 (two) times daily.    . vitamin B-12 (CYANOCOBALAMIN) 1000 MCG tablet Take 1,000 mcg by mouth daily.     No current facility-administered medications for this visit.     Review of Systems : See HPI for pertinent positives and negatives.  Physical Examination  Vitals:   12/09/16 1449 12/09/16 1452  BP: 107/62 (!) 100/58  Pulse: 74   Temp: 97.2 F (36.2 C)   TempSrc: Oral   SpO2: 100%   Weight: 146 lb (66.2 kg)   Height: 5\' 4"  (1.626 m)    Body mass index is 25.06 kg/m.  General: WDWN elderly female in NAD GAIT: slow, deliberate Eyes: PERRLA Pulmonary:  Respirations are non-labored, CTAB, distant sounds in right posterior fields. Using supplemental oxygen by nasal canula from a tank. Cardiac: regular rhythm, no detected murmur. Pacemaker palpated left upper chest.  VASCULAR EXAM Carotid Bruits Right Left   Negative Negative    Aorta is not palpable. Radial pulses are 2+ palpable and equal.                                                                                                                                          LE Pulses Right Left  POPLITEAL  not palpable    not palpable       POSTERIOR TIBIAL  not palpable   not palpable        DORSALIS PEDIS      ANTERIOR TIBIAL faintly palpable  faintly palpable     Gastrointestinal: soft, nontender, BS WNL, no r/g,  no palpable masses.  Musculoskeletal: no muscle atrophy/wasting. M/S 5/5 throughout, extremities without ischemic changes.  Neurologic: A&O X 3; Appropriate Affect, Speech is normal CN 2-12 intact except is hard of hearing, pain and light touch intact in extremities, Motor exam as listed above.       Assessment: [name] is a 83 y.o. female who has no history of stroke or TIA. Carotid duplex from another facility in February 2016 suggested a possible moderate to severe left carotid stenosis. The last three annual carotid duplex suggested <40% bilateral ICA stenosis.   Fortunately her DM is in excellent control and she quit smoking in 1999 when she had an MI.  She has intentionally lost 40+ pounds.   DATA Today's carotid duplex suggests <40% bilateral ICA stenosis. Bilateral vertebral artery flow is antegrade.  Bilateral subclavian artery waveforms are normal.  No significant change since prior exams on 12-04-15 and 11-28-14.   Plan: Follow-up in 3 years with Carotid Duplex scan.    I discussed in depth with the patient the nature of atherosclerosis, and emphasized the importance of maximal medical management including strict control of blood pressure, blood glucose, and lipid levels, obtaining regular exercise, and continued cessation of smoking.  The patient is aware that without maximal medical management the underlying atherosclerotic disease process will progress, limiting the benefit of any interventions. The patient was given information about stroke prevention and what symptoms should prompt the patient to seek immediate medical care. Thank you for allowing [place] to participate in this patient's care.  Charisse March, RN, MSN, FNP-C [facility] Office: [phone]  Clinic Physician: Early  12/09/16 2:58 PM

## 2016-12-09 NOTE — Patient Instructions (Signed)
Stroke Prevention Some medical conditions and behaviors are associated with an increased chance of having a stroke. You may prevent a stroke by making healthy choices and managing medical conditions. How can I reduce my risk of having a stroke?  Stay physically active. Get at least 30 minutes of activity on most or all days.  Do not smoke. It may also be helpful to avoid exposure to secondhand smoke.  Limit alcohol use. Moderate alcohol use is considered to be:  No more than 2 drinks per day for men.  No more than 1 drink per day for nonpregnant women.  Eat healthy foods. This involves:  Eating 5 or more servings of fruits and vegetables a day.  Making dietary changes that address high blood pressure (hypertension), high cholesterol, diabetes, or obesity.  Manage your cholesterol levels.  Making food choices that are high in fiber and low in saturated fat, trans fat, and cholesterol may control cholesterol levels.  Take any prescribed medicines to control cholesterol as directed by your health care provider.  Manage your diabetes.  Controlling your carbohydrate and sugar intake is recommended to manage diabetes.  Take any prescribed medicines to control diabetes as directed by your health care provider.  Control your hypertension.  Making food choices that are low in salt (sodium), saturated fat, trans fat, and cholesterol is recommended to manage hypertension.  Ask your health care provider if you need treatment to lower your blood pressure. Take any prescribed medicines to control hypertension as directed by your health care provider.  If you are 18-39 years of age, have your blood pressure checked every 3-5 years. If you are 40 years of age or older, have your blood pressure checked every year.  Maintain a healthy weight.  Reducing calorie intake and making food choices that are low in sodium, saturated fat, trans fat, and cholesterol are recommended to manage  weight.  Stop drug abuse.  Avoid taking birth control pills.  Talk to your health care provider about the risks of taking birth control pills if you are over 35 years old, smoke, get migraines, or have ever had a blood clot.  Get evaluated for sleep disorders (sleep apnea).  Talk to your health care provider about getting a sleep evaluation if you snore a lot or have excessive sleepiness.  Take medicines only as directed by your health care provider.  For some people, aspirin or blood thinners (anticoagulants) are helpful in reducing the risk of forming abnormal blood clots that can lead to stroke. If you have the irregular heart rhythm of atrial fibrillation, you should be on a blood thinner unless there is a good reason you cannot take them.  Understand all your medicine instructions.  Make sure that other conditions (such as anemia or atherosclerosis) are addressed. Get help right away if:  You have sudden weakness or numbness of the face, arm, or leg, especially on one side of the body.  Your face or eyelid droops to one side.  You have sudden confusion.  You have trouble speaking (aphasia) or understanding.  You have sudden trouble seeing in one or both eyes.  You have sudden trouble walking.  You have dizziness.  You have a loss of balance or coordination.  You have a sudden, severe headache with no known cause.  You have new chest pain or an irregular heartbeat. Any of these symptoms may represent a serious problem that is an emergency. Do not wait to see if the symptoms will go away.   Get medical help at once. Call your local emergency services (911 in [place] Do not drive yourself to the hospital. This information is not intended to replace advice given to you by your health care provider. Make sure you discuss any questions you have with your health care provider. Document Released: 11/13/2004 Document Revised: 03/13/2016 Document Reviewed: 04/08/2013 Elsevier  Interactive Patient Education  2017 Elsevier Inc.  

## 2016-12-10 DIAGNOSIS — I480 Paroxysmal atrial fibrillation: Secondary | ICD-10-CM | POA: Diagnosis not present

## 2016-12-10 DIAGNOSIS — Z0000 Encounter for general adult medical examination without abnormal findings: Secondary | ICD-10-CM | POA: Diagnosis not present

## 2016-12-10 DIAGNOSIS — E782 Mixed hyperlipidemia: Secondary | ICD-10-CM | POA: Diagnosis not present

## 2016-12-10 DIAGNOSIS — E119 Type 2 diabetes mellitus without complications: Secondary | ICD-10-CM | POA: Diagnosis not present

## 2016-12-10 DIAGNOSIS — I5032 Chronic diastolic (congestive) heart failure: Secondary | ICD-10-CM | POA: Diagnosis not present

## 2016-12-10 DIAGNOSIS — J449 Chronic obstructive pulmonary disease, unspecified: Secondary | ICD-10-CM | POA: Diagnosis not present

## 2016-12-10 DIAGNOSIS — I10 Essential (primary) hypertension: Secondary | ICD-10-CM | POA: Diagnosis not present

## 2016-12-12 NOTE — Addendum Note (Signed)
Addended by: Burton ApleyPETTY, Lyla Jasek A on: 12/12/2016 01:20 PM   Modules accepted: Orders

## 2016-12-25 ENCOUNTER — Encounter: Payer: Medicare Other | Admitting: *Deleted

## 2016-12-25 ENCOUNTER — Telehealth: Payer: Self-pay | Admitting: Cardiology

## 2016-12-25 NOTE — Telephone Encounter (Signed)
LMOVM reminding pt to send remote transmission.   

## 2016-12-26 ENCOUNTER — Encounter: Payer: Self-pay | Admitting: Cardiology

## 2017-01-09 ENCOUNTER — Ambulatory Visit (INDEPENDENT_AMBULATORY_CARE_PROVIDER_SITE_OTHER): Payer: Medicare Other | Admitting: *Deleted

## 2017-01-09 DIAGNOSIS — I495 Sick sinus syndrome: Secondary | ICD-10-CM

## 2017-01-09 NOTE — Progress Notes (Signed)
Remote pacemaker transmission.   

## 2017-01-12 LAB — CUP PACEART REMOTE DEVICE CHECK
Battery Remaining Longevity: 102 mo
Battery Voltage: 3.02 V
Brady Statistic AS VP Percent: 0 %
Brady Statistic AS VS Percent: 0.55 %
Brady Statistic RA Percent Paced: 99.44 %
Implantable Lead Implant Date: 20170907
Implantable Lead Implant Date: 20170907
Implantable Lead Location: 753860
Implantable Lead Model: 5076
Implantable Pulse Generator Implant Date: 20170907
Lead Channel Impedance Value: 323 Ohm
Lead Channel Pacing Threshold Amplitude: 0.75 V
Lead Channel Pacing Threshold Pulse Width: 0.4 ms
Lead Channel Pacing Threshold Pulse Width: 0.4 ms
Lead Channel Sensing Intrinsic Amplitude: 2.5 mV
Lead Channel Sensing Intrinsic Amplitude: 2.5 mV
Lead Channel Setting Pacing Amplitude: 2 V
MDC IDC LEAD LOCATION: 753859
MDC IDC MSMT LEADCHNL RA IMPEDANCE VALUE: 437 Ohm
MDC IDC MSMT LEADCHNL RV IMPEDANCE VALUE: 456 Ohm
MDC IDC MSMT LEADCHNL RV IMPEDANCE VALUE: 589 Ohm
MDC IDC MSMT LEADCHNL RV PACING THRESHOLD AMPLITUDE: 0.5 V
MDC IDC MSMT LEADCHNL RV SENSING INTR AMPL: 4.125 mV
MDC IDC MSMT LEADCHNL RV SENSING INTR AMPL: 4.125 mV
MDC IDC SESS DTM: 20180323173348
MDC IDC SET LEADCHNL RV PACING AMPLITUDE: 2.5 V
MDC IDC SET LEADCHNL RV PACING PULSEWIDTH: 0.4 ms
MDC IDC SET LEADCHNL RV SENSING SENSITIVITY: 0.45 mV
MDC IDC STAT BRADY AP VP PERCENT: 0.06 %
MDC IDC STAT BRADY AP VS PERCENT: 99.39 %
MDC IDC STAT BRADY RV PERCENT PACED: 0.08 %

## 2017-01-15 ENCOUNTER — Encounter: Payer: Self-pay | Admitting: Cardiology

## 2017-03-19 ENCOUNTER — Encounter: Payer: Self-pay | Admitting: Cardiology

## 2017-03-19 NOTE — Progress Notes (Signed)
Cardiology Office Note  Date: 03/20/2017   ID: [name], DOB 1931/06/20, MRN [id number]  PCP: Celene Squibb, MD  Primary Cardiologist: Rozann Lesches, MD   Chief Complaint  Patient presents with  . PAF    History of Present Illness: [name] is an 83 y.o. female last seen in January. She is here today with her daughter for a follow-up visit. States that she feels well in terms of overall energy, NYHA class II dyspnea with typical activities, no palpitations or chest pain. She has had no syncope.  She follows in the device clinic with Dr. Lovena Le with history of Medtronic pacemaker. Device being followed remotely as well.  I reviewed her medications which are outlined below. She continues on Eliquis for stroke prophylaxis, reports no bleeding problems. Also on Zebeta, Lasix, and potassium supplements.  We discussed obtaining a follow-up echocardiogram.  Past Medical History:  Diagnosis Date  . Asthma   . COPD (chronic obstructive pulmonary disease) ([place])    Oxygen dependent  . Coronary atherosclerosis of native coronary artery   . Essential hypertension, benign   . History of GI bleed    Ulcer  . Hyperlipidemia   . Old inferior wall myocardial infarction 1999   [facility]  . PAF (paroxysmal atrial fibrillation) ([place])   . Sinus node dysfunction (HCC)    Medtronic PPM  . Type 2 diabetes mellitus ([place])     Past Surgical History:  Procedure Laterality Date  . Cesareen section    . CHOLECYSTECTOMY    . COLONOSCOPY  09/2004   RMR: For obscure GI bleed. Left-sided diverticula, otherwise normal ileocolonoscopy  . Coronary artery stent  1999   BMS x 3 RCA  ([facility]), PTCRA RC4  . EP IMPLANTABLE DEVICE N/A 06/26/2016   Procedure: Pacemaker Implant;  Surgeon: Will Meredith Leeds, MD;  Location: [place] [facility];  Service: Cardiovascular;  Laterality: N/A;  . ESOPHAGOGASTRODUODENOSCOPY  11 2005   RMR: For melena, normal exam.  . EXPLORATORY LAPAROTOMY    . GIVENS  CAPSULE STUDY  08/2004   official report unavailable, but reported couple of AVMs, nonbleeding  . PARTIAL COLECTOMY  1977   blockage/gangrene? unclear if small bowel or colon  . THORACENTESIS Left 03/10/16   1 L transudative fluid    Current Outpatient Prescriptions  Medication Sig Dispense Refill  . apixaban (ELIQUIS) 5 MG TABS tablet Take 1 tablet (5 mg total) by mouth 2 (two) times daily. 60 tablet 0  . bisoprolol (ZEBETA) 5 MG tablet Take 1 tablet (5 mg total) by mouth daily. Pt needs appointment for further refills. 90 tablet 0  . escitalopram (LEXAPRO) 20 MG tablet Take 10 mg by mouth daily.     . furosemide (LASIX) 40 MG tablet Take 40 mg by mouth daily. Daily at 0800 am    . ipratropium-albuterol (DUONEB) 0.5-2.5 (3) MG/3ML SOLN Take 3 mLs by nebulization 3 (three) times daily. (Patient taking differently: Take 3 mLs by nebulization every 6 (six) hours as needed (for shortness of breath). ) 360 mL   . LORazepam (ATIVAN) 0.5 MG tablet Take 1 tablet (0.5 mg total) by mouth daily. Take one tablet by mouth once daily (Patient taking differently: Take 0.5 mg by mouth at bedtime. Take one tablet by mouth once daily) 30 tablet 0  . Multiple Vitamins-Minerals (CENTRUM SILVER 50+WOMEN PO) Take 1 tablet by mouth daily.    . nitroGLYCERIN (NITROSTAT) 0.4 MG SL tablet Place 1 tablet (0.4 mg total) under  the tongue every 5 (five) minutes as needed for chest pain. 25 tablet 3  . omeprazole (PRILOSEC) 20 MG capsule Take 1 capsule (20 mg total) by mouth 2 (two) times daily before a meal. 60 capsule 11  . OXYGEN Inhale 2.5 L into the lungs continuous.    . potassium chloride (K-DUR,KLOR-CON) 10 MEQ tablet Take 10 mEq by mouth 2 (two) times daily.    . vitamin B-12 (CYANOCOBALAMIN) 1000 MCG tablet Take 1,000 mcg by mouth daily.     No current facility-administered medications for this visit.    Allergies:  Codeine and Heparin   Social History: The patient  reports that she quit smoking about 19 years  ago. Her smoking use included Cigarettes. She has a 80.00 pack-year smoking history. She has never used smokeless tobacco. She reports that she does not drink alcohol or use drugs.   ROS:  Please see the history of present illness. Otherwise, complete review of systems is positive for hearing loss, arthritic pain.  All other systems are reviewed and negative.   Physical Exam: VS:  BP (!) 142/74   Pulse 76   Ht _0  (1.626 m)   Wt 145 lb (65.8 kg)   SpO2 99%   BMI 24.89 kg/m , BMI Body mass index is 24.89 kg/m.  Wt Readings from Last 3 Encounters:  03/20/17 145 lb (65.8 kg)  12/09/16 146 lb (66.2 kg)  10/31/16 147 lb (66.7 kg)    Elderly woman in no active distress, wearing oxygen via nasal cannula. HEENT: Conjunctiva and lids normal, oropharynx clear. Neck: Supple, no elevated JVP or carotid bruits, no thyromegaly. Lungs: Decreased breath sounds without wheezes, nonlabored breathing at rest. Cardiac: RRR, distant, no S3 or significant systolic murmur, no pericardial rub. Abdomen: Soft, nontender, bowel sounds present. Extremities: No pitting edema, distal pulses 2+. Skin: Warm and dry. Musculoskeletal: No kyphosis. Neuropsychiatric: Alert and oriented x3, affect grossly appropriate.  ECG: I personally reviewed the tracing from 06/27/2016 which showed an atrial paced rhythm with right bundle branch block, possible inferior infarct pattern.  Recent Labwork: 06/12/2016: Pro B Natriuretic peptide (BNP) 358.0; TSH 2.65 06/21/2016: ALT 15; AST 15 06/26/2016: BUN 12; Creatinine, Ser 0.82; Hemoglobin 11.1; Platelets 150; Potassium 3.7; Sodium 141   Other Studies Reviewed Today:  Echocardiogram 03/10/2016: Study Conclusions  - Left ventricle: The cavity size was normal. Wall thickness was increased in a pattern of mild LVH. Systolic function was mildly to moderately reduced. The estimated ejection fraction was in the range of 40% to 45%. Diffuse hypokinesis. There is akinesis  of the basalinferior myocardium. The study is not technically sufficient to allow evaluation of LV diastolic function. - Ventricular septum: Septal motion showed abnormal function and dyssynergy. The contour showed diastolic flattening. - Aortic valve: Trileaflet; mildly calcified leaflets. Left coronary cusp mobility was restricted. There was trivial regurgitation. - Mitral valve: Calcified annulus. There was mild to moderate regurgitation. - Left atrium: The atrium was moderately dilated. - Right ventricle: The cavity size was severely dilated. Systolic function was severely reduced. - Right atrium: The atrium was moderately dilated. Central venous pressure (est): 8 mm Hg. - Tricuspid valve: There was mild regurgitation. - Pulmonary arteries: PA peak pressure: 30 mm Hg (S). - Pericardium, extracardiac: A small pericardial effusion was identified circumferential to the heart. There was a left pleural effusion.  Impressions:  - Mild LVH with LVEF 40-45%, diffuse hypokinesis with akinesis of the basal inferior wall. Indeterminate diastolic function in the setting of atrial fibrillation.  Septal dyssynergy suggesting IVCD and diastolic septal flattening also noted. Moderate left atrial enlargement. MAC with mild to moderate mitral regurgitation. Sclerotic aortic valve with trivial aortic regurgitation. Severely dilated right ventricle with severely reduced contraction. Moderate right atrial enlargement. Mild tricuspid regurgitation with PASP 30 mmHg. Left pleural effusion noted as well as small pericardial effusion.  Assessment and Plan:  1. Paroxysmal atrial fibrillation. She reports no palpitations. Continue Eliquis for stroke prophylaxis. Requesting most recent lab work from Dr. [place] Crane. She reports no bleeding problems.  2. Secondary cardiomyopathy, LVEF 40-45% by echocardiogram last year. Plan to follow-up with an echocardiogram at this  point on medical therapy. She has been clinically stable.  3. Essential hypertension, no changes made present medications. Keep follow-up with Dr. [place] Crane.  4. Sinus node dysfunction and symptomatic bradycardia status post Medtronic pacemaker placement. She follows with Dr. Lovena Le.  Current medicines were reviewed with the patient today.   Orders Placed This Encounter  Procedures  . ECHOCARDIOGRAM COMPLETE    Disposition: Follow-up in 6 months.  Signed, Satira Sark, MD, [facility] 03/20/2017 1:06 PM    [facility] at [facility] [address] [place], [place] [address] Phone: [phone]; Fax: [phone]

## 2017-03-20 ENCOUNTER — Ambulatory Visit (INDEPENDENT_AMBULATORY_CARE_PROVIDER_SITE_OTHER): Payer: Medicare Other | Admitting: Cardiology

## 2017-03-20 ENCOUNTER — Encounter: Payer: Self-pay | Admitting: Cardiology

## 2017-03-20 VITALS — BP 142/74 | HR 76 | Ht 64.0 in | Wt 145.0 lb

## 2017-03-20 DIAGNOSIS — I495 Sick sinus syndrome: Secondary | ICD-10-CM

## 2017-03-20 DIAGNOSIS — I10 Essential (primary) hypertension: Secondary | ICD-10-CM

## 2017-03-20 DIAGNOSIS — Z950 Presence of cardiac pacemaker: Secondary | ICD-10-CM

## 2017-03-20 DIAGNOSIS — I429 Cardiomyopathy, unspecified: Secondary | ICD-10-CM

## 2017-03-20 DIAGNOSIS — I6523 Occlusion and stenosis of bilateral carotid arteries: Secondary | ICD-10-CM

## 2017-03-20 DIAGNOSIS — I480 Paroxysmal atrial fibrillation: Secondary | ICD-10-CM

## 2017-03-20 NOTE — Patient Instructions (Addendum)
Your physician wants you to follow-up in: 6 months with Dr McDowell You will receive a reminder letter in the mail two months in advance. If you don't receive a letter, please call our office to schedule the follow-up appointment.   Your physician recommends that you continue on your current medications as directed. Please refer to the Current Medication list given to you today.   If you need a refill on your cardiac medications before your next appointment, please call your pharmacy.    Your physician has requested that you have an echocardiogram. Echocardiography is a painless test that uses sound waves to create images of your heart. It provides your doctor with information about the size and shape of your heart and how well your heart's chambers and valves are working. This procedure takes approximately one hour. There are no restrictions for this procedure.     Thank you for choosing [facility] !        

## 2017-03-26 ENCOUNTER — Ambulatory Visit (HOSPITAL_COMMUNITY)
Admission: RE | Admit: 2017-03-26 | Discharge: 2017-03-26 | Disposition: A | Discharge status: Home / Self Care | Payer: Medicare Other | Source: Ambulatory Visit | Attending: Cardiology | Admitting: Cardiology

## 2017-03-26 DIAGNOSIS — I080 Rheumatic disorders of both mitral and aortic valves: Secondary | ICD-10-CM | POA: Insufficient documentation

## 2017-03-26 DIAGNOSIS — E785 Hyperlipidemia, unspecified: Secondary | ICD-10-CM | POA: Insufficient documentation

## 2017-03-26 DIAGNOSIS — E119 Type 2 diabetes mellitus without complications: Secondary | ICD-10-CM | POA: Insufficient documentation

## 2017-03-26 DIAGNOSIS — I10 Essential (primary) hypertension: Secondary | ICD-10-CM | POA: Diagnosis not present

## 2017-03-26 DIAGNOSIS — J449 Chronic obstructive pulmonary disease, unspecified: Secondary | ICD-10-CM | POA: Insufficient documentation

## 2017-03-26 DIAGNOSIS — I4891 Unspecified atrial fibrillation: Secondary | ICD-10-CM | POA: Diagnosis not present

## 2017-03-26 DIAGNOSIS — I429 Cardiomyopathy, unspecified: Secondary | ICD-10-CM | POA: Insufficient documentation

## 2017-03-26 NOTE — Progress Notes (Signed)
*  PRELIMINARY RESULTS* Echocardiogram 2D Echocardiogram has been performed.  [name], [name] 03/26/2017, 12:18 PM

## 2017-04-13 ENCOUNTER — Telehealth: Payer: Self-pay | Admitting: Cardiology

## 2017-04-13 ENCOUNTER — Encounter: Payer: Medicare Other | Admitting: *Deleted

## 2017-04-13 NOTE — Telephone Encounter (Signed)
Spoke with pt and reminded pt of remote transmission that is due today. Pt verbalized understanding.   

## 2017-04-13 NOTE — Telephone Encounter (Signed)
Patient friend called and stated that she tried to help pt send remote transmission and was unable to do so. Pt friend called Med[employer] tech support and was informed that the monitor could not find cell signal and that was after moving the monitor to another room and then outside. Presented the patient with two options. 1. Take monitor to a [employer] or [employer] and send remote transmission 2. Come to the office every 6 months to have device checked. Pt agreed to come into the the office every 6 months to have the device checked. Pt aware that she is due to see MD in September 2018 and to expect a letter in July to call and schedule an appointment.

## 2017-04-17 ENCOUNTER — Encounter: Payer: Self-pay | Admitting: Cardiology

## 2017-06-25 DIAGNOSIS — H52223 Regular astigmatism, bilateral: Secondary | ICD-10-CM | POA: Diagnosis not present

## 2017-06-25 DIAGNOSIS — H524 Presbyopia: Secondary | ICD-10-CM | POA: Diagnosis not present

## 2017-06-25 DIAGNOSIS — H5203 Hypermetropia, bilateral: Secondary | ICD-10-CM | POA: Diagnosis not present

## 2017-06-25 DIAGNOSIS — H3530 Unspecified macular degeneration: Secondary | ICD-10-CM | POA: Diagnosis not present

## 2017-07-08 DIAGNOSIS — I10 Essential (primary) hypertension: Secondary | ICD-10-CM | POA: Diagnosis not present

## 2017-07-08 DIAGNOSIS — E119 Type 2 diabetes mellitus without complications: Secondary | ICD-10-CM | POA: Diagnosis not present

## 2017-07-10 DIAGNOSIS — E119 Type 2 diabetes mellitus without complications: Secondary | ICD-10-CM | POA: Diagnosis not present

## 2017-07-10 DIAGNOSIS — I5032 Chronic diastolic (congestive) heart failure: Secondary | ICD-10-CM | POA: Diagnosis not present

## 2017-07-10 DIAGNOSIS — I10 Essential (primary) hypertension: Secondary | ICD-10-CM | POA: Diagnosis not present

## 2017-07-10 DIAGNOSIS — I480 Paroxysmal atrial fibrillation: Secondary | ICD-10-CM | POA: Diagnosis not present

## 2017-07-10 DIAGNOSIS — Z6823 Body mass index (BMI) 23.0-23.9, adult: Secondary | ICD-10-CM | POA: Diagnosis not present

## 2017-07-10 DIAGNOSIS — J449 Chronic obstructive pulmonary disease, unspecified: Secondary | ICD-10-CM | POA: Diagnosis not present

## 2017-07-10 DIAGNOSIS — Z23 Encounter for immunization: Secondary | ICD-10-CM | POA: Diagnosis not present

## 2017-07-10 DIAGNOSIS — E782 Mixed hyperlipidemia: Secondary | ICD-10-CM | POA: Diagnosis not present

## 2017-07-20 ENCOUNTER — Ambulatory Visit (INDEPENDENT_AMBULATORY_CARE_PROVIDER_SITE_OTHER): Payer: Medicare Other | Admitting: Internal Medicine

## 2017-07-20 ENCOUNTER — Encounter: Payer: Self-pay | Admitting: Internal Medicine

## 2017-07-20 VITALS — BP 124/74 | HR 87 | Ht 64.0 in | Wt 149.0 lb

## 2017-07-20 DIAGNOSIS — I480 Paroxysmal atrial fibrillation: Secondary | ICD-10-CM

## 2017-07-20 DIAGNOSIS — Z950 Presence of cardiac pacemaker: Secondary | ICD-10-CM

## 2017-07-20 DIAGNOSIS — I6523 Occlusion and stenosis of bilateral carotid arteries: Secondary | ICD-10-CM

## 2017-07-20 DIAGNOSIS — I5042 Chronic combined systolic (congestive) and diastolic (congestive) heart failure: Secondary | ICD-10-CM | POA: Diagnosis not present

## 2017-07-20 LAB — CUP PACEART INCLINIC DEVICE CHECK
Date Time Interrogation Session: 20181001142258
Implantable Lead Implant Date: 20170907
Implantable Lead Location: 753860
Implantable Lead Model: 5076
Implantable Pulse Generator Implant Date: 20170907
Lead Channel Pacing Threshold Amplitude: 0.75 V
Lead Channel Pacing Threshold Pulse Width: 0.4 ms
Lead Channel Pacing Threshold Pulse Width: 0.4 ms
Lead Channel Sensing Intrinsic Amplitude: 4.1 mV
Lead Channel Setting Pacing Amplitude: 2.5 V
Lead Channel Setting Pacing Pulse Width: 0.4 ms
MDC IDC LEAD IMPLANT DT: 20170907
MDC IDC LEAD LOCATION: 753859
MDC IDC MSMT LEADCHNL RA SENSING INTR AMPL: 2.8 mV
MDC IDC MSMT LEADCHNL RV PACING THRESHOLD AMPLITUDE: 0.5 V
MDC IDC SET LEADCHNL RA PACING AMPLITUDE: 2 V
MDC IDC SET LEADCHNL RV SENSING SENSITIVITY: 0.45 mV

## 2017-07-20 NOTE — Progress Notes (Signed)
HPI [name] returns today for followup. She is a very pleasant 83 year old woman with a history of paroxysmal atrial fibrillation, symptomatic sinus node dysfunction, status post pacemaker insertion. She also wears chronic home oxygen therapy secondary to COPD. She was a prior smoker. She denies chest pain or shortness of breath. No peripheral edema. No palpitations. No syncope. Allergies  Allergen Reactions  . Codeine Nausea Only  . Heparin Other (See Comments)    Causes internal bleeding      Current Outpatient Prescriptions  Medication Sig Dispense Refill  . apixaban (ELIQUIS) 5 MG TABS tablet Take 1 tablet (5 mg total) by mouth 2 (two) times daily. 60 tablet 0  . bisoprolol (ZEBETA) 5 MG tablet Take 1 tablet (5 mg total) by mouth daily. Pt needs appointment for further refills. 90 tablet 0  . escitalopram (LEXAPRO) 20 MG tablet Take 10 mg by mouth daily.     . furosemide (LASIX) 40 MG tablet Take 40 mg by mouth daily. Daily at 0800 am    . ipratropium-albuterol (DUONEB) 0.5-2.5 (3) MG/3ML SOLN Take 3 mLs by nebulization 3 (three) times daily. (Patient taking differently: Take 3 mLs by nebulization every 6 (six) hours as needed (for shortness of breath). ) 360 mL   . LORazepam (ATIVAN) 0.5 MG tablet Take 1 tablet (0.5 mg total) by mouth daily. Take one tablet by mouth once daily (Patient taking differently: Take 0.5 mg by mouth at bedtime. Take one tablet by mouth once daily) 30 tablet 0  . Multiple Vitamins-Minerals (CENTRUM SILVER 50+WOMEN PO) Take 1 tablet by mouth daily.    . nitroGLYCERIN (NITROSTAT) 0.4 MG SL tablet Place 1 tablet (0.4 mg total) under the tongue every 5 (five) minutes as needed for chest pain. 25 tablet 3  . omeprazole (PRILOSEC) 20 MG capsule Take 1 capsule (20 mg total) by mouth 2 (two) times daily before a meal. 60 capsule 11  . OXYGEN Inhale 2.5 L into the lungs continuous.    . potassium chloride (K-DUR,KLOR-CON) 10 MEQ tablet Take 10 mEq by mouth 2  (two) times daily.    . vitamin B-12 (CYANOCOBALAMIN) 1000 MCG tablet Take 1,000 mcg by mouth daily.     No current facility-administered medications for this visit.      Past Medical History:  Diagnosis Date  . Asthma   . COPD (chronic obstructive pulmonary disease) (HCC)    Oxygen dependent  . Coronary atherosclerosis of native coronary artery   . Essential hypertension, benign   . History of GI bleed    Ulcer  . Hyperlipidemia   . Old inferior wall myocardial infarction 1999   [facility]  . PAF (paroxysmal atrial fibrillation) (HCC)   . Sinus node dysfunction (HCC)    Medtronic PPM  . Type 2 diabetes mellitus (HCC)     ROS:   All systems reviewed and negative except as noted in the HPI.   Past Surgical History:  Procedure Laterality Date  . Cesareen section    . CHOLECYSTECTOMY    . COLONOSCOPY  09/2004   RMR: For obscure GI bleed. Left-sided diverticula, otherwise normal ileocolonoscopy  . Coronary artery stent  1999   BMS x 3 RCA  ([facility]), PTCRA RC4  . EP IMPLANTABLE DEVICE N/A 06/26/2016   Procedure: Pacemaker Implant;  Surgeon: Will Jorja Loa, MD;  [facility];  Service: Cardiovascular;  Laterality: N/A;  . ESOPHAGOGASTRODUODENOSCOPY  11 2005   RMR: For melena, normal exam.  .  EXPLORATORY LAPAROTOMY    . GIVENS CAPSULE STUDY  08/2004   official report unavailable, but reported couple of AVMs, nonbleeding  . PARTIAL COLECTOMY  1977   blockage/gangrene? unclear if small bowel or colon  . THORACENTESIS Left 03/10/16   1 L transudative fluid     Family History  Problem Relation Age of Onset  . Diabetes Mother   . Diabetes Father   . Heart disease Father   . Cancer Sister        unknown  . Cancer Brother        unknown  . Diabetes Brother   . Colon cancer Neg Hx      Social History   Social History  . Marital status: Widowed    Spouse name: N/A  . Number of children: 2  . Years of education: N/A   Occupational History  . Not on  file.   Social History Main Topics  . Smoking status: Former Smoker    Packs/day: 2.00    Years: 40.00    Types: Cigarettes    Quit date: 01/13/1998  . Smokeless tobacco: Never Used  . Alcohol use No  . Drug use: No  . Sexual activity: Not on file   Other Topics Concern  . Not on file   Social History Narrative  . No narrative on file     BP 124/74 (BP Location: Left Arm)   Pulse 87   Ht  (1.626 m)   Wt 149 lb (67.6 kg)   SpO2 94%   BMI 25.58 kg/m   Physical Exam:  Well appearing 83 year old woman, NAD HEENT: Unremarkable Neck:  6 cm JVD, no thyromegally Lymphatics:  No adenopathy Back:  No CVA tenderness Lungs:  Clear, with no wheezes, rales, or rhonchi HEART:  Regular rate rhythm, no murmurs, no rubs, no clicks Abd:  soft, positive bowel sounds, no organomegally, no rebound, no guarding Ext:  2 plus pulses, no edema, no cyanosis, no clubbing Skin:  No rashes no nodules Neuro:  CN II through XII intact, motor grossly intact   DEVICE  Normal device function.  See PaceArt for details.   Assess/Plan: 1. Paroxysmal atrial fibrillation - she is asymptomatic. She is maintaining sinus rhythm 99.9% of the time. 2. Sinus node dysfunction - she is asymptomatic status post pacemaker insertion. 3. Pacemaker - her Medtronic dual-chamber pacemaker is working normally. We'll recheck in several months. 4. Chronic systemic anticoagulation - we discussed switching her from her oral anticoagulants 2 warfarin today. After significant discussion regarding the pros and cons of switching, she prefers to continue Eliquis 5 mg twice daily.  Lewayne Bunting, M.D.

## 2017-07-20 NOTE — Patient Instructions (Signed)
Medication Instructions:  Your physician recommends that you continue on your current medications as directed. Please refer to the Current Medication list given to you today.  You have been given samples of Eliquis   Labwork: NONE   Testing/Procedures: NONE   Follow-Up: Your physician wants you to follow-up in: 1 Year with Dr. Ladona Ridgel. You will receive a reminder letter in the mail two months in advance. If you don't receive a letter, please call our office to schedule the follow-up appointment.  Your physician recommends that you schedule a follow-up appointment with the Device Clinic.   Any Other Special Instructions Will Be Listed Below (If Applicable).     If you need a refill on your cardiac medications before your next appointment, please call your pharmacy.  Thank you for choosing [facility]!

## 2017-07-21 DIAGNOSIS — Z23 Encounter for immunization: Secondary | ICD-10-CM | POA: Diagnosis not present

## 2017-09-21 ENCOUNTER — Ambulatory Visit (INDEPENDENT_AMBULATORY_CARE_PROVIDER_SITE_OTHER): Payer: Medicare Other | Admitting: Cardiology

## 2017-09-21 ENCOUNTER — Encounter: Payer: Self-pay | Admitting: Cardiology

## 2017-09-21 VITALS — BP 150/80 | HR 84 | Ht 64.0 in | Wt 146.0 lb

## 2017-09-21 DIAGNOSIS — I10 Essential (primary) hypertension: Secondary | ICD-10-CM

## 2017-09-21 DIAGNOSIS — I429 Cardiomyopathy, unspecified: Secondary | ICD-10-CM | POA: Diagnosis not present

## 2017-09-21 DIAGNOSIS — I495 Sick sinus syndrome: Secondary | ICD-10-CM

## 2017-09-21 DIAGNOSIS — Z950 Presence of cardiac pacemaker: Secondary | ICD-10-CM

## 2017-09-21 DIAGNOSIS — I480 Paroxysmal atrial fibrillation: Secondary | ICD-10-CM | POA: Diagnosis not present

## 2017-09-21 DIAGNOSIS — I6523 Occlusion and stenosis of bilateral carotid arteries: Secondary | ICD-10-CM

## 2017-09-21 NOTE — Patient Instructions (Addendum)
Your physician wants you to follow-up in: 6 months with Dr.McDowell You will receive a reminder letter in the mail two months in advance. If you don't receive a letter, please call our office to schedule the follow-up appointment.   Your physician recommends that you continue on your current medications as directed. Please refer to the Current Medication list given to you today.    Please get lab work JUST BEFORE NEXT VISIT in 6 months : CBC, BMET     If you need a refill on your cardiac medications before your next appointment, please call your pharmacy.  Eliquis samples 4 boxes [id number] lot, exp 12/2019   No test ordered today.      Thank you for choosing [facility] !

## 2017-09-21 NOTE — Progress Notes (Signed)
Cardiology Office Note  Date: 09/21/2017   ID: [name], DOB 09/05/1931, MRN [id number]  PCP: Benita StabileHall, John Z, MD  Primary Cardiologist: Nona DellSamuel Zeva Leber, MD   Chief Complaint  Patient presents with  . PAF    History of Present Illness: [name] is an 83 y.o. female last seen in June.  She is here with a friend for routine follow-up.  She does not report any major change in stamina, no palpitations, syncope, or chest pain.  She remains functional with ADLs.  She continues to follow with Dr. Ladona Ridgelaylor in the device clinic, Medtronic pacemaker in place.  Most recent device interrogation showed no mode switches.  She continues on Eliquis for stroke prophylaxis, denies any spontaneous bleeding problems.  Follow-up echocardiogram obtained in June showed stable LVEF in the range of 40-45%, mild to moderate mitral regurgitation, right ventricular dysfunction.  I personally reviewed her ECG today which shows an atrial paced rhythm with right bundle branch block.  Past Medical History:  Diagnosis Date  . Asthma   . COPD (chronic obstructive pulmonary disease) (HCC)    Oxygen dependent  . Coronary atherosclerosis of native coronary artery   . Essential hypertension, benign   . History of GI bleed    Ulcer  . Hyperlipidemia   . Old inferior wall myocardial infarction 1999   [facility]  . PAF (paroxysmal atrial fibrillation) (HCC)   . Sinus node dysfunction (HCC)    Medtronic PPM  . Type 2 diabetes mellitus (HCC)     Past Surgical History:  Procedure Laterality Date  . Cesareen section    . CHOLECYSTECTOMY    . COLONOSCOPY  09/2004   RMR: For obscure GI bleed. Left-sided diverticula, otherwise normal ileocolonoscopy  . Coronary artery stent  1999   BMS x 3 RCA  ([facility]), PTCRA RC4  . EP IMPLANTABLE DEVICE N/A 06/26/2016   Procedure: Pacemaker Implant;  Surgeon: Will Jorja LoaMartin Camnitz, MD;  [facility];  Service: Cardiovascular;  Laterality: N/A;  .  ESOPHAGOGASTRODUODENOSCOPY  11 2005   RMR: For melena, normal exam.  . EXPLORATORY LAPAROTOMY    . GIVENS CAPSULE STUDY  08/2004   official report unavailable, but reported couple of AVMs, nonbleeding  . PARTIAL COLECTOMY  1977   blockage/gangrene? unclear if small bowel or colon  . THORACENTESIS Left 03/10/16   1 L transudative fluid    Current Outpatient Medications  Medication Sig Dispense Refill  . apixaban (ELIQUIS) 5 MG TABS tablet Take 1 tablet (5 mg total) by mouth 2 (two) times daily. 60 tablet 0  . bisoprolol (ZEBETA) 5 MG tablet Take 1 tablet (5 mg total) by mouth daily. Pt needs appointment for further refills. (Patient taking differently: Take 5 mg by mouth daily. ) 90 tablet 0  . escitalopram (LEXAPRO) 20 MG tablet Take 10 mg by mouth daily.     . furosemide (LASIX) 40 MG tablet Take 40 mg by mouth daily. Daily at 0800 am    . ipratropium-albuterol (DUONEB) 0.5-2.5 (3) MG/3ML SOLN Take 3 mLs by nebulization 3 (three) times daily. (Patient taking differently: Take 3 mLs by nebulization every 6 (six) hours as needed (for shortness of breath). ) 360 mL   . LORazepam (ATIVAN) 0.5 MG tablet Take 1 tablet (0.5 mg total) by mouth daily. Take one tablet by mouth once daily (Patient taking differently: Take 0.5 mg by mouth at bedtime. Take one tablet by mouth once daily) 30 tablet 0  . Multiple Vitamins-Minerals (  CENTRUM SILVER 50+WOMEN PO) Take 1 tablet by mouth daily.    . nitroGLYCERIN (NITROSTAT) 0.4 MG SL tablet Place 1 tablet (0.4 mg total) under the tongue every 5 (five) minutes as needed for chest pain. 25 tablet 3  . omeprazole (PRILOSEC) 20 MG capsule Take 1 capsule (20 mg total) by mouth 2 (two) times daily before a meal. 60 capsule 11  . OXYGEN Inhale 2.5 L into the lungs continuous.    . potassium chloride (K-DUR,KLOR-CON) 10 MEQ tablet Take 10 mEq by mouth 2 (two) times daily.    . vitamin B-12 (CYANOCOBALAMIN) 1000 MCG tablet Take 1,000 mcg by mouth daily.     No current  facility-administered medications for this visit.    Allergies:  Codeine and Heparin   Social History: The patient  reports that she quit smoking about 19 years ago. Her smoking use included cigarettes. She has a 80.00 pack-year smoking history. she has never used smokeless tobacco. She reports that she does not drink alcohol or use drugs.   ROS:  Please see the history of present illness. Otherwise, complete review of systems is positive for arthritic stiffness.  All other systems are reviewed and negative.   Physical Exam: VS:  BP (!) 150/80 (BP Location: Right Arm)   Pulse 84   Ht 5\' 4"  (1.626 m)   Wt 146 lb (66.2 kg)   SpO2 94%   BMI 25.06 kg/m , BMI Body mass index is 25.06 kg/m.  Wt Readings from Last 3 Encounters:  09/21/17 146 lb (66.2 kg)  07/20/17 149 lb (67.6 kg)  03/20/17 145 lb (65.8 kg)    General: Elderly woman, appears comfortable at rest.  Wearing oxygen via nasal cannula. HEENT: Conjunctiva and lids normal, oropharynx clear. Neck: Supple, no elevated JVP or carotid bruits, no thyromegaly. Lungs: Clear to auscultation, nonlabored breathing at rest. Cardiac: Regular rate and rhythm, no S3 or significant systolic murmur, no pericardial rub. Abdomen: Soft, nontender, bowel sounds present, no guarding or rebound. Extremities: No pitting edema, distal pulses 2+. Skin: Warm and dry. Musculoskeletal: No kyphosis. Neuropsychiatric: Alert and oriented x3, affect grossly appropriate.  ECG: I personally reviewed the tracing from 06/27/2016 which showed an atrial paced rhythm with right bundle branch block and diffuse nonspecific ST-T wave abnormalities.  Recent Labwork:  06/12/2016: Pro B Natriuretic peptide (BNP) 358.0; TSH 2.65 06/21/2016: ALT 15; AST 15 06/26/2016: BUN 12; Creatinine, Ser 0.82; Hemoglobin 11.1; Platelets 150; Potassium 3.7; Sodium 141   Other Studies Reviewed Today:  Echocardiogram 03/26/2017: Study Conclusions  - Left ventricle: The cavity size was  normal. Wall thickness was   increased in a pattern of mild LVH. Systolic function was mildly   to moderately reduced. The estimated ejection fraction was in the   range of 40% to 45%. Diffuse hypokinesis. Doppler parameters are   consistent with abnormal left ventricular relaxation (grade 1   diastolic dysfunction). - Regional wall motion abnormality: Akinesis of the mid inferior   and basal-mid inferolateral myocardium. - Ventricular septum: Septal motion showed abnormal function and   dyssynergy. These changes are consistent with intraventricular   conduction delay. - Aortic valve: Trileaflet; mildly thickened, mildly calcified   leaflets. Mild stenosis by morphologic appearance. There was   trivial regurgitation. - Mitral valve: There was mild to moderate regurgitation. - Right ventricle: The cavity size was severely dilated. Systolic   function was severely reduced.  Assessment and Plan:  1.  Paroxysmal atrial fibrillation.  She continues to do well without  recurrent palpitations.  Continue Eliquis for stroke prophylaxis.  Obtain CBC and BMET for next visit.  2.  Secondary cardiomyopathy, stable LVEF at 40-45% by follow-up echocardiogram.  No changes to current regimen which includes Zebeta, Lasix, and potassium supplements.  ARB would be a reasonable addition depending on blood pressure and renal function.  3.  Sinus node dysfunction status post Medtronic pacemaker.  Keep follow-up with Dr. Ladona Ridgelaylor.  4.  Essential hypertension, keep follow-up with Dr. Margo AyeHall.  Current medicines were reviewed with the patient today.   Orders Placed This Encounter  Procedures  . CBC  . Basic Metabolic Panel (BMET)  . EKG 12-Lead    Disposition: Follow-up in 6 months.  Signed, Jonelle SidleSamuel G. Whitnie Deleon, MD, [facility]ACC 09/21/2017 11:41 AM    [facility] [address], [place], [place] [address] Phone: [phone]; Fax: [phone]

## 2017-11-02 IMAGING — DX DG CHEST 2V
2 series · 2 of 2 positions shown · non-contrast
Comparison: 01/14/2010

CLINICAL DATA: Shortness of breath for 2 weeks

EXAM:
CHEST  2 VIEW

[chest pa]
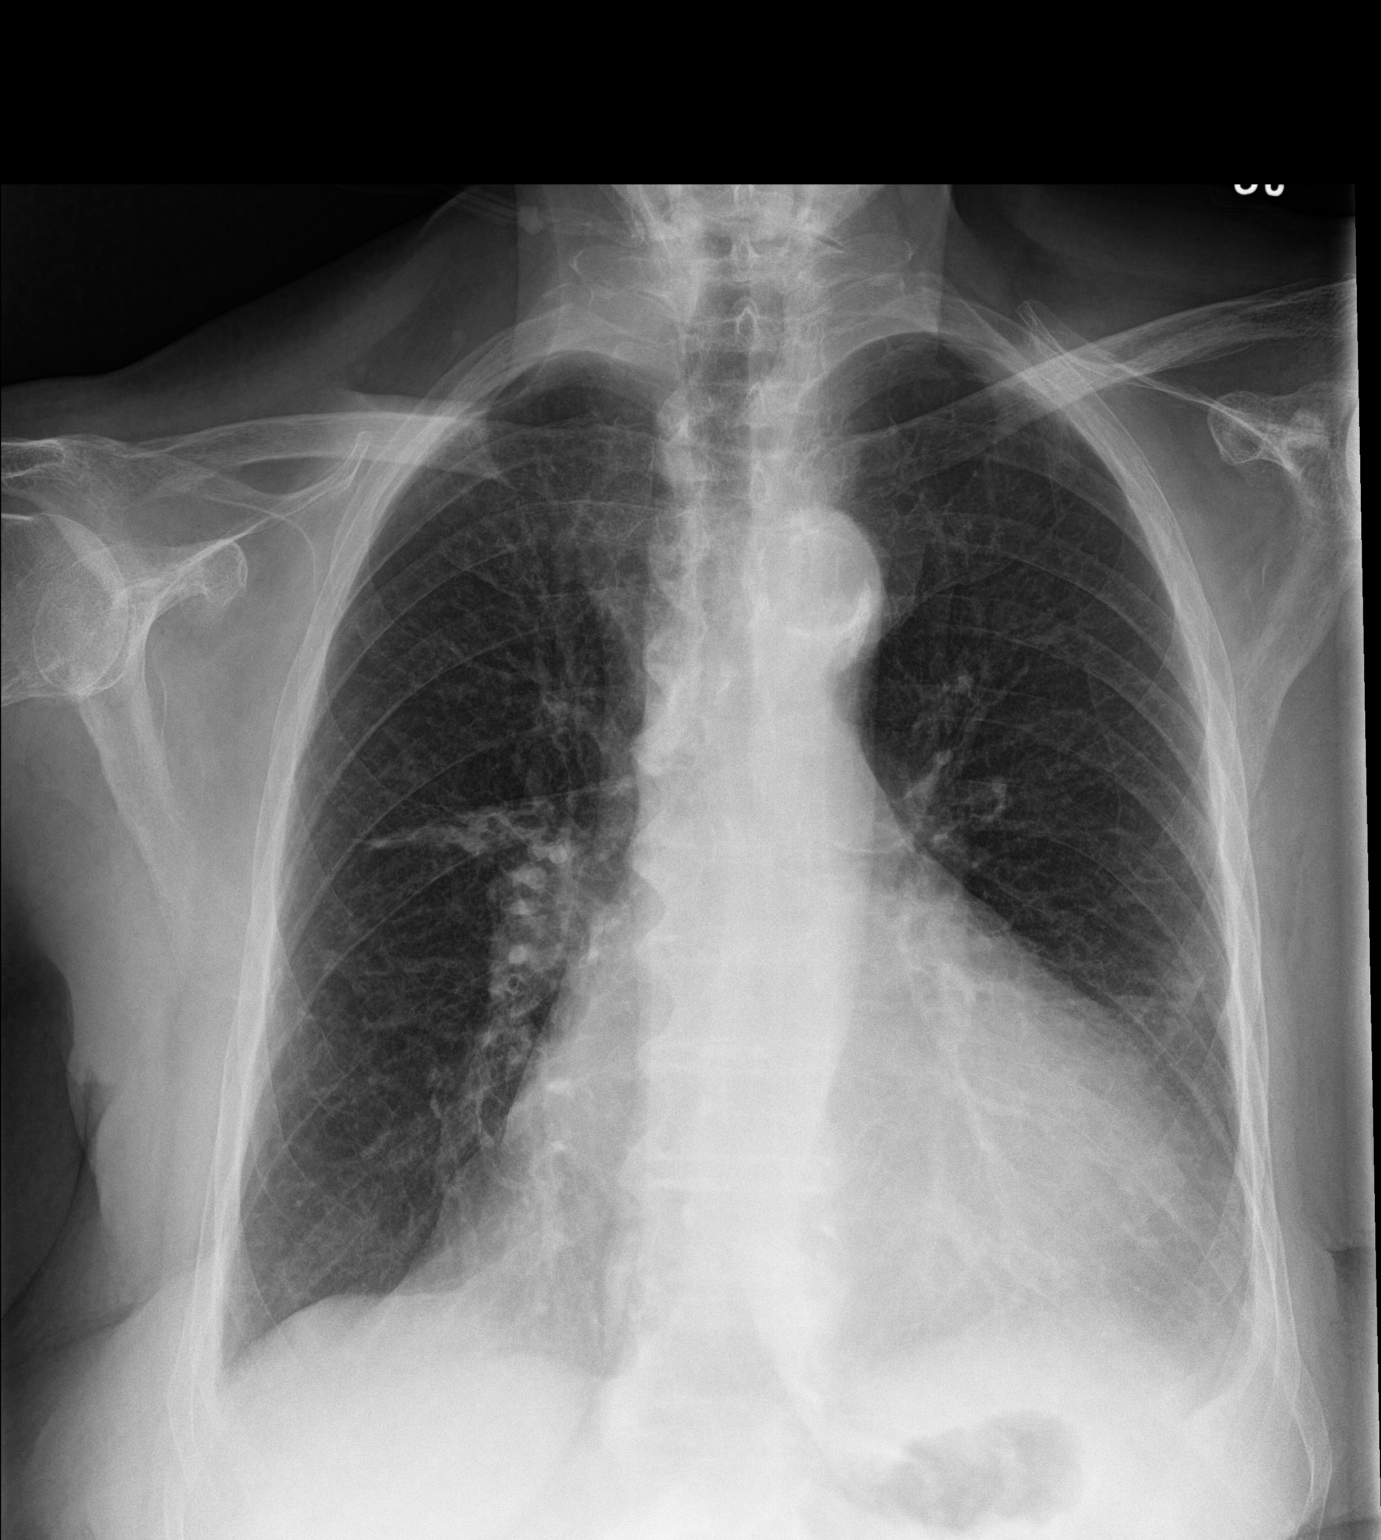

[chest lat]
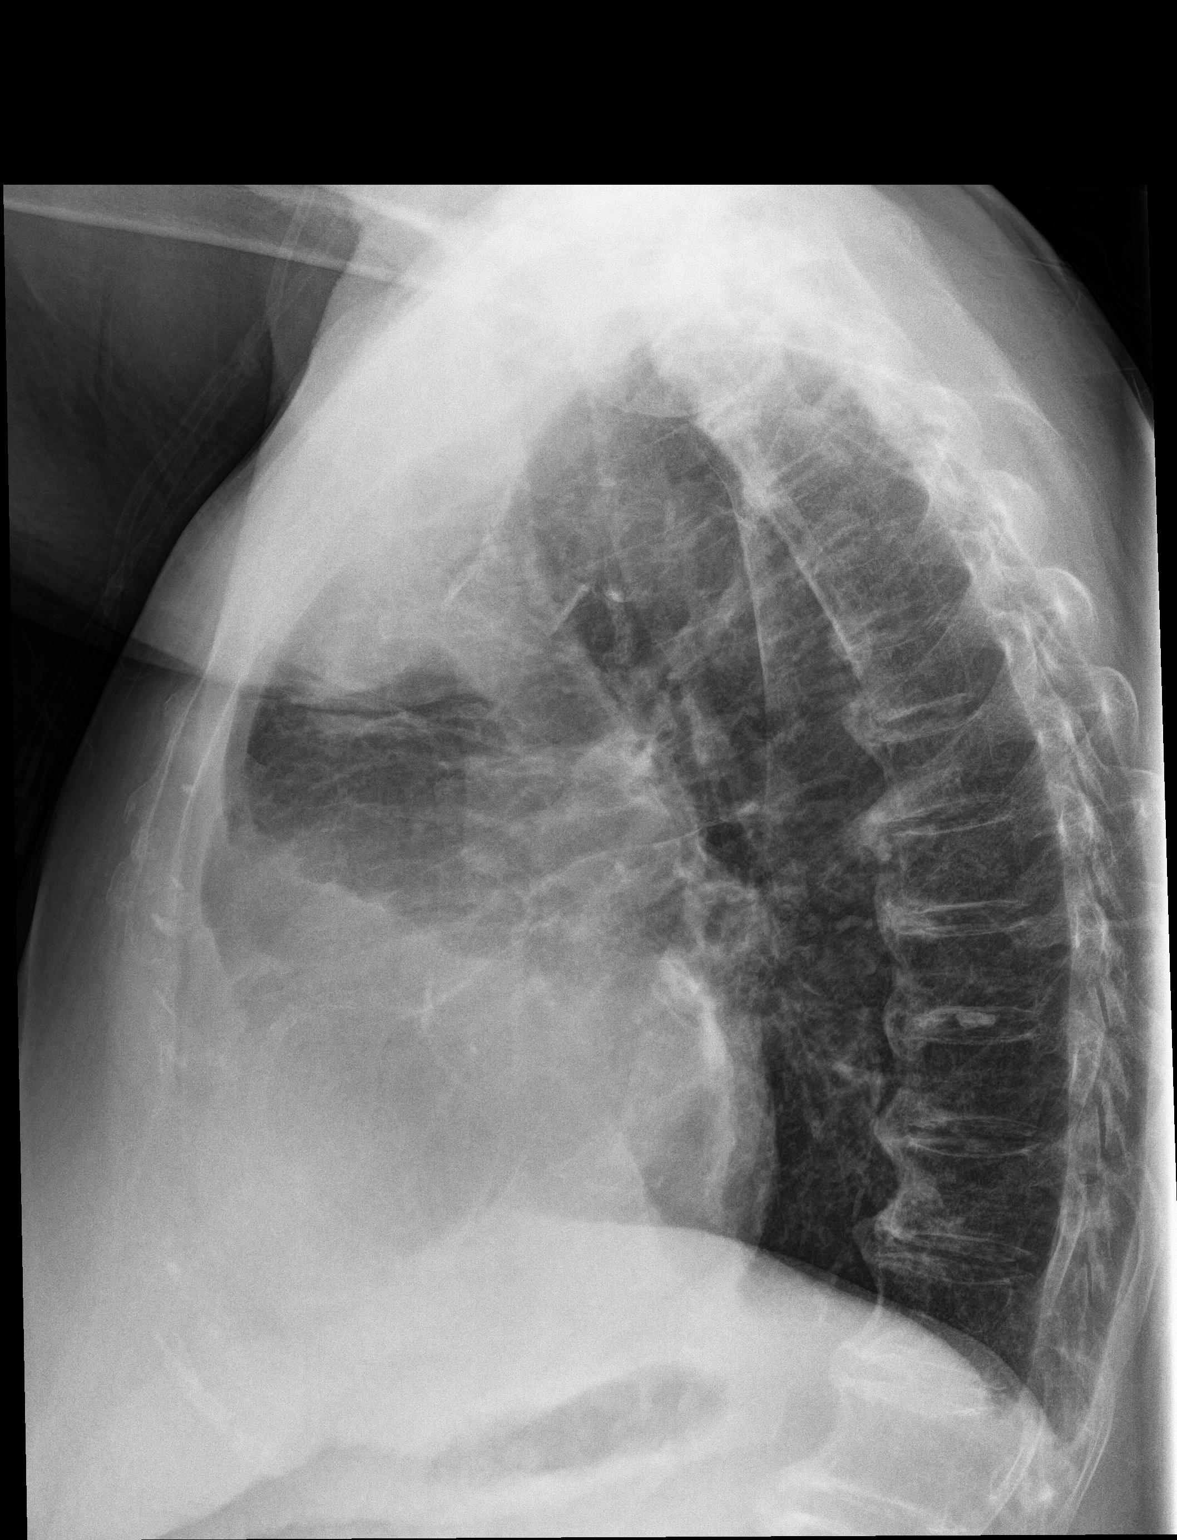

[2 of 2 positions shown; findings below may reference images not displayed]

FINDINGS: Cardiac shadow is enlarged. The lungs are well aerated bilaterally.
No focal infiltrate or sizable effusion is seen. Degenerative
changes of the thoracic spine are noted. Some linear scarring is
noted in the midportion of the lungs bilaterally.
IMPRESSION: No acute abnormality noted.

## 2017-11-20 ENCOUNTER — Ambulatory Visit (INDEPENDENT_AMBULATORY_CARE_PROVIDER_SITE_OTHER): Payer: Medicare Other | Admitting: *Deleted

## 2017-11-20 DIAGNOSIS — R001 Bradycardia, unspecified: Secondary | ICD-10-CM | POA: Diagnosis not present

## 2017-11-20 LAB — CUP PACEART INCLINIC DEVICE CHECK
Brady Statistic AP VP Percent: 0.12 %
Brady Statistic AS VP Percent: 0 %
Brady Statistic AS VS Percent: 0.42 %
Implantable Lead Implant Date: 20170907
Implantable Lead Model: 5076
Lead Channel Impedance Value: 285 Ohm
Lead Channel Impedance Value: 399 Ohm
Lead Channel Impedance Value: 437 Ohm
Lead Channel Pacing Threshold Amplitude: 0.5 V
Lead Channel Pacing Threshold Pulse Width: 0.4 ms
Lead Channel Sensing Intrinsic Amplitude: 4.125 mV
Lead Channel Sensing Intrinsic Amplitude: 4.25 mV
Lead Channel Setting Pacing Amplitude: 2 V
Lead Channel Setting Pacing Amplitude: 2.5 V
Lead Channel Setting Pacing Pulse Width: 0.4 ms
Lead Channel Setting Sensing Sensitivity: 0.45 mV
MDC IDC LEAD IMPLANT DT: 20170907
MDC IDC LEAD LOCATION: 753859
MDC IDC LEAD LOCATION: 753860
MDC IDC MSMT BATTERY REMAINING LONGEVITY: 97 mo
MDC IDC MSMT BATTERY VOLTAGE: 3.02 V
MDC IDC MSMT LEADCHNL RA PACING THRESHOLD AMPLITUDE: 0.75 V
MDC IDC MSMT LEADCHNL RA PACING THRESHOLD PULSEWIDTH: 0.4 ms
MDC IDC MSMT LEADCHNL RA SENSING INTR AMPL: 2.5 mV
MDC IDC MSMT LEADCHNL RA SENSING INTR AMPL: 2.75 mV
MDC IDC MSMT LEADCHNL RV IMPEDANCE VALUE: 589 Ohm
MDC IDC PG IMPLANT DT: 20170907
MDC IDC SESS DTM: 20190201161954
MDC IDC STAT BRADY AP VS PERCENT: 99.45 %
MDC IDC STAT BRADY RA PERCENT PACED: 99.54 %
MDC IDC STAT BRADY RV PERCENT PACED: 0.16 %

## 2017-11-20 NOTE — Progress Notes (Signed)
Pacemaker check in clinic. Normal device function. Thresholds, sensing, impedances consistent with previous measurements. Device programmed to maximize longevity. No mode switch or high ventricular rates noted. Device programmed at appropriate safety margins. Histogram distribution appropriate for patient activity level. Device programmed to optimize intrinsic conduction. Estimated longevity 8 years. Patient will follow up with GT/R in 6 months.

## 2017-12-08 IMAGING — CR DG CHEST 1V PORT
1 series · 1 of 1 positions shown · non-contrast
Comparison: 03/15/2016

CLINICAL DATA: Difficulty breathing

EXAM:
PORTABLE CHEST 1 VIEW

[ap]
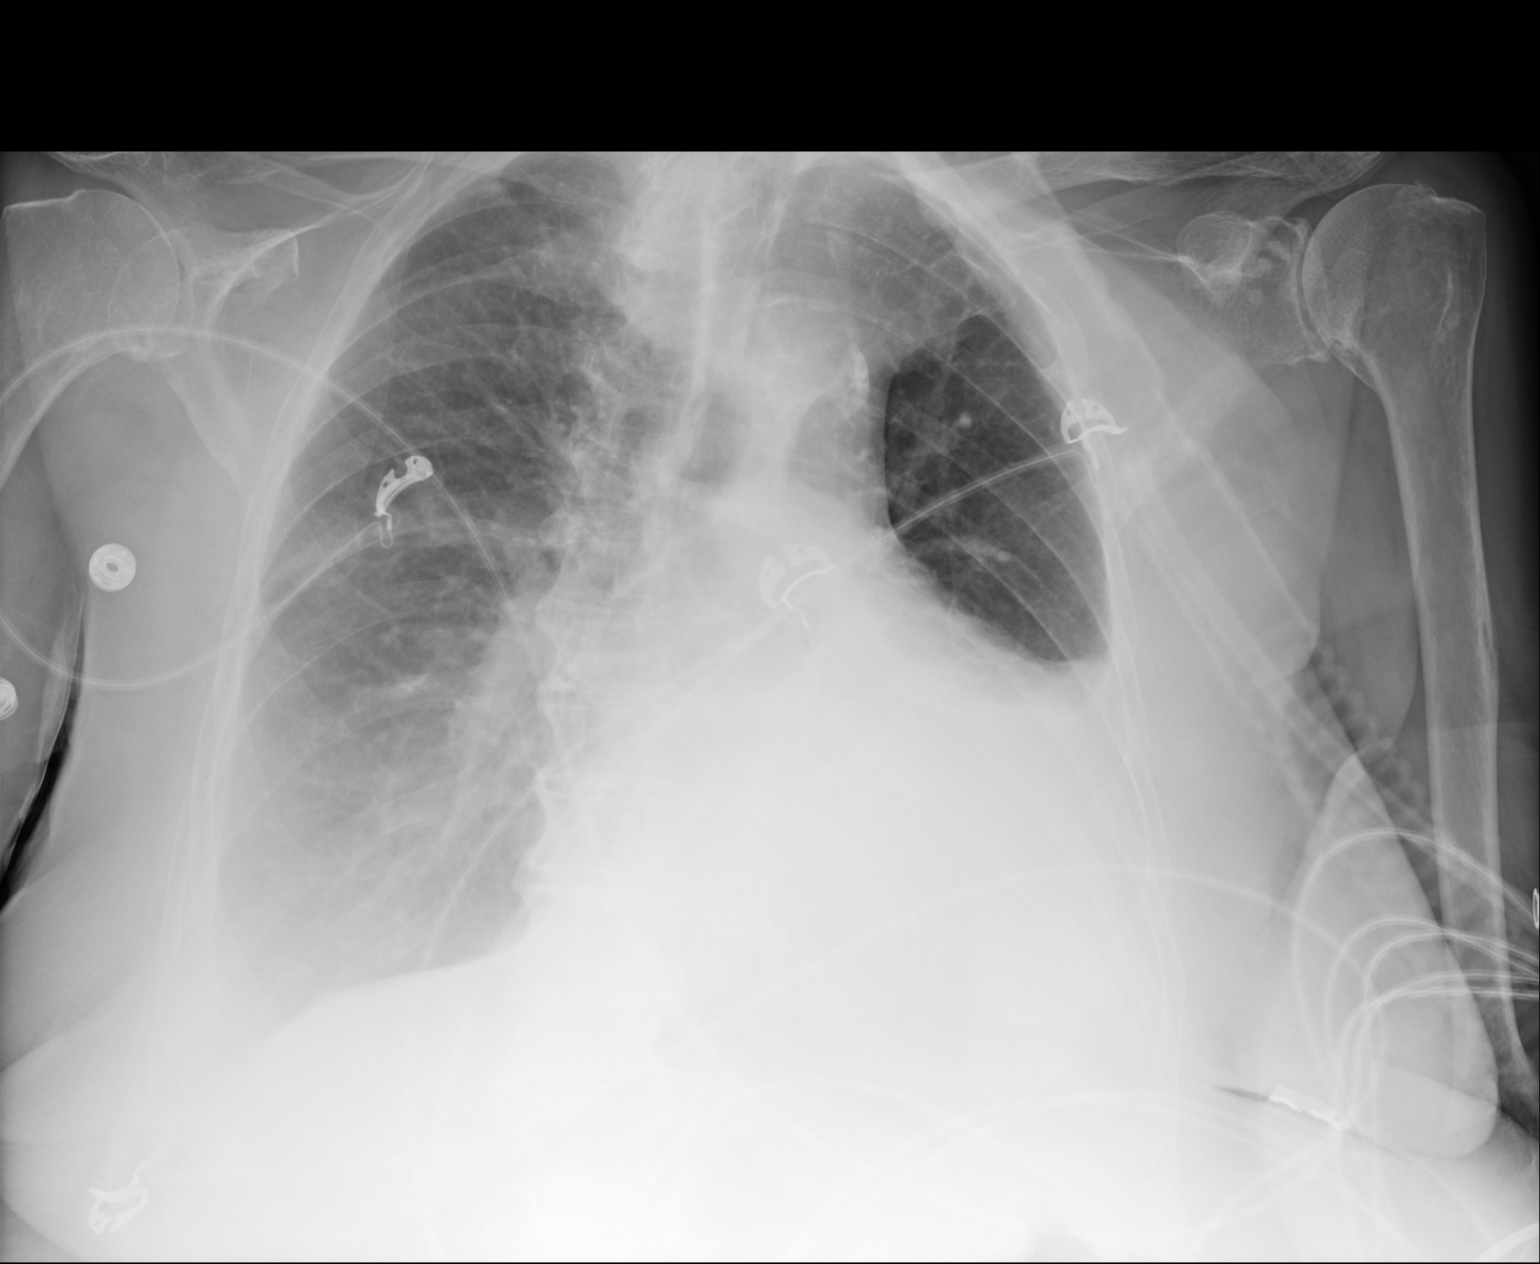

[1 of 1 positions shown; findings below may reference images not displayed]

FINDINGS: Cardiac shadow is stable. Bilateral pleural effusions are again seen
and stable. Left basilar consolidation is again noted. Aortic
calcifications are again seen. No acute bony abnormality is noted.
IMPRESSION: Bilateral pleural effusions left greater than right.

Left basilar consolidation is noted.

## 2018-01-11 DIAGNOSIS — D509 Iron deficiency anemia, unspecified: Secondary | ICD-10-CM | POA: Diagnosis not present

## 2018-01-11 DIAGNOSIS — E782 Mixed hyperlipidemia: Secondary | ICD-10-CM | POA: Diagnosis not present

## 2018-01-11 DIAGNOSIS — I10 Essential (primary) hypertension: Secondary | ICD-10-CM | POA: Diagnosis not present

## 2018-01-11 DIAGNOSIS — E119 Type 2 diabetes mellitus without complications: Secondary | ICD-10-CM | POA: Diagnosis not present

## 2018-01-13 DIAGNOSIS — I4891 Unspecified atrial fibrillation: Secondary | ICD-10-CM | POA: Diagnosis not present

## 2018-01-13 DIAGNOSIS — E119 Type 2 diabetes mellitus without complications: Secondary | ICD-10-CM | POA: Diagnosis not present

## 2018-01-13 DIAGNOSIS — I10 Essential (primary) hypertension: Secondary | ICD-10-CM | POA: Diagnosis not present

## 2018-01-13 DIAGNOSIS — E782 Mixed hyperlipidemia: Secondary | ICD-10-CM | POA: Diagnosis not present

## 2018-01-13 DIAGNOSIS — Z0000 Encounter for general adult medical examination without abnormal findings: Secondary | ICD-10-CM | POA: Diagnosis not present

## 2018-01-13 DIAGNOSIS — I5032 Chronic diastolic (congestive) heart failure: Secondary | ICD-10-CM | POA: Diagnosis not present

## 2018-01-13 DIAGNOSIS — J449 Chronic obstructive pulmonary disease, unspecified: Secondary | ICD-10-CM | POA: Diagnosis not present

## 2018-04-12 NOTE — Progress Notes (Signed)
Cardiology Office Note  Date: 04/13/2018   ID: [name], DOB 1931-08-14, MRN [id number]  PCP: Kathy Stabile, MD  Primary Cardiologist: Kathy Dell, MD   Chief Complaint  Patient presents with  . PAF    History of Present Illness: [name] is an 84 y.o. female last seen in December 2018.  She is here today for a routine visit.  She does not report any palpitations or chest pain.  Still using supplemental oxygen with follow-up per PCP.  She continues to see Dr. Ladona Page with follow-up in the device clinic, Medtronic pacemaker in place.  Device check in February did not demonstrate any mode switches.  She continues on Eliquis.  She does not report any spontaneous bleeding episodes.  We are requesting interval lab work from Dr. Margo Page done back in March.  Past Medical History:  Diagnosis Date  . Asthma   . COPD (chronic obstructive pulmonary disease) (HCC)    Oxygen dependent  . Coronary atherosclerosis of native coronary artery   . Essential hypertension, benign   . History of GI bleed    Ulcer  . Hyperlipidemia   . Old inferior wall myocardial infarction 1999   [facility]  . PAF (paroxysmal atrial fibrillation) (HCC)   . Sinus node dysfunction (HCC)    Medtronic PPM  . Type 2 diabetes mellitus (HCC)     Past Surgical History:  Procedure Laterality Date  . Cesareen section    . CHOLECYSTECTOMY    . COLONOSCOPY  09/2004   RMR: For obscure GI bleed. Left-sided diverticula, otherwise normal ileocolonoscopy  . Coronary artery stent  1999   BMS x 3 RCA  ([facility]), PTCRA RC4  . EP IMPLANTABLE DEVICE N/A 06/26/2016   Procedure: Pacemaker Implant;  Surgeon: Kathy Jorja Loa, MD;  [facility];  Service: Cardiovascular;  Laterality: N/A;  . ESOPHAGOGASTRODUODENOSCOPY  11 2005   RMR: For melena, normal exam.  . EXPLORATORY LAPAROTOMY    . GIVENS CAPSULE STUDY  08/2004   official report unavailable, but reported couple of AVMs, nonbleeding  . PARTIAL  COLECTOMY  1977   blockage/gangrene? unclear if small bowel or colon  . THORACENTESIS Left 03/10/16   1 L transudative fluid    Current Outpatient Medications  Medication Sig Dispense Refill  . apixaban (ELIQUIS) 5 MG TABS tablet Take 1 tablet (5 mg total) by mouth 2 (two) times daily. 60 tablet 0  . bisoprolol (ZEBETA) 5 MG tablet Take 1 tablet (5 mg total) by mouth daily. Pt needs appointment for further refills. (Patient taking differently: Take 5 mg by mouth daily. ) 90 tablet 0  . escitalopram (LEXAPRO) 20 MG tablet Take 10 mg by mouth daily.     . furosemide (LASIX) 40 MG tablet Take 40 mg by mouth daily. Daily at 0800 am    . ipratropium-albuterol (DUONEB) 0.5-2.5 (3) MG/3ML SOLN Take 3 mLs by nebulization 3 (three) times daily. (Patient taking differently: Take 3 mLs by nebulization every 6 (six) hours as needed (for shortness of breath). ) 360 mL   . LORazepam (ATIVAN) 0.5 MG tablet Take 1 tablet (0.5 mg total) by mouth daily. Take one tablet by mouth once daily (Patient taking differently: Take 0.5 mg by mouth at bedtime. Take one tablet by mouth once daily) 30 tablet 0  . Multiple Vitamins-Minerals (CENTRUM SILVER 50+WOMEN PO) Take 1 tablet by mouth daily.    . nitroGLYCERIN (NITROSTAT) 0.4 MG SL tablet Place 1 tablet (0.4 mg  total) under the tongue every 5 (five) minutes as needed for chest pain. 25 tablet 3  . omeprazole (PRILOSEC) 20 MG capsule Take 1 capsule (20 mg total) by mouth 2 (two) times daily before a meal. 60 capsule 11  . OXYGEN Inhale 2.5 L into the lungs continuous.    . potassium chloride (K-DUR,KLOR-CON) 10 MEQ tablet Take 10 mEq by mouth 2 (two) times daily.    . vitamin B-12 (CYANOCOBALAMIN) 1000 MCG tablet Take 1,000 mcg by mouth daily.     No current facility-administered medications for this visit.    Allergies:  Codeine and Heparin   Social History: The patient  reports that she quit smoking about 20 years ago. Her smoking use included cigarettes. She has a  80.00 pack-year smoking history. She has never used smokeless tobacco. She reports that she does not drink alcohol or use drugs.   ROS:  Please see the history of present illness. Otherwise, complete review of systems is positive for chronic dyspnea on exertion, hearing loss.  All other systems are reviewed and negative.   Physical Exam: VS:  BP 136/80   Pulse 90   Ht 5\' 4"  (1.626 m)   Wt 151 lb (68.5 kg)   SpO2 93%   BMI 25.92 kg/m , BMI Body mass index is 25.92 kg/m.  Wt Readings from Last 3 Encounters:  04/13/18 151 lb (68.5 kg)  09/21/17 146 lb (66.2 kg)  07/20/17 149 lb (67.6 kg)    General: Elderly woman wearing supplemental oxygen via nasal cannula. HEENT: Conjunctiva and lids normal, oropharynx clear. Neck: Supple, no elevated JVP or carotid bruits, no thyromegaly. Lungs: Diminished breath sounds without wheezing, nonlabored breathing at rest. Cardiac: Regular rate and rhythm, no S3 or significant systolic murmur, no pericardial rub. Abdomen: Soft, nontender, bowel sounds present. Extremities: No pitting edema, distal pulses 2+. Skin: Warm and dry. Musculoskeletal: No kyphosis. Neuropsychiatric: Alert and oriented x3, affect grossly appropriate.  ECG: I personally reviewed the tracing from 09/21/2017 which showed an atrial paced rhythm with right bundle branch block.  Other Studies Reviewed Today:  Echocardiogram 03/26/2017: Study Conclusions  - Left ventricle: The cavity size was normal. Wall thickness was increased in a pattern of mild LVH. Systolic function was mildly to moderately reduced. The estimated ejection fraction was in the range of 40% to 45%. Diffuse hypokinesis. Doppler parameters are consistent with abnormal left ventricular relaxation (grade 1 diastolic dysfunction). - Regional wall motion abnormality: Akinesis of the mid inferior and basal-mid inferolateral myocardium. - Ventricular septum: Septal motion showed abnormal function  and dyssynergy. These changes are consistent with intraventricular conduction delay. - Aortic valve: Trileaflet; mildly thickened, mildly calcified leaflets. Mild stenosis by morphologic appearance. There was trivial regurgitation. - Mitral valve: There was mild to moderate regurgitation. - Right ventricle: The cavity size was severely dilated. Systolic function was severely reduced.  Assessment and Plan:  1.  Paroxysmal atrial fibrillation.  Continue Eliquis for stroke prophylaxis.  We are requesting interval lab work from Dr. Margo AyeHall.  2.  Secondary cardiomyopathy with LVEF 40 to 45%.  Continue present medical regimen.  3.  Sinus node dysfunction with Medtronic pacemaker in place.  Keep follow-up in the device clinic with Dr. Ladona Ridgelaylor.  4.  Essential hypertension, following with Dr. Margo AyeHall.  Current medicines were reviewed with the patient today.  Disposition: Follow-up in 6 months.  Signed, Jonelle SidleSamuel G. Trinna Kunst, MD, [facility] InstituteFACC 04/13/2018 11:57 AM    [facility] [address], [place], [place] [address] Phone: [phone]; Fax: [phone]

## 2018-04-13 ENCOUNTER — Ambulatory Visit (INDEPENDENT_AMBULATORY_CARE_PROVIDER_SITE_OTHER): Payer: Medicare Other | Admitting: Cardiology

## 2018-04-13 ENCOUNTER — Encounter: Payer: Self-pay | Admitting: Cardiology

## 2018-04-13 ENCOUNTER — Other Ambulatory Visit: Payer: Self-pay

## 2018-04-13 VITALS — BP 136/80 | HR 90 | Ht 64.0 in | Wt 151.0 lb

## 2018-04-13 DIAGNOSIS — I480 Paroxysmal atrial fibrillation: Secondary | ICD-10-CM

## 2018-04-13 DIAGNOSIS — I429 Cardiomyopathy, unspecified: Secondary | ICD-10-CM | POA: Diagnosis not present

## 2018-04-13 DIAGNOSIS — I495 Sick sinus syndrome: Secondary | ICD-10-CM | POA: Diagnosis not present

## 2018-04-13 DIAGNOSIS — I10 Essential (primary) hypertension: Secondary | ICD-10-CM | POA: Diagnosis not present

## 2018-04-13 NOTE — Patient Instructions (Signed)

## 2018-05-01 ENCOUNTER — Other Ambulatory Visit: Payer: Self-pay

## 2018-05-01 ENCOUNTER — Encounter (HOSPITAL_COMMUNITY): Payer: Self-pay | Admitting: Emergency Medicine

## 2018-05-01 ENCOUNTER — Emergency Department (HOSPITAL_COMMUNITY)
Admission: EM | Admit: 2018-05-01 | Discharge: 2018-05-01 | Disposition: A | Discharge status: Home / Self Care | Payer: Medicare Other | Attending: Emergency Medicine | Admitting: Emergency Medicine

## 2018-05-01 ENCOUNTER — Emergency Department (HOSPITAL_COMMUNITY): Payer: Medicare Other

## 2018-05-01 DIAGNOSIS — I2510 Atherosclerotic heart disease of native coronary artery without angina pectoris: Secondary | ICD-10-CM | POA: Diagnosis not present

## 2018-05-01 DIAGNOSIS — Z79899 Other long term (current) drug therapy: Secondary | ICD-10-CM | POA: Diagnosis not present

## 2018-05-01 DIAGNOSIS — R0602 Shortness of breath: Secondary | ICD-10-CM | POA: Diagnosis not present

## 2018-05-01 DIAGNOSIS — I110 Hypertensive heart disease with heart failure: Secondary | ICD-10-CM | POA: Diagnosis not present

## 2018-05-01 DIAGNOSIS — Z87891 Personal history of nicotine dependence: Secondary | ICD-10-CM | POA: Diagnosis not present

## 2018-05-01 DIAGNOSIS — R079 Chest pain, unspecified: Secondary | ICD-10-CM | POA: Diagnosis not present

## 2018-05-01 DIAGNOSIS — R062 Wheezing: Secondary | ICD-10-CM | POA: Diagnosis not present

## 2018-05-01 DIAGNOSIS — I5040 Unspecified combined systolic (congestive) and diastolic (congestive) heart failure: Secondary | ICD-10-CM | POA: Diagnosis not present

## 2018-05-01 DIAGNOSIS — J90 Pleural effusion, not elsewhere classified: Secondary | ICD-10-CM | POA: Insufficient documentation

## 2018-05-01 DIAGNOSIS — E119 Type 2 diabetes mellitus without complications: Secondary | ICD-10-CM | POA: Insufficient documentation

## 2018-05-01 DIAGNOSIS — J441 Chronic obstructive pulmonary disease with (acute) exacerbation: Secondary | ICD-10-CM | POA: Diagnosis not present

## 2018-05-01 LAB — CBC WITH DIFFERENTIAL/PLATELET
BASOS ABS: 0 10*3/uL (ref 0.0–0.1)
Basophils Relative: 1 %
EOS ABS: 0.1 10*3/uL (ref 0.0–0.7)
Eosinophils Relative: 2 %
HEMATOCRIT: 38.5 % (ref 36.0–46.0)
Hemoglobin: 11.3 g/dL — ABNORMAL LOW (ref 12.0–15.0)
Lymphocytes Relative: 26 %
Lymphs Abs: 1.4 10*3/uL (ref 0.7–4.0)
MCH: 28.7 pg (ref 26.0–34.0)
MCHC: 29.4 g/dL — AB (ref 30.0–36.0)
MCV: 97.7 fL (ref 78.0–100.0)
MONO ABS: 0.6 10*3/uL (ref 0.1–1.0)
Monocytes Relative: 11 %
NEUTROS ABS: 3.3 10*3/uL (ref 1.7–7.7)
NEUTROS PCT: 60 %
PLATELETS: 188 10*3/uL (ref 150–400)
RBC: 3.94 MIL/uL (ref 3.87–5.11)
RDW: 12.9 % (ref 11.5–15.5)
WBC: 5.4 10*3/uL (ref 4.0–10.5)

## 2018-05-01 LAB — BASIC METABOLIC PANEL
ANION GAP: 8 (ref 5–15)
BUN: 23 mg/dL (ref 8–23)
CO2: 37 mmol/L — ABNORMAL HIGH (ref 22–32)
CREATININE: 0.95 mg/dL (ref 0.44–1.00)
Calcium: 9.2 mg/dL (ref 8.9–10.3)
Chloride: 98 mmol/L (ref 98–111)
GFR, EST NON AFRICAN AMERICAN: 53 mL/min — AB (ref >60)
GLUCOSE: 149 mg/dL — AB (ref 70–99)
Potassium: 4.3 mmol/L (ref 3.5–5.1)
Sodium: 143 mmol/L (ref 135–145)

## 2018-05-01 LAB — TROPONIN I

## 2018-05-01 MED ORDER — ALBUTEROL SULFATE (2.5 MG/3ML) 0.083% IN NEBU
5.0000 mg | INHALATION_SOLUTION | Freq: Once | RESPIRATORY_TRACT | Status: AC
  Administered 2018-05-01: 5 mg via RESPIRATORY_TRACT
  Filled 2018-05-01: qty 6

## 2018-05-01 MED ORDER — PREDNISONE 50 MG PO TABS
60.0000 mg | ORAL_TABLET | Freq: Once | ORAL | Status: AC
  Administered 2018-05-01: 60 mg via ORAL
  Filled 2018-05-01: qty 1

## 2018-05-01 MED ORDER — PREDNISONE 5 MG PO TABS: 5.0000 mg | ORAL_TABLET | Freq: Every day | ORAL | 0 refills | Status: DC

## 2018-05-01 MED ORDER — IPRATROPIUM BROMIDE 0.02 % IN SOLN
0.5000 mg | Freq: Once | RESPIRATORY_TRACT | Status: AC
  Administered 2018-05-01: 0.5 mg via RESPIRATORY_TRACT
  Filled 2018-05-01: qty 2.5

## 2018-05-01 NOTE — Discharge Instructions (Signed)
Chest x-ray shows a left pleural effusion which is a collection of fluid at the base of your left lung.  This was also noted in an x-ray in 2017.  Prescription for prednisone.  Follow-up with your primary care doctor.  Return if worse.

## 2018-05-01 NOTE — ED Triage Notes (Signed)
Pt c/o SOB for a few days but worse today, pt normally on 2L home O2, denies chest pain

## 2018-05-01 NOTE — ED Provider Notes (Signed)
Patient rechecked at 0945: Breathing pattern appears normal.  No acute distress.  Oxygen saturations in the mid 90s.  She is slightly tachycardic.  Patient appears stable for discharge.  Discharge medication prednisone 15 mg x 3 days 10 mg on 3 days, 5 mg on 3 days.   Donnetta Hutchingook, Jojo Geving, MD 05/01/18 1000

## 2018-05-01 NOTE — ED Notes (Signed)
T/c to resp for breathing treatments

## 2018-05-01 NOTE — ED Provider Notes (Signed)
[facility] EMERGENCY DEPARTMENT Provider Note   CSN: [id number] Arrival date & time: 05/01/18  4098     History   Chief Complaint Chief Complaint  Patient presents with  . Shortness of Breath    HPI [place] [name] is a 84 y.o. female.  The history is provided by the patient.  Shortness of Breath  This is a new problem. The problem occurs frequently.The current episode started more than 2 days ago. The problem has been gradually worsening. Associated symptoms include cough, wheezing and leg swelling. Pertinent negatives include no fever, no hemoptysis, no chest pain and no vomiting. Associated medical issues include COPD.  Patient with history of COPD on home oxygen (2L), history of CAD, history of paroxysmal A. fib, history of sinus node dysfunction presents with shortness of breath.  She reports increasing shortness of breath for the past several days.  It is worsened with exertion, improved at rest.  No chest pain.  She reports fatigue.  She also reports leg swelling, but reports she has had this for a while.  Past Medical History:  Diagnosis Date  . Asthma   . COPD (chronic obstructive pulmonary disease) (HCC)    Oxygen dependent  . Coronary atherosclerosis of native coronary artery   . Essential hypertension, benign   . History of GI bleed    Ulcer  . Hyperlipidemia   . Old inferior wall myocardial infarction 1999   [facility]  . PAF (paroxysmal atrial fibrillation) (HCC)   . Sinus node dysfunction (HCC)    Medtronic PPM  . Type 2 diabetes mellitus [facility])     Patient Active Problem List   Diagnosis Date Noted  . UTI (urinary tract infection) 06/25/2016  . Symptomatic bradycardia 06/21/2016  . Digoxin toxicity 06/21/2016  . Chronic respiratory failure with hypoxia and hypercapnia (HCC) 06/13/2016  . Diarrhea 04/07/2016  . Anemia 04/07/2016  . Localized edema 04/07/2016  . Combined congestive systolic and diastolic heart failure (HCC) 03/20/2016  . Palliative care  encounter   . Goals of care, counseling/discussion   . Dyspnea   . Atrial fibrillation with RVR (HCC)   . Atrial fibrillation (HCC) 03/09/2016  . Hyperlipidemia   . Type 2 diabetes mellitus (HCC) 12/26/2009  . HYPERLIPIDEMIA 12/26/2009  . Essential hypertension, benign 12/26/2009  . Coronary atherosclerosis of native coronary artery 12/26/2009  . COPD (chronic obstructive pulmonary disease) (HCC) 12/26/2009    Past Surgical History:  Procedure Laterality Date  . Cesareen section    . CHOLECYSTECTOMY    . COLONOSCOPY  09/2004   RMR: For obscure GI bleed. Left-sided diverticula, otherwise normal ileocolonoscopy  . Coronary artery stent  1999   BMS x 3 RCA  ([facility]), PTCRA RC4  . EP IMPLANTABLE DEVICE N/A 06/26/2016   Procedure: Pacemaker Implant;  Surgeon: Will Jorja Loa, MD;  Location: [facility] INVASIVE CV LAB;  Service: Cardiovascular;  Laterality: N/A;  . ESOPHAGOGASTRODUODENOSCOPY  11 2005   RMR: For melena, normal exam.  . EXPLORATORY LAPAROTOMY    . GIVENS CAPSULE STUDY  08/2004   official report unavailable, but reported couple of AVMs, nonbleeding  . PARTIAL COLECTOMY  1977   blockage/gangrene? unclear if small bowel or colon  . THORACENTESIS Left 03/10/16   1 L transudative fluid     OB History   None      Home Medications    Prior to Admission medications   Medication Sig Start Date End Date Taking? Authorizing Provider  apixaban (ELIQUIS) 5 MG TABS tablet Take  1 tablet (5 mg total) by mouth 2 (two) times daily. 03/20/16   Erick BlinksMemon, Jehanzeb, MD  bisoprolol (ZEBETA) 5 MG tablet Take 1 tablet (5 mg total) by mouth daily. Pt needs appointment for further refills. Patient taking differently: Take 5 mg by mouth daily.  07/16/16   Nyoka CowdenWert, Michael B, MD  escitalopram (LEXAPRO) 20 MG tablet Take 10 mg by mouth daily.     [provider]  furosemide (LASIX) 40 MG tablet Take 40 mg by mouth daily. Daily at 0800 am    [provider]  ipratropium-albuterol (DUONEB)  0.5-2.5 (3) MG/3ML SOLN Take 3 mLs by nebulization 3 (three) times daily. Patient taking differently: Take 3 mLs by nebulization every 6 (six) hours as needed (for shortness of breath).  03/20/16   Erick BlinksMemon, Jehanzeb, MD  LORazepam (ATIVAN) 0.5 MG tablet Take 1 tablet (0.5 mg total) by mouth daily. Take one tablet by mouth once daily Patient taking differently: Take 0.5 mg by mouth at bedtime. Take one tablet by mouth once daily 04/15/16   Sharon SellerEubanks, Jessica K, NP  Multiple Vitamins-Minerals (CENTRUM SILVER 50+WOMEN PO) Take 1 tablet by mouth daily.    [provider]  nitroGLYCERIN (NITROSTAT) 0.4 MG SL tablet Place 1 tablet (0.4 mg total) under the tongue every 5 (five) minutes as needed for chest pain. 04/24/14   Jonelle SidleMcDowell, Samuel G, MD  omeprazole (PRILOSEC) 20 MG capsule Take 1 capsule (20 mg total) by mouth 2 (two) times daily before a meal. 06/12/16   Nyoka CowdenWert, Michael B, MD  OXYGEN Inhale 2.5 L into the lungs continuous.    [provider]  potassium chloride (K-DUR,KLOR-CON) 10 MEQ tablet Take 10 mEq by mouth 2 (two) times daily.    [provider]  vitamin B-12 (CYANOCOBALAMIN) 1000 MCG tablet Take 1,000 mcg by mouth daily.    [provider]    Family History Family History  Problem Relation Age of Onset  . Diabetes Mother   . Diabetes Father   . Heart disease Father   . Cancer Sister        unknown  . Cancer Brother        unknown  . Diabetes Brother   . Colon cancer Neg Hx     Social History Social History   Tobacco Use  . Smoking status: Former Smoker    Packs/day: 2.00    Years: 40.00    Pack years: 80.00    Types: Cigarettes    Last attempt to quit: 01/13/1998    Years since quitting: 20.3  . Smokeless tobacco: Never Used  Substance Use Topics  . Alcohol use: No    Alcohol/week: 0.0 oz  . Drug use: No     Allergies   Codeine and Heparin   Review of Systems Review of Systems  Constitutional: Positive for fatigue. Negative for fever.   Respiratory: Positive for cough, shortness of breath and wheezing. Negative for hemoptysis.   Cardiovascular: Positive for leg swelling. Negative for chest pain.  Gastrointestinal: Negative for vomiting.  All other systems reviewed and are negative.    Physical Exam Updated Vital Signs BP 124/82 (BP Location: Left Arm)   Pulse 100   Temp 98 F (36.7 C) (Oral)   Resp 20   Ht 1.626 m (5\' 4" )   Wt 68.5 kg (151 lb)   SpO2 100%   BMI 25.92 kg/m   Physical Exam  CONSTITUTIONAL: Elderly HEAD: Normocephalic/atraumatic EYES: EOMI/PERRL ENMT: Mucous membranes moist NECK: supple no meningeal signs  SPINE/BACK:entire spine nontender, kyphotic spine CV: Irregular, no loud murmurs LUNGS: Decreased breath sounds bilaterally, tachypnea ABDOMEN: soft, nontender, no rebound or guarding, bowel sounds noted throughout abdomen GU:no cva tenderness NEURO: Pt is awake/alert/appropriate, moves all extremitiesx4.  No facial droop.   EXTREMITIES: pulses normal/equal, full ROM Pitting edema lower extremities, left> right No calf tenderness SKIN: warm, color normal PSYCH: no abnormalities of mood noted, alert and oriented to situation  ED Treatments / Results  Labs (all labs ordered are listed, but only abnormal results are displayed) Labs Reviewed  BASIC METABOLIC PANEL - Abnormal; Notable for the following components:      Result Value   CO2 37 (*)    Glucose, Bld 149 (*)    GFR calc non Af Amer 53 (*)    All other components within normal limits  CBC WITH DIFFERENTIAL/PLATELET - Abnormal; Notable for the following components:   Hemoglobin 11.3 (*)    MCHC 29.4 (*)    All other components within normal limits  TROPONIN I    EKG EKG Interpretation  Date/Time:  Saturday May 01 2018 05:41:25 EDT Ventricular Rate:  107 PR Interval:    QRS Duration: 142 QT Interval:  399 QTC Calculation: 507 R Axis:   95 Text Interpretation:  Atrial fibrillation Ventricular premature complex RBBB  and LPFB Inferior infarct, age indeterminate Baseline wander in lead(s) I Interpretation limited secondary to artifact Confirmed by Zadie Rhine ([id number]) on 05/01/2018 5:54:27 AM   Radiology Dg Chest Port 1 View  Result Date: 05/01/2018 CLINICAL DATA:  Shortness of breath and chest pain EXAM: PORTABLE CHEST 1 VIEW COMPARISON:  Chest radiograph 06/27/2016 FINDINGS: There is a small left pleural effusion with associated atelectasis. Cardiomegaly is unchanged. Left chest wall pacemaker with leads in expected position. No pulmonary edema or focal consolidation. IMPRESSION: Small no pulmonary edema. Left pleural effusion with associated basilar atelectasis. Electronically Signed   By: Deatra Robinson M.D.   On: 05/01/2018 06:42    Procedures Procedures   Medications Ordered in ED Medications  albuterol (PROVENTIL) (2.5 MG/3ML) 0.083% nebulizer solution 5 mg (has no administration in time range)  albuterol (PROVENTIL) (2.5 MG/3ML) 0.083% nebulizer solution 5 mg (5 mg Nebulization Given 05/01/18 0551)  ipratropium (ATROVENT) nebulizer solution 0.5 mg (0.5 mg Nebulization Given 05/01/18 0551)  predniSONE (DELTASONE) tablet 60 mg (60 mg Oral Given 05/01/18 0716)     Initial Impression / Assessment and Plan / ED Course  I have reviewed the triage vital signs and the nursing notes.  Pertinent labs & imaging results that were available during my care of the patient were reviewed by me and considered in my medical decision making (see chart for details).     6:17 AM History of COPD presents with increasing shortness of breath.  She has decreased air movement bilaterally.  Nebulized therapy has been ordered.  She is already on Eliquis for atrial fibrillation. We will follow closely 7:22 AM Chest x-ray unchanged.  Patient now with wheezing.  Suspect COPD exacerbation. Patient will need further therapies.  Anticipate admission.  Signed out to Dr. Adriana Simas who will reassess patient and likely have her  admitted   Final Clinical Impressions(s) / ED Diagnoses   Final diagnoses:  Pleural effusion on left  COPD exacerbation [facility])    ED Discharge Orders    None       Zadie Rhine, MD 05/01/18 [phone]

## 2018-05-01 NOTE — ED Notes (Signed)
Patient left without signing discharge. She states she was not happy that she did not get admitted.

## 2018-05-07 DIAGNOSIS — F331 Major depressive disorder, recurrent, moderate: Secondary | ICD-10-CM | POA: Diagnosis not present

## 2018-05-07 DIAGNOSIS — R0602 Shortness of breath: Secondary | ICD-10-CM | POA: Diagnosis not present

## 2018-05-07 DIAGNOSIS — Z6826 Body mass index (BMI) 26.0-26.9, adult: Secondary | ICD-10-CM | POA: Diagnosis not present

## 2018-05-07 DIAGNOSIS — F410 Panic disorder [episodic paroxysmal anxiety] without agoraphobia: Secondary | ICD-10-CM | POA: Diagnosis not present

## 2018-05-07 DIAGNOSIS — J918 Pleural effusion in other conditions classified elsewhere: Secondary | ICD-10-CM | POA: Diagnosis not present

## 2018-05-10 ENCOUNTER — Other Ambulatory Visit (HOSPITAL_COMMUNITY): Payer: Self-pay | Admitting: Adult Health Nurse Practitioner

## 2018-05-10 ENCOUNTER — Ambulatory Visit (HOSPITAL_COMMUNITY)
Admission: RE | Admit: 2018-05-10 | Discharge: 2018-05-10 | Disposition: A | Discharge status: Home / Self Care | Payer: Medicare Other | Source: Ambulatory Visit | Attending: Adult Health Nurse Practitioner | Admitting: Adult Health Nurse Practitioner

## 2018-05-10 DIAGNOSIS — Z1231 Encounter for screening mammogram for malignant neoplasm of breast: Secondary | ICD-10-CM

## 2018-05-10 DIAGNOSIS — R0602 Shortness of breath: Secondary | ICD-10-CM | POA: Diagnosis not present

## 2018-05-10 DIAGNOSIS — I517 Cardiomegaly: Secondary | ICD-10-CM | POA: Diagnosis not present

## 2018-05-10 DIAGNOSIS — J90 Pleural effusion, not elsewhere classified: Secondary | ICD-10-CM | POA: Diagnosis not present

## 2018-05-10 DIAGNOSIS — J439 Emphysema, unspecified: Secondary | ICD-10-CM | POA: Insufficient documentation

## 2018-05-10 DIAGNOSIS — Z6826 Body mass index (BMI) 26.0-26.9, adult: Secondary | ICD-10-CM | POA: Diagnosis not present

## 2018-05-21 ENCOUNTER — Emergency Department (HOSPITAL_COMMUNITY): Payer: Medicare Other

## 2018-05-21 ENCOUNTER — Inpatient Hospital Stay (HOSPITAL_COMMUNITY): Payer: Medicare Other

## 2018-05-21 ENCOUNTER — Inpatient Hospital Stay (HOSPITAL_COMMUNITY)
Admission: EM | Admit: 2018-05-21 | Discharge: 2018-06-10 | DRG: 291 | Disposition: A | Discharge status: Skilled Nursing Facility | Payer: Medicare Other | Attending: Internal Medicine | Admitting: Internal Medicine

## 2018-05-21 ENCOUNTER — Other Ambulatory Visit: Payer: Self-pay

## 2018-05-21 ENCOUNTER — Encounter (HOSPITAL_COMMUNITY): Payer: Self-pay | Admitting: Emergency Medicine

## 2018-05-21 DIAGNOSIS — M6281 Muscle weakness (generalized): Secondary | ICD-10-CM | POA: Diagnosis not present

## 2018-05-21 DIAGNOSIS — A420 Pulmonary actinomycosis: Secondary | ICD-10-CM | POA: Diagnosis present

## 2018-05-21 DIAGNOSIS — Z955 Presence of coronary angioplasty implant and graft: Secondary | ICD-10-CM

## 2018-05-21 DIAGNOSIS — J9621 Acute and chronic respiratory failure with hypoxia: Secondary | ICD-10-CM | POA: Diagnosis present

## 2018-05-21 DIAGNOSIS — J9602 Acute respiratory failure with hypercapnia: Secondary | ICD-10-CM | POA: Diagnosis not present

## 2018-05-21 DIAGNOSIS — Z79899 Other long term (current) drug therapy: Secondary | ICD-10-CM

## 2018-05-21 DIAGNOSIS — E785 Hyperlipidemia, unspecified: Secondary | ICD-10-CM | POA: Diagnosis not present

## 2018-05-21 DIAGNOSIS — I10 Essential (primary) hypertension: Secondary | ICD-10-CM | POA: Diagnosis present

## 2018-05-21 DIAGNOSIS — I480 Paroxysmal atrial fibrillation: Secondary | ICD-10-CM | POA: Diagnosis present

## 2018-05-21 DIAGNOSIS — I495 Sick sinus syndrome: Secondary | ICD-10-CM | POA: Diagnosis present

## 2018-05-21 DIAGNOSIS — I2510 Atherosclerotic heart disease of native coronary artery without angina pectoris: Secondary | ICD-10-CM | POA: Diagnosis present

## 2018-05-21 DIAGNOSIS — Z7901 Long term (current) use of anticoagulants: Secondary | ICD-10-CM | POA: Diagnosis not present

## 2018-05-21 DIAGNOSIS — Z833 Family history of diabetes mellitus: Secondary | ICD-10-CM

## 2018-05-21 DIAGNOSIS — J9601 Acute respiratory failure with hypoxia: Secondary | ICD-10-CM

## 2018-05-21 DIAGNOSIS — R2689 Other abnormalities of gait and mobility: Secondary | ICD-10-CM | POA: Diagnosis not present

## 2018-05-21 DIAGNOSIS — I452 Bifascicular block: Secondary | ICD-10-CM | POA: Diagnosis present

## 2018-05-21 DIAGNOSIS — Z950 Presence of cardiac pacemaker: Secondary | ICD-10-CM | POA: Diagnosis not present

## 2018-05-21 DIAGNOSIS — J9622 Acute and chronic respiratory failure with hypercapnia: Secondary | ICD-10-CM | POA: Diagnosis present

## 2018-05-21 DIAGNOSIS — J440 Chronic obstructive pulmonary disease with acute lower respiratory infection: Secondary | ICD-10-CM | POA: Diagnosis present

## 2018-05-21 DIAGNOSIS — I5043 Acute on chronic combined systolic (congestive) and diastolic (congestive) heart failure: Secondary | ICD-10-CM | POA: Diagnosis present

## 2018-05-21 DIAGNOSIS — J9612 Chronic respiratory failure with hypercapnia: Secondary | ICD-10-CM | POA: Diagnosis not present

## 2018-05-21 DIAGNOSIS — J9611 Chronic respiratory failure with hypoxia: Secondary | ICD-10-CM | POA: Diagnosis not present

## 2018-05-21 DIAGNOSIS — Z9049 Acquired absence of other specified parts of digestive tract: Secondary | ICD-10-CM

## 2018-05-21 DIAGNOSIS — J441 Chronic obstructive pulmonary disease with (acute) exacerbation: Secondary | ICD-10-CM | POA: Diagnosis not present

## 2018-05-21 DIAGNOSIS — I509 Heart failure, unspecified: Secondary | ICD-10-CM

## 2018-05-21 DIAGNOSIS — I4891 Unspecified atrial fibrillation: Secondary | ICD-10-CM | POA: Diagnosis present

## 2018-05-21 DIAGNOSIS — I252 Old myocardial infarction: Secondary | ICD-10-CM | POA: Diagnosis not present

## 2018-05-21 DIAGNOSIS — B957 Other staphylococcus as the cause of diseases classified elsewhere: Secondary | ICD-10-CM | POA: Diagnosis present

## 2018-05-21 DIAGNOSIS — Z452 Encounter for adjustment and management of vascular access device: Secondary | ICD-10-CM | POA: Diagnosis not present

## 2018-05-21 DIAGNOSIS — N179 Acute kidney failure, unspecified: Secondary | ICD-10-CM | POA: Diagnosis present

## 2018-05-21 DIAGNOSIS — R5383 Other fatigue: Secondary | ICD-10-CM

## 2018-05-21 DIAGNOSIS — I130 Hypertensive heart and chronic kidney disease with heart failure and stage 1 through stage 4 chronic kidney disease, or unspecified chronic kidney disease: Principal | ICD-10-CM | POA: Diagnosis present

## 2018-05-21 DIAGNOSIS — J181 Lobar pneumonia, unspecified organism: Secondary | ICD-10-CM

## 2018-05-21 DIAGNOSIS — I340 Nonrheumatic mitral (valve) insufficiency: Secondary | ICD-10-CM | POA: Diagnosis not present

## 2018-05-21 DIAGNOSIS — G9341 Metabolic encephalopathy: Secondary | ICD-10-CM

## 2018-05-21 DIAGNOSIS — R4020 Unspecified coma: Secondary | ICD-10-CM | POA: Diagnosis not present

## 2018-05-21 DIAGNOSIS — Z888 Allergy status to other drugs, medicaments and biological substances status: Secondary | ICD-10-CM

## 2018-05-21 DIAGNOSIS — N183 Chronic kidney disease, stage 3 (moderate): Secondary | ICD-10-CM | POA: Diagnosis present

## 2018-05-21 DIAGNOSIS — J984 Other disorders of lung: Secondary | ICD-10-CM | POA: Diagnosis not present

## 2018-05-21 DIAGNOSIS — E1122 Type 2 diabetes mellitus with diabetic chronic kidney disease: Secondary | ICD-10-CM | POA: Diagnosis present

## 2018-05-21 DIAGNOSIS — R0602 Shortness of breath: Secondary | ICD-10-CM

## 2018-05-21 DIAGNOSIS — Z66 Do not resuscitate: Secondary | ICD-10-CM | POA: Diagnosis not present

## 2018-05-21 DIAGNOSIS — Z9981 Dependence on supplemental oxygen: Secondary | ICD-10-CM

## 2018-05-21 DIAGNOSIS — Z87891 Personal history of nicotine dependence: Secondary | ICD-10-CM

## 2018-05-21 DIAGNOSIS — R0902 Hypoxemia: Secondary | ICD-10-CM

## 2018-05-21 DIAGNOSIS — Z8249 Family history of ischemic heart disease and other diseases of the circulatory system: Secondary | ICD-10-CM

## 2018-05-21 DIAGNOSIS — R846 Abnormal cytological findings in specimens from respiratory organs and thorax: Secondary | ICD-10-CM | POA: Diagnosis not present

## 2018-05-21 DIAGNOSIS — R4182 Altered mental status, unspecified: Secondary | ICD-10-CM | POA: Diagnosis not present

## 2018-05-21 DIAGNOSIS — J449 Chronic obstructive pulmonary disease, unspecified: Secondary | ICD-10-CM | POA: Diagnosis not present

## 2018-05-21 DIAGNOSIS — E876 Hypokalemia: Secondary | ICD-10-CM | POA: Diagnosis not present

## 2018-05-21 DIAGNOSIS — E875 Hyperkalemia: Secondary | ICD-10-CM | POA: Diagnosis present

## 2018-05-21 DIAGNOSIS — M79661 Pain in right lower leg: Secondary | ICD-10-CM | POA: Diagnosis not present

## 2018-05-21 DIAGNOSIS — Z7189 Other specified counseling: Secondary | ICD-10-CM | POA: Diagnosis not present

## 2018-05-21 DIAGNOSIS — L8990 Pressure ulcer of unspecified site, unspecified stage: Secondary | ICD-10-CM

## 2018-05-21 DIAGNOSIS — J90 Pleural effusion, not elsewhere classified: Secondary | ICD-10-CM | POA: Diagnosis not present

## 2018-05-21 DIAGNOSIS — J869 Pyothorax without fistula: Secondary | ICD-10-CM | POA: Diagnosis present

## 2018-05-21 DIAGNOSIS — Y95 Nosocomial condition: Secondary | ICD-10-CM | POA: Diagnosis present

## 2018-05-21 DIAGNOSIS — M79662 Pain in left lower leg: Secondary | ICD-10-CM | POA: Diagnosis not present

## 2018-05-21 DIAGNOSIS — M79606 Pain in leg, unspecified: Secondary | ICD-10-CM

## 2018-05-21 DIAGNOSIS — Z79890 Hormone replacement therapy (postmenopausal): Secondary | ICD-10-CM

## 2018-05-21 DIAGNOSIS — Z885 Allergy status to narcotic agent status: Secondary | ICD-10-CM

## 2018-05-21 DIAGNOSIS — Z978 Presence of other specified devices: Secondary | ICD-10-CM | POA: Diagnosis not present

## 2018-05-21 DIAGNOSIS — J811 Chronic pulmonary edema: Secondary | ICD-10-CM | POA: Diagnosis not present

## 2018-05-21 DIAGNOSIS — E1129 Type 2 diabetes mellitus with other diabetic kidney complication: Secondary | ICD-10-CM | POA: Diagnosis not present

## 2018-05-21 DIAGNOSIS — K5900 Constipation, unspecified: Secondary | ICD-10-CM | POA: Diagnosis not present

## 2018-05-21 DIAGNOSIS — R0600 Dyspnea, unspecified: Secondary | ICD-10-CM | POA: Diagnosis not present

## 2018-05-21 DIAGNOSIS — Z9889 Other specified postprocedural states: Secondary | ICD-10-CM

## 2018-05-21 DIAGNOSIS — J9811 Atelectasis: Secondary | ICD-10-CM | POA: Diagnosis not present

## 2018-05-21 DIAGNOSIS — R262 Difficulty in walking, not elsewhere classified: Secondary | ICD-10-CM | POA: Diagnosis not present

## 2018-05-21 DIAGNOSIS — I110 Hypertensive heart disease with heart failure: Secondary | ICD-10-CM | POA: Diagnosis not present

## 2018-05-21 DIAGNOSIS — M47894 Other spondylosis, thoracic region: Secondary | ICD-10-CM | POA: Diagnosis not present

## 2018-05-21 DIAGNOSIS — J189 Pneumonia, unspecified organism: Secondary | ICD-10-CM | POA: Diagnosis not present

## 2018-05-21 LAB — BASIC METABOLIC PANEL
Anion gap: 6 (ref 5–15)
BUN: 28 mg/dL — AB (ref 8–23)
CO2: 39 mmol/L — ABNORMAL HIGH (ref 22–32)
CREATININE: 0.97 mg/dL (ref 0.44–1.00)
Calcium: 9.3 mg/dL (ref 8.9–10.3)
Chloride: 94 mmol/L — ABNORMAL LOW (ref 98–111)
GFR calc non Af Amer: 52 mL/min — ABNORMAL LOW (ref >60)
Glucose, Bld: 154 mg/dL — ABNORMAL HIGH (ref 70–99)
Potassium: 5.3 mmol/L — ABNORMAL HIGH (ref 3.5–5.1)
SODIUM: 139 mmol/L (ref 135–145)

## 2018-05-21 LAB — TROPONIN I

## 2018-05-21 LAB — CBC WITH DIFFERENTIAL/PLATELET
BASOS PCT: 0 %
Basophils Absolute: 0 10*3/uL (ref 0.0–0.1)
EOS PCT: 1 %
Eosinophils Absolute: 0.1 10*3/uL (ref 0.0–0.7)
HCT: 35 % — ABNORMAL LOW (ref 36.0–46.0)
HEMOGLOBIN: 10.2 g/dL — AB (ref 12.0–15.0)
LYMPHS ABS: 1.4 10*3/uL (ref 0.7–4.0)
Lymphocytes Relative: 16 %
MCH: 28.5 pg (ref 26.0–34.0)
MCHC: 29.1 g/dL — AB (ref 30.0–36.0)
MCV: 97.8 fL (ref 78.0–100.0)
MONO ABS: 0.9 10*3/uL (ref 0.1–1.0)
MONOS PCT: 11 %
NEUTROS PCT: 72 %
Neutro Abs: 6.4 10*3/uL (ref 1.7–7.7)
Platelets: 200 10*3/uL (ref 150–400)
RBC: 3.58 MIL/uL — ABNORMAL LOW (ref 3.87–5.11)
RDW: 13.6 % (ref 11.5–15.5)
WBC: 8.8 10*3/uL (ref 4.0–10.5)

## 2018-05-21 LAB — URINALYSIS, ROUTINE W REFLEX MICROSCOPIC
Bilirubin Urine: NEGATIVE
GLUCOSE, UA: NEGATIVE mg/dL
Ketones, ur: NEGATIVE mg/dL
Nitrite: NEGATIVE
PROTEIN: 30 mg/dL — AB
Specific Gravity, Urine: 1.025 (ref 1.005–1.030)
pH: 6 (ref 5.0–8.0)

## 2018-05-21 LAB — LACTATE DEHYDROGENASE, PLEURAL OR PERITONEAL FLUID: LD, Fluid: 53 U/L — ABNORMAL HIGH (ref 3–23)

## 2018-05-21 LAB — TSH: TSH: 2.866 u[IU]/mL (ref 0.350–4.500)

## 2018-05-21 LAB — BODY FLUID CELL COUNT WITH DIFFERENTIAL
EOS FL: 0 %
Lymphs, Fluid: 87 %
Monocyte-Macrophage-Serous Fluid: 11 % — ABNORMAL LOW (ref 50–90)
NEUTROPHIL FLUID: 2 % (ref 0–25)
Total Nucleated Cell Count, Fluid: 1035 cu mm — ABNORMAL HIGH (ref 0–1000)

## 2018-05-21 LAB — PROTEIN, PLEURAL OR PERITONEAL FLUID: Total protein, fluid: <3 g/dL

## 2018-05-21 LAB — GLUCOSE, CAPILLARY: GLUCOSE-CAPILLARY: 129 mg/dL — AB (ref 70–99)

## 2018-05-21 LAB — BRAIN NATRIURETIC PEPTIDE: B NATRIURETIC PEPTIDE 5: 787 pg/mL — AB (ref 0.0–100.0)

## 2018-05-21 MED ORDER — APIXABAN 5 MG PO TABS
5.0000 mg | ORAL_TABLET | Freq: Two times a day (BID) | ORAL | Status: DC
  Administered 2018-05-21 – 2018-05-22 (×3): 5 mg via ORAL
  Filled 2018-05-21 (×3): qty 1

## 2018-05-21 MED ORDER — FUROSEMIDE 10 MG/ML IJ SOLN
40.0000 mg | Freq: Two times a day (BID) | INTRAMUSCULAR | Status: DC
  Administered 2018-05-21 – 2018-05-22 (×3): 40 mg via INTRAVENOUS
  Filled 2018-05-21 (×4): qty 4

## 2018-05-21 MED ORDER — SODIUM CHLORIDE 0.9 % IV SOLN
250.0000 mL | INTRAVENOUS | Status: DC | PRN
  Administered 2018-05-23 – 2018-05-28 (×2): 250 mL via INTRAVENOUS

## 2018-05-21 MED ORDER — ACETAMINOPHEN 325 MG PO TABS
650.0000 mg | ORAL_TABLET | ORAL | Status: DC | PRN
  Administered 2018-05-22: 650 mg via ORAL
  Filled 2018-05-21: qty 2

## 2018-05-21 MED ORDER — ESCITALOPRAM OXALATE 10 MG PO TABS
20.0000 mg | ORAL_TABLET | Freq: Every day | ORAL | Status: DC
  Administered 2018-05-21 – 2018-05-22 (×2): 20 mg via ORAL
  Filled 2018-05-21 (×2): qty 2

## 2018-05-21 MED ORDER — LORAZEPAM 0.5 MG PO TABS: 0.5000 mg | ORAL_TABLET | Freq: Every day | ORAL | Status: DC

## 2018-05-21 MED ORDER — LISINOPRIL 5 MG PO TABS
2.5000 mg | ORAL_TABLET | Freq: Every day | ORAL | Status: DC
  Administered 2018-05-22: 2.5 mg via ORAL
  Filled 2018-05-21 (×2): qty 1

## 2018-05-21 MED ORDER — VITAMIN B-12 1000 MCG PO TABS
1000.0000 ug | ORAL_TABLET | Freq: Every day | ORAL | Status: DC
  Administered 2018-05-22: 1000 ug via ORAL
  Filled 2018-05-21: qty 1

## 2018-05-21 MED ORDER — LORAZEPAM 0.5 MG PO TABS
0.5000 mg | ORAL_TABLET | Freq: Every day | ORAL | Status: DC
  Administered 2018-05-21 – 2018-05-22 (×2): 0.5 mg via ORAL
  Filled 2018-05-21 (×2): qty 1

## 2018-05-21 MED ORDER — MELATONIN 10 MG PO TABS: 1.0000 | ORAL_TABLET | Freq: Every day | ORAL | Status: DC

## 2018-05-21 MED ORDER — SODIUM CHLORIDE 0.9% FLUSH
3.0000 mL | INTRAVENOUS | Status: DC | PRN
  Administered 2018-06-04: 3 mL via INTRAVENOUS
  Filled 2018-05-21: qty 3

## 2018-05-21 MED ORDER — PANTOPRAZOLE SODIUM 40 MG PO TBEC
40.0000 mg | DELAYED_RELEASE_TABLET | Freq: Every day | ORAL | Status: DC
Start: 2018-05-22 — End: 2018-05-23
  Administered 2018-05-22: 40 mg via ORAL
  Filled 2018-05-21: qty 1

## 2018-05-21 MED ORDER — BISOPROLOL FUMARATE 5 MG PO TABS
2.5000 mg | ORAL_TABLET | Freq: Every day | ORAL | Status: DC
  Administered 2018-05-22 – 2018-06-10 (×19): 2.5 mg via ORAL
  Filled 2018-05-21 (×19): qty 1

## 2018-05-21 MED ORDER — ONDANSETRON HCL 4 MG/2ML IJ SOLN
4.0000 mg | Freq: Four times a day (QID) | INTRAMUSCULAR | Status: DC | PRN
  Administered 2018-05-22 – 2018-06-04 (×3): 4 mg via INTRAVENOUS
  Filled 2018-05-21 (×4): qty 2

## 2018-05-21 MED ORDER — FUROSEMIDE 10 MG/ML IJ SOLN
40.0000 mg | Freq: Once | INTRAMUSCULAR | Status: AC
  Administered 2018-05-21: 40 mg via INTRAVENOUS
  Filled 2018-05-21: qty 4

## 2018-05-21 MED ORDER — SODIUM CHLORIDE 0.9% FLUSH
3.0000 mL | Freq: Two times a day (BID) | INTRAVENOUS | Status: DC
  Administered 2018-05-21 – 2018-06-06 (×27): 3 mL via INTRAVENOUS

## 2018-05-21 NOTE — Procedures (Signed)
PreOperative Dx: LT pleural effusion Postoperative Dx: LT pleural effusion Procedure:   US guided LT thoracentesis Radiologist:  Tyron RussellBoles Anesthesia:  10 ml of 1% lidocaine Specimen:  300 mL of serous colored fluid EBL:   < 1 ml Complications: None

## 2018-05-21 NOTE — Progress Notes (Signed)
Thoracenteiss complete no signs of distress.

## 2018-05-21 NOTE — H&P (Signed)
History and Physical  [name] ZHY:[id number] DOB: 1931-10-18 DOA: 05/21/2018   PCP: Benita Stabile, MD   Patient coming from: Home  Chief Complaint: sob  HPI:  [name] is a 84 y.o. female with medical history of COPD, systolic and diastolic CHF, hypertension, paroxysmal atrial for ablation, sinus node dysfunction, diabetes mellitus, coronary artery disease presenting with 1 month history of shortness of breath that has significantly worsened over the past 2 days.  The patient visited the emergency department on 05/01/2018.  She was discharged from the emergency department with prednisone for COPD exacerbation.  There was no improvement with prednisone.  She will follow-up with her primary care provider.  She was given a diagnosis of pneumonia and started on antibiotics which she has completed at least 1 week prior to this admission.  Unfortunately, the patient shortness of breath continued to worsen.  Primarily, her shortness of breath is with exertion, but it has worsened to the point where she is having shortness of breath at rest.  She denies any fevers, chills, chest pain, nausea, vomiting, diarrhea, abdominal pain, dysuria, hematuria.  She has a nonproductive cough.  The patient endorses compliance with all her medications.  The patient states that she has had some increase in her lower extremity edema as well as increasing abdominal girth.  She also notes some PND type symptoms at nighttime.  In the emergency department, the patient was afebrile hemodynamically stable saturating 100% on 2.5 L.  BNP was 787.  Potassium is 5.3.  Chest x-ray showed a left pleural effusion which is slightly worse.  The patient was given furosemide 40 mg IV x1.  CBC was essentially unremarkable with hemoglobin near baseline.  EKG showed atrial fibrillation with right bundle branch block.  Assessment/Plan: Acute on chronic systolic diastolic CHF -daily weights -accurate I/O's -fluid  restrict -Echo -continue IV Lasix  Chronic respiratory failure with hypoxia -chronically on 2.5lL  Paroxysmal atrial fibrillation -Continue apixaban -Continue bisoprolol  COPD -Stable presently without exacerbation  Diabetes mellitus type 2 -Appears to be diet-controlled in the outpatient setting -Hemoglobin A1c -NovoLog sliding scale  Sinus node dysfunction -Status post PPM  Coronary artery disease -No chest pain presently -Continue bisoprolol  Hyperkalemia -place on tele -expect improvement with diuresis IV -A.m. BMP        Past Medical History:  Diagnosis Date  . Asthma   . COPD (chronic obstructive pulmonary disease) (HCC)    Oxygen dependent  . Coronary atherosclerosis of native coronary artery   . Essential hypertension, benign   . History of GI bleed    Ulcer  . Hyperlipidemia   . Old inferior wall myocardial infarction 1999   [facility]  . PAF (paroxysmal atrial fibrillation) (HCC)   . Sinus node dysfunction (HCC)    Medtronic PPM  . Type 2 diabetes mellitus (HCC)    Past Surgical History:  Procedure Laterality Date  . Cesareen section    . CHOLECYSTECTOMY    . COLONOSCOPY  09/2004   RMR: For obscure GI bleed. Left-sided diverticula, otherwise normal ileocolonoscopy  . Coronary artery stent  1999   BMS x 3 RCA  ([facility]), PTCRA RC4  . EP IMPLANTABLE DEVICE N/A 06/26/2016   Procedure: Pacemaker Implant;  Surgeon: Will Jorja Loa, MD;  Location: [facility] INVASIVE CV LAB;  Service: Cardiovascular;  Laterality: N/A;  . ESOPHAGOGASTRODUODENOSCOPY  11 2005   RMR: For melena, normal exam.  . EXPLORATORY LAPAROTOMY    . GIVENS CAPSULE  STUDY  08/2004   official report unavailable, but reported couple of AVMs, nonbleeding  . PARTIAL COLECTOMY  1977   blockage/gangrene? unclear if small bowel or colon  . THORACENTESIS Left 03/10/16   1 L transudative fluid   Social History:  reports that she quit smoking about 20 years ago. Her smoking use included cigarettes.  She has a 80.00 pack-year smoking history. She has never used smokeless tobacco. She reports that she does not drink alcohol or use drugs.   Family History  Problem Relation Age of Onset  . Diabetes Mother   . Diabetes Father   . Heart disease Father   . Cancer Sister        unknown  . Cancer Brother        unknown  . Diabetes Brother   . Colon cancer Neg Hx      Allergies  Allergen Reactions  . Codeine Nausea Only  . Heparin Other (See Comments)    Causes internal bleeding      Prior to Admission medications   Medication Sig Start Date End Date Taking? Authorizing Provider  nitroGLYCERIN (NITROSTAT) 0.4 MG SL tablet Place 1 tablet (0.4 mg total) under the tongue every 5 (five) minutes as needed for chest pain. 04/24/14  Yes Jonelle Sidle, MD  apixaban (ELIQUIS) 5 MG TABS tablet Take 1 tablet (5 mg total) by mouth 2 (two) times daily. 03/20/16   Erick Blinks, MD  bisoprolol (ZEBETA) 5 MG tablet Take 1 tablet (5 mg total) by mouth daily. Pt needs appointment for further refills. Patient taking differently: Take 2.5 mg by mouth daily.  07/16/16   Nyoka Cowden, MD  escitalopram (LEXAPRO) 20 MG tablet Take 20 mg by mouth at bedtime.     [provider]  furosemide (LASIX) 40 MG tablet Take 40 mg by mouth daily. Daily at 0800 am    [provider]  ipratropium-albuterol (DUONEB) 0.5-2.5 (3) MG/3ML SOLN Take 3 mLs by nebulization 3 (three) times daily. Patient taking differently: Take 3 mLs by nebulization every 6 (six) hours as needed (for shortness of breath).  03/20/16   Erick Blinks, MD  LORazepam (ATIVAN) 0.5 MG tablet Take 1 tablet (0.5 mg total) by mouth daily. Take one tablet by mouth once daily Patient taking differently: Take 0.5 mg by mouth at bedtime. Take one tablet by mouth once daily 04/15/16   Sharon Seller, NP  Melatonin 10 MG TABS Take 1 tablet by mouth at bedtime.    [provider]  Multiple Vitamins-Minerals (CENTRUM SILVER  50+WOMEN PO) Take 1 tablet by mouth daily.    [provider]  omeprazole (PRILOSEC) 20 MG capsule Take 1 capsule (20 mg total) by mouth 2 (two) times daily before a meal. 06/12/16   Nyoka Cowden, MD  OXYGEN Inhale 2.5 L into the lungs continuous.    [provider]  potassium chloride (K-DUR,KLOR-CON) 10 MEQ tablet Take 10 mEq by mouth 2 (two) times daily.    [provider]  predniSONE (DELTASONE) 5 MG tablet Take 1 tablet (5 mg total) by mouth daily with breakfast. 3 tablets for 3 days, 2 tablets for 3 days, 1 tablet for 3 days. 05/01/18   Donnetta Hutching, MD  vitamin B-12 (CYANOCOBALAMIN) 1000 MCG tablet Take 1,000 mcg by mouth daily.    [provider]    Review of Systems:  Constitutional:  No weight loss, night sweats, Fevers, chills, fatigue.  Head&Eyes: No headache.  No vision loss.  No eye pain or scotoma ENT:  No Difficulty swallowing,Tooth/dental problems,Sore throat,  No ear ache, post nasal drip,  Cardio-vascular:  No chest pain, Orthopnea, PND, swelling in lower extremities,  dizziness, palpitations  GI:  No  abdominal pain, nausea, vomiting, diarrhea, loss of appetite, hematochezia, melena, heartburn, indigestion, Resp:   No coughing up of blood .No wheezing.No chest wall deformity  Skin:  no rash or lesions.  GU:  no dysuria, change in color of urine, no urgency or frequency. No flank pain.  Musculoskeletal:  No joint pain or swelling. No decreased range of motion. No back pain.  Psych:  No change in mood or affect. No depression or anxiety. Neurologic: No headache, no dysesthesia, no focal weakness, no vision loss. No syncope  Physical Exam: Vitals:   05/21/18 1126 05/21/18 1145 05/21/18 1200 05/21/18 1240  BP: (!) 127/98  (!) 108/54   Pulse: (!) 113     Resp: (!) 27 (!) 25 20 (!) 21  Temp: (!) 97.4 F (36.3 C)     TempSrc: Oral     SpO2: 97% 100%  99%  Weight: 69.9 kg (154 lb)     Height: 5\' 4"  (1.626 m)      General:   A&O x 3, NAD, nontoxic, pleasant/cooperative Head/Eye: No conjunctival hemorrhage, no icterus, [place]/AT, No nystagmus ENT:  No icterus,  No thrush, good dentition, no pharyngeal exudate Neck:  No masses, no lymphadenpathy, no bruits CV:  RRR, no rub, no gallop, no S3+JVD Lung:  Bibasilar crackles, no wheeze.  Diminished BS on left base Abdomen: soft/NT, +BS, nondistended, no peritoneal signs Ext: No cyanosis, No rashes, No petechiae, No lymphangitis, No edema Neuro: CNII-XII intact, strength 4/5 in bilateral upper and lower extremities, no dysmetria  Labs on Admission:  Basic Metabolic Panel: Recent Labs  Lab 05/21/18 1129  NA 139  K 5.3*  CL 94*  CO2 39*  GLUCOSE 154*  BUN 28*  CREATININE 0.97  CALCIUM 9.3   Liver Function Tests: No results for input(s): AST, ALT, ALKPHOS, BILITOT, PROT, ALBUMIN in the last 168 hours. No results for input(s): LIPASE, AMYLASE in the last 168 hours. No results for input(s): AMMONIA in the last 168 hours. CBC: Recent Labs  Lab 05/21/18 1129  WBC 8.8  NEUTROABS 6.4  HGB 10.2*  HCT 35.0*  MCV 97.8  PLT 200   Coagulation Profile: No results for input(s): INR, PROTIME in the last 168 hours. Cardiac Enzymes: Recent Labs  Lab 05/21/18 1129  TROPONINI <0.03   BNP: Invalid input(s): POCBNP CBG: No results for input(s): GLUCAP in the last 168 hours. Urine analysis:    Component Value Date/Time   COLORURINE AMBER (A) 05/21/2018 1222   APPEARANCEUR CLOUDY (A) 05/21/2018 1222   LABSPEC 1.025 05/21/2018 1222   PHURINE 6.0 05/21/2018 1222   GLUCOSEU NEGATIVE 05/21/2018 1222   HGBUR LARGE (A) 05/21/2018 1222   BILIRUBINUR NEGATIVE 05/21/2018 1222   KETONESUR NEGATIVE 05/21/2018 1222   PROTEINUR 30 (A) 05/21/2018 1222   NITRITE NEGATIVE 05/21/2018 1222   LEUKOCYTESUR SMALL (A) 05/21/2018 1222   Sepsis Labs: @LABRCNTIP (procalcitonin:4,lacticidven:4) )No results found for this or any previous visit (from the past 240 hour(s)).    Radiological Exams on Admission: Dg Chest 1 View  Result Date: 05/21/2018 CLINICAL DATA:  Lateral radiograph requested.  Query pneumonia. EXAM: CHEST  1 VIEW COMPARISON:  AP radiograph done earlier. : The heart remains enlarged. Retrocardiac density likely effusion and atelectasis, no definite consolidation, is increased from 05/10/2018. IMPRESSION: Worsening  aeration. Electronically Signed   By: Elsie Stain M.D.   On: 05/21/2018 12:48   Dg Chest Portable 1 View  Result Date: 05/21/2018 CLINICAL DATA:  Short of breath and weakness. EXAM: PORTABLE CHEST 1 VIEW COMPARISON:  05/10/2018 FINDINGS: Cardiac enlargement with dual lead pacemaker. Negative for heart failure. Mild left lower lobe airspace disease and probable small left effusion unchanged from the prior study. Right lung remains clear. IMPRESSION: Mild left lower lobe airspace disease and probable left effusion unchanged. This area is difficult to evaluate on portable image given the large cardiac silhouette. Electronically Signed   By: Marlan Palau M.D.   On: 05/21/2018 11:45    EKG: Independently reviewed.AFib. RBBB    Time spent:60 minutes Code Status:   FULL Family Communication:  Cousin updated at bedside 8/2 Disposition Plan: expect 2-3 day hospitalization Consults called: none  DVT Prophylaxis: apixaban  Catarina Hartshorn, DO  [facility] Pager [phone]  If 7PM-7AM, please contact night-coverage [email or web address] Password [email or web address] 05/21/2018, 2:05 PM

## 2018-05-21 NOTE — ED Triage Notes (Signed)
Dx with pneumonia 2 weeks ago, finished antibiotic and prednisone, pt still feeling weak, sob, causing her to be anxious. She took ativan this morning. Pt on 3L 02

## 2018-05-21 NOTE — ED Provider Notes (Signed)
[facility] Emergency Department Provider Note MRN:  [id number]  Arrival date & time: 05/21/18     Chief Complaint   Pneumonia   History of Present Illness   [name] is a 84 y.o. year-old female with a history of COPD, CHF, A. fib presenting to the ED with chief complaint of fatigue.  Patient has been experiencing generalized fatigue and weakness for over a month.  Has been seen by multiple doctors, with the leading theory that she is experiencing a COPD exacerbation or pneumonia.  Has completed a steroid taper as well as a course of antibiotics with no improvement.  Feeling even more rundown over the past 2 days.  Denies fever, no cough, no chest pain, no abdominal pain, no dysuria, no rash.  Lives alone, denies falls.  Endorsing moderate shortness of breath.  Review of Systems  A complete 10 system review of systems was obtained and all systems are negative except as noted in the HPI and PMH.   Patient's Health History    Past Medical History:  Diagnosis Date  . Asthma   . COPD (chronic obstructive pulmonary disease) (HCC)    Oxygen dependent  . Coronary atherosclerosis of native coronary artery   . Essential hypertension, benign   . History of GI bleed    Ulcer  . Hyperlipidemia   . Old inferior wall myocardial infarction 1999   [facility]  . PAF (paroxysmal atrial fibrillation) (HCC)   . Sinus node dysfunction (HCC)    [employer] PPM  . Type 2 diabetes mellitus (HCC)     Past Surgical History:  Procedure Laterality Date  . Cesareen section    . CHOLECYSTECTOMY    . COLONOSCOPY  09/2004   RMR: For obscure GI bleed. Left-sided diverticula, otherwise normal ileocolonoscopy  . Coronary artery stent  1999   BMS x 3 RCA  ([facility]), PTCRA RC4  . EP IMPLANTABLE DEVICE N/A 06/26/2016   Procedure: Pacemaker Implant;  Surgeon: Will Jorja Loa, MD;  Location: [facility] INVASIVE CV LAB;  Service: Cardiovascular;  Laterality: N/A;  . ESOPHAGOGASTRODUODENOSCOPY  11  2005   RMR: For melena, normal exam.  . EXPLORATORY LAPAROTOMY    . GIVENS CAPSULE STUDY  08/2004   official report unavailable, but reported couple of AVMs, nonbleeding  . PARTIAL COLECTOMY  1977   blockage/gangrene? unclear if small bowel or colon  . THORACENTESIS Left 03/10/16   1 L transudative fluid    Family History  Problem Relation Age of Onset  . Diabetes Mother   . Diabetes Father   . Heart disease Father   . Cancer Sister        unknown  . Cancer Brother        unknown  . Diabetes Brother   . Colon cancer Neg Hx     Social History   Socioeconomic History  . Marital status: Widowed    Spouse name: Not on file  . Number of children: 2  . Years of education: Not on file  . Highest education level: Not on file  Occupational History  . Not on file  Social Needs  . Financial resource strain: Not on file  . Food insecurity:    Worry: Not on file    Inability: Not on file  . Transportation needs:    Medical: Not on file    Non-medical: Not on file  Tobacco Use  . Smoking status: Former Smoker    Packs/day: 2.00    Years: 40.00  Pack years: 80.00    Types: Cigarettes    Last attempt to quit: 01/13/1998    Years since quitting: 20.3  . Smokeless tobacco: Never Used  Substance and Sexual Activity  . Alcohol use: No    Alcohol/week: 0.0 oz  . Drug use: No  . Sexual activity: Not on file  Lifestyle  . Physical activity:    Days per week: Not on file    Minutes per session: Not on file  . Stress: Not on file  Relationships  . Social connections:    Talks on phone: Not on file    Gets together: Not on file    Attends religious service: Not on file    Active member of club or organization: Not on file    Attends meetings of clubs or organizations: Not on file    Relationship status: Not on file  . Intimate partner violence:    Fear of current or ex partner: Not on file    Emotionally abused: Not on file    Physically abused: Not on file    Forced  sexual activity: Not on file  Other Topics Concern  . Not on file  Social History Narrative  . Not on file     Physical Exam  Vital Signs and Nursing Notes reviewed Vitals:   05/21/18 1500 05/21/18 1512  BP: (!) 91/53 (!) 96/52  Pulse:    Resp: (!) 28   Temp:    SpO2: 99%     CONSTITUTIONAL: Tired-appearing, NAD NEURO:  Alert and oriented x 3, no focal deficits EYES:  eyes equal and reactive ENT/NECK:  no LAD, no JVD CARDIO: Tachycardic rate, well-perfused, normal S1 and S2 PULM: Decreased breath sounds bilateral bases; tachypnea GI/GU:  normal bowel sounds, non-distended, non-tender MSK/SPINE:  No gross deformities, no edema SKIN:  no rash, atraumatic PSYCH:  Appropriate speech and behavior  Diagnostic and Interventional Summary    EKG Interpretation  Date/Time:  Friday May 21 2018 11:24:20 EDT Ventricular Rate:  107 PR Interval:    QRS Duration: 124 QT Interval:  369 QTC Calculation: 493 R Axis:   102 Text Interpretation:  Atrial fibrillation RBBB and LPFB Nonspecific T abnormalities, lateral leads Baseline wander in multiple leads Confirmed by Raeford RazorKohut, Stephen [phone]([id number]) on 05/21/2018 11:41:11 AM      Labs Reviewed  CBC WITH DIFFERENTIAL/PLATELET - Abnormal; Notable for the following components:      Result Value   RBC 3.58 (*)    Hemoglobin 10.2 (*)    HCT 35.0 (*)    MCHC 29.1 (*)    All other components within normal limits  BASIC METABOLIC PANEL - Abnormal; Notable for the following components:   Potassium 5.3 (*)    Chloride 94 (*)    CO2 39 (*)    Glucose, Bld 154 (*)    BUN 28 (*)    GFR calc non Af Amer 52 (*)    All other components within normal limits  BRAIN NATRIURETIC PEPTIDE - Abnormal; Notable for the following components:   B Natriuretic Peptide 787.0 (*)    All other components within normal limits  URINALYSIS, ROUTINE W REFLEX MICROSCOPIC - Abnormal; Notable for the following components:   Color, Urine AMBER (*)    APPearance CLOUDY (*)     Hgb urine dipstick LARGE (*)    Protein, ur 30 (*)    Leukocytes, UA SMALL (*)    Bacteria, UA RARE (*)    All other components within normal  limits  CULTURE, BODY FLUID-BOTTLE  TROPONIN I  TSH  LACTATE DEHYDROGENASE, PLEURAL OR PERITONEAL FLUID  BODY FLUID CELL COUNT WITH DIFFERENTIAL  PROTEIN, PLEURAL OR PERITONEAL FLUID  CYTOLOGY - NON PAP    DG Chest 1 View  Final Result    DG Chest Portable 1 View  Final Result    US THORACENTESIS ASP PLEURAL SPACE W/IMG GUIDE    (Results Pending)  DG Chest 1 View    (Results Pending)    Medications  furosemide (LASIX) injection 40 mg (40 mg Intravenous Given 05/21/18 1327)     Procedures Critical Care  ED Course and Medical Decision Making  I have reviewed the triage vital signs and the nursing notes.  Pertinent labs & imaging results that were available during my care of the patient were reviewed by me and considered in my medical decision making (see below for details). Clinical Course as of May 22 1535  Fri May 21, 2018  2763 84 year old female here with generalized fatigue, shortness of breath.  Recent outpatient management of COPD and/or pneumonia with no improvement on antibiotics and steroids.  Tachycardic on arrival, tachypneic, poor air movement bases of the lungs.  Endorsing a significant functional decline, lives alone, no falls but does not feel very safe at home.  Labs and chest x-ray pending, anticipating admission.   [MB]    Clinical Course User Index [MB] Sabas Sous, MD    Work-up suggests CHF exacerbation as the cause of patient's shortness of breath.  Admitted to hospital service for further evaluation and care.  Elmer Sow. Pilar Plate, MD [facility] Emergency Medicine [facility] [email or web address]  Final Clinical Impressions(s) / ED Diagnoses     ICD-10-CM   1. Acute on chronic congestive heart failure, unspecified heart failure type (HCC) I50.9   2. Pleural effusion on left J90 US  THORACENTESIS ASP PLEURAL SPACE W/IMG GUIDE    US THORACENTESIS ASP PLEURAL SPACE W/IMG GUIDE  3. Status post thoracentesis Z98.890 DG Chest 1 View    DG Chest 1 View  4. SOB (shortness of breath) R06.02   5. Fatigue, unspecified type R53.83     ED Discharge Orders    None         Sabas Sous, MD 05/21/18 1536

## 2018-05-22 ENCOUNTER — Inpatient Hospital Stay (HOSPITAL_COMMUNITY): Payer: Medicare Other

## 2018-05-22 DIAGNOSIS — I340 Nonrheumatic mitral (valve) insufficiency: Secondary | ICD-10-CM

## 2018-05-22 DIAGNOSIS — J9612 Chronic respiratory failure with hypercapnia: Secondary | ICD-10-CM

## 2018-05-22 LAB — ECHOCARDIOGRAM COMPLETE
HEIGHTINCHES: 64 in
WEIGHTICAEL: 2423.3 [oz_av]

## 2018-05-22 LAB — HEMOGLOBIN A1C
HEMOGLOBIN A1C: 6 % — AB (ref 4.8–5.6)
MEAN PLASMA GLUCOSE: 125.5 mg/dL

## 2018-05-22 LAB — BASIC METABOLIC PANEL
Anion gap: 6 (ref 5–15)
BUN: 25 mg/dL — ABNORMAL HIGH (ref 8–23)
CALCIUM: 8.9 mg/dL (ref 8.9–10.3)
CO2: 43 mmol/L — ABNORMAL HIGH (ref 22–32)
CREATININE: 0.93 mg/dL (ref 0.44–1.00)
Chloride: 93 mmol/L — ABNORMAL LOW (ref 98–111)
GFR, EST NON AFRICAN AMERICAN: 54 mL/min — AB (ref >60)
Glucose, Bld: 143 mg/dL — ABNORMAL HIGH (ref 70–99)
Potassium: 4.8 mmol/L (ref 3.5–5.1)
SODIUM: 142 mmol/L (ref 135–145)

## 2018-05-22 NOTE — Progress Notes (Signed)
*  PRELIMINARY RESULTS* Echocardiogram 2D Echocardiogram has been performed.  Stacey DrainWhite, Zyan Mirkin J 05/22/2018, 1:56 PM

## 2018-05-22 NOTE — Progress Notes (Addendum)
PROGRESS NOTE  [name] ZOX:[id number] DOB: May 13, 1931 DOA: 05/21/2018 PCP: Benita Stabile, MD  Brief History:  84 y.o. female with medical history of COPD, systolic and diastolic CHF, hypertension, paroxysmal atrial for ablation, sinus node dysfunction, diabetes mellitus, coronary artery disease presenting with 1 month history of shortness of breath that has significantly worsened over the past 2 days.  The patient visited the emergency department on 05/01/2018.  She was discharged from the emergency department with prednisone for COPD exacerbation.  There was no improvement with prednisone.  She will follow-up with her primary care provider.  She was given a diagnosis of pneumonia and started on antibiotics which she has completed at least 1 week prior to this admission.  Unfortunately, the patient shortness of breath continued to worsen. The patient endorses compliance with all her medications.  The patient states that she has had some increase in her lower extremity edema as well as increasing abdominal girth.  She also notes some PND type symptoms at nighttime.  In the emergency department, the patient was afebrile hemodynamically stable saturating 100% on 2.5 L.  BNP was 787.  Potassium is 5.3.  Chest x-ray showed a left pleural effusion which is slightly worse.  She was started on IV lasix with clinical improvement.   Assessment/Plan: Acute on chronic systolic and diastolic CHF -daily weights -accurate I/O's -fluid restrict -Echo -continue IV Lasix  Left Pleural effusion -05/21/18--thoracocentesis--300 cc removed -f/u Lights' criteria  Chronic respiratory failure with hypoxia -chronically on 2L  Paroxysmal atrial fibrillation -Continue apixaban -Continue bisoprolol  COPD -Stable presently without exacerbation  Diabetes mellitus type 2 -Appears to be diet-controlled in the outpatient setting -Hemoglobin A1c--6.0 -NovoLog sliding scale  Sinus node  dysfunction -Status post PPM  Coronary artery disease -No chest pain presently -Continue bisoprolol  Hyperkalemia -place on tele -improvement with diuresis IV -A.m. BMP    Disposition Plan:   Home in 1-2 days  Family Communication:   Family at bedside  Consultants:  none  Code Status:  FULL  DVT Prophylaxis:  apixaban   Procedures: As Listed in Progress Note Above  Antibiotics: None    Subjective: Pt states she is breathing better.  She just feels tired.  She denies cp, n/v/d, abdominal pain, dysuria, hematuria  Objective: Vitals:   05/21/18 1604 05/21/18 2133 05/22/18 0545 05/22/18 1333  BP: 115/63 99/67 110/66 99/60  Pulse: 92 95 91 99  Resp: 20 20 20 18   Temp: 98.2 F (36.8 C) 98.1 F (36.7 C) 98.6 F (37 C) 98.2 F (36.8 C)  TempSrc: Oral Oral Oral Oral  SpO2: 100% 100% 98% 97%  Weight:   68.7 kg (151 lb 7.3 oz)   Height:        Intake/Output Summary (Last 24 hours) at 05/22/2018 1524 Last data filed at 05/22/2018 1200 Gross per 24 hour  Intake 240 ml  Output 1850 ml  Net -1610 ml   Weight change:  Exam:   General:  Pt is alert, follows commands appropriately, not in acute distress  HEENT: No icterus, No thrush, No neck mass, [place]/AT  Cardiovascular: IRRR, S1/S2, no rubs, no gallops  Respiratory: bibasilar crackles, no wheeze  Abdomen: Soft/+BS, non tender, non distended, no guarding  Extremities: 1 + LE edema, No lymphangitis, No petechiae, No rashes, no synovitis   Data Reviewed: I have personally reviewed following labs and imaging studies Basic Metabolic Panel: Recent Labs  Lab 05/21/18 1129 05/22/18 8119  NA 139 142  K 5.3* 4.8  CL 94* 93*  CO2 39* 43*  GLUCOSE 154* 143*  BUN 28* 25*  CREATININE 0.97 0.93  CALCIUM 9.3 8.9   Liver Function Tests: No results for input(s): AST, ALT, ALKPHOS, BILITOT, PROT, ALBUMIN in the last 168 hours. No results for input(s): LIPASE, AMYLASE in the last 168 hours. No results for  input(s): AMMONIA in the last 168 hours. Coagulation Profile: No results for input(s): INR, PROTIME in the last 168 hours. CBC: Recent Labs  Lab 05/21/18 1129  WBC 8.8  NEUTROABS 6.4  HGB 10.2*  HCT 35.0*  MCV 97.8  PLT 200   Cardiac Enzymes: Recent Labs  Lab 05/21/18 1129  TROPONINI <0.03   BNP: Invalid input(s): POCBNP CBG: Recent Labs  Lab 05/21/18 1614  GLUCAP 129*   HbA1C: Recent Labs    05/22/18 0603  HGBA1C 6.0*   Urine analysis:    Component Value Date/Time   COLORURINE AMBER (A) 05/21/2018 1222   APPEARANCEUR CLOUDY (A) 05/21/2018 1222   LABSPEC 1.025 05/21/2018 1222   PHURINE 6.0 05/21/2018 1222   GLUCOSEU NEGATIVE 05/21/2018 1222   HGBUR LARGE (A) 05/21/2018 1222   BILIRUBINUR NEGATIVE 05/21/2018 1222   KETONESUR NEGATIVE 05/21/2018 1222   PROTEINUR 30 (A) 05/21/2018 1222   NITRITE NEGATIVE 05/21/2018 1222   LEUKOCYTESUR SMALL (A) 05/21/2018 1222   Sepsis Labs: @LABRCNTIP (procalcitonin:4,lacticidven:4) ) Recent Results (from the past 240 hour(s))  Culture, body fluid-bottle     Status: None (Preliminary result)   Collection Time: 05/21/18  3:05 PM  Result Value Ref Range Status   Specimen Description THORACENTESIS  Final   Special Requests NONE  Final   Culture   Final    NO GROWTH < 24 HOURS Performed at [facility], [address] [place], [place] [address]    Report Status PENDING  Incomplete     Scheduled Meds: . apixaban  5 mg Oral BID  . bisoprolol  2.5 mg Oral Daily  . escitalopram  20 mg Oral QHS  . furosemide  40 mg Intravenous BID  . lisinopril  2.5 mg Oral Daily  . LORazepam  0.5 mg Oral QHS  . pantoprazole  40 mg Oral Daily  . sodium chloride flush  3 mL Intravenous Q12H  . vitamin B-12  1,000 mcg Oral Daily   Continuous Infusions: . sodium chloride      Procedures/Studies: Dg Chest 1 View  Result Date: 05/21/2018 CLINICAL DATA:  LEFT pleural effusion post thoracentesis EXAM: CHEST  1 VIEW COMPARISON:  Earlier  exam of 05/21/2018 FINDINGS: Stable LEFT subclavian pacemaker and cardiac enlargement. Atherosclerotic calcification aorta. Persistent LEFT basilar opacity by atelectasis and effusion. No pneumothorax following thoracentesis. Remaining lungs clear. Bones demineralized. IMPRESSION: No pneumothorax following LEFT thoracentesis. Electronically Signed   By: Ulyses Southward M.D.   On: 05/21/2018 15:59   Dg Chest 1 View  Result Date: 05/21/2018 CLINICAL DATA:  Lateral radiograph requested.  Query pneumonia. EXAM: CHEST  1 VIEW COMPARISON:  AP radiograph done earlier. : The heart remains enlarged. Retrocardiac density likely effusion and atelectasis, no definite consolidation, is increased from 05/10/2018. IMPRESSION: Worsening aeration. Electronically Signed   By: Elsie Stain M.D.   On: 05/21/2018 12:48   Dg Chest 2 View  Result Date: 05/10/2018 CLINICAL DATA:  One-week history of progressive shortness of breath. Former smoker with 80 pack-year history. Current history of COPD, hypertension, diabetes and COPD. EXAM: CHEST - 2 VIEW COMPARISON:  05/01/2018, 06/27/2016 and earlier. FINDINGS:  Cardiac silhouette moderately enlarged, unchanged dating back to 2011. LEFT subclavian dual lead transvenous pacemaker unchanged and intact. Thoracic aorta atherosclerotic, unchanged. Hilar and mediastinal contours otherwise unremarkable. Pulmonary venous hypertension without overt edema. Hyperinflation, mildly prominent bronchovascular markings and mild central peribronchial thickening, unchanged. Small LEFT pleural effusion, unchanged since the most recent prior examination 9 days ago. No new pulmonary parenchymal abnormalities. Degenerative changes and DISH involving the thoracic spine. Severe degenerative changes involving the LEFT shoulder joint with associated calcified loose bodies. IMPRESSION: 1. Small LEFT pleural effusion, unchanged since the chest x-ray 9 days ago. No acute cardiopulmonary disease otherwise. 2. Stable  cardiomegaly without evidence of pulmonary edema. 3.  Emphysema (ICD10-J43.9). Electronically Signed   By: Hulan Saashomas  Lawrence M.D.   On: 05/10/2018 13:01   [place] Venous Img Lower Bilateral  Result Date: 05/22/2018 CLINICAL DATA:  84 year old female with bilateral lower extremity pain and shortness of breath EXAM: BILATERAL LOWER EXTREMITY VENOUS DOPPLER ULTRASOUND TECHNIQUE: Gray-scale sonography with graded compression, as well as color Doppler and duplex ultrasound were performed to evaluate the lower extremity deep venous systems from the level of the common femoral vein and including the common femoral, femoral, profunda femoral, popliteal and calf veins including the posterior tibial, peroneal and gastrocnemius veins when visible. The superficial great saphenous vein was also interrogated. Spectral Doppler was utilized to evaluate flow at rest and with distal augmentation maneuvers in the common femoral, femoral and popliteal veins. COMPARISON:  None. FINDINGS: RIGHT LOWER EXTREMITY Common Femoral Vein: No evidence of thrombus. Normal compressibility, respiratory phasicity and response to augmentation. Saphenofemoral Junction: No evidence of thrombus. Normal compressibility and flow on color Doppler imaging. Profunda Femoral Vein: No evidence of thrombus. Normal compressibility and flow on color Doppler imaging. Femoral Vein: No evidence of thrombus. Normal compressibility, respiratory phasicity and response to augmentation. Popliteal Vein: No evidence of thrombus. Normal compressibility, respiratory phasicity and response to augmentation. Calf Veins: No evidence of thrombus. Normal compressibility and flow on color Doppler imaging. Superficial Great Saphenous Vein: No evidence of thrombus. Normal compressibility. Venous Reflux:  None. Other Findings:  None. LEFT LOWER EXTREMITY Common Femoral Vein: No evidence of thrombus. Normal compressibility, respiratory phasicity and response to augmentation. Saphenofemoral  Junction: No evidence of thrombus. Normal compressibility and flow on color Doppler imaging. Profunda Femoral Vein: No evidence of thrombus. Normal compressibility and flow on color Doppler imaging. Femoral Vein: No evidence of thrombus. Normal compressibility, respiratory phasicity and response to augmentation. Popliteal Vein: No evidence of thrombus. Normal compressibility, respiratory phasicity and response to augmentation. Calf Veins: No evidence of thrombus. Normal compressibility and flow on color Doppler imaging. Superficial Great Saphenous Vein: No evidence of thrombus. Normal compressibility. Venous Reflux:  None. Other Findings:  None. IMPRESSION: 1. No evidence of deep venous thrombosis in either lower extremity. 2. Increased pulsatility of the venous waveforms bilaterally. Similar findings can be seen in the setting of elevated right heart pressures. Common differential considerations include tricuspid regurgitation, right-sided heart failure, pulmonary arterial hypertension and long-standing COPD. Electronically Signed   By: Malachy MoanHeath  McCullough M.D.   On: 05/22/2018 12:45   Dg Chest Portable 1 View  Result Date: 05/21/2018 CLINICAL DATA:  Short of breath and weakness. EXAM: PORTABLE CHEST 1 VIEW COMPARISON:  05/10/2018 FINDINGS: Cardiac enlargement with dual lead pacemaker. Negative for heart failure. Mild left lower lobe airspace disease and probable small left effusion unchanged from the prior study. Right lung remains clear. IMPRESSION: Mild left lower lobe airspace disease and probable left effusion unchanged. This area  is difficult to evaluate on portable image given the large cardiac silhouette. Electronically Signed   By: Marlan Palau M.D.   On: 05/21/2018 11:45   Dg Chest Port 1 View  Result Date: 05/01/2018 CLINICAL DATA:  Shortness of breath and chest pain EXAM: PORTABLE CHEST 1 VIEW COMPARISON:  Chest radiograph 06/27/2016 FINDINGS: There is a small left pleural effusion with  associated atelectasis. Cardiomegaly is unchanged. Left chest wall pacemaker with leads in expected position. No pulmonary edema or focal consolidation. IMPRESSION: Small no pulmonary edema. Left pleural effusion with associated basilar atelectasis. Electronically Signed   By: Deatra Robinson M.D.   On: 05/01/2018 06:42   US Thoracentesis Asp Pleural Space W/img Guide  Result Date: 05/21/2018 INDICATION: LEFT pleural effusion EXAM: ULTRASOUND GUIDED DIAGNOSTIC LEFT THORACENTESIS MEDICATIONS: None. COMPLICATIONS: None immediate. PROCEDURE: Procedure, benefits, and risks of procedure were discussed with patient. Written informed consent for procedure was obtained. Time out protocol followed. Pleural effusion localized by ultrasound at the posterior LEFT hemithorax. Skin prepped and draped in usual sterile fashion. Skin and soft tissues anesthetized with 10 ML of 1% lidocaine. 8 French thoracentesis catheter placed into the LEFT pleural space. 300 mL of clear serous fluid aspirated by syringe pump. Procedure tolerated well by patient without immediate complication. FINDINGS: A total of approximately 300 mL of LEFT pleural fluid was removed. Samples were sent to the laboratory as requested by the clinical team. IMPRESSION: Successful ultrasound guided LEFT thoracentesis yielding 300 mL of pleural fluid. Electronically Signed   By: Ulyses Southward M.D.   On: 05/21/2018 15:57    Catarina Hartshorn, DO  [facility] Pager [phone]  If 7PM-7AM, please contact night-coverage [email or web address] Password [email or web address] 05/22/2018, 3:24 PM   LOS: 1 day

## 2018-05-23 ENCOUNTER — Inpatient Hospital Stay (HOSPITAL_COMMUNITY): Payer: Medicare Other

## 2018-05-23 DIAGNOSIS — J181 Lobar pneumonia, unspecified organism: Secondary | ICD-10-CM

## 2018-05-23 DIAGNOSIS — J9601 Acute respiratory failure with hypoxia: Secondary | ICD-10-CM

## 2018-05-23 DIAGNOSIS — L8990 Pressure ulcer of unspecified site, unspecified stage: Secondary | ICD-10-CM

## 2018-05-23 DIAGNOSIS — R4182 Altered mental status, unspecified: Secondary | ICD-10-CM

## 2018-05-23 DIAGNOSIS — J9602 Acute respiratory failure with hypercapnia: Secondary | ICD-10-CM

## 2018-05-23 DIAGNOSIS — G9341 Metabolic encephalopathy: Secondary | ICD-10-CM

## 2018-05-23 LAB — LACTATE DEHYDROGENASE: LDH: 94 U/L — AB (ref 98–192)

## 2018-05-23 LAB — URINALYSIS, COMPLETE (UACMP) WITH MICROSCOPIC
BILIRUBIN URINE: NEGATIVE
Bacteria, UA: NONE SEEN
Glucose, UA: NEGATIVE mg/dL
Hgb urine dipstick: NEGATIVE
Ketones, ur: 5 mg/dL — AB
Leukocytes, UA: NEGATIVE
NITRITE: NEGATIVE
PH: 5 (ref 5.0–8.0)
Protein, ur: 30 mg/dL — AB
SPECIFIC GRAVITY, URINE: 1.018 (ref 1.005–1.030)

## 2018-05-23 LAB — CBC WITH DIFFERENTIAL/PLATELET
Basophils Absolute: 0 10*3/uL (ref 0.0–0.1)
Basophils Relative: 0 %
EOS ABS: 0 10*3/uL (ref 0.0–0.7)
Eosinophils Relative: 0 %
HEMATOCRIT: 34.5 % — AB (ref 36.0–46.0)
HEMOGLOBIN: 9.4 g/dL — AB (ref 12.0–15.0)
LYMPHS ABS: 0.5 10*3/uL — AB (ref 0.7–4.0)
Lymphocytes Relative: 5 %
MCH: 27.9 pg (ref 26.0–34.0)
MCHC: 27.2 g/dL — ABNORMAL LOW (ref 30.0–36.0)
MCV: 102.4 fL — ABNORMAL HIGH (ref 78.0–100.0)
Monocytes Absolute: 1.2 10*3/uL — ABNORMAL HIGH (ref 0.1–1.0)
Monocytes Relative: 12 %
NEUTROS ABS: 8 10*3/uL — AB (ref 1.7–7.7)
NEUTROS PCT: 83 %
Platelets: 223 10*3/uL (ref 150–400)
RBC: 3.37 MIL/uL — AB (ref 3.87–5.11)
RDW: 13.6 % (ref 11.5–15.5)
WBC: 9.6 10*3/uL (ref 4.0–10.5)

## 2018-05-23 LAB — LACTIC ACID, PLASMA
LACTIC ACID, VENOUS: 1.3 mmol/L (ref 0.5–1.9)
Lactic Acid, Venous: 0.8 mmol/L (ref 0.5–1.9)

## 2018-05-23 LAB — COMPREHENSIVE METABOLIC PANEL
ALT: 18 U/L (ref 0–44)
ANION GAP: 8 (ref 5–15)
AST: 15 U/L (ref 15–41)
Albumin: 3.8 g/dL (ref 3.5–5.0)
Alkaline Phosphatase: 42 U/L (ref 38–126)
BUN: 39 mg/dL — ABNORMAL HIGH (ref 8–23)
CHLORIDE: 90 mmol/L — AB (ref 98–111)
CO2: 42 mmol/L — AB (ref 22–32)
CREATININE: 1.67 mg/dL — AB (ref 0.44–1.00)
Calcium: 9 mg/dL (ref 8.9–10.3)
GFR, EST AFRICAN AMERICAN: 31 mL/min — AB (ref >60)
GFR, EST NON AFRICAN AMERICAN: 27 mL/min — AB (ref >60)
Glucose, Bld: 158 mg/dL — ABNORMAL HIGH (ref 70–99)
Potassium: 5.6 mmol/L — ABNORMAL HIGH (ref 3.5–5.1)
SODIUM: 140 mmol/L (ref 135–145)
Total Bilirubin: 0.7 mg/dL (ref 0.3–1.2)
Total Protein: 6.5 g/dL (ref 6.5–8.1)

## 2018-05-23 LAB — BASIC METABOLIC PANEL
Anion gap: 7 (ref 5–15)
BUN: 39 mg/dL — ABNORMAL HIGH (ref 8–23)
CALCIUM: 9.1 mg/dL (ref 8.9–10.3)
CO2: 44 mmol/L — ABNORMAL HIGH (ref 22–32)
CREATININE: 1.59 mg/dL — AB (ref 0.44–1.00)
Chloride: 90 mmol/L — ABNORMAL LOW (ref 98–111)
GFR calc Af Amer: 33 mL/min — ABNORMAL LOW (ref >60)
GFR, EST NON AFRICAN AMERICAN: 28 mL/min — AB (ref >60)
GLUCOSE: 161 mg/dL — AB (ref 70–99)
POTASSIUM: 5.6 mmol/L — AB (ref 3.5–5.1)
SODIUM: 141 mmol/L (ref 135–145)

## 2018-05-23 LAB — BLOOD GAS, ARTERIAL
ACID-BASE EXCESS: 12.4 mmol/L — AB (ref 0.0–2.0)
BICARBONATE: 35.5 mmol/L — AB (ref 20.0–28.0)
Delivery systems: POSITIVE
Drawn by: 27733
Expiratory PAP: 8
FIO2: 0.35
Inspiratory PAP: 18
O2 Saturation: 99.5 %
PH ART: 7.402 (ref 7.350–7.450)
Patient temperature: 37
RATE: 14 resp/min
pCO2 arterial: 61.8 mmHg — ABNORMAL HIGH (ref 32.0–48.0)
pO2, Arterial: 159 mmHg — ABNORMAL HIGH (ref 83.0–108.0)

## 2018-05-23 LAB — TROPONIN I: Troponin I: <0.03 ng/mL (ref <0.03)

## 2018-05-23 LAB — BRAIN NATRIURETIC PEPTIDE: B NATRIURETIC PEPTIDE 5: 960 pg/mL — AB (ref 0.0–100.0)

## 2018-05-23 LAB — PROCALCITONIN: Procalcitonin: <0.1 ng/mL

## 2018-05-23 LAB — MRSA PCR SCREENING: MRSA BY PCR: NEGATIVE

## 2018-05-23 LAB — AMMONIA: AMMONIA: 22 umol/L (ref 9–35)

## 2018-05-23 MED ORDER — BUDESONIDE 0.5 MG/2ML IN SUSP
0.5000 mg | Freq: Two times a day (BID) | RESPIRATORY_TRACT | Status: DC
  Administered 2018-05-23 – 2018-06-05 (×27): 0.5 mg via RESPIRATORY_TRACT
  Filled 2018-05-23 (×28): qty 2

## 2018-05-23 MED ORDER — IPRATROPIUM-ALBUTEROL 0.5-2.5 (3) MG/3ML IN SOLN
3.0000 mL | Freq: Four times a day (QID) | RESPIRATORY_TRACT | Status: DC
  Administered 2018-05-23 – 2018-05-26 (×15): 3 mL via RESPIRATORY_TRACT
  Filled 2018-05-23 (×15): qty 3

## 2018-05-23 MED ORDER — SODIUM CHLORIDE 0.9 % IV SOLN
1.0000 g | Freq: Once | INTRAVENOUS | Status: AC
  Administered 2018-05-23: 1 g via INTRAVENOUS
  Filled 2018-05-23: qty 10

## 2018-05-23 MED ORDER — CHLORHEXIDINE GLUCONATE 0.12 % MT SOLN
15.0000 mL | Freq: Two times a day (BID) | OROMUCOSAL | Status: DC
  Administered 2018-05-23 – 2018-06-10 (×36): 15 mL via OROMUCOSAL
  Filled 2018-05-23 (×36): qty 15

## 2018-05-23 MED ORDER — VANCOMYCIN HCL IN DEXTROSE 750-5 MG/150ML-% IV SOLN
750.0000 mg | INTRAVENOUS | Status: DC
  Administered 2018-05-24 – 2018-05-26 (×3): 750 mg via INTRAVENOUS
  Filled 2018-05-23 (×4): qty 150

## 2018-05-23 MED ORDER — ORAL CARE MOUTH RINSE
15.0000 mL | Freq: Two times a day (BID) | OROMUCOSAL | Status: DC
  Administered 2018-05-24 – 2018-06-10 (×30): 15 mL via OROMUCOSAL

## 2018-05-23 MED ORDER — SODIUM CHLORIDE 0.9 % IV SOLN
2.0000 g | Freq: Once | INTRAVENOUS | Status: AC
  Administered 2018-05-23: 2 g via INTRAVENOUS
  Filled 2018-05-23: qty 2

## 2018-05-23 MED ORDER — SODIUM POLYSTYRENE SULFONATE 15 GM/60ML PO SUSP
45.0000 g | Freq: Once | ORAL | Status: AC
  Administered 2018-05-23: 45 g via RECTAL
  Filled 2018-05-23: qty 180

## 2018-05-23 MED ORDER — FAMOTIDINE IN NACL 20-0.9 MG/50ML-% IV SOLN
20.0000 mg | INTRAVENOUS | Status: DC
  Administered 2018-05-23 – 2018-05-28 (×6): 20 mg via INTRAVENOUS
  Filled 2018-05-23 (×6): qty 50

## 2018-05-23 MED ORDER — METHYLPREDNISOLONE SODIUM SUCC 125 MG IJ SOLR
60.0000 mg | Freq: Every day | INTRAMUSCULAR | Status: DC
  Administered 2018-05-23 – 2018-05-24 (×2): 60 mg via INTRAVENOUS
  Filled 2018-05-23 (×2): qty 2

## 2018-05-23 MED ORDER — APIXABAN 2.5 MG PO TABS
2.5000 mg | ORAL_TABLET | Freq: Two times a day (BID) | ORAL | Status: DC
  Administered 2018-05-23 (×2): 2.5 mg via ORAL
  Filled 2018-05-23 (×2): qty 1

## 2018-05-23 MED ORDER — VANCOMYCIN HCL 10 G IV SOLR
1500.0000 mg | Freq: Once | INTRAVENOUS | Status: AC
  Administered 2018-05-23: 1500 mg via INTRAVENOUS
  Filled 2018-05-23: qty 1500

## 2018-05-23 MED ORDER — SODIUM CHLORIDE 0.9 % IV SOLN
1.0000 g | INTRAVENOUS | Status: DC
  Administered 2018-05-24 – 2018-05-25 (×2): 1 g via INTRAVENOUS
  Filled 2018-05-23 (×3): qty 1

## 2018-05-23 NOTE — Progress Notes (Signed)
Night shift telemetry coverage note.   The patient was seen due to somnolence since earlier in the day.  Her most recent were temp 987.8 F, HR 98, Resp 20, BP 112/64 mmHg and oxygen saturation 94%. She complained of headache earlier in the day and received tylenol. She had nausea early in the afternoon and received zofran 4 mg. She has been very somnolent most of the day, which is different from the previous day.   Head normocephalic. Eyes PERRL. Neck Supple, No JVD Lungs CTA Heart S1 S2, RRR Abdomen Soft, NT Extremities No E/C/C Neuro Grossly non focal. Psych AAOx3.  Assessment:  AMS CT head did not show any acute abnormalities. Symptoms are resolving spontaneously. Continue to monitor. Avoid sedating medications.  Sanda Kleinavid Justise Ehmann, MD  This event required over 30 minutes of critical care time.

## 2018-05-23 NOTE — Progress Notes (Addendum)
PROGRESS NOTE  Kathy CuriaVirginia L Page ZOX:[id number] DOB: 07/05/1931 DOA: 05/21/2018 PCP: Benita StabileHall, John Z, MD  Brief History:  84 y.o.femalewith medical history ofCOPD, systolic and diastolic CHF, hypertension, paroxysmal atrial for ablation, sinus node dysfunction, diabetes mellitus, coronary artery disease presenting with 1 month history of shortness of breath that has significantly worsened over the past 2 days. The patient visited the emergency department on 05/01/2018. She was discharged from the emergency department with prednisone for COPD exacerbation. There was no improvement with prednisone. She will follow-up with her primary care provider. She was given a diagnosis of pneumonia and started on antibiotics which she has completed at least 1 week prior to this admission. Unfortunately, the patient shortness of breath continued to worsen. The patient endorses compliance with all her medications. The patient states that she has had some increase in her lower extremity edema as well as increasing abdominal girth. She also notes some PND type symptoms at nighttime. In the emergency department, the patient was afebrile hemodynamically stable saturating 100% on 2.5 L. BNP was 787. Potassium is 5.3. Chest x-ray showed a left pleural effusion which is slightly worse. She was started on IV lasix with clinical improvement.  Overnight from 05/22/2018-05/23/2018, the patient became somnolent and difficult to arouse.  CT of the brain was negative.  ABG suggests the patient was hypercarbic.  She was placed on BiPAP and transferred to the stepdown unit.   Assessment/Plan: Acute respiratory failure with hypoxia and hypercarbia -Multifactorial including CHF, HCAP in the setting of COPD -pt has ~50 pack years -continue BiPAP -wean BiPAP as tolerated  Acute on chronic systolic and diastolic CHF -daily weights -accurate I/O's--neg 2.3 L -fluid restrict -Echo--EF 45%, AK basal inferior, mild  TR, moderate MR, mild left ear, severe decreased RV function -holding lasix today due to increasing creatinine -appears more clinically euvolemic  HCAP -start vancomycin and cefepime -check procalcitonin -Lactic acid 0.8 -MRSA screen -personally reviewed CXR--vascular congestion, increase RUL, RLL opacity  Acute metabolic encephalopathy -Multifactorial including respiratory failure, acute on chronic renal failure, infectious process -Improving on BiPAP -Ammonia 22 -Check UA and urine culture -discontinue ativan  Left Pleural effusion -05/21/18--thoracocentesis--300 cc removed -Exudative Lights' criteria -Likely parapneumonic effusion   Paroxysmal atrial fibrillation -Continue apixaban -Continue bisoprolol  COPD Exacerbation -start duonebs -start pulmicort -start IV solumedrol  Diabetes mellitus type 2 -Appears to be diet-controlled in the outpatient setting -Hemoglobin A1c--6.0 -NovoLog sliding scale  Sinus node dysfunction -Status post PPM  Coronary artery disease -No chest pain presently -Continue bisoprolol  Hyperkalemia -place on tele -improvement with diuresis IV -A.m. BMP -kayexalate x 1  Goals of care -Advance care planning, including the explanation and discussion of advance directives was carried out with the patient and family.  Code status including explanations of "Full Code" and "DNR" and alternatives were discussed in detail.  Discussion of end-of-life issues including but not limited palliative care, hospice care and the concept of hospice, other end-of-life care options, power of attorney for health care decisions, living wills, and physician orders for life-sustaining treatment were also discussed with the patient and family.  Total face to face time 16 minutes. -DNR     Disposition Plan:   remain in SDU Family Communication:   Daughter updated at bedside 8/4--The patient is critically ill with multiple organ systems failure and  requires high complexity decision making for assessment and support, frequent evaluation and titration of therapies, application of advanced monitoring technologies and extensive  interpretation of multiple databases.  Critical care time - 55 mins. In addition to time spent by Dr. Robb Matar   Consultants:  none  Code Status:  FULL  DVT Prophylaxis:  apixaban   Procedures: As Listed in Progress Note Above  Antibiotics: None     Subjective: Patient is awake and alert and on BiPAP.  She denies any headache, chest discomfort, shortness breath, vomiting, abdominal pain, dysuria.  Objective: Vitals:   05/22/18 2258 05/23/18 0527 05/23/18 0538 05/23/18 0642  BP: 112/64 (!) 79/46 102/60   Pulse: 98 (!) 102 96 (!) 104  Resp: 16 17 (!) 22 14  Temp: 97.8 F (36.6 C) 97.6 F (36.4 C) 97.7 F (36.5 C)   TempSrc: Oral Oral Oral   SpO2: 94% 94% 92% 97%  Weight:   67.2 kg (148 lb 2.4 oz)   Height:        Intake/Output Summary (Last 24 hours) at 05/23/2018 0754 Last data filed at 05/22/2018 2300 Gross per 24 hour  Intake 600 ml  Output 1100 ml  Net -500 ml   Weight change: -2.654 kg (-5 lb 13.6 oz) Exam:   General:  Pt is alert, follows commands appropriately, not in acute distress  HEENT: No icterus, No thrush, No neck mass, [place]/AT  Cardiovascular: RRR, S1/S2, no rubs, no gallops  Respiratory: Diminished breath sounds bilateral.  Bibasilar crackles.  No wheezing.  Abdomen: Soft/+BS, non tender, non distended, no guarding  Extremities: trace LE edema, No lymphangitis, No petechiae, No rashes, no synovitis   Data Reviewed: I have personally reviewed following labs and imaging studies Basic Metabolic Panel: Recent Labs  Lab 05/21/18 1129 05/22/18 0603 05/23/18 0600  NA 139 142 140  141  K 5.3* 4.8 5.6*  5.6*  CL 94* 93* 90*  90*  CO2 39* 43* 42*  44*  GLUCOSE 154* 143* 158*  161*  BUN 28* 25* 39*  39*  CREATININE 0.97 0.93 1.67*  1.59*  CALCIUM 9.3 8.9  9.0  9.1   Liver Function Tests: Recent Labs  Lab 05/23/18 0600  AST 15  ALT 18  ALKPHOS 42  BILITOT 0.7  PROT 6.5  ALBUMIN 3.8   No results for input(s): LIPASE, AMYLASE in the last 168 hours. Recent Labs  Lab 05/23/18 0600  AMMONIA 22   Coagulation Profile: No results for input(s): INR, PROTIME in the last 168 hours. CBC: Recent Labs  Lab 05/21/18 1129 05/23/18 0600  WBC 8.8 9.6  NEUTROABS 6.4 8.0*  HGB 10.2* 9.4*  HCT 35.0* 34.5*  MCV 97.8 102.4*  PLT 200 223   Cardiac Enzymes: Recent Labs  Lab 05/21/18 1129 05/23/18 0600  TROPONINI <0.03 <0.03   BNP: Invalid input(s): POCBNP CBG: Recent Labs  Lab 05/21/18 1614  GLUCAP 129*   HbA1C: Recent Labs    05/22/18 0603  HGBA1C 6.0*   Urine analysis:    Component Value Date/Time   COLORURINE AMBER (A) 05/21/2018 1222   APPEARANCEUR CLOUDY (A) 05/21/2018 1222   LABSPEC 1.025 05/21/2018 1222   PHURINE 6.0 05/21/2018 1222   GLUCOSEU NEGATIVE 05/21/2018 1222   HGBUR LARGE (A) 05/21/2018 1222   BILIRUBINUR NEGATIVE 05/21/2018 1222   KETONESUR NEGATIVE 05/21/2018 1222   PROTEINUR 30 (A) 05/21/2018 1222   NITRITE NEGATIVE 05/21/2018 1222   LEUKOCYTESUR SMALL (A) 05/21/2018 1222   Sepsis Labs: @LABRCNTIP (procalcitonin:4,lacticidven:4) ) Recent Results (from the past 240 hour(s))  Culture, body fluid-bottle     Status: None (Preliminary result)   Collection Time: 05/21/18  3:05 PM  Result Value Ref Range Status   Specimen Description THORACENTESIS  Final   Special Requests NONE  Final   Culture   Final    NO GROWTH 2 DAYS Performed at [facility], [address], [place], [place] [address]    Report Status PENDING  Incomplete     Scheduled Meds: . apixaban  5 mg Oral BID  . bisoprolol  2.5 mg Oral Daily  . escitalopram  20 mg Oral QHS  . furosemide  40 mg Intravenous BID  . pantoprazole  40 mg Oral Daily  . sodium chloride flush  3 mL Intravenous Q12H  . vitamin B-12  1,000 mcg Oral Daily     Continuous Infusions: . sodium chloride    . [START ON 05/24/2018] ceFEPime (MAXIPIME) IV    . ceFEPime (MAXIPIME) IV    . vancomycin    . [START ON 05/24/2018] vancomycin      Procedures/Studies: Dg Chest 1 View  Result Date: 05/21/2018 CLINICAL DATA:  LEFT pleural effusion post thoracentesis EXAM: CHEST  1 VIEW COMPARISON:  Earlier exam of 05/21/2018 FINDINGS: Stable LEFT subclavian pacemaker and cardiac enlargement. Atherosclerotic calcification aorta. Persistent LEFT basilar opacity by atelectasis and effusion. No pneumothorax following thoracentesis. Remaining lungs clear. Bones demineralized. IMPRESSION: No pneumothorax following LEFT thoracentesis. Electronically Signed   By: Ulyses Southward M.D.   On: 05/21/2018 15:59   Dg Chest 1 View  Result Date: 05/21/2018 CLINICAL DATA:  Lateral radiograph requested.  Query pneumonia. EXAM: CHEST  1 VIEW COMPARISON:  AP radiograph done earlier. : The heart remains enlarged. Retrocardiac density likely effusion and atelectasis, no definite consolidation, is increased from 05/10/2018. IMPRESSION: Worsening aeration. Electronically Signed   By: Elsie Stain M.D.   On: 05/21/2018 12:48   Dg Chest 2 View  Result Date: 05/10/2018 CLINICAL DATA:  One-week history of progressive shortness of breath. Former smoker with 80 pack-year history. Current history of COPD, hypertension, diabetes and COPD. EXAM: CHEST - 2 VIEW COMPARISON:  05/01/2018, 06/27/2016 and earlier. FINDINGS: Cardiac silhouette moderately enlarged, unchanged dating back to 2011. LEFT subclavian dual lead transvenous pacemaker unchanged and intact. Thoracic aorta atherosclerotic, unchanged. Hilar and mediastinal contours otherwise unremarkable. Pulmonary venous hypertension without overt edema. Hyperinflation, mildly prominent bronchovascular markings and mild central peribronchial thickening, unchanged. Small LEFT pleural effusion, unchanged since the most recent prior examination 9 days ago. No  new pulmonary parenchymal abnormalities. Degenerative changes and DISH involving the thoracic spine. Severe degenerative changes involving the LEFT shoulder joint with associated calcified loose bodies. IMPRESSION: 1. Small LEFT pleural effusion, unchanged since the chest x-ray 9 days ago. No acute cardiopulmonary disease otherwise. 2. Stable cardiomegaly without evidence of pulmonary edema. 3.  Emphysema (ICD10-J43.9). Electronically Signed   By: Hulan Saas M.D.   On: 05/10/2018 13:01   Ct Head Wo Contrast  Result Date: 05/22/2018 CLINICAL DATA:  84 y/o F; Altered level of consciousness (LOC), unexplained. EXAM: CT HEAD WITHOUT CONTRAST TECHNIQUE: Contiguous axial images were obtained from the base of the skull through the vertex without intravenous contrast. COMPARISON:  06/21/2016 CT head FINDINGS: Brain: No evidence of acute infarction, hemorrhage, hydrocephalus, extra-axial collection or mass lesion/mass effect. Mild chronic microvascular ischemic changes and volume loss of the brain for age. Vascular: Calcific atherosclerosis of carotid siphons. No hyperdense vessel identified. Skull: Normal. Negative for fracture or focal lesion. Sinuses/Orbits: No acute finding. Other: None. IMPRESSION: 1. No acute intracranial abnormality identified. 2. Mild chronic microvascular ischemic changes and volume loss of the  brain for age. Electronically Signed   By: Mitzi Hansen M.D.   On: 05/22/2018 23:45   US Venous Img Lower Bilateral  Result Date: 05/22/2018 CLINICAL DATA:  84 year old female with bilateral lower extremity pain and shortness of breath EXAM: BILATERAL LOWER EXTREMITY VENOUS DOPPLER ULTRASOUND TECHNIQUE: Gray-scale sonography with graded compression, as well as color Doppler and duplex ultrasound were performed to evaluate the lower extremity deep venous systems from the level of the common femoral vein and including the common femoral, femoral, profunda femoral, popliteal and calf  veins including the posterior tibial, peroneal and gastrocnemius veins when visible. The superficial great saphenous vein was also interrogated. Spectral Doppler was utilized to evaluate flow at rest and with distal augmentation maneuvers in the common femoral, femoral and popliteal veins. COMPARISON:  None. FINDINGS: RIGHT LOWER EXTREMITY Common Femoral Vein: No evidence of thrombus. Normal compressibility, respiratory phasicity and response to augmentation. Saphenofemoral Junction: No evidence of thrombus. Normal compressibility and flow on color Doppler imaging. Profunda Femoral Vein: No evidence of thrombus. Normal compressibility and flow on color Doppler imaging. Femoral Vein: No evidence of thrombus. Normal compressibility, respiratory phasicity and response to augmentation. Popliteal Vein: No evidence of thrombus. Normal compressibility, respiratory phasicity and response to augmentation. Calf Veins: No evidence of thrombus. Normal compressibility and flow on color Doppler imaging. Superficial Great Saphenous Vein: No evidence of thrombus. Normal compressibility. Venous Reflux:  None. Other Findings:  None. LEFT LOWER EXTREMITY Common Femoral Vein: No evidence of thrombus. Normal compressibility, respiratory phasicity and response to augmentation. Saphenofemoral Junction: No evidence of thrombus. Normal compressibility and flow on color Doppler imaging. Profunda Femoral Vein: No evidence of thrombus. Normal compressibility and flow on color Doppler imaging. Femoral Vein: No evidence of thrombus. Normal compressibility, respiratory phasicity and response to augmentation. Popliteal Vein: No evidence of thrombus. Normal compressibility, respiratory phasicity and response to augmentation. Calf Veins: No evidence of thrombus. Normal compressibility and flow on color Doppler imaging. Superficial Great Saphenous Vein: No evidence of thrombus. Normal compressibility. Venous Reflux:  None. Other Findings:  None.  IMPRESSION: 1. No evidence of deep venous thrombosis in either lower extremity. 2. Increased pulsatility of the venous waveforms bilaterally. Similar findings can be seen in the setting of elevated right heart pressures. Common differential considerations include tricuspid regurgitation, right-sided heart failure, pulmonary arterial hypertension and long-standing COPD. Electronically Signed   By: Malachy Moan M.D.   On: 05/22/2018 12:45   Dg Chest Port 1 View  Result Date: 05/23/2018 CLINICAL DATA:  Hypoxia. EXAM: PORTABLE CHEST 1 VIEW COMPARISON:  Radiographs 2 days ago 05/21/2018 FINDINGS: Left-sided pacemaker remains in place. Increasing [place] bibasilar opacity likely combination of pleural fluid and atelectasis. The heart is enlarged. Unchanged mediastinal contours from prior exam. Increasing vascular congestion. No pneumothorax. IMPRESSION: Increasing hazy opacity at the lung bases, left greater than right, likely pleural fluid and adjacent atelectasis. Increasing vascular congestion. Electronically Signed   By: Rubye Oaks M.D.   On: 05/23/2018 06:52   Dg Chest Portable 1 View  Result Date: 05/21/2018 CLINICAL DATA:  Short of breath and weakness. EXAM: PORTABLE CHEST 1 VIEW COMPARISON:  05/10/2018 FINDINGS: Cardiac enlargement with dual lead pacemaker. Negative for heart failure. Mild left lower lobe airspace disease and probable small left effusion unchanged from the prior study. Right lung remains clear. IMPRESSION: Mild left lower lobe airspace disease and probable left effusion unchanged. This area is difficult to evaluate on portable image given the large cardiac silhouette. Electronically Signed   By: Leonette Most  Chestine Spore M.D.   On: 05/21/2018 11:45   Dg Chest Port 1 View  Result Date: 05/01/2018 CLINICAL DATA:  Shortness of breath and chest pain EXAM: PORTABLE CHEST 1 VIEW COMPARISON:  Chest radiograph 06/27/2016 FINDINGS: There is a small left pleural effusion with associated atelectasis.  Cardiomegaly is unchanged. Left chest wall pacemaker with leads in expected position. No pulmonary edema or focal consolidation. IMPRESSION: Small no pulmonary edema. Left pleural effusion with associated basilar atelectasis. Electronically Signed   By: Deatra Robinson M.D.   On: 05/01/2018 06:42   US Thoracentesis Asp Pleural Space W/img Guide  Result Date: 05/21/2018 INDICATION: LEFT pleural effusion EXAM: ULTRASOUND GUIDED DIAGNOSTIC LEFT THORACENTESIS MEDICATIONS: None. COMPLICATIONS: None immediate. PROCEDURE: Procedure, benefits, and risks of procedure were discussed with patient. Written informed consent for procedure was obtained. Time out protocol followed. Pleural effusion localized by ultrasound at the posterior LEFT hemithorax. Skin prepped and draped in usual sterile fashion. Skin and soft tissues anesthetized with 10 ML of 1% lidocaine. 8 French thoracentesis catheter placed into the LEFT pleural space. 300 mL of clear serous fluid aspirated by syringe pump. Procedure tolerated well by patient without immediate complication. FINDINGS: A total of approximately 300 mL of LEFT pleural fluid was removed. Samples were sent to the laboratory as requested by the clinical team. IMPRESSION: Successful ultrasound guided LEFT thoracentesis yielding 300 mL of pleural fluid. Electronically Signed   By: Ulyses Southward M.D.   On: 05/21/2018 15:57    Catarina Hartshorn, DO  [facility] Pager [phone]  If 7PM-7AM, please contact night-coverage [email or web address] Password [email or web address] 05/23/2018, 7:54 AM   LOS: 2 days

## 2018-05-23 NOTE — Progress Notes (Signed)
At 2250 patient was found to be very difficult to arouse.  Patient was unable to tell nurse her name, DOB, or where she was at, patient mental status was off from earlier in shift.  Vitals taken and entered MD notified and neuros were checked on her.  MD placed order for STAT head CT.  Completed.  Patient slowly coming around to her previous mental status.  Will continue to monitor patient for any additional changes.

## 2018-05-23 NOTE — Progress Notes (Signed)
Night shift telemetry coverage note.  The patient was reevaluated due to persistent somnolence. ABG, lactic acid, ammonia, CBC with differential, CMP, troponin and portable chest radiograph ordered.  The patient has been transferred to stepdown.  Sanda Kleinavid Ortiz, MD.

## 2018-05-23 NOTE — Progress Notes (Signed)
Patient found to be difficult to arouse again after having a Head Ct earlier in the night for the same issue.  MD notified and came to assess the patient.  Orders placed and carried out.  [facility]

## 2018-05-23 NOTE — Progress Notes (Signed)
Per respiratory, pt taken off Bipap and placed on 3 L [place] saturating 98%

## 2018-05-23 NOTE — Progress Notes (Signed)
CRITICAL VALUE ALERT  Critical Value:  Anaerobic fluid blood culture showing gram negative rods and gram positive cocci.   Date & Time Notied:  05/23/18 @ 1122  Provider Notified: Dr. Arbutus Leasat  Orders Received/Actions taken: pt currently on Maxipime and vancomycin

## 2018-05-23 NOTE — Progress Notes (Signed)
Pharmacy Antibiotic Note  Kathy CuriaVirginia L Page is a 84 y.o. female admitted on 05/21/2018 with pneumonia.  Pharmacy has been consulted for Vancomycin and Cefepime dosing.  Plan: Vancomycin 1500 mg IV x 1 dose Vancomycin 750 mg IV every 24 hours.  Goal trough 15-20 mcg/mL.  Cefepime 2000 mg IV x 1 then Cefepime 1000 mg IV every 24 hours Monitor labs, c/s, and vanco trough as indicated  Height: 5\' 4"  (162.6 cm) Weight: 148 lb 2.4 oz (67.2 kg) IBW/kg (Calculated) : 54.7  Temp (24hrs), Avg:97.8 F (36.6 C), Min:97.6 F (36.4 C), Max:98.2 F (36.8 C)  Recent Labs  Lab 05/21/18 1129 05/22/18 0603 05/23/18 0600  WBC 8.8  --  9.6  CREATININE 0.97 0.93 1.67*  1.59*  LATICACIDVEN  --   --  0.8    Estimated Creatinine Clearance: 24.4 mL/min (A) (by C-G formula based on SCr of 1.59 mg/dL (H)).    Allergies  Allergen Reactions  . Codeine Nausea Only  . Heparin Other (See Comments)    Causes internal bleeding     Antimicrobials this admission: Cefepime 8/4 >>  Vanco 8/4 >>    Microbiology results: 8/4 BCx: pending 8/4 UCx: pending  8/4 Sputum: pending  8/4 MRSA PCR: pending  Thank you for allowing pharmacy to be a part of this patient's care.  Tad MooreSteven C Garrette Caine 05/23/2018 7:41 AM

## 2018-05-24 ENCOUNTER — Inpatient Hospital Stay (HOSPITAL_COMMUNITY): Payer: Medicare Other

## 2018-05-24 DIAGNOSIS — Z7189 Other specified counseling: Secondary | ICD-10-CM

## 2018-05-24 LAB — CBC
HEMATOCRIT: 27.3 % — AB (ref 36.0–46.0)
HEMOGLOBIN: 8 g/dL — AB (ref 12.0–15.0)
MCH: 28.5 pg (ref 26.0–34.0)
MCHC: 29.3 g/dL — AB (ref 30.0–36.0)
MCV: 97.2 fL (ref 78.0–100.0)
Platelets: 171 10*3/uL (ref 150–400)
RBC: 2.81 MIL/uL — ABNORMAL LOW (ref 3.87–5.11)
RDW: 13.8 % (ref 11.5–15.5)
WBC: 6.5 10*3/uL (ref 4.0–10.5)

## 2018-05-24 LAB — BASIC METABOLIC PANEL
Anion gap: 10 (ref 5–15)
BUN: 48 mg/dL — AB (ref 8–23)
CHLORIDE: 89 mmol/L — AB (ref 98–111)
CO2: 39 mmol/L — AB (ref 22–32)
CREATININE: 1.19 mg/dL — AB (ref 0.44–1.00)
Calcium: 8.5 mg/dL — ABNORMAL LOW (ref 8.9–10.3)
GFR calc Af Amer: 47 mL/min — ABNORMAL LOW (ref >60)
GFR calc non Af Amer: 40 mL/min — ABNORMAL LOW (ref >60)
GLUCOSE: 121 mg/dL — AB (ref 70–99)
Potassium: 3.1 mmol/L — ABNORMAL LOW (ref 3.5–5.1)
Sodium: 138 mmol/L (ref 135–145)

## 2018-05-24 LAB — PROCALCITONIN: Procalcitonin: <0.1 ng/mL

## 2018-05-24 LAB — HEPARIN LEVEL (UNFRACTIONATED)

## 2018-05-24 MED ORDER — POTASSIUM CHLORIDE CRYS ER 20 MEQ PO TBCR
20.0000 meq | EXTENDED_RELEASE_TABLET | Freq: Once | ORAL | Status: AC
  Administered 2018-05-24: 20 meq via ORAL
  Filled 2018-05-24: qty 1

## 2018-05-24 MED ORDER — METOPROLOL TARTRATE 5 MG/5ML IV SOLN
2.5000 mg | Freq: Four times a day (QID) | INTRAVENOUS | Status: DC | PRN
  Administered 2018-05-24: 2.5 mg via INTRAVENOUS
  Filled 2018-05-24: qty 5

## 2018-05-24 MED ORDER — HEPARIN (PORCINE) IN NACL 100-0.45 UNIT/ML-% IJ SOLN
850.0000 [IU]/h | INTRAMUSCULAR | Status: AC
  Administered 2018-05-24 – 2018-05-25 (×2): 1000 [IU]/h via INTRAVENOUS
  Administered 2018-05-27: 850 [IU]/h via INTRAVENOUS
  Filled 2018-05-24 (×3): qty 250

## 2018-05-24 MED ORDER — LORAZEPAM 2 MG/ML IJ SOLN
0.5000 mg | Freq: Once | INTRAMUSCULAR | Status: AC
  Administered 2018-05-24: 0.5 mg via INTRAVENOUS
  Filled 2018-05-24: qty 1

## 2018-05-24 MED ORDER — PREDNISONE 20 MG PO TABS
50.0000 mg | ORAL_TABLET | Freq: Every day | ORAL | Status: DC
  Administered 2018-05-25 – 2018-06-05 (×12): 50 mg via ORAL
  Filled 2018-05-24 (×3): qty 2
  Filled 2018-05-24: qty 1
  Filled 2018-05-24: qty 2
  Filled 2018-05-24: qty 1
  Filled 2018-05-24: qty 2
  Filled 2018-05-24: qty 1
  Filled 2018-05-24 (×2): qty 2
  Filled 2018-05-24: qty 1
  Filled 2018-05-24: qty 2

## 2018-05-24 MED ORDER — APIXABAN 5 MG PO TABS
5.0000 mg | ORAL_TABLET | Freq: Two times a day (BID) | ORAL | Status: DC
  Administered 2018-05-24: 5 mg via ORAL
  Filled 2018-05-24: qty 1

## 2018-05-24 NOTE — Progress Notes (Signed)
Discussed with T[facility] regarding left empyema of lung.  Patient is not a good surgical candidate.   Dr. Donata ClayVan Trigt recommends pigtail catheter drainage-->request IR to perform.  D/c apixaban.  Start heparin bridge for afib. Patient and daughter informed and agree to proceed.  DTat

## 2018-05-24 NOTE — Progress Notes (Signed)
ANTICOAGULATION CONSULT NOTE - Initial Consult  Pharmacy Consult for heparin Indication: atrial fibrillation  Allergies  Allergen Reactions  . Codeine Nausea Only  . Heparin Other (See Comments)    Causes internal bleeding     Patient Measurements: Height: 5\' 4"  (162.6 cm) Weight: 153 lb 10.6 oz (69.7 kg) IBW/kg (Calculated) : 54.7 Heparin Dosing Weight: 68.8 kg  Vital Signs: Temp: 98.6 F (37 C) (08/05 1323) Temp Source: Oral (08/05 1323) BP: 115/80 (08/05 1100) Pulse Rate: 101 (08/05 1100)  Labs: Recent Labs    05/22/18 0603 05/23/18 0600 05/24/18 0422 05/24/18 1423  HGB  --  9.4* 8.0*  --   HCT  --  34.5* 27.3*  --   PLT  --  223 171  --   HEPARINUNFRC  --   --   --  >2.20*  CREATININE 0.93 1.67*  1.59* 1.19*  --   TROPONINI  --  <0.03  --   --     Estimated Creatinine Clearance: 33.1 mL/min (A) (by C-G formula based on SCr of 1.19 mg/dL (H)).   Medical History: Past Medical History:  Diagnosis Date  . Asthma   . COPD (chronic obstructive pulmonary disease) (HCC)    Oxygen dependent  . Coronary atherosclerosis of native coronary artery   . Essential hypertension, benign   . History of GI bleed    Ulcer  . Hyperlipidemia   . Old inferior wall myocardial infarction 1999   [facility]  . PAF (paroxysmal atrial fibrillation) (HCC)   . Sinus node dysfunction (HCC)    [employer] PPM  . Type 2 diabetes mellitus (HCC)     Medications:  Medications Prior to Admission  Medication Sig Dispense Refill Last Dose  . apixaban (ELIQUIS) 5 MG TABS tablet Take 1 tablet (5 mg total) by mouth 2 (two) times daily. 60 tablet 0 05/21/2018 at 0830  . bisoprolol (ZEBETA) 5 MG tablet Take 1 tablet (5 mg total) by mouth daily. Pt needs appointment for further refills. (Patient taking differently: Take 2.5 mg by mouth daily. Take one tablet by mouth in the morning and one at night.) 90 tablet 0 05/21/2018 at 0830  . furosemide (LASIX) 40 MG tablet Take 40 mg by mouth daily. Daily at  0800 am   05/20/2018 at Unknown time  . ipratropium-albuterol (DUONEB) 0.5-2.5 (3) MG/3ML SOLN Take 3 mLs by nebulization 3 (three) times daily. (Patient taking differently: Take 3 mLs by nebulization every 6 (six) hours as needed (for shortness of breath). ) 360 mL  Past Month at Unknown time  . LORazepam (ATIVAN) 0.5 MG tablet Take 1 tablet (0.5 mg total) by mouth daily. Take one tablet by mouth once daily (Patient taking differently: Take 0.5 mg by mouth at bedtime. Take one tablet by mouth once daily) 30 tablet 0 05/21/2018 at 1000  . Melatonin 10 MG TABS Take 1 tablet by mouth at bedtime.   05/20/2018 at Unknown time  . Multiple Vitamins-Minerals (CENTRUM SILVER 50+WOMEN PO) Take 1 tablet by mouth daily.   05/21/2018 at 0830  . nitroGLYCERIN (NITROSTAT) 0.4 MG SL tablet Place 1 tablet (0.4 mg total) under the tongue every 5 (five) minutes as needed for chest pain. 25 tablet 3 unknown  . omeprazole (PRILOSEC) 20 MG capsule Take 1 capsule (20 mg total) by mouth 2 (two) times daily before a meal. 60 capsule 11 05/21/2018 at 0830  . OXYGEN Inhale 2.5 L into the lungs continuous.   Taking  . potassium chloride (K-DUR,KLOR-CON) 10 MEQ  tablet Take 10 mEq by mouth 2 (two) times daily.   05/21/2018 at 0830  . vitamin B-12 (CYANOCOBALAMIN) 1000 MCG tablet Take 1,000 mcg by mouth daily.   05/21/2018 at 0830  . escitalopram (LEXAPRO) 20 MG tablet Take 20 mg by mouth at bedtime.    on the way currently in mail order    Assessment: Pharmacy consulted to dose heparin in patient with atrial fibrillation. Patient is currently on apixaban at home with last dose being given 8/5 0917.  Patient has listed allergy to heparin with history of GI bleed ~20 years ago.  MD agrees to continue with heparin and pharmacy will monitor labs and s/s of bleeding.  Will initiate heparin without bolus and be conservative with dosing.  Pharmacy will dose based on aPTT levels until it correlates with heparin levels.  Goal of Therapy:  Heparin  level 0.3-0.7 units/ml aPTT 66-102 sec seconds Monitor platelets by anticoagulation protocol: Yes   Plan:  Start heparin infusion at 1000 units/hr Check anti-Xa level in 8 hours and daily while on heparin Continue to monitor H&H and platelets  Salvatore Decent Sheyli Horwitz 05/24/2018,3:10 PM

## 2018-05-24 NOTE — Progress Notes (Signed)
Patient discussed with Dr. Deanne CofferHassell for left pigtail chest tube - he is agreeable to proceed.  [facility] PA will speak with RN about arrangements for arrival/procedure time.   Continue to hold Eliquis.   IR PA will place orders in chart for 8/8 procedure.  Lynnette CaffeyShannon Donte Kary, PA-C 05/24/18 1534

## 2018-05-24 NOTE — Progress Notes (Signed)
PROGRESS NOTE  [name] ZOX:[id number] DOB: 1931-06-09 DOA: 05/21/2018 PCP: Benita Stabile, MD Brief History: 84 y.o.femalewith medical history ofCOPD, systolic and diastolic CHF, hypertension, paroxysmal atrial for ablation, sinus node dysfunction, diabetes mellitus, coronary artery disease presenting with 1 month history of shortness of breath that has significantly worsened over the past 2 days. The patient visited the emergency department on 05/01/2018. She was discharged from the emergency department with prednisone for COPD exacerbation. There was no improvement with prednisone. She will follow-up with her primary care provider. She was given a diagnosis of pneumonia and started on antibiotics which she has completed at least 1 week prior to this admission. Unfortunately, the patient shortness of breath continued to worsen. The patient endorses compliance with all her medications.  She denied fevers, chills. The patient states that she has had some increase in her lower extremity edema as well as increasing abdominal girth. She also notes some PND type symptoms at nighttime. In the emergency department, the patient was afebrile hemodynamically stable saturating 100% on 2.5 L. BNP was 787. Potassium is 5.3. Chest x-ray showed a left pleural effusion which is slightly worse.She was  initially started on IV lasix with clinical improvement. The patient underwent thoracocentesis on 05/21/18 removing 300cc fluid.  Overnight from 05/22/2018-05/23/2018, the patient became somnolent and difficult to arouse.  CT of the brain was negative.  ABG suggests the patient was hypercarbic.  She was placed on BiPAP and transferred to the stepdown unit.  She was started on vancomycin and cefepime due to concerns for pneumonia and pleural fluid results with 1035 WBCs.  Later on 05/23/18, pleural fluid culture became positive with gram stain GNR and GPC.    Assessment/Plan: Acute respiratory  failure with hypoxia and hypercarbia -Multifactorial including CHF, HCAP in the setting of COPD -pt has ~50 pack years -placed on  BiPAP 05/23/18>>>now stable on 2L  Acute on chronic systolicanddiastolic CHF -daily weights -accurate I/O's--incomplete -fluid restrict -Echo--EF 45%, AK basal inferior, mild TR, moderate MR, mild left ear, severe decreased RV function -now appears clinically euvolemic after IV lasix -restart po lasix  HCAP/Exudative pleural effusion -started vancomycin and cefepime -check procalcitonin <0.10 -Lactic acid peaked 1.3 -MRSA screen--neg -personally reviewed CXR--vascular congestion, increase L>R pleural effusion -continue current abx pending final pleural fluid culture  LeftPleural effusion--Exudative -05/21/18--thoracocentesis--300 cc removed -concerned about empyema -Exudative Lights' criteria -contact thoracic surgery about possible need for surgery  Acute metabolic encephalopathy -Multifactorial including respiratory failure, acute on chronic renal failure, infectious process -At baseline, the patient is alert and oriented x4 and able to perform all her activities of daily living -Improving on BiPAP -Ammonia 22 -Check UA--neg for pyuria -discontinue ativan -Mental status much improved, but remains intermittently confused  LeftPleural effusion--Exudative -05/21/18--thoracocentesis--300 cc removed -concerned about empyema -Exudative Lights' criteria  Paroxysmal atrial fibrillation -Continue apixaban -Continue bisoprolol  COPD Exacerbation -start duonebs -start pulmicort -wheezing improved -start IV solumedrol>>>prednisone  Diabetes mellitus type 2 -Appears to be diet-controlled in the outpatient setting -Hemoglobin A1c--6.0 -NovoLog sliding scale  Sinus node dysfunction -Status post PPM  Coronary artery disease -No chest pain presently -Continue bisoprolol  Hyperkalemia -place on tele -improvement with diuresis  IV -A.m. BMP -kayexalate x 1-->resoloved  Goals of care -Advance care planning, including the explanation and discussion of advance directives was carried out with the patient and family.  Code status including explanations of "Full Code" and "DNR" and alternatives were discussed in detail.  Discussion  of end-of-life issues including but not limited palliative care, hospice care and the concept of hospice, other end-of-life care options, power of attorney for health care decisions, living wills, and physician orders for life-sustaining treatment were also discussed with the patient and family.  -DNR, but daughter still wants full scope of care     Disposition Plan: remain in SDU Family Communication: Daughter updated at bedside 8/5--The patient is critically ill with multiple organ systems failure and requires high complexity decision making for assessment and support, frequent evaluation and titration of therapies, application of advanced monitoring technologies and extensive interpretation of multiple databases.  Critical care time - 45 mins.   Consultants:none  Code Status: DNR  DVT Prophylaxis:apixaban   Procedures: As Listed in Progress Note Above  Antibiotics: vanco 8/4>>> Cefepime 8/4>>>   Subjective: Patient is breathing better today.  She complains of a nonproductive cough.  She denies nausea, vomiting, diarrhea, abdominal pain, chest pain.  Objective: Vitals:   05/24/18 0500 05/24/18 0600 05/24/18 0700 05/24/18 0800  BP: (!) 87/46 (!) 102/49 111/66 (!) 117/58  Pulse: 87 87 (!) 114 88  Resp: (!) 25 19 (!) 30 (!) 21  Temp:      TempSrc:      SpO2: 93% 96% 96% 98%  Weight:  69.7 kg (153 lb 10.6 oz)    Height:        Intake/Output Summary (Last 24 hours) at 05/24/2018 0952 Last data filed at 05/24/2018 0925 Gross per 24 hour  Intake 837.01 ml  Output -  Net 837.01 ml   Weight change: 2.5 kg (5 lb 8.2 oz) Exam:   General:  Pt is alert,  follows commands appropriately, not in acute distress  HEENT: No icterus, No thrush, No neck mass, Gordon/AT  Cardiovascular: IRRR, S1/S2, no rubs, no gallops  Respiratory: bibasilar rales, diminished breath sounds. No wheezing  Abdomen: Soft/+BS, non tender, non distended, no guarding  Extremities: No edema, No lymphangitis, No petechiae, No rashes, no synovitis   Data Reviewed: I have personally reviewed following labs and imaging studies Basic Metabolic Panel: Recent Labs  Lab 05/21/18 1129 05/22/18 0603 05/23/18 0600 05/24/18 0422  NA 139 142 140  141 138  K 5.3* 4.8 5.6*  5.6* 3.1*  CL 94* 93* 90*  90* 89*  CO2 39* 43* 42*  44* 39*  GLUCOSE 154* 143* 158*  161* 121*  BUN 28* 25* 39*  39* 48*  CREATININE 0.97 0.93 1.67*  1.59* 1.19*  CALCIUM 9.3 8.9 9.0  9.1 8.5*   Liver Function Tests: Recent Labs  Lab 05/23/18 0600  AST 15  ALT 18  ALKPHOS 42  BILITOT 0.7  PROT 6.5  ALBUMIN 3.8   No results for input(s): LIPASE, AMYLASE in the last 168 hours. Recent Labs  Lab 05/23/18 0600  AMMONIA 22   Coagulation Profile: No results for input(s): INR, PROTIME in the last 168 hours. CBC: Recent Labs  Lab 05/21/18 1129 05/23/18 0600 05/24/18 0422  WBC 8.8 9.6 6.5  NEUTROABS 6.4 8.0*  --   HGB 10.2* 9.4* 8.0*  HCT 35.0* 34.5* 27.3*  MCV 97.8 102.4* 97.2  PLT 200 223 171   Cardiac Enzymes: Recent Labs  Lab 05/21/18 1129 05/23/18 0600  TROPONINI <0.03 <0.03   BNP: Invalid input(s): POCBNP CBG: Recent Labs  Lab 05/21/18 1614  GLUCAP 129*   HbA1C: Recent Labs    05/22/18 0603  HGBA1C 6.0*   Urine analysis:    Component Value Date/Time   COLORURINE  YELLOW 05/23/2018 1250   APPEARANCEUR HAZY (A) 05/23/2018 1250   LABSPEC 1.018 05/23/2018 1250   PHURINE 5.0 05/23/2018 1250   GLUCOSEU NEGATIVE 05/23/2018 1250   HGBUR NEGATIVE 05/23/2018 1250   BILIRUBINUR NEGATIVE 05/23/2018 1250   KETONESUR 5 (A) 05/23/2018 1250   PROTEINUR 30 (A)  05/23/2018 1250   NITRITE NEGATIVE 05/23/2018 1250   LEUKOCYTESUR NEGATIVE 05/23/2018 1250   Sepsis Labs: @LABRCNTIP (procalcitonin:4,lacticidven:4) ) Recent Results (from the past 240 hour(s))  Culture, body fluid-bottle     Status: None (Preliminary result)   Collection Time: 05/21/18  3:05 PM  Result Value Ref Range Status   Specimen Description   Final    THORACENTESIS Performed at [facility], [address] [place], [place] [address]    Special Requests   Final    NONE Performed at [facility], [address] [place], [place] [address]    Gram Stain   Final    GRAM POSITIVE COCCI GRAM NEGATIVE RODS Gram Stain Report Called to,Read Back By and Verified With: MURPHY @1121  ON [id number] BY HENDERSON L. Performed at [facility], [address] [place], [place] [address]    Culture   Final    CULTURE REINCUBATED FOR BETTER GROWTH Performed at [facility], [address] [place], [place] [address]    Report Status PENDING  Incomplete  Culture, blood (Routine X 2) w Reflex to ID Panel     Status: None (Preliminary result)   Collection Time: 05/23/18  6:00 AM  Result Value Ref Range Status   Specimen Description   Final    LEFT ANTECUBITAL BOTTLES DRAWN AEROBIC AND ANAEROBIC   Special Requests Blood Culture adequate volume  Final   Culture   Final    NO GROWTH 1 DAY Performed at [facility], [address], [place], [place] [address]    Report Status PENDING  Incomplete  Culture, blood (Routine X 2) w Reflex to ID Panel     Status: None (Preliminary result)   Collection Time: 05/23/18  8:45 AM  Result Value Ref Range Status   Specimen Description   Final    RIGHT ANTECUBITAL BOTTLES DRAWN AEROBIC AND ANAEROBIC   Special Requests Blood Culture adequate volume  Final   Culture   Final    NO GROWTH < 24 HOURS Performed at [facility], [address], [place], [place] [address]    Report Status PENDING  Incomplete  MRSA PCR Screening     Status: None    Collection Time: 05/23/18  1:00 PM  Result Value Ref Range Status   MRSA by PCR NEGATIVE NEGATIVE Final    Comment:        The GeneXpert MRSA Assay (FDA approved for NASAL specimens only), is one component of a comprehensive MRSA colonization surveillance program. It is not intended to diagnose MRSA infection nor to guide or monitor treatment for MRSA infections. Performed at [facility], [address] [place], [place] [address]      Scheduled Meds: . apixaban  5 mg Oral BID  . bisoprolol  2.5 mg Oral Daily  . budesonide (PULMICORT) nebulizer solution  0.5 mg Nebulization BID  . chlorhexidine  15 mL Mouth Rinse BID  . ipratropium-albuterol  3 mL Nebulization Q6H  . mouth rinse  15 mL Mouth Rinse q12n4p  . methylPREDNISolone (SOLU-MEDROL) injection  60 mg Intravenous Daily  . sodium chloride flush  3 mL Intravenous Q12H   Continuous Infusions: . sodium chloride Stopped (05/23/18 1100)  .  ceFEPime (MAXIPIME) IV Stopped (05/24/18 1610)  . famotidine (PEPCID) IV Stopped (05/24/18 0915)  . vancomycin 750 mg (05/24/18 0919)    Procedures/Studies: Dg Chest 1 View  Result Date: 05/21/2018 CLINICAL DATA:  LEFT pleural effusion post thoracentesis EXAM: CHEST  1 VIEW COMPARISON:  Earlier exam of 05/21/2018 FINDINGS: Stable LEFT subclavian pacemaker and cardiac enlargement. Atherosclerotic calcification aorta. Persistent LEFT basilar opacity by atelectasis and effusion. No pneumothorax following thoracentesis. Remaining lungs clear. Bones demineralized. IMPRESSION: No pneumothorax following LEFT thoracentesis. Electronically Signed   By: Ulyses Southward M.D.   On: 05/21/2018 15:59   Dg Chest 1 View  Result Date: 05/21/2018 CLINICAL DATA:  Lateral radiograph requested.  Query pneumonia. EXAM: CHEST  1 VIEW COMPARISON:  AP radiograph done earlier. : The heart remains enlarged. Retrocardiac density likely effusion and atelectasis, no definite consolidation, is increased from 05/10/2018.  IMPRESSION: Worsening aeration. Electronically Signed   By: Elsie Stain M.D.   On: 05/21/2018 12:48   Dg Chest 2 View  Result Date: 05/10/2018 CLINICAL DATA:  One-week history of progressive shortness of breath. Former smoker with 80 pack-year history. Current history of COPD, hypertension, diabetes and COPD. EXAM: CHEST - 2 VIEW COMPARISON:  05/01/2018, 06/27/2016 and earlier. FINDINGS: Cardiac silhouette moderately enlarged, unchanged dating back to 2011. LEFT subclavian dual lead transvenous pacemaker unchanged and intact. Thoracic aorta atherosclerotic, unchanged. Hilar and mediastinal contours otherwise unremarkable. Pulmonary venous hypertension without overt edema. Hyperinflation, mildly prominent bronchovascular markings and mild central peribronchial thickening, unchanged. Small LEFT pleural effusion, unchanged since the most recent prior examination 9 days ago. No new pulmonary parenchymal abnormalities. Degenerative changes and DISH involving the thoracic spine. Severe degenerative changes involving the LEFT shoulder joint with associated calcified loose bodies. IMPRESSION: 1. Small LEFT pleural effusion, unchanged since the chest x-ray 9 days ago. No acute cardiopulmonary disease otherwise. 2. Stable cardiomegaly without evidence of pulmonary edema. 3.  Emphysema (ICD10-J43.9). Electronically Signed   By: Hulan Saas M.D.   On: 05/10/2018 13:01   Ct Head Wo Contrast  Result Date: 05/22/2018 CLINICAL DATA:  84 y/o F; Altered level of consciousness (LOC), unexplained. EXAM: CT HEAD WITHOUT CONTRAST TECHNIQUE: Contiguous axial images were obtained from the base of the skull through the vertex without intravenous contrast. COMPARISON:  06/21/2016 CT head FINDINGS: Brain: No evidence of acute infarction, hemorrhage, hydrocephalus, extra-axial collection or mass lesion/mass effect. Mild chronic microvascular ischemic changes and volume loss of the brain for age. Vascular: Calcific atherosclerosis  of carotid siphons. No hyperdense vessel identified. Skull: Normal. Negative for fracture or focal lesion. Sinuses/Orbits: No acute finding. Other: None. IMPRESSION: 1. No acute intracranial abnormality identified. 2. Mild chronic microvascular ischemic changes and volume loss of the brain for age. Electronically Signed   By: Mitzi Hansen M.D.   On: 05/22/2018 23:45   US Venous Img Lower Bilateral  Result Date: 05/22/2018 CLINICAL DATA:  84 year old female with bilateral lower extremity pain and shortness of breath EXAM: BILATERAL LOWER EXTREMITY VENOUS DOPPLER ULTRASOUND TECHNIQUE: Gray-scale sonography with graded compression, as well as color Doppler and duplex ultrasound were performed to evaluate the lower extremity deep venous systems from the level of the common femoral vein and including the common femoral, femoral, profunda femoral, popliteal and calf veins including the posterior tibial, peroneal and gastrocnemius veins when visible. The superficial great saphenous vein was also interrogated. Spectral Doppler was utilized to evaluate flow at rest and with distal augmentation maneuvers in the common femoral, femoral and popliteal veins. COMPARISON:  None. FINDINGS:  RIGHT LOWER EXTREMITY Common Femoral Vein: No evidence of thrombus. Normal compressibility, respiratory phasicity and response to augmentation. Saphenofemoral Junction: No evidence of thrombus. Normal compressibility and flow on color Doppler imaging. Profunda Femoral Vein: No evidence of thrombus. Normal compressibility and flow on color Doppler imaging. Femoral Vein: No evidence of thrombus. Normal compressibility, respiratory phasicity and response to augmentation. Popliteal Vein: No evidence of thrombus. Normal compressibility, respiratory phasicity and response to augmentation. Calf Veins: No evidence of thrombus. Normal compressibility and flow on color Doppler imaging. Superficial Great Saphenous Vein: No evidence of  thrombus. Normal compressibility. Venous Reflux:  None. Other Findings:  None. LEFT LOWER EXTREMITY Common Femoral Vein: No evidence of thrombus. Normal compressibility, respiratory phasicity and response to augmentation. Saphenofemoral Junction: No evidence of thrombus. Normal compressibility and flow on color Doppler imaging. Profunda Femoral Vein: No evidence of thrombus. Normal compressibility and flow on color Doppler imaging. Femoral Vein: No evidence of thrombus. Normal compressibility, respiratory phasicity and response to augmentation. Popliteal Vein: No evidence of thrombus. Normal compressibility, respiratory phasicity and response to augmentation. Calf Veins: No evidence of thrombus. Normal compressibility and flow on color Doppler imaging. Superficial Great Saphenous Vein: No evidence of thrombus. Normal compressibility. Venous Reflux:  None. Other Findings:  None. IMPRESSION: 1. No evidence of deep venous thrombosis in either lower extremity. 2. Increased pulsatility of the venous waveforms bilaterally. Similar findings can be seen in the setting of elevated right heart pressures. Common differential considerations include tricuspid regurgitation, right-sided heart failure, pulmonary arterial hypertension and long-standing COPD. Electronically Signed   By: Malachy MoanHeath  McCullough M.D.   On: 05/22/2018 12:45   Dg Chest Port 1 View  Result Date: 05/23/2018 CLINICAL DATA:  Status post PICC line placement EXAM: PORTABLE CHEST 1 VIEW COMPARISON:  05/23/2018 FINDINGS: Cardiac shadow is stable. Pacing device is again seen. New right-sided PICC line is noted at the cavoatrial junction. Bilateral pleural effusions are noted. IMPRESSION: PICC line in satisfactory position. The remainder of the exam is stable. Electronically Signed   By: Alcide CleverMark  Lukens M.D.   On: 05/23/2018 12:04   Dg Chest Port 1 View  Result Date: 05/23/2018 CLINICAL DATA:  Hypoxia. EXAM: PORTABLE CHEST 1 VIEW COMPARISON:  Radiographs 2 days ago  05/21/2018 FINDINGS: Left-sided pacemaker remains in place. Increasing [place] bibasilar opacity likely combination of pleural fluid and atelectasis. The heart is enlarged. Unchanged mediastinal contours from prior exam. Increasing vascular congestion. No pneumothorax. IMPRESSION: Increasing hazy opacity at the lung bases, left greater than right, likely pleural fluid and adjacent atelectasis. Increasing vascular congestion. Electronically Signed   By: Rubye OaksMelanie  Ehinger M.D.   On: 05/23/2018 06:52   Dg Chest Portable 1 View  Result Date: 05/21/2018 CLINICAL DATA:  Short of breath and weakness. EXAM: PORTABLE CHEST 1 VIEW COMPARISON:  05/10/2018 FINDINGS: Cardiac enlargement with dual lead pacemaker. Negative for heart failure. Mild left lower lobe airspace disease and probable small left effusion unchanged from the prior study. Right lung remains clear. IMPRESSION: Mild left lower lobe airspace disease and probable left effusion unchanged. This area is difficult to evaluate on portable image given the large cardiac silhouette. Electronically Signed   By: Marlan Palauharles  Clark M.D.   On: 05/21/2018 11:45   Dg Chest Port 1 View  Result Date: 05/01/2018 CLINICAL DATA:  Shortness of breath and chest pain EXAM: PORTABLE CHEST 1 VIEW COMPARISON:  Chest radiograph 06/27/2016 FINDINGS: There is a small left pleural effusion with associated atelectasis. Cardiomegaly is unchanged. Left chest wall pacemaker with leads in  expected position. No pulmonary edema or focal consolidation. IMPRESSION: Small no pulmonary edema. Left pleural effusion with associated basilar atelectasis. Electronically Signed   By: Deatra Robinson M.D.   On: 05/01/2018 06:42   US Thoracentesis Asp Pleural Space W/img Guide  Result Date: 05/21/2018 INDICATION: LEFT pleural effusion EXAM: ULTRASOUND GUIDED DIAGNOSTIC LEFT THORACENTESIS MEDICATIONS: None. COMPLICATIONS: None immediate. PROCEDURE: Procedure, benefits, and risks of procedure were discussed with  patient. Written informed consent for procedure was obtained. Time out protocol followed. Pleural effusion localized by ultrasound at the posterior LEFT hemithorax. Skin prepped and draped in usual sterile fashion. Skin and soft tissues anesthetized with 10 ML of 1% lidocaine. 8 French thoracentesis catheter placed into the LEFT pleural space. 300 mL of clear serous fluid aspirated by syringe pump. Procedure tolerated well by patient without immediate complication. FINDINGS: A total of approximately 300 mL of LEFT pleural fluid was removed. Samples were sent to the laboratory as requested by the clinical team. IMPRESSION: Successful ultrasound guided LEFT thoracentesis yielding 300 mL of pleural fluid. Electronically Signed   By: Ulyses Southward M.D.   On: 05/21/2018 15:57    Catarina Hartshorn, DO  [facility] Pager [phone]  If 7PM-7AM, please contact night-coverage [email or web address] Password [email or web address] 05/24/2018, 9:52 AM   LOS: 3 days

## 2018-05-25 DIAGNOSIS — J869 Pyothorax without fistula: Secondary | ICD-10-CM

## 2018-05-25 DIAGNOSIS — A420 Pulmonary actinomycosis: Secondary | ICD-10-CM

## 2018-05-25 LAB — BASIC METABOLIC PANEL
ANION GAP: 7 (ref 5–15)
BUN: 38 mg/dL — AB (ref 8–23)
CO2: 40 mmol/L — AB (ref 22–32)
Calcium: 8.6 mg/dL — ABNORMAL LOW (ref 8.9–10.3)
Chloride: 93 mmol/L — ABNORMAL LOW (ref 98–111)
Creatinine, Ser: 0.85 mg/dL (ref 0.44–1.00)
GFR calc non Af Amer: >60 mL/min (ref >60)
Glucose, Bld: 115 mg/dL — ABNORMAL HIGH (ref 70–99)
Potassium: 2.8 mmol/L — ABNORMAL LOW (ref 3.5–5.1)
Sodium: 140 mmol/L (ref 135–145)

## 2018-05-25 LAB — CBC
HCT: 25.5 % — ABNORMAL LOW (ref 36.0–46.0)
HEMOGLOBIN: 7.7 g/dL — AB (ref 12.0–15.0)
MCH: 28.2 pg (ref 26.0–34.0)
MCHC: 30.2 g/dL (ref 30.0–36.0)
MCV: 93.4 fL (ref 78.0–100.0)
PLATELETS: 153 10*3/uL (ref 150–400)
RBC: 2.73 MIL/uL — AB (ref 3.87–5.11)
RDW: 14.4 % (ref 11.5–15.5)
WBC: 4.5 10*3/uL (ref 4.0–10.5)

## 2018-05-25 LAB — URINE CULTURE: Culture: <10000 — AB

## 2018-05-25 LAB — HEPARIN LEVEL (UNFRACTIONATED): HEPARIN UNFRACTIONATED: 1.16 [IU]/mL — AB (ref 0.30–0.70)

## 2018-05-25 LAB — PROCALCITONIN

## 2018-05-25 LAB — APTT: aPTT: 96 seconds — ABNORMAL HIGH (ref 24–36)

## 2018-05-25 MED ORDER — POTASSIUM CHLORIDE 10 MEQ/100ML IV SOLN
10.0000 meq | INTRAVENOUS | Status: AC
  Administered 2018-05-25 (×4): 10 meq via INTRAVENOUS
  Filled 2018-05-25 (×4): qty 100

## 2018-05-25 MED ORDER — POTASSIUM CHLORIDE CRYS ER 20 MEQ PO TBCR
40.0000 meq | EXTENDED_RELEASE_TABLET | ORAL | Status: AC
  Administered 2018-05-25 (×2): 40 meq via ORAL
  Filled 2018-05-25 (×2): qty 2

## 2018-05-25 MED ORDER — LORAZEPAM 2 MG/ML IJ SOLN
0.5000 mg | Freq: Every evening | INTRAMUSCULAR | Status: DC | PRN
  Administered 2018-05-25 – 2018-06-09 (×16): 0.5 mg via INTRAVENOUS
  Filled 2018-05-25 (×17): qty 1

## 2018-05-25 MED ORDER — SODIUM CHLORIDE 0.9 % IV SOLN
2.0000 g | INTRAVENOUS | Status: DC
  Administered 2018-05-25 – 2018-05-28 (×4): 2 g via INTRAVENOUS
  Filled 2018-05-25: qty 2
  Filled 2018-05-25: qty 20
  Filled 2018-05-25: qty 2
  Filled 2018-05-25: qty 20
  Filled 2018-05-25 (×2): qty 2

## 2018-05-25 MED ORDER — FUROSEMIDE 40 MG PO TABS
40.0000 mg | ORAL_TABLET | Freq: Every day | ORAL | Status: DC
  Administered 2018-05-25 – 2018-06-05 (×12): 40 mg via ORAL
  Filled 2018-05-25 (×12): qty 1

## 2018-05-25 NOTE — Progress Notes (Signed)
eLink Physician-Brief Progress Note Patient Name: Simone CuriaVirginia L Marinello DOB: 03/23/1931 MRN: [id number]   Date of Service  05/25/2018  HPI/Events of Note  hypokalemia  eICU Interventions  replace     Intervention Category Minor Interventions: Electrolytes abnormality - evaluation and management  Justice Milliron 05/25/2018, 6:00 AM

## 2018-05-25 NOTE — Progress Notes (Addendum)
PROGRESS NOTE  [name] ZOX:[id number] DOB: December 04, 1930 DOA: 05/21/2018 PCP: Benita Stabile, MD  Brief History: 84 y.o.femalewith medical history ofCOPD, systolic and diastolic CHF, hypertension, paroxysmal atrial for ablation, sinus node dysfunction, diabetes mellitus, coronary artery disease presenting with 1 month history of shortness of breath that has significantly worsened over the past 2 days. The patient visited the emergency department on 05/01/2018. She was discharged from the emergency department with prednisone for COPD exacerbation. There was no improvement with prednisone. She will follow-up with her primary care provider. She was given a diagnosis of pneumonia and started on antibiotics which she has completed at least 1 week prior to this admission. Unfortunately, the patient shortness of breath continued to worsen. The patient endorses compliance with all her medications.  She denied fevers, chills. The patient states that she has had some increase in her lower extremity edema as well as increasing abdominal girth. She also notes some PND type symptoms at nighttime. In the emergency department, the patient was afebrile hemodynamically stable saturating 100% on 2.5 L. BNP was 787. Potassium is 5.3. Chest x-ray showed a left pleural effusion which is slightly worse.She was  initially started on IV lasix with clinical improvement. The patient underwent thoracocentesis on 05/21/18 removing 300cc fluid.  Overnight from 05/22/2018-05/23/2018, the patient became somnolent and difficult to arouse. CT of the brain was negative. ABG suggests the patient was hypercarbic. She was placed on BiPAP and transferred to the stepdown unit.  She was started on vancomycin and cefepime due to concerns for pneumonia and pleural fluid results with 1035 WBCs.  Later on 05/23/18, pleural fluid culture became positive with gram stain GNR and GPC.    Assessment/Plan: Acute respiratory  failure with hypoxia and hypercarbia -Multifactorial including CHF,HCAP in the setting of COPD -pt has ~50 pack years -placed on  BiPAP 05/23/18>>>now stable on 2L  Acute on chronic systolicanddiastolic CHF -daily weights -accurate I/O's--incomplete -fluid restrict -Echo--EF 45%, AK basal inferior, mild TR, moderate MR, mild left ear, severe decreased RV function -now appears clinically euvolemic after IV lasix -restart po lasix  HCAP/Exudative pleural effusion/Actinomycosis -started vancomycin and cefepime initially -checkprocalcitonin <0.10 -Lactic acid peaked 1.3 -MRSA screen--neg -personally reviewed CXR--vascular congestion, increase L>R pleural effusion -continue vanco abx pending final pleural fluid culture -d/c cefepime -start ceftriaxone 2 grams daily  LeftPleural effusion--Exudative/Empyema -05/21/18--left thoracocentesis--300 cc removed -concerned about empyema--prelim culture = actinomyces and CoNS -ExudativeLights' criteria -contact thoracic surgery about possible need for surgery->discussed with Dr. Zenaida Niece Trigt-->pleural effusions are small on CT-->poor surgical candidate-->recommends pigtail catheter drainage -IR drainage/pigtail scheduled for 8/8//19  Acute metabolic encephalopathy -Multifactorial including respiratory failure, acute on chronic renal failure, infectious process -At baseline, the patient is alert and oriented x4 and able to perform all her activities of daily living -Improving on BiPAP>>>weaned to 2L -Ammonia 22 -Check UA--neg for pyuria -discontinue ativan -Mental status now back to basdeline  Paroxysmal atrial fibrillation -Continue apixaban -Continue bisoprolol  COPDExacerbation -started duonebs -started pulmicort -wheezing improved -start IV solumedrol>>>prednisone taper  Acute on chronic renal failure-CKD stage 3 -due to IV diuresis -serum creatinine peaked 1.67 -baseline creatinine 0.8-1.0 -improved with holding lasix  IV  Diabetes mellitus type 2 -Appears to be diet-controlled in the outpatient setting -Hemoglobin A1c--6.0 -NovoLog sliding scale  Sinus node dysfunction -Status post PPM  Coronary artery disease -No chest pain presently -Continue bisoprolol  Hyperkalemia -place on tele -improvement with diuresis IV -A.m. BMP -kayexalate x 1-->resoloved  Goals of care -Advance care planning, including the explanation and discussion of advance directives was carried out with the patient and family. Code status including explanations of "Full Code" and "DNR" and alternatives were discussed in detail. Discussion of end-of-life issues including but not limited palliative care, hospice care and the concept of hospice, other end-of-life care options, power of attorney for health care decisions, living wills, and physician orders for life-sustaining treatment were also discussed with the patient and family.  -DNR, but daughter still wants full scope of care     Disposition Plan:remain in SDU Family Communication:Daughter updatedat bedside 8/6--The patient is critically ill with multiple organ systems failure and requires high complexity decision making for assessment and support, frequent evaluation and titration of therapies, application of advanced monitoring technologies and extensive interpretation of multiple databases.  Critical care time - .   Consultants:none  Code Status: DNR  DVT Prophylaxis:apixaban   Procedures: As Listed in Progress Note Above  Antibiotics: vanco 8/4>>> Cefepime 8/4>>>8/6 Ceftriaxone 8/6>>>    Subjective: Patient denies fevers, chills, headache, chest pain, dyspnea, nausea, vomiting, diarrhea, abdominal pain, dysuria, hematuria, hematochezia, and melena.   Objective: Vitals:   05/25/18 0900 05/25/18 0930 05/25/18 1240 05/25/18 1300  BP: (!) 108/53 106/66 (!) 103/59   Pulse: 99 94 97   Resp: (!) 22 (!) 23 (!) 25     Temp:    98.4 F (36.9 C)  TempSrc:    Oral  SpO2: 93% 96% 97%   Weight:      Height:        Intake/Output Summary (Last 24 hours) at 05/25/2018 1429 Last data filed at 05/25/2018 0908 Gross per 24 hour  Intake 690.83 ml  Output 725 ml  Net -34.17 ml   Weight change: 0.1 kg (3.5 oz) Exam:   General:  Pt is alert, follows commands appropriately, not in acute distress  HEENT: No icterus, No thrush, No neck mass, [place]/AT  Cardiovascular: RRR, S1/S2, no rubs, no gallops  Respiratory: diminished BS at bases; bibasilar crackles. No wheeze  Abdomen: Soft/+BS, non tender, non distended, no guarding  Extremities: No edema, No lymphangitis, No petechiae, No rashes, no synovitis   Data Reviewed: I have personally reviewed following labs and imaging studies Basic Metabolic Panel: Recent Labs  Lab 05/21/18 1129 05/22/18 0603 05/23/18 0600 05/24/18 0422 05/25/18 0423  NA 139 142 140  141 138 140  K 5.3* 4.8 5.6*  5.6* 3.1* 2.8*  CL 94* 93* 90*  90* 89* 93*  CO2 39* 43* 42*  44* 39* 40*  GLUCOSE 154* 143* 158*  161* 121* 115*  BUN 28* 25* 39*  39* 48* 38*  CREATININE 0.97 0.93 1.67*  1.59* 1.19* 0.85  CALCIUM 9.3 8.9 9.0  9.1 8.5* 8.6*   Liver Function Tests: Recent Labs  Lab 05/23/18 0600  AST 15  ALT 18  ALKPHOS 42  BILITOT 0.7  PROT 6.5  ALBUMIN 3.8   No results for input(s): LIPASE, AMYLASE in the last 168 hours. Recent Labs  Lab 05/23/18 0600  AMMONIA 22   Coagulation Profile: No results for input(s): INR, PROTIME in the last 168 hours. CBC: Recent Labs  Lab 05/21/18 1129 05/23/18 0600 05/24/18 0422 05/25/18 0423  WBC 8.8 9.6 6.5 4.5  NEUTROABS 6.4 8.0*  --   --   HGB 10.2* 9.4* 8.0* 7.7*  HCT 35.0* 34.5* 27.3* 25.5*  MCV 97.8 102.4* 97.2 93.4  PLT 200 223 171 153   Cardiac Enzymes: Recent Labs  Lab  05/21/18 1129 05/23/18 0600  TROPONINI <0.03 <0.03   BNP: Invalid input(s): POCBNP CBG: Recent Labs  Lab 05/21/18 1614  GLUCAP  129*   HbA1C: No results for input(s): HGBA1C in the last 72 hours. Urine analysis:    Component Value Date/Time   COLORURINE YELLOW 05/23/2018 1250   APPEARANCEUR HAZY (A) 05/23/2018 1250   LABSPEC 1.018 05/23/2018 1250   PHURINE 5.0 05/23/2018 1250   GLUCOSEU NEGATIVE 05/23/2018 1250   HGBUR NEGATIVE 05/23/2018 1250   BILIRUBINUR NEGATIVE 05/23/2018 1250   KETONESUR 5 (A) 05/23/2018 1250   PROTEINUR 30 (A) 05/23/2018 1250   NITRITE NEGATIVE 05/23/2018 1250   LEUKOCYTESUR NEGATIVE 05/23/2018 1250   Sepsis Labs: @LABRCNTIP (procalcitonin:4,lacticidven:4) ) Recent Results (from the past 240 hour(s))  Culture, body fluid-bottle     Status: Abnormal (Preliminary result)   Collection Time: 05/21/18  3:05 PM  Result Value Ref Range Status   Specimen Description   Final    THORACENTESIS Performed at [facility], [address], [place], [place] [address]    Special Requests   Final    BOTTLES DRAWN AEROBIC AND ANAEROBIC 10CC Performed at [facility], [address], [place], [place] [address]    Gram Stain   Final    GRAM POSITIVE COCCI GRAM VARIABLE ROD Gram Stain Report Called to,Read Back By and Verified With: MURPHY @1121  ON [id number] BY HENDERSON L.    Culture (A)  Final    STAPHYLOCOCCUS SPECIES (COAGULASE NEGATIVE) ACTINOMYCES SPECIES Standardized susceptibility testing for this organism is not available. Performed at [facility], [address], [place], [place] [address]    Report Status PENDING  Incomplete  Culture, blood (Routine X 2) w Reflex to ID Panel     Status: None (Preliminary result)   Collection Time: 05/23/18  6:00 AM  Result Value Ref Range Status   Specimen Description   Final    LEFT ANTECUBITAL BOTTLES DRAWN AEROBIC AND ANAEROBIC   Special Requests Blood Culture adequate volume  Final   Culture   Final    NO GROWTH 2 DAYS Performed at [facility], [address] [place], [place] [address]    Report Status PENDING  Incomplete  Culture,  blood (Routine X 2) w Reflex to ID Panel     Status: None (Preliminary result)   Collection Time: 05/23/18  8:45 AM  Result Value Ref Range Status   Specimen Description   Final    RIGHT ANTECUBITAL BOTTLES DRAWN AEROBIC AND ANAEROBIC   Special Requests Blood Culture adequate volume  Final   Culture   Final    NO GROWTH 2 DAYS Performed at [facility], [address], [place], [place] [address]    Report Status PENDING  Incomplete  Culture, Urine     Status: Abnormal   Collection Time: 05/23/18 12:50 PM  Result Value Ref Range Status   Specimen Description   Final    URINE, CLEAN CATCH Performed at [facility], [address], [place], [place] [address]    Special Requests   Final    NONE Performed at [facility], [address] [place], [place] [address]    Culture (A)  Final    <10,000 COLONIES/mL INSIGNIFICANT GROWTH Performed at [facility], [address] [place], [place] [address]    Report Status 05/25/2018 FINAL  Final  MRSA PCR Screening     Status: None   Collection Time: 05/23/18  1:00 PM  Result Value Ref Range Status  MRSA by PCR NEGATIVE NEGATIVE Final    Comment:        The GeneXpert MRSA Assay (FDA approved for NASAL specimens only), is one component of a comprehensive MRSA colonization surveillance program. It is not intended to diagnose MRSA infection nor to guide or monitor treatment for MRSA infections. Performed at [facility], [address], [place], [place] [address]      Scheduled Meds: . bisoprolol  2.5 mg Oral Daily  . budesonide (PULMICORT) nebulizer solution  0.5 mg Nebulization BID  . chlorhexidine  15 mL Mouth Rinse BID  . ipratropium-albuterol  3 mL Nebulization Q6H  . mouth rinse  15 mL Mouth Rinse q12n4p  . predniSONE  50 mg Oral Q breakfast  . sodium chloride flush  3 mL Intravenous Q12H   Continuous Infusions: . sodium chloride Stopped (05/23/18 1100)  . cefTRIAXone (ROCEPHIN)  IV    . famotidine (PEPCID) IV  Stopped (05/25/18 1034)  . heparin 1,000 Units/hr (05/24/18 2153)  . vancomycin Stopped (05/25/18 1140)    Procedures/Studies: Dg Chest 1 View  Result Date: 05/21/2018 CLINICAL DATA:  LEFT pleural effusion post thoracentesis EXAM: CHEST  1 VIEW COMPARISON:  Earlier exam of 05/21/2018 FINDINGS: Stable LEFT subclavian pacemaker and cardiac enlargement. Atherosclerotic calcification aorta. Persistent LEFT basilar opacity by atelectasis and effusion. No pneumothorax following thoracentesis. Remaining lungs clear. Bones demineralized. IMPRESSION: No pneumothorax following LEFT thoracentesis. Electronically Signed   By: Ulyses Southward M.D.   On: 05/21/2018 15:59   Dg Chest 1 View  Result Date: 05/21/2018 CLINICAL DATA:  Lateral radiograph requested.  Query pneumonia. EXAM: CHEST  1 VIEW COMPARISON:  AP radiograph done earlier. : The heart remains enlarged. Retrocardiac density likely effusion and atelectasis, no definite consolidation, is increased from 05/10/2018. IMPRESSION: Worsening aeration. Electronically Signed   By: Elsie Stain M.D.   On: 05/21/2018 12:48   Dg Chest 2 View  Result Date: 05/10/2018 CLINICAL DATA:  One-week history of progressive shortness of breath. Former smoker with 80 pack-year history. Current history of COPD, hypertension, diabetes and COPD. EXAM: CHEST - 2 VIEW COMPARISON:  05/01/2018, 06/27/2016 and earlier. FINDINGS: Cardiac silhouette moderately enlarged, unchanged dating back to 2011. LEFT subclavian dual lead transvenous pacemaker unchanged and intact. Thoracic aorta atherosclerotic, unchanged. Hilar and mediastinal contours otherwise unremarkable. Pulmonary venous hypertension without overt edema. Hyperinflation, mildly prominent bronchovascular markings and mild central peribronchial thickening, unchanged. Small LEFT pleural effusion, unchanged since the most recent prior examination 9 days ago. No new pulmonary parenchymal abnormalities. Degenerative changes and DISH  involving the thoracic spine. Severe degenerative changes involving the LEFT shoulder joint with associated calcified loose bodies. IMPRESSION: 1. Small LEFT pleural effusion, unchanged since the chest x-ray 9 days ago. No acute cardiopulmonary disease otherwise. 2. Stable cardiomegaly without evidence of pulmonary edema. 3.  Emphysema (ICD10-J43.9). Electronically Signed   By: Hulan Saas M.D.   On: 05/10/2018 13:01   Ct Head Wo Contrast  Result Date: 05/22/2018 CLINICAL DATA:  84 y/o F; Altered level of consciousness (LOC), unexplained. EXAM: CT HEAD WITHOUT CONTRAST TECHNIQUE: Contiguous axial images were obtained from the base of the skull through the vertex without intravenous contrast. COMPARISON:  06/21/2016 CT head FINDINGS: Brain: No evidence of acute infarction, hemorrhage, hydrocephalus, extra-axial collection or mass lesion/mass effect. Mild chronic microvascular ischemic changes and volume loss of the brain for age. Vascular: Calcific atherosclerosis of carotid siphons. No hyperdense vessel identified. Skull: Normal. Negative for fracture or focal lesion. Sinuses/Orbits: No acute finding. Other: None. IMPRESSION:  1. No acute intracranial abnormality identified. 2. Mild chronic microvascular ischemic changes and volume loss of the brain for age. Electronically Signed   By: Mitzi Hansen M.D.   On: 05/22/2018 23:45   Ct Chest Wo Contrast  Result Date: 05/24/2018 CLINICAL DATA:  Follow-up pleural effusion. EXAM: CT CHEST WITHOUT CONTRAST TECHNIQUE: Multidetector CT imaging of the chest was performed following the standard protocol without IV contrast. COMPARISON:  Chest x-ray dated 05/23/2018. Chest CT dated 08/31/2003. FINDINGS: Cardiovascular: Cardiomegaly. No pericardial effusion. Extensive aortic atherosclerosis. No thoracic aortic aneurysm. Three-vessel coronary artery calcifications. Mediastinum/Nodes: No mass or enlarged lymph nodes seen within the mediastinum or perihilar  regions. Esophagus is unremarkable. Trachea and central bronchi are unremarkable. Lungs/Pleura: Bilateral pleural effusions, small to moderate in size, RIGHT greater than LEFT. Chronic scarring/atelectasis at the LEFT lung base. Chronic pleural based nodule along the lateral margin of the RIGHT lung, near the junction of the major and minor fissures. No evidence of pneumonia. No pneumothorax. Upper Abdomen: No acute findings. Musculoskeletal: Degenerative changes throughout the slightly scoliotic thoracolumbar spine, mild to moderate in degree. No acute or suspicious osseous finding. Presumed chronic sebaceous cyst within the superficial soft tissues of the lower RIGHT back. Superficial soft tissues of the chest are otherwise unremarkable. IMPRESSION: 1. Bilateral pleural effusions, small to moderate in size, RIGHT greater than LEFT, with associated atelectasis. 2. No other acute appearing findings. 3. Cardiomegaly. 4. Diffuse coronary artery calcifications. Aortic Atherosclerosis (ICD10-I70.0). Electronically Signed   By: Bary Richard M.D.   On: 05/24/2018 13:43   US Venous Img Lower Bilateral  Result Date: 05/22/2018 CLINICAL DATA:  84 year old female with bilateral lower extremity pain and shortness of breath EXAM: BILATERAL LOWER EXTREMITY VENOUS DOPPLER ULTRASOUND TECHNIQUE: Gray-scale sonography with graded compression, as well as color Doppler and duplex ultrasound were performed to evaluate the lower extremity deep venous systems from the level of the common femoral vein and including the common femoral, femoral, profunda femoral, popliteal and calf veins including the posterior tibial, peroneal and gastrocnemius veins when visible. The superficial great saphenous vein was also interrogated. Spectral Doppler was utilized to evaluate flow at rest and with distal augmentation maneuvers in the common femoral, femoral and popliteal veins. COMPARISON:  None. FINDINGS: RIGHT LOWER EXTREMITY Common Femoral  Vein: No evidence of thrombus. Normal compressibility, respiratory phasicity and response to augmentation. Saphenofemoral Junction: No evidence of thrombus. Normal compressibility and flow on color Doppler imaging. Profunda Femoral Vein: No evidence of thrombus. Normal compressibility and flow on color Doppler imaging. Femoral Vein: No evidence of thrombus. Normal compressibility, respiratory phasicity and response to augmentation. Popliteal Vein: No evidence of thrombus. Normal compressibility, respiratory phasicity and response to augmentation. Calf Veins: No evidence of thrombus. Normal compressibility and flow on color Doppler imaging. Superficial Great Saphenous Vein: No evidence of thrombus. Normal compressibility. Venous Reflux:  None. Other Findings:  None. LEFT LOWER EXTREMITY Common Femoral Vein: No evidence of thrombus. Normal compressibility, respiratory phasicity and response to augmentation. Saphenofemoral Junction: No evidence of thrombus. Normal compressibility and flow on color Doppler imaging. Profunda Femoral Vein: No evidence of thrombus. Normal compressibility and flow on color Doppler imaging. Femoral Vein: No evidence of thrombus. Normal compressibility, respiratory phasicity and response to augmentation. Popliteal Vein: No evidence of thrombus. Normal compressibility, respiratory phasicity and response to augmentation. Calf Veins: No evidence of thrombus. Normal compressibility and flow on color Doppler imaging. Superficial Great Saphenous Vein: No evidence of thrombus. Normal compressibility. Venous Reflux:  None. Other Findings:  None. IMPRESSION:  1. No evidence of deep venous thrombosis in either lower extremity. 2. Increased pulsatility of the venous waveforms bilaterally. Similar findings can be seen in the setting of elevated right heart pressures. Common differential considerations include tricuspid regurgitation, right-sided heart failure, pulmonary arterial hypertension and  long-standing COPD. Electronically Signed   By: Malachy MoanHeath  McCullough M.D.   On: 05/22/2018 12:45   Dg Chest Port 1 View  Result Date: 05/23/2018 CLINICAL DATA:  Status post PICC line placement EXAM: PORTABLE CHEST 1 VIEW COMPARISON:  05/23/2018 FINDINGS: Cardiac shadow is stable. Pacing device is again seen. New right-sided PICC line is noted at the cavoatrial junction. Bilateral pleural effusions are noted. IMPRESSION: PICC line in satisfactory position. The remainder of the exam is stable. Electronically Signed   By: Alcide CleverMark  Lukens M.D.   On: 05/23/2018 12:04   Dg Chest Port 1 View  Result Date: 05/23/2018 CLINICAL DATA:  Hypoxia. EXAM: PORTABLE CHEST 1 VIEW COMPARISON:  Radiographs 2 days ago 05/21/2018 FINDINGS: Left-sided pacemaker remains in place. Increasing [place] bibasilar opacity likely combination of pleural fluid and atelectasis. The heart is enlarged. Unchanged mediastinal contours from prior exam. Increasing vascular congestion. No pneumothorax. IMPRESSION: Increasing hazy opacity at the lung bases, left greater than right, likely pleural fluid and adjacent atelectasis. Increasing vascular congestion. Electronically Signed   By: Rubye OaksMelanie  Ehinger M.D.   On: 05/23/2018 06:52   Dg Chest Portable 1 View  Result Date: 05/21/2018 CLINICAL DATA:  Short of breath and weakness. EXAM: PORTABLE CHEST 1 VIEW COMPARISON:  05/10/2018 FINDINGS: Cardiac enlargement with dual lead pacemaker. Negative for heart failure. Mild left lower lobe airspace disease and probable small left effusion unchanged from the prior study. Right lung remains clear. IMPRESSION: Mild left lower lobe airspace disease and probable left effusion unchanged. This area is difficult to evaluate on portable image given the large cardiac silhouette. Electronically Signed   By: Marlan Palauharles  Clark M.D.   On: 05/21/2018 11:45   Dg Chest Port 1 View  Result Date: 05/01/2018 CLINICAL DATA:  Shortness of breath and chest pain EXAM: PORTABLE CHEST 1 VIEW  COMPARISON:  Chest radiograph 06/27/2016 FINDINGS: There is a small left pleural effusion with associated atelectasis. Cardiomegaly is unchanged. Left chest wall pacemaker with leads in expected position. No pulmonary edema or focal consolidation. IMPRESSION: Small no pulmonary edema. Left pleural effusion with associated basilar atelectasis. Electronically Signed   By: Deatra RobinsonKevin  Herman M.D.   On: 05/01/2018 06:42   [place] Thoracentesis Asp Pleural Space W/img Guide  Result Date: 05/21/2018 INDICATION: LEFT pleural effusion EXAM: ULTRASOUND GUIDED DIAGNOSTIC LEFT THORACENTESIS MEDICATIONS: None. COMPLICATIONS: None immediate. PROCEDURE: Procedure, benefits, and risks of procedure were discussed with patient. Written informed consent for procedure was obtained. Time out protocol followed. Pleural effusion localized by ultrasound at the posterior LEFT hemithorax. Skin prepped and draped in usual sterile fashion. Skin and soft tissues anesthetized with 10 ML of 1% lidocaine. 8 French thoracentesis catheter placed into the LEFT pleural space. 300 mL of clear serous fluid aspirated by syringe pump. Procedure tolerated well by patient without immediate complication. FINDINGS: A total of approximately 300 mL of LEFT pleural fluid was removed. Samples were sent to the laboratory as requested by the clinical team. IMPRESSION: Successful ultrasound guided LEFT thoracentesis yielding 300 mL of pleural fluid. Electronically Signed   By: Ulyses SouthwardMark  Boles M.D.   On: 05/21/2018 15:57    Catarina Hartshornavid Teal Bontrager, DO  [facility] Pager [phone]  If 7PM-7AM, please contact night-coverage [email or web address] Password [email or web address] 05/25/2018, 2:29 PM  LOS: 4 days

## 2018-05-25 NOTE — Progress Notes (Addendum)
ANTICOAGULATION CONSULT NOTE - Initial Consult  Pharmacy Consult for heparin Indication: atrial fibrillation  Allergies  Allergen Reactions  . Codeine Nausea Only  . Heparin Other (See Comments)    Causes internal bleeding     Patient Measurements: Height: 5\' 4"  (162.6 cm) Weight: 153 lb 14.1 oz (69.8 kg) IBW/kg (Calculated) : 54.7 Heparin Dosing Weight: 68.8 kg  Vital Signs: Temp: 98 F (36.7 C) (08/06 0400) Temp Source: Axillary (08/06 0400) Pulse Rate: 110 (08/05 2258)  Labs: Recent Labs    05/23/18 0600 05/24/18 0422 05/24/18 1423 05/25/18 0423  HGB 9.4* 8.0*  --  7.7*  HCT 34.5* 27.3*  --  25.5*  PLT 223 171  --  153  APTT  --   --   --  96*  HEPARINUNFRC  --   --  >2.20*  --   CREATININE 1.67*  1.59* 1.19*  --  0.85  TROPONINI <0.03  --   --   --     Estimated Creatinine Clearance: 46.4 mL/min (by C-G formula based on SCr of 0.85 mg/dL).   Medical History: Past Medical History:  Diagnosis Date  . Asthma   . COPD (chronic obstructive pulmonary disease) (HCC)    Oxygen dependent  . Coronary atherosclerosis of native coronary artery   . Essential hypertension, benign   . History of GI bleed    Ulcer  . Hyperlipidemia   . Old inferior wall myocardial infarction 1999   [facility]  . PAF (paroxysmal atrial fibrillation) (HCC)   . Sinus node dysfunction (HCC)    Medtronic PPM  . Type 2 diabetes mellitus (HCC)     Medications:  Medications Prior to Admission  Medication Sig Dispense Refill Last Dose  . apixaban (ELIQUIS) 5 MG TABS tablet Take 1 tablet (5 mg total) by mouth 2 (two) times daily. 60 tablet 0 05/21/2018 at 0830  . bisoprolol (ZEBETA) 5 MG tablet Take 1 tablet (5 mg total) by mouth daily. Pt needs appointment for further refills. (Patient taking differently: Take 2.5 mg by mouth daily. Take one tablet by mouth in the morning and one at night.) 90 tablet 0 05/21/2018 at 0830  . furosemide (LASIX) 40 MG tablet Take 40 mg by mouth daily. Daily at 0800  am   05/20/2018 at Unknown time  . ipratropium-albuterol (DUONEB) 0.5-2.5 (3) MG/3ML SOLN Take 3 mLs by nebulization 3 (three) times daily. (Patient taking differently: Take 3 mLs by nebulization every 6 (six) hours as needed (for shortness of breath). ) 360 mL  Past Month at Unknown time  . LORazepam (ATIVAN) 0.5 MG tablet Take 1 tablet (0.5 mg total) by mouth daily. Take one tablet by mouth once daily (Patient taking differently: Take 0.5 mg by mouth at bedtime. Take one tablet by mouth once daily) 30 tablet 0 05/21/2018 at 1000  . Melatonin 10 MG TABS Take 1 tablet by mouth at bedtime.   05/20/2018 at Unknown time  . Multiple Vitamins-Minerals (CENTRUM SILVER 50+WOMEN PO) Take 1 tablet by mouth daily.   05/21/2018 at 0830  . nitroGLYCERIN (NITROSTAT) 0.4 MG SL tablet Place 1 tablet (0.4 mg total) under the tongue every 5 (five) minutes as needed for chest pain. 25 tablet 3 unknown  . omeprazole (PRILOSEC) 20 MG capsule Take 1 capsule (20 mg total) by mouth 2 (two) times daily before a meal. 60 capsule 11 05/21/2018 at 0830  . OXYGEN Inhale 2.5 L into the lungs continuous.   Taking  . potassium chloride (K-DUR,KLOR-CON) 10 MEQ  tablet Take 10 mEq by mouth 2 (two) times daily.   05/21/2018 at 0830  . vitamin B-12 (CYANOCOBALAMIN) 1000 MCG tablet Take 1,000 mcg by mouth daily.   05/21/2018 at 0830  . escitalopram (LEXAPRO) 20 MG tablet Take 20 mg by mouth at bedtime.    on the way currently in mail order    Assessment: Pharmacy consulted to dose heparin in patient with atrial fibrillation. Patient is currently on apixaban at home with last dose being given 8/5 0917.  Patient has listed allergy to heparin with history of GI bleed ~20 years ago.  MD agrees to continue with heparin and pharmacy will monitor labs and s/s of bleeding.  Will initiate heparin without bolus and be conservative with dosing.  Pharmacy will dose based on aPTT levels until it correlates with heparin levels.  APTT therapeutic at 96.  Goal of  Therapy:  Heparin level 0.3-0.7 units/ml aPTT 66-102 sec seconds Monitor platelets by anticoagulation protocol: Yes   Plan:  Continue heparin infusion at 1000 units/hr Check anti-Xa level and aPTT level daily while on heparin Continue to monitor H&H and platelets.   Judeth CornfieldSteven Navin Dogan, PharmD Clinical Pharmacist 05/25/2018 7:43 AM

## 2018-05-26 LAB — BASIC METABOLIC PANEL
ANION GAP: 5 (ref 5–15)
BUN: 31 mg/dL — AB (ref 8–23)
CALCIUM: 8.5 mg/dL — AB (ref 8.9–10.3)
CO2: 38 mmol/L — ABNORMAL HIGH (ref 22–32)
Chloride: 95 mmol/L — ABNORMAL LOW (ref 98–111)
Creatinine, Ser: 0.82 mg/dL (ref 0.44–1.00)
GFR calc Af Amer: >60 mL/min (ref >60)
GLUCOSE: 113 mg/dL — AB (ref 70–99)
Potassium: 3.7 mmol/L (ref 3.5–5.1)
Sodium: 138 mmol/L (ref 135–145)

## 2018-05-26 LAB — CBC
HCT: 26 % — ABNORMAL LOW (ref 36.0–46.0)
HEMOGLOBIN: 7.9 g/dL — AB (ref 12.0–15.0)
MCH: 28.5 pg (ref 26.0–34.0)
MCHC: 30.4 g/dL (ref 30.0–36.0)
MCV: 93.9 fL (ref 78.0–100.0)
PLATELETS: 165 10*3/uL (ref 150–400)
RBC: 2.77 MIL/uL — AB (ref 3.87–5.11)
RDW: 14.6 % (ref 11.5–15.5)
WBC: 5 10*3/uL (ref 4.0–10.5)

## 2018-05-26 LAB — HEPARIN LEVEL (UNFRACTIONATED)
Heparin Unfractionated: >2.2 IU/mL — ABNORMAL HIGH (ref 0.30–0.70)
Heparin Unfractionated: 0.81 IU/mL — ABNORMAL HIGH (ref 0.30–0.70)

## 2018-05-26 LAB — APTT
aPTT: 131 seconds — ABNORMAL HIGH (ref 24–36)
aPTT: 68 seconds — ABNORMAL HIGH (ref 24–36)

## 2018-05-26 LAB — HEPATIC FUNCTION PANEL
ALT: 17 U/L (ref 0–44)
AST: 14 U/L — AB (ref 15–41)
Albumin: 2.9 g/dL — ABNORMAL LOW (ref 3.5–5.0)
Alkaline Phosphatase: 32 U/L — ABNORMAL LOW (ref 38–126)
BILIRUBIN DIRECT: 0.1 mg/dL (ref 0.0–0.2)
BILIRUBIN TOTAL: 0.8 mg/dL (ref 0.3–1.2)
Indirect Bilirubin: 0.7 mg/dL (ref 0.3–0.9)
Total Protein: 5.2 g/dL — ABNORMAL LOW (ref 6.5–8.1)

## 2018-05-26 MED ORDER — IPRATROPIUM-ALBUTEROL 0.5-2.5 (3) MG/3ML IN SOLN
3.0000 mL | Freq: Three times a day (TID) | RESPIRATORY_TRACT | Status: DC
  Administered 2018-05-27 – 2018-05-31 (×12): 3 mL via RESPIRATORY_TRACT
  Filled 2018-05-26 (×11): qty 3

## 2018-05-26 MED ORDER — SODIUM CHLORIDE 0.9% FLUSH
10.0000 mL | Freq: Two times a day (BID) | INTRAVENOUS | Status: DC
  Administered 2018-05-26: 10 mL
  Administered 2018-05-26: 20 mL
  Administered 2018-05-27: 10 mL
  Administered 2018-05-28 (×2): 20 mL
  Administered 2018-05-29 – 2018-06-01 (×6): 10 mL
  Administered 2018-06-01: 20 mL
  Administered 2018-06-02 – 2018-06-03 (×3): 10 mL
  Administered 2018-06-03: 20 mL
  Administered 2018-06-04: 10 mL
  Administered 2018-06-04 – 2018-06-05 (×2): 20 mL
  Administered 2018-06-05 – 2018-06-06 (×3): 10 mL
  Administered 2018-06-07: 20 mL
  Administered 2018-06-07: 10 mL
  Administered 2018-06-08: 20 mL
  Administered 2018-06-08 – 2018-06-10 (×3): 10 mL

## 2018-05-26 MED ORDER — SODIUM CHLORIDE 0.9% FLUSH
10.0000 mL | INTRAVENOUS | Status: DC | PRN
  Administered 2018-06-01: 10 mL
  Filled 2018-05-26: qty 40

## 2018-05-26 MED ORDER — IPRATROPIUM-ALBUTEROL 0.5-2.5 (3) MG/3ML IN SOLN: 3.0000 mL | RESPIRATORY_TRACT | Status: DC | PRN

## 2018-05-26 MED ORDER — CHLORHEXIDINE GLUCONATE CLOTH 2 % EX PADS
6.0000 | MEDICATED_PAD | Freq: Every day | CUTANEOUS | Status: DC
  Administered 2018-05-26 – 2018-05-29 (×4): 6 via TOPICAL

## 2018-05-26 NOTE — Care Management Note (Signed)
Case Management Note  Patient Details  Name: Simone CuriaVirginia L Garro MRN: [id number] Date of Birth: 04/26/1931  Subjective/Objective:   CHF, HCAP, COPD, empyema. From home alone. Independent. Has RW if needed. Wears continuous oxygen provided by [facility]. Has PCP- family drives her to appointments. Has lots of family support, several family members in room. Has had [facility] in the past. No current home health. Will have a pigtail drain placed tomorrow 05/27/2018 by IR.                Action/Plan: CM following for needs. Anticipate a PT referral closer to DC. ? [facility] with [facility].   Expected Discharge Date:   unknown              Expected Discharge Plan:  Home w Home Health Services  In-House Referral:     Discharge planning Services  CM Consult  Post Acute Care Choice:    Choice offered to:  Patient  DME Arranged:    DME Agency:     HH Arranged:    HH Agency:     Status of Service:  In process, will continue to follow  If discussed at Long Length of Stay Meetings, dates discussed:    Additional Comments:  Kimbra Marcelino, [name], RN 05/26/2018, 1:40 PM

## 2018-05-26 NOTE — Progress Notes (Signed)
PROGRESS NOTE    [name]  ZOX:[id number] DOB: [birth year] DOA: 05/21/2018 PCP: Benita Stabile, MD     Brief Narrative:  84 year old woman admitted from home on 8/2 with complaints of dyspnea.  She has a history of COPD, combined heart failure, hypertension, paroxysmal atrial fibrillation, sinus node dysfunction, diabetes and coronary artery disease.  She states her shortness of breath has been ongoing for about 1 month but has significantly worsened over the past 2 to 3 days prior to admission.  She visited the emergency department on July 13 and was discharged with prednisone and nebulizers for a presumed COPD exacerbation.  She then had a follow-up with her PCP who diagnosed her with pneumonia and started on antibiotics.  Because of no improvement she was referred to the emergency department for evaluation.  Chest x-ray showed a left pleural effusion, subsequently underwent thoracentesis on 8/2 which removed 300 cc of fluid which was obviously exudative with greater than 1000 WBCs and cultures growing coag negative staph and actinomyces.  Case was discussed with thoracic surgery who did not believe she was an adequate candidate for thoracotomy and has instead recommended IR consultation for placement of pigtail catheter which will be performed on 8/8.   Assessment & Plan:   Active Problems:   Essential hypertension, benign   Pleural effusion on left   Atrial fibrillation with RVR (HCC)   Goals of care, counseling/discussion   Chronic respiratory failure with hypoxia and hypercapnia (HCC)   Acute on chronic combined systolic and diastolic CHF (congestive heart failure) (HCC)   Chronic respiratory failure with hypoxia (HCC)   Pressure injury of skin   Acute respiratory failure with hypoxia and hypercarbia (HCC)   Acute metabolic encephalopathy   Lobar pneumonia (HCC)   Pulmonary actinomycosis (HCC)   Empyema, left (HCC)   Acute on chronic respiratory failure with hypoxia and  hypercarbia -Multifactorial in the setting of hospital-acquired pneumonia, exudative pleural effusion, congestive heart failure and COPD. -She uses BiPAP at nighttime, continue.  Hospital-acquired pneumonia/exudative pleural effusion -With sensitivities of pleural fluid culture, will narrow antibiotics to Rocephin only which should be sufficient coverage for both coag negative staph and actinomyces. -Vancomycin discontinued as of 8/7. -Plan for pigtail catheter placement on 8/8.  Will need to discuss with radiology follow-up plan for drainage and when it is safe to resume apixaban for atrial fibrillation.    Acute metabolic encephalopathy -Resolved and at baseline, daughter at baseline confirms. -Suspect due to hypercarbic respiratory failure as well as infectious process. -Continue BiPAP at bedtime.  Paroxysmal atrial fibrillation -Currently rate controlled, apixaban has been on hold for pigtail catheter placement.  Sinus node dysfunction -Status post permanent pacemaker  Coronary artery disease -Stable, no chest pain -Continue bisoprolol.  Type 2 diabetes -Well-controlled.  Acute on chronic kidney disease stage III -Baseline creatinine is around 1, creatinine peaked at 1.67. -Resolved and down to baseline of 0.82 on 8/7.   DVT prophylaxis: Heparin drip Code Status: DNR  Family Communication: daughter at bedside updated on plan of care and all questions answered Disposition Plan: pending improvement s/p pigtail catheter placement  Consultants:   None  Procedures:   As above  Antimicrobials:  Anti-infectives (From admission, onward)   Start     Dose/Rate Route Frequency Ordered Stop   05/25/18 1500  cefTRIAXone (ROCEPHIN) 2 g in sodium chloride 0.9 % 100 mL IVPB     2 g 200 mL/hr over 30 Minutes Intravenous Every 24 hours 05/25/18 1345  05/24/18 0900  vancomycin (VANCOCIN) IVPB 750 mg/150 ml premix  Status:  Discontinued     750 mg 150 mL/hr over 60 Minutes  Intravenous Every 24 hours 05/23/18 0741 05/26/18 1350   05/24/18 0800  ceFEPIme (MAXIPIME) 1 g in sodium chloride 0.9 % 100 mL IVPB  Status:  Discontinued     1 g 200 mL/hr over 30 Minutes Intravenous Every 24 hours 05/23/18 0740 05/25/18 1345   05/23/18 0830  vancomycin (VANCOCIN) 1,500 mg in sodium chloride 0.9 % 500 mL IVPB     1,500 mg 250 mL/hr over 120 Minutes Intravenous  Once 05/23/18 0737 05/23/18 1212   05/23/18 0830  ceFEPIme (MAXIPIME) 2 g in sodium chloride 0.9 % 100 mL IVPB     2 g 200 mL/hr over 30 Minutes Intravenous  Once 05/23/18 0739 05/23/18 1100       Subjective: Lying in bed, no complaints.  Objective: Vitals:   05/26/18 1424 05/26/18 1500 05/26/18 1600 05/26/18 1700  BP:  113/65 113/60 (!) 95/56  Pulse:  (!) 102 (!) 102 (!) 107  Resp:  (!) 26 (!) 23 (!) 23  Temp:    98.6 F (37 C)  TempSrc:    Oral  SpO2: 95% 96% 95% 94%  Weight:      Height:        Intake/Output Summary (Last 24 hours) at 05/26/2018 1849 Last data filed at 05/26/2018 1600 Gross per 24 hour  Intake 1081.9 ml  Output 1800 ml  Net -718.1 ml   Filed Weights   05/24/18 0600 05/25/18 0500 05/26/18 0500  Weight: 69.7 kg (153 lb 10.6 oz) 69.8 kg (153 lb 14.1 oz) 67.8 kg (149 lb 7.6 oz)    Examination:  General exam: Alert, awake, oriented x 3 Respiratory system: Normal respiratory effort, decreased breath sounds to bilateral bases Cardiovascular system:RRR. No murmurs, rubs, gallops. Gastrointestinal system: Abdomen is nondistended, soft and nontender. No organomegaly or masses felt. Normal bowel sounds heard. Central nervous system: Alert and oriented. No focal neurological deficits. Extremities: No C/C/E, +pedal pulses Skin: No rashes, lesions or ulcers Psychiatry: Judgement and insight appear normal. Mood & affect appropriate.     Data Reviewed: I have personally reviewed following labs and imaging studies  CBC: Recent Labs  Lab 05/21/18 1129 05/23/18 0600 05/24/18 0422  05/25/18 0423 05/26/18 0412  WBC 8.8 9.6 6.5 4.5 5.0  NEUTROABS 6.4 8.0*  --   --   --   HGB 10.2* 9.4* 8.0* 7.7* 7.9*  HCT 35.0* 34.5* 27.3* 25.5* 26.0*  MCV 97.8 102.4* 97.2 93.4 93.9  PLT 200 223 171 153 165   Basic Metabolic Panel: Recent Labs  Lab 05/22/18 0603 05/23/18 0600 05/24/18 0422 05/25/18 0423 05/26/18 0412  NA 142 140  141 138 140 138  K 4.8 5.6*  5.6* 3.1* 2.8* 3.7  CL 93* 90*  90* 89* 93* 95*  CO2 43* 42*  44* 39* 40* 38*  GLUCOSE 143* 158*  161* 121* 115* 113*  BUN 25* 39*  39* 48* 38* 31*  CREATININE 0.93 1.67*  1.59* 1.19* 0.85 0.82  CALCIUM 8.9 9.0  9.1 8.5* 8.6* 8.5*   GFR: Estimated Creatinine Clearance: 47.4 mL/min (by C-G formula based on SCr of 0.82 mg/dL). Liver Function Tests: Recent Labs  Lab 05/23/18 0600 05/26/18 0412  AST 15 14*  ALT 18 17  ALKPHOS 42 32*  BILITOT 0.7 0.8  PROT 6.5 5.2*  ALBUMIN 3.8 2.9*   No results for input(s): LIPASE, AMYLASE  in the last 168 hours. Recent Labs  Lab 05/23/18 0600  AMMONIA 22   Coagulation Profile: No results for input(s): INR, PROTIME in the last 168 hours. Cardiac Enzymes: Recent Labs  Lab 05/21/18 1129 05/23/18 0600  TROPONINI <0.03 <0.03   BNP (last 3 results) No results for input(s): PROBNP in the last 8760 hours. HbA1C: No results for input(s): HGBA1C in the last 72 hours. CBG: Recent Labs  Lab 05/21/18 1614  GLUCAP 129*   Lipid Profile: No results for input(s): CHOL, HDL, LDLCALC, TRIG, CHOLHDL, LDLDIRECT in the last 72 hours. Thyroid Function Tests: No results for input(s): TSH, T4TOTAL, FREET4, T3FREE, THYROIDAB in the last 72 hours. Anemia Panel: No results for input(s): VITAMINB12, FOLATE, FERRITIN, TIBC, IRON, RETICCTPCT in the last 72 hours. Urine analysis:    Component Value Date/Time   COLORURINE YELLOW 05/23/2018 1250   APPEARANCEUR HAZY (A) 05/23/2018 1250   LABSPEC 1.018 05/23/2018 1250   PHURINE 5.0 05/23/2018 1250   GLUCOSEU NEGATIVE 05/23/2018  1250   HGBUR NEGATIVE 05/23/2018 1250   BILIRUBINUR NEGATIVE 05/23/2018 1250   KETONESUR 5 (A) 05/23/2018 1250   PROTEINUR 30 (A) 05/23/2018 1250   NITRITE NEGATIVE 05/23/2018 1250   LEUKOCYTESUR NEGATIVE 05/23/2018 1250   Sepsis Labs: @LABRCNTIP (procalcitonin:4,lacticidven:4)  ) Recent Results (from the past 240 hour(s))  Culture, body fluid-bottle     Status: Abnormal (Preliminary result)   Collection Time: 05/21/18  3:05 PM  Result Value Ref Range Status   Specimen Description   Final    THORACENTESIS Performed at [facility], [address], [place], [place] [address]    Special Requests   Final    BOTTLES DRAWN AEROBIC AND ANAEROBIC 10CC Performed at [facility], [address] [place], [place] [address]    Gram Stain   Final    GRAM POSITIVE COCCI GRAM VARIABLE ROD Gram Stain Report Called to,Read Back By and Verified With: MURPHY @1121  ON [id number] BY HENDERSON L.    Culture (A)  Final    STAPHYLOCOCCUS CAPITIS ACTINOMYCES SPECIES Standardized susceptibility testing for this organism is not available. Performed at [facility], [address] [place], [place] [address]    Report Status PENDING  Incomplete   Organism ID, Bacteria STAPHYLOCOCCUS CAPITIS  Final      Susceptibility   Staphylococcus capitis - MIC*    CIPROFLOXACIN <=0.5 SENSITIVE Sensitive     ERYTHROMYCIN <=0.25 SENSITIVE Sensitive     GENTAMICIN <=0.5 SENSITIVE Sensitive     OXACILLIN <=0.25 SENSITIVE Sensitive     TETRACYCLINE <=1 SENSITIVE Sensitive     VANCOMYCIN 1 SENSITIVE Sensitive     TRIMETH/SULFA <=10 SENSITIVE Sensitive     CLINDAMYCIN <=0.25 SENSITIVE Sensitive     RIFAMPIN <=0.5 SENSITIVE Sensitive     Inducible Clindamycin NEGATIVE Sensitive     * STAPHYLOCOCCUS CAPITIS  Culture, blood (Routine X 2) w Reflex to ID Panel     Status: None (Preliminary result)   Collection Time: 05/23/18  6:00 AM  Result Value Ref Range Status   Specimen Description   Final    LEFT  ANTECUBITAL BOTTLES DRAWN AEROBIC AND ANAEROBIC   Special Requests Blood Culture adequate volume  Final   Culture   Final    NO GROWTH 3 DAYS Performed at [facility], [address] [place], [place] [address]    Report Status PENDING  Incomplete  Culture, blood (Routine X 2) w Reflex to ID Panel     Status: None (Preliminary result)  Collection Time: 05/23/18  8:45 AM  Result Value Ref Range Status   Specimen Description   Final    RIGHT ANTECUBITAL BOTTLES DRAWN AEROBIC AND ANAEROBIC   Special Requests Blood Culture adequate volume  Final   Culture   Final    NO GROWTH 3 DAYS Performed at [facility], [address] [place], [place] [address]    Report Status PENDING  Incomplete  Culture, Urine     Status: Abnormal   Collection Time: 05/23/18 12:50 PM  Result Value Ref Range Status   Specimen Description   Final    URINE, CLEAN CATCH Performed at [facility], [address], [place], [place] [address]    Special Requests   Final    NONE Performed at [facility], [address], [place], [place] [address]    Culture (A)  Final    <10,000 COLONIES/mL INSIGNIFICANT GROWTH Performed at [facility], [address], [place], [place] [address]    Report Status 05/25/2018 FINAL  Final  MRSA PCR Screening     Status: None   Collection Time: 05/23/18  1:00 PM  Result Value Ref Range Status   MRSA by PCR NEGATIVE NEGATIVE Final    Comment:        The GeneXpert MRSA Assay (FDA approved for NASAL specimens only), is one component of a comprehensive MRSA colonization surveillance program. It is not intended to diagnose MRSA infection nor to guide or monitor treatment for MRSA infections. Performed at [facility], [address] [place], [place] [address]          Radiology Studies: No results found.      Scheduled Meds: . bisoprolol  2.5 mg Oral Daily  . budesonide (PULMICORT) nebulizer solution  0.5 mg Nebulization BID  . chlorhexidine  15 mL Mouth  Rinse BID  . Chlorhexidine Gluconate Cloth  6 each Topical Daily  . furosemide  40 mg Oral Daily  . ipratropium-albuterol  3 mL Nebulization Q6H  . mouth rinse  15 mL Mouth Rinse q12n4p  . predniSONE  50 mg Oral Q breakfast  . sodium chloride flush  10-40 mL Intracatheter Q12H  . sodium chloride flush  3 mL Intravenous Q12H   Continuous Infusions: . sodium chloride Stopped (05/23/18 1100)  . cefTRIAXone (ROCEPHIN)  IV 2 g (05/26/18 1750)  . famotidine (PEPCID) IV Stopped (05/26/18 1155)  . heparin 850 Units/hr (05/26/18 0636)     LOS: 5 days    Time spent: 35 minutes. Greater than 50% of this time was spent in direct contact with the patient and with patient's daughter, coordinating care and discussing relevant ongoing clinical issues, including plan to change antibiotics, plan for catheter placement in a.m and potential follow-up and resumption of anticoagulation.     Chaya Jan, MD [facility] Pager [phone]  If 7PM-7AM, please contact night-coverage [email or web address] Password [facility] 05/26/2018, 6:49 PM

## 2018-05-26 NOTE — Progress Notes (Signed)
Patient ID: Kathy CuriaVirginia L Page, female   DOB: 12/23/1930, 84 y.o.   MRN: [id number]   Pt is scheduled for pigtail chest tube to be placed in [facility] on 8/8.  See orders Hep off at 800 am tomorrow  [facility]

## 2018-05-26 NOTE — Care Management Important Message (Signed)
Important Message  Patient Details  Name: [name] MRN: [id number] Date of Birth: 06/20/1931   Medicare Important Message Given:  Yes    Renie OraHawkins, Ruffin Lada Smith 05/26/2018, 1:32 PM

## 2018-05-26 NOTE — Progress Notes (Signed)
ANTICOAGULATION CONSULT NOTE - Preliminary  Pharmacy Consult for Heparin Indication: Atrial fibrillation  Allergies  Allergen Reactions  . Codeine Nausea Only  . Heparin Other (See Comments)    Causes internal bleeding     Patient Measurements: Height: 5\' 4"  (162.6 cm) Weight: 153 lb 14.1 oz (69.8 kg) IBW/kg (Calculated) : 54.7 HEPARIN DW (KG): 68.8   Vital Signs: Temp: 99.5 F (37.5 C) (08/07 0000) Temp Source: Axillary (08/07 0000) BP: 106/56 (08/07 0400) Pulse Rate: 99 (08/07 0400)  Labs: Recent Labs    05/23/18 0600 05/24/18 0422 05/24/18 1423 05/25/18 0423 05/26/18 0412  HGB 9.4* 8.0*  --  7.7* 7.9*  HCT 34.5* 27.3*  --  25.5* 26.0*  PLT 223 171  --  153 165  APTT  --   --   --  96* 131*  HEPARINUNFRC  --   --  >2.20* 1.16* >2.20*  CREATININE 1.67*  1.59* 1.19*  --  0.85 0.82  TROPONINI <0.03  --   --   --   --    Estimated Creatinine Clearance: 48.1 mL/min (by C-G formula based on SCr of 0.82 mg/dL).  Medical History: Past Medical History:  Diagnosis Date  . Asthma   . COPD (chronic obstructive pulmonary disease) (HCC)    Oxygen dependent  . Coronary atherosclerosis of native coronary artery   . Essential hypertension, benign   . History of GI bleed    Ulcer  . Hyperlipidemia   . Old inferior wall myocardial infarction 1999   [facility]  . PAF (paroxysmal atrial fibrillation) (HCC)   . Sinus node dysfunction (HCC)    Medtronic PPM  . Type 2 diabetes mellitus (HCC)     Medications:  Heparin IV infusion at 1000 units/hr <<8/07  Assessment: Pharmacy consulted to dose heparin in patient with afib. Heparin infusing at 1000 units/hr with labs results drawn this morning:  APTT supra therapeutic at 131 seconds Anti-Xa level 2.2 units/ml  Goal of Therapy:  Heparin level 0.3-0.7 units/ml APTT 66-102 seconds Monitor platelets by anticoagulation protocol: Yes   Plan:  Hold heparin infusion for 1 hour (held at 0530) Restart heparin at 850 units/hr at  0630 Check an-Xa level and aPTT level in 6 hours  Preliminary review of pertinent patient information completed.  Jeani HawkingAnnie Penn clinical pharmacist will complete review during morning rounds to assess the patient and finalize treatment regimen.  Arelia SneddonMason, Ryenne Lynam Anne, [facility] 05/26/2018,5:52 AM

## 2018-05-26 NOTE — Progress Notes (Signed)
ANTICOAGULATION CONSULT NOTE - follow up  Pharmacy Consult for Heparin Indication: Atrial fibrillation  Allergies  Allergen Reactions  . Codeine Nausea Only  . Heparin Other (See Comments)    Causes internal bleeding     Patient Measurements: Height: 5\' 4"  (162.6 cm) Weight: 149 lb 7.6 oz (67.8 kg) IBW/kg (Calculated) : 54.7 HEPARIN DW (KG): 68.8   Vital Signs: Temp: 98.4 F (36.9 C) (08/07 0700) Temp Source: Oral (08/07 0700) BP: 120/72 (08/07 1030) Pulse Rate: 106 (08/07 1030)  Labs: Recent Labs    05/24/18 0422  05/25/18 0423 05/26/18 0412 05/26/18 1220  HGB 8.0*  --  7.7* 7.9*  --   HCT 27.3*  --  25.5* 26.0*  --   PLT 171  --  153 165  --   APTT  --   --  96* 131* 68*  HEPARINUNFRC  --    < > 1.16* >2.20* 0.81*  CREATININE 1.19*  --  0.85 0.82  --    < > = values in this interval not displayed.   Estimated Creatinine Clearance: 47.4 mL/min (by C-G formula based on SCr of 0.82 mg/dL).  Medical History: Past Medical History:  Diagnosis Date  . Asthma   . COPD (chronic obstructive pulmonary disease) (HCC)    Oxygen dependent  . Coronary atherosclerosis of native coronary artery   . Essential hypertension, benign   . History of GI bleed    Ulcer  . Hyperlipidemia   . Old inferior wall myocardial infarction 1999   [facility]  . PAF (paroxysmal atrial fibrillation) (HCC)   . Sinus node dysfunction (HCC)    Medtronic PPM  . Type 2 diabetes mellitus (HCC)     Medications:  See med rec  Assessment: Pharmacy consulted to dose heparin in patient with afib. Patient was on apixaban at home with last dose being given 8/5 0917.  Patient has listed allergy to heparin with history of GI bleed ~20 years ago.  MD agrees to continue with heparin and pharmacy will monitor labs and s/s of bleeding.  Heparin level and APTT now therapeutic.   Goal of Therapy:  Heparin level 0.3-0.7 units/ml APTT 66-102 seconds Monitor platelets by anticoagulation protocol: Yes   Plan:   Continue heparin at 850 units/hr at 0630 Check an-Xa level daily Monitor for S/S of bleeding  Elder CyphersLorie Simi Briel, BS Loura BackPharm D, BCPS Clinical Pharmacist Pager [phone] 05/26/2018,1:08 PM

## 2018-05-27 ENCOUNTER — Ambulatory Visit (HOSPITAL_COMMUNITY)
Admit: 2018-05-27 | Discharge: 2018-05-27 | Disposition: A | Discharge status: Home / Self Care | Payer: Medicare Other | Source: Ambulatory Visit | Attending: Internal Medicine | Admitting: Internal Medicine

## 2018-05-27 ENCOUNTER — Ambulatory Visit (HOSPITAL_COMMUNITY): Admit: 2018-05-27 | Payer: Medicare Other

## 2018-05-27 ENCOUNTER — Encounter (HOSPITAL_COMMUNITY): Payer: Self-pay | Admitting: Interventional Radiology

## 2018-05-27 DIAGNOSIS — Z950 Presence of cardiac pacemaker: Secondary | ICD-10-CM | POA: Insufficient documentation

## 2018-05-27 DIAGNOSIS — I2510 Atherosclerotic heart disease of native coronary artery without angina pectoris: Secondary | ICD-10-CM

## 2018-05-27 DIAGNOSIS — Z885 Allergy status to narcotic agent status: Secondary | ICD-10-CM

## 2018-05-27 DIAGNOSIS — E785 Hyperlipidemia, unspecified: Secondary | ICD-10-CM | POA: Insufficient documentation

## 2018-05-27 DIAGNOSIS — J90 Pleural effusion, not elsewhere classified: Secondary | ICD-10-CM | POA: Diagnosis not present

## 2018-05-27 DIAGNOSIS — I509 Heart failure, unspecified: Secondary | ICD-10-CM | POA: Insufficient documentation

## 2018-05-27 DIAGNOSIS — Z7901 Long term (current) use of anticoagulants: Secondary | ICD-10-CM | POA: Insufficient documentation

## 2018-05-27 DIAGNOSIS — J449 Chronic obstructive pulmonary disease, unspecified: Secondary | ICD-10-CM | POA: Insufficient documentation

## 2018-05-27 DIAGNOSIS — Z87891 Personal history of nicotine dependence: Secondary | ICD-10-CM | POA: Insufficient documentation

## 2018-05-27 DIAGNOSIS — I480 Paroxysmal atrial fibrillation: Secondary | ICD-10-CM

## 2018-05-27 DIAGNOSIS — I110 Hypertensive heart disease with heart failure: Secondary | ICD-10-CM | POA: Insufficient documentation

## 2018-05-27 DIAGNOSIS — E119 Type 2 diabetes mellitus without complications: Secondary | ICD-10-CM | POA: Insufficient documentation

## 2018-05-27 DIAGNOSIS — J869 Pyothorax without fistula: Secondary | ICD-10-CM | POA: Insufficient documentation

## 2018-05-27 DIAGNOSIS — Z9981 Dependence on supplemental oxygen: Secondary | ICD-10-CM | POA: Insufficient documentation

## 2018-05-27 DIAGNOSIS — I252 Old myocardial infarction: Secondary | ICD-10-CM | POA: Insufficient documentation

## 2018-05-27 DIAGNOSIS — Z955 Presence of coronary angioplasty implant and graft: Secondary | ICD-10-CM

## 2018-05-27 HISTORY — PX: IR PERC PLEURAL DRAIN W/INDWELL CATH W/IMG GUIDE: IMG5383

## 2018-05-27 LAB — CULTURE, BODY FLUID-BOTTLE

## 2018-05-27 LAB — BLOOD GAS, ARTERIAL
DRAWN BY: 105551
FIO2: 28
O2 CONTENT: 2 L/min
O2 SAT: 88.5 %
pH, Arterial: 7.121 — CL (ref 7.350–7.450)
pO2, Arterial: 66.3 mmHg — ABNORMAL LOW (ref 83.0–108.0)

## 2018-05-27 LAB — PROTIME-INR
INR: 1.2
Prothrombin Time: 15.1 seconds (ref 11.4–15.2)

## 2018-05-27 LAB — CBC
HEMATOCRIT: 26.4 % — AB (ref 36.0–46.0)
HEMOGLOBIN: 8 g/dL — AB (ref 12.0–15.0)
MCH: 28.3 pg (ref 26.0–34.0)
MCHC: 30.3 g/dL (ref 30.0–36.0)
MCV: 93.3 fL (ref 78.0–100.0)
Platelets: 158 10*3/uL (ref 150–400)
RBC: 2.83 MIL/uL — ABNORMAL LOW (ref 3.87–5.11)
RDW: 14.5 % (ref 11.5–15.5)
WBC: 4.4 10*3/uL (ref 4.0–10.5)

## 2018-05-27 LAB — CULTURE, BODY FLUID W GRAM STAIN -BOTTLE

## 2018-05-27 LAB — GLUCOSE, CAPILLARY: Glucose-Capillary: 133 mg/dL — ABNORMAL HIGH (ref 70–99)

## 2018-05-27 LAB — HEPARIN LEVEL (UNFRACTIONATED): HEPARIN UNFRACTIONATED: 0.61 [IU]/mL (ref 0.30–0.70)

## 2018-05-27 MED ORDER — LIDOCAINE HCL (PF) 1 % IJ SOLN
INTRAMUSCULAR | Status: AC | PRN
  Administered 2018-05-27: 5 mL

## 2018-05-27 MED ORDER — MIDAZOLAM HCL 2 MG/2ML IJ SOLN
INTRAMUSCULAR | Status: AC
  Filled 2018-05-27: qty 2

## 2018-05-27 MED ORDER — MIDAZOLAM HCL 2 MG/2ML IJ SOLN
INTRAMUSCULAR | Status: AC | PRN
  Administered 2018-05-27: 1 mg via INTRAVENOUS

## 2018-05-27 MED ORDER — FENTANYL CITRATE (PF) 100 MCG/2ML IJ SOLN
INTRAMUSCULAR | Status: AC
  Filled 2018-05-27: qty 2

## 2018-05-27 MED ORDER — LIDOCAINE HCL 1 % IJ SOLN
INTRAMUSCULAR | Status: AC
  Filled 2018-05-27: qty 20

## 2018-05-27 MED ORDER — FENTANYL CITRATE (PF) 100 MCG/2ML IJ SOLN
INTRAMUSCULAR | Status: AC | PRN
  Administered 2018-05-27: 25 ug via INTRAVENOUS

## 2018-05-27 NOTE — Progress Notes (Signed)
ANTICOAGULATION CONSULT NOTE - follow up  Pharmacy Consult for Heparin Indication: Atrial fibrillation  Allergies  Allergen Reactions  . Codeine Nausea Only  . Heparin Other (See Comments)    Causes internal bleeding     Patient Measurements: Height: 5\' 4"  (162.6 cm) Weight: 149 lb 7.6 oz (67.8 kg) IBW/kg (Calculated) : 54.7 HEPARIN DW (KG): 68.8   Vital Signs: Temp: 98.8 F (37.1 C) (08/08 0800) Temp Source: Oral (08/08 0800) BP: 121/76 (08/08 0800) Pulse Rate: 106 (08/08 0800)  Labs: Recent Labs    05/25/18 0423 05/26/18 0412 05/26/18 1220 05/27/18 0422  HGB 7.7* 7.9*  --  8.0*  HCT 25.5* 26.0*  --  26.4*  PLT 153 165  --  158  APTT 96* 131* 68*  --   LABPROT  --   --   --  15.1  INR  --   --   --  1.20  HEPARINUNFRC 1.16* >2.20* 0.81* 0.61  CREATININE 0.85 0.82  --   --    Estimated Creatinine Clearance: 47.4 mL/min (by C-G formula based on SCr of 0.82 mg/dL).  Medical History: Past Medical History:  Diagnosis Date  . Asthma   . COPD (chronic obstructive pulmonary disease) (HCC)    Oxygen dependent  . Coronary atherosclerosis of native coronary artery   . Essential hypertension, benign   . History of GI bleed    Ulcer  . Hyperlipidemia   . Old inferior wall myocardial infarction 1999   [facility]  . PAF (paroxysmal atrial fibrillation) (HCC)   . Sinus node dysfunction (HCC)    [employer] PPM  . Type 2 diabetes mellitus (HCC)     Medications:  See med rec  Assessment: Pharmacy consulted to dose heparin in patient with afib. Patient was on apixaban at home with last dose being given 8/5 0917.  Patient has listed allergy to heparin with history of GI bleed ~20 years ago.  MD agrees to continue with heparin and pharmacy will monitor labs and s/s of bleeding.  Heparin level therapeutic  Goal of Therapy:  Heparin level 0.3-0.7 units/ml APTT 66-102 seconds Monitor platelets by anticoagulation protocol: Yes   Plan:  Continue heparin at 850 units/hr   Check heparin level daily Monitor labs and s/s of bleeding  Judeth CornfieldSteven Richardine Peppers, PharmD Clinical Pharmacist 05/27/2018 9:45 AM

## 2018-05-27 NOTE — Progress Notes (Signed)
PROGRESS NOTE    [name]  ZOX:[id number] DOB: 02/16/1931 DOA: 05/21/2018 PCP: Benita Stabile, MD     Brief Narrative:  84 year old woman admitted from home on 8/2 with complaints of dyspnea.  She has a history of COPD, combined heart failure, hypertension, paroxysmal atrial fibrillation, sinus node dysfunction, diabetes and coronary artery disease.  She states her shortness of breath has been ongoing for about 1 month but has significantly worsened over the past 2 to 3 days prior to admission.  She visited the emergency department on July 13 and was discharged with prednisone and nebulizers for a presumed COPD exacerbation.  She then had a follow-up with her PCP who diagnosed her with pneumonia and started on antibiotics.  Because of no improvement she was referred to the emergency department for evaluation.  Chest x-ray showed a left pleural effusion, subsequently underwent thoracentesis on 8/2 which removed 300 cc of fluid which was obviously exudative with greater than 1000 WBCs and cultures growing coag negative staph and actinomyces.  Case was discussed with thoracic surgery who did not believe she was an adequate candidate for thoracotomy and has instead recommended IR consultation for placement of pigtail catheter which will be performed on 8/8.   Assessment & Plan:   Active Problems:   Essential hypertension, benign   Pleural effusion on left   Atrial fibrillation with RVR (HCC)   Goals of care, counseling/discussion   Chronic respiratory failure with hypoxia and hypercapnia (HCC)   Acute on chronic combined systolic and diastolic CHF (congestive heart failure) (HCC)   Chronic respiratory failure with hypoxia (HCC)   Pressure injury of skin   Acute respiratory failure with hypoxia and hypercarbia (HCC)   Acute metabolic encephalopathy   Lobar pneumonia (HCC)   Pulmonary actinomycosis (HCC)   Empyema, left (HCC)   Acute on chronic respiratory failure with hypoxia and  hypercarbia -Multifactorial in the setting of hospital-acquired pneumonia, exudative pleural effusion, congestive heart failure and COPD. -She uses BiPAP at nighttime, continue.  Hospital-acquired pneumonia/exudative pleural effusion -With sensitivities of pleural fluid culture, will narrow antibiotics to Rocephin only which should be sufficient coverage for both coag negative staph and actinomyces. -Vancomycin discontinued as of 8/7. -Plan for pigtail catheter placement on 8/8.  Will need to discuss with radiology follow-up plan for drainage and when it is safe to resume apixaban for atrial fibrillation.    Acute metabolic encephalopathy -Resolved and at baseline, daughter at baseline confirms. -Suspect due to hypercarbic respiratory failure as well as infectious process. -Continue BiPAP at bedtime.  Paroxysmal atrial fibrillation -Currently rate controlled, apixaban has been on hold for pigtail catheter placement.  Sinus node dysfunction -Status post permanent pacemaker  Coronary artery disease -Stable, no chest pain -Continue bisoprolol.  Type 2 diabetes -Well-controlled.  Acute on chronic kidney disease stage III -Baseline creatinine is around 1, creatinine peaked at 1.67. -Resolved and down to baseline of 0.82 on 8/7.   DVT prophylaxis: Heparin drip Code Status: DNR  Family Communication: daughter at bedside updated on plan of care and all questions answered Disposition Plan: pending improvement s/p pigtail catheter placement  Consultants:   None  Procedures:   As above  Antimicrobials:  Anti-infectives (From admission, onward)   Start     Dose/Rate Route Frequency Ordered Stop   05/25/18 1500  cefTRIAXone (ROCEPHIN) 2 g in sodium chloride 0.9 % 100 mL IVPB     2 g 200 mL/hr over 30 Minutes Intravenous Every 24 hours 05/25/18 1345  05/24/18 0900  vancomycin (VANCOCIN) IVPB 750 mg/150 ml premix  Status:  Discontinued     750 mg 150 mL/hr over 60 Minutes  Intravenous Every 24 hours 05/23/18 0741 05/26/18 1350   05/24/18 0800  ceFEPIme (MAXIPIME) 1 g in sodium chloride 0.9 % 100 mL IVPB  Status:  Discontinued     1 g 200 mL/hr over 30 Minutes Intravenous Every 24 hours 05/23/18 0740 05/25/18 1345   05/23/18 0830  vancomycin (VANCOCIN) 1,500 mg in sodium chloride 0.9 % 500 mL IVPB     1,500 mg 250 mL/hr over 120 Minutes Intravenous  Once 05/23/18 0737 05/23/18 1212   05/23/18 0830  ceFEPIme (MAXIPIME) 2 g in sodium chloride 0.9 % 100 mL IVPB     2 g 200 mL/hr over 30 Minutes Intravenous  Once 05/23/18 0739 05/23/18 1100       Subjective: In bed, no complaints. States she had a "great night" on Bipap last night after mask adjustment.  Objective: Vitals:   05/27/18 1035 05/27/18 1600 05/27/18 1700 05/27/18 1800  BP: 121/70 116/65 (!) 114/46 (!) 102/53  Pulse: (!) 101 99 (!) 120 (!) 102  Resp:  (!) 22 (!) 26 (!) 23  Temp:  97.8 F (36.6 C)    TempSrc:  Oral    SpO2:  96% (!) 83% 97%  Weight:      Height:        Intake/Output Summary (Last 24 hours) at 05/27/2018 1842 Last data filed at 05/27/2018 1600 Gross per 24 hour  Intake 332 ml  Output 450 ml  Net -118 ml   Filed Weights   05/25/18 0500 05/26/18 0500 05/27/18 0500  Weight: 69.8 kg 67.8 kg 67.8 kg    Examination:  General exam: Alert, awake, oriented x 3 Respiratory system: Decreased breath sounds to bilateral bases. Respiratory effort normal. Cardiovascular system:RRR. No murmurs, rubs, gallops. Gastrointestinal system: Abdomen is nondistended, soft and nontender. No organomegaly or masses felt. Normal bowel sounds heard. Central nervous system: Alert and oriented. No focal neurological deficits. Extremities: No C/C/E, +pedal pulses Skin: No rashes, lesions or ulcers Psychiatry: Judgement and insight appear normal. Mood & affect appropriate.      Data Reviewed: I have personally reviewed following labs and imaging studies  CBC: Recent Labs  Lab 05/21/18 1129  05/23/18 0600 05/24/18 0422 05/25/18 0423 05/26/18 0412 05/27/18 0422  WBC 8.8 9.6 6.5 4.5 5.0 4.4  NEUTROABS 6.4 8.0*  --   --   --   --   HGB 10.2* 9.4* 8.0* 7.7* 7.9* 8.0*  HCT 35.0* 34.5* 27.3* 25.5* 26.0* 26.4*  MCV 97.8 102.4* 97.2 93.4 93.9 93.3  PLT 200 223 171 153 165 158   Basic Metabolic Panel: Recent Labs  Lab 05/22/18 0603 05/23/18 0600 05/24/18 0422 05/25/18 0423 05/26/18 0412  NA 142 140  141 138 140 138  K 4.8 5.6*  5.6* 3.1* 2.8* 3.7  CL 93* 90*  90* 89* 93* 95*  CO2 43* 42*  44* 39* 40* 38*  GLUCOSE 143* 158*  161* 121* 115* 113*  BUN 25* 39*  39* 48* 38* 31*  CREATININE 0.93 1.67*  1.59* 1.19* 0.85 0.82  CALCIUM 8.9 9.0  9.1 8.5* 8.6* 8.5*   GFR: Estimated Creatinine Clearance: 47.4 mL/min (by C-G formula based on SCr of 0.82 mg/dL). Liver Function Tests: Recent Labs  Lab 05/23/18 0600 05/26/18 0412  AST 15 14*  ALT 18 17  ALKPHOS 42 32*  BILITOT 0.7 0.8  PROT 6.5  5.2*  ALBUMIN 3.8 2.9*   No results for input(s): LIPASE, AMYLASE in the last 168 hours. Recent Labs  Lab 05/23/18 0600  AMMONIA 22   Coagulation Profile: Recent Labs  Lab 05/27/18 0422  INR 1.20   Cardiac Enzymes: Recent Labs  Lab 05/21/18 1129 05/23/18 0600  TROPONINI <0.03 <0.03   BNP (last 3 results) No results for input(s): PROBNP in the last 8760 hours. HbA1C: No results for input(s): HGBA1C in the last 72 hours. CBG: Recent Labs  Lab 05/21/18 1614 05/27/18 1101  GLUCAP 129* 133*   Lipid Profile: No results for input(s): CHOL, HDL, LDLCALC, TRIG, CHOLHDL, LDLDIRECT in the last 72 hours. Thyroid Function Tests: No results for input(s): TSH, T4TOTAL, FREET4, T3FREE, THYROIDAB in the last 72 hours. Anemia Panel: No results for input(s): VITAMINB12, FOLATE, FERRITIN, TIBC, IRON, RETICCTPCT in the last 72 hours. Urine analysis:    Component Value Date/Time   COLORURINE YELLOW 05/23/2018 1250   APPEARANCEUR HAZY (A) 05/23/2018 1250   LABSPEC  1.018 05/23/2018 1250   PHURINE 5.0 05/23/2018 1250   GLUCOSEU NEGATIVE 05/23/2018 1250   HGBUR NEGATIVE 05/23/2018 1250   BILIRUBINUR NEGATIVE 05/23/2018 1250   KETONESUR 5 (A) 05/23/2018 1250   PROTEINUR 30 (A) 05/23/2018 1250   NITRITE NEGATIVE 05/23/2018 1250   LEUKOCYTESUR NEGATIVE 05/23/2018 1250   Sepsis Labs: @LABRCNTIP (procalcitonin:4,lacticidven:4)  ) Recent Results (from the past 240 hour(s))  Culture, body fluid-bottle     Status: Abnormal   Collection Time: 05/21/18  3:05 PM  Result Value Ref Range Status   Specimen Description   Final    THORACENTESIS Performed at [facility], [address] [place], [place] [address]    Special Requests   Final    BOTTLES DRAWN AEROBIC AND ANAEROBIC 10CC Performed at [facility], [address] [place], [place] [address]    Gram Stain   Final    GRAM POSITIVE COCCI GRAM VARIABLE ROD Gram Stain Report Called to,Read Back By and Verified With: MURPHY @1121  ON [id number] BY HENDERSON L.    Culture (A)  Final    STAPHYLOCOCCUS CAPITIS ACTINOMYCES SPECIES Standardized susceptibility testing for this organism is not available. Performed at [facility], [address] [place], [place] [address]    Report Status 05/27/2018 FINAL  Final   Organism ID, Bacteria STAPHYLOCOCCUS CAPITIS  Final      Susceptibility   Staphylococcus capitis - MIC*    CIPROFLOXACIN <=0.5 SENSITIVE Sensitive     ERYTHROMYCIN <=0.25 SENSITIVE Sensitive     GENTAMICIN <=0.5 SENSITIVE Sensitive     OXACILLIN <=0.25 SENSITIVE Sensitive     TETRACYCLINE <=1 SENSITIVE Sensitive     VANCOMYCIN 1 SENSITIVE Sensitive     TRIMETH/SULFA <=10 SENSITIVE Sensitive     CLINDAMYCIN <=0.25 SENSITIVE Sensitive     RIFAMPIN <=0.5 SENSITIVE Sensitive     Inducible Clindamycin NEGATIVE Sensitive     * STAPHYLOCOCCUS CAPITIS  Culture, blood (Routine X 2) w Reflex to ID Panel     Status: None (Preliminary result)   Collection Time: 05/23/18  6:00 AM  Result Value  Ref Range Status   Specimen Description   Final    LEFT ANTECUBITAL BOTTLES DRAWN AEROBIC AND ANAEROBIC   Special Requests Blood Culture adequate volume  Final   Culture   Final    NO GROWTH 4 DAYS Performed at [facility], [address] [place], [place] [address]    Report Status PENDING  Incomplete  Culture, blood (Routine X 2)  w Reflex to ID Panel     Status: None (Preliminary result)   Collection Time: 05/23/18  8:45 AM  Result Value Ref Range Status   Specimen Description   Final    RIGHT ANTECUBITAL BOTTLES DRAWN AEROBIC AND ANAEROBIC   Special Requests Blood Culture adequate volume  Final   Culture   Final    NO GROWTH 4 DAYS Performed at [facility], [address], [place], [place] [address]    Report Status PENDING  Incomplete  Culture, Urine     Status: Abnormal   Collection Time: 05/23/18 12:50 PM  Result Value Ref Range Status   Specimen Description   Final    URINE, CLEAN CATCH Performed at [facility], [address], [place], [place] [address]    Special Requests   Final    NONE Performed at [facility], [address] [place], [place] [address]    Culture (A)  Final    <10,000 COLONIES/mL INSIGNIFICANT GROWTH Performed at [facility], [address] [place], [place] [address]    Report Status 05/25/2018 FINAL  Final  MRSA PCR Screening     Status: None   Collection Time: 05/23/18  1:00 PM  Result Value Ref Range Status   MRSA by PCR NEGATIVE NEGATIVE Final    Comment:        The GeneXpert MRSA Assay (FDA approved for NASAL specimens only), is one component of a comprehensive MRSA colonization surveillance program. It is not intended to diagnose MRSA infection nor to guide or monitor treatment for MRSA infections. Performed at [facility], [address], [place], [place] [address]          Radiology Studies: Ir Perc Pleural Horace Porteous Eliezer Bottom Cath W/img Guide  Result Date: 05/27/2018 CLINICAL DATA:  Left empyema with residual  effusion. Drainage requested. EXAM: INSERTION OF  PLEURAL DRAINAGE CATHETER ANESTHESIA/SEDATION: Intravenous Fentanyl and Versed were administered as conscious sedation during continuous monitoring of the patient's level of consciousness and physiological / cardiorespiratory status by the radiology RN, with a total moderate sedation time of 6 minutes. MEDICATIONS: No periprocedural antibiotics indicated FLUOROSCOPY TIME:  6 seconds; 1 mGy PROCEDURE: The procedure, risks, benefits, and alternatives were explained to the patient. Questions regarding the procedure were encouraged and answered. The patient understands and consents to the procedure. Under ultrasound, an appropriate skin entry site to approach the left pleural effusion was localized and marked. The left chest wall was prepped with Betadine in a sterile fashion, and a sterile drape was applied covering the operative field. A sterile gown and sterile gloves were used for the procedure. Local anesthesia was provided with 1% Lidocaine. Ultrasound image documentation was performed. Fluoroscopy during the procedure and fluoroscopic spot radiograph confirms appropriate catheter position. After creating a small skin incision, a 19 gauge needle was advanced into the pleural cavity under ultrasound guidance. A guide wire was then advanced under fluoroscopy into the pleural space. Guidewire position confirmed under fluoroscopy. Pleural access was dilated serially and a 12 French pigtail catheter was placed. Catheter was connected to externally [place]ara Pleur-evac device. Catheter was secured externally with 0 Prolene suture and covered with Vaseline gauze and sterile gauze dressing. Fluoroscopic spot image confirms good catheter position. The patient tolerated the procedure well. COMPLICATIONS: None. FINDINGS: Residual pleural effusion at the left lung base was confirmed with ultrasound. 12 French pigtail drain catheter placed as above. IMPRESSION: 1. Technically  successful left pleural drain placement. Electronically Signed   By: Algis Downs  Deanne Coffer M.D.   On: 05/27/2018 16:18        Scheduled Meds: . bisoprolol  2.5 mg Oral Daily  . budesonide (PULMICORT) nebulizer solution  0.5 mg Nebulization BID  . chlorhexidine  15 mL Mouth Rinse BID  . Chlorhexidine Gluconate Cloth  6 each Topical Daily  . furosemide  40 mg Oral Daily  . ipratropium-albuterol  3 mL Nebulization TID  . mouth rinse  15 mL Mouth Rinse q12n4p  . predniSONE  50 mg Oral Q breakfast  . sodium chloride flush  10-40 mL Intracatheter Q12H  . sodium chloride flush  3 mL Intravenous Q12H   Continuous Infusions: . sodium chloride Stopped (05/23/18 1100)  . cefTRIAXone (ROCEPHIN)  IV Stopped (05/27/18 1727)  . famotidine (PEPCID) IV Stopped (05/27/18 0940)  . heparin 850 Units/hr (05/27/18 1614)     LOS: 6 days    Time spent: 25 minutes.     Chaya Jan, MD [facility] Pager [phone]  If 7PM-7AM, please contact night-coverage [email or web address] Password [facility] 05/27/2018, 6:42 PM

## 2018-05-27 NOTE — Consult Note (Addendum)
Chief Complaint: Patient was seen in consultation today for  Left pigtail chest tube placement at the request of Tat,David  Referring Physician(s): Tat,David  Supervising Physician: Oley Balm  Patient Status: [facility] IP  History of Present Illness: [name] is a 84 y.o. female   COPD; CHF; HTN; PAF (on Eliquis LD 8/5) Dyspnea after Tx for PNA late July 2019  300 cc thoracentesis 8/2:  Exudative Cx Coag neg staph and actinomyces Worsening left pleural effusion 8/5 CT  Request for left pigtail chest tube placement  Dr Deanne Coffer reviewed imaging and approved procedure LD Eliquis 8/5 Now for chest tube placement     Past Medical History:  Diagnosis Date  . Asthma   . COPD (chronic obstructive pulmonary disease) (HCC)    Oxygen dependent  . Coronary atherosclerosis of native coronary artery   . Essential hypertension, benign   . History of GI bleed    Ulcer  . Hyperlipidemia   . Old inferior wall myocardial infarction 1999   [facility]  . PAF (paroxysmal atrial fibrillation) (HCC)   . Sinus node dysfunction (HCC)    [employer] PPM  . Type 2 diabetes mellitus (HCC)     Past Surgical History:  Procedure Laterality Date  . Cesareen section    . CHOLECYSTECTOMY    . COLONOSCOPY  09/2004   RMR: For obscure GI bleed. Left-sided diverticula, otherwise normal ileocolonoscopy  . Coronary artery stent  1999   BMS x 3 RCA  ([facility]), PTCRA RC4  . EP IMPLANTABLE DEVICE N/A 06/26/2016   Procedure: Pacemaker Implant;  Surgeon: Will Jorja Loa, MD;  Location: [facility];  Service: Cardiovascular;  Laterality: N/A;  . ESOPHAGOGASTRODUODENOSCOPY  11 2005   RMR: For melena, normal exam.  . EXPLORATORY LAPAROTOMY    . GIVENS CAPSULE STUDY  08/2004   official report unavailable, but reported couple of AVMs, nonbleeding  . PARTIAL COLECTOMY  1977   blockage/gangrene? unclear if small bowel or colon  . THORACENTESIS Left 03/10/16   1 L transudative fluid     Allergies: Codeine and Heparin  Medications: Prior to Admission medications   Medication Sig Start Date End Date Taking? Authorizing Provider  apixaban (ELIQUIS) 5 MG TABS tablet Take 1 tablet (5 mg total) by mouth 2 (two) times daily. 03/20/16   Erick Blinks, MD  bisoprolol (ZEBETA) 5 MG tablet Take 1 tablet (5 mg total) by mouth daily. Pt needs appointment for further refills. Patient taking differently: Take 2.5 mg by mouth daily. Take one tablet by mouth in the morning and one at night. 07/16/16   Nyoka Cowden, MD  escitalopram (LEXAPRO) 20 MG tablet Take 20 mg by mouth at bedtime.     [provider]  furosemide (LASIX) 40 MG tablet Take 40 mg by mouth daily. Daily at 0800 am    [provider]  ipratropium-albuterol (DUONEB) 0.5-2.5 (3) MG/3ML SOLN Take 3 mLs by nebulization 3 (three) times daily. Patient taking differently: Take 3 mLs by nebulization every 6 (six) hours as needed (for shortness of breath).  03/20/16   Erick Blinks, MD  LORazepam (ATIVAN) 0.5 MG tablet Take 1 tablet (0.5 mg total) by mouth daily. Take one tablet by mouth once daily Patient taking differently: Take 0.5 mg by mouth at bedtime. Take one tablet by mouth once daily 04/15/16   Sharon Seller, NP  Melatonin 10 MG TABS Take 1 tablet by mouth at bedtime.    [provider]  Multiple Vitamins-Minerals (  CENTRUM SILVER 50+WOMEN PO) Take 1 tablet by mouth daily.    [provider]  nitroGLYCERIN (NITROSTAT) 0.4 MG SL tablet Place 1 tablet (0.4 mg total) under the tongue every 5 (five) minutes as needed for chest pain. 04/24/14   Jonelle Sidle, MD  omeprazole (PRILOSEC) 20 MG capsule Take 1 capsule (20 mg total) by mouth 2 (two) times daily before a meal. 06/12/16   Nyoka Cowden, MD  OXYGEN Inhale 2.5 L into the lungs continuous.    [provider]  potassium chloride (K-DUR,KLOR-CON) 10 MEQ tablet Take 10 mEq by mouth 2 (two) times daily.    [provider]  vitamin B-12 (CYANOCOBALAMIN) 1000 MCG tablet Take 1,000 mcg by mouth daily.    [provider]     Family History  Problem Relation Age of Onset  . Diabetes Mother   . Diabetes Father   . Heart disease Father   . Cancer Sister        unknown  . Cancer Brother        unknown  . Diabetes Brother   . Colon cancer Neg Hx     Social History   Socioeconomic History  . Marital status: Widowed    Spouse name: Not on file  . Number of children: 2  . Years of education: Not on file  . Highest education level: Not on file  Occupational History  . Not on file  Social Needs  . Financial resource strain: Not on file  . Food insecurity:    Worry: Not on file    Inability: Not on file  . Transportation needs:    Medical: Not on file    Non-medical: Not on file  Tobacco Use  . Smoking status: Former Smoker    Packs/day: 2.00    Years: 40.00    Pack years: 80.00    Types: Cigarettes    Last attempt to quit: 01/13/1998    Years since quitting: 20.3  . Smokeless tobacco: Never Used  Substance and Sexual Activity  . Alcohol use: No    Alcohol/week: 0.0 standard drinks  . Drug use: No  . Sexual activity: Not on file  Lifestyle  . Physical activity:    Days per week: Not on file    Minutes per session: Not on file  . Stress: Not on file  Relationships  . Social connections:    Talks on phone: Not on file    Gets together: Not on file    Attends religious service: Not on file    Active member of club or organization: Not on file    Attends meetings of clubs or organizations: Not on file    Relationship status: Not on file  Other Topics Concern  . Not on file  Social History Narrative  . Not on file    Review of Systems: A 12 point ROS discussed and pertinent positives are indicated in the HPI above.  All other systems are negative.  Review of Systems  Constitutional: Positive for activity change, appetite change and fatigue. Negative for fever.   Respiratory: Positive for cough and shortness of breath.   Cardiovascular: Negative for chest pain.  Gastrointestinal: Negative for abdominal pain.  Neurological: Positive for weakness.  Psychiatric/Behavioral: Negative for behavioral problems and confusion.    Vital Signs: There were no vitals taken for this visit.  Physical Exam  Constitutional: She is oriented to person, place, and time.  Cardiovascular: Normal rate and regular rhythm.  Pulmonary/Chest: Effort normal. She has wheezes.  Abdominal: Soft. Bowel sounds are normal.  Musculoskeletal: Normal range of motion.  Neurological: She is alert and oriented to person, place, and time.  Skin: Skin is warm and dry.  Psychiatric: She has a normal mood and affect. Her behavior is normal. Judgment and thought content normal.  Vitals reviewed.   Imaging: Dg Chest 1 View  Result Date: 05/21/2018 CLINICAL DATA:  LEFT pleural effusion post thoracentesis EXAM: CHEST  1 VIEW COMPARISON:  Earlier exam of 05/21/2018 FINDINGS: Stable LEFT subclavian pacemaker and cardiac enlargement. Atherosclerotic calcification aorta. Persistent LEFT basilar opacity by atelectasis and effusion. No pneumothorax following thoracentesis. Remaining lungs clear. Bones demineralized. IMPRESSION: No pneumothorax following LEFT thoracentesis. Electronically Signed   By: Ulyses Southward M.D.   On: 05/21/2018 15:59   Dg Chest 1 View  Result Date: 05/21/2018 CLINICAL DATA:  Lateral radiograph requested.  Query pneumonia. EXAM: CHEST  1 VIEW COMPARISON:  AP radiograph done earlier. : The heart remains enlarged. Retrocardiac density likely effusion and atelectasis, no definite consolidation, is increased from 05/10/2018. IMPRESSION: Worsening aeration. Electronically Signed   By: Elsie Stain M.D.   On: 05/21/2018 12:48   Dg Chest 2 View  Result Date: 05/10/2018 CLINICAL DATA:  One-week history of progressive shortness of breath. Former smoker with 80 pack-year history.  Current history of COPD, hypertension, diabetes and COPD. EXAM: CHEST - 2 VIEW COMPARISON:  05/01/2018, 06/27/2016 and earlier. FINDINGS: Cardiac silhouette moderately enlarged, unchanged dating back to 2011. LEFT subclavian dual lead transvenous pacemaker unchanged and intact. Thoracic aorta atherosclerotic, unchanged. Hilar and mediastinal contours otherwise unremarkable. Pulmonary venous hypertension without overt edema. Hyperinflation, mildly prominent bronchovascular markings and mild central peribronchial thickening, unchanged. Small LEFT pleural effusion, unchanged since the most recent prior examination 9 days ago. No new pulmonary parenchymal abnormalities. Degenerative changes and DISH involving the thoracic spine. Severe degenerative changes involving the LEFT shoulder joint with associated calcified loose bodies. IMPRESSION: 1. Small LEFT pleural effusion, unchanged since the chest x-ray 9 days ago. No acute cardiopulmonary disease otherwise. 2. Stable cardiomegaly without evidence of pulmonary edema. 3.  Emphysema (ICD10-J43.9). Electronically Signed   By: Hulan Saas M.D.   On: 05/10/2018 13:01   Ct Head Wo Contrast  Result Date: 05/22/2018 CLINICAL DATA:  84 y/o F; Altered level of consciousness (LOC), unexplained. EXAM: CT HEAD WITHOUT CONTRAST TECHNIQUE: Contiguous axial images were obtained from the base of the skull through the vertex without intravenous contrast. COMPARISON:  06/21/2016 CT head FINDINGS: Brain: No evidence of acute infarction, hemorrhage, hydrocephalus, extra-axial collection or mass lesion/mass effect. Mild chronic microvascular ischemic changes and volume loss of the brain for age. Vascular: Calcific atherosclerosis of carotid siphons. No hyperdense vessel identified. Skull: Normal. Negative for fracture or focal lesion. Sinuses/Orbits: No acute finding. Other: None. IMPRESSION: 1. No acute intracranial abnormality identified. 2. Mild chronic microvascular ischemic  changes and volume loss of the brain for age. Electronically Signed   By: Mitzi Hansen M.D.   On: 05/22/2018 23:45   Ct Chest Wo Contrast  Result Date: 05/24/2018 CLINICAL DATA:  Follow-up pleural effusion. EXAM: CT CHEST WITHOUT CONTRAST TECHNIQUE: Multidetector CT imaging of the chest was performed following the standard protocol without IV contrast. COMPARISON:  Chest x-ray dated 05/23/2018. Chest CT dated 08/31/2003. FINDINGS: Cardiovascular: Cardiomegaly. No pericardial effusion. Extensive aortic atherosclerosis. No thoracic aortic aneurysm. Three-vessel coronary artery calcifications. Mediastinum/Nodes: No mass or enlarged lymph nodes seen within the mediastinum or perihilar regions. Esophagus is unremarkable. Trachea and  central bronchi are unremarkable. Lungs/Pleura: Bilateral pleural effusions, small to moderate in size, RIGHT greater than LEFT. Chronic scarring/atelectasis at the LEFT lung base. Chronic pleural based nodule along the lateral margin of the RIGHT lung, near the junction of the major and minor fissures. No evidence of pneumonia. No pneumothorax. Upper Abdomen: No acute findings. Musculoskeletal: Degenerative changes throughout the slightly scoliotic thoracolumbar spine, mild to moderate in degree. No acute or suspicious osseous finding. Presumed chronic sebaceous cyst within the superficial soft tissues of the lower RIGHT back. Superficial soft tissues of the chest are otherwise unremarkable. IMPRESSION: 1. Bilateral pleural effusions, small to moderate in size, RIGHT greater than LEFT, with associated atelectasis. 2. No other acute appearing findings. 3. Cardiomegaly. 4. Diffuse coronary artery calcifications. Aortic Atherosclerosis (ICD10-I70.0). Electronically Signed   By: Bary Richard M.D.   On: 05/24/2018 13:43   US Venous Img Lower Bilateral  Result Date: 05/22/2018 CLINICAL DATA:  84 year old female with bilateral lower extremity pain and shortness of breath EXAM:  BILATERAL LOWER EXTREMITY VENOUS DOPPLER ULTRASOUND TECHNIQUE: Gray-scale sonography with graded compression, as well as color Doppler and duplex ultrasound were performed to evaluate the lower extremity deep venous systems from the level of the common femoral vein and including the common femoral, femoral, profunda femoral, popliteal and calf veins including the posterior tibial, peroneal and gastrocnemius veins when visible. The superficial great saphenous vein was also interrogated. Spectral Doppler was utilized to evaluate flow at rest and with distal augmentation maneuvers in the common femoral, femoral and popliteal veins. COMPARISON:  None. FINDINGS: RIGHT LOWER EXTREMITY Common Femoral Vein: No evidence of thrombus. Normal compressibility, respiratory phasicity and response to augmentation. Saphenofemoral Junction: No evidence of thrombus. Normal compressibility and flow on color Doppler imaging. Profunda Femoral Vein: No evidence of thrombus. Normal compressibility and flow on color Doppler imaging. Femoral Vein: No evidence of thrombus. Normal compressibility, respiratory phasicity and response to augmentation. Popliteal Vein: No evidence of thrombus. Normal compressibility, respiratory phasicity and response to augmentation. Calf Veins: No evidence of thrombus. Normal compressibility and flow on color Doppler imaging. Superficial Great Saphenous Vein: No evidence of thrombus. Normal compressibility. Venous Reflux:  None. Other Findings:  None. LEFT LOWER EXTREMITY Common Femoral Vein: No evidence of thrombus. Normal compressibility, respiratory phasicity and response to augmentation. Saphenofemoral Junction: No evidence of thrombus. Normal compressibility and flow on color Doppler imaging. Profunda Femoral Vein: No evidence of thrombus. Normal compressibility and flow on color Doppler imaging. Femoral Vein: No evidence of thrombus. Normal compressibility, respiratory phasicity and response to  augmentation. Popliteal Vein: No evidence of thrombus. Normal compressibility, respiratory phasicity and response to augmentation. Calf Veins: No evidence of thrombus. Normal compressibility and flow on color Doppler imaging. Superficial Great Saphenous Vein: No evidence of thrombus. Normal compressibility. Venous Reflux:  None. Other Findings:  None. IMPRESSION: 1. No evidence of deep venous thrombosis in either lower extremity. 2. Increased pulsatility of the venous waveforms bilaterally. Similar findings can be seen in the setting of elevated right heart pressures. Common differential considerations include tricuspid regurgitation, right-sided heart failure, pulmonary arterial hypertension and long-standing COPD. Electronically Signed   By: Malachy Moan M.D.   On: 05/22/2018 12:45   Dg Chest Port 1 View  Result Date: 05/23/2018 CLINICAL DATA:  Status post PICC line placement EXAM: PORTABLE CHEST 1 VIEW COMPARISON:  05/23/2018 FINDINGS: Cardiac shadow is stable. Pacing device is again seen. New right-sided PICC line is noted at the cavoatrial junction. Bilateral pleural effusions are noted. IMPRESSION: PICC line in  satisfactory position. The remainder of the exam is stable. Electronically Signed   By: Alcide Clever M.D.   On: 05/23/2018 12:04   Dg Chest Port 1 View  Result Date: 05/23/2018 CLINICAL DATA:  Hypoxia. EXAM: PORTABLE CHEST 1 VIEW COMPARISON:  Radiographs 2 days ago 05/21/2018 FINDINGS: Left-sided pacemaker remains in place. Increasing [place] bibasilar opacity likely combination of pleural fluid and atelectasis. The heart is enlarged. Unchanged mediastinal contours from prior exam. Increasing vascular congestion. No pneumothorax. IMPRESSION: Increasing hazy opacity at the lung bases, left greater than right, likely pleural fluid and adjacent atelectasis. Increasing vascular congestion. Electronically Signed   By: Rubye Oaks M.D.   On: 05/23/2018 06:52   Dg Chest Portable 1 View  Result  Date: 05/21/2018 CLINICAL DATA:  Short of breath and weakness. EXAM: PORTABLE CHEST 1 VIEW COMPARISON:  05/10/2018 FINDINGS: Cardiac enlargement with dual lead pacemaker. Negative for heart failure. Mild left lower lobe airspace disease and probable small left effusion unchanged from the prior study. Right lung remains clear. IMPRESSION: Mild left lower lobe airspace disease and probable left effusion unchanged. This area is difficult to evaluate on portable image given the large cardiac silhouette. Electronically Signed   By: Marlan Palau M.D.   On: 05/21/2018 11:45   Dg Chest Port 1 View  Result Date: 05/01/2018 CLINICAL DATA:  Shortness of breath and chest pain EXAM: PORTABLE CHEST 1 VIEW COMPARISON:  Chest radiograph 06/27/2016 FINDINGS: There is a small left pleural effusion with associated atelectasis. Cardiomegaly is unchanged. Left chest wall pacemaker with leads in expected position. No pulmonary edema or focal consolidation. IMPRESSION: Small no pulmonary edema. Left pleural effusion with associated basilar atelectasis. Electronically Signed   By: Deatra Robinson M.D.   On: 05/01/2018 06:42   US Thoracentesis Asp Pleural Space W/img Guide  Result Date: 05/21/2018 INDICATION: LEFT pleural effusion EXAM: ULTRASOUND GUIDED DIAGNOSTIC LEFT THORACENTESIS MEDICATIONS: None. COMPLICATIONS: None immediate. PROCEDURE: Procedure, benefits, and risks of procedure were discussed with patient. Written informed consent for procedure was obtained. Time out protocol followed. Pleural effusion localized by ultrasound at the posterior LEFT hemithorax. Skin prepped and draped in usual sterile fashion. Skin and soft tissues anesthetized with 10 ML of 1% lidocaine. 8 French thoracentesis catheter placed into the LEFT pleural space. 300 mL of clear serous fluid aspirated by syringe pump. Procedure tolerated well by patient without immediate complication. FINDINGS: A total of approximately 300 mL of LEFT pleural fluid was  removed. Samples were sent to the laboratory as requested by the clinical team. IMPRESSION: Successful ultrasound guided LEFT thoracentesis yielding 300 mL of pleural fluid. Electronically Signed   By: Ulyses Southward M.D.   On: 05/21/2018 15:57    Labs:  CBC: Recent Labs    05/24/18 0422 05/25/18 0423 05/26/18 0412 05/27/18 0422  WBC 6.5 4.5 5.0 4.4  HGB 8.0* 7.7* 7.9* 8.0*  HCT 27.3* 25.5* 26.0* 26.4*  PLT 171 153 165 158    COAGS: Recent Labs    05/25/18 0423 05/26/18 0412 05/26/18 1220 05/27/18 0422  INR  --   --   --  1.20  APTT 96* 131* 68*  --     BMP: Recent Labs    05/23/18 0600 05/24/18 0422 05/25/18 0423 05/26/18 0412  NA 140  141 138 140 138  K 5.6*  5.6* 3.1* 2.8* 3.7  CL 90*  90* 89* 93* 95*  CO2 42*  44* 39* 40* 38*  GLUCOSE 158*  161* 121* 115* 113*  BUN 39*  39* 48* 38* 31*  CALCIUM 9.0  9.1 8.5* 8.6* 8.5*  CREATININE 1.67*  1.59* 1.19* 0.85 0.82  GFRNONAA 27*  28* 40* >60 >60  GFRAA 31*  33* 47* >60 >60    LIVER FUNCTION TESTS: Recent Labs    05/23/18 0600 05/26/18 0412  BILITOT 0.7 0.8  AST 15 14*  ALT 18 17  ALKPHOS 42 32*  PROT 6.5 5.2*  ALBUMIN 3.8 2.9*    TUMOR MARKERS: No results for input(s): AFPTM, CEA, CA199, CHROMGRNA in the last 8760 hours.  Assessment and Plan:  Left empyema Not candidate for thoracotomy per Thoracic Surgery per note Scheduled for IR to place left pigtail catheter chest tube Risks and benefits discussed with the patient including bleeding, infection, damage to adjacent structures, and sepsis.  All of the patient's questions were answered, patient is agreeable to proceed. Consent signed and in chart.   Thank you for this interesting consult.  I greatly enjoyed meeting [name] and look forward to participating in their care.  A copy of this report was sent to the requesting provider on this date.  Electronically Signed: Robet LeuURPIN,Roshonda Sperl A, PA-C 05/27/2018, 12:39 PM   I spent a  total of 40 Minutes    in face to face in clinical consultation, greater than 50% of which was counseling/coordinating care for left pigtail chest tube placement

## 2018-05-27 NOTE — Sedation Documentation (Signed)
Arrival from [facility]. Settled into nurses station. Monitored, in atrial fib. On 2L [place]. Comfortable. No family here with her.

## 2018-05-27 NOTE — Procedures (Signed)
  Procedure: LEFT chest tube placement 70F EBL:   minimal Complications:  none immediate  See full dictation in [facility] P[facility].  Thora Lance. Jisella Ashenfelter MD Main # [phone] Pager  [phone]

## 2018-05-28 LAB — CULTURE, BLOOD (ROUTINE X 2)
CULTURE: NO GROWTH
Culture: NO GROWTH
SPECIAL REQUESTS: ADEQUATE
Special Requests: ADEQUATE

## 2018-05-28 LAB — CBC
HCT: 26.7 % — ABNORMAL LOW (ref 36.0–46.0)
Hemoglobin: 8 g/dL — ABNORMAL LOW (ref 12.0–15.0)
MCH: 28.1 pg (ref 26.0–34.0)
MCHC: 30 g/dL (ref 30.0–36.0)
MCV: 93.7 fL (ref 78.0–100.0)
PLATELETS: 164 10*3/uL (ref 150–400)
RBC: 2.85 MIL/uL — AB (ref 3.87–5.11)
RDW: 14.3 % (ref 11.5–15.5)
WBC: 5.6 10*3/uL (ref 4.0–10.5)

## 2018-05-28 LAB — HEPARIN LEVEL (UNFRACTIONATED): HEPARIN UNFRACTIONATED: 0.51 [IU]/mL (ref 0.30–0.70)

## 2018-05-28 MED ORDER — FAMOTIDINE 20 MG PO TABS
20.0000 mg | ORAL_TABLET | Freq: Every day | ORAL | Status: DC
  Administered 2018-05-29 – 2018-06-10 (×13): 20 mg via ORAL
  Filled 2018-05-28 (×13): qty 1

## 2018-05-28 MED ORDER — APIXABAN 5 MG PO TABS
5.0000 mg | ORAL_TABLET | Freq: Two times a day (BID) | ORAL | Status: DC
  Administered 2018-05-28 – 2018-06-10 (×27): 5 mg via ORAL
  Filled 2018-05-28 (×2): qty 1
  Filled 2018-05-28: qty 2
  Filled 2018-05-28: qty 1
  Filled 2018-05-28: qty 2
  Filled 2018-05-28 (×2): qty 1
  Filled 2018-05-28: qty 2
  Filled 2018-05-28 (×3): qty 1
  Filled 2018-05-28: qty 2
  Filled 2018-05-28 (×2): qty 1
  Filled 2018-05-28: qty 2
  Filled 2018-05-28 (×2): qty 1
  Filled 2018-05-28: qty 2
  Filled 2018-05-28: qty 1
  Filled 2018-05-28 (×2): qty 2
  Filled 2018-05-28 (×7): qty 1

## 2018-05-28 NOTE — Progress Notes (Addendum)
Patient ID: Simone CuriaVirginia L Huizinga, female   DOB: 02/11/1931, 84 y.o.   MRN: [id number]  8/8 procedure: Procedure:      LEFT chest tube placement 13F EBL:                minimal Complications:            none immediate  OP 500 cc yesterday Site is clean and dry NT No bleeding No air leak noted No CXR today  Plan: can restart anticoagulation today Drain to stay in place til OP is less than 10 cc daily Follow CXR daily  IR will continue to follow by phone

## 2018-05-28 NOTE — Progress Notes (Signed)
PHARMACIST - PHYSICIAN COMMUNICATION  DR:   Ardyth HarpsHernandez  CONCERNING: IV- to- Oral Route Change Policy  RECOMMENDATION: This patient is receiving famotidine by the intravenous route.  Based on criteria approved by the Pharmacy and Therapeutics Committee, the intravenous medication(s) is/are being converted to the equivalent oral dose form(s).   DESCRIPTION: These criteria include:  The patient is eating (either orally or via tube) and/or has been taking other orally administered medications for a least 24 hours  The patient has no evidence of active gastrointestinal bleeding or impaired GI absorption (gastrectomy, short bowel, patient on TNA or NPO).  If you have questions about this conversion, please contact the Pharmacy Department  [x]   [phone]( [phone] )  [facility] []   [phone]( [phone] )  [facility] []   [phone]( [phone] )  [facility] []   [phone]( [phone]  [facility] []   [phone]( [phone] )  [facility]   McNary[place]ara Bryona Foxworthy, [place] D. Clinical Pharmacist 05/28/2018 9:17 AM

## 2018-05-28 NOTE — Progress Notes (Signed)
PROGRESS NOTE    [name]  BJY:[id number] DOB: 07/24/1931 DOA: 05/21/2018 PCP: Benita StabileHall, John Z, MD     Brief Narrative:  84 year old woman admitted from home on 8/2 with complaints of dyspnea.  She has a history of COPD, combined heart failure, hypertension, paroxysmal atrial fibrillation, sinus node dysfunction, diabetes and coronary artery disease.  She states her shortness of breath has been ongoing for about 1 month but has significantly worsened over the past 2 to 3 days prior to admission.  She visited the emergency department on July 13 and was discharged with prednisone and nebulizers for a presumed COPD exacerbation.  She then had a follow-up with her PCP who diagnosed her with pneumonia and started on antibiotics.  Because of no improvement she was referred to the emergency department for evaluation.  Chest x-ray showed a left pleural effusion, subsequently underwent thoracentesis on 8/2 which removed 300 cc of fluid which was obviously exudative with greater than 1000 WBCs and cultures growing coag negative staph and actinomyces.  Case was discussed with thoracic surgery who did not believe she was an adequate candidate for thoracotomy and has instead recommended IR consultation for placement of pigtail catheter which was performed on 8/8.   Assessment & Plan:   Active Problems:   Essential hypertension, benign   Pleural effusion on left   Atrial fibrillation with RVR (HCC)   Goals of care, counseling/discussion   Chronic respiratory failure with hypoxia and hypercapnia (HCC)   Acute on chronic combined systolic and diastolic CHF (congestive heart failure) (HCC)   Chronic respiratory failure with hypoxia (HCC)   Pressure injury of skin   Acute respiratory failure with hypoxia and hypercarbia (HCC)   Acute metabolic encephalopathy   Lobar pneumonia (HCC)   Pulmonary actinomycosis (HCC)   Empyema, left (HCC)   Acute on chronic respiratory failure with hypoxia and  hypercarbia -Multifactorial in the setting of hospital-acquired pneumonia, exudative pleural effusion, congestive heart failure and COPD. -She uses BiPAP at nighttime, continue.  Hospital-acquired pneumonia/exudative pleural effusion -With sensitivities of pleural fluid culture, will narrow antibiotics to Rocephin only which should be sufficient coverage for both coag negative staph and actinomyces. -Vancomycin discontinued as of 8/7. -Plan for pigtail catheter placement on 8/8.   -As per plan by radiology, will leave drain in place until daily output is less than 10 cc daily, they recommend a daily chest x-ray.  Acute metabolic encephalopathy -Resolved and at baseline, daughter at baseline confirms. -Suspect due to hypercarbic respiratory failure as well as infectious process. -Continue BiPAP at bedtime.  Paroxysmal atrial fibrillation -Currently rate controlled. -Resume Eliquis today.  Sinus node dysfunction -Status post permanent pacemaker  Coronary artery disease -Stable, no chest pain -Continue bisoprolol.  Type 2 diabetes -Well-controlled.  Acute on chronic kidney disease stage III -Baseline creatinine is around 1, creatinine peaked at 1.67. -Resolved and down to baseline of 0.82 on 8/7.   DVT prophylaxis: Heparin drip Code Status: DNR  Family Communication: daughter at bedside updated on plan of care and all questions answered on 8/8. Disposition Plan: Transfer to floor, per PT evaluation will need SNF placement  Consultants:   None  Procedures:   As above  Antimicrobials:  Anti-infectives (From admission, onward)   Start     Dose/Rate Route Frequency Ordered Stop   05/25/18 1500  cefTRIAXone (ROCEPHIN) 2 g in sodium chloride 0.9 % 100 mL IVPB     2 g 200 mL/hr over 30 Minutes Intravenous Every 24 hours 05/25/18 1345  05/24/18 0900  vancomycin (VANCOCIN) IVPB 750 mg/150 ml premix  Status:  Discontinued     750 mg 150 mL/hr over 60 Minutes Intravenous  Every 24 hours 05/23/18 0741 05/26/18 1350   05/24/18 0800  ceFEPIme (MAXIPIME) 1 g in sodium chloride 0.9 % 100 mL IVPB  Status:  Discontinued     1 g 200 mL/hr over 30 Minutes Intravenous Every 24 hours 05/23/18 0740 05/25/18 1345   05/23/18 0830  vancomycin (VANCOCIN) 1,500 mg in sodium chloride 0.9 % 500 mL IVPB     1,500 mg 250 mL/hr over 120 Minutes Intravenous  Once 05/23/18 0737 05/23/18 1212   05/23/18 0830  ceFEPIme (MAXIPIME) 2 g in sodium chloride 0.9 % 100 mL IVPB     2 g 200 mL/hr over 30 Minutes Intravenous  Once 05/23/18 0739 05/23/18 1100       Subjective: In bed, no complaints, denies pain at chest tube insertion site, denies chest pain or shortness of breath.  Objective: Vitals:   05/28/18 0906 05/28/18 1000 05/28/18 1426 05/28/18 1435  BP:  105/66  (!) 96/46  Pulse:  (!) 104  96  Resp:  (!) 23  20  Temp:    98.5 F (36.9 C)  TempSrc:    Oral  SpO2: 98% 96% 94% 96%  Weight:      Height:        Intake/Output Summary (Last 24 hours) at 05/28/2018 1618 Last data filed at 05/28/2018 1200 Gross per 24 hour  Intake 653.69 ml  Output 1150 ml  Net -496.31 ml   Filed Weights   05/26/18 0500 05/27/18 0500 05/28/18 0400  Weight: 67.8 kg 67.8 kg 68.7 kg    Examination:  General exam: Alert, awake, oriented x 3 Respiratory system: Clear to auscultation. Respiratory effort normal. Cardiovascular system:RRR. No murmurs, rubs, gallops. Gastrointestinal system: Abdomen is nondistended, soft and nontender. No organomegaly or masses felt. Normal bowel sounds heard. Central nervous system: Alert and oriented. No focal neurological deficits. Extremities: No C/C/E, +pedal pulses Skin: No rashes, lesions or ulcers Psychiatry: Judgement and insight appear normal. Mood & affect appropriate.       Data Reviewed: I have personally reviewed following labs and imaging studies  CBC: Recent Labs  Lab 05/23/18 0600 05/24/18 0422 05/25/18 0423 05/26/18 0412  05/27/18 0422 05/28/18 0502  WBC 9.6 6.5 4.5 5.0 4.4 5.6  NEUTROABS 8.0*  --   --   --   --   --   HGB 9.4* 8.0* 7.7* 7.9* 8.0* 8.0*  HCT 34.5* 27.3* 25.5* 26.0* 26.4* 26.7*  MCV 102.4* 97.2 93.4 93.9 93.3 93.7  PLT 223 171 153 165 158 164   Basic Metabolic Panel: Recent Labs  Lab 05/22/18 0603 05/23/18 0600 05/24/18 0422 05/25/18 0423 05/26/18 0412  NA 142 140  141 138 140 138  K 4.8 5.6*  5.6* 3.1* 2.8* 3.7  CL 93* 90*  90* 89* 93* 95*  CO2 43* 42*  44* 39* 40* 38*  GLUCOSE 143* 158*  161* 121* 115* 113*  BUN 25* 39*  39* 48* 38* 31*  CREATININE 0.93 1.67*  1.59* 1.19* 0.85 0.82  CALCIUM 8.9 9.0  9.1 8.5* 8.6* 8.5*   GFR: Estimated Creatinine Clearance: 47.7 mL/min (by C-G formula based on SCr of 0.82 mg/dL). Liver Function Tests: Recent Labs  Lab 05/23/18 0600 05/26/18 0412  AST 15 14*  ALT 18 17  ALKPHOS 42 32*  BILITOT 0.7 0.8  PROT 6.5 5.2*  ALBUMIN 3.8 2.9*  No results for input(s): LIPASE, AMYLASE in the last 168 hours. Recent Labs  Lab 05/23/18 0600  AMMONIA 22   Coagulation Profile: Recent Labs  Lab 05/27/18 0422  INR 1.20   Cardiac Enzymes: Recent Labs  Lab 05/23/18 0600  TROPONINI <0.03   BNP (last 3 results) No results for input(s): PROBNP in the last 8760 hours. HbA1C: No results for input(s): HGBA1C in the last 72 hours. CBG: Recent Labs  Lab 05/27/18 1101  GLUCAP 133*   Lipid Profile: No results for input(s): CHOL, HDL, LDLCALC, TRIG, CHOLHDL, LDLDIRECT in the last 72 hours. Thyroid Function Tests: No results for input(s): TSH, T4TOTAL, FREET4, T3FREE, THYROIDAB in the last 72 hours. Anemia Panel: No results for input(s): VITAMINB12, FOLATE, FERRITIN, TIBC, IRON, RETICCTPCT in the last 72 hours. Urine analysis:    Component Value Date/Time   COLORURINE YELLOW 05/23/2018 1250   APPEARANCEUR HAZY (A) 05/23/2018 1250   LABSPEC 1.018 05/23/2018 1250   PHURINE 5.0 05/23/2018 1250   GLUCOSEU NEGATIVE 05/23/2018 1250    HGBUR NEGATIVE 05/23/2018 1250   BILIRUBINUR NEGATIVE 05/23/2018 1250   KETONESUR 5 (A) 05/23/2018 1250   PROTEINUR 30 (A) 05/23/2018 1250   NITRITE NEGATIVE 05/23/2018 1250   LEUKOCYTESUR NEGATIVE 05/23/2018 1250   Sepsis Labs: @LABRCNTIP (procalcitonin:4,lacticidven:4)  ) Recent Results (from the past 240 hour(s))  Culture, body fluid-bottle     Status: Abnormal   Collection Time: 05/21/18  3:05 PM  Result Value Ref Range Status   Specimen Description   Final    THORACENTESIS Performed at [facility], [address], [place], [place] [address]    Special Requests   Final    BOTTLES DRAWN AEROBIC AND ANAEROBIC 10CC Performed at [facility], [address] [place], [place] [address]    Gram Stain   Final    GRAM POSITIVE COCCI GRAM VARIABLE ROD Gram Stain Report Called to,Read Back By and Verified With: MURPHY @1121  ON [id number] BY HENDERSON L.    Culture (A)  Final    STAPHYLOCOCCUS CAPITIS ACTINOMYCES SPECIES Standardized susceptibility testing for this organism is not available. Performed at [facility], [address], [place], [place] [address]    Report Status 05/27/2018 FINAL  Final   Organism ID, Bacteria STAPHYLOCOCCUS CAPITIS  Final      Susceptibility   Staphylococcus capitis - MIC*    CIPROFLOXACIN <=0.5 SENSITIVE Sensitive     ERYTHROMYCIN <=0.25 SENSITIVE Sensitive     GENTAMICIN <=0.5 SENSITIVE Sensitive     OXACILLIN <=0.25 SENSITIVE Sensitive     TETRACYCLINE <=1 SENSITIVE Sensitive     VANCOMYCIN 1 SENSITIVE Sensitive     TRIMETH/SULFA <=10 SENSITIVE Sensitive     CLINDAMYCIN <=0.25 SENSITIVE Sensitive     RIFAMPIN <=0.5 SENSITIVE Sensitive     Inducible Clindamycin NEGATIVE Sensitive     * STAPHYLOCOCCUS CAPITIS  Culture, blood (Routine X 2) w Reflex to ID Panel     Status: None   Collection Time: 05/23/18  6:00 AM  Result Value Ref Range Status   Specimen Description   Final    LEFT ANTECUBITAL BOTTLES DRAWN AEROBIC AND ANAEROBIC    Special Requests Blood Culture adequate volume  Final   Culture   Final    NO GROWTH 5 DAYS Performed at [facility], [address] [place], [place] [address]    Report Status 05/28/2018 FINAL  Final  Culture, blood (Routine X 2) w Reflex to ID Panel     Status: None   Collection  Time: 05/23/18  8:45 AM  Result Value Ref Range Status   Specimen Description   Final    RIGHT ANTECUBITAL BOTTLES DRAWN AEROBIC AND ANAEROBIC   Special Requests Blood Culture adequate volume  Final   Culture   Final    NO GROWTH 5 DAYS Performed at [facility], [address] [place], [place] [address]    Report Status 05/28/2018 FINAL  Final  Culture, Urine     Status: Abnormal   Collection Time: 05/23/18 12:50 PM  Result Value Ref Range Status   Specimen Description   Final    URINE, CLEAN CATCH Performed at [facility], [address] [place], [place] [address]    Special Requests   Final    NONE Performed at [facility], [address] [place], [place] [address]    Culture (A)  Final    <10,000 COLONIES/mL INSIGNIFICANT GROWTH Performed at [facility], [address], [place], [place] [address]    Report Status 05/25/2018 FINAL  Final  MRSA PCR Screening     Status: None   Collection Time: 05/23/18  1:00 PM  Result Value Ref Range Status   MRSA by PCR NEGATIVE NEGATIVE Final    Comment:        The GeneXpert MRSA Assay (FDA approved for NASAL specimens only), is one component of a comprehensive MRSA colonization surveillance program. It is not intended to diagnose MRSA infection nor to guide or monitor treatment for MRSA infections. Performed at [facility], [address], [place], [place] [address]          Radiology Studies: Ir Perc Pleural Horace Porteous Eliezer Bottom Cath W/img Guide  Result Date: 05/27/2018 CLINICAL DATA:  Left empyema with residual effusion. Drainage requested. EXAM: INSERTION OF  PLEURAL DRAINAGE CATHETER ANESTHESIA/SEDATION: Intravenous Fentanyl and Versed  were administered as conscious sedation during continuous monitoring of the patient's level of consciousness and physiological / cardiorespiratory status by the radiology RN, with a total moderate sedation time of 6 minutes. MEDICATIONS: No periprocedural antibiotics indicated FLUOROSCOPY TIME:  6 seconds; 1 mGy PROCEDURE: The procedure, risks, benefits, and alternatives were explained to the patient. Questions regarding the procedure were encouraged and answered. The patient understands and consents to the procedure. Under ultrasound, an appropriate skin entry site to approach the left pleural effusion was localized and marked. The left chest wall was prepped with Betadine in a sterile fashion, and a sterile drape was applied covering the operative field. A sterile gown and sterile gloves were used for the procedure. Local anesthesia was provided with 1% Lidocaine. Ultrasound image documentation was performed. Fluoroscopy during the procedure and fluoroscopic spot radiograph confirms appropriate catheter position. After creating a small skin incision, a 19 gauge needle was advanced into the pleural cavity under ultrasound guidance. A guide wire was then advanced under fluoroscopy into the pleural space. Guidewire position confirmed under fluoroscopy. Pleural access was dilated serially and a 12 French pigtail catheter was placed. Catheter was connected to externally [place]ara Pleur-evac device. Catheter was secured externally with 0 Prolene suture and covered with Vaseline gauze and sterile gauze dressing. Fluoroscopic spot image confirms good catheter position. The patient tolerated the procedure well. COMPLICATIONS: None. FINDINGS: Residual pleural effusion at the left lung base was confirmed with ultrasound. 12 French pigtail drain catheter placed as above. IMPRESSION: 1. Technically successful left pleural drain placement. Electronically Signed   By: Corlis Leak M.D.   On: 05/27/2018 16:18        Scheduled  Meds: . apixaban  5 mg Oral BID  . bisoprolol  2.5 mg Oral Daily  . budesonide (PULMICORT) nebulizer solution  0.5 mg Nebulization BID  . chlorhexidine  15 mL Mouth Rinse BID  . Chlorhexidine Gluconate Cloth  6 each Topical Daily  . [START ON 05/29/2018] famotidine  20 mg Oral Daily  . furosemide  40 mg Oral Daily  . ipratropium-albuterol  3 mL Nebulization TID  . mouth rinse  15 mL Mouth Rinse q12n4p  . predniSONE  50 mg Oral Q breakfast  . sodium chloride flush  10-40 mL Intracatheter Q12H  . sodium chloride flush  3 mL Intravenous Q12H   Continuous Infusions: . sodium chloride 250 mL (05/28/18 1615)  . cefTRIAXone (ROCEPHIN)  IV 2 g (05/28/18 1614)     LOS: 7 days    Time spent: 25 minutes.     Chaya Jan, MD [facility] Pager [phone]  If 7PM-7AM, please contact night-coverage [email or web address] Password [email or web address] 05/28/2018, 4:18 PM

## 2018-05-28 NOTE — Evaluation (Signed)
Physical Therapy Evaluation Patient Details Name: [name] MRN: [id number] DOB: 05-28-1931 Today's Date: 05/28/2018   History of Present Illness  [name] is a 84 y.o. female with medical history of COPD, systolic and diastolic CHF, hypertension, paroxysmal atrial for ablation, sinus node dysfunction, diabetes mellitus, coronary artery disease presenting with 1 month history of shortness of breath that has significantly worsened over the past 2 days.  The patient visited the emergency department on 05/01/2018.  She was discharged from the emergency department with prednisone for COPD exacerbation.  There was no improvement with prednisone.  She will follow-up with her primary care provider.  She was given a diagnosis of pneumonia and started on antibiotics which she has completed at least 1 week prior to this admission.  Unfortunately, the patient shortness of breath continued to worsen.  Primarily, her shortness of breath is with exertion, but it has worsened to the point where she is having shortness of breath at rest.  She denies any fevers, chills, chest pain, nausea, vomiting, diarrhea, abdominal pain, dysuria, hematuria.  She has a nonproductive cough.  The patient endorses compliance with all her medications.  The patient states that she has had some increase in her lower extremity edema as well as increasing abdominal girth.  She also notes some PND type symptoms at nighttime.  In the emergency department, the patient was afebrile hemodynamically stable saturating 100% on 2.5 L.  BNP was 787.  Potassium is 5.3.  Chest x-ray showed a left pleural effusion which is slightly worse.  The patient was given furosemide 40 mg IV x1.  CBC was essentially unremarkable with hemoglobin near baseline.  EKG showed atrial fibrillation with right bundle branch block  Clinical Impression  [name] is a very cooperative pt who states that she lives alone but has not been up for a week.  She had mod  independent bed mobility and transfers but became slightly light headed and hot after being up for less than five minutes.  She will benefit from continued skilled physical therapy and short term SNF placement.    Follow Up Recommendations SNF    Equipment Recommendations  None recommended by PT    Recommendations for Other Services   OT    Precautions / Restrictions Precautions Precautions: Fall Restrictions Weight Bearing Restrictions: No      Mobility  Bed Mobility Overal bed mobility: Modified Independent                Transfers Overall transfer level: Modified independent Equipment used: Rolling walker (2 wheeled)             General transfer comment: PT becoming hot and dizzy after being up from the [facility] states she has not been up for a week  Ambulation/Gait                Stairs            Wheelchair Mobility    Modified Rankin (Stroke Patients Only)             Pertinent Vitals/Pain Pain Assessment: No/denies pain    Home Living  Alone; ambulates with rolling walker; daughter brings her meals.                       Prior Function   household ambulation with rolling walker.               Hand Dominance  Extremity/Trunk Assessment        Lower Extremity Assessment Lower Extremity Assessment: Generalized weakness       Communication      Cognition Arousal/Alertness: Awake/alert Behavior During Therapy: WFL for tasks assessed/performed Overall Cognitive Status: Within Functional Limits for tasks assessed                                               Assessment/Plan    PT Assessment Patient needs continued PT services  PT Problem List Decreased strength;Decreased activity tolerance;Decreased balance       PT Treatment Interventions Gait training;Functional mobility training;Therapeutic activities;Therapeutic exercise;Balance training    PT Goals (Current goals can be  found in the Care Plan section)  Acute Rehab PT Goals Patient Stated Goal: To be stronger and be able to walk again.   PT Goal Formulation: With patient Time For Goal Achievement: 06/11/18 Potential to Achieve Goals: Good    Frequency Min 3X/week   Barriers to discharge              End of Session Equipment Utilized During Treatment: Gait belt Activity Tolerance: Patient limited by fatigue Patient left: in bed;with call bell/phone within reach;with bed alarm set Nurse Communication: Mobility status PT Visit Diagnosis: Unsteadiness on feet (R26.81);Muscle weakness (generalized) (M62.81)    Time: 1330-1404 PT Time Calculation (min) (ACUTE ONLY): 34 min   Charges:   PT Evaluation $PT Eval Moderate Complexity: 1 Mod            Virgina OrganCynthia Emannuel Vise, PT CLT [phone] 05/28/2018, 2:11 PM

## 2018-05-28 NOTE — Progress Notes (Addendum)
ANTICOAGULATION CONSULT NOTE - follow up  Pharmacy Consult for: apixaban dosing Indication: Atrial fibrillation  Allergies  Allergen Reactions  . Codeine Nausea Only  . Heparin Other (See Comments)    Causes internal bleeding     Patient Measurements: Height: 5\' 4"  (162.6 cm) Weight: 151 lb 7.3 oz (68.7 kg) IBW/kg (Calculated) : 54.7 HEPARIN DW (KG): 68.8   Vital Signs: Temp: 97.9 F (36.6 C) (08/09 0800) Temp Source: Oral (08/09 0800) BP: 109/69 (08/09 0900) Pulse Rate: 107 (08/09 0900)  Labs: Recent Labs    05/26/18 0412 05/26/18 1220 05/27/18 0422 05/28/18 0502  HGB 7.9*  --  8.0* 8.0*  HCT 26.0*  --  26.4* 26.7*  PLT 165  --  158 164  APTT 131* 68*  --   --   LABPROT  --   --  15.1  --   INR  --   --  1.20  --   HEPARINUNFRC >2.20* 0.81* 0.61 0.51  CREATININE 0.82  --   --   --    Estimated Creatinine Clearance: 47.7 mL/min (by C-G formula based on SCr of 0.82 mg/dL).  Assessment: Pharmacy consulted to re-start apixaban and discontinue heparin. Patient was taking 5mg  bid, which is appropriate for her weight, age and Scr.  Goal of Therapy:  Anti-coagulation and prevention of clots   Plan:  Re-start apixaban 5mg  bid Stop heparin drip one hour after dose is given. Monitor for signs and symptoms of bleeding.  Tama Highamara Eevie Lapp, Pharm. D. Clinical Pharmacist 05/28/2018 11:02 AM

## 2018-05-28 NOTE — Care Management Important Message (Signed)
Important Message  Patient Details  Name: Simone CuriaVirginia L Yaklin MRN: [id number] Date of Birth: 05/27/1931   Medicare Important Message Given:  Yes    Renie OraHawkins, Eudelia Hiltunen Smith 05/28/2018, 10:05 AM

## 2018-05-28 NOTE — Progress Notes (Signed)
Alert and oriented.  Denies shortness of breath.  Chest tube intact and dressing dry with drainage system in place.  Friend and sister in law at bedside.

## 2018-05-29 ENCOUNTER — Inpatient Hospital Stay (HOSPITAL_COMMUNITY): Payer: Medicare Other

## 2018-05-29 LAB — BASIC METABOLIC PANEL
Anion gap: 7 (ref 5–15)
BUN: 25 mg/dL — AB (ref 8–23)
CO2: 36 mmol/L — ABNORMAL HIGH (ref 22–32)
Calcium: 8.7 mg/dL — ABNORMAL LOW (ref 8.9–10.3)
Chloride: 97 mmol/L — ABNORMAL LOW (ref 98–111)
Creatinine, Ser: 0.76 mg/dL (ref 0.44–1.00)
GFR calc Af Amer: >60 mL/min (ref >60)
GLUCOSE: 94 mg/dL (ref 70–99)
POTASSIUM: 3.4 mmol/L — AB (ref 3.5–5.1)
Sodium: 140 mmol/L (ref 135–145)

## 2018-05-29 LAB — CBC
HEMATOCRIT: 27.6 % — AB (ref 36.0–46.0)
Hemoglobin: 8.3 g/dL — ABNORMAL LOW (ref 12.0–15.0)
MCH: 28 pg (ref 26.0–34.0)
MCHC: 30.1 g/dL (ref 30.0–36.0)
MCV: 93.2 fL (ref 78.0–100.0)
Platelets: 178 10*3/uL (ref 150–400)
RBC: 2.96 MIL/uL — ABNORMAL LOW (ref 3.87–5.11)
RDW: 14.3 % (ref 11.5–15.5)
WBC: 5.8 10*3/uL (ref 4.0–10.5)

## 2018-05-29 MED ORDER — DOXYCYCLINE HYCLATE 100 MG PO TABS
100.0000 mg | ORAL_TABLET | Freq: Two times a day (BID) | ORAL | Status: AC
  Administered 2018-05-29 – 2018-06-02 (×10): 100 mg via ORAL
  Filled 2018-05-29 (×11): qty 1

## 2018-05-29 NOTE — Progress Notes (Signed)
PROGRESS NOTE    [name]  GNF:[id number] DOB: Jul 19, 1931 DOA: 05/21/2018 PCP: Benita Stabile, MD     Brief Narrative:  84 year old woman admitted from home on 8/2 with complaints of dyspnea.  She has a history of COPD, combined heart failure, hypertension, paroxysmal atrial fibrillation, sinus node dysfunction, diabetes and coronary artery disease.  She states her shortness of breath has been ongoing for about 1 month but has significantly worsened over the past 2 to 3 days prior to admission.  She visited the emergency department on July 13 and was discharged with prednisone and nebulizers for a presumed COPD exacerbation.  She then had a follow-up with her PCP who diagnosed her with pneumonia and started on antibiotics.  Because of no improvement she was referred to the emergency department for evaluation.  Chest x-ray showed a left pleural effusion, subsequently underwent thoracentesis on 8/2 which removed 300 cc of fluid which was obviously exudative with greater than 1000 WBCs and cultures growing coag negative staph and actinomyces.  Case was discussed with thoracic surgery who did not believe she was an adequate candidate for thoracotomy and has instead recommended IR consultation for placement of pigtail catheter which was performed on 8/8.   Assessment & Plan:   Active Problems:   Essential hypertension, benign   Pleural effusion on left   Atrial fibrillation with RVR (HCC)   Goals of care, counseling/discussion   Chronic respiratory failure with hypoxia and hypercapnia (HCC)   Acute on chronic combined systolic and diastolic CHF (congestive heart failure) (HCC)   Chronic respiratory failure with hypoxia (HCC)   Pressure injury of skin   Acute respiratory failure with hypoxia and hypercarbia (HCC)   Acute metabolic encephalopathy   Lobar pneumonia (HCC)   Pulmonary actinomycosis (HCC)   Empyema, left (HCC)   Acute on chronic respiratory failure with hypoxia and  hypercarbia -Multifactorial in the setting of hospital-acquired pneumonia, exudative pleural effusion, congestive heart failure and COPD. -She uses BiPAP at nighttime, continue.  Hospital-acquired pneumonia/exudative pleural effusion -With resultsof pleural fluid culture, will narrow antibiotics to doxycycline which should be sufficient coverage for both coag negative staph and actinomyces. -Vancomycin discontinued as of 8/7.  Rocephin discontinued on 8/10. -Patient had chest tube placed by interventional radiology on 8/8. -Significantly decreased drain output today, only 10 to 20 cc, repeat chest x-ray does show increase in pleural effusions. -I have discussed this with IR, Dr. Fredia Sorrow, he believes that the x-ray is of poor quality and is not sure that this truly represents an increased effusion and recommends a 2 view chest x-ray in the morning as long as patient remains clinically well to determine whether chest tube needs to be repositioned.  If patient's clinical status deteriorates, then he recommends CT scan for further evaluation.  Acute metabolic encephalopathy -Resolved and at baseline, daughter at baseline confirms. -Suspect due to hypercarbic respiratory failure as well as infectious process. -Continue BiPAP at bedtime.  Paroxysmal atrial fibrillation -Currently rate controlled. -Resume Eliquis today.  Sinus node dysfunction -Status post permanent pacemaker  Coronary artery disease -Stable, no chest pain -Continue bisoprolol.  Type 2 diabetes -Well-controlled.  Acute on chronic kidney disease stage III -Baseline creatinine is around 1, creatinine peaked at 1.67. -Resolved and down to baseline of 0.82 on 8/7. Cr is 0.76 on 8/10.   DVT prophylaxis: Eliquis Code Status: DNR  Family Communication: friend at bedside updated on plan of care and all questions answered. Disposition Plan: Transfer to floor, per PT evaluation  will need SNF placement  Consultants:    IR  Procedures:   Chest tube placement on 8/8  Antimicrobials:  Anti-infectives (From admission, onward)   Start     Dose/Rate Route Frequency Ordered Stop   05/25/18 1500  cefTRIAXone (ROCEPHIN) 2 g in sodium chloride 0.9 % 100 mL IVPB     2 g 200 mL/hr over 30 Minutes Intravenous Every 24 hours 05/25/18 1345     05/24/18 0900  vancomycin (VANCOCIN) IVPB 750 mg/150 ml premix  Status:  Discontinued     750 mg 150 mL/hr over 60 Minutes Intravenous Every 24 hours 05/23/18 0741 05/26/18 1350   05/24/18 0800  ceFEPIme (MAXIPIME) 1 g in sodium chloride 0.9 % 100 mL IVPB  Status:  Discontinued     1 g 200 mL/hr over 30 Minutes Intravenous Every 24 hours 05/23/18 0740 05/25/18 1345   05/23/18 0830  vancomycin (VANCOCIN) 1,500 mg in sodium chloride 0.9 % 500 mL IVPB     1,500 mg 250 mL/hr over 120 Minutes Intravenous  Once 05/23/18 0737 05/23/18 1212   05/23/18 0830  ceFEPIme (MAXIPIME) 2 g in sodium chloride 0.9 % 100 mL IVPB     2 g 200 mL/hr over 30 Minutes Intravenous  Once 05/23/18 0739 05/23/18 1100       Subjective: In bed, denies chest pain or shortness of breath, wants to know when she can go home.  Objective: Vitals:   05/29/18 0801 05/29/18 0809 05/29/18 1415 05/29/18 1441  BP:   (!) 107/51   Pulse:   (!) 105   Resp:   17   Temp:   98.8 F (37.1 C)   TempSrc:   Oral   SpO2: (!) 86% 95% 97% 97%  Weight:      Height:        Intake/Output Summary (Last 24 hours) at 05/29/2018 1527 Last data filed at 05/29/2018 1311 Gross per 24 hour  Intake 675 ml  Output 320 ml  Net 355 ml   Filed Weights   05/27/18 0500 05/28/18 0400 05/29/18 0717  Weight: 67.8 kg 68.7 kg 68.6 kg    Examination:  General exam: Alert, awake, oriented x 3 Respiratory system: Decreased breath sounds at the bases Cardiovascular system:RRR. No murmurs, rubs, gallops. Gastrointestinal system: Abdomen is nondistended, soft and nontender. No organomegaly or masses felt. Normal bowel sounds  heard. Central nervous system: Alert and oriented. No focal neurological deficits. Extremities: No C/C/E, +pedal pulses Skin: No rashes, lesions or ulcers Psychiatry: Judgement and insight appear normal. Mood & affect appropriate.        Data Reviewed: I have personally reviewed following labs and imaging studies  CBC: Recent Labs  Lab 05/23/18 0600  05/25/18 0423 05/26/18 0412 05/27/18 0422 05/28/18 0502 05/29/18 0628  WBC 9.6   < > 4.5 5.0 4.4 5.6 5.8  NEUTROABS 8.0*  --   --   --   --   --   --   HGB 9.4*   < > 7.7* 7.9* 8.0* 8.0* 8.3*  HCT 34.5*   < > 25.5* 26.0* 26.4* 26.7* 27.6*  MCV 102.4*   < > 93.4 93.9 93.3 93.7 93.2  PLT 223   < > 153 165 158 164 178   < > = values in this interval not displayed.   Basic Metabolic Panel: Recent Labs  Lab 05/23/18 0600 05/24/18 0422 05/25/18 0423 05/26/18 0412 05/29/18 0628  NA 140  141 138 140 138 140  K 5.6*  5.6*  3.1* 2.8* 3.7 3.4*  CL 90*  90* 89* 93* 95* 97*  CO2 42*  44* 39* 40* 38* 36*  GLUCOSE 158*  161* 121* 115* 113* 94  BUN 39*  39* 48* 38* 31* 25*  CREATININE 1.67*  1.59* 1.19* 0.85 0.82 0.76  CALCIUM 9.0  9.1 8.5* 8.6* 8.5* 8.7*   GFR: Estimated Creatinine Clearance: 48.9 mL/min (by C-G formula based on SCr of 0.76 mg/dL). Liver Function Tests: Recent Labs  Lab 05/23/18 0600 05/26/18 0412  AST 15 14*  ALT 18 17  ALKPHOS 42 32*  BILITOT 0.7 0.8  PROT 6.5 5.2*  ALBUMIN 3.8 2.9*   No results for input(s): LIPASE, AMYLASE in the last 168 hours. Recent Labs  Lab 05/23/18 0600  AMMONIA 22   Coagulation Profile: Recent Labs  Lab 05/27/18 0422  INR 1.20   Cardiac Enzymes: Recent Labs  Lab 05/23/18 0600  TROPONINI <0.03   BNP (last 3 results) No results for input(s): PROBNP in the last 8760 hours. HbA1C: No results for input(s): HGBA1C in the last 72 hours. CBG: Recent Labs  Lab 05/27/18 1101  GLUCAP 133*   Lipid Profile: No results for input(s): CHOL, HDL, LDLCALC, TRIG,  CHOLHDL, LDLDIRECT in the last 72 hours. Thyroid Function Tests: No results for input(s): TSH, T4TOTAL, FREET4, T3FREE, THYROIDAB in the last 72 hours. Anemia Panel: No results for input(s): VITAMINB12, FOLATE, FERRITIN, TIBC, IRON, RETICCTPCT in the last 72 hours. Urine analysis:    Component Value Date/Time   COLORURINE YELLOW 05/23/2018 1250   APPEARANCEUR HAZY (A) 05/23/2018 1250   LABSPEC 1.018 05/23/2018 1250   PHURINE 5.0 05/23/2018 1250   GLUCOSEU NEGATIVE 05/23/2018 1250   HGBUR NEGATIVE 05/23/2018 1250   BILIRUBINUR NEGATIVE 05/23/2018 1250   KETONESUR 5 (A) 05/23/2018 1250   PROTEINUR 30 (A) 05/23/2018 1250   NITRITE NEGATIVE 05/23/2018 1250   LEUKOCYTESUR NEGATIVE 05/23/2018 1250   Sepsis Labs: @LABRCNTIP (procalcitonin:4,lacticidven:4)  ) Recent Results (from the past 240 hour(s))  Culture, body fluid-bottle     Status: Abnormal   Collection Time: 05/21/18  3:05 PM  Result Value Ref Range Status   Specimen Description   Final    THORACENTESIS Performed at [facility], [address] [place], [place] [address]    Special Requests   Final    BOTTLES DRAWN AEROBIC AND ANAEROBIC 10CC Performed at [facility], [address] [place], [place] [address]    Gram Stain   Final    GRAM POSITIVE COCCI GRAM VARIABLE ROD Gram Stain Report Called to,Read Back By and Verified With: MURPHY @1121  ON [id number] BY HENDERSON L.    Culture (A)  Final    STAPHYLOCOCCUS CAPITIS ACTINOMYCES SPECIES Standardized susceptibility testing for this organism is not available. Performed at [facility], [address] [place], [place] [address]    Report Status 05/27/2018 FINAL  Final   Organism ID, Bacteria STAPHYLOCOCCUS CAPITIS  Final      Susceptibility   Staphylococcus capitis - MIC*    CIPROFLOXACIN <=0.5 SENSITIVE Sensitive     ERYTHROMYCIN <=0.25 SENSITIVE Sensitive     GENTAMICIN <=0.5 SENSITIVE Sensitive     OXACILLIN <=0.25 SENSITIVE Sensitive     TETRACYCLINE  <=1 SENSITIVE Sensitive     VANCOMYCIN 1 SENSITIVE Sensitive     TRIMETH/SULFA <=10 SENSITIVE Sensitive     CLINDAMYCIN <=0.25 SENSITIVE Sensitive     RIFAMPIN <=0.5 SENSITIVE Sensitive     Inducible Clindamycin NEGATIVE Sensitive     * STAPHYLOCOCCUS  CAPITIS  Culture, blood (Routine X 2) w Reflex to ID Panel     Status: None   Collection Time: 05/23/18  6:00 AM  Result Value Ref Range Status   Specimen Description   Final    LEFT ANTECUBITAL BOTTLES DRAWN AEROBIC AND ANAEROBIC   Special Requests Blood Culture adequate volume  Final   Culture   Final    NO GROWTH 5 DAYS Performed at [facility], [address], [place], [place] [address]    Report Status 05/28/2018 FINAL  Final  Culture, blood (Routine X 2) w Reflex to ID Panel     Status: None   Collection Time: 05/23/18  8:45 AM  Result Value Ref Range Status   Specimen Description   Final    RIGHT ANTECUBITAL BOTTLES DRAWN AEROBIC AND ANAEROBIC   Special Requests Blood Culture adequate volume  Final   Culture   Final    NO GROWTH 5 DAYS Performed at [facility], [address], [place], [place] [address]    Report Status 05/28/2018 FINAL  Final  Culture, Urine     Status: Abnormal   Collection Time: 05/23/18 12:50 PM  Result Value Ref Range Status   Specimen Description   Final    URINE, CLEAN CATCH Performed at [facility], [address] [place], [place] [address]    Special Requests   Final    NONE Performed at [facility], [address], [place], [place] [address]    Culture (A)  Final    <10,000 COLONIES/mL INSIGNIFICANT GROWTH Performed at [facility], [address] [place], [place] [address]    Report Status 05/25/2018 FINAL  Final  MRSA PCR Screening     Status: None   Collection Time: 05/23/18  1:00 PM  Result Value Ref Range Status   MRSA by PCR NEGATIVE NEGATIVE Final    Comment:        The GeneXpert MRSA Assay (FDA approved for NASAL specimens only), is one component of a comprehensive  MRSA colonization surveillance program. It is not intended to diagnose MRSA infection nor to guide or monitor treatment for MRSA infections. Performed at [facility], [address] [place], [place] [address]          Radiology Studies: Dg Chest [facility] 1 View  Result Date: 05/29/2018 CLINICAL DATA:  Follow-up chest x-ray.  Pleural effusion. EXAM: PORTABLE CHEST 1 VIEW COMPARISON:  May 23, 2018 FINDINGS: Stable cardiomegaly. Bilateral pleural effusions with underlying opacities, worsened in the interval. Stable right PICC line. No other changes. IMPRESSION: Increasing bilateral pleural effusions with underlying opacities. Electronically Signed   By: Gerome Sam III M.D   On: 05/29/2018 08:17        Scheduled Meds: . apixaban  5 mg Oral BID  . bisoprolol  2.5 mg Oral Daily  . budesonide (PULMICORT) nebulizer solution  0.5 mg Nebulization BID  . chlorhexidine  15 mL Mouth Rinse BID  . Chlorhexidine Gluconate Cloth  6 each Topical Daily  . famotidine  20 mg Oral Daily  . furosemide  40 mg Oral Daily  . ipratropium-albuterol  3 mL Nebulization TID  . mouth rinse  15 mL Mouth Rinse q12n4p  . predniSONE  50 mg Oral Q breakfast  . sodium chloride flush  10-40 mL Intracatheter Q12H  . sodium chloride flush  3 mL Intravenous Q12H   Continuous Infusions: . sodium chloride 250 mL (05/28/18 1615)  . cefTRIAXone (ROCEPHIN)  IV Stopped (05/28/18 1644)  LOS: 8 days    Time spent: 25 minutes.     Chaya Jan, MD [facility] Pager [phone]  If 7PM-7AM, please contact night-coverage [email or web address] Password [facility] 05/29/2018, 3:27 PM

## 2018-05-30 ENCOUNTER — Inpatient Hospital Stay (HOSPITAL_COMMUNITY): Payer: Medicare Other

## 2018-05-30 MED ORDER — POTASSIUM CHLORIDE CRYS ER 20 MEQ PO TBCR
40.0000 meq | EXTENDED_RELEASE_TABLET | Freq: Once | ORAL | Status: AC
  Administered 2018-05-30: 40 meq via ORAL
  Filled 2018-05-30: qty 2

## 2018-05-30 NOTE — Progress Notes (Signed)
PROGRESS NOTE    [name]  ONG:[id number] DOB: Jun 04, 1931 DOA: 05/21/2018 PCP: Benita Stabile, MD     Brief Narrative:  84 year old woman admitted from home on 8/2 with complaints of dyspnea.  She has a history of COPD, combined heart failure, hypertension, paroxysmal atrial fibrillation, sinus node dysfunction, diabetes and coronary artery disease.  She states her shortness of breath has been ongoing for about 1 month but has significantly worsened over the past 2 to 3 days prior to admission.  She visited the emergency department on July 13 and was discharged with prednisone and nebulizers for a presumed COPD exacerbation.  She then had a follow-up with her PCP who diagnosed her with pneumonia and started on antibiotics.  Because of no improvement she was referred to the emergency department for evaluation.  Chest x-ray showed a left pleural effusion, subsequently underwent thoracentesis on 8/2 which removed 300 cc of fluid which was obviously exudative with greater than 1000 WBCs and cultures growing coag negative staph and actinomyces.  Case was discussed with thoracic surgery who did not believe she was an adequate candidate for thoracotomy and has instead recommended IR consultation for placement of pigtail catheter which was performed on 8/8.   Assessment & Plan:   Active Problems:   Essential hypertension, benign   Pleural effusion on left   Atrial fibrillation with RVR (HCC)   Goals of care, counseling/discussion   Chronic respiratory failure with hypoxia and hypercapnia (HCC)   Acute on chronic combined systolic and diastolic CHF (congestive heart failure) (HCC)   Chronic respiratory failure with hypoxia (HCC)   Pressure injury of skin   Acute respiratory failure with hypoxia and hypercarbia (HCC)   Acute metabolic encephalopathy   Lobar pneumonia (HCC)   Pulmonary actinomycosis (HCC)   Empyema, left (HCC)   Acute on chronic respiratory failure with hypoxia and  hypercarbia -Multifactorial in the setting of hospital-acquired pneumonia, exudative pleural effusion, congestive heart failure and COPD. -She uses BiPAP at nighttime, continue. -Significantly improved.  Hospital-acquired pneumonia/exudative pleural effusion -With results of pleural fluid culture, antibiotics have been narrowed to doxycycline which should be sufficient coverage for both coag negative staph and actinomyces. -Vancomycin discontinued as of 8/7.  Rocephin discontinued on 8/10. -Patient had chest tube placed by interventional radiology on 8/8. -With patient repositioning, tube has again started draining. 110 cc past 24 hours. -Plan to keep tube in for 2 days after output decreases to less than 10 cc/24 hours. -2 view xray from today shows improvement in size of pleural effusion.  Acute metabolic encephalopathy -Resolved and at baseline, daughter at baseline confirms. -Suspect due to hypercarbic respiratory failure as well as infectious process. -Continue BiPAP at bedtime.  Paroxysmal atrial fibrillation -Currently rate controlled. -Eliquis has been resumed after chest tube placement.  Sinus node dysfunction -Status post permanent pacemaker  Coronary artery disease -Stable, no chest pain -Continue bisoprolol.  Type 2 diabetes -Well-controlled.  Acute on chronic kidney disease stage III -Baseline creatinine is around 1, creatinine peaked at 1.67. -Resolved and down to baseline of 0.82 on 8/7. Cr is 0.76 on 8/10.   DVT prophylaxis: Eliquis Code Status: DNR  Family Communication: daughter at bedside updated on plan of care and all questions answered. Disposition Plan: SNF pending medical stability. SW aware.  Consultants:   IR  Procedures:   Chest tube placement on 8/8  Antimicrobials:  Anti-infectives (From admission, onward)   Start     Dose/Rate Route Frequency Ordered Stop   05/29/18  1545  doxycycline (VIBRA-TABS) tablet 100 mg     100 mg Oral Every  12 hours 05/29/18 1530     05/25/18 1500  cefTRIAXone (ROCEPHIN) 2 g in sodium chloride 0.9 % 100 mL IVPB  Status:  Discontinued     2 g 200 mL/hr over 30 Minutes Intravenous Every 24 hours 05/25/18 1345 05/29/18 1530   05/24/18 0900  vancomycin (VANCOCIN) IVPB 750 mg/150 ml premix  Status:  Discontinued     750 mg 150 mL/hr over 60 Minutes Intravenous Every 24 hours 05/23/18 0741 05/26/18 1350   05/24/18 0800  ceFEPIme (MAXIPIME) 1 g in sodium chloride 0.9 % 100 mL IVPB  Status:  Discontinued     1 g 200 mL/hr over 30 Minutes Intravenous Every 24 hours 05/23/18 0740 05/25/18 1345   05/23/18 0830  vancomycin (VANCOCIN) 1,500 mg in sodium chloride 0.9 % 500 mL IVPB     1,500 mg 250 mL/hr over 120 Minutes Intravenous  Once 05/23/18 0737 05/23/18 1212   05/23/18 0830  ceFEPIme (MAXIPIME) 2 g in sodium chloride 0.9 % 100 mL IVPB     2 g 200 mL/hr over 30 Minutes Intravenous  Once 05/23/18 0739 05/23/18 1100       Subjective: Lying in bed, denies CP or SOB. States she had a bad night; did not sleep well.   Objective: Vitals:   05/29/18 2036 05/29/18 2226 05/30/18 0632 05/30/18 0721  BP:  113/73 122/61   Pulse:  98 74   Resp:  18 18   Temp:  99 F (37.2 C) 97.6 F (36.4 C)   TempSrc:  Oral Oral   SpO2: 96% 96% 99% 98%  Weight:   68.7 kg   Height:        Intake/Output Summary (Last 24 hours) at 05/30/2018 1136 Last data filed at 05/30/2018 0900 Gross per 24 hour  Intake 987.5 ml  Output 110 ml  Net 877.5 ml   Filed Weights   05/28/18 0400 05/29/18 0717 05/30/18 0632  Weight: 68.7 kg 68.6 kg 68.7 kg    Examination:  General exam: Alert, awake, oriented x 3 Respiratory system: Decreased breath sounds to bilateral bases. Respiratory effort normal. Cardiovascular system:RRR. No murmurs, rubs, gallops. Gastrointestinal system: Abdomen is nondistended, soft and nontender. No organomegaly or masses felt. Normal bowel sounds heard. Central nervous system: Alert and oriented.  No focal neurological deficits. Extremities: No C/C/E, +pedal pulses Skin: No rashes, lesions or ulcers Psychiatry: Judgement and insight appear normal. Mood & affect appropriate.         Data Reviewed: I have personally reviewed following labs and imaging studies  CBC: Recent Labs  Lab 05/25/18 0423 05/26/18 0412 05/27/18 0422 05/28/18 0502 05/29/18 0628  WBC 4.5 5.0 4.4 5.6 5.8  HGB 7.7* 7.9* 8.0* 8.0* 8.3*  HCT 25.5* 26.0* 26.4* 26.7* 27.6*  MCV 93.4 93.9 93.3 93.7 93.2  PLT 153 165 158 164 178   Basic Metabolic Panel: Recent Labs  Lab 05/24/18 0422 05/25/18 0423 05/26/18 0412 05/29/18 0628  NA 138 140 138 140  K 3.1* 2.8* 3.7 3.4*  CL 89* 93* 95* 97*  CO2 39* 40* 38* 36*  GLUCOSE 121* 115* 113* 94  BUN 48* 38* 31* 25*  CREATININE 1.19* 0.85 0.82 0.76  CALCIUM 8.5* 8.6* 8.5* 8.7*   GFR: Estimated Creatinine Clearance: 48.9 mL/min (by C-G formula based on SCr of 0.76 mg/dL). Liver Function Tests: Recent Labs  Lab 05/26/18 0412  AST 14*  ALT 17  ALKPHOS  32*  BILITOT 0.8  PROT 5.2*  ALBUMIN 2.9*   No results for input(s): LIPASE, AMYLASE in the last 168 hours. No results for input(s): AMMONIA in the last 168 hours. Coagulation Profile: Recent Labs  Lab 05/27/18 0422  INR 1.20   Cardiac Enzymes: No results for input(s): CKTOTAL, CKMB, CKMBINDEX, TROPONINI in the last 168 hours. BNP (last 3 results) No results for input(s): PROBNP in the last 8760 hours. HbA1C: No results for input(s): HGBA1C in the last 72 hours. CBG: Recent Labs  Lab 05/27/18 1101  GLUCAP 133*   Lipid Profile: No results for input(s): CHOL, HDL, LDLCALC, TRIG, CHOLHDL, LDLDIRECT in the last 72 hours. Thyroid Function Tests: No results for input(s): TSH, T4TOTAL, FREET4, T3FREE, THYROIDAB in the last 72 hours. Anemia Panel: No results for input(s): VITAMINB12, FOLATE, FERRITIN, TIBC, IRON, RETICCTPCT in the last 72 hours. Urine analysis:    Component Value Date/Time     COLORURINE YELLOW 05/23/2018 1250   APPEARANCEUR HAZY (A) 05/23/2018 1250   LABSPEC 1.018 05/23/2018 1250   PHURINE 5.0 05/23/2018 1250   GLUCOSEU NEGATIVE 05/23/2018 1250   HGBUR NEGATIVE 05/23/2018 1250   BILIRUBINUR NEGATIVE 05/23/2018 1250   KETONESUR 5 (A) 05/23/2018 1250   PROTEINUR 30 (A) 05/23/2018 1250   NITRITE NEGATIVE 05/23/2018 1250   LEUKOCYTESUR NEGATIVE 05/23/2018 1250   Sepsis Labs: @LABRCNTIP (procalcitonin:4,lacticidven:4)  ) Recent Results (from the past 240 hour(s))  Culture, body fluid-bottle     Status: Abnormal   Collection Time: 05/21/18  3:05 PM  Result Value Ref Range Status   Specimen Description   Final    THORACENTESIS Performed at [facility], [address] [place], [place] [address]    Special Requests   Final    BOTTLES DRAWN AEROBIC AND ANAEROBIC 10CC Performed at [facility], [address] [place], [place] [address]    Gram Stain   Final    GRAM POSITIVE COCCI GRAM VARIABLE ROD Gram Stain Report Called to,Read Back By and Verified With: MURPHY @1121  ON [id number] BY HENDERSON L.    Culture (A)  Final    STAPHYLOCOCCUS CAPITIS ACTINOMYCES SPECIES Standardized susceptibility testing for this organism is not available. Performed at [facility], [address] [place], [place] [address]    Report Status 05/27/2018 FINAL  Final   Organism ID, Bacteria STAPHYLOCOCCUS CAPITIS  Final      Susceptibility   Staphylococcus capitis - MIC*    CIPROFLOXACIN <=0.5 SENSITIVE Sensitive     ERYTHROMYCIN <=0.25 SENSITIVE Sensitive     GENTAMICIN <=0.5 SENSITIVE Sensitive     OXACILLIN <=0.25 SENSITIVE Sensitive     TETRACYCLINE <=1 SENSITIVE Sensitive     VANCOMYCIN 1 SENSITIVE Sensitive     TRIMETH/SULFA <=10 SENSITIVE Sensitive     CLINDAMYCIN <=0.25 SENSITIVE Sensitive     RIFAMPIN <=0.5 SENSITIVE Sensitive     Inducible Clindamycin NEGATIVE Sensitive     * STAPHYLOCOCCUS CAPITIS  Culture, blood (Routine X 2) w Reflex to ID Panel      Status: None   Collection Time: 05/23/18  6:00 AM  Result Value Ref Range Status   Specimen Description   Final    LEFT ANTECUBITAL BOTTLES DRAWN AEROBIC AND ANAEROBIC   Special Requests Blood Culture adequate volume  Final   Culture   Final    NO GROWTH 5 DAYS Performed at [facility], [address] [place], [place] [address]    Report Status 05/28/2018 FINAL  Final  Culture, blood (Routine X 2)  w Reflex to ID Panel     Status: None   Collection Time: 05/23/18  8:45 AM  Result Value Ref Range Status   Specimen Description   Final    RIGHT ANTECUBITAL BOTTLES DRAWN AEROBIC AND ANAEROBIC   Special Requests Blood Culture adequate volume  Final   Culture   Final    NO GROWTH 5 DAYS Performed at [facility], [address], [place], [place] [address]    Report Status 05/28/2018 FINAL  Final  Culture, Urine     Status: Abnormal   Collection Time: 05/23/18 12:50 PM  Result Value Ref Range Status   Specimen Description   Final    URINE, CLEAN CATCH Performed at [facility], [address] [place], [place] [address]    Special Requests   Final    NONE Performed at [facility], [address] [place], [place] [address]    Culture (A)  Final    <10,000 COLONIES/mL INSIGNIFICANT GROWTH Performed at [facility], [address] [place], [place] [address]    Report Status 05/25/2018 FINAL  Final  MRSA PCR Screening     Status: None   Collection Time: 05/23/18  1:00 PM  Result Value Ref Range Status   MRSA by PCR NEGATIVE NEGATIVE Final    Comment:        The GeneXpert MRSA Assay (FDA approved for NASAL specimens only), is one component of a comprehensive MRSA colonization surveillance program. It is not intended to diagnose MRSA infection nor to guide or monitor treatment for MRSA infections. Performed at [facility], [address] [place], [place] [address]          Radiology Studies: Dg Chest 2 View  Result Date: 05/30/2018 CLINICAL DATA:  History of  pleural effusions and status post pigtail drainage catheter placement to drain a left pleural effusion on 05/27/2018. EXAM: CHEST - 2 VIEW COMPARISON:  Portable chest x-ray on 05/29/2018 FINDINGS: The left basilar pleural drainage catheter is partially visualized on the frontal projection. In the lateral projection, good visualization posterior sulcus although left supports likely adequate pleural fluid drainage with no significant pleural fluid. There probably is some component a small amount of pleural fluid on the right. No overt edema present. No pneumothorax. Stable cardiac enlargement. Stable right upper extremity PICC line positioning with the catheter tip in the distal SVC. Stable appearance of pacemaker. IMPRESSION: Based on the lateral chest x-ray, there likely is not a significant amount of residual left pleural fluid after pigtail catheter drainage. A small amount of pleural fluid is suspected on the right. No overt edema. Electronically Signed   By: Irish Lack M.D.   On: 05/30/2018 10:10   Dg Chest Port 1 View  Result Date: 05/29/2018 CLINICAL DATA:  Follow-up chest x-ray.  Pleural effusion. EXAM: PORTABLE CHEST 1 VIEW COMPARISON:  May 23, 2018 FINDINGS: Stable cardiomegaly. Bilateral pleural effusions with underlying opacities, worsened in the interval. Stable right PICC line. No other changes. IMPRESSION: Increasing bilateral pleural effusions with underlying opacities. Electronically Signed   By: Gerome Sam III M.D   On: 05/29/2018 08:17        Scheduled Meds: . apixaban  5 mg Oral BID  . bisoprolol  2.5 mg Oral Daily  . budesonide (PULMICORT) nebulizer solution  0.5 mg Nebulization BID  . chlorhexidine  15 mL Mouth Rinse BID  . doxycycline  100 mg Oral Q12H  . famotidine  20 mg Oral Daily  . furosemide  40 mg Oral Daily  . ipratropium-albuterol  3 mL Nebulization TID  . mouth rinse  15 mL Mouth Rinse q12n4p  . predniSONE  50 mg Oral Q breakfast  . sodium chloride  flush  10-40 mL Intracatheter Q12H  . sodium chloride flush  3 mL Intravenous Q12H   Continuous Infusions: . sodium chloride Stopped (05/29/18 1900)     LOS: 9 days    Time spent: 25 minutes.     Chaya Jan, MD [facility] Pager [phone]  If 7PM-7AM, please contact night-coverage [email or web address] Password [facility] 05/30/2018, 11:36 AM

## 2018-05-30 NOTE — Progress Notes (Signed)
Patient ID: Simone CuriaVirginia L Gervase, female   DOB: 11/03/1930, 84 y.o.   MRN: [id number] Pt s/p drainage of left chest fluid collection/effusion 8/8; afebrile; last WBC nl; hgb stable; drain output 110 cc yesterday, minimal today; CXR today without sig amount of residual left pleural fluid, small amt on right. Once output minimal (< 10-15 cc per day for about 48 hrs) consider removal. If pt's status worsens obtain CT chest. Will cont to monitor.

## 2018-05-30 NOTE — [place] FL2 (Signed)
[place] MEDICAID FL2 LEVEL OF CARE SCREENING TOOL     IDENTIFICATION  Patient Name: Kathy CuriaVirginia L Baumgardner Birthdate: 08/25/1931 Sex: female Admission Date (Current Location): 05/21/2018  [facility] [place]   Facility and Address:  [facility], [address], [place], [place] [address]      Provider Number: [id number]  Attending Physician Name and Address:  Philip AspenHernandez Acosta, Minerva EndsEstela*  Relative Name and Phone Number:       Current Level of Care: Hospital Recommended Level of Care: Skilled Nursing Facility Prior Approval Number:    Date Approved/Denied:   PASRR Number: [id number]  Discharge Plan: SNF    Current Diagnoses: Patient Active Problem List   Diagnosis Date Noted  . Pulmonary actinomycosis (HCC) 05/25/2018  . Empyema, left (HCC)   . Pressure injury of skin 05/23/2018  . Acute respiratory failure with hypoxia and hypercarbia (HCC) 05/23/2018  . Acute metabolic encephalopathy 05/23/2018  . Lobar pneumonia (HCC) 05/23/2018  . Acute on chronic combined systolic and diastolic CHF (congestive heart failure) (HCC) 05/21/2018  . Chronic respiratory failure with hypoxia (HCC) 05/21/2018  . UTI (urinary tract infection) 06/25/2016  . Symptomatic bradycardia 06/21/2016  . Digoxin toxicity 06/21/2016  . Chronic respiratory failure with hypoxia and hypercapnia (HCC) 06/13/2016  . Diarrhea 04/07/2016  . Anemia 04/07/2016  . Localized edema 04/07/2016  . Combined congestive systolic and diastolic heart failure (HCC) 03/20/2016  . Palliative care encounter   . Goals of care, counseling/discussion   . Dyspnea   . Atrial fibrillation with RVR (HCC)   . Pleural effusion on left 03/09/2016  . Atrial fibrillation (HCC) 03/09/2016  . Hyperlipidemia   . Type 2 diabetes mellitus (HCC) 12/26/2009  . HYPERLIPIDEMIA 12/26/2009  . Essential hypertension, benign 12/26/2009  . Coronary atherosclerosis of native coronary artery 12/26/2009  .  COPD (chronic obstructive pulmonary disease) (HCC) 12/26/2009    Orientation RESPIRATION BLADDER Height & Weight     Self, Time, Situation, Place  Other (Comment), O2([place] 2L; bipap at night) Incontinent Weight: 151 lb 7.3 oz (68.7 kg) Height:  5\' 4"  (162.6 cm)  BEHAVIORAL SYMPTOMS/MOOD NEUROLOGICAL BOWEL NUTRITION STATUS      Continent Diet(heart healthy)  AMBULATORY STATUS COMMUNICATION OF NEEDS Skin   Limited Assist Verbally PU Stage and Appropriate Care PU Stage 1 Dressing: (sacrum, foam dressing changed PRN)                     Personal Care Assistance Level of Assistance  Bathing, Feeding, Dressing Bathing Assistance: Limited assistance Feeding assistance: Independent Dressing Assistance: Limited assistance     Functional Limitations Info  Sight, Hearing, Speech Sight Info: Impaired(wears glasses) Hearing Info: Adequate Speech Info: Adequate    SPECIAL CARE FACTORS FREQUENCY  PT (By licensed PT), OT (By licensed OT)     PT Frequency: 5x/wk OT Frequency: 5x/wk            Contractures Contractures Info: Not present    Additional Factors Info  Code Status, Allergies Code Status Info: DNR Allergies Info: Codeine, Heparin           Current Medications (05/30/2018):  This is the current hospital active medication list Current Facility-Administered Medications  Medication Dose Route Frequency Provider Last Rate Last Dose  . 0.9 %  sodium chloride infusion  250 mL Intravenous PRN Philip AspenHernandez Acosta, Limmie PatriciaEstela Y, MD   Stopped at 05/29/18 1900  . acetaminophen (TYLENOL) tablet 650 mg  650 mg Oral Q4H PRN Ardyth HarpsHernandez  Priscella Mann, MD   650 mg at 05/22/18 0743  . apixaban (ELIQUIS) tablet 5 mg  5 mg Oral BID Philip Aspen, Limmie Patricia, MD   5 mg at 05/30/18 0836  . bisoprolol (ZEBETA) tablet 2.5 mg  2.5 mg Oral Daily Philip Aspen, Limmie Patricia, MD   2.5 mg at 05/30/18 0835  . budesonide (PULMICORT) nebulizer solution 0.5 mg  0.5 mg Nebulization BID Philip Aspen,  Limmie Patricia, MD   0.5 mg at 05/30/18 0721  . chlorhexidine (PERIDEX) 0.12 % solution 15 mL  15 mL Mouth Rinse BID Philip Aspen, Limmie Patricia, MD   15 mL at 05/30/18 0836  . doxycycline (VIBRA-TABS) tablet 100 mg  100 mg Oral Q12H Philip Aspen, Limmie Patricia, MD   100 mg at 05/30/18 0835  . famotidine (PEPCID) tablet 20 mg  20 mg Oral Daily Philip Aspen, Limmie Patricia, MD   20 mg at 05/30/18 0836  . furosemide (LASIX) tablet 40 mg  40 mg Oral Daily Philip Aspen, Limmie Patricia, MD   40 mg at 05/30/18 0835  . ipratropium-albuterol (DUONEB) 0.5-2.5 (3) MG/3ML nebulizer solution 3 mL  3 mL Nebulization TID Philip Aspen, Limmie Patricia, MD   3 mL at 05/30/18 1333  . ipratropium-albuterol (DUONEB) 0.5-2.5 (3) MG/3ML nebulizer solution 3 mL  3 mL Nebulization Q4H PRN Philip Aspen, Limmie Patricia, MD      . LORazepam (ATIVAN) injection 0.5 mg  0.5 mg Intravenous QHS PRN Philip Aspen, Limmie Patricia, MD   0.5 mg at 05/29/18 2159  . MEDLINE mouth rinse  15 mL Mouth Rinse q12n4p Philip Aspen, Limmie Patricia, MD   15 mL at 05/30/18 1212  . metoprolol tartrate (LOPRESSOR) injection 2.5 mg  2.5 mg Intravenous Q6H PRN Philip Aspen, Limmie Patricia, MD   2.5 mg at 05/24/18 2152  . ondansetron (ZOFRAN) injection 4 mg  4 mg Intravenous Q6H PRN Philip Aspen, Limmie Patricia, MD   4 mg at 05/22/18 1401  . predniSONE (DELTASONE) tablet 50 mg  50 mg Oral Q breakfast Philip Aspen, Limmie Patricia, MD   50 mg at 05/30/18 0835  . sodium chloride flush (NS) 0.9 % injection 10-40 mL  10-40 mL Intracatheter Q12H Philip Aspen, Limmie Patricia, MD   10 mL at 05/30/18 0836  . sodium chloride flush (NS) 0.9 % injection 10-40 mL  10-40 mL Intracatheter PRN Philip Aspen, Limmie Patricia, MD      . sodium chloride flush (NS) 0.9 % injection 3 mL  3 mL Intravenous Q12H Philip Aspen, Limmie Patricia, MD   3 mL at 05/30/18 0837  . sodium chloride flush (NS) 0.9 % injection 3 mL  3 mL Intravenous PRN Philip Aspen, Limmie Patricia, MD         Discharge  Medications: Please see discharge summary for a list of discharge medications.  Relevant Imaging Results:  Relevant Lab Results:   Additional Information SS#: [id number]  Baldemar Lenis, LCSW

## 2018-05-31 LAB — CBC
HEMATOCRIT: 28.3 % — AB (ref 36.0–46.0)
HEMOGLOBIN: 8.5 g/dL — AB (ref 12.0–15.0)
MCH: 28 pg (ref 26.0–34.0)
MCHC: 30 g/dL (ref 30.0–36.0)
MCV: 93.1 fL (ref 78.0–100.0)
Platelets: 213 10*3/uL (ref 150–400)
RBC: 3.04 MIL/uL — ABNORMAL LOW (ref 3.87–5.11)
RDW: 14.1 % (ref 11.5–15.5)
WBC: 6.7 10*3/uL (ref 4.0–10.5)

## 2018-05-31 LAB — BASIC METABOLIC PANEL
ANION GAP: 7 (ref 5–15)
BUN: 30 mg/dL — ABNORMAL HIGH (ref 8–23)
CHLORIDE: 99 mmol/L (ref 98–111)
CO2: 34 mmol/L — AB (ref 22–32)
Calcium: 8.9 mg/dL (ref 8.9–10.3)
Creatinine, Ser: 0.86 mg/dL (ref 0.44–1.00)
GFR calc Af Amer: >60 mL/min (ref >60)
GFR calc non Af Amer: 60 mL/min — ABNORMAL LOW (ref >60)
GLUCOSE: 96 mg/dL (ref 70–99)
Potassium: 4.2 mmol/L (ref 3.5–5.1)
Sodium: 140 mmol/L (ref 135–145)

## 2018-05-31 MED ORDER — IPRATROPIUM-ALBUTEROL 0.5-2.5 (3) MG/3ML IN SOLN
3.0000 mL | Freq: Two times a day (BID) | RESPIRATORY_TRACT | Status: DC
  Administered 2018-05-31 – 2018-06-05 (×11): 3 mL via RESPIRATORY_TRACT
  Filled 2018-05-31 (×14): qty 3

## 2018-05-31 NOTE — Progress Notes (Signed)
PROGRESS NOTE    [name]  ZOX:[name] DOB: 01/26/1931 DOA: 05/21/2018 PCP: Benita StabileHall, John Z, MD     Brief Narrative:  84 year old woman admitted from home on 8/2 with complaints of dyspnea.  She has a history of COPD, combined heart failure, hypertension, paroxysmal atrial fibrillation, sinus node dysfunction, diabetes and coronary artery disease.  She states her shortness of breath has been ongoing for about 1 month but has significantly worsened over the past 2 to 3 days prior to admission.  She visited the emergency department on July 13 and was discharged with prednisone and nebulizers for a presumed COPD exacerbation.  She then had a follow-up with her PCP who diagnosed her with pneumonia and started on antibiotics.  Because of no improvement she was referred to the emergency department for evaluation.  Chest x-ray showed a left pleural effusion, subsequently underwent thoracentesis on 8/2 which removed 300 cc of fluid which was obviously exudative with greater than 1000 WBCs and cultures growing coag negative staph and actinomyces.  Case was discussed with thoracic surgery who did not believe she was an adequate candidate for thoracotomy and has instead recommended IR consultation for placement of pigtail catheter which was performed on 8/8.   Assessment & Plan:   Active Problems:   Essential hypertension, benign   Pleural effusion on left   Atrial fibrillation with RVR (HCC)   Goals of care, counseling/discussion   Chronic respiratory failure with hypoxia and hypercapnia (HCC)   Acute on chronic combined systolic and diastolic CHF (congestive heart failure) (HCC)   Chronic respiratory failure with hypoxia (HCC)   Pressure injury of skin   Acute respiratory failure with hypoxia and hypercarbia (HCC)   Acute metabolic encephalopathy   Lobar pneumonia (HCC)   Pulmonary actinomycosis (HCC)   Empyema, left (HCC)   Acute on chronic respiratory failure with hypoxia and  hypercarbia -Multifactorial in the setting of hospital-acquired pneumonia, exudative pleural effusion, congestive heart failure and COPD. -She uses BiPAP at nighttime, continue. -Significantly improved.  Hospital-acquired pneumonia/exudative pleural effusion -With results of pleural fluid culture, antibiotics have been narrowed to doxycycline which should be sufficient coverage for both coag negative staph and actinomyces. -Vancomycin discontinued as of 8/7.  Rocephin discontinued on 8/10. -Patient had chest tube placed by interventional radiology on 8/8. -Chest tube drainage: 160 cc past 24 hours -Plan to keep tube in for 2 days after output decreases to less than 10 cc/24 hours. -2 view xray from 8/11 shows improvement in size of pleural effusion.  Acute metabolic encephalopathy -Resolved and at baseline, daughter confirms. -Suspect due to hypercarbic respiratory failure as well as infectious process. -Continue BiPAP at bedtime.  Paroxysmal atrial fibrillation -Currently rate controlled. -Eliquis has been resumed after chest tube placement.  Sinus node dysfunction -Status post permanent pacemaker  Coronary artery disease -Stable, no chest pain -Continue bisoprolol.  Type 2 diabetes -Well-controlled.  Acute on chronic kidney disease stage III -Baseline creatinine is around 1, creatinine peaked at 1.67. -Resolved and down to baseline of 0.82 on 8/7. Cr is 0.76 on 8/10.   DVT prophylaxis: Eliquis Code Status: DNR  Family Communication: patient only Disposition Plan: SNF pending medical stability. SW aware.  Consultants:   IR  Procedures:   Chest tube placement on 8/8  Antimicrobials:  Anti-infectives (From admission, onward)   Start     Dose/Rate Route Frequency Ordered Stop   05/29/18 1545  doxycycline (VIBRA-TABS) tablet 100 mg     100 mg Oral Every 12 hours  05/29/18 1530     05/25/18 1500  cefTRIAXone (ROCEPHIN) 2 g in sodium chloride 0.9 % 100 mL IVPB   Status:  Discontinued     2 g 200 mL/hr over 30 Minutes Intravenous Every 24 hours 05/25/18 1345 05/29/18 1530   05/24/18 0900  vancomycin (VANCOCIN) IVPB 750 mg/150 ml premix  Status:  Discontinued     750 mg 150 mL/hr over 60 Minutes Intravenous Every 24 hours 05/23/18 0741 05/26/18 1350   05/24/18 0800  ceFEPIme (MAXIPIME) 1 g in sodium chloride 0.9 % 100 mL IVPB  Status:  Discontinued     1 g 200 mL/hr over 30 Minutes Intravenous Every 24 hours 05/23/18 0740 05/25/18 1345   05/23/18 0830  vancomycin (VANCOCIN) 1,500 mg in sodium chloride 0.9 % 500 mL IVPB     1,500 mg 250 mL/hr over 120 Minutes Intravenous  Once 05/23/18 0737 05/23/18 1212   05/23/18 0830  ceFEPIme (MAXIPIME) 2 g in sodium chloride 0.9 % 100 mL IVPB     2 g 200 mL/hr over 30 Minutes Intravenous  Once 05/23/18 0739 05/23/18 1100       Subjective: Lying in bed, denies chest pain.  No complaints.   Objective: Vitals:   05/31/18 0631 05/31/18 0633 05/31/18 0741 05/31/18 1300  BP: 100/78   (!) 109/59  Pulse: (!) 105 96  73  Resp: 20   18  Temp: 97.9 F (36.6 C)   98.3 F (36.8 C)  TempSrc: Oral   Oral  SpO2: (!) 72% 100% 99% 99%  Weight:  68.4 kg    Height:        Intake/Output Summary (Last 24 hours) at 05/31/2018 1558 Last data filed at 05/31/2018 1300 Gross per 24 hour  Intake 960 ml  Output 1360 ml  Net -400 ml   Filed Weights   05/29/18 0717 05/30/18 0632 05/31/18 0633  Weight: 68.6 kg 68.7 kg 68.4 kg    Examination:  General exam: Alert, awake, oriented x 3 Respiratory system: Decreased breath sounds to bilateral bases .respiratory effort normal. Cardiovascular system:RRR. No murmurs, rubs, gallops. Gastrointestinal system: Abdomen is nondistended, soft and nontender. No organomegaly or masses felt. Normal bowel sounds heard. Central nervous system: Alert and oriented. No focal neurological deficits. Extremities: No C/C/E, +pedal pulses Skin: No rashes, lesions or ulcers Psychiatry:  Judgement and insight appear normal. Mood & affect appropriate.          Data Reviewed: I have personally reviewed following labs and imaging studies  CBC: Recent Labs  Lab 05/26/18 0412 05/27/18 0422 05/28/18 0502 05/29/18 0628 05/31/18 0437  WBC 5.0 4.4 5.6 5.8 6.7  HGB 7.9* 8.0* 8.0* 8.3* 8.5*  HCT 26.0* 26.4* 26.7* 27.6* 28.3*  MCV 93.9 93.3 93.7 93.2 93.1  PLT 165 158 164 178 213   Basic Metabolic Panel: Recent Labs  Lab 05/25/18 0423 05/26/18 0412 05/29/18 0628 05/31/18 0437  NA 140 138 140 140  K 2.8* 3.7 3.4* 4.2  CL 93* 95* 97* 99  CO2 40* 38* 36* 34*  GLUCOSE 115* 113* 94 96  BUN 38* 31* 25* 30*  CREATININE 0.85 0.82 0.76 0.86  CALCIUM 8.6* 8.5* 8.7* 8.9   GFR: Estimated Creatinine Clearance: 45.5 mL/min (by C-G formula based on SCr of 0.86 mg/dL). Liver Function Tests: Recent Labs  Lab 05/26/18 0412  AST 14*  ALT 17  ALKPHOS 32*  BILITOT 0.8  PROT 5.2*  ALBUMIN 2.9*   No results for input(s): LIPASE, AMYLASE in the last  168 hours. No results for input(s): AMMONIA in the last 168 hours. Coagulation Profile: Recent Labs  Lab 05/27/18 0422  INR 1.20   Cardiac Enzymes: No results for input(s): CKTOTAL, CKMB, CKMBINDEX, TROPONINI in the last 168 hours. BNP (last 3 results) No results for input(s): PROBNP in the last 8760 hours. HbA1C: No results for input(s): HGBA1C in the last 72 hours. CBG: Recent Labs  Lab 05/27/18 1101  GLUCAP 133*   Lipid Profile: No results for input(s): CHOL, HDL, LDLCALC, TRIG, CHOLHDL, LDLDIRECT in the last 72 hours. Thyroid Function Tests: No results for input(s): TSH, T4TOTAL, FREET4, T3FREE, THYROIDAB in the last 72 hours. Anemia Panel: No results for input(s): VITAMINB12, FOLATE, FERRITIN, TIBC, IRON, RETICCTPCT in the last 72 hours. Urine analysis:    Component Value Date/Time   COLORURINE YELLOW 05/23/2018 1250   APPEARANCEUR HAZY (A) 05/23/2018 1250   LABSPEC 1.018 05/23/2018 1250   PHURINE  5.0 05/23/2018 1250   GLUCOSEU NEGATIVE 05/23/2018 1250   HGBUR NEGATIVE 05/23/2018 1250   BILIRUBINUR NEGATIVE 05/23/2018 1250   KETONESUR 5 (A) 05/23/2018 1250   PROTEINUR 30 (A) 05/23/2018 1250   NITRITE NEGATIVE 05/23/2018 1250   LEUKOCYTESUR NEGATIVE 05/23/2018 1250   Sepsis Labs: @LABRCNTIP (procalcitonin:4,lacticidven:4)  ) Recent Results (from the past 240 hour(s))  Culture, blood (Routine X 2) w Reflex to ID Panel     Status: None   Collection Time: 05/23/18  6:00 AM  Result Value Ref Range Status   Specimen Description   Final    LEFT ANTECUBITAL BOTTLES DRAWN AEROBIC AND ANAEROBIC   Special Requests Blood Culture adequate volume  Final   Culture   Final    NO GROWTH 5 DAYS Performed at [facility], [address] [place], [place] [address]    Report Status 05/28/2018 FINAL  Final  Culture, blood (Routine X 2) w Reflex to ID Panel     Status: None   Collection Time: 05/23/18  8:45 AM  Result Value Ref Range Status   Specimen Description   Final    RIGHT ANTECUBITAL BOTTLES DRAWN AEROBIC AND ANAEROBIC   Special Requests Blood Culture adequate volume  Final   Culture   Final    NO GROWTH 5 DAYS Performed at [facility], [address] [place], [place] [address]    Report Status 05/28/2018 FINAL  Final  Culture, Urine     Status: Abnormal   Collection Time: 05/23/18 12:50 PM  Result Value Ref Range Status   Specimen Description   Final    URINE, CLEAN CATCH Performed at [facility], [address] [place], [place] [address]    Special Requests   Final    NONE Performed at [facility], [address] [place], [place] [address]    Culture (A)  Final    <10,000 COLONIES/mL INSIGNIFICANT GROWTH Performed at [facility], [address] [place], [place] [address]    Report Status 05/25/2018 FINAL  Final  MRSA PCR Screening     Status: None   Collection Time: 05/23/18  1:00 PM  Result Value Ref Range Status   MRSA by PCR NEGATIVE NEGATIVE Final     Comment:        The GeneXpert MRSA Assay (FDA approved for NASAL specimens only), is one component of a comprehensive MRSA colonization surveillance program. It is not intended to diagnose MRSA infection nor to guide or monitor treatment for MRSA infections. Performed at [facility], [address] [place], [place] [address]  Radiology Studies: Dg Chest 2 View  Result Date: 05/30/2018 CLINICAL DATA:  History of pleural effusions and status post pigtail drainage catheter placement to drain a left pleural effusion on 05/27/2018. EXAM: CHEST - 2 VIEW COMPARISON:  Portable chest x-ray on 05/29/2018 FINDINGS: The left basilar pleural drainage catheter is partially visualized on the frontal projection. In the lateral projection, good visualization posterior sulcus although left supports likely adequate pleural fluid drainage with no significant pleural fluid. There probably is some component a small amount of pleural fluid on the right. No overt edema present. No pneumothorax. Stable cardiac enlargement. Stable right upper extremity PICC line positioning with the catheter tip in the distal SVC. Stable appearance of pacemaker. IMPRESSION: Based on the lateral chest x-ray, there likely is not a significant amount of residual left pleural fluid after pigtail catheter drainage. A small amount of pleural fluid is suspected on the right. No overt edema. Electronically Signed   By: Irish Lack M.D.   On: 05/30/2018 10:10        Scheduled Meds: . apixaban  5 mg Oral BID  . bisoprolol  2.5 mg Oral Daily  . budesonide (PULMICORT) nebulizer solution  0.5 mg Nebulization BID  . chlorhexidine  15 mL Mouth Rinse BID  . doxycycline  100 mg Oral Q12H  . famotidine  20 mg Oral Daily  . furosemide  40 mg Oral Daily  . ipratropium-albuterol  3 mL Nebulization BID  . mouth rinse  15 mL Mouth Rinse q12n4p  . predniSONE  50 mg Oral Q breakfast  . sodium chloride flush  10-40 mL Intracatheter  Q12H  . sodium chloride flush  3 mL Intravenous Q12H   Continuous Infusions: . sodium chloride Stopped (05/29/18 1900)     LOS: 10 days    Time spent: 25 minutes.     Chaya Jan, MD [facility] Pager [phone]  If 7PM-7AM, please contact night-coverage [email or web address] Password [facility] 05/31/2018, 3:58 PM

## 2018-05-31 NOTE — Progress Notes (Signed)
Physical Therapy Treatment Patient Details Name: Kathy CuriaVirginia L Sermeno MRN: [id number] DOB: 01/25/1931 Today's Date: 05/31/2018    History of Present Illness Kathy CuriaVirginia L Fray is a 84 y.o. female with medical history of COPD, systolic and diastolic CHF, hypertension, paroxysmal atrial for ablation, sinus node dysfunction, diabetes mellitus, coronary artery disease presenting with 1 month history of shortness of breath that has significantly worsened over the past 2 days.  The patient visited the emergency department on 05/01/2018.  She was discharged from the emergency department with prednisone for COPD exacerbation.  There was no improvement with prednisone.  She will follow-up with her primary care provider.  She was given a diagnosis of pneumonia and started on antibiotics which she has completed at least 1 week prior to this admission.  Unfortunately, the patient shortness of breath continued to worsen.  Primarily, her shortness of breath is with exertion, but it has worsened to the point where she is having shortness of breath at rest.  She denies any fevers, chills, chest pain, nausea, vomiting, diarrhea, abdominal pain, dysuria, hematuria.  She has a nonproductive cough.  The patient endorses compliance with all her medications.  The patient states that she has had some increase in her lower extremity edema as well as increasing abdominal girth.  She also notes some PND type symptoms at nighttime.  In the emergency department, the patient was afebrile hemodynamically stable saturating 100% on 2.5 L.  BNP was 787.  Potassium is 5.3.  Chest x-ray showed a left pleural effusion which is slightly worse.  The patient was given furosemide 40 mg IV x1.  CBC was essentially unremarkable with hemoglobin near baseline.  EKG showed atrial fibrillation with right bundle branch block    PT Comments    Pt was fatigued when PT arrived and declined OOB but agreed to there ex.  Her performance was good with some mild  SOB during activity and with short rests did well.  Will continue on with PT to encourage more mobility and asked her to walk with nursing later today.  Follow acutely for endurance and strength.   Follow Up Recommendations  SNF     Equipment Recommendations  None recommended by PT    Recommendations for Other Services       Precautions / Restrictions Precautions Precautions: Fall Precaution Comments: chest tube in place Restrictions Weight Bearing Restrictions: No    Mobility  Bed Mobility Overal bed mobility: Modified Independent                Transfers                 General transfer comment: declined OOB  Ambulation/Gait             General Gait Details: declined OOB   Stairs             Wheelchair Mobility    Modified Rankin (Stroke Patients Only)       Balance                                            Cognition Arousal/Alertness: Awake/alert Behavior During Therapy: WFL for tasks assessed/performed Overall Cognitive Status: Within Functional Limits for tasks assessed  Exercises General Exercises - Lower Extremity Ankle Circles/Pumps: AROM;AAROM;Both;5 reps Quad Sets: AROM;Both;15 reps Gluteal Sets: AROM;Both;15 reps Heel Slides: AROM;Both;15 reps Hip ABduction/ADduction: AROM;Both;15 reps Straight Leg Raises: AROM;Both;10 reps    General Comments        Pertinent Vitals/Pain Pain Assessment: No/denies pain    Home Living                      Prior Function            PT Goals (current goals can now be found in the care plan section) Progress towards PT goals: Progressing toward goals    Frequency    Min 3X/week      PT Plan Current plan remains appropriate    Co-evaluation              AM-PAC PT "6 Clicks" Daily Activity  Outcome Measure  Difficulty turning over in bed (including adjusting bedclothes, sheets  and blankets)?: None Difficulty moving from lying on back to sitting on the side of the bed? : Unable Difficulty sitting down on and standing up from a chair with arms (e.g., wheelchair, bedside commode, etc,.)?: Unable Help needed moving to and from a bed to chair (including a wheelchair)?: A Little Help needed walking in hospital room?: A Little Help needed climbing 3-5 steps with a railing? : A Little 6 Click Score: 15    End of Session   Activity Tolerance: Patient limited by fatigue Patient left: in bed;with call bell/phone within reach;with bed alarm set Nurse Communication: Mobility status PT Visit Diagnosis: Unsteadiness on feet (R26.81);Muscle weakness (generalized) (M62.81)     Time: [id number] PT Time Calculation (min) (ACUTE ONLY): 18 min  Charges:  $Therapeutic Exercise: 8-22 mins                      Ivar DrapeRuth E Khara Renaud 05/31/2018, 11:06 AM   11:07 AM, 05/31/18 Samul Dadauth Junetta Hearn, PT, MS Physical Therapist - [place] [phone](ASCOM)  [phone] (Office)

## 2018-05-31 NOTE — Progress Notes (Signed)
Patient ID: [name], female   DOB: 03/12/1931, 84 y.o.   MRN: [id number]  8/8 procedure: Procedure:LEFT chest tube placement 9F EBL: minimal Complications: none immediate  70 cc OP yesterday Draining well today No air leak per RN  Will continue to follow per phone  When OP less than 10-20 cc daily-- consider removal

## 2018-06-01 NOTE — Progress Notes (Signed)
Pt placed on [facility] BIPAP for sleep. BIPAP is plugged into the red outlet with 2L O2 in line

## 2018-06-01 NOTE — Progress Notes (Signed)
PROGRESS NOTE    Kathy CuriaVirginia L Page  QIO:[id number] DOB: 03/14/1931 DOA: 05/21/2018 PCP: Benita StabileHall, John Z, MD     Brief Narrative:  84 year old woman admitted from home on 8/2 with complaints of dyspnea.  She has a history of COPD, combined heart failure, hypertension, paroxysmal atrial fibrillation, sinus node dysfunction, diabetes and coronary artery disease.  She states her shortness of breath has been ongoing for about 1 month but has significantly worsened over the past 2 to 3 days prior to admission.  She visited the emergency department on July 13 and was discharged with prednisone and nebulizers for a presumed COPD exacerbation.  She then had a follow-up with her PCP who diagnosed her with pneumonia and started on antibiotics.  Because of no improvement she was referred to the emergency department for evaluation.  Chest x-ray showed a left pleural effusion, subsequently underwent thoracentesis on 8/2 which removed 300 cc of fluid which was obviously exudative with greater than 1000 WBCs and cultures growing coag negative staph and actinomyces.  Case was discussed with thoracic surgery who did not believe she was an adequate candidate for thoracotomy and has instead recommended IR consultation for placement of pigtail catheter which was performed on 8/8.   Assessment & Plan:   Active Problems:   Essential hypertension, benign   Pleural effusion on left   Atrial fibrillation with RVR (HCC)   Goals of care, counseling/discussion   Chronic respiratory failure with hypoxia and hypercapnia (HCC)   Acute on chronic combined systolic and diastolic CHF (congestive heart failure) (HCC)   Chronic respiratory failure with hypoxia (HCC)   Pressure injury of skin   Acute respiratory failure with hypoxia and hypercarbia (HCC)   Acute metabolic encephalopathy   Lobar pneumonia (HCC)   Pulmonary actinomycosis (HCC)   Empyema, left (HCC)   Acute on chronic respiratory failure with hypoxia and  hypercarbia -Multifactorial in the setting of hospital-acquired pneumonia, exudative pleural effusion, congestive heart failure and COPD. -She uses BiPAP at nighttime, continue. -Significantly improved.  Hospital-acquired pneumonia/exudative pleural effusion -With results of pleural fluid culture, antibiotics have been narrowed to doxycycline which should be sufficient coverage for both coag negative staph and actinomyces. Continue doxy for TWO more days. -Vancomycin discontinued as of 8/7.  Rocephin discontinued on 8/10. -Patient had chest tube placed by interventional radiology on 8/8. -Chest tube drainage: 160 cc past 24 hours -Plan to keep tube in for 2 days after output decreases to less than 10 cc/24 hours. -2 view xray from 8/11 shows improvement in size of pleural effusion.  Acute metabolic encephalopathy -Resolved and at baseline, daughter confirms. -Suspect due to hypercarbic respiratory failure as well as infectious process. -Continue BiPAP at bedtime.  Paroxysmal atrial fibrillation -Currently rate controlled. -Eliquis has been resumed after chest tube placement.  Sinus node dysfunction -Status post permanent pacemaker  Coronary artery disease -Stable, no chest pain -Continue bisoprolol.  Type 2 diabetes -Well-controlled.  Acute on chronic kidney disease stage III -Baseline creatinine is around 1, creatinine peaked at 1.67. -Resolved and down to baseline of 0.82 on 8/7. Cr is 0.76 on 8/10.   DVT prophylaxis: Eliquis Code Status: DNR  Family Communication: patient only Disposition Plan: SNF pending medical stability. SW aware.  Consultants:   IR  Procedures:   Chest tube placement on 8/8  Antimicrobials:  Anti-infectives (From admission, onward)   Start     Dose/Rate Route Frequency Ordered Stop   05/29/18 1545  doxycycline (VIBRA-TABS) tablet 100 mg  100 mg Oral Every 12 hours 05/29/18 1530     05/25/18 1500  cefTRIAXone (ROCEPHIN) 2 g in sodium  chloride 0.9 % 100 mL IVPB  Status:  Discontinued     2 g 200 mL/hr over 30 Minutes Intravenous Every 24 hours 05/25/18 1345 05/29/18 1530   05/24/18 0900  vancomycin (VANCOCIN) IVPB 750 mg/150 ml premix  Status:  Discontinued     750 mg 150 mL/hr over 60 Minutes Intravenous Every 24 hours 05/23/18 0741 05/26/18 1350   05/24/18 0800  ceFEPIme (MAXIPIME) 1 g in sodium chloride 0.9 % 100 mL IVPB  Status:  Discontinued     1 g 200 mL/hr over 30 Minutes Intravenous Every 24 hours 05/23/18 0740 05/25/18 1345   05/23/18 0830  vancomycin (VANCOCIN) 1,500 mg in sodium chloride 0.9 % 500 mL IVPB     1,500 mg 250 mL/hr over 120 Minutes Intravenous  Once 05/23/18 0737 05/23/18 1212   05/23/18 0830  ceFEPIme (MAXIPIME) 2 g in sodium chloride 0.9 % 100 mL IVPB     2 g 200 mL/hr over 30 Minutes Intravenous  Once 05/23/18 0739 05/23/18 1100       Subjective: In bed, denies chest pain or shortness of breath, no pain at site of chest tube.  No complaints.   Objective: Vitals:   06/01/18 0559 06/01/18 0721 06/01/18 0726 06/01/18 1429  BP: 104/68   108/60  Pulse: 80   85  Resp: 19   18  Temp: 98 F (36.7 C)   98.7 F (37.1 C)  TempSrc:    Oral  SpO2: 93% 98% 98% 98%  Weight: 68.7 kg     Height:        Intake/Output Summary (Last 24 hours) at 06/01/2018 1512 Last data filed at 06/01/2018 1430 Gross per 24 hour  Intake 1090 ml  Output 480 ml  Net 610 ml   Filed Weights   05/30/18 0632 05/31/18 0633 06/01/18 0559  Weight: 68.7 kg 68.4 kg 68.7 kg    Examination:  General exam: Alert, awake, oriented x 3 Respiratory system: Decreased bilateral breath sounds.  Respiratory effort normal. Cardiovascular system:RRR. No murmurs, rubs, gallops. Gastrointestinal system: Abdomen is nondistended, soft and nontender. No organomegaly or masses felt. Normal bowel sounds heard. Central nervous system: Alert and oriented. No focal neurological deficits. Extremities: No C/C/E, +pedal pulses Skin: No  rashes, lesions or ulcers Psychiatry: Judgement and insight appear normal. Mood & affect appropriate.           Data Reviewed: I have personally reviewed following labs and imaging studies  CBC: Recent Labs  Lab 05/26/18 0412 05/27/18 0422 05/28/18 0502 05/29/18 0628 05/31/18 0437  WBC 5.0 4.4 5.6 5.8 6.7  HGB 7.9* 8.0* 8.0* 8.3* 8.5*  HCT 26.0* 26.4* 26.7* 27.6* 28.3*  MCV 93.9 93.3 93.7 93.2 93.1  PLT 165 158 164 178 213   Basic Metabolic Panel: Recent Labs  Lab 05/26/18 0412 05/29/18 0628 05/31/18 0437  NA 138 140 140  K 3.7 3.4* 4.2  CL 95* 97* 99  CO2 38* 36* 34*  GLUCOSE 113* 94 96  BUN 31* 25* 30*  CREATININE 0.82 0.76 0.86  CALCIUM 8.5* 8.7* 8.9   GFR: Estimated Creatinine Clearance: 45.5 mL/min (by C-G formula based on SCr of 0.86 mg/dL). Liver Function Tests: Recent Labs  Lab 05/26/18 0412  AST 14*  ALT 17  ALKPHOS 32*  BILITOT 0.8  PROT 5.2*  ALBUMIN 2.9*   No results for input(s): LIPASE, AMYLASE  in the last 168 hours. No results for input(s): AMMONIA in the last 168 hours. Coagulation Profile: Recent Labs  Lab 05/27/18 0422  INR 1.20   Cardiac Enzymes: No results for input(s): CKTOTAL, CKMB, CKMBINDEX, TROPONINI in the last 168 hours. BNP (last 3 results) No results for input(s): PROBNP in the last 8760 hours. HbA1C: No results for input(s): HGBA1C in the last 72 hours. CBG: Recent Labs  Lab 05/27/18 1101  GLUCAP 133*   Lipid Profile: No results for input(s): CHOL, HDL, LDLCALC, TRIG, CHOLHDL, LDLDIRECT in the last 72 hours. Thyroid Function Tests: No results for input(s): TSH, T4TOTAL, FREET4, T3FREE, THYROIDAB in the last 72 hours. Anemia Panel: No results for input(s): VITAMINB12, FOLATE, FERRITIN, TIBC, IRON, RETICCTPCT in the last 72 hours. Urine analysis:    Component Value Date/Time   COLORURINE YELLOW 05/23/2018 1250   APPEARANCEUR HAZY (A) 05/23/2018 1250   LABSPEC 1.018 05/23/2018 1250   PHURINE 5.0  05/23/2018 1250   GLUCOSEU NEGATIVE 05/23/2018 1250   HGBUR NEGATIVE 05/23/2018 1250   BILIRUBINUR NEGATIVE 05/23/2018 1250   KETONESUR 5 (A) 05/23/2018 1250   PROTEINUR 30 (A) 05/23/2018 1250   NITRITE NEGATIVE 05/23/2018 1250   LEUKOCYTESUR NEGATIVE 05/23/2018 1250   Sepsis Labs: @LABRCNTIP (procalcitonin:4,lacticidven:4)  ) Recent Results (from the past 240 hour(s))  Culture, blood (Routine X 2) w Reflex to ID Panel     Status: None   Collection Time: 05/23/18  6:00 AM  Result Value Ref Range Status   Specimen Description   Final    LEFT ANTECUBITAL BOTTLES DRAWN AEROBIC AND ANAEROBIC   Special Requests Blood Culture adequate volume  Final   Culture   Final    NO GROWTH 5 DAYS Performed at [facility], [address] [place], [place] [address]    Report Status 05/28/2018 FINAL  Final  Culture, blood (Routine X 2) w Reflex to ID Panel     Status: None   Collection Time: 05/23/18  8:45 AM  Result Value Ref Range Status   Specimen Description   Final    RIGHT ANTECUBITAL BOTTLES DRAWN AEROBIC AND ANAEROBIC   Special Requests Blood Culture adequate volume  Final   Culture   Final    NO GROWTH 5 DAYS Performed at [facility], [address] [place], [place] [address]    Report Status 05/28/2018 FINAL  Final  Culture, Urine     Status: Abnormal   Collection Time: 05/23/18 12:50 PM  Result Value Ref Range Status   Specimen Description   Final    URINE, CLEAN CATCH Performed at [facility], [address] [place], [place] [address]    Special Requests   Final    NONE Performed at [facility], [address] [place], [place] [address]    Culture (A)  Final    <10,000 COLONIES/mL INSIGNIFICANT GROWTH Performed at [facility], [address] [place], [place] [address]    Report Status 05/25/2018 FINAL  Final  MRSA PCR Screening     Status: None   Collection Time: 05/23/18  1:00 PM  Result Value Ref Range Status   MRSA by PCR NEGATIVE NEGATIVE Final     Comment:        The GeneXpert MRSA Assay (FDA approved for NASAL specimens only), is one component of a comprehensive MRSA colonization surveillance program. It is not intended to diagnose MRSA infection nor to guide or monitor treatment for MRSA infections. Performed at [facility], [address], [place],  [place] [address]          Radiology Studies: No results found.      Scheduled Meds: . apixaban  5 mg Oral BID  . bisoprolol  2.5 mg Oral Daily  . budesonide (PULMICORT) nebulizer solution  0.5 mg Nebulization BID  . chlorhexidine  15 mL Mouth Rinse BID  . doxycycline  100 mg Oral Q12H  . famotidine  20 mg Oral Daily  . furosemide  40 mg Oral Daily  . ipratropium-albuterol  3 mL Nebulization BID  . mouth rinse  15 mL Mouth Rinse q12n4p  . predniSONE  50 mg Oral Q breakfast  . sodium chloride flush  10-40 mL Intracatheter Q12H  . sodium chloride flush  3 mL Intravenous Q12H   Continuous Infusions: . sodium chloride Stopped (05/29/18 1900)     LOS: 11 days    Time spent: 25 minutes.     Chaya Jan, MD [facility] Pager [phone]  If 7PM-7AM, please contact night-coverage [email or web address] Password [email or web address] 06/01/2018, 3:12 PM

## 2018-06-01 NOTE — Progress Notes (Signed)
Patient s/p chest tube placement 05/27/18 for empyema.  She continues to have drainage daily.  Output yesterday was recorded as 80 mL while set to gravity.  Spoke with RN today.  No air leak.  Continues to do well with chest tube in place.  Continues doxycycline IV.  Per Dr. Philip AspenHernandez Acosta documentation, chest tube to remain until output <10 mL in 24 hrs for 2 consecutive days.  IR to follow.   Loyce DysKacie Matthews, MS RD PA-C 11:24 AM

## 2018-06-02 MED FILL — Medication: Qty: 1 | Status: AC

## 2018-06-02 NOTE — Progress Notes (Signed)
PROGRESS NOTE    [name]  ZOX:[id number] DOB: [birth year] DOA: 05/21/2018 PCP: Benita Stabile, MD     Brief Narrative:  84 year old woman admitted from home on 8/2 with complaints of dyspnea.  She has a history of COPD, combined heart failure, hypertension, paroxysmal atrial fibrillation, sinus node dysfunction, diabetes and coronary artery disease.  She states her shortness of breath has been ongoing for about 1 month but has significantly worsened over the past 2 to 3 days prior to admission.  She visited the emergency department on July 13 and was discharged with prednisone and nebulizers for a presumed COPD exacerbation.  She then had a follow-up with her PCP who diagnosed her with pneumonia and started on antibiotics.  Because of no improvement she was referred to the emergency department for evaluation.  Chest x-ray showed a left pleural effusion, subsequently underwent thoracentesis on 8/2 which removed 300 cc of fluid which was obviously exudative with greater than 1000 WBCs and cultures growing coag negative staph and actinomyces.  Case was discussed with thoracic surgery who did not believe she was an adequate candidate for thoracotomy and has instead recommended IR consultation for placement of pigtail catheter which was performed on 8/8.   Assessment & Plan:   Active Problems:   Essential hypertension, benign   Pleural effusion on left   Atrial fibrillation with RVR (HCC)   Goals of care, counseling/discussion   Chronic respiratory failure with hypoxia and hypercapnia (HCC)   Acute on chronic combined systolic and diastolic CHF (congestive heart failure) (HCC)   Chronic respiratory failure with hypoxia (HCC)   Pressure injury of skin   Acute respiratory failure with hypoxia and hypercarbia (HCC)   Acute metabolic encephalopathy   Lobar pneumonia (HCC)   Pulmonary actinomycosis (HCC)   Empyema, left (HCC)   Acute on chronic respiratory failure with hypoxia and  hypercarbia -Multifactorial in the setting of hospital-acquired pneumonia, exudative pleural effusion, congestive heart failure and COPD. -She uses BiPAP at nighttime, continue. -Significantly improved.  Hospital-acquired pneumonia/exudative pleural effusion -With results of pleural fluid culture, antibiotics have been narrowed to doxycycline which should be sufficient coverage for both coag negative staph and actinomyces. Continue doxy for ONE more day. -Vancomycin discontinued as of 8/7.  Rocephin discontinued on 8/10. -Patient had chest tube placed by interventional radiology on 8/8. -Chest tube drainage: 80 cc past 24 hours -Plan to keep tube in for 2 days after output decreases to less than 20 cc/24 hours. -2 view xray from 8/11 shows improvement in size of pleural effusion.  Acute metabolic encephalopathy -Resolved and at baseline, daughter confirms. -Suspect due to hypercarbic respiratory failure as well as infectious process. -Continue BiPAP at bedtime.  Paroxysmal atrial fibrillation -Currently rate controlled. -Eliquis has been resumed after chest tube placement.  Sinus node dysfunction -Status post permanent pacemaker  Coronary artery disease -Stable, no chest pain -Continue bisoprolol.  Type 2 diabetes -Well-controlled.  Acute on chronic kidney disease stage III -Baseline creatinine is around 1, creatinine peaked at 1.67. -Resolved and down to baseline of 0.82 on 8/7. Cr is 0.76 on 8/10.   DVT prophylaxis: Eliquis Code Status: DNR  Family Communication: patient Disposition Plan: SNF pending medical stability. SW aware.  Consultants:   IR  Procedures:   Chest tube placement on 8/8  Antimicrobials:  Anti-infectives (From admission, onward)   Start     Dose/Rate Route Frequency Ordered Stop   05/29/18 1545  doxycycline (VIBRA-TABS) tablet 100 mg     100  mg Oral Every 12 hours 05/29/18 1530     05/25/18 1500  cefTRIAXone (ROCEPHIN) 2 g in sodium chloride  0.9 % 100 mL IVPB  Status:  Discontinued     2 g 200 mL/hr over 30 Minutes Intravenous Every 24 hours 05/25/18 1345 05/29/18 1530   05/24/18 0900  vancomycin (VANCOCIN) IVPB 750 mg/150 ml premix  Status:  Discontinued     750 mg 150 mL/hr over 60 Minutes Intravenous Every 24 hours 05/23/18 0741 05/26/18 1350   05/24/18 0800  ceFEPIme (MAXIPIME) 1 g in sodium chloride 0.9 % 100 mL IVPB  Status:  Discontinued     1 g 200 mL/hr over 30 Minutes Intravenous Every 24 hours 05/23/18 0740 05/25/18 1345   05/23/18 0830  vancomycin (VANCOCIN) 1,500 mg in sodium chloride 0.9 % 500 mL IVPB     1,500 mg 250 mL/hr over 120 Minutes Intravenous  Once 05/23/18 0737 05/23/18 1212   05/23/18 0830  ceFEPIme (MAXIPIME) 2 g in sodium chloride 0.9 % 100 mL IVPB     2 g 200 mL/hr over 30 Minutes Intravenous  Once 05/23/18 0739 05/23/18 1100     Subjective: Pt has no complaints today.  No pain from chest tube.  No SOB.   Objective: Vitals:   06/02/18 0556 06/02/18 0557 06/02/18 0753 06/02/18 0800  BP:  (!) 106/54    Pulse:  73    Resp:  18    Temp:  97.8 F (36.6 C)    TempSrc:  Oral    SpO2:  99% 98% 99%  Weight: 68.7 kg     Height:        Intake/Output Summary (Last 24 hours) at 06/02/2018 1227 Last data filed at 06/02/2018 0900 Gross per 24 hour  Intake 743 ml  Output 870 ml  Net -127 ml   Filed Weights   05/31/18 0633 06/01/18 0559 06/02/18 0556  Weight: 68.4 kg 68.7 kg 68.7 kg    Examination:  General exam: Alert, awake, oriented x 3 Respiratory system: slightly diminished BS bilateral.  Respiratory effort normal. Chest tube in place.  Cardiovascular system:normal s1,s2 sounds. No murmurs, rubs, gallops. Gastrointestinal system: Abdomen is nondistended, soft and nontender. No organomegaly or masses felt. Normal bowel sounds heard. Central nervous system: Alert and oriented. No focal neurological deficits. Extremities: No C/C/E, +pedal pulses Skin: No rashes, lesions or  ulcers Psychiatry: Judgement and insight appear normal. Mood & affect appropriate.   Data Reviewed: I have personally reviewed following labs and imaging studies  CBC: Recent Labs  Lab 05/27/18 0422 05/28/18 0502 05/29/18 0628 05/31/18 0437  WBC 4.4 5.6 5.8 6.7  HGB 8.0* 8.0* 8.3* 8.5*  HCT 26.4* 26.7* 27.6* 28.3*  MCV 93.3 93.7 93.2 93.1  PLT 158 164 178 213   Basic Metabolic Panel: Recent Labs  Lab 05/29/18 0628 05/31/18 0437  NA 140 140  K 3.4* 4.2  CL 97* 99  CO2 36* 34*  GLUCOSE 94 96  BUN 25* 30*  CREATININE 0.76 0.86  CALCIUM 8.7* 8.9   GFR: Estimated Creatinine Clearance: 45.5 mL/min (by C-G formula based on SCr of 0.86 mg/dL). Liver Function Tests: No results for input(s): AST, ALT, ALKPHOS, BILITOT, PROT, ALBUMIN in the last 168 hours. No results for input(s): LIPASE, AMYLASE in the last 168 hours. No results for input(s): AMMONIA in the last 168 hours. Coagulation Profile: Recent Labs  Lab 05/27/18 0422  INR 1.20   Cardiac Enzymes: No results for input(s): CKTOTAL, CKMB, CKMBINDEX, TROPONINI in  the last 168 hours. BNP (last 3 results) No results for input(s): PROBNP in the last 8760 hours. HbA1C: No results for input(s): HGBA1C in the last 72 hours. CBG: Recent Labs  Lab 05/27/18 1101  GLUCAP 133*   Lipid Profile: No results for input(s): CHOL, HDL, LDLCALC, TRIG, CHOLHDL, LDLDIRECT in the last 72 hours. Thyroid Function Tests: No results for input(s): TSH, T4TOTAL, FREET4, T3FREE, THYROIDAB in the last 72 hours. Anemia Panel: No results for input(s): VITAMINB12, FOLATE, FERRITIN, TIBC, IRON, RETICCTPCT in the last 72 hours. Urine analysis:    Component Value Date/Time   COLORURINE YELLOW 05/23/2018 1250   APPEARANCEUR HAZY (A) 05/23/2018 1250   LABSPEC 1.018 05/23/2018 1250   PHURINE 5.0 05/23/2018 1250   GLUCOSEU NEGATIVE 05/23/2018 1250   HGBUR NEGATIVE 05/23/2018 1250   BILIRUBINUR NEGATIVE 05/23/2018 1250   KETONESUR 5 (A)  05/23/2018 1250   PROTEINUR 30 (A) 05/23/2018 1250   NITRITE NEGATIVE 05/23/2018 1250   LEUKOCYTESUR NEGATIVE 05/23/2018 1250    Recent Results (from the past 240 hour(s))  Culture, Urine     Status: Abnormal   Collection Time: 05/23/18 12:50 PM  Result Value Ref Range Status   Specimen Description   Final    URINE, CLEAN CATCH Performed at [facility], [address] [place], [place] [address]    Special Requests   Final    NONE Performed at [facility], [address] [place], [place] [address]    Culture (A)  Final    <10,000 COLONIES/mL INSIGNIFICANT GROWTH Performed at [facility], [address] [place], [place] [address]    Report Status 05/25/2018 FINAL  Final  MRSA PCR Screening     Status: None   Collection Time: 05/23/18  1:00 PM  Result Value Ref Range Status   MRSA by PCR NEGATIVE NEGATIVE Final    Comment:        The GeneXpert MRSA Assay (FDA approved for NASAL specimens only), is one component of a comprehensive MRSA colonization surveillance program. It is not intended to diagnose MRSA infection nor to guide or monitor treatment for MRSA infections. Performed at [facility], [address] [place], [place] [address]     Radiology Studies: No results found.  Scheduled Meds: . apixaban  5 mg Oral BID  . bisoprolol  2.5 mg Oral Daily  . budesonide (PULMICORT) nebulizer solution  0.5 mg Nebulization BID  . chlorhexidine  15 mL Mouth Rinse BID  . doxycycline  100 mg Oral Q12H  . famotidine  20 mg Oral Daily  . furosemide  40 mg Oral Daily  . ipratropium-albuterol  3 mL Nebulization BID  . mouth rinse  15 mL Mouth Rinse q12n4p  . predniSONE  50 mg Oral Q breakfast  . sodium chloride flush  10-40 mL Intracatheter Q12H  . sodium chloride flush  3 mL Intravenous Q12H   Continuous Infusions: . sodium chloride Stopped (05/29/18 1900)     LOS: 12 days   Time spent: 22 minutes.   Standley Dakinslanford Johnson, MD [facility] Pager  [phone]  If 7PM-7AM, please contact night-coverage [email or web address] Password [email or web address] 06/02/2018, 12:27 PM

## 2018-06-02 NOTE — Care Management Important Message (Signed)
Important Message  Patient Details  Name: [name] MRN: [id number] Date of Birth: 02/04/1931   Medicare Important Message Given:  Yes    [name], [name] 06/02/2018, 11:26 AM

## 2018-06-02 NOTE — Clinical Social Work Note (Signed)
Clinical Social Work Assessment  Patient Details  Name: Simone CuriaVirginia L Kyer MRN: [id number] Date of Birth: 05/13/1931  Date of referral:  06/02/18               Reason for consult:  Facility Placement                Permission sought to share information with:    Permission granted to share information::     Name::        Agency::     Relationship::     Contact Information:     Housing/Transportation Living arrangements for the past 2 months:  Single Family Home Source of Information:  Patient Patient Interpreter Needed:  None Criminal Activity/Legal Involvement Pertinent to Current Situation/Hospitalization:  No - Comment as needed Significant Relationships:  Adult Children Lives with:  Self Do you feel safe going back to the place where you live?  Yes Need for family participation in patient care:  Yes (Comment)  Care giving concerns:  Patient is independent at baseline.   Social Worker assessment / plan:  Patient [facility] Patient ambulates with a cane in the home.   Employment status:  Part-Time Health and safety inspectornsurance information:  Medicare PT Recommendations:  Skilled Nursing Facility Information / Referral to community resources:  Skilled Nursing Facility  Patient/Family's Response to care:  Patient is agree[facility]   Patient/Family's Understanding of and Emotional Response to Diagnosis, Current Treatment, and Prognosis:  Patient understands her diagnosis, treatment and prognosis and feel SNF is appropriate at this time.   Emotional Assessment Appearance:  Appears stated age Attitude/Demeanor/Rapport:    Affect (typically observed):  Accepting, Calm Orientation:  Oriented to Self, Oriented to Place, Oriented to  Time, Oriented to Situation Alcohol / Substance use:  Not Applicable Psych involvement (Current and /or in the community):  No (Comment)  Discharge Needs  Concerns to be addressed:  Discharge Planning  Concerns Readmission within the last 30 days:  No Current discharge risk:  Lives alone Barriers to Discharge:  No Barriers Identified   Annice NeedySettle, Briannah Lona D, LCSW 06/02/2018, 9:29 AM

## 2018-06-02 NOTE — [place] FL2 (Signed)
[place] MEDICAID FL2 LEVEL OF CARE SCREENING TOOL     IDENTIFICATION  Patient Name: [name] Birthdate: 10/09/1931 Sex: female Admission Date (Current Location): 05/21/2018  [facility] [place]   Facility and Address:  [facility] [place], [address], [place], [place] [address]      Provider Number: [id number]  Attending Physician Name and Address:  Cleora FleetJohnson, Clanford L, MD  Relative Name and Phone Number:       Current Level of Care: Hospital Recommended Level of Care: Skilled Nursing Facility Prior Approval Number:    Date Approved/Denied:   PASRR Number: [id number]  Discharge Plan: SNF    Current Diagnoses: Patient Active Problem List   Diagnosis Date Noted  . Pulmonary actinomycosis (HCC) 05/25/2018  . Empyema, left (HCC)   . Pressure injury of skin 05/23/2018  . Acute respiratory failure with hypoxia and hypercarbia (HCC) 05/23/2018  . Acute metabolic encephalopathy 05/23/2018  . Lobar pneumonia (HCC) 05/23/2018  . Acute on chronic combined systolic and diastolic CHF (congestive heart failure) (HCC) 05/21/2018  . Chronic respiratory failure with hypoxia (HCC) 05/21/2018  . UTI (urinary tract infection) 06/25/2016  . Symptomatic bradycardia 06/21/2016  . Digoxin toxicity 06/21/2016  . Chronic respiratory failure with hypoxia and hypercapnia (HCC) 06/13/2016  . Diarrhea 04/07/2016  . Anemia 04/07/2016  . Localized edema 04/07/2016  . Combined congestive systolic and diastolic heart failure (HCC) 03/20/2016  . Palliative care encounter   . Goals of care, counseling/discussion   . Dyspnea   . Atrial fibrillation with RVR (HCC)   . Pleural effusion on left 03/09/2016  . Atrial fibrillation (HCC) 03/09/2016  . Hyperlipidemia   . Type 2 diabetes mellitus (HCC) 12/26/2009  . HYPERLIPIDEMIA 12/26/2009  . Essential hypertension, benign 12/26/2009  . Coronary atherosclerosis of native coronary artery 12/26/2009  .  COPD (chronic obstructive pulmonary disease) (HCC) 12/26/2009    Orientation RESPIRATION BLADDER Height & Weight     Self, Time, Situation, Place  Other (Comment), O2([place] 2L; bipap at night) Incontinent Weight: 151 lb 7.3 oz (68.7 kg) Height:  5\' 4"  (162.6 cm)  BEHAVIORAL SYMPTOMS/MOOD NEUROLOGICAL BOWEL NUTRITION STATUS      Continent Diet(heart healthy)  AMBULATORY STATUS COMMUNICATION OF NEEDS Skin   Limited Assist Verbally Other (Comment)(Stage I, Sacrum, Medial; patient also has a chest tube) PU Stage 1 Dressing: (sacrum, foam dressing changed PRN)                     Personal Care Assistance Level of Assistance  Bathing, Feeding, Dressing Bathing Assistance: Limited assistance Feeding assistance: Independent Dressing Assistance: Limited assistance     Functional Limitations Info  Sight, Hearing, Speech Sight Info: Impaired(wears glasses) Hearing Info: Impaired(HOH) Speech Info: Adequate    SPECIAL CARE FACTORS FREQUENCY  PT (By licensed PT), OT (By licensed OT)     PT Frequency: 5x/wk OT Frequency: 3x/week            Contractures Contractures Info: Not present    Additional Factors Info  Psychotropic Code Status Info: DNR Allergies Info: Codeine, Heparin Psychotropic Info: Ativan         Current Medications (06/02/2018):  This is the current hospital active medication list Current Facility-Administered Medications  Medication Dose Route Frequency Provider Last Rate Last Dose  . 0.9 %  sodium chloride infusion  250 mL Intravenous PRN Philip AspenHernandez Acosta, Limmie PatriciaEstela Y, MD   Stopped at 05/29/18 1900  . acetaminophen (TYLENOL) tablet 650 mg  650 mg Oral Q4H PRN Philip AspenHernandez Acosta, Limmie PatriciaEstela Y, MD   650 mg at 05/22/18 0743  . apixaban (ELIQUIS) tablet 5 mg  5 mg Oral BID Philip AspenHernandez Acosta, Limmie PatriciaEstela Y, MD   5 mg at 06/02/18 [phone]  . bisoprolol (ZEBETA) tablet 2.5 mg  2.5 mg Oral Daily Philip AspenHernandez Acosta, Limmie PatriciaEstela Y, MD   2.5 mg at 06/02/18 [phone]  . budesonide (PULMICORT)  nebulizer solution 0.5 mg  0.5 mg Nebulization BID Philip AspenHernandez Acosta, Limmie PatriciaEstela Y, MD   0.5 mg at 06/02/18 0759  . chlorhexidine (PERIDEX) 0.12 % solution 15 mL  15 mL Mouth Rinse BID Philip AspenHernandez Acosta, Limmie PatriciaEstela Y, MD   15 mL at 06/02/18 0839  . doxycycline (VIBRA-TABS) tablet 100 mg  100 mg Oral Q12H Philip AspenHernandez Acosta, Limmie PatriciaEstela Y, MD   100 mg at 06/02/18 0837  . famotidine (PEPCID) tablet 20 mg  20 mg Oral Daily Philip AspenHernandez Acosta, Limmie PatriciaEstela Y, MD   20 mg at 06/02/18 0837  . furosemide (LASIX) tablet 40 mg  40 mg Oral Daily Philip AspenHernandez Acosta, Limmie PatriciaEstela Y, MD   40 mg at 06/02/18 [phone]  . ipratropium-albuterol (DUONEB) 0.5-2.5 (3) MG/3ML nebulizer solution 3 mL  3 mL Nebulization Q4H PRN Philip AspenHernandez Acosta, Limmie PatriciaEstela Y, MD      . ipratropium-albuterol (DUONEB) 0.5-2.5 (3) MG/3ML nebulizer solution 3 mL  3 mL Nebulization BID Philip AspenHernandez Acosta, Limmie PatriciaEstela Y, MD   3 mL at 06/02/18 0751  . LORazepam (ATIVAN) injection 0.5 mg  0.5 mg Intravenous QHS PRN Philip AspenHernandez Acosta, Limmie PatriciaEstela Y, MD   0.5 mg at 06/01/18 2215  . MEDLINE mouth rinse  15 mL Mouth Rinse q12n4p Philip AspenHernandez Acosta, Limmie PatriciaEstela Y, MD   15 mL at 06/01/18 1809  . metoprolol tartrate (LOPRESSOR) injection 2.5 mg  2.5 mg Intravenous Q6H PRN Philip AspenHernandez Acosta, Limmie PatriciaEstela Y, MD   2.5 mg at 05/24/18 2152  . ondansetron (ZOFRAN) injection 4 mg  4 mg Intravenous Q6H PRN Philip AspenHernandez Acosta, Limmie PatriciaEstela Y, MD   4 mg at 05/22/18 1401  . predniSONE (DELTASONE) tablet 50 mg  50 mg Oral Q breakfast Philip AspenHernandez Acosta, Limmie PatriciaEstela Y, MD   50 mg at 06/02/18 0836  . sodium chloride flush (NS) 0.9 % injection 10-40 mL  10-40 mL Intracatheter Q12H Philip AspenHernandez Acosta, Limmie PatriciaEstela Y, MD   10 mL at 06/02/18 0840  . sodium chloride flush (NS) 0.9 % injection 10-40 mL  10-40 mL Intracatheter PRN Philip AspenHernandez Acosta, Limmie PatriciaEstela Y, MD   10 mL at 06/01/18 1748  . sodium chloride flush (NS) 0.9 % injection 3 mL  3 mL Intravenous Q12H Philip AspenHernandez Acosta, Limmie PatriciaEstela Y, MD   3 mL at 06/02/18 0839  . sodium chloride flush (NS) 0.9 % injection 3 mL  3  mL Intravenous PRN Philip AspenHernandez Acosta, Limmie PatriciaEstela Y, MD         Discharge Medications: Please see discharge summary for a list of discharge medications.  Relevant Imaging Results:  Relevant Lab Results:   Additional Information SS#: [id number]  Annice NeedySettle, Ilma Achee D, LCSW

## 2018-06-03 NOTE — Progress Notes (Signed)
Up with therapist today and ambulated short distance in hallway and now sitting up in chair.  Felt light-headed and nauseated during walk and given zofran.  Denied SOB

## 2018-06-03 NOTE — Progress Notes (Signed)
Patient s/p chest tube placement 05/27/18 for empyema.  She continues to have 20-80 mL drainage daily.  Output yesterday was recorded as 80 mL while set to gravity.  Spoke with RN today.  No air leak.  Continues to do well with chest tube in place.  Continues doxycycline IV for at least 2 more days. Plan was for tube to remain until output trended down, however has been stable.  RN says patient has not been up and OOB.  Encourage ambulation to see if any affect on output. Will likely need imaging (CXR or otherwise) prior to chest tube removal to assess.  Will discuss with Dr. Fredia SorrowYamagata.  IR to follow.   Loyce DysKacie Evadene Wardrip, MS RD PA-C 8:48 AM

## 2018-06-03 NOTE — Progress Notes (Signed)
PROGRESS NOTE    Kathy CuriaVirginia L Page  ZOX:[id number] DOB: 12/10/1930 DOA: 05/21/2018 PCP: Benita StabileHall, John Z, MD   Brief Narrative:  84 year old woman admitted from home on 8/2 with complaints of dyspnea.  She has a history of COPD, combined heart failure, hypertension, paroxysmal atrial fibrillation, sinus node dysfunction, diabetes and coronary artery disease.  She states her shortness of breath has been ongoing for about 1 month but has significantly worsened over the past 2 to 3 days prior to admission.  She visited the emergency department on July 13 and was discharged with prednisone and nebulizers for a presumed COPD exacerbation.  She then had a follow-up with her PCP who diagnosed her with pneumonia and started on antibiotics.  Because of no improvement she was referred to the emergency department for evaluation.  Chest x-ray showed a left pleural effusion, subsequently underwent thoracentesis on 8/2 which removed 300 cc of fluid which was obviously exudative with greater than 1000 WBCs and cultures growing coag negative staph and actinomyces.  Case was discussed with thoracic surgery who did not believe she was an adequate candidate for thoracotomy and has instead recommended IR consultation for placement of pigtail catheter which was performed on 8/8.  Assessment & Plan:   Active Problems:   Essential hypertension, benign   Pleural effusion on left   Atrial fibrillation with RVR (HCC)   Goals of care, counseling/discussion   Chronic respiratory failure with hypoxia and hypercapnia (HCC)   Acute on chronic combined systolic and diastolic CHF (congestive heart failure) (HCC)   Chronic respiratory failure with hypoxia (HCC)   Pressure injury of skin   Acute respiratory failure with hypoxia and hypercarbia (HCC)   Acute metabolic encephalopathy   Lobar pneumonia (HCC)   Pulmonary actinomycosis (HCC)   Empyema, left (HCC)  Acute on chronic respiratory failure with hypoxia and  hypercarbia -Multifactorial in the setting of hospital-acquired pneumonia, exudative pleural effusion, congestive heart failure and COPD. -She uses BiPAP at nighttime, continue. -Significantly improved.  Hospital-acquired pneumonia/exudative pleural effusion -With results of pleural fluid culture, antibiotics have been narrowed to doxycycline which should be sufficient coverage for both coag negative staph and actinomyces. Discontinue doxycycline 06/03/18. -Vancomycin discontinued as of 8/7.  Rocephin discontinued on 8/10. -Patient had chest tube placed by interventional radiology on 8/8. -Chest tube drainage: remains 80 cc past 24 hours -Plan from IR team is to keep tube in for 2 days after output decreases to less than 20 cc/24 hours. -2 view xray from 8/11 shows improvement in size of pleural effusion.  Acute metabolic encephalopathy -Resolved and at baseline, daughter confirms. -Suspect due to hypercarbic respiratory failure as well as infectious process. -Continue BiPAP at bedtime.  Paroxysmal atrial fibrillation -Currently rate controlled. -Eliquis has been resumed after chest tube placement.  Monitoring for bleeding.    Sinus node dysfunction -Status post permanent pacemaker  Coronary artery disease -Stable, no chest pain -Continue bisoprolol.  Type 2 diabetes -stable and well controlled.   Acute on chronic kidney disease stage III -Baseline creatinine is around 1, creatinine peaked at 1.67. -Resolved and down to baseline of 0.82 on 8/7. Cr is 0.76 on 8/10.  DVT prophylaxis: Eliquis Code Status: DNR  Family Communication: patient Disposition Plan: SNF pending medical stability. SW aware.  Will ask care team about LTAC consideration.    Consultants:   IR  Procedures:   Chest tube placement on 8/8  Antimicrobials:  Anti-infectives (From admission, onward)   Start     Dose/Rate Route Frequency  Ordered Stop   05/29/18 1545  doxycycline (VIBRA-TABS) tablet 100 mg      100 mg Oral Every 12 hours 05/29/18 1530 06/03/18 1229   05/25/18 1500  cefTRIAXone (ROCEPHIN) 2 g in sodium chloride 0.9 % 100 mL IVPB  Status:  Discontinued     2 g 200 mL/hr over 30 Minutes Intravenous Every 24 hours 05/25/18 1345 05/29/18 1530   05/24/18 0900  vancomycin (VANCOCIN) IVPB 750 mg/150 ml premix  Status:  Discontinued     750 mg 150 mL/hr over 60 Minutes Intravenous Every 24 hours 05/23/18 0741 05/26/18 1350   05/24/18 0800  ceFEPIme (MAXIPIME) 1 g in sodium chloride 0.9 % 100 mL IVPB  Status:  Discontinued     1 g 200 mL/hr over 30 Minutes Intravenous Every 24 hours 05/23/18 0740 05/25/18 1345   05/23/18 0830  vancomycin (VANCOCIN) 1,500 mg in sodium chloride 0.9 % 500 mL IVPB     1,500 mg 250 mL/hr over 120 Minutes Intravenous  Once 05/23/18 0737 05/23/18 1212   05/23/18 0830  ceFEPIme (MAXIPIME) 2 g in sodium chloride 0.9 % 100 mL IVPB     2 g 200 mL/hr over 30 Minutes Intravenous  Once 05/23/18 0739 05/23/18 1100     Subjective: Pt has no complaints today.   No pain from chest tube.  No SOB. Pt says that she is good today.   She really wants to have chest tube removed.    Objective: Vitals:   06/03/18 0537 06/03/18 0541 06/03/18 0740 06/03/18 0747  BP: 108/63     Pulse: 98     Resp: 20     Temp: 97.7 F (36.5 C)     TempSrc: Axillary     SpO2: 98%  99% 100%  Weight:  69.9 kg    Height:        Intake/Output Summary (Last 24 hours) at 06/03/2018 0748 Last data filed at 06/03/2018 0700 Gross per 24 hour  Intake 600 ml  Output 780 ml  Net -180 ml   Filed Weights   06/01/18 0559 06/02/18 0556 06/03/18 0541  Weight: 68.7 kg 68.7 kg 69.9 kg   Examination:  General exam: Alert, awake, oriented x 3 Respiratory system: slightly diminished BS bilateral.  Respiratory effort normal. Chest tube in place.  Cardiovascular system:normal s1,s2 sounds. No murmurs, rubs, gallops. Gastrointestinal system: Abdomen is nondistended, soft and nontender. No organomegaly  or masses felt. Normal bowel sounds heard. Central nervous system: Alert and oriented. No focal neurological deficits. Extremities: No C/C/E, +pedal pulses Skin: No rashes, lesions or ulcers Psychiatry: Judgement and insight appear normal. Mood & affect appropriate.   Data Reviewed: I have personally reviewed following labs and imaging studies  CBC: Recent Labs  Lab 05/28/18 0502 05/29/18 0628 05/31/18 0437  WBC 5.6 5.8 6.7  HGB 8.0* 8.3* 8.5*  HCT 26.7* 27.6* 28.3*  MCV 93.7 93.2 93.1  PLT 164 178 213   Basic Metabolic Panel: Recent Labs  Lab 05/29/18 0628 05/31/18 0437  NA 140 140  K 3.4* 4.2  CL 97* 99  CO2 36* 34*  GLUCOSE 94 96  BUN 25* 30*  CREATININE 0.76 0.86  CALCIUM 8.7* 8.9   GFR: Estimated Creatinine Clearance: 45.9 mL/min (by C-G formula based on SCr of 0.86 mg/dL). Liver Function Tests: No results for input(s): AST, ALT, ALKPHOS, BILITOT, PROT, ALBUMIN in the last 168 hours. No results for input(s): LIPASE, AMYLASE in the last 168 hours. No results for input(s): AMMONIA in the  last 168 hours. Coagulation Profile: No results for input(s): INR, PROTIME in the last 168 hours. Cardiac Enzymes: No results for input(s): CKTOTAL, CKMB, CKMBINDEX, TROPONINI in the last 168 hours. BNP (last 3 results) No results for input(s): PROBNP in the last 8760 hours. HbA1C: No results for input(s): HGBA1C in the last 72 hours. CBG: Recent Labs  Lab 05/27/18 1101  GLUCAP 133*   Lipid Profile: No results for input(s): CHOL, HDL, LDLCALC, TRIG, CHOLHDL, LDLDIRECT in the last 72 hours. Thyroid Function Tests: No results for input(s): TSH, T4TOTAL, FREET4, T3FREE, THYROIDAB in the last 72 hours. Anemia Panel: No results for input(s): VITAMINB12, FOLATE, FERRITIN, TIBC, IRON, RETICCTPCT in the last 72 hours. Urine analysis:    Component Value Date/Time   COLORURINE YELLOW 05/23/2018 1250   APPEARANCEUR HAZY (A) 05/23/2018 1250   LABSPEC 1.018 05/23/2018 1250    PHURINE 5.0 05/23/2018 1250   GLUCOSEU NEGATIVE 05/23/2018 1250   HGBUR NEGATIVE 05/23/2018 1250   BILIRUBINUR NEGATIVE 05/23/2018 1250   KETONESUR 5 (A) 05/23/2018 1250   PROTEINUR 30 (A) 05/23/2018 1250   NITRITE NEGATIVE 05/23/2018 1250   LEUKOCYTESUR NEGATIVE 05/23/2018 1250    No results found for this or any previous visit (from the past 240 hour(s)).  Radiology Studies: No results found.  Scheduled Meds: . apixaban  5 mg Oral BID  . bisoprolol  2.5 mg Oral Daily  . budesonide (PULMICORT) nebulizer solution  0.5 mg Nebulization BID  . chlorhexidine  15 mL Mouth Rinse BID  . doxycycline  100 mg Oral Q12H  . famotidine  20 mg Oral Daily  . furosemide  40 mg Oral Daily  . ipratropium-albuterol  3 mL Nebulization BID  . mouth rinse  15 mL Mouth Rinse q12n4p  . predniSONE  50 mg Oral Q breakfast  . sodium chloride flush  10-40 mL Intracatheter Q12H  . sodium chloride flush  3 mL Intravenous Q12H   Continuous Infusions: . sodium chloride Stopped (05/29/18 1900)     LOS: 13 days   Time spent: 21 minutes.   Standley Dakinslanford Demarrio Menges, MD [facility] Pager [phone]  If 7PM-7AM, please contact night-coverage [email or web address] Password [email or web address] 06/03/2018, 7:48 AM

## 2018-06-03 NOTE — Progress Notes (Signed)
Physical Therapy Treatment Patient Details Name: [name] MRN: [id number] DOB: 07/21/1931 Today's Date: 06/03/2018    History of Present Illness [name] is a 84 y.o. female with medical history of COPD, systolic and diastolic CHF, hypertension, paroxysmal atrial for ablation, sinus node dysfunction, diabetes mellitus, coronary artery disease presenting with 1 month history of shortness of breath that has significantly worsened over the past 2 days.  The patient visited the emergency department on 05/01/2018.  She was discharged from the emergency department with prednisone for COPD exacerbation.  There was no improvement with prednisone.  She will follow-up with her primary care provider.  She was given a diagnosis of pneumonia and started on antibiotics which she has completed at least 1 week prior to this admission.  Unfortunately, the patient shortness of breath continued to worsen.  Primarily, her shortness of breath is with exertion, but it has worsened to the point where she is having shortness of breath at rest.  She denies any fevers, chills, chest pain, nausea, vomiting, diarrhea, abdominal pain, dysuria, hematuria.  She has a nonproductive cough.  The patient endorses compliance with all her medications.  The patient states that she has had some increase in her lower extremity edema as well as increasing abdominal girth.  She also notes some PND type symptoms at nighttime.  In the emergency department, the patient was afebrile hemodynamically stable saturating 100% on 2.5 L.  BNP was 787.  Potassium is 5.3.  Chest x-ray showed a left pleural effusion which is slightly worse.  The patient was given furosemide 40 mg IV x1.  CBC was essentially unremarkable with hemoglobin near baseline.  EKG showed atrial fibrillation with right bundle branch block    PT Comments    Patient demonstrates increased out of bed activity, endurance/distance for gait training with slow labored cadence  without loss of balance, mostly limited due to c/o mild dizziness/nausea and tolerated sitting up in chair after therapy - RN notified.  Patient will benefit from continued physical therapy in hospital and recommended venue below to increase strength, balance, endurance for safe ADLs and gait.   Follow Up Recommendations  SNF;Supervision/Assistance - 24 hour     Equipment Recommendations  None recommended by PT    Recommendations for Other Services       Precautions / Restrictions Precautions Precautions: Fall Precaution Comments: chest tube in place Restrictions Weight Bearing Restrictions: No    Mobility  Bed Mobility Overal bed mobility: Modified Independent             General bed mobility comments: increased time  Transfers Overall transfer level: Needs assistance Equipment used: Rolling walker (2 wheeled) Transfers: Sit to/from UGI CorporationStand;Stand Pivot Transfers Sit to Stand: Min guard Stand pivot transfers: Min guard       General transfer comment: slow labored movement  Ambulation/Gait Ambulation/Gait assistance: Min assist Gait Distance (Feet): 35 Feet Assistive device: Rolling walker (2 wheeled) Gait Pattern/deviations: Decreased step length - right;Decreased step length - left;Decreased stride length Gait velocity: decreased   General Gait Details: demonstrates slow labored cadence without loss of balance, limited secondary to c/o fatigue, mild dizziness/nausea   Stairs             Wheelchair Mobility    Modified Rankin (Stroke Patients Only)       Balance Overall balance assessment: Needs assistance Sitting-balance support: Feet supported;No upper extremity supported Sitting balance-Leahy Scale: Good     Standing balance support: Bilateral upper extremity supported;During functional activity  Standing balance-Leahy Scale: Fair                              Cognition Arousal/Alertness: Awake/alert Behavior During Therapy: WFL  for tasks assessed/performed Overall Cognitive Status: Within Functional Limits for tasks assessed                                        Exercises General Exercises - Lower Extremity Long Arc Quad: Seated;AROM;Strengthening;Both;10 reps Hip Flexion/Marching: Seated;AROM;Strengthening;Both;10 reps Toe Raises: Seated;AROM;Strengthening;Both;10 reps Heel Raises: Seated;AROM;Strengthening;Both;10 reps    General Comments        Pertinent Vitals/Pain Pain Assessment: No/denies pain    Home Living                      Prior Function            PT Goals (current goals can now be found in the care plan section) Acute Rehab PT Goals Patient Stated Goal: To be stronger and be able to walk again.   PT Goal Formulation: With patient Time For Goal Achievement: 06/11/18 Potential to Achieve Goals: Good Progress towards PT goals: Progressing toward goals    Frequency    Min 3X/week      PT Plan Current plan remains appropriate    Co-evaluation              AM-PAC PT "6 Clicks" Daily Activity  Outcome Measure  Difficulty turning over in bed (including adjusting bedclothes, sheets and blankets)?: None Difficulty moving from lying on back to sitting on the side of the bed? : None Difficulty sitting down on and standing up from a chair with arms (e.g., wheelchair, bedside commode, etc,.)?: A Little Help needed moving to and from a bed to chair (including a wheelchair)?: A Little Help needed walking in hospital room?: A Little Help needed climbing 3-5 steps with a railing? : A Lot 6 Click Score: 19    End of Session Equipment Utilized During Treatment: Gait belt Activity Tolerance: Patient tolerated treatment well;Patient limited by fatigue(Patient limited by mild nausea/dizziness) Patient left: in chair;with call bell/phone within reach Nurse Communication: Mobility status;Other (comment)(nursing staff notified that patient left up in  chair) PT Visit Diagnosis: Unsteadiness on feet (R26.81);Other abnormalities of gait and mobility (R26.89);Muscle weakness (generalized) (M62.81)     Time: [id number] PT Time Calculation (min) (ACUTE ONLY): 24 min  Charges:  $Therapeutic Exercise: 8-22 mins $Therapeutic Activity: 8-22 mins                     1:53 PM, 06/03/18 Ocie BobJames Donnell Wion, MPT Physical Therapist with [facility] [phone] office [phone] mobile phone

## 2018-06-04 ENCOUNTER — Inpatient Hospital Stay (HOSPITAL_COMMUNITY): Payer: Medicare Other

## 2018-06-04 MED ORDER — MAGNESIUM HYDROXIDE 400 MG/5ML PO SUSP
30.0000 mL | Freq: Every day | ORAL | Status: DC | PRN
  Administered 2018-06-04: 30 mL via ORAL
  Filled 2018-06-04: qty 30

## 2018-06-04 NOTE — Progress Notes (Addendum)
Patient ID: Kathy CuriaVirginia L Gift, female   DOB: 10/22/1930, 84 y.o.   MRN: [id number]  Left empyema with residual effusion Left chest tube drain placed 05/27/18  OP over 80 cc daily per RN afeb wbc wnl  100 cc OP since 7pm last night No air leak per RN Site is clean and dry  Discussed with Dr Lowella DandyHenn Consider Pulmonary Consult or Surgical consult for evaluation and management  CXR ordered

## 2018-06-04 NOTE — Care Management (Signed)
Attending has discussed LTACH option with patient. Patient is interested in discussing with [facility]. Irving Burtonmily rep of [facility] will meet with patient around 1 pm today.

## 2018-06-04 NOTE — Progress Notes (Signed)
Physical Therapy Treatment Patient Details Name: Simone CuriaVirginia L Tristan MRN: [id number] DOB: 02/14/1931 Today's Date: 06/04/2018    History of Present Illness Simone CuriaVirginia L Duke is a 84 y.o. female with medical history of COPD, systolic and diastolic CHF, hypertension, paroxysmal atrial for ablation, sinus node dysfunction, diabetes mellitus, coronary artery disease presenting with 1 month history of shortness of breath that has significantly worsened over the past 2 days.  The patient visited the emergency department on 05/01/2018.  She was discharged from the emergency department with prednisone for COPD exacerbation.  There was no improvement with prednisone.  She will follow-up with her primary care provider.  She was given a diagnosis of pneumonia and started on antibiotics which she has completed at least 1 week prior to this admission.  Unfortunately, the patient shortness of breath continued to worsen.  Primarily, her shortness of breath is with exertion, but it has worsened to the point where she is having shortness of breath at rest.  She denies any fevers, chills, chest pain, nausea, vomiting, diarrhea, abdominal pain, dysuria, hematuria.  She has a nonproductive cough.  The patient endorses compliance with all her medications.  The patient states that she has had some increase in her lower extremity edema as well as increasing abdominal girth.  She also notes some PND type symptoms at nighttime.  In the emergency department, the patient was afebrile hemodynamically stable saturating 100% on 2.5 L.  BNP was 787.  Potassium is 5.3.  Chest x-ray showed a left pleural effusion which is slightly worse.  The patient was given furosemide 40 mg IV x1.  CBC was essentially unremarkable with hemoglobin near baseline.  EKG showed atrial fibrillation with right bundle branch block    PT Comments    Patient c/o mild dizziness after sitting up at bedside without use of side rail, demonstrates good return for  completing exercises without c/o pain, limited for ambulation in hallway mostly due to c/o fatigue and mild dizziness.  Patient tolerated sitting up in chair with visitor present after therapy - RN notified.  Patient will benefit from continued physical therapy in hospital and recommended venue below to increase strength, balance, endurance for safe ADLs and gait.    Follow Up Recommendations  SNF;Supervision/Assistance - 24 hour     Equipment Recommendations  None recommended by PT    Recommendations for Other Services       Precautions / Restrictions Precautions Precautions: Fall Precaution Comments: chest tube in place Restrictions Weight Bearing Restrictions: No    Mobility  Bed Mobility Overal bed mobility: Modified Independent             General bed mobility comments: had to use siderail  Transfers Overall transfer level: Needs assistance Equipment used: Rolling walker (2 wheeled) Transfers: Sit to/from [employer] CorporationStand;Stand Pivot Transfers Sit to Stand: Min guard Stand pivot transfers: Min guard       General transfer comment: slow labored movement  Ambulation/Gait Ambulation/Gait assistance: Min assist Gait Distance (Feet): 30 Feet Assistive device: Rolling walker (2 wheeled) Gait Pattern/deviations: Decreased step length - right;Decreased step length - left;Decreased stride length Gait velocity: decreased   General Gait Details: demonstrates slow labored cadence without loss of balance, limited secondary to c/o fatigue, mild dizziness   Stairs             Wheelchair Mobility    Modified Rankin (Stroke Patients Only)       Balance Overall balance assessment: Needs assistance Sitting-balance support: Feet supported;No upper extremity supported  Sitting balance-Leahy Scale: Good     Standing balance support: Bilateral upper extremity supported;During functional activity Standing balance-Leahy Scale: Fair                               Cognition Arousal/Alertness: Awake/alert Behavior During Therapy: WFL for tasks assessed/performed Overall Cognitive Status: Within Functional Limits for tasks assessed                                        Exercises General Exercises - Lower Extremity Long Arc Quad: Seated;AROM;Strengthening;Both;10 reps Hip Flexion/Marching: Seated;AROM;Strengthening;Both;10 reps Toe Raises: Seated;AROM;Strengthening;Both;10 reps Heel Raises: Seated;AROM;Strengthening;Both;10 reps    General Comments        Pertinent Vitals/Pain Pain Assessment: No/denies pain    Home Living                      Prior Function            PT Goals (current goals can now be found in the care plan section) Acute Rehab PT Goals Patient Stated Goal: To be stronger and be able to walk again.   PT Goal Formulation: With patient Time For Goal Achievement: 06/11/18 Progress towards PT goals: Progressing toward goals    Frequency    Min 3X/week      PT Plan Current plan remains appropriate    Co-evaluation              AM-PAC PT "6 Clicks" Daily Activity  Outcome Measure  Difficulty turning over in bed (including adjusting bedclothes, sheets and blankets)?: None Difficulty moving from lying on back to sitting on the side of the bed? : None Difficulty sitting down on and standing up from a chair with arms (e.g., wheelchair, bedside commode, etc,.)?: A Little Help needed moving to and from a bed to chair (including a wheelchair)?: A Little Help needed walking in hospital room?: A Little Help needed climbing 3-5 steps with a railing? : A Lot 6 Click Score: 19    End of Session Equipment Utilized During Treatment: Gait belt Activity Tolerance: Patient tolerated treatment well;Patient limited by fatigue Patient left: in chair;with call bell/phone within reach Nurse Communication: Mobility status;Other (comment)(RN notified that patient left up in chair) PT Visit  Diagnosis: Unsteadiness on feet (R26.81);Other abnormalities of gait and mobility (R26.89);Muscle weakness (generalized) (M62.81)     Time: [id number] PT Time Calculation (min) (ACUTE ONLY): 27 min  Charges:  $Therapeutic Exercise: 8-22 mins $Therapeutic Activity: 8-22 mins                     12:21 PM, 06/04/18 Ocie BobJames Kayhan Boardley, MPT Physical Therapist [phone] office [phone] mobile phone

## 2018-06-04 NOTE — Care Management (Signed)
Patient and family members (not daughter) have elected to not pursue LTACH. [facility] to [facility] since it is closer for family to visit patient.

## 2018-06-04 NOTE — Progress Notes (Signed)
PROGRESS NOTE    [name]  RUE:[id number] DOB: 04/16/1931 DOA: 05/21/2018 PCP: Benita StabileHall, John Z, MD   Brief Narrative:  84 year old woman admitted from home on 8/2 with complaints of dyspnea.  She has a history of COPD, combined heart failure, hypertension, paroxysmal atrial fibrillation, sinus node dysfunction, diabetes and coronary artery disease.  She states her shortness of breath has been ongoing for about 1 month but has significantly worsened over the past 2 to 3 days prior to admission.  She visited the emergency department on July 13 and was discharged with prednisone and nebulizers for a presumed COPD exacerbation.  She then had a follow-up with her PCP who diagnosed her with pneumonia and started on antibiotics.  Because of no improvement she was referred to the emergency department for evaluation.  Chest x-ray showed a left pleural effusion, subsequently underwent thoracentesis on 8/2 which removed 300 cc of fluid which was obviously exudative with greater than 1000 WBCs and cultures growing coag negative staph and actinomyces.  Case was discussed with thoracic surgery who did not believe she was an adequate candidate for thoracotomy and has instead recommended IR consultation for placement of pigtail catheter which was performed on 8/8.  Assessment & Plan:   Active Problems:   Essential hypertension, benign   Pleural effusion on left   Atrial fibrillation with RVR (HCC)   Goals of care, counseling/discussion   Chronic respiratory failure with hypoxia and hypercapnia (HCC)   Acute on chronic combined systolic and diastolic CHF (congestive heart failure) (HCC)   Chronic respiratory failure with hypoxia (HCC)   Pressure injury of skin   Acute respiratory failure with hypoxia and hypercarbia (HCC)   Acute metabolic encephalopathy   Lobar pneumonia (HCC)   Pulmonary actinomycosis (HCC)   Empyema, left (HCC)  Acute on chronic respiratory failure with hypoxia and  hypercarbia -Multifactorial in the setting of hospital-acquired pneumonia, exudative pleural effusion, congestive heart failure and COPD. -She uses BiPAP at nighttime, continue. -Significantly improved.  Hospital-acquired pneumonia/exudative pleural effusion -With results of pleural fluid culture, antibiotics have been narrowed to doxycycline which should be sufficient coverage for both coag negative staph and actinomyces. Discontinue doxycycline 06/03/18. -Vancomycin discontinued as of 8/7.  Rocephin discontinued on 8/10. -Patient had chest tube placed by interventional radiology on 8/8. -Chest tube drainage: increased output yesterday reported as 1480 cc.  -Plan from IR team is to keep tube in for 2 days after output decreases to less than 20 cc/24 hours. -2 view xray from 8/11 shows improvement in size of pleural effusion.  Acute metabolic encephalopathy -Resolved and at baseline, daughter confirms. -Suspect due to hypercarbic respiratory failure as well as infectious process. -Continue BiPAP at bedtime.  Paroxysmal atrial fibrillation -Currently rate controlled. -Eliquis has been resumed after chest tube placement.  Monitoring for bleeding.    Sinus node dysfunction -Status post permanent pacemaker  Coronary artery disease -Stable, no chest pain -Continue bisoprolol.  Type 2 diabetes -stable and well controlled.   Acute on chronic kidney disease stage III -Baseline creatinine is around 1, creatinine peaked at 1.67. -Resolved and down to baseline of 0.82 on 8/7. Cr is 0.76 on 8/10.  DVT prophylaxis: Eliquis Code Status: DNR  Family Communication: patient Disposition Plan: SNF pending medical stability. SW aware.  I spoke with patient about LTAC and she is interested. I asked care manager to speak with her about it.      Consultants:   IR  Procedures:   Chest tube placement on 8/8  Antimicrobials:  Anti-infectives (From admission, onward)   Start     Dose/Rate  Route Frequency Ordered Stop   05/29/18 1545  doxycycline (VIBRA-TABS) tablet 100 mg     100 mg Oral Every 12 hours 05/29/18 1530 06/03/18 1229   05/25/18 1500  cefTRIAXone (ROCEPHIN) 2 g in sodium chloride 0.9 % 100 mL IVPB  Status:  Discontinued     2 g 200 mL/hr over 30 Minutes Intravenous Every 24 hours 05/25/18 1345 05/29/18 1530   05/24/18 0900  vancomycin (VANCOCIN) IVPB 750 mg/150 ml premix  Status:  Discontinued     750 mg 150 mL/hr over 60 Minutes Intravenous Every 24 hours 05/23/18 0741 05/26/18 1350   05/24/18 0800  ceFEPIme (MAXIPIME) 1 g in sodium chloride 0.9 % 100 mL IVPB  Status:  Discontinued     1 g 200 mL/hr over 30 Minutes Intravenous Every 24 hours 05/23/18 0740 05/25/18 1345   05/23/18 0830  vancomycin (VANCOCIN) 1,500 mg in sodium chloride 0.9 % 500 mL IVPB     1,500 mg 250 mL/hr over 120 Minutes Intravenous  Once 05/23/18 0737 05/23/18 1212   05/23/18 0830  ceFEPIme (MAXIPIME) 2 g in sodium chloride 0.9 % 100 mL IVPB     2 g 200 mL/hr over 30 Minutes Intravenous  Once 05/23/18 0739 05/23/18 1100     Subjective: Pt has no complaints today.   Pt is frustrated about long hospital stay.  Pt says that she has been ambulating.  She denies chest pain and shortness of breath.      Objective: Vitals:   06/03/18 2122 06/04/18 0534 06/04/18 0804 06/04/18 0809  BP: 101/60 109/71    Pulse: 76 83    Resp: 18 20    Temp: (!) 97.5 F (36.4 C) 97.7 F (36.5 C)    TempSrc: Oral Oral    SpO2: 97% 100% 98% 100%  Weight:  69.6 kg    Height:        Intake/Output Summary (Last 24 hours) at 06/04/2018 1055 Last data filed at 06/04/2018 0942 Gross per 24 hour  Intake 730 ml  Output 2480 ml  Net -1750 ml   Filed Weights   06/02/18 0556 06/03/18 0541 06/04/18 0534  Weight: 68.7 kg 69.9 kg 69.6 kg   Examination:  General exam: Alert, awake, oriented x 3 Respiratory system: slightly diminished BS bilateral.  Respiratory effort normal. Chest tube in place.   Cardiovascular system:normal s1,s2 sounds. No murmurs, rubs, gallops. Gastrointestinal system: Abdomen is nondistended, soft and nontender. No organomegaly or masses felt. Normal bowel sounds heard. Central nervous system: Alert and oriented. No focal neurological deficits. Extremities: No C/C/E, +pedal pulses Skin: No rashes, lesions or ulcers Psychiatry: Judgement and insight appear normal. Mood & affect appropriate.   Data Reviewed: I have personally reviewed following labs and imaging studies  CBC: Recent Labs  Lab 05/29/18 0628 05/31/18 0437  WBC 5.8 6.7  HGB 8.3* 8.5*  HCT 27.6* 28.3*  MCV 93.2 93.1  PLT 178 213   Basic Metabolic Panel: Recent Labs  Lab 05/29/18 0628 05/31/18 0437  NA 140 140  K 3.4* 4.2  CL 97* 99  CO2 36* 34*  GLUCOSE 94 96  BUN 25* 30*  CREATININE 0.76 0.86  CALCIUM 8.7* 8.9   GFR: Estimated Creatinine Clearance: 45.8 mL/min (by C-G formula based on SCr of 0.86 mg/dL). Liver Function Tests: No results for input(s): AST, ALT, ALKPHOS, BILITOT, PROT, ALBUMIN in the last 168 hours. No results for input(s):  LIPASE, AMYLASE in the last 168 hours. No results for input(s): AMMONIA in the last 168 hours. Coagulation Profile: No results for input(s): INR, PROTIME in the last 168 hours. Cardiac Enzymes: No results for input(s): CKTOTAL, CKMB, CKMBINDEX, TROPONINI in the last 168 hours. BNP (last 3 results) No results for input(s): PROBNP in the last 8760 hours. HbA1C: No results for input(s): HGBA1C in the last 72 hours. CBG: No results for input(s): GLUCAP in the last 168 hours. Lipid Profile: No results for input(s): CHOL, HDL, LDLCALC, TRIG, CHOLHDL, LDLDIRECT in the last 72 hours. Thyroid Function Tests: No results for input(s): TSH, T4TOTAL, FREET4, T3FREE, THYROIDAB in the last 72 hours. Anemia Panel: No results for input(s): VITAMINB12, FOLATE, FERRITIN, TIBC, IRON, RETICCTPCT in the last 72 hours. Urine analysis:    Component Value  Date/Time   COLORURINE YELLOW 05/23/2018 1250   APPEARANCEUR HAZY (A) 05/23/2018 1250   LABSPEC 1.018 05/23/2018 1250   PHURINE 5.0 05/23/2018 1250   GLUCOSEU NEGATIVE 05/23/2018 1250   HGBUR NEGATIVE 05/23/2018 1250   BILIRUBINUR NEGATIVE 05/23/2018 1250   KETONESUR 5 (A) 05/23/2018 1250   PROTEINUR 30 (A) 05/23/2018 1250   NITRITE NEGATIVE 05/23/2018 1250   LEUKOCYTESUR NEGATIVE 05/23/2018 1250    No results found for this or any previous visit (from the past 240 hour(s)).  Radiology Studies: No results found.  Scheduled Meds: . apixaban  5 mg Oral BID  . bisoprolol  2.5 mg Oral Daily  . budesonide (PULMICORT) nebulizer solution  0.5 mg Nebulization BID  . chlorhexidine  15 mL Mouth Rinse BID  . famotidine  20 mg Oral Daily  . furosemide  40 mg Oral Daily  . ipratropium-albuterol  3 mL Nebulization BID  . mouth rinse  15 mL Mouth Rinse q12n4p  . predniSONE  50 mg Oral Q breakfast  . sodium chloride flush  10-40 mL Intracatheter Q12H  . sodium chloride flush  3 mL Intravenous Q12H   Continuous Infusions: . sodium chloride Stopped (05/29/18 1900)     LOS: 14 days   Time spent: 23 minutes.   Standley Dakins, MD [facility] Pager [phone]  If 7PM-7AM, please contact night-coverage [email or web address] Password [facility] 06/04/2018, 10:55 AM

## 2018-06-05 LAB — BASIC METABOLIC PANEL
ANION GAP: 9 (ref 5–15)
BUN: 29 mg/dL — ABNORMAL HIGH (ref 8–23)
CALCIUM: 8.7 mg/dL — AB (ref 8.9–10.3)
CO2: 35 mmol/L — AB (ref 22–32)
Chloride: 93 mmol/L — ABNORMAL LOW (ref 98–111)
Creatinine, Ser: 0.83 mg/dL (ref 0.44–1.00)
GFR calc non Af Amer: >60 mL/min (ref >60)
Glucose, Bld: 92 mg/dL (ref 70–99)
POTASSIUM: 4.3 mmol/L (ref 3.5–5.1)
Sodium: 137 mmol/L (ref 135–145)

## 2018-06-05 LAB — CBC
HCT: 28.8 % — ABNORMAL LOW (ref 36.0–46.0)
Hemoglobin: 9 g/dL — ABNORMAL LOW (ref 12.0–15.0)
MCH: 28.4 pg (ref 26.0–34.0)
MCHC: 31.3 g/dL (ref 30.0–36.0)
MCV: 90.9 fL (ref 78.0–100.0)
Platelets: 251 10*3/uL (ref 150–400)
RBC: 3.17 MIL/uL — ABNORMAL LOW (ref 3.87–5.11)
RDW: 13.7 % (ref 11.5–15.5)
WBC: 9.3 10*3/uL (ref 4.0–10.5)

## 2018-06-05 MED ORDER — PREDNISONE 20 MG PO TABS
20.0000 mg | ORAL_TABLET | Freq: Every day | ORAL | Status: DC
  Administered 2018-06-06: 20 mg via ORAL
  Filled 2018-06-05: qty 1

## 2018-06-05 MED ORDER — SENNOSIDES-DOCUSATE SODIUM 8.6-50 MG PO TABS
1.0000 | ORAL_TABLET | Freq: Two times a day (BID) | ORAL | Status: DC
Start: 2018-06-05 — End: 2018-06-05

## 2018-06-05 MED ORDER — SENNOSIDES-DOCUSATE SODIUM 8.6-50 MG PO TABS
2.0000 | ORAL_TABLET | Freq: Two times a day (BID) | ORAL | Status: DC
  Administered 2018-06-05 – 2018-06-10 (×10): 2 via ORAL
  Filled 2018-06-05 (×11): qty 2

## 2018-06-05 NOTE — Progress Notes (Addendum)
PROGRESS NOTE    [name]  ZOX:[id number] DOB: 27-Jan-1931 DOA: 05/21/2018 PCP: Benita Stabile, MD   Brief Narrative:  84 year old woman admitted from home on 8/2 with complaints of dyspnea.  She has a history of COPD, combined heart failure, hypertension, paroxysmal atrial fibrillation, sinus node dysfunction, diabetes and coronary artery disease.  She states her shortness of breath has been ongoing for about 1 month but has significantly worsened over the past 2 to 3 days prior to admission.  She visited the emergency department on July 13 and was discharged with prednisone and nebulizers for a presumed COPD exacerbation.  She then had a follow-up with her PCP who diagnosed her with pneumonia and started on antibiotics.  Because of no improvement she was referred to the emergency department for evaluation.  Chest x-ray showed a left pleural effusion, subsequently underwent thoracentesis on 8/2 which removed 300 cc of fluid which was obviously exudative with greater than 1000 WBCs and cultures growing coag negative staph and actinomyces.  Case was discussed with thoracic surgery who did not believe she was an adequate candidate for thoracotomy and has instead recommended IR consultation for placement of pigtail catheter which was performed on 8/8.  Assessment & Plan:   Active Problems:   Essential hypertension, benign   Pleural effusion on left   Atrial fibrillation with RVR (HCC)   Goals of care, counseling/discussion   Chronic respiratory failure with hypoxia and hypercapnia (HCC)   Acute on chronic combined systolic and diastolic CHF (congestive heart failure) (HCC)   Chronic respiratory failure with hypoxia (HCC)   Pressure injury of skin   Acute respiratory failure with hypoxia and hypercarbia (HCC)   Acute metabolic encephalopathy   Lobar pneumonia (HCC)   Pulmonary actinomycosis (HCC)   Empyema, left (HCC)  Acute on chronic respiratory failure with hypoxia and  hypercarbia -Multifactorial in the setting of hospital-acquired pneumonia, exudative pleural effusion, congestive heart failure and COPD. -She uses BiPAP at nighttime, continue. -Significantly improved.  Weaning steroids.  DC Pulmicort nebs.  Continue scheduled duonebs.   Hospital-acquired pneumonia/exudative pleural effusion -With results of pleural fluid culture, antibiotics have been narrowed to doxycycline which should be sufficient coverage for both coag negative staph and actinomyces. Completed doxycycline 06/03/18. -Vancomycin discontinued as of 8/7.  Rocephin discontinued on 8/10. -Patient had chest tube placed by interventional radiology on 8/8. -Chest tube drainage: output down to 125 cc.  -Plan from IR team is to keep tube in for 2 days after output decreases to less than 20 cc/24 hours. -2 view xray from 8/11 shows improvement in size of pleural effusion.  Repeat chest xray 8/18.  -If output does not improve over next couple of days, will ask for surgery / pulm consult.  Acute metabolic encephalopathy -Resolved and at baseline, daughter confirms. -Suspect due to hypercarbic respiratory failure as well as infectious process. -Continue BiPAP at bedtime.  Paroxysmal atrial fibrillation -Currently rate controlled. -Eliquis has been resumed after chest tube placement.  Monitoring for bleeding.    Sinus node dysfunction -Status post permanent pacemaker  Coronary artery disease -Stable, no chest pain -Continue bisoprolol.  Type 2 diabetes -stable and well controlled.   Acute on chronic kidney disease stage III -Baseline creatinine is around 1, creatinine peaked at 1.67. -Resolved and down to baseline of 0.82 on 8/7. Cr is 0.76 on 8/10.  DVT prophylaxis: Eliquis Code Status: DNR  Family Communication: patient Disposition Plan: SNF pending medical stability. SW aware. [facility] bed offer [facility]  Consultants:   IR  Procedures:   Chest tube placement on  8/8  Antimicrobials:  Anti-infectives (From admission, onward)   Start     Dose/Rate Route Frequency Ordered Stop   05/29/18 1545  doxycycline (VIBRA-TABS) tablet 100 mg     100 mg Oral Every 12 hours 05/29/18 1530 06/03/18 1229   05/25/18 1500  cefTRIAXone (ROCEPHIN) 2 g in sodium chloride 0.9 % 100 mL IVPB  Status:  Discontinued     2 g 200 mL/hr over 30 Minutes Intravenous Every 24 hours 05/25/18 1345 05/29/18 1530   05/24/18 0900  vancomycin (VANCOCIN) IVPB 750 mg/150 ml premix  Status:  Discontinued     750 mg 150 mL/hr over 60 Minutes Intravenous Every 24 hours 05/23/18 0741 05/26/18 1350   05/24/18 0800  ceFEPIme (MAXIPIME) 1 g in sodium chloride 0.9 % 100 mL IVPB  Status:  Discontinued     1 g 200 mL/hr over 30 Minutes Intravenous Every 24 hours 05/23/18 0740 05/25/18 1345   05/23/18 0830  vancomycin (VANCOCIN) 1,500 mg in sodium chloride 0.9 % 500 mL IVPB     1,500 mg 250 mL/hr over 120 Minutes Intravenous  Once 05/23/18 0737 05/23/18 1212   05/23/18 0830  ceFEPIme (MAXIPIME) 2 g in sodium chloride 0.9 % 100 mL IVPB     2 g 200 mL/hr over 30 Minutes Intravenous  Once 05/23/18 0739 05/23/18 1100     Subjective: Pt has no complaints today.  Pt decided against LTAC.   Objective: Vitals:   06/05/18 0058 06/05/18 0622 06/05/18 0822 06/05/18 0828  BP:  (!) 106/58    Pulse: 78 74    Resp: 16 19    Temp:  98.6 F (37 C)    TempSrc:  Oral    SpO2: 96% 100% 99% 99%  Weight:  69.3 kg    Height:        Intake/Output Summary (Last 24 hours) at 06/05/2018 1031 Last data filed at 06/05/2018 0946 Gross per 24 hour  Intake 850 ml  Output 1125 ml  Net -275 ml   Filed Weights   06/03/18 0541 06/04/18 0534 06/05/18 0622  Weight: 69.9 kg 69.6 kg 69.3 kg   Examination:  General exam: Alert, awake, oriented x 3 Respiratory system: clear to auscultation.  Respiratory effort normal. Chest tube in place.  Cardiovascular system:normal s1,s2 sounds. No murmurs, rubs,  gallops. Gastrointestinal system: Abdomen is nondistended, soft and nontender. No organomegaly or masses felt. Normal bowel sounds heard. Central nervous system: Alert and oriented. No focal neurological deficits. Extremities: No C/C/E, +pedal pulses Skin: No rashes, lesions or ulcers Psychiatry: Judgement and insight appear normal. Mood & affect appropriate.   Data Reviewed: I have personally reviewed following labs and imaging studies  CBC: Recent Labs  Lab 05/31/18 0437 06/05/18 0709  WBC 6.7 9.3  HGB 8.5* 9.0*  HCT 28.3* 28.8*  MCV 93.1 90.9  PLT 213 251   Basic Metabolic Panel: Recent Labs  Lab 05/31/18 0437 06/05/18 0709  NA 140 137  K 4.2 4.3  CL 99 93*  CO2 34* 35*  GLUCOSE 96 92  BUN 30* 29*  CREATININE 0.86 0.83  CALCIUM 8.9 8.7*   GFR: Estimated Creatinine Clearance: 47.3 mL/min (by C-G formula based on SCr of 0.83 mg/dL). Liver Function Tests: No results for input(s): AST, ALT, ALKPHOS, BILITOT, PROT, ALBUMIN in the last 168 hours. No results for input(s): LIPASE, AMYLASE in the last 168 hours. No results for input(s): AMMONIA in the  last 168 hours. Coagulation Profile: No results for input(s): INR, PROTIME in the last 168 hours. Cardiac Enzymes: No results for input(s): CKTOTAL, CKMB, CKMBINDEX, TROPONINI in the last 168 hours. BNP (last 3 results) No results for input(s): PROBNP in the last 8760 hours. HbA1C: No results for input(s): HGBA1C in the last 72 hours. CBG: No results for input(s): GLUCAP in the last 168 hours. Lipid Profile: No results for input(s): CHOL, HDL, LDLCALC, TRIG, CHOLHDL, LDLDIRECT in the last 72 hours. Thyroid Function Tests: No results for input(s): TSH, T4TOTAL, FREET4, T3FREE, THYROIDAB in the last 72 hours. Anemia Panel: No results for input(s): VITAMINB12, FOLATE, FERRITIN, TIBC, IRON, RETICCTPCT in the last 72 hours. Urine analysis:    Component Value Date/Time   COLORURINE YELLOW 05/23/2018 1250   APPEARANCEUR  HAZY (A) 05/23/2018 1250   LABSPEC 1.018 05/23/2018 1250   PHURINE 5.0 05/23/2018 1250   GLUCOSEU NEGATIVE 05/23/2018 1250   HGBUR NEGATIVE 05/23/2018 1250   BILIRUBINUR NEGATIVE 05/23/2018 1250   KETONESUR 5 (A) 05/23/2018 1250   PROTEINUR 30 (A) 05/23/2018 1250   NITRITE NEGATIVE 05/23/2018 1250   LEUKOCYTESUR NEGATIVE 05/23/2018 1250   No results found for this or any previous visit (from the past 240 hour(s)).  Radiology Studies: Dg Chest 1 View  Result Date: 06/04/2018 CLINICAL DATA:  Pleural effusion EXAM: CHEST  1 VIEW COMPARISON:  05/30/2018, 05/29/2018, CT 05/24/2018 FINDINGS: Left-sided pacing device as before. Right upper extremity catheter tip overlies the distal SVC. There are small bilateral pleural effusions. There is a left lower chest drainage catheter tip appears slightly more lateral as compared with 05/29/2018. There is decreased left pleural effusion. Stable enlarged cardiomediastinal silhouette with aortic atherosclerosis. No pneumothorax is seen. IMPRESSION: 1. Left lung base drainage catheter remains in place. There appears to be decreased left effusion. 2. There are small pleural effusions bilaterally. 3. There is stable cardiomegaly. Electronically Signed   By: Jasmine PangKim  Fujinaga M.D.   On: 06/04/2018 16:34   Scheduled Meds: . apixaban  5 mg Oral BID  . bisoprolol  2.5 mg Oral Daily  . budesonide (PULMICORT) nebulizer solution  0.5 mg Nebulization BID  . chlorhexidine  15 mL Mouth Rinse BID  . famotidine  20 mg Oral Daily  . furosemide  40 mg Oral Daily  . ipratropium-albuterol  3 mL Nebulization BID  . mouth rinse  15 mL Mouth Rinse q12n4p  . predniSONE  50 mg Oral Q breakfast  . sodium chloride flush  10-40 mL Intracatheter Q12H  . sodium chloride flush  3 mL Intravenous Q12H   Continuous Infusions: . sodium chloride Stopped (05/29/18 1900)    LOS: 15 days   Time spent: 21 minutes.   Standley Dakinslanford Johnson, MD [facility] Pager [phone]  If  7PM-7AM, please contact night-coverage [email or web address] Password [email or web address] 06/05/2018, 10:31 AM

## 2018-06-06 ENCOUNTER — Inpatient Hospital Stay (HOSPITAL_COMMUNITY): Payer: Medicare Other

## 2018-06-06 LAB — BASIC METABOLIC PANEL
Anion gap: 8 (ref 5–15)
BUN: 30 mg/dL — AB (ref 8–23)
CHLORIDE: 94 mmol/L — AB (ref 98–111)
CO2: 34 mmol/L — AB (ref 22–32)
CREATININE: 0.84 mg/dL (ref 0.44–1.00)
Calcium: 8.5 mg/dL — ABNORMAL LOW (ref 8.9–10.3)
GFR calc Af Amer: >60 mL/min (ref >60)
GFR calc non Af Amer: >60 mL/min (ref >60)
Glucose, Bld: 96 mg/dL (ref 70–99)
POTASSIUM: 4 mmol/L (ref 3.5–5.1)
Sodium: 136 mmol/L (ref 135–145)

## 2018-06-06 LAB — CBC
HCT: 27.8 % — ABNORMAL LOW (ref 36.0–46.0)
Hemoglobin: 8.3 g/dL — ABNORMAL LOW (ref 12.0–15.0)
MCH: 27.2 pg (ref 26.0–34.0)
MCHC: 29.9 g/dL — ABNORMAL LOW (ref 30.0–36.0)
MCV: 91.1 fL (ref 78.0–100.0)
Platelets: 224 K/uL (ref 150–400)
RBC: 3.05 MIL/uL — ABNORMAL LOW (ref 3.87–5.11)
RDW: 13.8 % (ref 11.5–15.5)
WBC: 8.2 K/uL (ref 4.0–10.5)

## 2018-06-06 LAB — MAGNESIUM: Magnesium: 2.1 mg/dL (ref 1.7–2.4)

## 2018-06-06 MED ORDER — POLYETHYLENE GLYCOL 3350 17 G PO PACK
17.0000 g | PACK | Freq: Every day | ORAL | Status: AC
  Administered 2018-06-06 – 2018-06-07 (×2): 17 g via ORAL
  Filled 2018-06-06 (×4): qty 1

## 2018-06-06 MED ORDER — PREDNISONE 10 MG PO TABS
15.0000 mg | ORAL_TABLET | Freq: Every day | ORAL | Status: DC
  Administered 2018-06-07 – 2018-06-08 (×2): 15 mg via ORAL
  Filled 2018-06-06 (×2): qty 2

## 2018-06-06 MED ORDER — BISACODYL 10 MG RE SUPP
10.0000 mg | Freq: Every day | RECTAL | Status: DC | PRN
  Administered 2018-06-06: 10 mg via RECTAL
  Filled 2018-06-06: qty 1

## 2018-06-06 MED ORDER — FUROSEMIDE 10 MG/ML IJ SOLN
40.0000 mg | Freq: Two times a day (BID) | INTRAMUSCULAR | Status: DC
  Administered 2018-06-06 – 2018-06-09 (×7): 40 mg via INTRAVENOUS
  Filled 2018-06-06 (×7): qty 4

## 2018-06-06 NOTE — Progress Notes (Addendum)
PROGRESS NOTE   Kathy CuriaVirginia L Huy  WJX:[id number] DOB: 04/08/1931 DOA: 05/21/2018 PCP: Benita StabileHall, John Z, MD   Brief Narrative:  84 year old woman admitted from home on 8/2 with complaints of dyspnea.  She has a history of COPD, combined heart failure, hypertension, paroxysmal atrial fibrillation, sinus node dysfunction, diabetes and coronary artery disease.  She states her shortness of breath has been ongoing for about 1 month but has significantly worsened over the past 2 to 3 days prior to admission.  She visited the emergency department on July 13 and was discharged with prednisone and nebulizers for a presumed COPD exacerbation.  She then had a follow-up with her PCP who diagnosed her with pneumonia and started on antibiotics.  Because of no improvement she was referred to the emergency department for evaluation.  Chest x-ray showed a left pleural effusion, subsequently underwent thoracentesis on 8/2 which removed 300 cc of fluid which was obviously exudative with greater than 1000 WBCs and cultures growing coag negative staph and actinomyces.  Case was discussed with thoracic surgery who did not believe she wasn't adequate candidate for thoracotomy and has instead recommended IR consultation for placement of pigtail catheter which was performed on 8/8.  Assessment & Plan:   Active Problems:   Essential hypertension, benign   Pleural effusion on left   Atrial fibrillation with RVR (HCC)   Goals of care, counseling/discussion   Chronic respiratory failure with hypoxia and hypercapnia (HCC)   Acute on chronic combined systolic and diastolic CHF (congestive heart failure) (HCC)   Chronic respiratory failure with hypoxia (HCC)   Pressure injury of skin   Acute respiratory failure with hypoxia and hypercarbia (HCC)   Acute metabolic encephalopathy   Lobar pneumonia (HCC)   Pulmonary actinomycosis (HCC)   Empyema, left (HCC)  Acute on chronic respiratory failure with hypoxia and  hypercarbia -Multifactorial in the setting of hospital-acquired pneumonia, exudative pleural effusion, congestive heart failure and COPD. -She uses BiPAP at nighttime, continue. -Significantly improved.  Weaning steroids.  DC Pulmicort nebs.  Continue scheduled duonebs.   Acute on chronic systolic Heart failure - IV lasix ordered, she is overall 2 liters negative, recheck CXR in AM.  Resume oral lasix when IV doses completed.   Hospital-acquired pneumonia/exudative pleural effusion -With results of pleural fluid culture, antibiotics have been narrowed to doxycycline which should be sufficient coverage for both coag negative staph and actinomyces. Completed doxycycline 06/03/18. -Vancomycin discontinued as of 8/7.  Rocephin discontinued on 8/10. -Patient had chest tube placed by interventional radiology on 8/8. -Chest tube drainage: output down to 60 cc.  -Plan from IR team is to keep tube in for 2 days after output decreases to less than 20 cc/24 hours. -2 view xray from 8/11 shows improvement in size of pleural effusion.  Repeat chest xray 8/18 with pulm edema.  -If output does not improve over next couple of days, will ask for surgery / pulm consult.  Hopeful to be able to pull chest tube in next couple of days.   Acute metabolic encephalopathy -Resolved and at baseline, daughter confirms. -Suspect due to hypercarbic respiratory failure as well as infectious process. -Continue BiPAP at bedtime.  Paroxysmal atrial fibrillation -Currently rate controlled. -Eliquis has been resumed after chest tube placement.  Monitoring for bleeding.    Sinus node dysfunction -Status post permanent pacemaker  Coronary artery disease -Stable, no chest pain -Continue bisoprolol.  Type 2 diabetes -stable and well controlled.   Acute on chronic kidney disease stage III -Baseline creatinine  is around 1, creatinine peaked at 1.67. -Resolved and down to baseline of 0.82 on 8/7. Cr is 0.76 on  8/10.  Constipation - Added suppository and miralax  DVT prophylaxis: Eliquis Code Status: DNR  Family Communication: family at bedside Disposition Plan: SNF pending medical stability. SW aware. [facility] bed [facility] Consultants:   IR  Procedures:   Chest tube placement on 8/8  Antimicrobials:  Anti-infectives (From admission, onward)   Start     Dose/Rate Route Frequency Ordered Stop   05/29/18 1545  doxycycline (VIBRA-TABS) tablet 100 mg     100 mg Oral Every 12 hours 05/29/18 1530 06/03/18 1229   05/25/18 1500  cefTRIAXone (ROCEPHIN) 2 g in sodium chloride 0.9 % 100 mL IVPB  Status:  Discontinued     2 g 200 mL/hr over 30 Minutes Intravenous Every 24 hours 05/25/18 1345 05/29/18 1530   05/24/18 0900  vancomycin (VANCOCIN) IVPB 750 mg/150 ml premix  Status:  Discontinued     750 mg 150 mL/hr over 60 Minutes Intravenous Every 24 hours 05/23/18 0741 05/26/18 1350   05/24/18 0800  ceFEPIme (MAXIPIME) 1 g in sodium chloride 0.9 % 100 mL IVPB  Status:  Discontinued     1 g 200 mL/hr over 30 Minutes Intravenous Every 24 hours 05/23/18 0740 05/25/18 1345   05/23/18 0830  vancomycin (VANCOCIN) 1,500 mg in sodium chloride 0.9 % 500 mL IVPB     1,500 mg 250 mL/hr over 120 Minutes Intravenous  Once 05/23/18 0737 05/23/18 1212   05/23/18 0830  ceFEPIme (MAXIPIME) 2 g in sodium chloride 0.9 % 100 mL IVPB     2 g 200 mL/hr over 30 Minutes Intravenous  Once 05/23/18 0739 05/23/18 1100     Subjective: Pt is becoming disheartened about long hospital stay.  I told her that her chest tube output has decreased by half since yesterday which is encouraging. We hope to be able to pull the chest tube in next couple of days.   Objective: Vitals:   06/05/18 1518 06/05/18 1919 06/05/18 2256 06/06/18 0525  BP: (!) 112/55   94/80  Pulse: 91  82 91  Resp: 18  16 19   Temp: 98.4 F (36.9 C)   97.9 F (36.6 C)  TempSrc: Oral   Oral  SpO2: 95% 96% 98% 100%  Weight:      Height:         Intake/Output Summary (Last 24 hours) at 06/06/2018 1040 Last data filed at 06/06/2018 1026 Gross per 24 hour  Intake 253 ml  Output 1060 ml  Net -807 ml   Filed Weights   06/03/18 0541 06/04/18 0534 06/05/18 0622  Weight: 69.9 kg 69.6 kg 69.3 kg   Examination:  General exam: Alert, awake, oriented x 3 Respiratory system: clear to auscultation.  Respiratory effort normal. Chest tube in place.  Cardiovascular system:normal s1,s2 sounds. No murmurs, rubs, gallops. Gastrointestinal system: Abdomen is nondistended, soft and nontender. No organomegaly or masses felt. Normal bowel sounds heard. Central nervous system: Alert and oriented. No focal neurological deficits. Extremities: No C/C/E, +pedal pulses Skin: No rashes, lesions or ulcers Psychiatry: Judgement and insight appear normal. Mood & affect appropriate.   Data Reviewed: I have personally reviewed following labs and imaging studies  CBC: Recent Labs  Lab 05/31/18 0437 06/05/18 0709 06/06/18 0623  WBC 6.7 9.3 8.2  HGB 8.5* 9.0* 8.3*  HCT 28.3* 28.8* 27.8*  MCV 93.1 90.9 91.1  PLT 213 251 224   Basic  Metabolic Panel: Recent Labs  Lab 05/31/18 0437 06/05/18 0709 06/06/18 0623  NA 140 137 136  K 4.2 4.3 4.0  CL 99 93* 94*  CO2 34* 35* 34*  GLUCOSE 96 92 96  BUN 30* 29* 30*  CREATININE 0.86 0.83 0.84  CALCIUM 8.9 8.7* 8.5*  MG  --   --  2.1   GFR: Estimated Creatinine Clearance: 46.8 mL/min (by C-G formula based on SCr of 0.84 mg/dL). Liver Function Tests: No results for input(s): AST, ALT, ALKPHOS, BILITOT, PROT, ALBUMIN in the last 168 hours. No results for input(s): LIPASE, AMYLASE in the last 168 hours. No results for input(s): AMMONIA in the last 168 hours. Coagulation Profile: No results for input(s): INR, PROTIME in the last 168 hours. Cardiac Enzymes: No results for input(s): CKTOTAL, CKMB, CKMBINDEX, TROPONINI in the last 168 hours. BNP (last 3 results) No results for input(s): PROBNP in the  last 8760 hours. HbA1C: No results for input(s): HGBA1C in the last 72 hours. CBG: No results for input(s): GLUCAP in the last 168 hours. Lipid Profile: No results for input(s): CHOL, HDL, LDLCALC, TRIG, CHOLHDL, LDLDIRECT in the last 72 hours. Thyroid Function Tests: No results for input(s): TSH, T4TOTAL, FREET4, T3FREE, THYROIDAB in the last 72 hours. Anemia Panel: No results for input(s): VITAMINB12, FOLATE, FERRITIN, TIBC, IRON, RETICCTPCT in the last 72 hours. Urine analysis:    Component Value Date/Time   COLORURINE YELLOW 05/23/2018 1250   APPEARANCEUR HAZY (A) 05/23/2018 1250   LABSPEC 1.018 05/23/2018 1250   PHURINE 5.0 05/23/2018 1250   GLUCOSEU NEGATIVE 05/23/2018 1250   HGBUR NEGATIVE 05/23/2018 1250   BILIRUBINUR NEGATIVE 05/23/2018 1250   KETONESUR 5 (A) 05/23/2018 1250   PROTEINUR 30 (A) 05/23/2018 1250   NITRITE NEGATIVE 05/23/2018 1250   LEUKOCYTESUR NEGATIVE 05/23/2018 1250   No results found for this or any previous visit (from the past 240 hour(s)).  Radiology Studies: Dg Chest 1 View  Result Date: 06/04/2018 CLINICAL DATA:  Pleural effusion EXAM: CHEST  1 VIEW COMPARISON:  05/30/2018, 05/29/2018, CT 05/24/2018 FINDINGS: Left-sided pacing device as before. Right upper extremity catheter tip overlies the distal SVC. There are small bilateral pleural effusions. There is a left lower chest drainage catheter tip appears slightly more lateral as compared with 05/29/2018. There is decreased left pleural effusion. Stable enlarged cardiomediastinal silhouette with aortic atherosclerosis. No pneumothorax is seen. IMPRESSION: 1. Left lung base drainage catheter remains in place. There appears to be decreased left effusion. 2. There are small pleural effusions bilaterally. 3. There is stable cardiomegaly. Electronically Signed   By: Jasmine PangKim  Fujinaga M.D.   On: 06/04/2018 16:34   Dg Chest Port 1 View  Result Date: 06/06/2018 CLINICAL DATA:  Pleural effusion EXAM: PORTABLE CHEST  1 VIEW COMPARISON:  06/04/2018 FINDINGS: Worsening bilateral airspace disease. Progressive bilateral effusions with bibasilar atelectasis. Dual lead pacemaker unchanged. Left pleural pigtail catheter remains in place in the base. No pneumothorax. Right sided PICC tip in the SVC unchanged. IMPRESSION: Worsening diffuse bilateral airspace disease and bilateral effusions consistent with heart failure and edema. Pigtail chest tube on the left  without pneumothorax. Electronically Signed   By: Marlan Palauharles  Clark M.D.   On: 06/06/2018 08:39   Scheduled Meds: . apixaban  5 mg Oral BID  . bisoprolol  2.5 mg Oral Daily  . chlorhexidine  15 mL Mouth Rinse BID  . famotidine  20 mg Oral Daily  . furosemide  40 mg Intravenous Q12H  . ipratropium-albuterol  3 mL Nebulization  BID  . mouth rinse  15 mL Mouth Rinse q12n4p  . [START ON 06/07/2018] predniSONE  15 mg Oral Q breakfast  . senna-docusate  2 tablet Oral BID  . sodium chloride flush  10-40 mL Intracatheter Q12H  . sodium chloride flush  3 mL Intravenous Q12H   Continuous Infusions: . sodium chloride Stopped (05/29/18 1900)    LOS: 16 days   Time spent: 22 minutes   Standley Dakins, MD [facility] Pager [phone]  If 7PM-7AM, please contact night-coverage [email or web address] Password [email or web address] 06/06/2018, 10:40 AM

## 2018-06-07 ENCOUNTER — Inpatient Hospital Stay (HOSPITAL_COMMUNITY): Payer: Medicare Other

## 2018-06-07 DIAGNOSIS — J9602 Acute respiratory failure with hypercapnia: Secondary | ICD-10-CM

## 2018-06-07 DIAGNOSIS — A420 Pulmonary actinomycosis: Secondary | ICD-10-CM

## 2018-06-07 DIAGNOSIS — J9601 Acute respiratory failure with hypoxia: Secondary | ICD-10-CM

## 2018-06-07 DIAGNOSIS — I5043 Acute on chronic combined systolic (congestive) and diastolic (congestive) heart failure: Secondary | ICD-10-CM

## 2018-06-07 DIAGNOSIS — I10 Essential (primary) hypertension: Secondary | ICD-10-CM

## 2018-06-07 DIAGNOSIS — G9341 Metabolic encephalopathy: Secondary | ICD-10-CM

## 2018-06-07 DIAGNOSIS — J181 Lobar pneumonia, unspecified organism: Secondary | ICD-10-CM

## 2018-06-07 LAB — COMPREHENSIVE METABOLIC PANEL
ALT: 48 U/L — ABNORMAL HIGH (ref 0–44)
ANION GAP: 11 (ref 5–15)
AST: 22 U/L (ref 15–41)
Albumin: 3.2 g/dL — ABNORMAL LOW (ref 3.5–5.0)
Alkaline Phosphatase: 46 U/L (ref 38–126)
BUN: 29 mg/dL — ABNORMAL HIGH (ref 8–23)
CHLORIDE: 89 mmol/L — AB (ref 98–111)
CO2: 37 mmol/L — ABNORMAL HIGH (ref 22–32)
Calcium: 9 mg/dL (ref 8.9–10.3)
Creatinine, Ser: 0.89 mg/dL (ref 0.44–1.00)
GFR calc Af Amer: >60 mL/min (ref >60)
GFR, EST NON AFRICAN AMERICAN: 57 mL/min — AB (ref >60)
Glucose, Bld: 158 mg/dL — ABNORMAL HIGH (ref 70–99)
POTASSIUM: 3.7 mmol/L (ref 3.5–5.1)
Sodium: 137 mmol/L (ref 135–145)
TOTAL PROTEIN: 5.7 g/dL — AB (ref 6.5–8.1)
Total Bilirubin: 0.8 mg/dL (ref 0.3–1.2)

## 2018-06-07 MED ORDER — SORBITOL 70 % SOLN
960.0000 mL | TOPICAL_OIL | Freq: Once | ORAL | Status: AC
  Administered 2018-06-07: 960 mL via RECTAL
  Filled 2018-06-07: qty 473

## 2018-06-07 NOTE — Progress Notes (Signed)
PROGRESS NOTE   Kathy CuriaVirginia L Page  ONG:[id number] DOB: 01/29/1931 DOA: 05/21/2018 PCP: Benita StabileHall, John Z, MD   Brief Narrative:  84 year old woman admitted from home on 8/2 with complaints of dyspnea.  She has a history of COPD, combined heart failure, hypertension, paroxysmal atrial fibrillation, sinus node dysfunction, diabetes and coronary artery disease.  She states her shortness of breath has been ongoing for about 1 month but has significantly worsened over the past 2 to 3 days prior to admission.  She visited the emergency department on July 13 and was discharged with prednisone and nebulizers for a presumed COPD exacerbation.  She then had a follow-up with her PCP who diagnosed her with pneumonia and started on antibiotics.  Because of no improvement she was referred to the emergency department for evaluation.  Chest x-ray showed a left pleural effusion, subsequently underwent thoracentesis on 8/2 which removed 300 cc of fluid which was obviously exudative with greater than 1000 WBCs and cultures growing coag negative staph and actinomyces.  Case was discussed with thoracic surgery who did not believe she wasn't adequate candidate for thoracotomy and has instead recommended IR consultation for placement of pigtail catheter which was performed on 8/8.  Assessment & Plan:   Active Problems:   Essential hypertension, benign   Pleural effusion on left   Atrial fibrillation with RVR (HCC)   Goals of care, counseling/discussion   Chronic respiratory failure with hypoxia and hypercapnia (HCC)   Acute on chronic combined systolic and diastolic CHF (congestive heart failure) (HCC)   Chronic respiratory failure with hypoxia (HCC)   Pressure injury of skin   Acute respiratory failure with hypoxia and hypercarbia (HCC)   Acute metabolic encephalopathy   Lobar pneumonia (HCC)   Pulmonary actinomycosis (HCC)   Empyema, left (HCC)  Acute on chronic respiratory failure with hypoxia and  hypercarbia -Multifactorial in the setting of hospital-acquired pneumonia, exudative pleural effusion, congestive heart failure and COPD. -She uses BiPAP at nighttime, continue. -Significantly improved.  Weaning steroids.  DC Pulmicort nebs.  Continue scheduled duonebs.   Acute on chronic systolic Heart failure -currently on IV Lasix.  Repeat chest x-ray this morning shows improvement of pulmonary edema..  Resume oral lasix when IV doses completed.   Hospital-acquired pneumonia/exudative pleural effusion -With results of pleural fluid culture, antibiotics have been narrowed to doxycycline which should be sufficient coverage for both coag negative staph and actinomyces. Completed doxycycline 06/03/18. -Vancomycin discontinued as of 8/7.  Rocephin discontinued on 8/10. -Patient had chest tube placed by interventional radiology on 8/8. -Chest tube drainage: output down to 60 cc.  -Plan from IR team is to keep tube in for 2 days after output decreases to less than 20 cc/24 hours. -2 view xray from 8/11 shows improvement in size of pleural effusion.  Repeat chest xray 8/18 with pulm edema.  -Pulmonology following  Acute metabolic encephalopathy -Resolved and at baseline, daughter confirms. -Suspect due to hypercarbic respiratory failure as well as infectious process. -Continue BiPAP at bedtime.  Paroxysmal atrial fibrillation -Currently rate controlled. -Eliquis has been resumed after chest tube placement.  Monitoring for bleeding.    Sinus node dysfunction -Status post permanent pacemaker  Coronary artery disease -Stable, no chest pain -Continue bisoprolol.  Type 2 diabetes -stable and well controlled.   Acute on chronic kidney disease stage III -Baseline creatinine is around 1, creatinine peaked at 1.67. -Resolved and down to baseline of 0.82 on 8/7. Cr is 0.76 on 8/10.  Constipation -We will give enema today  DVT prophylaxis: Eliquis Code Status: DNR  Family Communication: No  family present Disposition Plan: SNF pending medical stability. SW aware. Has bed offer from [facility] Consultants:   IR  Pulmonology  Procedures:   Chest tube placement on 8/8  Antimicrobials:  Anti-infectives (From admission, onward)   Start     Dose/Rate Route Frequency Ordered Stop   05/29/18 1545  doxycycline (VIBRA-TABS) tablet 100 mg     100 mg Oral Every 12 hours 05/29/18 1530 06/03/18 1229   05/25/18 1500  cefTRIAXone (ROCEPHIN) 2 g in sodium chloride 0.9 % 100 mL IVPB  Status:  Discontinued     2 g 200 mL/hr over 30 Minutes Intravenous Every 24 hours 05/25/18 1345 05/29/18 1530   05/24/18 0900  vancomycin (VANCOCIN) IVPB 750 mg/150 ml premix  Status:  Discontinued     750 mg 150 mL/hr over 60 Minutes Intravenous Every 24 hours 05/23/18 0741 05/26/18 1350   05/24/18 0800  ceFEPIme (MAXIPIME) 1 g in sodium chloride 0.9 % 100 mL IVPB  Status:  Discontinued     1 g 200 mL/hr over 30 Minutes Intravenous Every 24 hours 05/23/18 0740 05/25/18 1345   05/23/18 0830  vancomycin (VANCOCIN) 1,500 mg in sodium chloride 0.9 % 500 mL IVPB     1,500 mg 250 mL/hr over 120 Minutes Intravenous  Once 05/23/18 0737 05/23/18 1212   05/23/18 0830  ceFEPIme (MAXIPIME) 2 g in sodium chloride 0.9 % 100 mL IVPB     2 g 200 mL/hr over 30 Minutes Intravenous  Once 05/23/18 0739 05/23/18 1100     Subjective: Denies any worsening shortness of breath.  No cough.  Complains that she has not had a bowel movement in several days  Objective: Vitals:   06/06/18 2157 06/06/18 2200 06/07/18 0521 06/07/18 1423  BP: 102/61  (!) 98/51 118/78  Pulse: 95 76 92 (!) 108  Resp: 18 16 20 20   Temp: 98.3 F (36.8 C)  (!) 97.5 F (36.4 C) (!) 97.2 F (36.2 C)  TempSrc: Oral  Oral Oral  SpO2: 100% 96% 100% 99%  Weight:      Height:        Intake/Output Summary (Last 24 hours) at 06/07/2018 2010 Last data filed at 06/07/2018 1900 Gross per 24 hour  Intake 720 ml  Output 1325 ml  Net -605 ml   Filed  Weights   06/03/18 0541 06/04/18 0534 06/05/18 0622  Weight: 69.9 kg 69.6 kg 69.3 kg   Examination:  General exam: Alert, awake, oriented x 3 Respiratory system: Crackles at bases. Respiratory effort normal.  Pigtail catheter in left chest Cardiovascular system:RRR. No murmurs, rubs, gallops. Gastrointestinal system: Abdomen is nondistended, soft and nontender. No organomegaly or masses felt. Normal bowel sounds heard. Central nervous system: Alert and oriented. No focal neurological deficits. Extremities: No C/C/E, +pedal pulses Skin: No rashes, lesions or ulcers Psychiatry: Judgement and insight appear normal. Mood & affect appropriate.     Data Reviewed: I have personally reviewed following labs and imaging studies  CBC: Recent Labs  Lab 06/05/18 0709 06/06/18 0623  WBC 9.3 8.2  HGB 9.0* 8.3*  HCT 28.8* 27.8*  MCV 90.9 91.1  PLT 251 224   Basic Metabolic Panel: Recent Labs  Lab 06/05/18 0709 06/06/18 0623 06/07/18 0921  NA 137 136 137  K 4.3 4.0 3.7  CL 93* 94* 89*  CO2 35* 34* 37*  GLUCOSE 92 96 158*  BUN 29* 30* 29*  CREATININE 0.83 0.84 0.89  CALCIUM 8.7* 8.5* 9.0  MG  --  2.1  --    GFR: Estimated Creatinine Clearance: 44.1 mL/min (by C-G formula based on SCr of 0.89 mg/dL). Liver Function Tests: Recent Labs  Lab 06/07/18 0921  AST 22  ALT 48*  ALKPHOS 46  BILITOT 0.8  PROT 5.7*  ALBUMIN 3.2*   No results for input(s): LIPASE, AMYLASE in the last 168 hours. No results for input(s): AMMONIA in the last 168 hours. Coagulation Profile: No results for input(s): INR, PROTIME in the last 168 hours. Cardiac Enzymes: No results for input(s): CKTOTAL, CKMB, CKMBINDEX, TROPONINI in the last 168 hours. BNP (last 3 results) No results for input(s): PROBNP in the last 8760 hours. HbA1C: No results for input(s): HGBA1C in the last 72 hours. CBG: No results for input(s): GLUCAP in the last 168 hours. Lipid Profile: No results for input(s): CHOL, HDL,  LDLCALC, TRIG, CHOLHDL, LDLDIRECT in the last 72 hours. Thyroid Function Tests: No results for input(s): TSH, T4TOTAL, FREET4, T3FREE, THYROIDAB in the last 72 hours. Anemia Panel: No results for input(s): VITAMINB12, FOLATE, FERRITIN, TIBC, IRON, RETICCTPCT in the last 72 hours. Urine analysis:    Component Value Date/Time   COLORURINE YELLOW 05/23/2018 1250   APPEARANCEUR HAZY (A) 05/23/2018 1250   LABSPEC 1.018 05/23/2018 1250   PHURINE 5.0 05/23/2018 1250   GLUCOSEU NEGATIVE 05/23/2018 1250   HGBUR NEGATIVE 05/23/2018 1250   BILIRUBINUR NEGATIVE 05/23/2018 1250   KETONESUR 5 (A) 05/23/2018 1250   PROTEINUR 30 (A) 05/23/2018 1250   NITRITE NEGATIVE 05/23/2018 1250   LEUKOCYTESUR NEGATIVE 05/23/2018 1250   Recent Results (from the past 240 hour(s))  Body fluid culture     Status: None (Preliminary result)   Collection Time: 06/07/18  9:21 AM  Result Value Ref Range Status   Specimen Description   Final    PLEURAL CHEST TUBE Performed at [facility], [address] [place], [place] [address]    Special Requests   Final    Normal Performed at [facility], [address], [place], [place] [address]    Gram Stain   Final    NO WBC SEEN NO ORGANISMS SEEN Performed at [facility], [address] [place], [place] [address]    Culture PENDING  Incomplete   Report Status PENDING  Incomplete    Radiology Studies: Dg Chest Port 1 View  Result Date: 06/07/2018 CLINICAL DATA:  Pulmonary edema. EXAM: PORTABLE CHEST 1 VIEW COMPARISON:  Eighteen 2019 and prior radiographs FINDINGS: Cardiomegaly, RIGHT PICC line with tip overlying the LOWER SVC, LEFT pacemaker and LEFT thoracostomy tube again noted. Decreased pulmonary edema identified with improved bilateral LOWER lung aeration. There is no evidence of pneumothorax. No acute bony abnormalities are present. IMPRESSION: Decreased pulmonary edema with improved bilateral LOWER lung aeration. Electronically Signed   By: Harmon Pier  M.D.   On: 06/07/2018 08:26   Dg Chest Port 1 View  Result Date: 06/06/2018 CLINICAL DATA:  Pleural effusion EXAM: PORTABLE CHEST 1 VIEW COMPARISON:  06/04/2018 FINDINGS: Worsening bilateral airspace disease. Progressive bilateral effusions with bibasilar atelectasis. Dual lead pacemaker unchanged. Left pleural pigtail catheter remains in place in the base. No pneumothorax. Right sided PICC tip in the SVC unchanged. IMPRESSION: Worsening diffuse bilateral airspace disease and bilateral effusions consistent with heart failure and edema. Pigtail chest tube on the left  without pneumothorax. Electronically Signed   By: Marlan Palau M.D.   On: 06/06/2018 08:39   Scheduled Meds: . apixaban  5 mg Oral BID  . bisoprolol  2.5 mg Oral Daily  . chlorhexidine  15 mL Mouth Rinse BID  . famotidine  20 mg Oral Daily  . furosemide  40 mg Intravenous Q12H  . mouth rinse  15 mL Mouth Rinse q12n4p  . polyethylene glycol  17 g Oral Daily  . predniSONE  15 mg Oral Q breakfast  . senna-docusate  2 tablet Oral BID  . sodium chloride flush  10-40 mL Intracatheter Q12H  . sodium chloride flush  3 mL Intravenous Q12H   Continuous Infusions: . sodium chloride Stopped (05/29/18 1900)    LOS: 17 days   Time spent: 30mins  Erick BlinksJehanzeb Isamu Trammel, MD [facility] Pager [phone]  If 7PM-7AM, please contact night-coverage [email or web address] Password [email or web address] 06/07/2018, 8:10 PM

## 2018-06-07 NOTE — Consult Note (Signed)
Consult requested by: Dr. Glenna FellowsJohnson/Memon Consult requested for: Pleural fluid drainage  HPI: This is an 84 year old who came to the hospital on the second of this month with shortness of breath.  She is known to have COPD combined heart failure hypertension paroxysmal atrial fib diabetes coronary artery disease.  She had been having shortness of breath for about a month but it had gotten worse in the 2 to 3 days prior to admission.  She had been to the emergency department on July 13 and was given prednisone and nebulizers for presumed COPD exacerbation.  She went to her PCP had another chest x-ray was started on antibiotics for pneumonia.  She did not improve and actually got worse and she came to the emergency department.  She had a left pleural effusion underwent thoracentesis on 05/21/2018 and the fluid appeared to be empyema.  It groups coag negative staph and actinomyces.  She was not felt to be a good candidate for thoracotomy and she had pigtail catheter placed on 05/27/2018 but it is still draining fluid.  The fluid appears more clear now than it did.  She has not had any fever.  She has had what appears to be increased problems with heart failure based on chest x-ray done yesterday.  She is on Lasix now.  She is not having any chest pain.  She denies any swelling of her legs.  No fever recently.  She is not coughing anything up.  No nausea vomiting diarrhea.  No headaches  Past Medical History:  Diagnosis Date  . Asthma   . COPD (chronic obstructive pulmonary disease) (HCC)    Oxygen dependent  . Coronary atherosclerosis of native coronary artery   . Essential hypertension, benign   . History of GI bleed    Ulcer  . Hyperlipidemia   . Old inferior wall myocardial infarction 1999   [facility]  . PAF (paroxysmal atrial fibrillation) (HCC)   . Sinus node dysfunction (HCC)    [employer] PPM  . Type 2 diabetes mellitus (HCC)      Family History  Problem Relation Age of Onset  . Diabetes Mother    . Diabetes Father   . Heart disease Father   . Cancer Sister        unknown  . Cancer Brother        unknown  . Diabetes Brother   . Colon cancer Neg Hx      Social History   Socioeconomic History  . Marital status: Widowed    Spouse name: Not on file  . Number of children: 2  . Years of education: Not on file  . Highest education level: Not on file  Occupational History  . Not on file  Social Needs  . Financial resource strain: Not on file  . Food insecurity:    Worry: Not on file    Inability: Not on file  . Transportation needs:    Medical: Not on file    Non-medical: Not on file  Tobacco Use  . Smoking status: Former Smoker    Packs/day: 2.00    Years: 40.00    Pack years: 80.00    Types: Cigarettes    Last attempt to quit: 01/13/1998    Years since quitting: 20.4  . Smokeless tobacco: Never Used  Substance and Sexual Activity  . Alcohol use: No    Alcohol/week: 0.0 standard drinks  . Drug use: No  . Sexual activity: Not on file  Lifestyle  . Physical activity:  Days per week: Not on file    Minutes per session: Not on file  . Stress: Not on file  Relationships  . Social connections:    Talks on phone: Not on file    Gets together: Not on file    Attends religious service: Not on file    Active member of club or organization: Not on file    Attends meetings of clubs or organizations: Not on file    Relationship status: Not on file  Other Topics Concern  . Not on file  Social History Narrative  . Not on file     ROS: Except as mentioned 10 point review of systems is negative    Objective: Vital signs in last 24 hours: Temp:  [97.5 F (36.4 C)-98.3 F (36.8 C)] 97.5 F (36.4 C) (08/19 0521) Pulse Rate:  [76-96] 92 (08/19 0521) Resp:  [16-20] 20 (08/19 0521) BP: (98-108)/(51-61) 98/51 (08/19 0521) SpO2:  [96 %-100 %] 100 % (08/19 0521) Weight change:  Last BM Date: 06/01/18  Intake/Output from previous day: 08/18 0701 - 08/19  0700 In: 613 [P.O.:600; I.V.:13] Out: 2660 [Urine:2650; Drains:10]  PHYSICAL EXAM Constitutional: She is awake and alert and in no acute distress.  Eyes: Pupils react EOMI.  Ears nose mouth and throat: Her mucous membranes are moist.  Her throat is clear.  Hearing seems to be mildly decreased.  Cardiovascular: She is in atrial fib with normal response.  I do not hear a gallop.  Respiratory: Her respiratory effort is normal.  Lungs are fairly clear.  She has a pigtail catheter in the left pleural space that seems to still be draining about 100 cc a day.  The fluid looks clear.  Gastrointestinal: Her abdomen is soft without masses.  Musculoskeletal: Normal strength upper and lower extremities bilaterally.  Psychiatric: She is anxious.  Neurological: No focal abnormalities  Lab Results: Basic Metabolic Panel: Recent Labs    06/05/18 0709 06/06/18 0623  NA 137 136  K 4.3 4.0  CL 93* 94*  CO2 35* 34*  GLUCOSE 92 96  BUN 29* 30*  CREATININE 0.83 0.84  CALCIUM 8.7* 8.5*  MG  --  2.1   Liver Function Tests: No results for input(s): AST, ALT, ALKPHOS, BILITOT, PROT, ALBUMIN in the last 72 hours. No results for input(s): LIPASE, AMYLASE in the last 72 hours. No results for input(s): AMMONIA in the last 72 hours. CBC: Recent Labs    06/05/18 0709 06/06/18 0623  WBC 9.3 8.2  HGB 9.0* 8.3*  HCT 28.8* 27.8*  MCV 90.9 91.1  PLT 251 224   Cardiac Enzymes: No results for input(s): CKTOTAL, CKMB, CKMBINDEX, TROPONINI in the last 72 hours. BNP: No results for input(s): PROBNP in the last 72 hours. D-Dimer: No results for input(s): DDIMER in the last 72 hours. CBG: No results for input(s): GLUCAP in the last 72 hours. Hemoglobin A1C: No results for input(s): HGBA1C in the last 72 hours. Fasting Lipid Panel: No results for input(s): CHOL, HDL, LDLCALC, TRIG, CHOLHDL, LDLDIRECT in the last 72 hours. Thyroid Function Tests: No results for input(s): TSH, T4TOTAL, FREET4, T3FREE, THYROIDAB  in the last 72 hours. Anemia Panel: No results for input(s): VITAMINB12, FOLATE, FERRITIN, TIBC, IRON, RETICCTPCT in the last 72 hours. Coagulation: No results for input(s): LABPROT, INR in the last 72 hours. Urine Drug Screen: Drugs of Abuse  No results found for: LABOPIA, COCAINSCRNUR, LABBENZ, AMPHETMU, THCU, LABBARB  Alcohol Level: No results for input(s): ETH in the last  72 hours. Urinalysis: No results for input(s): COLORURINE, LABSPEC, PHURINE, GLUCOSEU, HGBUR, BILIRUBINUR, KETONESUR, PROTEINUR, UROBILINOGEN, NITRITE, LEUKOCYTESUR in the last 72 hours.  Invalid input(s): APPERANCEUR Misc. Labs:   ABGS: No results for input(s): PHART, PO2ART, TCO2, HCO3 in the last 72 hours.  Invalid input(s): PCO2   MICROBIOLOGY: No results found for this or any previous visit (from the past 240 hour(s)).  Studies/Results: Dg Chest Port 1 View  Result Date: 06/07/2018 CLINICAL DATA:  Pulmonary edema. EXAM: PORTABLE CHEST 1 VIEW COMPARISON:  Eighteen 2019 and prior radiographs FINDINGS: Cardiomegaly, RIGHT PICC line with tip overlying the LOWER SVC, LEFT pacemaker and LEFT thoracostomy tube again noted. Decreased pulmonary edema identified with improved bilateral LOWER lung aeration. There is no evidence of pneumothorax. No acute bony abnormalities are present. IMPRESSION: Decreased pulmonary edema with improved bilateral LOWER lung aeration. Electronically Signed   By: Harmon Pier M.D.   On: 06/07/2018 08:26   Dg Chest Port 1 View  Result Date: 06/06/2018 CLINICAL DATA:  Pleural effusion EXAM: PORTABLE CHEST 1 VIEW COMPARISON:  06/04/2018 FINDINGS: Worsening bilateral airspace disease. Progressive bilateral effusions with bibasilar atelectasis. Dual lead pacemaker unchanged. Left pleural pigtail catheter remains in place in the base. No pneumothorax. Right sided PICC tip in the SVC unchanged. IMPRESSION: Worsening diffuse bilateral airspace disease and bilateral effusions consistent with heart  failure and edema. Pigtail chest tube on the left  without pneumothorax. Electronically Signed   By: Marlan Palau M.D.   On: 06/06/2018 08:39    Medications:  Prior to Admission:  Medications Prior to Admission  Medication Sig Dispense Refill Last Dose  . apixaban (ELIQUIS) 5 MG TABS tablet Take 1 tablet (5 mg total) by mouth 2 (two) times daily. 60 tablet 0 05/21/2018 at 0830  . bisoprolol (ZEBETA) 5 MG tablet Take 1 tablet (5 mg total) by mouth daily. Pt needs appointment for further refills. (Patient taking differently: Take 2.5 mg by mouth daily. Take one tablet by mouth in the morning and one at night.) 90 tablet 0 05/21/2018 at 0830  . furosemide (LASIX) 40 MG tablet Take 40 mg by mouth daily. Daily at 0800 am   05/20/2018 at Unknown time  . ipratropium-albuterol (DUONEB) 0.5-2.5 (3) MG/3ML SOLN Take 3 mLs by nebulization 3 (three) times daily. (Patient taking differently: Take 3 mLs by nebulization every 6 (six) hours as needed (for shortness of breath). ) 360 mL  Past Month at Unknown time  . LORazepam (ATIVAN) 0.5 MG tablet Take 1 tablet (0.5 mg total) by mouth daily. Take one tablet by mouth once daily (Patient taking differently: Take 0.5 mg by mouth at bedtime. Take one tablet by mouth once daily) 30 tablet 0 05/21/2018 at 1000  . Melatonin 10 MG TABS Take 1 tablet by mouth at bedtime.   05/20/2018 at Unknown time  . Multiple Vitamins-Minerals (CENTRUM SILVER 50+WOMEN PO) Take 1 tablet by mouth daily.   05/21/2018 at 0830  . nitroGLYCERIN (NITROSTAT) 0.4 MG SL tablet Place 1 tablet (0.4 mg total) under the tongue every 5 (five) minutes as needed for chest pain. 25 tablet 3 unknown  . omeprazole (PRILOSEC) 20 MG capsule Take 1 capsule (20 mg total) by mouth 2 (two) times daily before a meal. 60 capsule 11 05/21/2018 at 0830  . OXYGEN Inhale 2.5 L into the lungs continuous.   Taking  . potassium chloride (K-DUR,KLOR-CON) 10 MEQ tablet Take 10 mEq by mouth 2 (two) times daily.   05/21/2018 at 0830  .  vitamin  B-12 (CYANOCOBALAMIN) 1000 MCG tablet Take 1,000 mcg by mouth daily.   05/21/2018 at 0830  . escitalopram (LEXAPRO) 20 MG tablet Take 20 mg by mouth at bedtime.    on the way currently in mail order   Scheduled: . apixaban  5 mg Oral BID  . bisoprolol  2.5 mg Oral Daily  . chlorhexidine  15 mL Mouth Rinse BID  . famotidine  20 mg Oral Daily  . furosemide  40 mg Intravenous Q12H  . mouth rinse  15 mL Mouth Rinse q12n4p  . polyethylene glycol  17 g Oral Daily  . predniSONE  15 mg Oral Q breakfast  . senna-docusate  2 tablet Oral BID  . sodium chloride flush  10-40 mL Intracatheter Q12H  . sodium chloride flush  3 mL Intravenous Q12H   Continuous: . sodium chloride Stopped (05/29/18 1900)   WUJ:WJXBJYPRN:sodium chloride, acetaminophen, bisacodyl, ipratropium-albuterol, LORazepam, metoprolol tartrate, ondansetron (ZOFRAN) IV, sodium chloride flush, sodium chloride flush  Assesment: She had pneumonia complicated by empyema.  She has tube drainage in place but it still draining a significant amount of fluid.  The fluid appears to have cleared.  Less exudate.  She has COPD at baseline.  She has acute on chronic hypoxic and hypercapnic respiratory failure which seems stable.  She has acute on chronic combined systolic and diastolic heart failure which seems worse Active Problems:   Essential hypertension, benign   Pleural effusion on left   Atrial fibrillation with RVR (HCC)   Goals of care, counseling/discussion   Chronic respiratory failure with hypoxia and hypercapnia (HCC)   Acute on chronic combined systolic and diastolic CHF (congestive heart failure) (HCC)   Chronic respiratory failure with hypoxia (HCC)   Pressure injury of skin   Acute respiratory failure with hypoxia and hypercarbia (HCC)   Acute metabolic encephalopathy   Lobar pneumonia (HCC)   Pulmonary actinomycosis (HCC)   Empyema, left (HCC)    Plan: Check comprehensive metabolic profile basically to see what her  albumin is to see if that may be contributing to her continued pleural drainage.  Check culture of the fluid to see if she has cleared infection.  Continue treatment for heart failure.  I would get telephone consultation with infectious disease regarding the actinomyces in the pleural fluid    LOS: 17 days   Estela Vinal L 06/07/2018, 8:56 AM

## 2018-06-07 NOTE — Care Management Important Message (Signed)
Important Message  Patient Details  Name: Simone CuriaVirginia L Ing MRN: [id number] Date of Birth: 01/18/1931   Medicare Important Message Given:  Yes    Renie OraHawkins, Edilberto Roosevelt Smith 06/07/2018, 10:41 AM

## 2018-06-08 MED ORDER — PREDNISONE 10 MG PO TABS
10.0000 mg | ORAL_TABLET | Freq: Every day | ORAL | Status: DC
  Administered 2018-06-09: 10 mg via ORAL
  Filled 2018-06-08: qty 1

## 2018-06-08 NOTE — Progress Notes (Signed)
PROGRESS NOTE   Kathy CuriaVirginia L Page  UJW:[id number] DOB: 11/12/1930 DOA: 05/21/2018 PCP: Benita StabileHall, John Z, MD   Brief Narrative:  84 year old woman admitted from home on 8/2 with complaints of dyspnea.  She has a history of COPD, combined heart failure, hypertension, paroxysmal atrial fibrillation, sinus node dysfunction, diabetes and coronary artery disease.  She states her shortness of breath has been ongoing for about 1 month but has significantly worsened over the past 2 to 3 days prior to admission.  She visited the emergency department on July 13 and was discharged with prednisone and nebulizers for a presumed COPD exacerbation.  She then had a follow-up with her PCP who diagnosed her with pneumonia and started on antibiotics.  Because of no improvement she was referred to the emergency department for evaluation.  Chest x-ray showed a left pleural effusion, subsequently underwent thoracentesis on 8/2 which removed 300 cc of fluid which was obviously exudative with greater than 1000 WBCs and cultures growing coag negative staph and actinomyces.  Case was discussed with thoracic surgery who did not believe she wasn't adequate candidate for thoracotomy and has instead recommended IR consultation for placement of pigtail catheter which was performed on 8/8.  Assessment & Plan:   Active Problems:   Essential hypertension, benign   Pleural effusion on left   Atrial fibrillation with RVR (HCC)   Goals of care, counseling/discussion   Chronic respiratory failure with hypoxia and hypercapnia (HCC)   Acute on chronic combined systolic and diastolic CHF (congestive heart failure) (HCC)   Chronic respiratory failure with hypoxia (HCC)   Pressure injury of skin   Acute respiratory failure with hypoxia and hypercarbia (HCC)   Acute metabolic encephalopathy   Lobar pneumonia (HCC)   Pulmonary actinomycosis (HCC)   Empyema, left (HCC)  Acute on chronic respiratory failure with hypoxia and  hypercarbia -Multifactorial in the setting of hospital-acquired pneumonia, exudative pleural effusion, congestive heart failure and COPD. -She uses BiPAP at nighttime, continue. -Significantly improved.  Weaning steroids.  Continue scheduled duonebs.   Acute on chronic systolic Heart failure -currently on IV Lasix.  Repeat chest x-ray this morning shows improving pulmonary edema.  Net volume status is -6.5 L.  Continue current treatments resume oral lasix when IV doses completed.   Hospital-acquired pneumonia/exudative pleural effusion -With results of pleural fluid culture, antibiotics have been narrowed to doxycycline which should be sufficient coverage for both coag negative staph and actinomyces. Completed doxycycline 06/03/18. -Vancomycin discontinued as of 8/7.  Rocephin discontinued on 8/10. -Patient had chest tube placed by interventional radiology on 8/8. -Patient is still having significant drainage from chest tube, although it appears to be slowly improving -Plan from IR team is to keep tube in for 2 days after output decreases to less than 20 cc/24 hours. -Pulmonology following  Acute metabolic encephalopathy -Resolved and at baseline, daughter confirms. -Suspect due to hypercarbic respiratory failure as well as infectious process. -Continue BiPAP at bedtime.  Paroxysmal atrial fibrillation -Currently rate controlled. -Eliquis has been resumed after chest tube placement.  Monitoring for bleeding.    Sinus node dysfunction -Status post permanent pacemaker  Coronary artery disease -Stable, no chest pain -Continue bisoprolol.  Type 2 diabetes -stable and well controlled.   Acute on chronic kidney disease stage III -Baseline creatinine is around 1, creatinine peaked at 1.67. -Resolved and down to baseline of 0.82 on 8/7. Cr is 0.76 on 8/10.  Constipation -Resolved with enema  DVT prophylaxis: Eliquis Code Status: DNR  Family Communication: No family  present Disposition Plan: SNF pending medical stability. SW aware. [facility] Consultants:   IR  Pulmonology  Procedures:   Chest tube placement on 8/8  Antimicrobials:  Anti-infectives (From admission, onward)   Start     Dose/Rate Route Frequency Ordered Stop   05/29/18 1545  doxycycline (VIBRA-TABS) tablet 100 mg     100 mg Oral Every 12 hours 05/29/18 1530 06/03/18 1229   05/25/18 1500  cefTRIAXone (ROCEPHIN) 2 g in sodium chloride 0.9 % 100 mL IVPB  Status:  Discontinued     2 g 200 mL/hr over 30 Minutes Intravenous Every 24 hours 05/25/18 1345 05/29/18 1530   05/24/18 0900  vancomycin (VANCOCIN) IVPB 750 mg/150 ml premix  Status:  Discontinued     750 mg 150 mL/hr over 60 Minutes Intravenous Every 24 hours 05/23/18 0741 05/26/18 1350   05/24/18 0800  ceFEPIme (MAXIPIME) 1 g in sodium chloride 0.9 % 100 mL IVPB  Status:  Discontinued     1 g 200 mL/hr over 30 Minutes Intravenous Every 24 hours 05/23/18 0740 05/25/18 1345   05/23/18 0830  vancomycin (VANCOCIN) 1,500 mg in sodium chloride 0.9 % 500 mL IVPB     1,500 mg 250 mL/hr over 120 Minutes Intravenous  Once 05/23/18 0737 05/23/18 1212   05/23/18 0830  ceFEPIme (MAXIPIME) 2 g in sodium chloride 0.9 % 100 mL IVPB     2 g 200 mL/hr over 30 Minutes Intravenous  Once 05/23/18 0739 05/23/18 1100     Subjective: She had a good bowel movement yesterday after enema.  Feels that breathing is slowly improving  Objective: Vitals:   06/07/18 2230 06/08/18 0649 06/08/18 0650 06/08/18 1441  BP: (!) 100/57  119/86 (!) 93/55  Pulse: 81  (!) 106 80  Resp: 20  18 (!) 24  Temp: (!) 96.2 F (35.7 C)  97.6 F (36.4 C) 97.9 F (36.6 C)  TempSrc: Axillary  Oral Oral  SpO2: 99%  91% 97%  Weight:  65.2 kg    Height:        Intake/Output Summary (Last 24 hours) at 06/08/2018 1643 Last data filed at 06/08/2018 1500 Gross per 24 hour  Intake 720 ml  Output 2925 ml  Net -2205 ml   Filed Weights   06/04/18  0534 06/05/18 0622 06/08/18 0649  Weight: 69.6 kg 69.3 kg 65.2 kg   Examination:  General exam: Alert, awake, oriented x 3 Respiratory system: Crackles at bases. Respiratory effort normal.  Pigtail catheter in left chest Cardiovascular system:RRR. No murmurs, rubs, gallops. Gastrointestinal system: Abdomen is nondistended, soft and nontender. No organomegaly or masses felt. Normal bowel sounds heard. Central nervous system: Alert and oriented. No focal neurological deficits. Extremities: No C/C/E, +pedal pulses Skin: No rashes, lesions or ulcers Psychiatry: Judgement and insight appear normal. Mood & affect appropriate.    Data Reviewed: I have personally reviewed following labs and imaging studies  CBC: Recent Labs  Lab 06/05/18 0709 06/06/18 0623  WBC 9.3 8.2  HGB 9.0* 8.3*  HCT 28.8* 27.8*  MCV 90.9 91.1  PLT 251 224   Basic Metabolic Panel: Recent Labs  Lab 06/05/18 0709 06/06/18 0623 06/07/18 0921  NA 137 136 137  K 4.3 4.0 3.7  CL 93* 94* 89*  CO2 35* 34* 37*  GLUCOSE 92 96 158*  BUN 29* 30* 29*  CREATININE 0.83 0.84 0.89  CALCIUM 8.7* 8.5* 9.0  MG  --  2.1  --    GFR: Estimated  Creatinine Clearance: 39.9 mL/min (by C-G formula based on SCr of 0.89 mg/dL). Liver Function Tests: Recent Labs  Lab 06/07/18 0921  AST 22  ALT 48*  ALKPHOS 46  BILITOT 0.8  PROT 5.7*  ALBUMIN 3.2*   No results for input(s): LIPASE, AMYLASE in the last 168 hours. No results for input(s): AMMONIA in the last 168 hours. Coagulation Profile: No results for input(s): INR, PROTIME in the last 168 hours. Cardiac Enzymes: No results for input(s): CKTOTAL, CKMB, CKMBINDEX, TROPONINI in the last 168 hours. BNP (last 3 results) No results for input(s): PROBNP in the last 8760 hours. HbA1C: No results for input(s): HGBA1C in the last 72 hours. CBG: No results for input(s): GLUCAP in the last 168 hours. Lipid Profile: No results for input(s): CHOL, HDL, LDLCALC, TRIG, CHOLHDL,  LDLDIRECT in the last 72 hours. Thyroid Function Tests: No results for input(s): TSH, T4TOTAL, FREET4, T3FREE, THYROIDAB in the last 72 hours. Anemia Panel: No results for input(s): VITAMINB12, FOLATE, FERRITIN, TIBC, IRON, RETICCTPCT in the last 72 hours. Urine analysis:    Component Value Date/Time   COLORURINE YELLOW 05/23/2018 1250   APPEARANCEUR HAZY (A) 05/23/2018 1250   LABSPEC 1.018 05/23/2018 1250   PHURINE 5.0 05/23/2018 1250   GLUCOSEU NEGATIVE 05/23/2018 1250   HGBUR NEGATIVE 05/23/2018 1250   BILIRUBINUR NEGATIVE 05/23/2018 1250   KETONESUR 5 (A) 05/23/2018 1250   PROTEINUR 30 (A) 05/23/2018 1250   NITRITE NEGATIVE 05/23/2018 1250   LEUKOCYTESUR NEGATIVE 05/23/2018 1250   Recent Results (from the past 240 hour(s))  Body fluid culture     Status: None (Preliminary result)   Collection Time: 06/07/18  9:21 AM  Result Value Ref Range Status   Specimen Description   Final    PLEURAL CHEST TUBE Performed at [facility], [address], [place], [place] [address]    Special Requests   Final    Normal Performed at [facility], [address] [place], [place] [address]    Gram Stain NO WBC SEEN NO ORGANISMS SEEN   Final   Culture   Final    NO GROWTH < 24 HOURS Performed at [facility], [address], [place], [place] [address]    Report Status PENDING  Incomplete    Radiology Studies: Dg Chest Port 1 View  Result Date: 06/07/2018 CLINICAL DATA:  Pulmonary edema. EXAM: PORTABLE CHEST 1 VIEW COMPARISON:  Eighteen 2019 and prior radiographs FINDINGS: Cardiomegaly, RIGHT PICC line with tip overlying the LOWER SVC, LEFT pacemaker and LEFT thoracostomy tube again noted. Decreased pulmonary edema identified with improved bilateral LOWER lung aeration. There is no evidence of pneumothorax. No acute bony abnormalities are present. IMPRESSION: Decreased pulmonary edema with improved bilateral LOWER lung aeration. Electronically Signed   By: Harmon Pier M.D.   On:  06/07/2018 08:26   Scheduled Meds: . apixaban  5 mg Oral BID  . bisoprolol  2.5 mg Oral Daily  . chlorhexidine  15 mL Mouth Rinse BID  . famotidine  20 mg Oral Daily  . furosemide  40 mg Intravenous Q12H  . mouth rinse  15 mL Mouth Rinse q12n4p  . predniSONE  15 mg Oral Q breakfast  . senna-docusate  2 tablet Oral BID  . sodium chloride flush  10-40 mL Intracatheter Q12H  . sodium chloride flush  3 mL Intravenous Q12H   Continuous Infusions: . sodium chloride Stopped (05/29/18 1900)    LOS: 18 days   Time spent:  Erick Blinks, MD  [facility] Pager [phone]  If 7PM-7AM, please contact night-coverage [email or web address] Password [facility] 06/08/2018, 4:43 PM

## 2018-06-08 NOTE — Care Management Note (Signed)
Case Management Note  Patient Details  Name: [name] MRN: [id number] Date of Birth: 08/06/1931   If discussed at Long Length of Stay Meetings, dates discussed:  06/08/2018  Additional Comments:  Ponce Skillman, [name], RN 06/08/2018, 12:05 PM

## 2018-06-08 NOTE — Progress Notes (Signed)
PT Cancellation Note  Patient Details Name: [name] MRN: [id number] DOB: 12/19/1930   Cancelled Treatment:    Reason Eval/Treat Not Completed: Fatigue/lethargy limiting ability to participate.  Lethargic and unable to awaken her for mobility.  Will try again tomorrow.   Ivar DrapeRuth E Othello Sgroi 06/08/2018, 2:52 PM   Samul Dadauth Aryiana Klinkner, PT MS Acute Rehab Dept. Number: [facility] [phone] and [facility] [phone]

## 2018-06-08 NOTE — Progress Notes (Signed)
Subjective: She looks and feels better.  She seems to have slowed the output from her pleural catheter.  Objective: Vital signs in last 24 hours: Temp:  [96.2 F (35.7 C)-97.6 F (36.4 C)] 97.6 F (36.4 C) (08/20 0650) Pulse Rate:  [81-108] 106 (08/20 0650) Resp:  [16-20] 18 (08/20 0650) BP: (100-119)/(57-86) 119/86 (08/20 0650) SpO2:  [91 %-99 %] 91 % (08/20 0650) Weight:  [65.2 kg] 65.2 kg (08/20 0649) Weight change:  Last BM Date: 06/07/18  Intake/Output from previous day: 08/19 0701 - 08/20 0700 In: 720 [P.O.:720] Out: 1175 [Urine:1100; Drains:75]  PHYSICAL EXAM General appearance: alert, cooperative and no distress Resp: clear to auscultation bilaterally Cardio: irregularly irregular rhythm GI: soft, non-tender; bowel sounds normal; no masses,  no organomegaly Extremities: extremities normal, atraumatic, no cyanosis or edema  Lab Results:  Results for orders placed or performed during the hospital encounter of 05/21/18 (from the past 48 hour(s))  Body fluid culture     Status: None (Preliminary result)   Collection Time: 06/07/18  9:21 AM  Result Value Ref Range   Specimen Description      PLEURAL CHEST TUBE Performed at [facility], [address], [place], [place] [address]    Special Requests      Normal Performed at [facility], [address], [place], [place] [address]    Gram Stain NO WBC SEEN NO ORGANISMS SEEN     Culture      NO GROWTH < 24 HOURS Performed at [facility], [place] [address] [place], [place] [address]    Report Status PENDING   Comprehensive metabolic panel     Status: Abnormal   Collection Time: 06/07/18  9:21 AM  Result Value Ref Range   Sodium 137 135 - 145 mmol/L   Potassium 3.7 3.5 - 5.1 mmol/L   Chloride 89 (L) 98 - 111 mmol/L   CO2 37 (H) 22 - 32 mmol/L   Glucose, Bld 158 (H) 70 - 99 mg/dL   BUN 29 (H) 8 - 23 mg/dL   Creatinine, Ser 0.89 0.44 - 1.00 mg/dL   Calcium 9.0 8.9 - 10.3 mg/dL   Total Protein 5.7 (L) 6.5 -  8.1 g/dL   Albumin 3.2 (L) 3.5 - 5.0 g/dL   AST 22 15 - 41 U/L   ALT 48 (H) 0 - 44 U/L   Alkaline Phosphatase 46 38 - 126 U/L   Total Bilirubin 0.8 0.3 - 1.2 mg/dL   GFR calc non Af Amer 57 (L) >60 mL/min   GFR calc Af Amer >60 >60 mL/min    Comment: (NOTE) The eGFR has been calculated using the CKD EPI equation. This calculation has not been validated in all clinical situations. eGFR's persistently <60 mL/min signify possible Chronic Kidney Disease.    Anion gap 11 5 - 15    Comment: Performed at [facility], [address] [place], [place] [address]    ABGS No results for input(s): PHART, PO2ART, TCO2, HCO3 in the last 72 hours.  Invalid input(s): PCO2 CULTURES Recent Results (from the past 240 hour(s))  Body fluid culture     Status: None (Preliminary result)   Collection Time: 06/07/18  9:21 AM  Result Value Ref Range Status   Specimen Description   Final    PLEURAL CHEST TUBE Performed at [facility], [address] [place], [place] [address]    Special Requests   Final    Normal Performed at [facility], [address]  [place], [place] [address]    Gram Stain NO WBC SEEN NO ORGANISMS SEEN   Final   Culture   Final    NO GROWTH < 24 HOURS Performed at [facility], [place] [address], [place], [place] [address]    Report Status PENDING  Incomplete   Studies/Results: Dg Chest Port 1 View  Result Date: 06/07/2018 CLINICAL DATA:  Pulmonary edema. EXAM: PORTABLE CHEST 1 VIEW COMPARISON:  Eighteen 2019 and prior radiographs FINDINGS: Cardiomegaly, RIGHT PICC line with tip overlying the LOWER SVC, LEFT pacemaker and LEFT thoracostomy tube again noted. Decreased pulmonary edema identified with improved bilateral LOWER lung aeration. There is no evidence of pneumothorax. No acute bony abnormalities are present. IMPRESSION: Decreased pulmonary edema with improved bilateral LOWER lung aeration. Electronically Signed   By: Margarette Canada M.D.   On: 06/07/2018 08:26     Medications:  Prior to Admission:  Medications Prior to Admission  Medication Sig Dispense Refill Last Dose  . apixaban (ELIQUIS) 5 MG TABS tablet Take 1 tablet (5 mg total) by mouth 2 (two) times daily. 60 tablet 0 05/21/2018 at 0830  . bisoprolol (ZEBETA) 5 MG tablet Take 1 tablet (5 mg total) by mouth daily. Pt needs appointment for further refills. (Patient taking differently: Take 2.5 mg by mouth daily. Take one tablet by mouth in the morning and one at night.) 90 tablet 0 05/21/2018 at 0830  . furosemide (LASIX) 40 MG tablet Take 40 mg by mouth daily. Daily at 0800 am   05/20/2018 at Unknown time  . ipratropium-albuterol (DUONEB) 0.5-2.5 (3) MG/3ML SOLN Take 3 mLs by nebulization 3 (three) times daily. (Patient taking differently: Take 3 mLs by nebulization every 6 (six) hours as needed (for shortness of breath). ) 360 mL  Past Month at Unknown time  . LORazepam (ATIVAN) 0.5 MG tablet Take 1 tablet (0.5 mg total) by mouth daily. Take one tablet by mouth once daily (Patient taking differently: Take 0.5 mg by mouth at bedtime. Take one tablet by mouth once daily) 30 tablet 0 05/21/2018 at 1000  . Melatonin 10 MG TABS Take 1 tablet by mouth at bedtime.   05/20/2018 at Unknown time  . Multiple Vitamins-Minerals (CENTRUM SILVER 50+WOMEN PO) Take 1 tablet by mouth daily.   05/21/2018 at 0830  . nitroGLYCERIN (NITROSTAT) 0.4 MG SL tablet Place 1 tablet (0.4 mg total) under the tongue every 5 (five) minutes as needed for chest pain. 25 tablet 3 unknown  . omeprazole (PRILOSEC) 20 MG capsule Take 1 capsule (20 mg total) by mouth 2 (two) times daily before a meal. 60 capsule 11 05/21/2018 at 0830  . OXYGEN Inhale 2.5 L into the lungs continuous.   Taking  . potassium chloride (K-DUR,KLOR-CON) 10 MEQ tablet Take 10 mEq by mouth 2 (two) times daily.   05/21/2018 at 0830  . vitamin B-12 (CYANOCOBALAMIN) 1000 MCG tablet Take 1,000 mcg by mouth daily.   05/21/2018 at 0830  . escitalopram (LEXAPRO) 20 MG tablet Take 20 mg  by mouth at bedtime.    on the way currently in mail order   Scheduled: . apixaban  5 mg Oral BID  . bisoprolol  2.5 mg Oral Daily  . chlorhexidine  15 mL Mouth Rinse BID  . famotidine  20 mg Oral Daily  . furosemide  40 mg Intravenous Q12H  . mouth rinse  15 mL Mouth Rinse q12n4p  . predniSONE  15 mg Oral Q breakfast  . senna-docusate  2 tablet Oral BID  .  sodium chloride flush  10-40 mL Intracatheter Q12H  . sodium chloride flush  3 mL Intravenous Q12H   Continuous: . sodium chloride Stopped (05/29/18 1900)   YIA:[id number] chloride, acetaminophen, bisacodyl, ipratropium-albuterol, LORazepam, metoprolol tartrate, ondansetron (ZOFRAN) IV, sodium chloride flush, sodium chloride flush  Assesment: She has had empyema on the left.  Has a pleural catheter in place as she was not felt to be a good candidate for thoracotomy because of multiple medical problems.  She has had prolonged drainage from the Pleurx catheter but it seems to be slowing down a little bit now.  She has chronic hypoxic and hypercapnic respiratory failure which seems to be doing okay.  She has acute on chronic combined systolic and diastolic heart failure and that is probably part of the reason that she continues to have drainage from the catheter.  She had another culture done of the catheter drainage and we are awaiting that. Active Problems:   Essential hypertension, benign   Pleural effusion on left   Atrial fibrillation with RVR (HCC)   Goals of care, counseling/discussion   Chronic respiratory failure with hypoxia and hypercapnia (HCC)   Acute on chronic combined systolic and diastolic CHF (congestive heart failure) (HCC)   Chronic respiratory failure with hypoxia (HCC)   Pressure injury of skin   Acute respiratory failure with hypoxia and hypercarbia (HCC)   Acute metabolic encephalopathy   Lobar pneumonia (HCC)   Pulmonary actinomycosis (HCC)   Empyema, left ([place])    Plan: I would agree with plans for  skilled care facility post hospitalization.  Also agree to pull the catheter but not until her fluid drainage is less than it is now    LOS: 18 days   Joziah Dollins L 06/08/2018, 10:19 AM

## 2018-06-08 NOTE — Progress Notes (Addendum)
Patient ID: Kathy CuriaVirginia L Rickerson, female   DOB: 06/09/1931, 84 y.o.   MRN: [id number]  Left empyema drain placement in IR 05/27/2018  Have been following chart  Pt better per notes Appreciate Dr Juanetta GoslingHawkins following pt  Afeb WBC wnl CXR improving OP of drain less daily  Consider drain removal when OP is less than 10-20 cc daily

## 2018-06-09 MED ORDER — FUROSEMIDE 40 MG PO TABS
40.0000 mg | ORAL_TABLET | Freq: Two times a day (BID) | ORAL | Status: DC
  Administered 2018-06-09 – 2018-06-10 (×2): 40 mg via ORAL
  Filled 2018-06-09 (×2): qty 1

## 2018-06-09 NOTE — Progress Notes (Signed)
Subjective: She says she feels better.  No new complaints.  She had no drainage from her pigtail catheter last night.  Objective: Vital signs in last 24 hours: Temp:  [97.9 F (36.6 C)-98.3 F (36.8 C)] 97.9 F (36.6 C) (08/21 0643) Pulse Rate:  [80-98] 98 (08/21 0643) Resp:  [20-24] 20 (08/21 0643) BP: (93-110)/(55-66) 107/61 (08/21 0643) SpO2:  [97 %-99 %] 97 % (08/21 0643) Weight:  [64.5 kg] 64.5 kg (08/21 0700) Weight change: -0.7 kg Last BM Date: 06/08/18  Intake/Output from previous day: 08/20 0701 - 08/21 0700 In: 960 [P.O.:960] Out: 1750 [Urine:1750]  PHYSICAL EXAM General appearance: alert, cooperative and no distress Resp: clear to auscultation bilaterally Cardio: irregularly irregular rhythm GI: soft, non-tender; bowel sounds normal; no masses,  no organomegaly Extremities: extremities normal, atraumatic, no cyanosis or edema  Lab Results:  Results for orders placed or performed during the hospital encounter of 05/21/18 (from the past 48 hour(s))  Body fluid culture     Status: None (Preliminary result)   Collection Time: 06/07/18  9:21 AM  Result Value Ref Range   Specimen Description      PLEURAL CHEST TUBE Performed at [facility], [address], [place], [place] [address]    Special Requests      Normal Performed at [facility], [address], [place], [place] [address]    Gram Stain NO WBC SEEN NO ORGANISMS SEEN     Culture      NO GROWTH < 24 HOURS Performed at [facility], [place] [address], [place], [place] [address]    Report Status PENDING   Comprehensive metabolic panel     Status: Abnormal   Collection Time: 06/07/18  9:21 AM  Result Value Ref Range   Sodium 137 135 - 145 mmol/L   Potassium 3.7 3.5 - 5.1 mmol/L   Chloride 89 (L) 98 - 111 mmol/L   CO2 37 (H) 22 - 32 mmol/L   Glucose, Bld 158 (H) 70 - 99 mg/dL   BUN 29 (H) 8 - 23 mg/dL   Creatinine, Ser 0.89 0.44 - 1.00 mg/dL   Calcium 9.0 8.9 - 10.3 mg/dL   Total Protein 5.7  (L) 6.5 - 8.1 g/dL   Albumin 3.2 (L) 3.5 - 5.0 g/dL   AST 22 15 - 41 U/L   ALT 48 (H) 0 - 44 U/L   Alkaline Phosphatase 46 38 - 126 U/L   Total Bilirubin 0.8 0.3 - 1.2 mg/dL   GFR calc non Af Amer 57 (L) >60 mL/min   GFR calc Af Amer >60 >60 mL/min    Comment: (NOTE) The eGFR has been calculated using the CKD EPI equation. This calculation has not been validated in all clinical situations. eGFR's persistently <60 mL/min signify possible Chronic Kidney Disease.    Anion gap 11 5 - 15    Comment: Performed at [facility], [address] [place],  [address]    ABGS No results for input(s): PHART, PO2ART, TCO2, HCO3 in the last 72 hours.  Invalid input(s): PCO2 CULTURES Recent Results (from the past 240 hour(s))  Body fluid culture     Status: None (Preliminary result)   Collection Time: 06/07/18  9:21 AM  Result Value Ref Range Status   Specimen Description   Final    PLEURAL CHEST TUBE Performed at [facility], [address] [place],  [address]    Special Requests   Final    Normal Performed at [facility],  [address], [place], [place] [address]    Gram Stain NO WBC SEEN NO ORGANISMS SEEN   Final   Culture   Final    NO GROWTH < 24 HOURS Performed at [facility], [place] [address] [place], [place] [address]    Report Status PENDING  Incomplete   Studies/Results: No results found.  Medications:  Prior to Admission:  Medications Prior to Admission  Medication Sig Dispense Refill Last Dose  . apixaban (ELIQUIS) 5 MG TABS tablet Take 1 tablet (5 mg total) by mouth 2 (two) times daily. 60 tablet 0 05/21/2018 at 0830  . bisoprolol (ZEBETA) 5 MG tablet Take 1 tablet (5 mg total) by mouth daily. Pt needs appointment for further refills. (Patient taking differently: Take 2.5 mg by mouth daily. Take one tablet by mouth in the morning and one at night.) 90 tablet 0 05/21/2018 at 0830  . furosemide (LASIX) 40 MG tablet Take 40 mg by mouth daily. Daily at  0800 am   05/20/2018 at Unknown time  . ipratropium-albuterol (DUONEB) 0.5-2.5 (3) MG/3ML SOLN Take 3 mLs by nebulization 3 (three) times daily. (Patient taking differently: Take 3 mLs by nebulization every 6 (six) hours as needed (for shortness of breath). ) 360 mL  Past Month at Unknown time  . LORazepam (ATIVAN) 0.5 MG tablet Take 1 tablet (0.5 mg total) by mouth daily. Take one tablet by mouth once daily (Patient taking differently: Take 0.5 mg by mouth at bedtime. Take one tablet by mouth once daily) 30 tablet 0 05/21/2018 at 1000  . Melatonin 10 MG TABS Take 1 tablet by mouth at bedtime.   05/20/2018 at Unknown time  . Multiple Vitamins-Minerals (CENTRUM SILVER 50+WOMEN PO) Take 1 tablet by mouth daily.   05/21/2018 at 0830  . nitroGLYCERIN (NITROSTAT) 0.4 MG SL tablet Place 1 tablet (0.4 mg total) under the tongue every 5 (five) minutes as needed for chest pain. 25 tablet 3 unknown  . omeprazole (PRILOSEC) 20 MG capsule Take 1 capsule (20 mg total) by mouth 2 (two) times daily before a meal. 60 capsule 11 05/21/2018 at 0830  . OXYGEN Inhale 2.5 L into the lungs continuous.   Taking  . potassium chloride (K-DUR,KLOR-CON) 10 MEQ tablet Take 10 mEq by mouth 2 (two) times daily.   05/21/2018 at 0830  . vitamin B-12 (CYANOCOBALAMIN) 1000 MCG tablet Take 1,000 mcg by mouth daily.   05/21/2018 at 0830  . escitalopram (LEXAPRO) 20 MG tablet Take 20 mg by mouth at bedtime.    on the way currently in mail order   Scheduled: . apixaban  5 mg Oral BID  . bisoprolol  2.5 mg Oral Daily  . chlorhexidine  15 mL Mouth Rinse BID  . famotidine  20 mg Oral Daily  . furosemide  40 mg Intravenous Q12H  . mouth rinse  15 mL Mouth Rinse q12n4p  . polyethylene glycol  17 g Oral Daily  . predniSONE  10 mg Oral Q breakfast  . senna-docusate  2 tablet Oral BID  . sodium chloride flush  10-40 mL Intracatheter Q12H  . sodium chloride flush  3 mL Intravenous Q12H   Continuous: . sodium chloride Stopped (05/29/18 1900)    ZCH:YIFOYD chloride, acetaminophen, bisacodyl, ipratropium-albuterol, LORazepam, metoprolol tartrate, ondansetron (ZOFRAN) IV, sodium chloride flush, sodium chloride flush  Assesment: She had pneumonia and empyema of her left lung.  She is better and her drainage for now at least has stopped.  I do not think she  needs to be hooked to the Pleur-evac now and we could just took her to a drainage bag.  Discussed with her daughter and although she is at a point where we could potentially remove the tube we both feel like it safer to wait another 48 hours but she could go to the skilled care facility with the bag. Active Problems:   Essential hypertension, benign   Pleural effusion on left   Atrial fibrillation with RVR (HCC)   Goals of care, counseling/discussion   Chronic respiratory failure with hypoxia and hypercapnia (HCC)   Acute on chronic combined systolic and diastolic CHF (congestive heart failure) (HCC)   Chronic respiratory failure with hypoxia (HCC)   Pressure injury of skin   Acute respiratory failure with hypoxia and hypercarbia (HCC)   Acute metabolic encephalopathy   Lobar pneumonia (HCC)   Pulmonary actinomycosis (HCC)   Empyema, left ([place])    Plan: As above    LOS: 19 days   Adair Lemar L 06/09/2018, 8:56 AM

## 2018-06-09 NOTE — Clinical Social Work Note (Signed)
[facility]     D'Arcy Abraha, Juleen ChinaHeather D, LCSW

## 2018-06-09 NOTE — Progress Notes (Signed)
PROGRESS NOTE   [name]  ZOX:[id number] DOB: 07/21/1931 DOA: 05/21/2018 PCP: Benita StabileHall, John Z, MD   Brief Narrative:  84 year old woman admitted from home on 8/2 with complaints of dyspnea.  She has a history of COPD, combined heart failure, hypertension, paroxysmal atrial fibrillation, sinus node dysfunction, diabetes and coronary artery disease.  She states her shortness of breath has been ongoing for about 1 month but has significantly worsened over the past 2 to 3 days prior to admission.  She visited the emergency department on July 13 and was discharged with prednisone and nebulizers for a presumed COPD exacerbation.  She then had a follow-up with her PCP who diagnosed her with pneumonia and started on antibiotics.  Because of no improvement she was referred to the emergency department for evaluation.  Chest x-ray showed a left pleural effusion, subsequently underwent thoracentesis on 8/2 which removed 300 cc of fluid which was obviously exudative with greater than 1000 WBCs and cultures growing coag negative staph and actinomyces.  Case was discussed with thoracic surgery who did not believe she wasn't adequate candidate for thoracotomy and has instead recommended IR consultation for placement of pigtail catheter which was performed on 8/8.  Assessment & Plan:   Active Problems:   Essential hypertension, benign   Pleural effusion on left   Atrial fibrillation with RVR (HCC)   Goals of care, counseling/discussion   Chronic respiratory failure with hypoxia and hypercapnia (HCC)   Acute on chronic combined systolic and diastolic CHF (congestive heart failure) (HCC)   Chronic respiratory failure with hypoxia (HCC)   Pressure injury of skin   Acute respiratory failure with hypoxia and hypercarbia (HCC)   Acute metabolic encephalopathy   Lobar pneumonia (HCC)   Pulmonary actinomycosis (HCC)   Empyema, left (HCC)  Acute on chronic respiratory failure with hypoxia and  hypercarbia -Multifactorial in the setting of hospital-acquired pneumonia, exudative pleural effusion, congestive heart failure and COPD. -She uses BiPAP at nighttime, continue. -Significantly improved.  Steroids have been weaned off.  Continue scheduled duonebs.   Acute on chronic systolic Heart failure -currently on IV Lasix.  Repeat chest x-ray shows improving pulmonary edema.  Appears to be approaching euvolemia.  Change Lasix to 40 mg p.o. twice daily  Hospital-acquired pneumonia/exudative pleural effusion -With results of pleural fluid culture, antibiotics have been narrowed to doxycycline which should be sufficient coverage for both coag negative staph and actinomyces. Completed doxycycline 06/03/18. -Vancomycin discontinued as of 8/7.  Rocephin discontinued on 8/10. -Patient had chest tube placed by interventional radiology on 8/8. -Plan from IR team is to keep tube in for 2 days after output decreases to less than 20 cc/24 hours. -Pulmonology following -Overnight output from drain has significantly improved.  Per Dr. Juanetta GoslingHawkins, drain may be placed on end of 2.  She can likely discharge to nursing home with this drain.  Arrangements are being made with the nursing home to ensure that they can provide adequate care for this  Acute metabolic encephalopathy -Resolved and at baseline, daughter confirms. -Suspect due to hypercarbic respiratory failure as well as infectious process. -Continue BiPAP at bedtime.  Paroxysmal atrial fibrillation -Currently rate controlled. -Eliquis has been resumed after chest tube placement.  Monitoring for bleeding.    Sinus node dysfunction -Status post permanent pacemaker  Coronary artery disease -Stable, no chest pain -Continue bisoprolol.  Type 2 diabetes -stable and well controlled.   Acute on chronic kidney disease stage III -Baseline creatinine is around 1, creatinine peaked at 1.67. -Resolved  and down to baseline of 0.82 on 8/7. Cr is 0.76 on  8/10.  Constipation -Resolved with enema  DVT prophylaxis: Eliquis Code Status: DNR  Family Communication: No family present Disposition Plan: SNF pending medical stability. SW aware. Has bed offer from [facility]:   IR  Pulmonology  Procedures:   Chest tube placement on 8/8  Antimicrobials:  Anti-infectives (From admission, onward)   Start     Dose/Rate Route Frequency Ordered Stop   05/29/18 1545  doxycycline (VIBRA-TABS) tablet 100 mg     100 mg Oral Every 12 hours 05/29/18 1530 06/03/18 1229   05/25/18 1500  cefTRIAXone (ROCEPHIN) 2 g in sodium chloride 0.9 % 100 mL IVPB  Status:  Discontinued     2 g 200 mL/hr over 30 Minutes Intravenous Every 24 hours 05/25/18 1345 05/29/18 1530   05/24/18 0900  vancomycin (VANCOCIN) IVPB 750 mg/150 ml premix  Status:  Discontinued     750 mg 150 mL/hr over 60 Minutes Intravenous Every 24 hours 05/23/18 0741 05/26/18 1350   05/24/18 0800  ceFEPIme (MAXIPIME) 1 g in sodium chloride 0.9 % 100 mL IVPB  Status:  Discontinued     1 g 200 mL/hr over 30 Minutes Intravenous Every 24 hours 05/23/18 0740 05/25/18 1345   05/23/18 0830  vancomycin (VANCOCIN) 1,500 mg in sodium chloride 0.9 % 500 mL IVPB     1,500 mg 250 mL/hr over 120 Minutes Intravenous  Once 05/23/18 0737 05/23/18 1212   05/23/18 0830  ceFEPIme (MAXIPIME) 2 g in sodium chloride 0.9 % 100 mL IVPB     2 g 200 mL/hr over 30 Minutes Intravenous  Once 05/23/18 0739 05/23/18 1100     Subjective: Feeling better.  Denies any shortness of breath.  Objective: Vitals:   06/08/18 2127 06/09/18 0643 06/09/18 0700 06/09/18 1301  BP: 110/66 107/61  96/74  Pulse: 98 98  97  Resp: 20 20  16   Temp: 98.3 F (36.8 C) 97.9 F (36.6 C)  98.1 F (36.7 C)  TempSrc: Oral Oral  Oral  SpO2: 99% 97%  100%  Weight:   64.5 kg   Height:        Intake/Output Summary (Last 24 hours) at 06/09/2018 1817 Last data filed at 06/09/2018 1805 Gross per 24 hour  Intake 960 ml  Output  1000 ml  Net -40 ml   Filed Weights   06/05/18 0622 06/08/18 0649 06/09/18 0700  Weight: 69.3 kg 65.2 kg 64.5 kg   Examination:  General exam: Alert, awake, oriented x 3 Respiratory system: Crackles at bases. Respiratory effort normal.  Pigtail catheter in left chest Cardiovascular system:RRR. No murmurs, rubs, gallops. Gastrointestinal system: Abdomen is nondistended, soft and nontender. No organomegaly or masses felt. Normal bowel sounds heard. Central nervous system: Alert and oriented. No focal neurological deficits. Extremities: No C/C/E, +pedal pulses Skin: No rashes, lesions or ulcers Psychiatry: Judgement and insight appear normal. Mood & affect appropriate.     Data Reviewed: I have personally reviewed following labs and imaging studies  CBC: Recent Labs  Lab 06/05/18 0709 06/06/18 0623  WBC 9.3 8.2  HGB 9.0* 8.3*  HCT 28.8* 27.8*  MCV 90.9 91.1  PLT 251 224   Basic Metabolic Panel: Recent Labs  Lab 06/05/18 0709 06/06/18 0623 06/07/18 0921  NA 137 136 137  K 4.3 4.0 3.7  CL 93* 94* 89*  CO2 35* 34* 37*  GLUCOSE 92 96 158*  BUN 29* 30* 29*  CREATININE 0.83 0.84  0.89  CALCIUM 8.7* 8.5* 9.0  MG  --  2.1  --    GFR: Estimated Creatinine Clearance: 39.9 mL/min (by C-G formula based on SCr of 0.89 mg/dL). Liver Function Tests: Recent Labs  Lab 06/07/18 0921  AST 22  ALT 48*  ALKPHOS 46  BILITOT 0.8  PROT 5.7*  ALBUMIN 3.2*   No results for input(s): LIPASE, AMYLASE in the last 168 hours. No results for input(s): AMMONIA in the last 168 hours. Coagulation Profile: No results for input(s): INR, PROTIME in the last 168 hours. Cardiac Enzymes: No results for input(s): CKTOTAL, CKMB, CKMBINDEX, TROPONINI in the last 168 hours. BNP (last 3 results) No results for input(s): PROBNP in the last 8760 hours. HbA1C: No results for input(s): HGBA1C in the last 72 hours. CBG: No results for input(s): GLUCAP in the last 168 hours. Lipid Profile: No  results for input(s): CHOL, HDL, LDLCALC, TRIG, CHOLHDL, LDLDIRECT in the last 72 hours. Thyroid Function Tests: No results for input(s): TSH, T4TOTAL, FREET4, T3FREE, THYROIDAB in the last 72 hours. Anemia Panel: No results for input(s): VITAMINB12, FOLATE, FERRITIN, TIBC, IRON, RETICCTPCT in the last 72 hours. Urine analysis:    Component Value Date/Time   COLORURINE YELLOW 05/23/2018 1250   APPEARANCEUR HAZY (A) 05/23/2018 1250   LABSPEC 1.018 05/23/2018 1250   PHURINE 5.0 05/23/2018 1250   GLUCOSEU NEGATIVE 05/23/2018 1250   HGBUR NEGATIVE 05/23/2018 1250   BILIRUBINUR NEGATIVE 05/23/2018 1250   KETONESUR 5 (A) 05/23/2018 1250   PROTEINUR 30 (A) 05/23/2018 1250   NITRITE NEGATIVE 05/23/2018 1250   LEUKOCYTESUR NEGATIVE 05/23/2018 1250   Recent Results (from the past 240 hour(s))  Body fluid culture     Status: None (Preliminary result)   Collection Time: 06/07/18  9:21 AM  Result Value Ref Range Status   Specimen Description   Final    PLEURAL CHEST TUBE Performed at [facility], [address] [place], [place] [address]    Special Requests   Final    Normal Performed at [facility], [address] [place], [place] [address]    Gram Stain NO WBC SEEN NO ORGANISMS SEEN   Final   Culture   Final    NO GROWTH 2 DAYS Performed at [facility], [address] [place], [place] [address]    Report Status PENDING  Incomplete    Radiology Studies: No results found. Scheduled Meds: . apixaban  5 mg Oral BID  . bisoprolol  2.5 mg Oral Daily  . chlorhexidine  15 mL Mouth Rinse BID  . famotidine  20 mg Oral Daily  . furosemide  40 mg Intravenous Q12H  . mouth rinse  15 mL Mouth Rinse q12n4p  . predniSONE  10 mg Oral Q breakfast  . senna-docusate  2 tablet Oral BID  . sodium chloride flush  10-40 mL Intracatheter Q12H  . sodium chloride flush  3 mL Intravenous Q12H   Continuous Infusions: . sodium chloride Stopped (05/29/18 1900)    LOS: 19 days   Time  spent: 30mins  Erick BlinksJehanzeb Memon, MD [facility] Pager [phone]  If 7PM-7AM, please contact night-coverage [email or web address] Password [email or web address] 06/09/2018, 6:17 PM

## 2018-06-10 ENCOUNTER — Inpatient Hospital Stay
Admission: RE | Admit: 2018-06-10 | Discharge: 2018-06-14 | Disposition: A | Discharge status: Nursing Home (Includes ICF) | Payer: Medicare Other | Source: Ambulatory Visit | Attending: Internal Medicine | Admitting: Internal Medicine

## 2018-06-10 DIAGNOSIS — J869 Pyothorax without fistula: Secondary | ICD-10-CM | POA: Diagnosis not present

## 2018-06-10 DIAGNOSIS — I2510 Atherosclerotic heart disease of native coronary artery without angina pectoris: Secondary | ICD-10-CM | POA: Diagnosis not present

## 2018-06-10 DIAGNOSIS — Z87891 Personal history of nicotine dependence: Secondary | ICD-10-CM | POA: Diagnosis not present

## 2018-06-10 DIAGNOSIS — J90 Pleural effusion, not elsewhere classified: Secondary | ICD-10-CM | POA: Diagnosis not present

## 2018-06-10 DIAGNOSIS — I4891 Unspecified atrial fibrillation: Secondary | ICD-10-CM | POA: Diagnosis not present

## 2018-06-10 DIAGNOSIS — J9602 Acute respiratory failure with hypercapnia: Secondary | ICD-10-CM | POA: Diagnosis not present

## 2018-06-10 DIAGNOSIS — M6281 Muscle weakness (generalized): Secondary | ICD-10-CM | POA: Diagnosis not present

## 2018-06-10 DIAGNOSIS — R2689 Other abnormalities of gait and mobility: Secondary | ICD-10-CM | POA: Diagnosis not present

## 2018-06-10 DIAGNOSIS — J189 Pneumonia, unspecified organism: Secondary | ICD-10-CM | POA: Diagnosis not present

## 2018-06-10 DIAGNOSIS — Z9889 Other specified postprocedural states: Secondary | ICD-10-CM | POA: Diagnosis not present

## 2018-06-10 DIAGNOSIS — J9612 Chronic respiratory failure with hypercapnia: Secondary | ICD-10-CM | POA: Diagnosis not present

## 2018-06-10 DIAGNOSIS — E1129 Type 2 diabetes mellitus with other diabetic kidney complication: Secondary | ICD-10-CM | POA: Diagnosis not present

## 2018-06-10 DIAGNOSIS — E1159 Type 2 diabetes mellitus with other circulatory complications: Secondary | ICD-10-CM | POA: Diagnosis not present

## 2018-06-10 DIAGNOSIS — T859XXA Unspecified complication of internal prosthetic device, implant and graft, initial encounter: Secondary | ICD-10-CM | POA: Diagnosis not present

## 2018-06-10 DIAGNOSIS — E785 Hyperlipidemia, unspecified: Secondary | ICD-10-CM | POA: Diagnosis not present

## 2018-06-10 DIAGNOSIS — I130 Hypertensive heart and chronic kidney disease with heart failure and stage 1 through stage 4 chronic kidney disease, or unspecified chronic kidney disease: Secondary | ICD-10-CM | POA: Diagnosis not present

## 2018-06-10 DIAGNOSIS — I5043 Acute on chronic combined systolic (congestive) and diastolic (congestive) heart failure: Secondary | ICD-10-CM | POA: Diagnosis not present

## 2018-06-10 DIAGNOSIS — Z9981 Dependence on supplemental oxygen: Secondary | ICD-10-CM | POA: Diagnosis not present

## 2018-06-10 DIAGNOSIS — I481 Persistent atrial fibrillation: Secondary | ICD-10-CM | POA: Diagnosis not present

## 2018-06-10 DIAGNOSIS — J9601 Acute respiratory failure with hypoxia: Secondary | ICD-10-CM | POA: Diagnosis not present

## 2018-06-10 DIAGNOSIS — Z79899 Other long term (current) drug therapy: Secondary | ICD-10-CM | POA: Diagnosis not present

## 2018-06-10 DIAGNOSIS — R262 Difficulty in walking, not elsewhere classified: Secondary | ICD-10-CM | POA: Diagnosis not present

## 2018-06-10 DIAGNOSIS — R0600 Dyspnea, unspecified: Secondary | ICD-10-CM | POA: Diagnosis not present

## 2018-06-10 DIAGNOSIS — A420 Pulmonary actinomycosis: Secondary | ICD-10-CM | POA: Diagnosis not present

## 2018-06-10 DIAGNOSIS — I480 Paroxysmal atrial fibrillation: Secondary | ICD-10-CM | POA: Diagnosis not present

## 2018-06-10 DIAGNOSIS — J9611 Chronic respiratory failure with hypoxia: Secondary | ICD-10-CM | POA: Diagnosis not present

## 2018-06-10 DIAGNOSIS — J939 Pneumothorax, unspecified: Secondary | ICD-10-CM | POA: Diagnosis not present

## 2018-06-10 DIAGNOSIS — R0602 Shortness of breath: Secondary | ICD-10-CM | POA: Diagnosis not present

## 2018-06-10 DIAGNOSIS — J181 Lobar pneumonia, unspecified organism: Secondary | ICD-10-CM | POA: Diagnosis not present

## 2018-06-10 DIAGNOSIS — J441 Chronic obstructive pulmonary disease with (acute) exacerbation: Secondary | ICD-10-CM | POA: Diagnosis not present

## 2018-06-10 DIAGNOSIS — E119 Type 2 diabetes mellitus without complications: Secondary | ICD-10-CM | POA: Diagnosis not present

## 2018-06-10 DIAGNOSIS — Z4682 Encounter for fitting and adjustment of non-vascular catheter: Secondary | ICD-10-CM | POA: Diagnosis not present

## 2018-06-10 DIAGNOSIS — I10 Essential (primary) hypertension: Secondary | ICD-10-CM | POA: Diagnosis not present

## 2018-06-10 DIAGNOSIS — J438 Other emphysema: Secondary | ICD-10-CM | POA: Diagnosis not present

## 2018-06-10 DIAGNOSIS — Z7901 Long term (current) use of anticoagulants: Secondary | ICD-10-CM | POA: Diagnosis not present

## 2018-06-10 DIAGNOSIS — M47894 Other spondylosis, thoracic region: Secondary | ICD-10-CM | POA: Diagnosis not present

## 2018-06-10 DIAGNOSIS — Z950 Presence of cardiac pacemaker: Secondary | ICD-10-CM | POA: Diagnosis not present

## 2018-06-10 DIAGNOSIS — G9341 Metabolic encephalopathy: Secondary | ICD-10-CM | POA: Diagnosis not present

## 2018-06-10 DIAGNOSIS — J449 Chronic obstructive pulmonary disease, unspecified: Secondary | ICD-10-CM | POA: Diagnosis not present

## 2018-06-10 DIAGNOSIS — Z978 Presence of other specified devices: Secondary | ICD-10-CM | POA: Diagnosis not present

## 2018-06-10 DIAGNOSIS — J9383 Other pneumothorax: Secondary | ICD-10-CM | POA: Diagnosis not present

## 2018-06-10 LAB — BODY FLUID CULTURE
CULTURE: NO GROWTH
GRAM STAIN: NONE SEEN
Special Requests: NORMAL

## 2018-06-10 MED ORDER — LORAZEPAM 0.5 MG PO TABS: 0.5000 mg | ORAL_TABLET | Freq: Every day | ORAL | 0 refills | Status: DC

## 2018-06-10 MED ORDER — BISOPROLOL FUMARATE 5 MG PO TABS: 2.5000 mg | ORAL_TABLET | Freq: Every day | ORAL | Status: DC

## 2018-06-10 MED ORDER — FUROSEMIDE 40 MG PO TABS: 40.0000 mg | ORAL_TABLET | Freq: Two times a day (BID) | ORAL | Status: DC

## 2018-06-10 NOTE — Progress Notes (Signed)
Patient ID: Kathy CuriaVirginia L Misch, female   DOB: 11/30/1930, 84 y.o.   MRN: [id number]  IR following notes in chart Appreciate Dr Juanetta [facility]lingHawkins and [facility] Kerry HoughMemon following pt  Seems plan is to DC to SNF and change Pluervac to drain bag Wbc wnl Afeb OP minimal  Would remove chest tube when OP is less than 10-20 cc daily Call if need help with removal  [phone]- [place] [facility] (leave message if need)

## 2018-06-10 NOTE — Progress Notes (Signed)
She feels well.  She now has different drainage.  Her output from her chest tube has been minimal.  She is going to go to the skilled care facility with the tube in place for about 5 days to be sure that she does not start reaccumulating fluid when she is more active.  At that point I would have her come back [facility] for removal of the Pleur-evac catheter.  She has been on BiPAP here she says she is not sure if she really needs it but apparently they can take her with BiPAP at her skilled care facility so we could experiment with whether she actually needs it there.

## 2018-06-10 NOTE — Care Management Important Message (Signed)
Important Message  Patient Details  Name: [name] MRN: [id number] Date of Birth: 02/28/1931   Medicare Important Message Given:  Yes    Rose Hegner, [name], RN 06/10/2018, 2:03 PM

## 2018-06-10 NOTE — Discharge Summary (Signed)
Physician Discharge Summary  [name] WUJ:[id number] DOB: 04-20-1931 DOA: 05/21/2018  PCP: Benita Stabile, MD  Admit date: 05/21/2018 Discharge date: 06/10/2018  Admitted From: home Disposition:  SNF  Recommendations for Outpatient Follow-up:  1. Follow up with Dr. Juanetta Gosling in the next week 2. Please obtain BMP/CBC in one week 3. Continue BiPAP nightly via full facemask 14/6, with 3.5 L of oxygen bled in 4. Continue chest tube with drainage bag for the next 5 days.  When drainage is less than 10-20cc/day, would schedule the patient to return to radiology at [facility] for removal of Pleur-evac catheter  Discharge Condition: stable CODE STATUS: DNR Diet recommendation: heart healthy  Brief/Interim Summary: 84 year old woman admitted from home on 8/2 with complaints of dyspnea.  She has a history of COPD, combined heart failure, hypertension, paroxysmal atrial fibrillation, sinus node dysfunction, diabetes and coronary artery disease.  She states her shortness of breath has been ongoing for about 1 month but has significantly worsened over the past 2 to 3 days prior to admission.  She visited the emergency department on July 13 and was discharged with prednisone and nebulizers for a presumed COPD exacerbation.  She then had a follow-up with her PCP who diagnosed her with pneumonia and started on antibiotics.  Because of no improvement she was referred to the emergency department for evaluation.  Chest x-ray showed a left pleural effusion, subsequently underwent thoracentesis on 8/2 which removed 300 cc of fluid which was obviously exudative with greater than 1000 WBCs and cultures growing coag negative staph and actinomyces.  Case was discussed with thoracic surgery who did not believe she wasn't adequate candidate for thoracotomy and instead recommended IR consultation for placement of pigtail catheter which was performed on 8/8.  Since placement of catheter, she is clinically  improved.  Discharge Diagnoses:  Active Problems:   Essential hypertension, benign   Pleural effusion on left   Atrial fibrillation with RVR (HCC)   Goals of care, counseling/discussion   Chronic respiratory failure with hypoxia and hypercapnia (HCC)   Acute on chronic combined systolic and diastolic CHF (congestive heart failure) (HCC)   Chronic respiratory failure with hypoxia (HCC)   Pressure injury of skin   Acute respiratory failure with hypoxia and hypercarbia (HCC)   Acute metabolic encephalopathy   Lobar pneumonia (HCC)   Pulmonary actinomycosis (HCC)   Empyema, left (HCC)  1. Acute on chronic respiratory failure with hypoxia and hypercarbia.  Patient is chronically on 2 half liters of oxygen.  Her acute on chronic respiratory failure was felt to be multifactorial, related to pneumonia, congestive heart failure and COPD.  Her overall respiratory status has improved with treatment as currently approaching baseline.  She is continued on BiPAP nightly.  She will need to follow-up with Dr. Juanetta Gosling as an outpatient. 2. Acute on chronic combined CHF.  Recent echocardiogram done on 8/3 indicated ejection fraction of 45%.  Patient was treated with intravenous Lasix and overall volume status has improved.  Net volume status is -6.7 L.  Her home dose of Lasix 40 mg daily will be changed to twice daily.  She will need repeat chemistries in 1 week to ensure stability of renal function. 3. Hospital-acquired pneumonia with exudative pleural effusion.  Patient was treated with intravenous antibiotics, vancomycin and Rocephin.  She was subsequently transitioned to oral doxycycline and has completed her antibiotic course.  She underwent placement of pigtail catheter in her left chest by interventional radiology which significantly improved her symptoms.  Fluid analysis indicated no exudative process.  Cultures positive for coagulase-negative staph and actinomyces.  This should be adequately treated  antibiotics as listed above.  She has minimal output at this point from her catheter.  Drainage bag has been placed on the end of catheter.  When total daily drainage is noted to be less than 10 to 20cc, the catheter may be removed.  Would send the patient to radiology at any pain to have chest catheter removed. 4. Acute metabolic encephalopathy.  Due to hypercarbic respiratory failure as well as infectious process.  Patient was placed on BiPAP with improvement.  Mental status is back to baseline. 5. Paroxysmal atrial fibrillation.  Heart rate is controlled.  She is anticoagulated with Eliquis. 6. Sinus node dysfunction.  Status post permanent pacemaker 7. Coronary artery disease.  Stable.  No chest pain.  Continue on beta-blockers. 8. Diabetes.  Controlled. 9. Acute on chronic kidney disease stage III.  Creatinine peaked to 1.67, this is trended back down to baseline.  She will need repeat chemistries in 1 week to ensure stability  Discharge Instructions  Discharge Instructions    Diet - low sodium heart healthy   Complete by:  As directed    Increase activity slowly   Complete by:  As directed      Allergies as of 06/10/2018      Reactions   Codeine Nausea Only   Heparin Other (See Comments)   Causes internal bleeding       Medication List    TAKE these medications   apixaban 5 MG Tabs tablet Commonly known as:  ELIQUIS Take 1 tablet (5 mg total) by mouth 2 (two) times daily.   bisoprolol 5 MG tablet Commonly known as:  ZEBETA Take 0.5 tablets (2.5 mg total) by mouth daily. Pt needs appointment for further refills. What changed:  how much to take   CENTRUM SILVER 50+WOMEN PO Take 1 tablet by mouth daily.   escitalopram 20 MG tablet Commonly known as:  LEXAPRO Take 20 mg by mouth at bedtime.   furosemide 40 MG tablet Commonly known as:  LASIX Take 1 tablet (40 mg total) by mouth 2 (two) times daily. Daily at 0800 am What changed:  when to take this    ipratropium-albuterol 0.5-2.5 (3) MG/3ML Soln Commonly known as:  DUONEB Take 3 mLs by nebulization 3 (three) times daily. What changed:    when to take this  reasons to take this   LORazepam 0.5 MG tablet Commonly known as:  ATIVAN Take 1 tablet (0.5 mg total) by mouth at bedtime. Take one tablet by mouth once daily   Melatonin 10 MG Tabs Take 1 tablet by mouth at bedtime.   nitroGLYCERIN 0.4 MG SL tablet Commonly known as:  NITROSTAT Place 1 tablet (0.4 mg total) under the tongue every 5 (five) minutes as needed for chest pain.   omeprazole 20 MG capsule Commonly known as:  PRILOSEC Take 1 capsule (20 mg total) by mouth 2 (two) times daily before a meal.   OXYGEN Inhale 2.5 L into the lungs continuous.   potassium chloride 10 MEQ tablet Commonly known as:  K-DUR,KLOR-CON Take 10 mEq by mouth 2 (two) times daily.   vitamin B-12 1000 MCG tablet Commonly known as:  CYANOCOBALAMIN Take 1,000 mcg by mouth daily.      Contact information for after-discharge care    Destination    [facility] .   Service:  Skilled Nursing Contact information: [phone] [place] [address] [phone]             Allergies  Allergen Reactions  . Codeine Nausea Only  . Heparin Other (See Comments)    Causes internal bleeding     Consultations:  Pulmonology  Interventional radiology   Procedures/Studies: Dg Chest 1 View  Result Date: 06/04/2018 CLINICAL DATA:  Pleural effusion EXAM: CHEST  1 VIEW COMPARISON:  05/30/2018, 05/29/2018, CT 05/24/2018 FINDINGS: Left-sided pacing device as before. Right upper extremity catheter tip overlies the distal SVC. There are small bilateral pleural effusions. There is a left lower chest drainage catheter tip appears slightly more lateral as compared with 05/29/2018. There is decreased left pleural effusion. Stable enlarged cardiomediastinal silhouette with aortic atherosclerosis. No  pneumothorax is seen. IMPRESSION: 1. Left lung base drainage catheter remains in place. There appears to be decreased left effusion. 2. There are small pleural effusions bilaterally. 3. There is stable cardiomegaly. Electronically Signed   By: Jasmine Pang M.D.   On: 06/04/2018 16:34   Dg Chest 1 View  Result Date: 05/21/2018 CLINICAL DATA:  LEFT pleural effusion post thoracentesis EXAM: CHEST  1 VIEW COMPARISON:  Earlier exam of 05/21/2018 FINDINGS: Stable LEFT subclavian pacemaker and cardiac enlargement. Atherosclerotic calcification aorta. Persistent LEFT basilar opacity by atelectasis and effusion. No pneumothorax following thoracentesis. Remaining lungs clear. Bones demineralized. IMPRESSION: No pneumothorax following LEFT thoracentesis. Electronically Signed   By: Ulyses Southward M.D.   On: 05/21/2018 15:59   Dg Chest 1 View  Result Date: 05/21/2018 CLINICAL DATA:  Lateral radiograph requested.  Query pneumonia. EXAM: CHEST  1 VIEW COMPARISON:  AP radiograph done earlier. : The heart remains enlarged. Retrocardiac density likely effusion and atelectasis, no definite consolidation, is increased from 05/10/2018. IMPRESSION: Worsening aeration. Electronically Signed   By: Elsie Stain M.D.   On: 05/21/2018 12:48   Dg Chest 2 View  Result Date: 05/30/2018 CLINICAL DATA:  History of pleural effusions and status post pigtail drainage catheter placement to drain a left pleural effusion on 05/27/2018. EXAM: CHEST - 2 VIEW COMPARISON:  Portable chest x-ray on 05/29/2018 FINDINGS: The left basilar pleural drainage catheter is partially visualized on the frontal projection. In the lateral projection, good visualization posterior sulcus although left supports likely adequate pleural fluid drainage with no significant pleural fluid. There probably is some component a small amount of pleural fluid on the right. No overt edema present. No pneumothorax. Stable cardiac enlargement. Stable right upper extremity PICC  line positioning with the catheter tip in the distal SVC. Stable appearance of pacemaker. IMPRESSION: Based on the lateral chest x-ray, there likely is not a significant amount of residual left pleural fluid after pigtail catheter drainage. A small amount of pleural fluid is suspected on the right. No overt edema. Electronically Signed   By: Irish Lack M.D.   On: 05/30/2018 10:10   Ct Head Wo Contrast  Result Date: 05/22/2018 CLINICAL DATA:  84 y/o F; Altered level of consciousness (LOC), unexplained. EXAM: CT HEAD WITHOUT CONTRAST TECHNIQUE: Contiguous axial images were obtained from the base of the skull through the vertex without intravenous contrast. COMPARISON:  06/21/2016 CT head FINDINGS: Brain: No evidence of acute infarction, hemorrhage, hydrocephalus, extra-axial collection or mass lesion/mass effect. Mild chronic microvascular ischemic changes and volume loss of the brain for age. Vascular: Calcific atherosclerosis of carotid siphons. No hyperdense vessel identified. Skull: Normal. Negative for fracture or focal lesion. Sinuses/Orbits: No acute finding. Other: None. IMPRESSION: 1. No acute intracranial abnormality  identified. 2. Mild chronic microvascular ischemic changes and volume loss of the brain for age. Electronically Signed   By: Mitzi Hansen M.D.   On: 05/22/2018 23:45   Ct Chest Wo Contrast  Result Date: 05/24/2018 CLINICAL DATA:  Follow-up pleural effusion. EXAM: CT CHEST WITHOUT CONTRAST TECHNIQUE: Multidetector CT imaging of the chest was performed following the standard protocol without IV contrast. COMPARISON:  Chest x-ray dated 05/23/2018. Chest CT dated 08/31/2003. FINDINGS: Cardiovascular: Cardiomegaly. No pericardial effusion. Extensive aortic atherosclerosis. No thoracic aortic aneurysm. Three-vessel coronary artery calcifications. Mediastinum/Nodes: No mass or enlarged lymph nodes seen within the mediastinum or perihilar regions. Esophagus is unremarkable.  Trachea and central bronchi are unremarkable. Lungs/Pleura: Bilateral pleural effusions, small to moderate in size, RIGHT greater than LEFT. Chronic scarring/atelectasis at the LEFT lung base. Chronic pleural based nodule along the lateral margin of the RIGHT lung, near the junction of the major and minor fissures. No evidence of pneumonia. No pneumothorax. Upper Abdomen: No acute findings. Musculoskeletal: Degenerative changes throughout the slightly scoliotic thoracolumbar spine, mild to moderate in degree. No acute or suspicious osseous finding. Presumed chronic sebaceous cyst within the superficial soft tissues of the lower RIGHT back. Superficial soft tissues of the chest are otherwise unremarkable. IMPRESSION: 1. Bilateral pleural effusions, small to moderate in size, RIGHT greater than LEFT, with associated atelectasis. 2. No other acute appearing findings. 3. Cardiomegaly. 4. Diffuse coronary artery calcifications. Aortic Atherosclerosis (ICD10-I70.0). Electronically Signed   By: Bary Richard M.D.   On: 05/24/2018 13:43   US Venous Img Lower Bilateral  Result Date: 05/22/2018 CLINICAL DATA:  84 year old female with bilateral lower extremity pain and shortness of breath EXAM: BILATERAL LOWER EXTREMITY VENOUS DOPPLER ULTRASOUND TECHNIQUE: Gray-scale sonography with graded compression, as well as color Doppler and duplex ultrasound were performed to evaluate the lower extremity deep venous systems from the level of the common femoral vein and including the common femoral, femoral, profunda femoral, popliteal and calf veins including the posterior tibial, peroneal and gastrocnemius veins when visible. The superficial great saphenous vein was also interrogated. Spectral Doppler was utilized to evaluate flow at rest and with distal augmentation maneuvers in the common femoral, femoral and popliteal veins. COMPARISON:  None. FINDINGS: RIGHT LOWER EXTREMITY Common Femoral Vein: No evidence of thrombus. Normal  compressibility, respiratory phasicity and response to augmentation. Saphenofemoral Junction: No evidence of thrombus. Normal compressibility and flow on color Doppler imaging. Profunda Femoral Vein: No evidence of thrombus. Normal compressibility and flow on color Doppler imaging. Femoral Vein: No evidence of thrombus. Normal compressibility, respiratory phasicity and response to augmentation. Popliteal Vein: No evidence of thrombus. Normal compressibility, respiratory phasicity and response to augmentation. Calf Veins: No evidence of thrombus. Normal compressibility and flow on color Doppler imaging. Superficial Great Saphenous Vein: No evidence of thrombus. Normal compressibility. Venous Reflux:  None. Other Findings:  None. LEFT LOWER EXTREMITY Common Femoral Vein: No evidence of thrombus. Normal compressibility, respiratory phasicity and response to augmentation. Saphenofemoral Junction: No evidence of thrombus. Normal compressibility and flow on color Doppler imaging. Profunda Femoral Vein: No evidence of thrombus. Normal compressibility and flow on color Doppler imaging. Femoral Vein: No evidence of thrombus. Normal compressibility, respiratory phasicity and response to augmentation. Popliteal Vein: No evidence of thrombus. Normal compressibility, respiratory phasicity and response to augmentation. Calf Veins: No evidence of thrombus. Normal compressibility and flow on color Doppler imaging. Superficial Great Saphenous Vein: No evidence of thrombus. Normal compressibility. Venous Reflux:  None. Other Findings:  None. IMPRESSION: 1. No evidence of deep  venous thrombosis in either lower extremity. 2. Increased pulsatility of the venous waveforms bilaterally. Similar findings can be seen in the setting of elevated right heart pressures. Common differential considerations include tricuspid regurgitation, right-sided heart failure, pulmonary arterial hypertension and long-standing COPD. Electronically Signed   By:  Malachy Moan M.D.   On: 05/22/2018 12:45   Dg Chest Port 1 View  Result Date: 06/07/2018 CLINICAL DATA:  Pulmonary edema. EXAM: PORTABLE CHEST 1 VIEW COMPARISON:  Eighteen 2019 and prior radiographs FINDINGS: Cardiomegaly, RIGHT PICC line with tip overlying the LOWER SVC, LEFT pacemaker and LEFT thoracostomy tube again noted. Decreased pulmonary edema identified with improved bilateral LOWER lung aeration. There is no evidence of pneumothorax. No acute bony abnormalities are present. IMPRESSION: Decreased pulmonary edema with improved bilateral LOWER lung aeration. Electronically Signed   By: Harmon Pier M.D.   On: 06/07/2018 08:26   Dg Chest Port 1 View  Result Date: 06/06/2018 CLINICAL DATA:  Pleural effusion EXAM: PORTABLE CHEST 1 VIEW COMPARISON:  06/04/2018 FINDINGS: Worsening bilateral airspace disease. Progressive bilateral effusions with bibasilar atelectasis. Dual lead pacemaker unchanged. Left pleural pigtail catheter remains in place in the base. No pneumothorax. Right sided PICC tip in the SVC unchanged. IMPRESSION: Worsening diffuse bilateral airspace disease and bilateral effusions consistent with heart failure and edema. Pigtail chest tube on the left  without pneumothorax. Electronically Signed   By: Marlan Palau M.D.   On: 06/06/2018 08:39   Dg Chest Port 1 View  Result Date: 05/29/2018 CLINICAL DATA:  Follow-up chest x-ray.  Pleural effusion. EXAM: PORTABLE CHEST 1 VIEW COMPARISON:  May 23, 2018 FINDINGS: Stable cardiomegaly. Bilateral pleural effusions with underlying opacities, worsened in the interval. Stable right PICC line. No other changes. IMPRESSION: Increasing bilateral pleural effusions with underlying opacities. Electronically Signed   By: Gerome Sam III M.D   On: 05/29/2018 08:17   Dg Chest Port 1 View  Result Date: 05/23/2018 CLINICAL DATA:  Status post PICC line placement EXAM: PORTABLE CHEST 1 VIEW COMPARISON:  05/23/2018 FINDINGS: Cardiac shadow is  stable. Pacing device is again seen. New right-sided PICC line is noted at the cavoatrial junction. Bilateral pleural effusions are noted. IMPRESSION: PICC line in satisfactory position. The remainder of the exam is stable. Electronically Signed   By: Alcide Clever M.D.   On: 05/23/2018 12:04   Dg Chest Port 1 View  Result Date: 05/23/2018 CLINICAL DATA:  Hypoxia. EXAM: PORTABLE CHEST 1 VIEW COMPARISON:  Radiographs 2 days ago 05/21/2018 FINDINGS: Left-sided pacemaker remains in place. Increasing [place] bibasilar opacity likely combination of pleural fluid and atelectasis. The heart is enlarged. Unchanged mediastinal contours from prior exam. Increasing vascular congestion. No pneumothorax. IMPRESSION: Increasing hazy opacity at the lung bases, left greater than right, likely pleural fluid and adjacent atelectasis. Increasing vascular congestion. Electronically Signed   By: Rubye Oaks M.D.   On: 05/23/2018 06:52   Dg Chest Portable 1 View  Result Date: 05/21/2018 CLINICAL DATA:  Short of breath and weakness. EXAM: PORTABLE CHEST 1 VIEW COMPARISON:  05/10/2018 FINDINGS: Cardiac enlargement with dual lead pacemaker. Negative for heart failure. Mild left lower lobe airspace disease and probable small left effusion unchanged from the prior study. Right lung remains clear. IMPRESSION: Mild left lower lobe airspace disease and probable left effusion unchanged. This area is difficult to evaluate on portable image given the large cardiac silhouette. Electronically Signed   By: Marlan Palau M.D.   On: 05/21/2018 11:45   Ir Perc Pleural Drain W/indwell Cath  W/img Guide  Result Date: 05/27/2018 CLINICAL DATA:  Left empyema with residual effusion. Drainage requested. EXAM: INSERTION OF  PLEURAL DRAINAGE CATHETER ANESTHESIA/SEDATION: Intravenous Fentanyl and Versed were administered as conscious sedation during continuous monitoring of the patient's level of consciousness and physiological / cardiorespiratory status  by the radiology RN, with a total moderate sedation time of 6 minutes. MEDICATIONS: No periprocedural antibiotics indicated FLUOROSCOPY TIME:  6 seconds; 1 mGy PROCEDURE: The procedure, risks, benefits, and alternatives were explained to the patient. Questions regarding the procedure were encouraged and answered. The patient understands and consents to the procedure. Under ultrasound, an appropriate skin entry site to approach the left pleural effusion was localized and marked. The left chest wall was prepped with Betadine in a sterile fashion, and a sterile drape was applied covering the operative field. A sterile gown and sterile gloves were used for the procedure. Local anesthesia was provided with 1% Lidocaine. Ultrasound image documentation was performed. Fluoroscopy during the procedure and fluoroscopic spot radiograph confirms appropriate catheter position. After creating a small skin incision, a 19 gauge needle was advanced into the pleural cavity under ultrasound guidance. A guide wire was then advanced under fluoroscopy into the pleural space. Guidewire position confirmed under fluoroscopy. Pleural access was dilated serially and a 12 French pigtail catheter was placed. Catheter was connected to externally [place] Pleur-evac device. Catheter was secured externally with 0 Prolene suture and covered with Vaseline gauze and sterile gauze dressing. Fluoroscopic spot image confirms good catheter position. The patient tolerated the procedure well. COMPLICATIONS: None. FINDINGS: Residual pleural effusion at the left lung base was confirmed with ultrasound. 12 French pigtail drain catheter placed as above. IMPRESSION: 1. Technically successful left pleural drain placement. Electronically Signed   By: Corlis Leak  Hassell M.D.   On: 05/27/2018 16:18   [place] Thoracentesis Asp Pleural Space W/img Guide  Result Date: 05/21/2018 INDICATION: LEFT pleural effusion EXAM: ULTRASOUND GUIDED DIAGNOSTIC LEFT THORACENTESIS MEDICATIONS:  None. COMPLICATIONS: None immediate. PROCEDURE: Procedure, benefits, and risks of procedure were discussed with patient. Written informed consent for procedure was obtained. Time out protocol followed. Pleural effusion localized by ultrasound at the posterior LEFT hemithorax. Skin prepped and draped in usual sterile fashion. Skin and soft tissues anesthetized with 10 ML of 1% lidocaine. 8 French thoracentesis catheter placed into the LEFT pleural space. 300 mL of clear serous fluid aspirated by syringe pump. Procedure tolerated well by patient without immediate complication. FINDINGS: A total of approximately 300 mL of LEFT pleural fluid was removed. Samples were sent to the laboratory as requested by the clinical team. IMPRESSION: Successful ultrasound guided LEFT thoracentesis yielding 300 mL of pleural fluid. Electronically Signed   By: Ulyses SouthwardMark  Boles M.D.   On: 05/21/2018 15:57       Subjective: Feeling better.  No shortness of breath or cough.  Discharge Exam: Vitals:   06/09/18 2148 06/10/18 0600  BP: 104/73 100/60  Pulse: 96   Resp: 18 16  Temp: 98.6 F (37 C) 98 F (36.7 C)  SpO2: 97% 96%   Vitals:   06/09/18 2148 06/09/18 2237 06/10/18 0400 06/10/18 0600  BP: 104/73   100/60  Pulse: 96     Resp: 18   16  Temp: 98.6 F (37 C)   98 F (36.7 C)  TempSrc: Oral     SpO2: 97%   96%  Weight:  64.5 kg 64.5 kg   Height:        General: Pt is alert, awake, not in acute distress Cardiovascular:  RRR, S1/S2 +, no rubs, no gallops Respiratory: CTA bilaterally, no wheezing, no rhonchi.  Pigtail catheter from the left chest. Abdominal: Soft, NT, ND, bowel sounds + Extremities: no edema, no cyanosis    The results of significant diagnostics from this hospitalization (including imaging, microbiology, ancillary and laboratory) are listed below for reference.     Microbiology: Recent Results (from the past 240 hour(s))  Body fluid culture     Status: None (Preliminary result)    Collection Time: 06/07/18  9:21 AM  Result Value Ref Range Status   Specimen Description   Final    PLEURAL CHEST TUBE Performed at [facility], [address] [place], [place] [address]    Special Requests   Final    Normal Performed at [facility], [address] [place], [place] [address]    Gram Stain NO WBC SEEN NO ORGANISMS SEEN   Final   Culture   Final    NO GROWTH 3 DAYS Performed at [facility], [address], [place], [place] [address]    Report Status PENDING  Incomplete     Labs: BNP (last 3 results) Recent Labs    05/21/18 1129 05/23/18 0600  BNP 787.0* 960.0*   Basic Metabolic Panel: Recent Labs  Lab 06/05/18 0709 06/06/18 0623 06/07/18 0921  NA 137 136 137  K 4.3 4.0 3.7  CL 93* 94* 89*  CO2 35* 34* 37*  GLUCOSE 92 96 158*  BUN 29* 30* 29*  CREATININE 0.83 0.84 0.89  CALCIUM 8.7* 8.5* 9.0  MG  --  2.1  --    Liver Function Tests: Recent Labs  Lab 06/07/18 0921  AST 22  ALT 48*  ALKPHOS 46  BILITOT 0.8  PROT 5.7*  ALBUMIN 3.2*   No results for input(s): LIPASE, AMYLASE in the last 168 hours. No results for input(s): AMMONIA in the last 168 hours. CBC: Recent Labs  Lab 06/05/18 0709 06/06/18 0623  WBC 9.3 8.2  HGB 9.0* 8.3*  HCT 28.8* 27.8*  MCV 90.9 91.1  PLT 251 224   Cardiac Enzymes: No results for input(s): CKTOTAL, CKMB, CKMBINDEX, TROPONINI in the last 168 hours. BNP: Invalid input(s): POCBNP CBG: No results for input(s): GLUCAP in the last 168 hours. D-Dimer No results for input(s): DDIMER in the last 72 hours. Hgb A1c No results for input(s): HGBA1C in the last 72 hours. Lipid Profile No results for input(s): CHOL, HDL, LDLCALC, TRIG, CHOLHDL, LDLDIRECT in the last 72 hours. Thyroid function studies No results for input(s): TSH, T4TOTAL, T3FREE, THYROIDAB in the last 72 hours.  Invalid input(s): FREET3 Anemia work up No results for input(s): VITAMINB12, FOLATE, FERRITIN, TIBC, IRON, RETICCTPCT in the  last 72 hours. Urinalysis    Component Value Date/Time   COLORURINE YELLOW 05/23/2018 1250   APPEARANCEUR HAZY (A) 05/23/2018 1250   LABSPEC 1.018 05/23/2018 1250   PHURINE 5.0 05/23/2018 1250   GLUCOSEU NEGATIVE 05/23/2018 1250   HGBUR NEGATIVE 05/23/2018 1250   BILIRUBINUR NEGATIVE 05/23/2018 1250   KETONESUR 5 (A) 05/23/2018 1250   PROTEINUR 30 (A) 05/23/2018 1250   NITRITE NEGATIVE 05/23/2018 1250   LEUKOCYTESUR NEGATIVE 05/23/2018 1250   Sepsis Labs Invalid input(s): PROCALCITONIN,  WBC,  LACTICIDVEN Microbiology Recent Results (from the past 240 hour(s))  Body fluid culture     Status: None (Preliminary result)   Collection Time: 06/07/18  9:21 AM  Result Value Ref Range Status   Specimen Description   Final    PLEURAL CHEST  TUBE Performed at [facility], [address] [place], [place] [address]    Special Requests   Final    Normal Performed at [facility], [address], [place], [place] [address]    Gram Stain NO WBC SEEN NO ORGANISMS SEEN   Final   Culture   Final    NO GROWTH 3 DAYS Performed at [facility], [address] [place], [place] [address]    Report Status PENDING  Incomplete     Time coordinating discharge:  SIGNED:   Erick Blinks, MD  [facility] 06/10/2018, 2:24 PM Pager   If 7PM-7AM, please contact night-coverage [email or web address] Password [email or web address]

## 2018-06-10 NOTE — Care Management Note (Signed)
Case Management Note  Patient Details  Name: [name] MRN: [id number] Date of Birth: 04/01/1931 If discussed at Long Length of Stay Meetings, dates discussed:   06/10/2018 Additional Comments:  [name], [name], RN 06/10/2018, 12:17 PM

## 2018-06-10 NOTE — Progress Notes (Signed)
Initial Nutrition Assessment  DOCUMENTATION CODES:      INTERVENTION:  Heart Healthy diet   Offer snacks q hs as desired from nourishment room   NUTRITION DIAGNOSIS:   Increased nutrient needs related to chronic illness, acute illness as evidenced by estimated needs, mild muscle depletion.  GOAL:   Patient will meet greater than or equal to 90% of their needs  MONITOR:   PO intake, Weight trends, Labs  REASON FOR ASSESSMENT:   LOS    ASSESSMENT:  Patient admitted on 8/2 complaining of shortness of breath. Patient has PNA, left PE (pigtail drain) and acute on chronic CHF.  She has a hx of COPD, CHF, DM and CAD (pacemaker). Patient plans to discharge to SNF when medically ready. Patient meal intake 50-75% of most documented meals and she is complimentary of our food. Able to feed herself. Patient says she was living independently prior to acute illness.  Patient wt in June 68.5 kg -current 64.5 kg shows desired wt loss. Her usual weight has been ranging between 66-69 kg most of the past 2 years. Patient has not been weighing herself at home.  Physical exam shows mild muscle loss -likely related to acute on chronic illnesses, prolonged hospitalization, age and limited mobility.    I/O last 3 completed shifts: In: 960 [P.O.:960] Out: 1400 [Urine:1400]    Labs: BMP Latest Ref Rng & Units 06/07/2018 06/06/2018 06/05/2018  Glucose 70 - 99 mg/dL 161(W) 96 92  BUN 8 - 23 mg/dL 96(E) 45(W) 09(W)  Creatinine 0.44 - 1.00 mg/dL 1.19 1.47 8.29  Sodium 135 - 145 mmol/L 137 136 137  Potassium 3.5 - 5.1 mmol/L 3.7 4.0 4.3  Chloride 98 - 111 mmol/L 89(L) 94(L) 93(L)  CO2 22 - 32 mmol/L 37(H) 34(H) 35(H)  Calcium 8.9 - 10.3 mg/dL 9.0 5.6(O) 1.3(Y)   Medications reviewed and include: lasix, senna, pepcid, eliquis     NUTRITION - FOCUSED PHYSICAL EXAM:    Most Recent Value  Orbital Region  Mild depletion  Upper Arm Region  No depletion  Thoracic and Lumbar Region  No depletion   Buccal Region  Mild depletion  Temple Region  Mild depletion  Clavicle Bone Region  Mild depletion  Dorsal Hand  No depletion  Patellar Region  Moderate depletion  Anterior Thigh Region  Mild depletion  Posterior Calf Region  Mild depletion  Edema (RD Assessment)  None  Hair  Reviewed  Eyes  Reviewed  Mouth  Reviewed  Skin  Reviewed  Nails  Reviewed      Diet Order:   Diet Order            Diet - low sodium heart healthy        Diet Heart Room service appropriate? Yes; Fluid consistency: Thin  Diet effective now              EDUCATION NEEDS:   Education needs have been addressed(briefly- pt is being discharged to SNF )    Skin:  Skin Assessment: Reviewed RN Assessment(MASD and Stage I documented on admission to sacrum).  She has a pigtail drain (IR placed) which is to be taken out 3-4 days.   Last BM:  8/20  Height:   Ht Readings from Last 1 Encounters:  05/21/18 5\' 4"  (1.626 m)    Weight:   Wt Readings from Last 1 Encounters:  06/10/18 64.5 kg    Ideal Body Weight:  55 kg  BMI:  Body mass index is 24.41 kg/m.  Estimated Nutritional Needs:   Kcal:  1430-1560 kcal (22-24 kcal/kg/bw)  Protein:  78-85 gr  (1.2-1.3 gr/kg/bw)  Fluid:  1.4-1.5 liters daily    Royann ShiversLynn Markesha Hannig MS,RD,CSG,LDN Office: [phone] Pager: [phone]

## 2018-06-10 NOTE — Clinical Social Work Placement (Signed)
   CLINICAL SOCIAL WORK PLACEMENT  NOTE  Date:  06/10/2018  Patient Details  Name: [name] MRN: [id number] Date of Birth: 05/22/1931  Clinical Social Work is seeking post-discharge placement for this patient at the Skilled  Nursing Facility level of care (*CSW will initial, date and re-position this form in  chart as items are completed):  Yes   Patient/family provided with [facility] Clinical Social Work Department's list of facilities offering this level of care within the geographic area requested by the patient (or if unable, by the patient's family).  Yes   Patient/family informed of their freedom to choose among providers that offer the needed level of care, that participate in Medicare, Medicaid or managed care program needed by the patient, have an available bed and are willing to accept the patient.  Yes   Patient/family informed of [facility]'s ownership interest in [facility] and [facility], as well as of the fact that they are under no obligation to receive care at these facilities.  PASRR submitted to EDS on       PASRR number received on       Existing PASRR number confirmed on 06/02/18     FL2 transmitted to all facilities in geographic area requested by pt/family on 06/02/18     FL2 transmitted to all facilities within larger geographic area on       Patient informed that his/her managed care company has contracts with or will negotiate with certain facilities, including the following:        Yes   Patient/family informed of bed offers received.  [facility]     Physician recommends and patient chooses bed at      [facility] on 06/10/18.  Patient to be transferred to facility by wheelchair     Patient family notified on 06/10/18 of transfer.  Name of family member notified:  patient notified family     PHYSICIAN       Additional Comment: Pt stable for [facility]  per MD. [facility] pt today. Spoke with pt who remains in agreement with the plan. Updated RN who will call report and transport. There are no other CSW needs for dc.   _______________________________________________ Elliot GaultKathleen Ceri Mayer, LCSW 06/10/2018, 2:20 PM

## 2018-06-10 NOTE — Progress Notes (Signed)
Patient is to be discharged to [facility]. Patient and family made aware of discharge. Patient to be [facility] by staff via wheelchair.   Quita SkyeMorgan P Dishmon, RN

## 2018-06-11 ENCOUNTER — Encounter (HOSPITAL_COMMUNITY)
Admission: RE | Admit: 2018-06-11 | Discharge: 2018-06-11 | Disposition: A | Discharge status: Home / Self Care | Payer: Medicare Other | Source: Skilled Nursing Facility | Attending: Internal Medicine | Admitting: Internal Medicine

## 2018-06-11 ENCOUNTER — Non-Acute Institutional Stay (SKILLED_NURSING_FACILITY): Payer: Medicare Other | Admitting: Internal Medicine

## 2018-06-11 DIAGNOSIS — I4819 Other persistent atrial fibrillation: Secondary | ICD-10-CM

## 2018-06-11 DIAGNOSIS — E785 Hyperlipidemia, unspecified: Secondary | ICD-10-CM | POA: Insufficient documentation

## 2018-06-11 DIAGNOSIS — J9612 Chronic respiratory failure with hypercapnia: Secondary | ICD-10-CM

## 2018-06-11 DIAGNOSIS — J9611 Chronic respiratory failure with hypoxia: Secondary | ICD-10-CM

## 2018-06-11 DIAGNOSIS — I110 Hypertensive heart disease with heart failure: Secondary | ICD-10-CM | POA: Insufficient documentation

## 2018-06-11 DIAGNOSIS — E1159 Type 2 diabetes mellitus with other circulatory complications: Secondary | ICD-10-CM

## 2018-06-11 DIAGNOSIS — J181 Lobar pneumonia, unspecified organism: Secondary | ICD-10-CM | POA: Diagnosis not present

## 2018-06-11 DIAGNOSIS — I10 Essential (primary) hypertension: Secondary | ICD-10-CM | POA: Insufficient documentation

## 2018-06-11 DIAGNOSIS — I481 Persistent atrial fibrillation: Secondary | ICD-10-CM | POA: Diagnosis not present

## 2018-06-11 DIAGNOSIS — A079 Protozoal intestinal disease, unspecified: Secondary | ICD-10-CM | POA: Insufficient documentation

## 2018-06-11 DIAGNOSIS — I5043 Acute on chronic combined systolic (congestive) and diastolic (congestive) heart failure: Secondary | ICD-10-CM

## 2018-06-11 DIAGNOSIS — E1165 Type 2 diabetes mellitus with hyperglycemia: Secondary | ICD-10-CM | POA: Insufficient documentation

## 2018-06-11 LAB — BASIC METABOLIC PANEL
Anion gap: 9 (ref 5–15)
BUN: 29 mg/dL — ABNORMAL HIGH (ref 8–23)
CO2: 34 mmol/L — ABNORMAL HIGH (ref 22–32)
Calcium: 8.6 mg/dL — ABNORMAL LOW (ref 8.9–10.3)
Chloride: 93 mmol/L — ABNORMAL LOW (ref 98–111)
Creatinine, Ser: 1.03 mg/dL — ABNORMAL HIGH (ref 0.44–1.00)
GFR calc Af Amer: 56 mL/min — ABNORMAL LOW (ref >60)
GFR, EST NON AFRICAN AMERICAN: 48 mL/min — AB (ref >60)
Glucose, Bld: 150 mg/dL — ABNORMAL HIGH (ref 70–99)
POTASSIUM: 3.9 mmol/L (ref 3.5–5.1)
SODIUM: 136 mmol/L (ref 135–145)

## 2018-06-11 LAB — CBC WITH DIFFERENTIAL/PLATELET
Basophils Absolute: 0 10*3/uL (ref 0.0–0.1)
Basophils Relative: 0 %
EOS PCT: 1 %
Eosinophils Absolute: 0.1 10*3/uL (ref 0.0–0.7)
HCT: 33.2 % — ABNORMAL LOW (ref 36.0–46.0)
Hemoglobin: 9.9 g/dL — ABNORMAL LOW (ref 12.0–15.0)
LYMPHS PCT: 13 %
Lymphs Abs: 1.3 10*3/uL (ref 0.7–4.0)
MCH: 26.4 pg (ref 26.0–34.0)
MCHC: 29.8 g/dL — ABNORMAL LOW (ref 30.0–36.0)
MCV: 88.5 fL (ref 78.0–100.0)
Monocytes Absolute: 0.9 10*3/uL (ref 0.1–1.0)
Monocytes Relative: 10 %
NEUTROS PCT: 76 %
Neutro Abs: 7.3 10*3/uL (ref 1.7–7.7)
PLATELETS: 205 10*3/uL (ref 150–400)
RBC: 3.75 MIL/uL — AB (ref 3.87–5.11)
RDW: 13.8 % (ref 11.5–15.5)
WBC: 9.5 10*3/uL (ref 4.0–10.5)

## 2018-06-11 NOTE — Progress Notes (Signed)
Acute visit.  Level of care is skilled.  Facility is [facility].   Chief complaint-acute visit status post hospitalization for acute respiratory failure with hypoxia complicated with acute on chronic combined CHF.  As well as pneumonia  History of present illness.  Patient is a pleasant 84 year old female who is being admitted to skilled nursing after hospitalization for respiratory failure.  This was thought to be multifactorial.  In [place] to pneumonia as well as CHF and COPD exacerbation.  She is chronically on oxygen.  She is continued on BiPAP nightly.  She continues on routine DuoNeb's as well.  Regards to CHF recent echo on August 3 showed an ejection fraction of 45%.  She was treated with IV Lasix which is been transitioned to oral Lasix 40 mg twice daily.  She also was diagnosed with pneumonia with exudative pleural effusion- treated with IV antibiotics vancomycin and Rocephin and transition to oral doxycycline which she has completed.  She also underwent placement of a catheter in her left chest by interventional radiology which improved her symptoms  Fluid analysis did not show any exudative process- cultures were positive for coagulase-negative staph and actinomyces--.  She now has minimal output from her catheter.  Recommendation when total discharge is less than 10 to 20 cm to remove catheter  Apparently on admission she had confusion thought due to hypercarbic respiratory failure and infection-again she did do well with the BiPAP and mental status appears to be back at baseline.  Her other medical issues appear to be stable she does have a history of atrial fibrillation controlled on a beta-blocker she is on Eliquis for anticoagulation.  She also has a history of a pacemaker with sinus node dysfunction.  Her diabetes apparently is diet controlled  She also has chronic kidney disease stage III which appears to be stable during hospitalization did peak  at 1.67- it is 1.03 on lab done today which is closer to her baseline  Currently she is resting in bed comfortably appears to have fairly minimal drainage from her tube- vital signs appear to be stable she does not really have any complaints  Past Medical History:  Diagnosis Date  . Asthma   . COPD (chronic obstructive pulmonary disease) (HCC)    Oxygen dependent  . Coronary atherosclerosis of native coronary artery   . Essential hypertension, benign   . History of GI bleed    Ulcer  . Hyperlipidemia   . Old inferior wall myocardial infarction 1999   [facility]  . PAF (paroxysmal atrial fibrillation) (HCC)   . Sinus node dysfunction (HCC)    [employer] PPM  . Type 2 diabetes mellitus (HCC)         Past Surgical History:  Procedure Laterality Date  . Cesareen section    . CHOLECYSTECTOMY    . COLONOSCOPY  09/2004   RMR: For obscure GI bleed. Left-sided diverticula, otherwise normal ileocolonoscopy  . Coronary artery stent  1999   BMS x 3 RCA  ([facility]), PTCRA RC4  . EP IMPLANTABLE DEVICE N/A 06/26/2016   Procedure: Pacemaker Implant;  Surgeon: Will Jorja LoaMartin Camnitz, MD;  Location: [facility] INVASIVE CV LAB;  Service: Cardiovascular;  Laterality: N/A;  . ESOPHAGOGASTRODUODENOSCOPY  11 2005   RMR: For melena, normal exam.  . EXPLORATORY LAPAROTOMY    . GIVENS CAPSULE STUDY  08/2004   official report unavailable, but reported couple of AVMs, nonbleeding  . PARTIAL COLECTOMY  1977   blockage/gangrene? unclear if small bowel or colon  .  THORACENTESIS Left 03/10/16   1 L transudative fluid   Social History:  reports that she quit smoking about 20 years ago. Her smoking use included cigarettes. She has a 80.00 pack-year smoking history. She has never used smokeless tobacco. She reports that she does not drink alcohol or use drugs.        Family History  Problem Relation Age of Onset  . Diabetes Mother   . Diabetes Father   . Heart disease Father   . Cancer  Sister        unknown  . Cancer Brother        unknown  . Diabetes Brother   . Colon cancer Neg Hx           Allergies  Allergen Reactions  . Codeine Nausea Only  . Heparin Other (See Comments)    Causes internal bleeding      Medication List      apixaban 5 MG Tabs tablet Commonly known as:  ELIQUIS Take 1 tablet (5 mg total) by mouth 2 (two) times daily.   bisoprolol 5 MG tablet Commonly known as:  ZEBETA Take 0.5 tablets (2.5 mg total) by mouth daily. Pt needs appointment for further refills. What changed:  how much to take   CENTRUM SILVER 50+WOMEN PO Take 1 tablet by mouth daily.   escitalopram 20 MG tablet Commonly known as:  LEXAPRO Take 20 mg by mouth at bedtime.   furosemide 40 MG tablet Commonly known as:  LASIX Take 1 tablet (40 mg total) by mouth 2 (two) times daily. Daily at 0800 am What changed:  when to take this   ipratropium-albuterol 0.5-2.5 (3) MG/3ML Soln Commonly known as:  DUONEB Take 3 mLs by nebulization 3 (three) times daily. What changed:    when to take this  reasons to take this   LORazepam 0.5 MG tablet Commonly known as:  ATIVAN Take 1 tablet (0.5 mg total) by mouth at bedtime. Take one tablet by mouth once daily   Melatonin 10 MG Tabs Take 1 tablet by mouth at bedtime.   nitroGLYCERIN 0.4 MG SL tablet Commonly known as:  NITROSTAT Place 1 tablet (0.4 mg total) under the tongue every 5 (five) minutes as needed for chest pain.   omeprazole 20 MG capsule Commonly known as:  PRILOSEC Take 1 capsule (20 mg total) by mouth 2 (two) times daily before a meal.   OXYGEN Inhale 2.5 L into the lungs continuous.   potassium chloride 10 MEQ tablet Commonly known as:  K-DUR,KLOR-CON Take 10 mEq by mouth 2 (two) times daily.   vitamin B-12 1000 MCG tablet Commonly known as:  CYANOCOBALAMIN Take 1,000 mcg by mouth daily.         Contact information for after-discharge care        Destination      [facility] .   Service:  Skilled Nursing Contact information: [address] [place] [address] [phone]                   Allergies  Allergen Reactions  . Codeine Nausea Only  . Heparin Other (See Comments)    Causes internal bleeding     Consultations:  Pulmonology  Interventional radiology   Review of systems.  General she is not complaining of fever chills feels she is doing well  Skin does not complain of rashes itching or diaphoresis.  Head ears eyes nose mouth and throat is not complaining  of visual changes or sore throat.  Respiratory denies any increased shortness of breath beyond baseline or cough.  Cardiac is not complaining of chest pain or lower extremity edema.  GI is not complaining of abdominal discomfort nausea vomiting diarrhea or current GU does not complain of dysuria.  Musculoskeletal is not complaining of joint pain at this time.  Neurologic does not complain of being dizzy or having a headache or feeling syncopal.  Psych does not complain of depression or anxiety does have a history of anxiety has Ativan as needed at night--continues on Lexapro with a history of depression   Physical exam.  Temperature is 98.2 pulse 72 respirations 18 blood pressure 128/66- O2 saturation is 95% on chronic oxygen   In general this is a pleasant elderly female in no distress lying comfortably in bed.  Her skin is warm and dry-.  Oropharynx is clear mucous membranes moist.  Eyes visual acuity appears to be intact sclera and conjunctive are clear.  Chest no labored breathing-she does have some slight expiratory wheezing-there is shallow air entry  Heart is irregular irregular rate and rhythm without murmur gallop or rub she does not have significant lower extremity edema  Abdomen is soft nontender with positive bowel sounds.  Musculoskeletal bed but appears able to move all  extremities at baseline.  Limited exam since she is in bed  Neurologic is grossly intact her speech is clear no lateralizing findings  Psych she is alert and oriented pleasant and appropriate.  Labs.  June 11, 2018.  Sodium 136 potassium 3.9 BUN 29 creatinine 1.03-CO2 level was 34.  WBC 9.5 hemoglobin 9.9 platelets 205.  Assessment and plan.  #1 history of acute on chronic respiratory failure again this was thought to be multifactorial.  Secondary to pneumonia and CHF with COPD exacerbation   2-r combined CHF with ejection fraction of 45%- he is on Lasix 40 mg a day- continue to monitor weights and clinical status at this point appears to be stable- she continues on a beta-blocker as well.  She will need an updated metabolic panel next week.  Regards to her left pleural effusion she continues with catheter again drainage is minimal with orders when drainage is 10 to 20 cm to remove catheter  2 history of pneumonia she has completed antibiotic therapy-at this point appears to be stable--regards to left pleural effusion please see above.  3.-History of COPD she continues on chronic oxygen as well as BiPAP she does have duo nebs 3 times daily.  4.-History of atrial fibrillation she continues on Eliquis as well as beta-blocker at this point appears to be rate controlled.  5 history of sinus node dysfunction she does have a history of a pacemaker.  6 history of type 2 diabetes diet-controlled BG so far have been stable-this morning was 159.  7.-History of coronary artery disease at this point appears stable she continues on a beta-blocker as well as Nitrostat as needed.  8.-  History of depression this appears stable on Lexapro.  9.  History of anxiety she continues on Ativan at bedtime.  She also continues on for insomnia.  10.  History of chronic kidney disease this appears stable with a creatinine of 1.03 BUN today's lab again will warrant updating next week especially  since she is on a diuretic  #11- history of hypokalemia apparently this was addressed in the hospital has normalized she is on potassium supplementation again will update lab next week potassium was 3.9 on today's lab  [id number]- of note greater than 40 minutes spent assessing patient-reviewing her chart and labs- and coordinating and formulating a plan of care for numerous diagnoses- of note greater than 50% of time spent coordinating a plan of care

## 2018-06-13 ENCOUNTER — Encounter: Payer: Self-pay | Admitting: Internal Medicine

## 2018-06-14 ENCOUNTER — Other Ambulatory Visit: Payer: Self-pay

## 2018-06-14 ENCOUNTER — Emergency Department (HOSPITAL_COMMUNITY)
Admission: EM | Admit: 2018-06-14 | Discharge: 2018-06-14 | Disposition: A | Discharge status: Home / Self Care | Payer: Medicare Other | Attending: Emergency Medicine | Admitting: Emergency Medicine

## 2018-06-14 ENCOUNTER — Non-Acute Institutional Stay (SKILLED_NURSING_FACILITY): Payer: Medicare Other | Admitting: Internal Medicine

## 2018-06-14 ENCOUNTER — Encounter: Payer: Self-pay | Admitting: Internal Medicine

## 2018-06-14 ENCOUNTER — Encounter (HOSPITAL_COMMUNITY): Payer: Self-pay | Admitting: Emergency Medicine

## 2018-06-14 ENCOUNTER — Inpatient Hospital Stay
Admission: RE | Admit: 2018-06-14 | Discharge: 2018-06-30 | Disposition: A | Discharge status: Home / Self Care | Payer: Medicare Other | Source: Ambulatory Visit | Attending: Internal Medicine | Admitting: Internal Medicine

## 2018-06-14 ENCOUNTER — Emergency Department (HOSPITAL_COMMUNITY): Payer: Medicare Other

## 2018-06-14 DIAGNOSIS — J449 Chronic obstructive pulmonary disease, unspecified: Secondary | ICD-10-CM | POA: Diagnosis not present

## 2018-06-14 DIAGNOSIS — Z87891 Personal history of nicotine dependence: Secondary | ICD-10-CM | POA: Insufficient documentation

## 2018-06-14 DIAGNOSIS — Z7901 Long term (current) use of anticoagulants: Secondary | ICD-10-CM | POA: Diagnosis not present

## 2018-06-14 DIAGNOSIS — I481 Persistent atrial fibrillation: Secondary | ICD-10-CM

## 2018-06-14 DIAGNOSIS — E119 Type 2 diabetes mellitus without complications: Secondary | ICD-10-CM | POA: Insufficient documentation

## 2018-06-14 DIAGNOSIS — J438 Other emphysema: Secondary | ICD-10-CM | POA: Diagnosis not present

## 2018-06-14 DIAGNOSIS — Z79899 Other long term (current) drug therapy: Secondary | ICD-10-CM | POA: Insufficient documentation

## 2018-06-14 DIAGNOSIS — I10 Essential (primary) hypertension: Secondary | ICD-10-CM | POA: Insufficient documentation

## 2018-06-14 DIAGNOSIS — J9383 Other pneumothorax: Secondary | ICD-10-CM | POA: Insufficient documentation

## 2018-06-14 DIAGNOSIS — R0602 Shortness of breath: Secondary | ICD-10-CM | POA: Diagnosis not present

## 2018-06-14 DIAGNOSIS — J90 Pleural effusion, not elsewhere classified: Secondary | ICD-10-CM

## 2018-06-14 DIAGNOSIS — T859XXA Unspecified complication of internal prosthetic device, implant and graft, initial encounter: Secondary | ICD-10-CM

## 2018-06-14 DIAGNOSIS — I5043 Acute on chronic combined systolic (congestive) and diastolic (congestive) heart failure: Secondary | ICD-10-CM | POA: Diagnosis not present

## 2018-06-14 DIAGNOSIS — Z4682 Encounter for fitting and adjustment of non-vascular catheter: Secondary | ICD-10-CM

## 2018-06-14 DIAGNOSIS — I4819 Other persistent atrial fibrillation: Secondary | ICD-10-CM

## 2018-06-14 DIAGNOSIS — Z9889 Other specified postprocedural states: Principal | ICD-10-CM

## 2018-06-14 MED ORDER — LORAZEPAM 0.5 MG PO TABS: 0.5000 mg | ORAL_TABLET | Freq: Every day | ORAL | 0 refills | Status: AC

## 2018-06-14 NOTE — ED Triage Notes (Signed)
Pt brought in from [facility] via wheelchair after pt accidentally removed "chest tube". No respiratory distress noted. Pt alert and oriented.

## 2018-06-14 NOTE — ED Notes (Addendum)
Per EDP Dr. Estell HarpinZammit and general surgeon Dr. Lovell SheehanJenkins. Patient's drainage tube has been reconnected and hooked up to low suction in closed drainage system. Tape has been used to secure tubing connection. Straw colored drainage was observed draining.

## 2018-06-14 NOTE — ED Notes (Addendum)
Report called to [facility]. 

## 2018-06-14 NOTE — ED Provider Notes (Signed)
[facility] EMERGENCY DEPARTMENT Provider Note   CSN: [id number] Arrival date & time: 06/14/18  1318     History   Chief Complaint Chief Complaint  Patient presents with  . chest tube replacement    H[name] is a 84 y.o. female.  Patient has a chest tube in her left chest that was helping with drainage from pneumonia.  She return to the emergency department today because staff at the nursing home felt like the tube had moved.  Patient has no complaints  The history is provided by the patient and the nursing home. No language interpreter was used.  Illness  This is a new problem. The current episode started more than 2 days ago. The problem occurs constantly. The problem has not changed since onset.Pertinent negatives include no chest pain, no abdominal pain and no headaches. Nothing aggravates the symptoms. Nothing relieves the symptoms.    Past Medical History:  Diagnosis Date  . Asthma   . COPD (chronic obstructive pulmonary disease) (HCC)    Oxygen dependent  . Coronary atherosclerosis of native coronary artery   . Essential hypertension, benign   . History of GI bleed    Ulcer  . Hyperlipidemia   . Old inferior wall myocardial infarction 1999   [facility]  . PAF (paroxysmal atrial fibrillation) (HCC)   . Sinus node dysfunction (HCC)    [employer] PPM  . Type 2 diabetes mellitus [facility])     Patient Active Problem List   Diagnosis Date Noted  . Pulmonary actinomycosis (HCC) 05/25/2018  . Empyema, left (HCC)   . Pressure injury of skin 05/23/2018  . Acute respiratory failure with hypoxia and hypercarbia (HCC) 05/23/2018  . Acute metabolic encephalopathy 05/23/2018  . Lobar pneumonia (HCC) 05/23/2018  . Acute on chronic combined systolic and diastolic CHF (congestive heart failure) (HCC) 05/21/2018  . Chronic respiratory failure with hypoxia (HCC) 05/21/2018  . UTI (urinary tract infection) 06/25/2016  . Symptomatic bradycardia 06/21/2016  . Digoxin  toxicity 06/21/2016  . Chronic respiratory failure with hypoxia and hypercapnia (HCC) 06/13/2016  . Diarrhea 04/07/2016  . Anemia 04/07/2016  . Localized edema 04/07/2016  . Combined congestive systolic and diastolic heart failure (HCC) 03/20/2016  . Palliative care encounter   . Goals of care, counseling/discussion   . Dyspnea   . Atrial fibrillation with RVR (HCC)   . Pleural effusion on left 03/09/2016  . Atrial fibrillation (HCC) 03/09/2016  . Hyperlipidemia   . Type 2 diabetes mellitus (HCC) 12/26/2009  . HYPERLIPIDEMIA 12/26/2009  . Essential hypertension, benign 12/26/2009  . Coronary atherosclerosis of native coronary artery 12/26/2009  . COPD (chronic obstructive pulmonary disease) (HCC) 12/26/2009    Past Surgical History:  Procedure Laterality Date  . Cesareen section    . CHOLECYSTECTOMY    . COLONOSCOPY  09/2004   RMR: For obscure GI bleed. Left-sided diverticula, otherwise normal ileocolonoscopy  . Coronary artery stent  1999   BMS x 3 RCA  ([facility]), PTCRA RC4  . EP IMPLANTABLE DEVICE N/A 06/26/2016   Procedure: Pacemaker Implant;  Surgeon: Will Jorja Loa, MD;  Location: [facility] INVASIVE CV LAB;  Service: Cardiovascular;  Laterality: N/A;  . ESOPHAGOGASTRODUODENOSCOPY  11 2005   RMR: For melena, normal exam.  . EXPLORATORY LAPAROTOMY    . GIVENS CAPSULE STUDY  08/2004   official report unavailable, but reported couple of AVMs, nonbleeding  . IR PERC PLEURAL DRAIN W/INDWELL CATH W/IMG GUIDE  05/27/2018  . PARTIAL COLECTOMY  1977   blockage/gangrene?  unclear if small bowel or colon  . THORACENTESIS Left 03/10/16   1 L transudative fluid     OB History   None      Home Medications    Prior to Admission medications   Medication Sig Start Date End Date Taking? Authorizing Provider  apixaban (ELIQUIS) 5 MG TABS tablet Take 1 tablet (5 mg total) by mouth 2 (two) times daily. 03/20/16  Yes Erick Blinks, MD  bisoprolol (ZEBETA) 5 MG tablet Take 0.5 tablets (2.5 mg  total) by mouth daily. Pt needs appointment for further refills. 06/10/18  Yes Erick Blinks, MD  escitalopram (LEXAPRO) 20 MG tablet Take 20 mg by mouth at bedtime.    Yes [provider]  furosemide (LASIX) 40 MG tablet Take 1 tablet (40 mg total) by mouth 2 (two) times daily. Daily at 0800 am 06/10/18  Yes Erick Blinks, MD  LORazepam (ATIVAN) 0.5 MG tablet Take 1 tablet (0.5 mg total) by mouth at bedtime. Take one tablet by mouth once daily 06/14/18  Yes Lassen, Arlo C, PA-C  Melatonin 10 MG TABS Take 1 tablet by mouth at bedtime.   Yes [provider]  Multiple Vitamins-Minerals (CENTRUM SILVER 50+WOMEN PO) Take 1 tablet by mouth daily.   Yes [provider]  omeprazole (PRILOSEC) 20 MG capsule Take 1 capsule (20 mg total) by mouth 2 (two) times daily before a meal. 06/12/16  Yes Nyoka Cowden, MD  potassium chloride (K-DUR,KLOR-CON) 10 MEQ tablet Take 10 mEq by mouth 2 (two) times daily.   Yes [provider]  vitamin B-12 (CYANOCOBALAMIN) 1000 MCG tablet Take 1,000 mcg by mouth daily.   Yes [provider]  nitroGLYCERIN (NITROSTAT) 0.4 MG SL tablet Place 1 tablet (0.4 mg total) under the tongue every 5 (five) minutes as needed for chest pain. 04/24/14   Jonelle Sidle, MD  triamcinolone cream (KENALOG) 0.1 % Apply 1 application topically 2 (two) times daily. Apply to fungal rash of bilateral buttocks and sacrum for 10 days from 06/11/2018-06/20/2018    [provider]    Family History Family History  Problem Relation Age of Onset  . Diabetes Mother   . Diabetes Father   . Heart disease Father   . Cancer Sister        unknown  . Cancer Brother        unknown  . Diabetes Brother   . Colon cancer Neg Hx     Social History Social History   Tobacco Use  . Smoking status: Former Smoker    Packs/day: 2.00    Years: 40.00    Pack years: 80.00    Types: Cigarettes    Last attempt to quit: 01/13/1998    Years since quitting:  20.4  . Smokeless tobacco: Never Used  Substance Use Topics  . Alcohol use: No    Alcohol/week: 0.0 standard drinks  . Drug use: No     Allergies   Codeine and Heparin   Review of Systems Review of Systems  Constitutional: Negative for appetite change and fatigue.  HENT: Negative for congestion, ear discharge and sinus pressure.   Eyes: Negative for discharge.  Respiratory: Negative for cough.   Cardiovascular: Negative for chest pain.  Gastrointestinal: Negative for abdominal pain and diarrhea.  Genitourinary: Negative for frequency and hematuria.  Musculoskeletal: Negative for back pain.  Skin: Negative for rash.  Neurological: Negative for seizures and headaches.  Psychiatric/Behavioral: Negative for hallucinations.     Physical Exam Updated Vital  Signs BP 114/70   Pulse (!) 108   Ht 5\' 4"  (1.626 m)   Wt 64.5 kg   SpO2 100%   BMI 24.41 kg/m   Physical Exam  Constitutional: She is oriented to person, place, and time. She appears well-developed.  HENT:  Head: Normocephalic.  Eyes: Conjunctivae and EOM are normal. No scleral icterus.  Neck: Neck supple. No thyromegaly present.  Cardiovascular: Normal rate and regular rhythm. Exam reveals no gallop and no friction rub.  No murmur heard. Pulmonary/Chest: No stridor. She has no wheezes. She has no rales. She exhibits no tenderness.  Patient has a detailed chest tube on the left side  Abdominal: She exhibits no distension. There is no tenderness. There is no rebound.  Musculoskeletal: Normal range of motion. She exhibits no edema.  Lymphadenopathy:    She has no cervical adenopathy.  Neurological: She is oriented to person, place, and time. She exhibits normal muscle tone. Coordination normal.  Skin: No rash noted. No erythema.  Psychiatric: She has a normal mood and affect. Her behavior is normal.     ED Treatments / Results  Labs (all labs ordered are listed, but only abnormal results are displayed) Labs  Reviewed - No data to display  EKG None  Radiology Dg Chest 2 View  Result Date: 06/14/2018 CLINICAL DATA:  Shortness of breath. EXAM: CHEST - 2 VIEW COMPARISON:  Radiograph of June 07, 2018. FINDINGS: Stable cardiomegaly. Atherosclerosis of thoracic aorta is noted. Left-sided pacemaker is unchanged in position. Left-sided chest tube has been partially withdrawn, although distal tip appears to be within thoracic space. Mild left apical pneumothorax is now noted. Right lung is clear. Minimal bilateral pleural effusions are noted. Bony thorax is unremarkable. IMPRESSION: Mild left apical pneumothorax is noted. Left-sided chest tube appears to have been partially withdrawn, although distal tip remains within the thoracic space. Minimal bilateral pleural effusions. Aortic Atherosclerosis (ICD10-I70.0). Electronically Signed   By: Lupita RaiderJames  Green Jr, M.D.   On: 06/14/2018 14:04    Procedures Procedures (including critical care time)  Medications Ordered in ED Medications - No data to display   Initial Impression / Assessment and Plan / ED Course  I have reviewed the triage vital signs and the nursing notes.  Pertinent labs & imaging results that were available during my care of the patient were reviewed by me and considered in my medical decision making (see chart for details).     Chest x-ray shows small apical pneumothorax.  Pigtail chest tube is still in the thorax but has moved slightly.  Dr. Juanetta GoslingHawkins was called and he stated he would like to leave the tube in and place her on suction if possible at the nursing home if not they can just have it put a drain in a bag.  Patient will return tomorrow for repeat chest x-ray and the results should be called to Dr. Juanetta GoslingHawkins for his care  Final Clinical Impressions(s) / ED Diagnoses   Final diagnoses:  Other pneumothorax    ED Discharge Orders    None       Bethann BerkshireZammit, Keshauna Degraffenreid, MD 06/14/18 1616

## 2018-06-14 NOTE — Discharge Instructions (Addendum)
Patient still has her chest tube in her chest cavity.  She now has a small pneumothorax.  Dr. Juanetta GoslingHawkins would like the chest tube hooked up to suction.  If at the nursing home they are unable to do suction then the chest tube can be just sucked up to drain.  Patient needs to return to any [facility] to get a chest x-ray and the results need to be called to Dr. Juanetta GoslingHawkins tomorrow

## 2018-06-14 NOTE — Progress Notes (Signed)
Location:    [facility] Room Number: 159/P Place of Service:  SNF [phone](31) Provider:  Loraine LericheArlo Lassen  Hall, Kathy Z, MD  Patient Care Team: Benita StabileHall, Kathy Z, MD as PCP - General (Internal Medicine) Jonelle SidleMcDowell, Samuel G, MD as PCP - Cardiology (Cardiology) Jena Gaussourk, Gerrit Friendsobert M, MD as Consulting Physician (Gastroenterology)  Extended Emergency Contact Information Primary Emergency Contact: [name],[name]          , [place] [id number] [place] Home Phone: [phone] Mobile Phone: [phone] Relation: Daughter Secondary Emergency Contact: Dixon,Kathy Lindi AdieAnn  [place] Mobile Phone: [phone] Relation: None  Code Status:  Full Code Goals of care: Advanced Directive information Advanced Directives 06/14/2018  Does Patient Have a Medical Advance Directive? Yes  Type of Advance Directive (No Data)  Does patient want to make changes to medical advance directive? No - Patient declined  Copy of Healthcare Power of Attorney in Chart? -  Would patient like information on creating a medical advance directive? No - Patient declined    Chief complaint-acute visit secondary to chest tube possibly partially coming out HPI:  Pt is a 84 y.o. female seen today for an acute visit for her chest tube coming out.   Was recently admitted here after hospitalization for respiratory failure thought to be multifactorial with pneumonia as well as CHF and COPD exacerbation.  She was treated with IV Lasix transition to oral Lasix-she also diagnosed with pneumonia with exudative pleural effusion treated with IV antibiotics and transition to oral doxycycline which she completed in the hospital.  She also had placement of a catheter in her left chest by interventional radiology and that improved her symptoms as well.  Recommendation was when total discharge is less than 10 to 20 cm a day to remove catheter.  She has tolerated the tube well but apparently in the  bathroom today she was attempting to reposition herself and pulled on the tube inadvertently.-External part of the tube did come off- there is concern possibly the internal part was displaced  She is not complaining of any respiratory distress or pain-denies any chest pain- vital signs appear to be stable  Past Medical History:  Diagnosis Date  . Asthma   . COPD (chronic obstructive pulmonary disease) (HCC)    Oxygen dependent  . Coronary atherosclerosis of native coronary artery   . Essential hypertension, benign   . History of GI bleed    Ulcer  . Hyperlipidemia   . Old inferior wall myocardial infarction 1999   [facility]  . PAF (paroxysmal atrial fibrillation) (HCC)   . Sinus node dysfunction (HCC)    [employer] PPM  . Type 2 diabetes mellitus (HCC)    Past Surgical History:  Procedure Laterality Date  . Cesareen section    . CHOLECYSTECTOMY    . COLONOSCOPY  09/2004   RMR: For obscure GI bleed. Left-sided diverticula, otherwise normal ileocolonoscopy  . Coronary artery stent  1999   BMS x 3 RCA  ([facility]), PTCRA RC4  . EP IMPLANTABLE DEVICE N/A 06/26/2016   Procedure: Pacemaker Implant;  Surgeon: Will Jorja LoaMartin Camnitz, MD;  Location: [facility] INVASIVE CV LAB;  Service: Cardiovascular;  Laterality: N/A;  . ESOPHAGOGASTRODUODENOSCOPY  11 2005   RMR: For melena, normal exam.  . EXPLORATORY LAPAROTOMY    . GIVENS CAPSULE STUDY  08/2004   official report unavailable, but reported couple of AVMs, nonbleeding  . IR PERC PLEURAL DRAIN W/INDWELL CATH W/IMG GUIDE  05/27/2018  . PARTIAL COLECTOMY  1977   blockage/gangrene? unclear if small bowel or colon  . THORACENTESIS Left 03/10/16   1 L transudative fluid    Allergies  Allergen Reactions  . Codeine Nausea Only  . Heparin Other (See Comments)    Causes internal bleeding     Outpatient Encounter Medications as of 06/14/2018  Medication Sig  . apixaban (ELIQUIS) 5 MG TABS tablet Take 1 tablet (5 mg total) by mouth 2 (two) times daily.  .  bisoprolol (ZEBETA) 5 MG tablet Take 0.5 tablets (2.5 mg total) by mouth daily. Pt needs appointment for further refills.  Marland Kitchen escitalopram (LEXAPRO) 20 MG tablet Take 20 mg by mouth at bedtime.   . furosemide (LASIX) 40 MG tablet Take 1 tablet (40 mg total) by mouth 2 (two) times daily. Daily at 0800 am  . ipratropium-albuterol (DUONEB) 0.5-2.5 (3) MG/3ML SOLN Take 3 mLs by nebulization 3 (three) times daily.  Marland Kitchen LORazepam (ATIVAN) 0.5 MG tablet Take 1 tablet (0.5 mg total) by mouth at bedtime. Take one tablet by mouth once daily  . Melatonin 10 MG TABS Take 1 tablet by mouth at bedtime.  . Multiple Vitamins-Minerals (CENTRUM SILVER 50+WOMEN PO) Take 1 tablet by mouth daily.  . nitroGLYCERIN (NITROSTAT) 0.4 MG SL tablet Place 1 tablet (0.4 mg total) under the tongue every 5 (five) minutes as needed for chest pain.  Marland Kitchen omeprazole (PRILOSEC) 20 MG capsule Take 1 capsule (20 mg total) by mouth 2 (two) times daily before a meal.  . OXYGEN Inhale 2.5 L into the lungs continuous.  . potassium chloride (K-DUR,KLOR-CON) 10 MEQ tablet Take 10 mEq by mouth 2 (two) times daily.  Marland Kitchen triamcinolone cream (KENALOG) 0.1 % Apply 1 application topically 2 (two) times daily. Apply to fungal rash of bilateral buttocks and sacrum for 10 days from 06/11/2018-06/20/2018  . vitamin B-12 (CYANOCOBALAMIN) 1000 MCG tablet Take 1,000 mcg by mouth daily.   No facility-administered encounter medications on file as of 06/14/2018.     Review of Systems   General she is not complaining any fever chills has no pain complaints.  Skin does not complain of rashes itching or bleeding.  Head ears eyes nose mouth and throat is not complain of visual changes or sore throat.  Respiratory denies shortness of breath or cough at this time.  Cardiac does not complain of any chest pain.  GI does not complain of abdominal discomfort nausea or vomiting.  Musculoskeletal is not complaining of joint pain currently.  Neurologic does not  complain of feeling dizzy or having a headache or any syncopal type symptoms.  Psych is not complaining of being depressed or anxious is somewhat downhearted because the tube came out  Immunization History  Administered Date(s) Administered  . Influenza-Unspecified 09/19/2013, 07/20/2014   Pertinent  Health Maintenance Due  Topic Date Due  . INFLUENZA VACCINE  07/15/2018 (Originally 05/20/2018)  . FOOT EXAM  07/15/2018 (Originally 04/28/1941)  . OPHTHALMOLOGY EXAM  07/15/2018 (Originally 04/28/1941)  . URINE MICROALBUMIN  07/15/2018 (Originally 04/28/1941)  . DEXA SCAN  07/15/2018 (Originally 04/27/1998)  . PNA vac Low Risk Adult (1 of 2 - PCV13) 07/15/2018 (Originally 04/27/1998)  . HEMOGLOBIN A1C  11/22/2018   No flowsheet data found. Functional Status Survey:    Vitals:   06/14/18 1313  BP: 94/64  Pulse: 94  Resp: 18  Temp: 97.6 F (36.4 C)  TempSrc: Oral   Manual blood pressure was 110/60   Physical Exam  In general this is a pleasant elderly female in  no distress sitting comfortably in in her wheelchair she is somewhat upset the tube came out  Skin is warm and dry bandaging is around the tube site I do not see evidence of bleeding or drainage--part of the tubing has come out  Eyes visual acuity appears to be intact sclera and conjunctive are clear.  Chest is clear to auscultation with somewhat shallow breath sounds there is no labored breathing.  Heart is regular irregular rate and rhythm with rate in the 80s she does not have significant lower extremity edema  Abdomen is soft nontender with positive bowel sounds.  Musculoskeletal does move all extremities x4 he is ambulating in wheelchair.  Neurologic appears grossly intact no lateralizing findings her speech is clear cranial nerves intact.  Psych she is alert and oriented pleasant but a bit anxious because the tube came out a lot rated her  Labs reviewed: Recent Labs    06/06/18 0623 06/07/18 0921 06/11/18 0615  NA  136 137 136  K 4.0 3.7 3.9  CL 94* 89* 93*  CO2 34* 37* 34*  GLUCOSE 96 158* 150*  BUN 30* 29* 29*  CREATININE 0.84 0.89 1.03*  CALCIUM 8.5* 9.0 8.6*  MG 2.1  --   --    Recent Labs    05/23/18 0600 05/26/18 0412 06/07/18 0921  AST 15 14* 22  ALT 18 17 48*  ALKPHOS 42 32* 46  BILITOT 0.7 0.8 0.8  PROT 6.5 5.2* 5.7*  ALBUMIN 3.8 2.9* 3.2*   Recent Labs    05/21/18 1129 05/23/18 0600  06/05/18 0709 06/06/18 0623 06/11/18 0615  WBC 8.8 9.6   < > 9.3 8.2 9.5  NEUTROABS 6.4 8.0*  --   --   --  7.3  HGB 10.2* 9.4*   < > 9.0* 8.3* 9.9*  HCT 35.0* 34.5*   < > 28.8* 27.8* 33.2*  MCV 97.8 102.4*   < > 90.9 91.1 88.5  PLT 200 223   < > 251 224 205   < > = values in this interval not displayed.   Lab Results  Component Value Date   TSH 2.866 05/21/2018   Lab Results  Component Value Date   HGBA1C 6.0 (H) 05/22/2018   No results found for: CHOL, HDL, LDLCALC, LDLDIRECT, TRIG, CHOLHDL  Significant Diagnostic Results in last 30 days:  Dg Chest 1 View  Result Date: 06/04/2018 CLINICAL DATA:  Pleural effusion EXAM: CHEST  1 VIEW COMPARISON:  05/30/2018, 05/29/2018, CT 05/24/2018 FINDINGS: Left-sided pacing device as before. Right upper extremity catheter tip overlies the distal SVC. There are small bilateral pleural effusions. There is a left lower chest drainage catheter tip appears slightly more lateral as compared with 05/29/2018. There is decreased left pleural effusion. Stable enlarged cardiomediastinal silhouette with aortic atherosclerosis. No pneumothorax is seen. IMPRESSION: 1. Left lung base drainage catheter remains in place. There appears to be decreased left effusion. 2. There are small pleural effusions bilaterally. 3. There is stable cardiomegaly. Electronically Signed   By: Jasmine Pang M.D.   On: 06/04/2018 16:34   Dg Chest 1 View  Result Date: 05/21/2018 CLINICAL DATA:  LEFT pleural effusion post thoracentesis EXAM: CHEST  1 VIEW COMPARISON:  Earlier exam of  05/21/2018 FINDINGS: Stable LEFT subclavian pacemaker and cardiac enlargement. Atherosclerotic calcification aorta. Persistent LEFT basilar opacity by atelectasis and effusion. No pneumothorax following thoracentesis. Remaining lungs clear. Bones demineralized. IMPRESSION: No pneumothorax following LEFT thoracentesis. Electronically Signed   By: Angelyn Punt.D.  On: 05/21/2018 15:59   Dg Chest 1 View  Result Date: 05/21/2018 CLINICAL DATA:  Lateral radiograph requested.  Query pneumonia. EXAM: CHEST  1 VIEW COMPARISON:  AP radiograph done earlier. : The heart remains enlarged. Retrocardiac density likely effusion and atelectasis, no definite consolidation, is increased from 05/10/2018. IMPRESSION: Worsening aeration. Electronically Signed   By: Elsie Stain M.D.   On: 05/21/2018 12:48   Dg Chest 2 View  Result Date: 05/30/2018 CLINICAL DATA:  History of pleural effusions and status post pigtail drainage catheter placement to drain a left pleural effusion on 05/27/2018. EXAM: CHEST - 2 VIEW COMPARISON:  Portable chest x-ray on 05/29/2018 FINDINGS: The left basilar pleural drainage catheter is partially visualized on the frontal projection. In the lateral projection, good visualization posterior sulcus although left supports likely adequate pleural fluid drainage with no significant pleural fluid. There probably is some component a small amount of pleural fluid on the right. No overt edema present. No pneumothorax. Stable cardiac enlargement. Stable right upper extremity PICC line positioning with the catheter tip in the distal SVC. Stable appearance of pacemaker. IMPRESSION: Based on the lateral chest x-ray, there likely is not a significant amount of residual left pleural fluid after pigtail catheter drainage. A small amount of pleural fluid is suspected on the right. No overt edema. Electronically Signed   By: Irish Lack M.D.   On: 05/30/2018 10:10   Ct Head Wo Contrast  Result Date:  05/22/2018 CLINICAL DATA:  84 y/o F; Altered level of consciousness (LOC), unexplained. EXAM: CT HEAD WITHOUT CONTRAST TECHNIQUE: Contiguous axial images were obtained from the base of the skull through the vertex without intravenous contrast. COMPARISON:  06/21/2016 CT head FINDINGS: Brain: No evidence of acute infarction, hemorrhage, hydrocephalus, extra-axial collection or mass lesion/mass effect. Mild chronic microvascular ischemic changes and volume loss of the brain for age. Vascular: Calcific atherosclerosis of carotid siphons. No hyperdense vessel identified. Skull: Normal. Negative for fracture or focal lesion. Sinuses/Orbits: No acute finding. Other: None. IMPRESSION: 1. No acute intracranial abnormality identified. 2. Mild chronic microvascular ischemic changes and volume loss of the brain for age. Electronically Signed   By: Mitzi Hansen M.D.   On: 05/22/2018 23:45   Ct Chest Wo Contrast  Result Date: 05/24/2018 CLINICAL DATA:  Follow-up pleural effusion. EXAM: CT CHEST WITHOUT CONTRAST TECHNIQUE: Multidetector CT imaging of the chest was performed following the standard protocol without IV contrast. COMPARISON:  Chest x-ray dated 05/23/2018. Chest CT dated 08/31/2003. FINDINGS: Cardiovascular: Cardiomegaly. No pericardial effusion. Extensive aortic atherosclerosis. No thoracic aortic aneurysm. Three-vessel coronary artery calcifications. Mediastinum/Nodes: No mass or enlarged lymph nodes seen within the mediastinum or perihilar regions. Esophagus is unremarkable. Trachea and central bronchi are unremarkable. Lungs/Pleura: Bilateral pleural effusions, small to moderate in size, RIGHT greater than LEFT. Chronic scarring/atelectasis at the LEFT lung base. Chronic pleural based nodule along the lateral margin of the RIGHT lung, near the junction of the major and minor fissures. No evidence of pneumonia. No pneumothorax. Upper Abdomen: No acute findings. Musculoskeletal: Degenerative changes  throughout the slightly scoliotic thoracolumbar spine, mild to moderate in degree. No acute or suspicious osseous finding. Presumed chronic sebaceous cyst within the superficial soft tissues of the lower RIGHT back. Superficial soft tissues of the chest are otherwise unremarkable. IMPRESSION: 1. Bilateral pleural effusions, small to moderate in size, RIGHT greater than LEFT, with associated atelectasis. 2. No other acute appearing findings. 3. Cardiomegaly. 4. Diffuse coronary artery calcifications. Aortic Atherosclerosis (ICD10-I70.0). Electronically Signed   By: Weyman Croon  Linde Gillis M.D.   On: 05/24/2018 13:43   US Venous Img Lower Bilateral  Result Date: 05/22/2018 CLINICAL DATA:  84 year old female with bilateral lower extremity pain and shortness of breath EXAM: BILATERAL LOWER EXTREMITY VENOUS DOPPLER ULTRASOUND TECHNIQUE: Gray-scale sonography with graded compression, as well as color Doppler and duplex ultrasound were performed to evaluate the lower extremity deep venous systems from the level of the common femoral vein and including the common femoral, femoral, profunda femoral, popliteal and calf veins including the posterior tibial, peroneal and gastrocnemius veins when visible. The superficial great saphenous vein was also interrogated. Spectral Doppler was utilized to evaluate flow at rest and with distal augmentation maneuvers in the common femoral, femoral and popliteal veins. COMPARISON:  None. FINDINGS: RIGHT LOWER EXTREMITY Common Femoral Vein: No evidence of thrombus. Normal compressibility, respiratory phasicity and response to augmentation. Saphenofemoral Junction: No evidence of thrombus. Normal compressibility and flow on color Doppler imaging. Profunda Femoral Vein: No evidence of thrombus. Normal compressibility and flow on color Doppler imaging. Femoral Vein: No evidence of thrombus. Normal compressibility, respiratory phasicity and response to augmentation. Popliteal Vein: No evidence of  thrombus. Normal compressibility, respiratory phasicity and response to augmentation. Calf Veins: No evidence of thrombus. Normal compressibility and flow on color Doppler imaging. Superficial Great Saphenous Vein: No evidence of thrombus. Normal compressibility. Venous Reflux:  None. Other Findings:  None. LEFT LOWER EXTREMITY Common Femoral Vein: No evidence of thrombus. Normal compressibility, respiratory phasicity and response to augmentation. Saphenofemoral Junction: No evidence of thrombus. Normal compressibility and flow on color Doppler imaging. Profunda Femoral Vein: No evidence of thrombus. Normal compressibility and flow on color Doppler imaging. Femoral Vein: No evidence of thrombus. Normal compressibility, respiratory phasicity and response to augmentation. Popliteal Vein: No evidence of thrombus. Normal compressibility, respiratory phasicity and response to augmentation. Calf Veins: No evidence of thrombus. Normal compressibility and flow on color Doppler imaging. Superficial Great Saphenous Vein: No evidence of thrombus. Normal compressibility. Venous Reflux:  None. Other Findings:  None. IMPRESSION: 1. No evidence of deep venous thrombosis in either lower extremity. 2. Increased pulsatility of the venous waveforms bilaterally. Similar findings can be seen in the setting of elevated right heart pressures. Common differential considerations include tricuspid regurgitation, right-sided heart failure, pulmonary arterial hypertension and long-standing COPD. Electronically Signed   By: Malachy Moan M.D.   On: 05/22/2018 12:45   Dg Chest Port 1 View  Result Date: 06/07/2018 CLINICAL DATA:  Pulmonary edema. EXAM: PORTABLE CHEST 1 VIEW COMPARISON:  Eighteen 2019 and prior radiographs FINDINGS: Cardiomegaly, RIGHT PICC line with tip overlying the LOWER SVC, LEFT pacemaker and LEFT thoracostomy tube again noted. Decreased pulmonary edema identified with improved bilateral LOWER lung aeration. There is  no evidence of pneumothorax. No acute bony abnormalities are present. IMPRESSION: Decreased pulmonary edema with improved bilateral LOWER lung aeration. Electronically Signed   By: Harmon Pier M.D.   On: 06/07/2018 08:26   Dg Chest Port 1 View  Result Date: 06/06/2018 CLINICAL DATA:  Pleural effusion EXAM: PORTABLE CHEST 1 VIEW COMPARISON:  06/04/2018 FINDINGS: Worsening bilateral airspace disease. Progressive bilateral effusions with bibasilar atelectasis. Dual lead pacemaker unchanged. Left pleural pigtail catheter remains in place in the base. No pneumothorax. Right sided PICC tip in the SVC unchanged. IMPRESSION: Worsening diffuse bilateral airspace disease and bilateral effusions consistent with heart failure and edema. Pigtail chest tube on the left  without pneumothorax. Electronically Signed   By: Marlan Palau M.D.   On: 06/06/2018 08:39   Dg Chest [facility]  1 View  Result Date: 05/29/2018 CLINICAL DATA:  Follow-up chest x-ray.  Pleural effusion. EXAM: PORTABLE CHEST 1 VIEW COMPARISON:  May 23, 2018 FINDINGS: Stable cardiomegaly. Bilateral pleural effusions with underlying opacities, worsened in the interval. Stable right PICC line. No other changes. IMPRESSION: Increasing bilateral pleural effusions with underlying opacities. Electronically Signed   By: Gerome Sam III M.D   On: 05/29/2018 08:17   Dg Chest Port 1 View  Result Date: 05/23/2018 CLINICAL DATA:  Status post PICC line placement EXAM: PORTABLE CHEST 1 VIEW COMPARISON:  05/23/2018 FINDINGS: Cardiac shadow is stable. Pacing device is again seen. New right-sided PICC line is noted at the cavoatrial junction. Bilateral pleural effusions are noted. IMPRESSION: PICC line in satisfactory position. The remainder of the exam is stable. Electronically Signed   By: Alcide Clever M.D.   On: 05/23/2018 12:04   Dg Chest Port 1 View  Result Date: 05/23/2018 CLINICAL DATA:  Hypoxia. EXAM: PORTABLE CHEST 1 VIEW COMPARISON:  Radiographs 2 days ago  05/21/2018 FINDINGS: Left-sided pacemaker remains in place. Increasing [place] bibasilar opacity likely combination of pleural fluid and atelectasis. The heart is enlarged. Unchanged mediastinal contours from prior exam. Increasing vascular congestion. No pneumothorax. IMPRESSION: Increasing hazy opacity at the lung bases, left greater than right, likely pleural fluid and adjacent atelectasis. Increasing vascular congestion. Electronically Signed   By: Rubye Oaks M.D.   On: 05/23/2018 06:52   Dg Chest Portable 1 View  Result Date: 05/21/2018 CLINICAL DATA:  Short of breath and weakness. EXAM: PORTABLE CHEST 1 VIEW COMPARISON:  05/10/2018 FINDINGS: Cardiac enlargement with dual lead pacemaker. Negative for heart failure. Mild left lower lobe airspace disease and probable small left effusion unchanged from the prior study. Right lung remains clear. IMPRESSION: Mild left lower lobe airspace disease and probable left effusion unchanged. This area is difficult to evaluate on portable image given the large cardiac silhouette. Electronically Signed   By: Marlan Palau M.D.   On: 05/21/2018 11:45   Ir Perc Pleural Drain W/indwell Cath W/img Guide  Result Date: 05/27/2018 CLINICAL DATA:  Left empyema with residual effusion. Drainage requested. EXAM: INSERTION OF  PLEURAL DRAINAGE CATHETER ANESTHESIA/SEDATION: Intravenous Fentanyl and Versed were administered as conscious sedation during continuous monitoring of the patient's level of consciousness and physiological / cardiorespiratory status by the radiology RN, with a total moderate sedation time of 6 minutes. MEDICATIONS: No periprocedural antibiotics indicated FLUOROSCOPY TIME:  6 seconds; 1 mGy PROCEDURE: The procedure, risks, benefits, and alternatives were explained to the patient. Questions regarding the procedure were encouraged and answered. The patient understands and consents to the procedure. Under ultrasound, an appropriate skin entry site to approach  the left pleural effusion was localized and marked. The left chest wall was prepped with Betadine in a sterile fashion, and a sterile drape was applied covering the operative field. A sterile gown and sterile gloves were used for the procedure. Local anesthesia was provided with 1% Lidocaine. Ultrasound image documentation was performed. Fluoroscopy during the procedure and fluoroscopic spot radiograph confirms appropriate catheter position. After creating a small skin incision, a 19 gauge needle was advanced into the pleural cavity under ultrasound guidance. A guide wire was then advanced under fluoroscopy into the pleural space. Guidewire position confirmed under fluoroscopy. Pleural access was dilated serially and a 12 French pigtail catheter was placed. Catheter was connected to externally [place] Pleur-evac device. Catheter was secured externally with 0 Prolene suture and covered with Vaseline gauze and sterile gauze dressing. Fluoroscopic spot image  confirms good catheter position. The patient tolerated the procedure well. COMPLICATIONS: None. FINDINGS: Residual pleural effusion at the left lung base was confirmed with ultrasound. 12 French pigtail drain catheter placed as above. IMPRESSION: 1. Technically successful left pleural drain placement. Electronically Signed   By: Corlis Leak M.D.   On: 05/27/2018 16:18   US Thoracentesis Asp Pleural Space W/img Guide  Result Date: 05/21/2018 INDICATION: LEFT pleural effusion EXAM: ULTRASOUND GUIDED DIAGNOSTIC LEFT THORACENTESIS MEDICATIONS: None. COMPLICATIONS: None immediate. PROCEDURE: Procedure, benefits, and risks of procedure were discussed with patient. Written informed consent for procedure was obtained. Time out protocol followed. Pleural effusion localized by ultrasound at the posterior LEFT hemithorax. Skin prepped and draped in usual sterile fashion. Skin and soft tissues anesthetized with 10 ML of 1% lidocaine. 8 French thoracentesis catheter placed  into the LEFT pleural space. 300 mL of clear serous fluid aspirated by syringe pump. Procedure tolerated well by patient without immediate complication. FINDINGS: A total of approximately 300 mL of LEFT pleural fluid was removed. Samples were sent to the laboratory as requested by the clinical team. IMPRESSION: Successful ultrasound guided LEFT thoracentesis yielding 300 mL of pleural fluid. Electronically Signed   By: Ulyses Southward M.D.   On: 05/21/2018 15:57    Assessment/Plan  #1 history of chest tube placement with pleural effusion- again the external part of the tubing appears to have come off but she is not in any distress- I suspect this will need to be checked expediently and will send her to the ER for expedient evaluation.  About the extent of displacement  She has had fairly minimal output from her catheter.  2.  CHF this appears stable she does not really show signs of increased edema will need serial weights to monitor this- rates I see listed shows stability in the low 140s.  She is not complaining of shortness of breath.  3.  History of COPD continues on chronic oxygen as well as BiPAP and duo nebs 3 times daily this appears stable.  4-- atrial fibrillation this appears rate controlled she is on a beta-blocker continues on Eliquis for anticoagulation  Again will send her to the ER for expedient evaluation  [phone]

## 2018-06-14 NOTE — ED Notes (Signed)
Closed drainage tube left chest 12 Fr. Chest tubing was pulled off of drainage tubing. Dressing status ins intact. Sutures, securing device and drain is in tact at insertion site. There is no erythema, bleeding, purulence or drainage around tube.

## 2018-06-14 NOTE — Telephone Encounter (Signed)
RX Fax for [facility]@ [phone]  

## 2018-06-15 ENCOUNTER — Encounter: Payer: Self-pay | Admitting: Internal Medicine

## 2018-06-15 ENCOUNTER — Non-Acute Institutional Stay (SKILLED_NURSING_FACILITY): Payer: Medicare Other | Admitting: Internal Medicine

## 2018-06-15 ENCOUNTER — Ambulatory Visit (HOSPITAL_COMMUNITY)
Admit: 2018-06-15 | Discharge: 2018-06-15 | Disposition: A | Discharge status: Home / Self Care | Payer: Medicare Other | Source: Ambulatory Visit | Attending: Internal Medicine | Admitting: Internal Medicine

## 2018-06-15 DIAGNOSIS — E1159 Type 2 diabetes mellitus with other circulatory complications: Secondary | ICD-10-CM

## 2018-06-15 DIAGNOSIS — J939 Pneumothorax, unspecified: Secondary | ICD-10-CM | POA: Insufficient documentation

## 2018-06-15 DIAGNOSIS — I5043 Acute on chronic combined systolic (congestive) and diastolic (congestive) heart failure: Secondary | ICD-10-CM | POA: Diagnosis not present

## 2018-06-15 DIAGNOSIS — I10 Essential (primary) hypertension: Secondary | ICD-10-CM

## 2018-06-15 DIAGNOSIS — Z4682 Encounter for fitting and adjustment of non-vascular catheter: Secondary | ICD-10-CM | POA: Diagnosis not present

## 2018-06-15 DIAGNOSIS — Z9889 Other specified postprocedural states: Secondary | ICD-10-CM

## 2018-06-15 DIAGNOSIS — J438 Other emphysema: Secondary | ICD-10-CM | POA: Diagnosis not present

## 2018-06-15 DIAGNOSIS — J9811 Atelectasis: Secondary | ICD-10-CM | POA: Insufficient documentation

## 2018-06-15 NOTE — Progress Notes (Signed)
Location:    [facility] Room Number: 159/P Place of Service:  SNF [id number]) Provider:  Loraine Leriche, MD  Patient Care Team: Benita Stabile, MD as PCP - General (Internal Medicine) Jonelle Sidle, MD as PCP - Cardiology (Cardiology) Jena Gauss Gerrit Friends, MD as Consulting Physician (Gastroenterology)  Extended Emergency Contact Information Primary Emergency Contact: Lurlean Horns, [place] [address] [place] of [place] Home Phone: [phone] Mobile Phone: [phone] Relation: Daughter Secondary Emergency Contact: Dixon,Lou Lindi [place] of [place] Mobile Phone: [phone] Relation: None  Code Status:  Full Code Goals of care: Advanced Directive information Advanced Directives 06/15/2018  Does Patient Have a Medical Advance Directive? Yes  Type of Advance Directive (No Data)  Does patient want to make changes to medical advance directive? No - Patient declined  Copy of Healthcare Power of Attorney in Chart? -  Would patient like information on creating a medical advance directive? No - Patient declined     Chief Complaint  Patient presents with  . Acute Visit    Patient is being seen for f/u ED visit    HPI:  Pt is a 84 y.o. female seen today for an acute visit for follow-up of emergency room visit yesterday secondary to her chest tube partially coming off  Was recently admitted here after hospitalization for respiratory failure thought to be multifactorial with pneumonia as well as CHF and COPD exacerbation.  She was treated with IV Lasix transition to oral Lasix-she also diagnosed with pneumonia with exudative pleural effusion treated with IV antibiotics and transition to oral doxycycline which she completed in the hospital.  She also had placement of a catheter in her left chest by interventional radiology and that improved her symptoms as well.  Recommendation was when total discharge is less than 10 to 20 cm  a day to remove catheter.  Apparently when doing personal care in the bathroom yesterday she inadvertently talked on the tube and the external part appeared to come off-there was concern for possible trauma to the internal tube and she was sent to the ER for evaluation  Chest x-ray in the ER did show a small apical pneumothorax- showed chest tube still in the thorax but slightly moved  Dr. Juanetta Gosling pulmonologist was called and said he would like to leave the tube in place  She did have a repeat x-ray as recommended today--which did not show any change since yesterday small apical pneumothorax was 5% or less with mild left base atelectasis.  She does appear to have a small amount of drainage today  Signs are stable she is not complaining of any shortness of breath or pain appears to be in better spirits     Past Medical History:  Diagnosis Date  . Asthma   . COPD (chronic obstructive pulmonary disease) (HCC)    Oxygen dependent  . Coronary atherosclerosis of native coronary artery   . Essential hypertension, benign   . History of GI bleed    Ulcer  . Hyperlipidemia   . Old inferior wall myocardial infarction 1999   [facility]  . PAF (paroxysmal atrial fibrillation) (HCC)   . Sinus node dysfunction (HCC)    [employer] PPM  . Type 2 diabetes mellitus (HCC)    Past Surgical History:  Procedure Laterality Date  . Cesareen section    . CHOLECYSTECTOMY    . COLONOSCOPY  09/2004   RMR: For obscure GI  bleed. Left-sided diverticula, otherwise normal ileocolonoscopy  . Coronary artery stent  1999   BMS x 3 RCA  ([facility]), PTCRA RC4  . EP IMPLANTABLE DEVICE N/A 06/26/2016   Procedure: Pacemaker Implant;  Surgeon: Will Jorja LoaMartin Camnitz, MD;  Location: [facility] INVASIVE CV LAB;  Service: Cardiovascular;  Laterality: N/A;  . ESOPHAGOGASTRODUODENOSCOPY  11 2005   RMR: For melena, normal exam.  . EXPLORATORY LAPAROTOMY    . GIVENS CAPSULE STUDY  08/2004   official report unavailable, but reported couple  of AVMs, nonbleeding  . IR PERC PLEURAL DRAIN W/INDWELL CATH W/IMG GUIDE  05/27/2018  . PARTIAL COLECTOMY  1977   blockage/gangrene? unclear if small bowel or colon  . THORACENTESIS Left 03/10/16   1 L transudative fluid    Allergies  Allergen Reactions  . Codeine Nausea Only  . Heparin Other (See Comments)    Causes internal bleeding     Outpatient Encounter Medications as of 06/15/2018  Medication Sig  . apixaban (ELIQUIS) 5 MG TABS tablet Take 1 tablet (5 mg total) by mouth 2 (two) times daily.  . bisoprolol (ZEBETA) 5 MG tablet Take 0.5 tablets (2.5 mg total) by mouth daily. Pt needs appointment for further refills.  Marland Kitchen. escitalopram (LEXAPRO) 20 MG tablet Take 20 mg by mouth at bedtime.   . furosemide (LASIX) 40 MG tablet Take 1 tablet (40 mg total) by mouth 2 (two) times daily. Daily at 0800 am  . ipratropium-albuterol (DUONEB) 0.5-2.5 (3) MG/3ML SOLN Take 3 mLs by nebulization 3 (three) times daily.  Marland Kitchen. LORazepam (ATIVAN) 0.5 MG tablet Take 1 tablet (0.5 mg total) by mouth at bedtime. Take one tablet by mouth once daily  . Melatonin 10 MG TABS Take 1 tablet by mouth at bedtime.  . Multiple Vitamins-Minerals (CENTRUM SILVER 50+WOMEN PO) Take 1 tablet by mouth daily.  . nitroGLYCERIN (NITROSTAT) 0.4 MG SL tablet Place 1 tablet (0.4 mg total) under the tongue every 5 (five) minutes as needed for chest pain.  Marland Kitchen. omeprazole (PRILOSEC) 20 MG capsule Take 1 capsule (20 mg total) by mouth 2 (two) times daily before a meal.  . potassium chloride (K-DUR,KLOR-CON) 10 MEQ tablet Take 10 mEq by mouth 2 (two) times daily.  Marland Kitchen. triamcinolone cream (KENALOG) 0.1 % Apply 1 application topically 2 (two) times daily. Apply to fungal rash of bilateral buttocks and sacrum for 10 days from 06/11/2018-06/20/2018  . vitamin B-12 (CYANOCOBALAMIN) 1000 MCG tablet Take 1,000 mcg by mouth daily.   No facility-administered encounter medications on file as of 06/15/2018.     Review of Systems   General she is not  complaining of any fever or chills.  Skin does not complain of rashes or itching.  Head ears eyes nose mouth and throat no complaints of visual changes or sore throat swallowing.  Respiratory denies shortness of breath or cough.  Cardiac is not complaining of chest pain has some mild lower extremity edema.  Musculoskeletal does not complain of joint or thorax pain today.  Neurologic does not of headache or syncopal- sometimes when she works with therapy she says she will occasionally feel a little transitory dizziness  Psych does not complain of being depressed or anxious appears to be feeling better than yesterday  Immunization History  Administered Date(s) Administered  . Influenza-Unspecified 09/19/2013, 07/20/2014   Pertinent  Health Maintenance Due  Topic Date Due  . INFLUENZA VACCINE  07/15/2018 (Originally 05/20/2018)  . FOOT EXAM  07/15/2018 (Originally 04/28/1941)  . OPHTHALMOLOGY EXAM  07/15/2018 (Originally 04/28/1941)  .  URINE MICROALBUMIN  07/15/2018 (Originally 04/28/1941)  . DEXA SCAN  07/15/2018 (Originally 04/27/1998)  . PNA vac Low Risk Adult (1 of 2 - PCV13) 07/15/2018 (Originally 04/27/1998)  . HEMOGLOBIN A1C  11/22/2018   No flowsheet data found. Functional Status Survey:    Vitals:   06/15/18 1111  BP: 122/76  Pulse: 60  Resp: 18  Temp: (!) 97.5 F (36.4 C)  TempSrc: Oral  SpO2: 95%  Weight is 142.8 pounds- manual blood pressure was 120/70-pulse was 80 Previous blood pressure was 130/74 Physical Exam   In general this is a pleasant elderly female in no distress lying comfortably in bed.  Her skin is warm and dry continues to have bandaging around the chest tube site I do not see any drainage or bleeding.  Eyes visual acuity appears to be intact sclera and conjunctive are clear.  Chest is clear to auscultation there is no labored breathing. She does have a small amount of tan-colored  drainage from the chest tube-area of the chest tube insertion is  bandaged I do not see any drainage or bleeding   Heart is regular rate and rhythm with occasional irregular beats she has  Quite  mild lower extremity edema.  Abdomen is soft nontender with positive bowel sounds.  Musculoskeletal moves all extremities x4 she is ambulatory in wheelchair.  Neurologic appears grossly intact no lateralizing findings her speech is clear.  Psych she is alert and oriented pleasant and appropriate    Labs reviewed: Recent Labs    06/06/18 0623 06/07/18 0921 06/11/18 0615  NA 136 137 136  K 4.0 3.7 3.9  CL 94* 89* 93*  CO2 34* 37* 34*  GLUCOSE 96 158* 150*  BUN 30* 29* 29*  CREATININE 0.84 0.89 1.03*  CALCIUM 8.5* 9.0 8.6*  MG 2.1  --   --    Recent Labs    05/23/18 0600 05/26/18 0412 06/07/18 0921  AST 15 14* 22  ALT 18 17 48*  ALKPHOS 42 32* 46  BILITOT 0.7 0.8 0.8  PROT 6.5 5.2* 5.7*  ALBUMIN 3.8 2.9* 3.2*   Recent Labs    05/21/18 1129 05/23/18 0600  06/05/18 0709 06/06/18 0623 06/11/18 0615  WBC 8.8 9.6   < > 9.3 8.2 9.5  NEUTROABS 6.4 8.0*  --   --   --  7.3  HGB 10.2* 9.4*   < > 9.0* 8.3* 9.9*  HCT 35.0* 34.5*   < > 28.8* 27.8* 33.2*  MCV 97.8 102.4*   < > 90.9 91.1 88.5  PLT 200 223   < > 251 224 205   < > = values in this interval not displayed.   Lab Results  Component Value Date   TSH 2.866 05/21/2018   Lab Results  Component Value Date   HGBA1C 6.0 (H) 05/22/2018   No results found for: CHOL, HDL, LDLCALC, LDLDIRECT, TRIG, CHOLHDL  Significant Diagnostic Results in last 30 days:  Dg Chest 1 View  Result Date: 06/04/2018 CLINICAL DATA:  Pleural effusion EXAM: CHEST  1 VIEW COMPARISON:  05/30/2018, 05/29/2018, CT 05/24/2018 FINDINGS: Left-sided pacing device as before. Right upper extremity catheter tip overlies the distal SVC. There are small bilateral pleural effusions. There is a left lower chest drainage catheter tip appears slightly more lateral as compared with 05/29/2018. There is decreased left pleural  effusion. Stable enlarged cardiomediastinal silhouette with aortic atherosclerosis. No pneumothorax is seen. IMPRESSION: 1. Left lung base drainage catheter remains in place. There appears to be decreased  left effusion. 2. There are small pleural effusions bilaterally. 3. There is stable cardiomegaly. Electronically Signed   By: Jasmine Pang M.D.   On: 06/04/2018 16:34   Dg Chest 1 View  Result Date: 05/21/2018 CLINICAL DATA:  LEFT pleural effusion post thoracentesis EXAM: CHEST  1 VIEW COMPARISON:  Earlier exam of 05/21/2018 FINDINGS: Stable LEFT subclavian pacemaker and cardiac enlargement. Atherosclerotic calcification aorta. Persistent LEFT basilar opacity by atelectasis and effusion. No pneumothorax following thoracentesis. Remaining lungs clear. Bones demineralized. IMPRESSION: No pneumothorax following LEFT thoracentesis. Electronically Signed   By: Ulyses Southward M.D.   On: 05/21/2018 15:59   Dg Chest 1 View  Result Date: 05/21/2018 CLINICAL DATA:  Lateral radiograph requested.  Query pneumonia. EXAM: CHEST  1 VIEW COMPARISON:  AP radiograph done earlier. : The heart remains enlarged. Retrocardiac density likely effusion and atelectasis, no definite consolidation, is increased from 05/10/2018. IMPRESSION: Worsening aeration. Electronically Signed   By: Elsie Stain M.D.   On: 05/21/2018 12:48   Dg Chest 2 View  Result Date: 06/14/2018 CLINICAL DATA:  Shortness of breath. EXAM: CHEST - 2 VIEW COMPARISON:  Radiograph of June 07, 2018. FINDINGS: Stable cardiomegaly. Atherosclerosis of thoracic aorta is noted. Left-sided pacemaker is unchanged in position. Left-sided chest tube has been partially withdrawn, although distal tip appears to be within thoracic space. Mild left apical pneumothorax is now noted. Right lung is clear. Minimal bilateral pleural effusions are noted. Bony thorax is unremarkable. IMPRESSION: Mild left apical pneumothorax is noted. Left-sided chest tube appears to have been  partially withdrawn, although distal tip remains within the thoracic space. Minimal bilateral pleural effusions. Aortic Atherosclerosis (ICD10-I70.0). Electronically Signed   By: Lupita Raider, M.D.   On: 06/14/2018 14:04   Dg Chest 2 View  Result Date: 05/30/2018 CLINICAL DATA:  History of pleural effusions and status post pigtail drainage catheter placement to drain a left pleural effusion on 05/27/2018. EXAM: CHEST - 2 VIEW COMPARISON:  Portable chest x-ray on 05/29/2018 FINDINGS: The left basilar pleural drainage catheter is partially visualized on the frontal projection. In the lateral projection, good visualization posterior sulcus although left supports likely adequate pleural fluid drainage with no significant pleural fluid. There probably is some component a small amount of pleural fluid on the right. No overt edema present. No pneumothorax. Stable cardiac enlargement. Stable right upper extremity PICC line positioning with the catheter tip in the distal SVC. Stable appearance of pacemaker. IMPRESSION: Based on the lateral chest x-ray, there likely is not a significant amount of residual left pleural fluid after pigtail catheter drainage. A small amount of pleural fluid is suspected on the right. No overt edema. Electronically Signed   By: Irish Lack M.D.   On: 05/30/2018 10:10   Ct Head Wo Contrast  Result Date: 05/22/2018 CLINICAL DATA:  84 y/o F; Altered level of consciousness (LOC), unexplained. EXAM: CT HEAD WITHOUT CONTRAST TECHNIQUE: Contiguous axial images were obtained from the base of the skull through the vertex without intravenous contrast. COMPARISON:  06/21/2016 CT head FINDINGS: Brain: No evidence of acute infarction, hemorrhage, hydrocephalus, extra-axial collection or mass lesion/mass effect. Mild chronic microvascular ischemic changes and volume loss of the brain for age. Vascular: Calcific atherosclerosis of carotid siphons. No hyperdense vessel identified. Skull: Normal.  Negative for fracture or focal lesion. Sinuses/Orbits: No acute finding. Other: None. IMPRESSION: 1. No acute intracranial abnormality identified. 2. Mild chronic microvascular ischemic changes and volume loss of the brain for age. Electronically Signed   By: Micah Noel  [place]-Stratton M.D.   On: 05/22/2018 23:45   Ct Chest Wo Contrast  Result Date: 05/24/2018 CLINICAL DATA:  Follow-up pleural effusion. EXAM: CT CHEST WITHOUT CONTRAST TECHNIQUE: Multidetector CT imaging of the chest was performed following the standard protocol without IV contrast. COMPARISON:  Chest x-ray dated 05/23/2018. Chest CT dated 08/31/2003. FINDINGS: Cardiovascular: Cardiomegaly. No pericardial effusion. Extensive aortic atherosclerosis. No thoracic aortic aneurysm. Three-vessel coronary artery calcifications. Mediastinum/Nodes: No mass or enlarged lymph nodes seen within the mediastinum or perihilar regions. Esophagus is unremarkable. Trachea and central bronchi are unremarkable. Lungs/Pleura: Bilateral pleural effusions, small to moderate in size, RIGHT greater than LEFT. Chronic scarring/atelectasis at the LEFT lung base. Chronic pleural based nodule along the lateral margin of the RIGHT lung, near the junction of the major and minor fissures. No evidence of pneumonia. No pneumothorax. Upper Abdomen: No acute findings. Musculoskeletal: Degenerative changes throughout the slightly scoliotic thoracolumbar spine, mild to moderate in degree. No acute or suspicious osseous finding. Presumed chronic sebaceous cyst within the superficial soft tissues of the lower RIGHT back. Superficial soft tissues of the chest are otherwise unremarkable. IMPRESSION: 1. Bilateral pleural effusions, small to moderate in size, RIGHT greater than LEFT, with associated atelectasis. 2. No other acute appearing findings. 3. Cardiomegaly. 4. Diffuse coronary artery calcifications. Aortic Atherosclerosis (ICD10-I70.0). Electronically Signed   By: Bary Richard M.D.    On: 05/24/2018 13:43   US Venous Img Lower Bilateral  Result Date: 05/22/2018 CLINICAL DATA:  84 year old female with bilateral lower extremity pain and shortness of breath EXAM: BILATERAL LOWER EXTREMITY VENOUS DOPPLER ULTRASOUND TECHNIQUE: Gray-scale sonography with graded compression, as well as color Doppler and duplex ultrasound were performed to evaluate the lower extremity deep venous systems from the level of the common femoral vein and including the common femoral, femoral, profunda femoral, popliteal and calf veins including the posterior tibial, peroneal and gastrocnemius veins when visible. The superficial great saphenous vein was also interrogated. Spectral Doppler was utilized to evaluate flow at rest and with distal augmentation maneuvers in the common femoral, femoral and popliteal veins. COMPARISON:  None. FINDINGS: RIGHT LOWER EXTREMITY Common Femoral Vein: No evidence of thrombus. Normal compressibility, respiratory phasicity and response to augmentation. Saphenofemoral Junction: No evidence of thrombus. Normal compressibility and flow on color Doppler imaging. Profunda Femoral Vein: No evidence of thrombus. Normal compressibility and flow on color Doppler imaging. Femoral Vein: No evidence of thrombus. Normal compressibility, respiratory phasicity and response to augmentation. Popliteal Vein: No evidence of thrombus. Normal compressibility, respiratory phasicity and response to augmentation. Calf Veins: No evidence of thrombus. Normal compressibility and flow on color Doppler imaging. Superficial Great Saphenous Vein: No evidence of thrombus. Normal compressibility. Venous Reflux:  None. Other Findings:  None. LEFT LOWER EXTREMITY Common Femoral Vein: No evidence of thrombus. Normal compressibility, respiratory phasicity and response to augmentation. Saphenofemoral Junction: No evidence of thrombus. Normal compressibility and flow on color Doppler imaging. Profunda Femoral Vein: No evidence  of thrombus. Normal compressibility and flow on color Doppler imaging. Femoral Vein: No evidence of thrombus. Normal compressibility, respiratory phasicity and response to augmentation. Popliteal Vein: No evidence of thrombus. Normal compressibility, respiratory phasicity and response to augmentation. Calf Veins: No evidence of thrombus. Normal compressibility and flow on color Doppler imaging. Superficial Great Saphenous Vein: No evidence of thrombus. Normal compressibility. Venous Reflux:  None. Other Findings:  None. IMPRESSION: 1. No evidence of deep venous thrombosis in either lower extremity. 2. Increased pulsatility of the venous waveforms bilaterally. Similar findings can be seen in the setting  of elevated right heart pressures. Common differential considerations include tricuspid regurgitation, right-sided heart failure, pulmonary arterial hypertension and long-standing COPD. Electronically Signed   By: Malachy Moan M.D.   On: 05/22/2018 12:45   Dg Chest Port 1 View  Result Date: 06/07/2018 CLINICAL DATA:  Pulmonary edema. EXAM: PORTABLE CHEST 1 VIEW COMPARISON:  Eighteen 2019 and prior radiographs FINDINGS: Cardiomegaly, RIGHT PICC line with tip overlying the LOWER SVC, LEFT pacemaker and LEFT thoracostomy tube again noted. Decreased pulmonary edema identified with improved bilateral LOWER lung aeration. There is no evidence of pneumothorax. No acute bony abnormalities are present. IMPRESSION: Decreased pulmonary edema with improved bilateral LOWER lung aeration. Electronically Signed   By: Harmon Pier M.D.   On: 06/07/2018 08:26   Dg Chest Port 1 View  Result Date: 06/06/2018 CLINICAL DATA:  Pleural effusion EXAM: PORTABLE CHEST 1 VIEW COMPARISON:  06/04/2018 FINDINGS: Worsening bilateral airspace disease. Progressive bilateral effusions with bibasilar atelectasis. Dual lead pacemaker unchanged. Left pleural pigtail catheter remains in place in the base. No pneumothorax. Right sided PICC tip  in the SVC unchanged. IMPRESSION: Worsening diffuse bilateral airspace disease and bilateral effusions consistent with heart failure and edema. Pigtail chest tube on the left  without pneumothorax. Electronically Signed   By: Marlan Palau M.D.   On: 06/06/2018 08:39   Dg Chest Port 1 View  Result Date: 05/29/2018 CLINICAL DATA:  Follow-up chest x-ray.  Pleural effusion. EXAM: PORTABLE CHEST 1 VIEW COMPARISON:  May 23, 2018 FINDINGS: Stable cardiomegaly. Bilateral pleural effusions with underlying opacities, worsened in the interval. Stable right PICC line. No other changes. IMPRESSION: Increasing bilateral pleural effusions with underlying opacities. Electronically Signed   By: Gerome Sam III M.D   On: 05/29/2018 08:17   Dg Chest Port 1 View  Result Date: 05/23/2018 CLINICAL DATA:  Status post PICC line placement EXAM: PORTABLE CHEST 1 VIEW COMPARISON:  05/23/2018 FINDINGS: Cardiac shadow is stable. Pacing device is again seen. New right-sided PICC line is noted at the cavoatrial junction. Bilateral pleural effusions are noted. IMPRESSION: PICC line in satisfactory position. The remainder of the exam is stable. Electronically Signed   By: Alcide Clever M.D.   On: 05/23/2018 12:04   Dg Chest Port 1 View  Result Date: 05/23/2018 CLINICAL DATA:  Hypoxia. EXAM: PORTABLE CHEST 1 VIEW COMPARISON:  Radiographs 2 days ago 05/21/2018 FINDINGS: Left-sided pacemaker remains in place. Increasing [place] bibasilar opacity likely combination of pleural fluid and atelectasis. The heart is enlarged. Unchanged mediastinal contours from prior exam. Increasing vascular congestion. No pneumothorax. IMPRESSION: Increasing hazy opacity at the lung bases, left greater than right, likely pleural fluid and adjacent atelectasis. Increasing vascular congestion. Electronically Signed   By: Rubye Oaks M.D.   On: 05/23/2018 06:52   Dg Chest Portable 1 View  Result Date: 05/21/2018 CLINICAL DATA:  Short of breath and  weakness. EXAM: PORTABLE CHEST 1 VIEW COMPARISON:  05/10/2018 FINDINGS: Cardiac enlargement with dual lead pacemaker. Negative for heart failure. Mild left lower lobe airspace disease and probable small left effusion unchanged from the prior study. Right lung remains clear. IMPRESSION: Mild left lower lobe airspace disease and probable left effusion unchanged. This area is difficult to evaluate on portable image given the large cardiac silhouette. Electronically Signed   By: Marlan Palau M.D.   On: 05/21/2018 11:45   Ir Perc Pleural Drain W/indwell Cath W/img Guide  Result Date: 05/27/2018 CLINICAL DATA:  Left empyema with residual effusion. Drainage requested. EXAM: INSERTION OF  PLEURAL DRAINAGE  CATHETER ANESTHESIA/SEDATION: Intravenous Fentanyl and Versed were administered as conscious sedation during continuous monitoring of the patient's level of consciousness and physiological / cardiorespiratory status by the radiology RN, with a total moderate sedation time of 6 minutes. MEDICATIONS: No periprocedural antibiotics indicated FLUOROSCOPY TIME:  6 seconds; 1 mGy PROCEDURE: The procedure, risks, benefits, and alternatives were explained to the patient. Questions regarding the procedure were encouraged and answered. The patient understands and consents to the procedure. Under ultrasound, an appropriate skin entry site to approach the left pleural effusion was localized and marked. The left chest wall was prepped with Betadine in a sterile fashion, and a sterile drape was applied covering the operative field. A sterile gown and sterile gloves were used for the procedure. Local anesthesia was provided with 1% Lidocaine. Ultrasound image documentation was performed. Fluoroscopy during the procedure and fluoroscopic spot radiograph confirms appropriate catheter position. After creating a small skin incision, a 19 gauge needle was advanced into the pleural cavity under ultrasound guidance. A guide wire was then  advanced under fluoroscopy into the pleural space. Guidewire position confirmed under fluoroscopy. Pleural access was dilated serially and a 12 French pigtail catheter was placed. Catheter was connected to externally [place] Pleur-evac device. Catheter was secured externally with 0 Prolene suture and covered with Vaseline gauze and sterile gauze dressing. Fluoroscopic spot image confirms good catheter position. The patient tolerated the procedure well. COMPLICATIONS: None. FINDINGS: Residual pleural effusion at the left lung base was confirmed with ultrasound. 12 French pigtail drain catheter placed as above. IMPRESSION: 1. Technically successful left pleural drain placement. Electronically Signed   By: Corlis Leak M.D.   On: 05/27/2018 16:18   US Thoracentesis Asp Pleural Space W/img Guide  Result Date: 05/21/2018 INDICATION: LEFT pleural effusion EXAM: ULTRASOUND GUIDED DIAGNOSTIC LEFT THORACENTESIS MEDICATIONS: None. COMPLICATIONS: None immediate. PROCEDURE: Procedure, benefits, and risks of procedure were discussed with patient. Written informed consent for procedure was obtained. Time out protocol followed. Pleural effusion localized by ultrasound at the posterior LEFT hemithorax. Skin prepped and draped in usual sterile fashion. Skin and soft tissues anesthetized with 10 ML of 1% lidocaine. 8 French thoracentesis catheter placed into the LEFT pleural space. 300 mL of clear serous fluid aspirated by syringe pump. Procedure tolerated well by patient without immediate complication. FINDINGS: A total of approximately 300 mL of LEFT pleural fluid was removed. Samples were sent to the laboratory as requested by the clinical team. IMPRESSION: Successful ultrasound guided LEFT thoracentesis yielding 300 mL of pleural fluid. Electronically Signed   By: Ulyses Southward M.D.   On: 05/21/2018 15:57    Assessment/Plan  #1--history of trauma to chest tube-again she did go to the ER where x-ray showed minimal  displacement-and a very small apical pneumothorax- chest x-ray today did not show any changes- recommendation to contact Dr. Juanetta Gosling pulmonologist and nursing staff is checking on this- at this point clinically appears to be stable does have a small amount of drainage from the tube   CHF-this appears stable she does have quite mild edema today again weights have been ordered for Monday Wednesday Friday appear to have been stable--she is on a low-dose beta-blocker dose Lasix 40 mg twice daily updated labs are ordered for later this week Currently denies any shortness of breath  #3 history COPD this appears stable she is on chronic oxygen as well as BiPAP today.  4-- atrial fibrillation this appears rate controlled as well as Eliquis for anticoagulation.  5.-  Hypertension she continues on low-dose  beta-blocker and she is on this for CHF as well as atrial fibrillation- she also is on Lasix-will order orthostatic blood pressures daily with log for provider review-- blood pressure appears to be stable per manual readings that I have recently taken   but occasional somewhat lower systolics by machine  At times she will report some very transitory dizziness with therapy Will monitor clinically and await results of orthostatics  6 history of diabetes type 2 thought to be diet controlled CBGs have ranged from 113 up to 162 at this point will monitor   [id number]

## 2018-06-16 ENCOUNTER — Encounter: Payer: Self-pay | Admitting: Internal Medicine

## 2018-06-16 ENCOUNTER — Non-Acute Institutional Stay (SKILLED_NURSING_FACILITY): Payer: Medicare Other | Admitting: Internal Medicine

## 2018-06-16 DIAGNOSIS — G9341 Metabolic encephalopathy: Secondary | ICD-10-CM | POA: Diagnosis not present

## 2018-06-16 DIAGNOSIS — J9612 Chronic respiratory failure with hypercapnia: Secondary | ICD-10-CM

## 2018-06-16 DIAGNOSIS — J9611 Chronic respiratory failure with hypoxia: Secondary | ICD-10-CM

## 2018-06-16 DIAGNOSIS — J869 Pyothorax without fistula: Secondary | ICD-10-CM

## 2018-06-16 NOTE — Assessment & Plan Note (Signed)
06/16/2018 serial chest x-rays reveal less than 5% left apical pneumothorax and mild left lower lobe atelectasis.  Fluid in the collection device has appearance of concentrated urine.  There is approximately 150 cc of fluid which has been collected over the last 6 days. Dr. Juanetta GoslingHawkins feels that the pigtail catheter can be removed without complication by IR.

## 2018-06-16 NOTE — Assessment & Plan Note (Signed)
06/16/2018 patient is alert and oriented and provides excellent history

## 2018-06-16 NOTE — Assessment & Plan Note (Signed)
06/16/2018 patient is subjectively back to her baseline.  Adequate O2 sats on supplemental oxygen. Continue pulmonary toilet.

## 2018-06-16 NOTE — Progress Notes (Signed)
Provider:  Marga Melnick MD Location:    [facility] Room Number: 159/P Place of Service:  SNF ([phone])  PCP: Benita Stabile, MD Patient Care Team: Benita Stabile, MD as PCP - General (Internal Medicine) Jonelle Sidle, MD as PCP - Cardiology (Cardiology) Jena Gauss Gerrit Friends, MD as Consulting Physician (Gastroenterology)  Extended Emergency Contact Information Primary Emergency Contact: Lurlean Horns, [place] [address] [address] Home Phone: [phone] Mobile Phone: [phone] Relation: Daughter Secondary Emergency Contact: Dixon,Lou Lindi [place] of [place] Mobile Phone: [phone] Relation: None  Code Status: Full Code Goals of Care: Advanced Directive information Advanced Directives 06/16/2018  Does Patient Have a Medical Advance Directive? Yes  Type of Advance Directive (No Data)  Does patient want to make changes to medical advance directive? No - Patient declined  Copy of Healthcare Power of Attorney in Chart? -  Would patient like information on creating a medical advance directive? No - Patient declined    Chief Complaint  Patient presents with  . New Admit To SNF    New Admission Visit    HPI: Patient is a 84 y.o. Page seen today for admission to [facility] SNF.  The patient had been hospitalized 8/2-8/22/2019, admitted with dyspnea present 1 month but significantly worse over 2-3 days prior to admission.   She had been seen in the ED 7/13 and discharged after treatment with steroids and nebulizers for presumed COPD exacerbation.  She saw her PCP who diagnosed pneumonia. Despite antibiotics there was no improvement, prompting the ED visit 8/2.  Chest x-ray revealed a left pleural effusion.  Thoracentesis 8/2 resulted in the removal of 300 cc of fluid.  The fluid contained greater than 1000 white blood cells and culture revealed coag negative staph and actinomyces. She received intravenous vancomycin and Rocephin with  subsequent transition to oral doxycycline.  The full antibiotic course was completed in the hospital.   IR placed a pigtail catheter 8/8 after thoracic surgery consult determined thoracotomy was not clinically indicated.  Placement of the catheter significantly improved her symptoms. The catheter was to be maintained until total drainage each day was less than 10-20 cc.  IR was to remove the catheter at that time. The patient has chronic respiratory failure and is on 2.5 L of oxygen.  Her acute respiratory failure episode was felt to be related to the pneumonia, congestive heart failure, and COPD.  BiPAP was continued at night. She was found to have acute on chronic combined congestive heart failure.  Echocardiogram 8/3 revealed ejection fraction of 45%.  Volume status improved with IV Lasix.  She was transitioned to Lasix 40 mg twice daily, she had been on 40 mg once daily prior to admission. She did exhibit acute metabolic encephalopathy due to the hypercarbic respiratory failure and the pneumonia.  With aggressive therapy as noted above there was significant improvement in her mental status and she was felt to be back at baseline at the time of discharge. History includes paroxysmal atrial fibrillation.  Heart rate was controlled.  She remained on Eliquis.  She has a history of coronary disease but exhibited no significant anginal symptoms.  Her diabetes was felt to be controlled.  She has acute on chronic kidney disease stage III.  Creatinine peaked to 1.67 but trended down to baseline. The catheter became disconnected 8/26 and she was seen in the ED.  The chest tube was still in the  thorax but had moved slightly.  There was a small left apical pneumothorax less than 5% as well as mild left lower lobe atelectasis.  Dr. Juanetta Gosling was consulted by ED physician.  He requested the catheter be left in place and suction applied at the SNF if possible.  The film was repeated 8/27 and revealed no change in the less  than 5% left apical pneumothorax. This morning she noted her shirt was damp, staff noted that the pigtail catheter had become disconnected again. Dr. Juanetta Gosling was contacted to discuss whether to remove the pigtail catheter in IR & he is in agreement.  IR was contacted, but they cannot remove the catheter until 8/29.  It will be sealed with a sterile dressing and taped securely.  Should she have left pleuritic pain or increasing shortness of breath, ED referral will be made.  Past Medical History:  Diagnosis Date  . Asthma   . COPD (chronic obstructive pulmonary disease) (HCC)    Oxygen dependent  . Coronary atherosclerosis of native coronary artery   . Essential hypertension, benign   . History of GI bleed    Ulcer  . Hyperlipidemia   . Old inferior wall myocardial infarction 1999   [facility]  . PAF (paroxysmal atrial fibrillation) (HCC)   . Sinus node dysfunction (HCC)    [employer] PPM  . Type 2 diabetes mellitus (HCC)    Past Surgical History:  Procedure Laterality Date  . Cesareen section    . CHOLECYSTECTOMY    . COLONOSCOPY  09/2004   RMR: For obscure GI bleed. Left-sided diverticula, otherwise normal ileocolonoscopy  . Coronary artery stent  1999   BMS x 3 RCA  ([facility]), PTCRA RC4  . EP IMPLANTABLE DEVICE N/A 06/26/2016   Procedure: Pacemaker Implant;  Surgeon: Will Jorja Loa, MD;  Location: [facility] INVASIVE CV LAB;  Service: Cardiovascular;  Laterality: N/A;  . ESOPHAGOGASTRODUODENOSCOPY  Kathy 2005   RMR: For melena, normal exam.  . EXPLORATORY LAPAROTOMY    . GIVENS CAPSULE STUDY  Kathy/2005   official report unavailable, but reported couple of AVMs, nonbleeding  . IR PERC PLEURAL DRAIN W/INDWELL CATH W/IMG GUIDE  05/27/2018  . PARTIAL COLECTOMY  1977   blockage/gangrene? unclear if small bowel or colon  . THORACENTESIS Left 03/10/16   1 L transudative fluid    reports that she quit smoking about 20 years ago. Her smoking use included cigarettes. She has a 80.00 pack-year smoking  history. She has never used smokeless tobacco. She reports that she does not drink alcohol or use drugs.  Family History  Problem Relation Age of Onset  . Diabetes Mother   . Diabetes Father   . Heart disease Father   . Cancer Sister        unknown  . Cancer Brother        unknown  . Diabetes Brother   . Colon cancer Neg Hx     Allergies  Allergen Reactions  . Codeine Nausea Only  . Heparin Other (See Comments)    Causes internal bleeding     Outpatient Encounter Medications as of 06/16/2018  Medication Sig  . apixaban (ELIQUIS) 5 MG TABS tablet Take 1 tablet (5 mg total) by mouth 2 (two) times daily.  . bisoprolol (ZEBETA) 5 MG tablet Take 0.5 tablets (2.5 mg total) by mouth daily. Pt needs appointment for further refills.  Marland Kitchen escitalopram (LEXAPRO) 20 MG tablet Take 20 mg by mouth at bedtime.   . furosemide (LASIX) 40 MG tablet  Take 1 tablet (40 mg total) by mouth 2 (two) times daily. Daily at 0800 am  . ipratropium-albuterol (DUONEB) 0.5-2.5 (3) MG/3ML SOLN Take 3 mLs by nebulization 3 (three) times daily.  Marland Kitchen LORazepam (ATIVAN) 0.5 MG tablet Take 1 tablet (0.5 mg total) by mouth at bedtime. Take one tablet by mouth once daily  . Melatonin 10 MG TABS Take 1 tablet by mouth at bedtime.  . Multiple Vitamins-Minerals (CENTRUM SILVER 50+WOMEN PO) Take 1 tablet by mouth daily.  . nitroGLYCERIN (NITROSTAT) 0.4 MG SL tablet Place 1 tablet (0.4 mg total) under the tongue every 5 (five) minutes as needed for chest pain.  Marland Kitchen omeprazole (PRILOSEC) 20 MG capsule Take 1 capsule (20 mg total) by mouth 2 (two) times daily before a meal.  . potassium chloride (K-DUR,KLOR-CON) 10 MEQ tablet Take 10 mEq by mouth 2 (two) times daily.  Marland Kitchen triamcinolone cream (KENALOG) 0.1 % Apply 1 application topically 2 (two) times daily. Apply to fungal rash of bilateral buttocks and sacrum for 10 days from 06/11/2018-06/20/2018  . vitamin B-12 (CYANOCOBALAMIN) 1000 MCG tablet Take 1,000 mcg by mouth daily.   No  facility-administered encounter medications on file as of 06/16/2018.    Review of Systems : She feels that her breathing is back to baseline.  She denies any active cardiopulmonary symptoms.  She describes generalized weakness. Her only other complaint is depression.  She states this has been treated.  Constitutional: No fever, chills, significant weight change, or night sweats Eyes: No redness, discharge, pain, blurred vision, double vision, or loss of vision ENT/mouth: No nasal congestion, postnasal drainage,epistaxis, purulent discharge, earache, hearing loss, tinnitus ,sore throat , dental pain, or hoarseness   Cardiovascular: no chest pain, palpitations, racing, irregular rhythm, syncope, nausea, sweating, claudication, or edema  Respiratory: No cough, sputum production,hemoptysis,   paroxysmal nocturnal dyspnea, pleuritic chest pain, significant snoring, or  apnea    Gastrointestinal: No heartburn,dysphagia, nausea and vomiting,ominal pain, change in bowels, anorexia, diarrhea, significant constipation, rectal bleeding, melena,  stool incontinence or jaundice Genitourinary: No dysuria,hematuria, pyuria, frequency, urgency,  incontinence, nocturia, dark urine or flank pain Musculoskeletal: No myalgias or muscle cramping, joint stiffness, joint swelling, joint color change, weakness, or cyanosis Dermatologic: No rash, pruritus, urticaria, or change in color or temperature of skin Neurologic: No headache, vertigo,  tremor, gait disturbance, seizures, memory loss, numbness or tingling Endocrine: No change in hair/skin/ nails, excessive thirst, excessive hunger, excessive urination, or unexplained fatigue Hematologic/lymphatic: No bruising, lymphadenopathy,or  abnormal clotting Allergy/immunology: No itchy/ watery eyes, abnormal sneezing, rhinitis, urticaria ,or angioedema  Vitals:   06/16/18 0904  BP: 130/72  Pulse: 75  Resp: 18  Temp: 97.6 F (36.4 C)  TempSrc: Oral   There is no  height or weight on file to calculate BMI.   Physical Exam: Positive findings: She is hard of hearing.  Arcus senilis is present.  She is wearing nasal oxygen.  She has complete dentures.  Heart sounds are distant and slightly irregular.  Breath sounds are decreased generally with minimal left lower lobe rales.  She has 1/2+ edema at the sock line.  Pedal pulses are decreased.  She is symmetrically weak in all extremities.  She has small excoriations and abrasions over the forearms.  The left posterior thorax is dressed.  The drainage tube has become disconnected.  The collection box has 150 cc of yellow/orange-colored fluid which looks like concetrated urine.  General appearance: Adequately nourished; no acute distress, increased work of breathing is present.  Lymphatic: No lymphadenopathy about the head, neck, axilla. Eyes: No conjunctival inflammation or lid edema is present. There is no scleral icterus. Ears:  External ear exam shows no significant lesions or deformities.   Nose:  External nasal examination shows no deformity or inflammation. Nasal mucosa are pink and moist without lesions, exudates Oral exam:  Lips and gums are healthy appearing. There is no oropharyngeal erythema or exudate. Neck:  No thyromegaly, masses, tenderness noted.    Heart:  No gallop, murmur, click, rub .  Lungs:  without wheezes, rhonchi, rubs. Abdomen: Bowel sounds are normal. Abdomen is soft and nontender with no organomegaly, hernias, masses. GU: Deferred  Extremities:  No cyanosis, clubbing  Neurologic exam : Balance, Rhomberg, finger to nose testing could not be completed due to clinical state Skin: Warm & dry w/o tenting. No significant  rash.  Labs reviewed: Basic Metabolic Panel: Recent Labs    06/06/18 0623 06/07/18 0921 06/11/18 0615  NA 136 137 136  K 4.0 3.7 3.9  CL 94* 89* 93*  CO2 34* 37* 34*  GLUCOSE 96 158* 150*  BUN 30* 29* 29*  CREATININE 0.84 0.89 1.03*  CALCIUM 8.5* 9.0 8.6*    MG 2.1  --   --    Liver Function Tests: Recent Labs    05/23/18 0600 05/26/18 0412 06/07/18 0921  AST 15 14* 22  ALT 18 17 48*  ALKPHOS 42 32* 46  BILITOT 0.7 0.8 0.8  PROT 6.5 5.2* 5.7*  ALBUMIN 3.8 2.9* 3.2*   No results for input(s): LIPASE, AMYLASE in the last 8760 hours. Recent Labs    05/23/18 0600  AMMONIA 22   CBC: Recent Labs    05/21/18 1129 05/23/18 0600  06/05/18 0709 06/06/18 0623 06/11/18 0615  WBC 8.8 9.6   < > 9.3 8.2 9.5  NEUTROABS 6.4 8.0*  --   --   --  7.3  HGB 10.2* 9.4*   < > 9.0* 8.3* 9.9*  HCT 35.0* 34.5*   < > 28.8* 27.8* 33.2*  MCV 97.8 102.4*   < > 90.9 91.1 88.5  PLT 200 223   < > 251 224 205   < > = values in this interval not displayed.   Cardiac Enzymes: Recent Labs    05/01/18 0548 05/21/18 1129 05/23/18 0600  TROPONINI <0.03 <0.03 <0.03   BNP: Invalid input(s): POCBNP Lab Results  Component Value Date   HGBA1C 6.0 (H) 05/22/2018   Lab Results  Component Value Date   TSH 2.866 05/21/2018   Lab Results  Component Value Date   VITAMINB12 234 04/07/2016   Lab Results  Component Value Date   FOLATE 13.7 04/07/2016   Lab Results  Component Value Date   IRON 28 04/07/2016   TIBC 335 04/07/2016   FERRITIN 20 04/07/2016    Imaging and Procedures obtained prior to SNF admission: Dg Chest 2 View  Result Date: 06/15/2018 CLINICAL DATA:  Replacement of left chest tube. EXAM: CHEST - 2 VIEW COMPARISON:  06/14/2018 FINDINGS: Dual lead pacemaker as seen previously. Left pigtail thoracostomy tube in place in the left lower chest. Small left apical pneumothorax, the same size as yesterday, 5% or less. Mild left base atelectasis as previously seen. IMPRESSION: No radiographic change since yesterday. Small left apical pneumothorax, 5% or less. Mild left base atelectasis. Electronically Signed   By: Paulina FusiMark  Shogry M.D.   On: 06/15/2018 15:34   Assessment/Plan: See summary under each active problem in the Problem List with  associated updated therapeutic plan

## 2018-06-16 NOTE — Patient Instructions (Signed)
See assessment and plan under each diagnosis in the problem list and acutely for this visit 

## 2018-06-17 ENCOUNTER — Ambulatory Visit (HOSPITAL_COMMUNITY): Payer: Medicare Other | Attending: Radiology

## 2018-06-17 ENCOUNTER — Other Ambulatory Visit: Payer: Self-pay | Admitting: Radiology

## 2018-06-17 ENCOUNTER — Inpatient Hospital Stay (HOSPITAL_COMMUNITY): Payer: Medicare Other | Attending: Internal Medicine

## 2018-06-17 DIAGNOSIS — J90 Pleural effusion, not elsewhere classified: Secondary | ICD-10-CM

## 2018-06-17 DIAGNOSIS — Z9889 Other specified postprocedural states: Secondary | ICD-10-CM | POA: Diagnosis not present

## 2018-06-17 DIAGNOSIS — J939 Pneumothorax, unspecified: Secondary | ICD-10-CM | POA: Diagnosis not present

## 2018-06-17 DIAGNOSIS — J869 Pyothorax without fistula: Secondary | ICD-10-CM | POA: Diagnosis not present

## 2018-06-17 NOTE — Progress Notes (Signed)
Patient ID: Kathy CuriaVirginia L Page, female   DOB: 05/11/1931, 84 y.o.   MRN: [id number]   Left pleural drain placed 05/27/2018 for empyema [facility]  [facility] to drain [facility]  8/22 with Pleurvac disconnected and drain placed to bag per notes Has been in [facility] since then Minimal OP from drain for days Pt is without fever or chills  CXR 8/26 revealed small PTX CXR 8/27 same  Request for removal per Dr Alwyn RenHopper and Dr Juanetta GoslingHawkins per notes  CXR this am shows same to possibly larger PTX per Dr Grace IsaacWatts  Upon examination of pt: Pigtail catheter has been broken End of drain is exposed to air No bag or pleurvac is connected to end of drain Distal end of tubing cannot be connected to syringe or any other device End is broken and without connector  Site is clean and dry NT no bleeding no sign of infection  Pt is In NAD; states she is not SOB and without CP Able to stand with help Able to get to Xray table with help  Discussed with Dr Grace IsaacWatts Removed chest tube without complication vaseline dressing placed.  Pt knows to alert [facility] at [facility] if she becomes SOB or has pain Will need immediate CXR and evaluation

## 2018-06-18 ENCOUNTER — Encounter (HOSPITAL_COMMUNITY)
Admission: RE | Admit: 2018-06-18 | Discharge: 2018-06-18 | Disposition: A | Discharge status: Home / Self Care | Payer: Medicare Other | Source: Skilled Nursing Facility | Attending: Internal Medicine | Admitting: Internal Medicine

## 2018-06-18 DIAGNOSIS — E785 Hyperlipidemia, unspecified: Secondary | ICD-10-CM | POA: Insufficient documentation

## 2018-06-18 DIAGNOSIS — I130 Hypertensive heart and chronic kidney disease with heart failure and stage 1 through stage 4 chronic kidney disease, or unspecified chronic kidney disease: Secondary | ICD-10-CM | POA: Insufficient documentation

## 2018-06-18 DIAGNOSIS — E1129 Type 2 diabetes mellitus with other diabetic kidney complication: Secondary | ICD-10-CM | POA: Insufficient documentation

## 2018-06-18 LAB — COMPREHENSIVE METABOLIC PANEL
ALBUMIN: 3.2 g/dL — AB (ref 3.5–5.0)
ALK PHOS: 41 U/L (ref 38–126)
ALT: 13 U/L (ref 0–44)
AST: 13 U/L — ABNORMAL LOW (ref 15–41)
Anion gap: 7 (ref 5–15)
BUN: 24 mg/dL — AB (ref 8–23)
CHLORIDE: 98 mmol/L (ref 98–111)
CO2: 34 mmol/L — ABNORMAL HIGH (ref 22–32)
Calcium: 8.9 mg/dL (ref 8.9–10.3)
Creatinine, Ser: 1 mg/dL (ref 0.44–1.00)
GFR calc Af Amer: 58 mL/min — ABNORMAL LOW (ref >60)
GFR calc non Af Amer: 50 mL/min — ABNORMAL LOW (ref >60)
GLUCOSE: 113 mg/dL — AB (ref 70–99)
Potassium: 4.1 mmol/L (ref 3.5–5.1)
SODIUM: 139 mmol/L (ref 135–145)
Total Bilirubin: 0.7 mg/dL (ref 0.3–1.2)
Total Protein: 5.8 g/dL — ABNORMAL LOW (ref 6.5–8.1)

## 2018-06-18 LAB — CBC WITH DIFFERENTIAL/PLATELET
Basophils Absolute: 0 10*3/uL (ref 0.0–0.1)
Basophils Relative: 0 %
EOS ABS: 0.1 10*3/uL (ref 0.0–0.7)
EOS PCT: 3 %
HCT: 29.5 % — ABNORMAL LOW (ref 36.0–46.0)
HEMOGLOBIN: 8.7 g/dL — AB (ref 12.0–15.0)
Lymphocytes Relative: 26 %
Lymphs Abs: 1.4 10*3/uL (ref 0.7–4.0)
MCH: 26.2 pg (ref 26.0–34.0)
MCHC: 29.5 g/dL — ABNORMAL LOW (ref 30.0–36.0)
MCV: 88.9 fL (ref 78.0–100.0)
Monocytes Absolute: 0.6 10*3/uL (ref 0.1–1.0)
Monocytes Relative: 12 %
NEUTROS PCT: 59 %
Neutro Abs: 3.1 10*3/uL (ref 1.7–7.7)
PLATELETS: 168 10*3/uL (ref 150–400)
RBC: 3.32 MIL/uL — AB (ref 3.87–5.11)
RDW: 14.1 % (ref 11.5–15.5)
WBC: 5.2 10*3/uL (ref 4.0–10.5)

## 2018-06-22 ENCOUNTER — Other Ambulatory Visit: Payer: Self-pay | Admitting: *Deleted

## 2018-06-22 NOTE — Patient Outreach (Addendum)
[facility]) Care Management  06/22/2018  [name] 05-21-1931 [id number]  Met with [name],SW at facility.  She reports that has improved since being back from the hospital. She reports patient does live alone and hopes to return to that situation. Met with patient, her daughter [name], and her son-in-law.   Patient reports she was able to get up and walk today, she is better without the chest tube.  She reports she has several chronic conditions including HF, COPD, diet controlled DM.  Patient is on oxygen, she states she has a scale for daily weights. Patient reports her daughter is supportive and assists her with her medication management, meals, and transportation to appointments.   RNCM reviewed [facility] care management program services. Both patient and daughter feel this would be of benefit to patient upon returning home. [facility] packet left at bedside, with RNCM contact, verbal consent obtained. Patient feels she will be at the facility another couple of weeks.   Patient requests that daughter be contacted for transition of care: Daughter is [name]:  [phone], [phone]. Daughter works nights and requests early am calls before 9:30 or 10 am  Plan to refer to [place] program for Transition of care upon discharge.  Will collaborate with [facility] as indicated.   Royetta Crochet. Laymond Purser, RN, BSN, [place] [phone]) Business Cell  [phone]) Toll Free Office

## 2018-06-24 ENCOUNTER — Encounter (HOSPITAL_COMMUNITY)
Admission: RE | Admit: 2018-06-24 | Discharge: 2018-06-24 | Disposition: A | Discharge status: Home / Self Care | Payer: Medicare Other | Source: Skilled Nursing Facility | Attending: Internal Medicine | Admitting: Internal Medicine

## 2018-06-24 DIAGNOSIS — J449 Chronic obstructive pulmonary disease, unspecified: Secondary | ICD-10-CM | POA: Insufficient documentation

## 2018-06-24 DIAGNOSIS — J9611 Chronic respiratory failure with hypoxia: Secondary | ICD-10-CM | POA: Insufficient documentation

## 2018-06-24 DIAGNOSIS — I5043 Acute on chronic combined systolic (congestive) and diastolic (congestive) heart failure: Secondary | ICD-10-CM | POA: Insufficient documentation

## 2018-06-24 DIAGNOSIS — J189 Pneumonia, unspecified organism: Secondary | ICD-10-CM | POA: Insufficient documentation

## 2018-06-24 DIAGNOSIS — I130 Hypertensive heart and chronic kidney disease with heart failure and stage 1 through stage 4 chronic kidney disease, or unspecified chronic kidney disease: Secondary | ICD-10-CM | POA: Insufficient documentation

## 2018-06-24 LAB — CBC WITH DIFFERENTIAL/PLATELET
BASOS ABS: 0 10*3/uL (ref 0.0–0.1)
Basophils Relative: 1 %
EOS PCT: 4 %
Eosinophils Absolute: 0.2 10*3/uL (ref 0.0–0.7)
HEMATOCRIT: 28.7 % — AB (ref 36.0–46.0)
Hemoglobin: 8.5 g/dL — ABNORMAL LOW (ref 12.0–15.0)
Lymphocytes Relative: 27 %
Lymphs Abs: 1.1 10*3/uL (ref 0.7–4.0)
MCH: 25.8 pg — ABNORMAL LOW (ref 26.0–34.0)
MCHC: 29.6 g/dL — ABNORMAL LOW (ref 30.0–36.0)
MCV: 87 fL (ref 78.0–100.0)
MONO ABS: 0.4 10*3/uL (ref 0.1–1.0)
Monocytes Relative: 10 %
NEUTROS ABS: 2.5 10*3/uL (ref 1.7–7.7)
Neutrophils Relative %: 58 %
PLATELETS: 224 10*3/uL (ref 150–400)
RBC: 3.3 MIL/uL — ABNORMAL LOW (ref 3.87–5.11)
RDW: 14.4 % (ref 11.5–15.5)
WBC: 4.2 10*3/uL (ref 4.0–10.5)

## 2018-06-24 LAB — BASIC METABOLIC PANEL
ANION GAP: 8 (ref 5–15)
BUN: 26 mg/dL — ABNORMAL HIGH (ref 8–23)
CALCIUM: 8.8 mg/dL — AB (ref 8.9–10.3)
CO2: 32 mmol/L (ref 22–32)
Chloride: 98 mmol/L (ref 98–111)
Creatinine, Ser: 1.08 mg/dL — ABNORMAL HIGH (ref 0.44–1.00)
GFR, EST AFRICAN AMERICAN: 53 mL/min — AB (ref >60)
GFR, EST NON AFRICAN AMERICAN: 45 mL/min — AB (ref >60)
Glucose, Bld: 106 mg/dL — ABNORMAL HIGH (ref 70–99)
Potassium: 4 mmol/L (ref 3.5–5.1)
Sodium: 138 mmol/L (ref 135–145)

## 2018-06-29 ENCOUNTER — Other Ambulatory Visit (HOSPITAL_COMMUNITY)
Admission: AD | Admit: 2018-06-29 | Discharge: 2018-06-29 | Disposition: A | Discharge status: Home / Self Care | Payer: Medicare Other | Source: Skilled Nursing Facility | Attending: Internal Medicine | Admitting: Internal Medicine

## 2018-06-29 ENCOUNTER — Encounter: Payer: Self-pay | Admitting: Internal Medicine

## 2018-06-29 ENCOUNTER — Non-Acute Institutional Stay (SKILLED_NURSING_FACILITY): Payer: Medicare Other | Admitting: Internal Medicine

## 2018-06-29 DIAGNOSIS — J449 Chronic obstructive pulmonary disease, unspecified: Secondary | ICD-10-CM | POA: Diagnosis not present

## 2018-06-29 DIAGNOSIS — I5043 Acute on chronic combined systolic (congestive) and diastolic (congestive) heart failure: Secondary | ICD-10-CM

## 2018-06-29 DIAGNOSIS — J9601 Acute respiratory failure with hypoxia: Secondary | ICD-10-CM | POA: Diagnosis not present

## 2018-06-29 DIAGNOSIS — I481 Persistent atrial fibrillation: Secondary | ICD-10-CM

## 2018-06-29 DIAGNOSIS — J869 Pyothorax without fistula: Secondary | ICD-10-CM | POA: Diagnosis not present

## 2018-06-29 DIAGNOSIS — E1159 Type 2 diabetes mellitus with other circulatory complications: Secondary | ICD-10-CM | POA: Diagnosis not present

## 2018-06-29 DIAGNOSIS — J9602 Acute respiratory failure with hypercapnia: Secondary | ICD-10-CM

## 2018-06-29 DIAGNOSIS — I4819 Other persistent atrial fibrillation: Secondary | ICD-10-CM

## 2018-06-29 DIAGNOSIS — J9611 Chronic respiratory failure with hypoxia: Secondary | ICD-10-CM | POA: Diagnosis not present

## 2018-06-29 DIAGNOSIS — E1129 Type 2 diabetes mellitus with other diabetic kidney complication: Secondary | ICD-10-CM | POA: Insufficient documentation

## 2018-06-29 DIAGNOSIS — I10 Essential (primary) hypertension: Secondary | ICD-10-CM

## 2018-06-29 LAB — CBC WITH DIFFERENTIAL/PLATELET
BASOS ABS: 0 10*3/uL (ref 0.0–0.1)
Basophils Relative: 1 %
EOS PCT: 2 %
Eosinophils Absolute: 0.1 10*3/uL (ref 0.0–0.7)
HCT: 29.4 % — ABNORMAL LOW (ref 36.0–46.0)
Hemoglobin: 8.7 g/dL — ABNORMAL LOW (ref 12.0–15.0)
LYMPHS PCT: 31 %
Lymphs Abs: 1.7 10*3/uL (ref 0.7–4.0)
MCH: 25.4 pg — ABNORMAL LOW (ref 26.0–34.0)
MCHC: 29.6 g/dL — AB (ref 30.0–36.0)
MCV: 85.7 fL (ref 78.0–100.0)
MONO ABS: 0.6 10*3/uL (ref 0.1–1.0)
Monocytes Relative: 11 %
Neutro Abs: 3 10*3/uL (ref 1.7–7.7)
Neutrophils Relative %: 55 %
PLATELETS: 286 10*3/uL (ref 150–400)
RBC: 3.43 MIL/uL — ABNORMAL LOW (ref 3.87–5.11)
RDW: 14.8 % (ref 11.5–15.5)
WBC: 5.4 10*3/uL (ref 4.0–10.5)

## 2018-06-29 LAB — BASIC METABOLIC PANEL
ANION GAP: 7 (ref 5–15)
BUN: 23 mg/dL (ref 8–23)
CALCIUM: 8.8 mg/dL — AB (ref 8.9–10.3)
CO2: 31 mmol/L (ref 22–32)
CREATININE: 1.2 mg/dL — AB (ref 0.44–1.00)
Chloride: 99 mmol/L (ref 98–111)
GFR calc Af Amer: 46 mL/min — ABNORMAL LOW (ref >60)
GFR, EST NON AFRICAN AMERICAN: 40 mL/min — AB (ref >60)
Glucose, Bld: 114 mg/dL — ABNORMAL HIGH (ref 70–99)
Potassium: 3.9 mmol/L (ref 3.5–5.1)
Sodium: 137 mmol/L (ref 135–145)

## 2018-06-29 NOTE — Progress Notes (Signed)
Location:    [facility] Room Number: 159/P Place of Service:  SNF [phone])  Provider: Edmon Crape PA-C  PCP: Benita Stabile, MD Patient Care Team: Benita Stabile, MD as PCP - General (Internal Medicine) Jonelle Sidle, MD as PCP - Cardiology (Cardiology) Jena Gauss Gerrit Friends, MD as Consulting Physician (Gastroenterology) Juanell Fairly, RN as [facility] Management  Extended Emergency Contact Information Primary Emergency Contact: Lurlean Horns, [place] [address] [place] of [place] Home Phone: [phone] Mobile Phone: [phone] Relation: Daughter Secondary Emergency Contact: Dixon,Lou Lindi [place] of [place] Mobile Phone: [phone] Relation: None  Code Status: Full Code Goals of care:  Advanced Directive information Advanced Directives 06/29/2018  Does Patient Have a Medical Advance Directive? Yes  Type of Advance Directive (No Data)  Does patient want to make changes to medical advance directive? No - Patient declined  Copy of Healthcare Power of Attorney in Chart? -  Would patient like information on creating a medical advance directive? No - Patient declined     Allergies  Allergen Reactions  . Codeine Nausea Only  . Heparin Other (See Comments)    Causes internal bleeding     Chief Complaint  Patient presents with  . Discharge Note    Discharge Visit    HPI:  84 y.o. female seen today for discharge from facility tomorrow  She has been here for rehab and strengthening after a somewhat complicated hospitalization.  For respiratory failure thought multifactorial with pneumonia and combined congestive heart failure. Along with COPD exacerbation.  In the hospital she was diagnosed with pneumonia with an exudative pleural effusion that was treated with IV antibiotics vancomycin and Rocephin and transition to oral doxycycline which she completed in the facility.  She also had placement of a catheter  in her left chest by interventional radiology and this improved her symptoms.  This had fairly minimal drainage here at the facility- at one point appeared to come out and she was sent to the ER which showed minimal displacement and did show a small left apical pneumothorax-recommendation was to continue catheter and suction at facility if possible.  However shortly thereafter she did note her stomach was somewhat damp and pulmonology was contacted and gave orders to remove the catheter.  She has done quite well status post catheter.  On initial hospital admission she was thought to have hypercarbic respiratory failure and complicated with infection which led to confusion-she was treated with BiPAP nebulizers and appeared to do well her mental status has stabilized she is back at baseline.  Regards to CHF with ejection fraction of 45% per echo-she is on Lasix 40 mg twice daily she did receive IV Lasix in the hospital.  Her edema appears to be stable her weight has been relatively stable she has gained about 5 pounds during her stay here but I suspect her appetite has improved  She also has a history of chronic kidney disease this is been stable with creatinine of 1.03 on lab done on September 5 will update this before discharge.  Also appears she has an element of anemia recent hemoglobin's have run from the low eights to high nines-.  Generally run more in the 8 area it was 8.5 on lab done last week will update this before discharge as well.  Regards to atrial fibrillation this appears rate controlled on her beta-blocker she is on Eliquis for anticoagulation.  She also  has a history of sick sinus syndrome and has a pacemaker which is followed by cardiology.  She is also listed as a type II diabetic which is diet controlled her blood sugars have been stable largely in the lower 100s.  Today she has no complaints-she actually saw Dr. Juanetta Gosling of pulmonology this morning and thought to be doing  well apparently.  She denies any shortness of breath or cough vital signs are stable she is looking forward to going home.  She will need continued PT and OT for strengthening as well as nursing support for multiple medical issues-she does continue on chronic oxygen.         Past Medical History:  Diagnosis Date  . Asthma   . COPD (chronic obstructive pulmonary disease) (HCC)    Oxygen dependent  . Coronary atherosclerosis of native coronary artery   . Essential hypertension, benign   . History of GI bleed    Ulcer  . Hyperlipidemia   . Old inferior wall myocardial infarction 1999   [facility]  . PAF (paroxysmal atrial fibrillation) (HCC)   . Sinus node dysfunction (HCC)    [employer] PPM  . Type 2 diabetes mellitus (HCC)     Past Surgical History:  Procedure Laterality Date  . Cesareen section    . CHOLECYSTECTOMY    . COLONOSCOPY  09/2004   RMR: For obscure GI bleed. Left-sided diverticula, otherwise normal ileocolonoscopy  . Coronary artery stent  1999   BMS x 3 RCA  ([facility]), PTCRA RC4  . EP IMPLANTABLE DEVICE N/A 06/26/2016   Procedure: Pacemaker Implant;  Surgeon: Will Jorja Loa, MD;  Location: [facility] INVASIVE CV LAB;  Service: Cardiovascular;  Laterality: N/A;  . ESOPHAGOGASTRODUODENOSCOPY  11 2005   RMR: For melena, normal exam.  . EXPLORATORY LAPAROTOMY    . GIVENS CAPSULE STUDY  08/2004   official report unavailable, but reported couple of AVMs, nonbleeding  . IR PERC PLEURAL DRAIN W/INDWELL CATH W/IMG GUIDE  05/27/2018  . PARTIAL COLECTOMY  1977   blockage/gangrene? unclear if small bowel or colon  . THORACENTESIS Left 03/10/16   1 L transudative fluid      reports that she quit smoking about 20 years ago. Her smoking use included cigarettes. She has a 80.00 pack-year smoking history. She has never used smokeless tobacco. She reports that she does not drink alcohol or use drugs. Social History   Socioeconomic History  . Marital status: Widowed    Spouse name:  Not on file  . Number of children: 2  . Years of education: Not on file  . Highest education level: Not on file  Occupational History  . Not on file  Social Needs  . Financial resource strain: Not on file  . Food insecurity:    Worry: Not on file    Inability: Not on file  . Transportation needs:    Medical: Not on file    Non-medical: Not on file  Tobacco Use  . Smoking status: Former Smoker    Packs/day: 2.00    Years: 40.00    Pack years: 80.00    Types: Cigarettes    Last attempt to quit: 01/13/1998    Years since quitting: 20.4  . Smokeless tobacco: Never Used  Substance and Sexual Activity  . Alcohol use: No    Alcohol/week: 0.0 standard drinks  . Drug use: No  . Sexual activity: Not on file  Lifestyle  . Physical activity:    Days per week: Not on file  Minutes per session: Not on file  . Stress: Not on file  Relationships  . Social connections:    Talks on phone: Not on file    Gets together: Not on file    Attends religious service: Not on file    Active member of club or organization: Not on file    Attends meetings of clubs or organizations: Not on file    Relationship status: Not on file  . Intimate partner violence:    Fear of current or ex partner: Not on file    Emotionally abused: Not on file    Physically abused: Not on file    Forced sexual activity: Not on file  Other Topics Concern  . Not on file  Social History Narrative  . Not on file   Functional Status Survey:    Allergies  Allergen Reactions  . Codeine Nausea Only  . Heparin Other (See Comments)    Causes internal bleeding     Pertinent  Health Maintenance Due  Topic Date Due  . INFLUENZA VACCINE  07/15/2018 (Originally 05/20/2018)  . FOOT EXAM  07/15/2018 (Originally 04/28/1941)  . OPHTHALMOLOGY EXAM  07/15/2018 (Originally 04/28/1941)  . URINE MICROALBUMIN  07/15/2018 (Originally 04/28/1941)  . DEXA SCAN  07/15/2018 (Originally 04/27/1998)  . PNA vac Low Risk Adult (1 of 2 - PCV13)  07/15/2018 (Originally 04/27/1998)  . HEMOGLOBIN A1C  11/22/2018    Medications: Outpatient Encounter Medications as of 06/29/2018  Medication Sig  . apixaban (ELIQUIS) 5 MG TABS tablet Take 1 tablet (5 mg total) by mouth 2 (two) times daily.  . bisoprolol (ZEBETA) 5 MG tablet Take 0.5 tablets (2.5 mg total) by mouth daily. Pt needs appointment for further refills.  Marland Kitchen escitalopram (LEXAPRO) 20 MG tablet Take 20 mg by mouth at bedtime.   . furosemide (LASIX) 40 MG tablet Take 1 tablet (40 mg total) by mouth 2 (two) times daily. Daily at 0800 am  . ipratropium-albuterol (DUONEB) 0.5-2.5 (3) MG/3ML SOLN Take 3 mLs by nebulization 3 (three) times daily.  Marland Kitchen LORazepam (ATIVAN) 0.5 MG tablet Take 1 tablet (0.5 mg total) by mouth at bedtime. Take one tablet by mouth once daily  . Melatonin 10 MG TABS Take 1 tablet by mouth at bedtime.  . Multiple Vitamins-Minerals (CENTRUM SILVER 50+WOMEN PO) Take 1 tablet by mouth daily.  . nitroGLYCERIN (NITROSTAT) 0.4 MG SL tablet Place 1 tablet (0.4 mg total) under the tongue every 5 (five) minutes as needed for chest pain.  Marland Kitchen omeprazole (PRILOSEC) 20 MG capsule Take 1 capsule (20 mg total) by mouth 2 (two) times daily before a meal.  . potassium chloride (K-DUR,KLOR-CON) 10 MEQ tablet Take 10 mEq by mouth 2 (two) times daily.  . vitamin B-12 (CYANOCOBALAMIN) 1000 MCG tablet Take 1,000 mcg by mouth daily.  . [DISCONTINUED] triamcinolone cream (KENALOG) 0.1 % Apply 1 application topically 2 (two) times daily. Apply to fungal rash of bilateral buttocks and sacrum for 10 days from 06/11/2018-06/20/2018   No facility-administered encounter medications on file as of 06/29/2018.      Review of Systems   In general she is not complaining of any fever or chills she gained a small amount of weight but this appears to be more appetite related.  Skin is not complain of rashes or itching.  Head ears eyes nose mouth and throat is not complaining of any sore throat or visual  changes.  Respiratory does not complain of shortness of breath or cough at this point.  She is oxygen dependent with history of COPD.  Cardiac is not complaining of chest pain has some very mild lower extremity edema.  GI does not complain of abdominal pain nausea vomiting diarrhea constipation says her appetite has improved.  GU is not complaining of dysuria.  Musculoskeletal is not complaining of joint pain continues to have some weakness however.  Neurologic is not complaining of dizziness headache or syncope.  Psych continues to be in good spirits looking forward to going home-  Vitals:   06/29/18 1418  BP: 128/74  Pulse: 89  Resp: 20  Temp: 98.6 F (37 C)  TempSrc: Oral  SpO2: 94%    Physical Exam   In general this continues to be a pleasant elderly female in no distress lying comfortably in bed.  Her skin is warm and dry.  Eyes visual acuity appears to be intact sclera and conjunctive are clear.  Chest is clear to auscultation there is no labored breathing.  Heart is regular irregular rate and rhythm without murmur gallop or rub her edema is quite mild and appears baseline with previous exams.  Abdomen is soft nontender with positive bowel sounds.  Musculoskeletal moves all extremities x4 with some lower extremity weakness would benefit from continued therapy.  Neurologic appears grossly intact her speech is clear no lateralizing findings.  Psych she is alert and oriented very pleasant and appropriate  Labs reviewed: Basic Metabolic Panel: Recent Labs    06/06/18 0623  06/11/18 0615 06/18/18 0715 06/24/18 0721  NA 136   < > 136 139 138  K 4.0   < > 3.9 4.1 4.0  CL 94*   < > 93* 98 98  CO2 34*   < > 34* 34* 32  GLUCOSE 96   < > 150* 113* 106*  BUN 30*   < > 29* 24* 26*  CREATININE 0.84   < > 1.03* 1.00 1.08*  CALCIUM 8.5*   < > 8.6* 8.9 8.8*  MG 2.1  --   --   --   --    < > = values in this interval not displayed.   Liver Function  Tests: Recent Labs    05/26/18 0412 06/07/18 0921 06/18/18 0715  AST 14* 22 13*  ALT 17 48* 13  ALKPHOS 32* 46 41  BILITOT 0.8 0.8 0.7  PROT 5.2* 5.7* 5.8*  ALBUMIN 2.9* 3.2* 3.2*   No results for input(s): LIPASE, AMYLASE in the last 8760 hours. Recent Labs    05/23/18 0600  AMMONIA 22   CBC: Recent Labs    06/11/18 0615 06/18/18 0715 06/24/18 0721  WBC 9.5 5.2 4.2  NEUTROABS 7.3 3.1 2.5  HGB 9.9* 8.7* 8.5*  HCT 33.2* 29.5* 28.7*  MCV 88.5 88.9 87.0  PLT 205 168 224   Cardiac Enzymes: Recent Labs    05/01/18 0548 05/21/18 1129 05/23/18 0600  TROPONINI <0.03 <0.03 <0.03   BNP: Invalid input(s): POCBNP CBG: Recent Labs    05/21/18 1614 05/27/18 1101  GLUCAP 129* 133*    Procedures and Imaging Studies During Stay: Dg Chest 1 View  Result Date: 06/04/2018 CLINICAL DATA:  Pleural effusion EXAM: CHEST  1 VIEW COMPARISON:  05/30/2018, 05/29/2018, CT 05/24/2018 FINDINGS: Left-sided pacing device as before. Right upper extremity catheter tip overlies the distal SVC. There are small bilateral pleural effusions. There is a left lower chest drainage catheter tip appears slightly more lateral as compared with 05/29/2018. There is decreased left pleural effusion. Stable enlarged cardiomediastinal silhouette with  aortic atherosclerosis. No pneumothorax is seen. IMPRESSION: 1. Left lung base drainage catheter remains in place. There appears to be decreased left effusion. 2. There are small pleural effusions bilaterally. 3. There is stable cardiomegaly. Electronically Signed   By: Jasmine Pang M.D.   On: 06/04/2018 16:34   Dg Chest 2 View  Result Date: 06/17/2018 CLINICAL DATA:  Pleural effusion and pneumothorax EXAM: CHEST - 2 VIEW COMPARISON:  Two days ago FINDINGS: Stable positioning of left chest tube. Small left apical pneumothorax, less than 10%, 3.5 rib interspaces in height - previously 2.5. small left effusion. Cardiomegaly with dual-chamber pacer leads. IMPRESSION:  1. Small left apical pneumothorax that measures slightly larger than 2 days ago. 2. Trace effusion. Electronically Signed   By: Marnee Spring M.D.   On: 06/17/2018 11:35   Dg Chest 2 View  Result Date: 06/15/2018 CLINICAL DATA:  Replacement of left chest tube. EXAM: CHEST - 2 VIEW COMPARISON:  06/14/2018 FINDINGS: Dual lead pacemaker as seen previously. Left pigtail thoracostomy tube in place in the left lower chest. Small left apical pneumothorax, the same size as yesterday, 5% or less. Mild left base atelectasis as previously seen. IMPRESSION: No radiographic change since yesterday. Small left apical pneumothorax, 5% or less. Mild left base atelectasis. Electronically Signed   By: Paulina Fusi M.D.   On: 06/15/2018 15:34   Dg Chest 2 View  Result Date: 06/14/2018 CLINICAL DATA:  Shortness of breath. EXAM: CHEST - 2 VIEW COMPARISON:  Radiograph of June 07, 2018. FINDINGS: Stable cardiomegaly. Atherosclerosis of thoracic aorta is noted. Left-sided pacemaker is unchanged in position. Left-sided chest tube has been partially withdrawn, although distal tip appears to be within thoracic space. Mild left apical pneumothorax is now noted. Right lung is clear. Minimal bilateral pleural effusions are noted. Bony thorax is unremarkable. IMPRESSION: Mild left apical pneumothorax is noted. Left-sided chest tube appears to have been partially withdrawn, although distal tip remains within the thoracic space. Minimal bilateral pleural effusions. Aortic Atherosclerosis (ICD10-I70.0). Electronically Signed   By: Lupita Raider, M.D.   On: 06/14/2018 14:04   Dg Fluoro Rm 1-60 Min  Result Date: 06/17/2018 CLINICAL DATA:  Empyema - left chest tube placed 05/27/2018 EXAM: FLOURO RM 1-60 MIN Left chest tube removal CONTRAST:  None FLUOROSCOPY TIME:  Fluoroscopy Time:  None Radiation Exposure Index (if provided by the fluoroscopic device): No Number of Acquired Spot Images: 0 COMPARISON:  None. FINDINGS: Left chest tube  removal Without complication Vaseline gauze dressing placed IMPRESSION: Left chest tube removal without complication Read by Robet Leu [facility] Electronically Signed   By: Ulyses Southward M.D.   On: 06/17/2018 12:05   Dg Chest Port 1 View  Result Date: 06/07/2018 CLINICAL DATA:  Pulmonary edema. EXAM: PORTABLE CHEST 1 VIEW COMPARISON:  Eighteen 2019 and prior radiographs FINDINGS: Cardiomegaly, RIGHT PICC line with tip overlying the LOWER SVC, LEFT pacemaker and LEFT thoracostomy tube again noted. Decreased pulmonary edema identified with improved bilateral LOWER lung aeration. There is no evidence of pneumothorax. No acute bony abnormalities are present. IMPRESSION: Decreased pulmonary edema with improved bilateral LOWER lung aeration. Electronically Signed   By: Harmon Pier M.D.   On: 06/07/2018 08:26   Dg Chest Port 1 View  Result Date: 06/06/2018 CLINICAL DATA:  Pleural effusion EXAM: PORTABLE CHEST 1 VIEW COMPARISON:  06/04/2018 FINDINGS: Worsening bilateral airspace disease. Progressive bilateral effusions with bibasilar atelectasis. Dual lead pacemaker unchanged. Left pleural pigtail catheter remains in place in the base. No pneumothorax.  Right sided PICC tip in the SVC unchanged. IMPRESSION: Worsening diffuse bilateral airspace disease and bilateral effusions consistent with heart failure and edema. Pigtail chest tube on the left  without pneumothorax. Electronically Signed   By: Marlan Palau M.D.   On: 06/06/2018 08:39    Assessment/Plan:     #1 history of chronic respiratory failure with hypoxia thought to be multifactorial- she appears to be have made a nice recovery she is completed antibiotics for suspected pneumonia- this was complicated with left empyema- her chest tube has been removed- she appears to have tolerated this well.  2.  History of combined CHF with ejection fraction of 45%-she continues on Lasix 40 mg twice daily with potassium supplementation her weights have been  relatively stable as well as edema-this will warrant follow-up by primary care provider   3.  History of COPD she continues on chronic oxygen as well as BiPAP and has duo nebs 3 times a day.  4.  History of metabolic encephalopathy this appears to be resolved and thought due to acute process with the respiratory failure.  5.-  History of atrial fibrillation this appears rate controlled on her beta-blocker she is on Eliquis for anticoagulation.  6.  History of sick sinus syndrome does have a pacemaker which will warrant follow-up by cardiology and primary care provider.  #7- history of anemia I suspect there is some chronicity to this herehemoglobin's have run from  the lower eights to upper 9 area usually between 8 and 9 last hemoglobin was 8.5 last week previous one was 8.7 this will warrant follow-up by primary care provider--it appears before hospitalization hemoglobin was more in the 11 range- will update this before discharge   \#8- history of B12 deficiency she is on supplementation again this will warrant follow-up by primary care provider.  9.  Depression this appears stable on Lexapro.  10.  History of hypertension again she is on Zebeta-blood pressures appear to be stable with systolics largely in the 11/09/1928 range diastolics in the 50s to 90s there is more variability with that.  Orthostatics appear to be fairly stable today lying was 128/76-sitting 126/74-and standing 121/74  #11 history of type 2 diabetes which is diet controlled again blood sugars appear to be stable largely in the lower 100s  #12 history of chronic kidney disease this appears stable during her stay here last creatinine was 1.08 with a BUN of 26 on lab done September 5 this will warrant follow-up by primary care provider  #13 History of anxiety-she continues on Ativan nightly and apparently has tolerated this well.     She will be going home and has all the DME needed at her house she will need  continued PT and OT for strengthening as well as nursing support follow-up for multiple medical issues.  She will have help from her daughter at home-continues on chronic oxygen and BiPAP at home.  Again she will need expedient follow-up by primary care provider for her multiple medical issues.  CPT- 99316-of note greater than 30 minutes spent on this discharge summary- greater than 50% of time spent coordinating plan of care for numerous diagnoses  Addendum- June 30, 2018.  I have reviewed the updated labs these appear relatively stable creatinine is up slightly from last week at 1.2 was 1.08 BUN is stable at 23-electrolytes are stable with sodium of 137 potassium 3.9  Hemoglobin shows stability from previous week at 8.7- white count is normal at 5.4.  Again she will  need expedient primary care follow-up for her multiple medical issues and follow-up of her hemoglobin.

## 2018-07-01 ENCOUNTER — Other Ambulatory Visit: Payer: Medicare Other

## 2018-07-01 DIAGNOSIS — I2510 Atherosclerotic heart disease of native coronary artery without angina pectoris: Secondary | ICD-10-CM | POA: Diagnosis not present

## 2018-07-01 DIAGNOSIS — J440 Chronic obstructive pulmonary disease with acute lower respiratory infection: Secondary | ICD-10-CM | POA: Diagnosis not present

## 2018-07-01 DIAGNOSIS — Z9981 Dependence on supplemental oxygen: Secondary | ICD-10-CM | POA: Diagnosis not present

## 2018-07-01 DIAGNOSIS — I5043 Acute on chronic combined systolic (congestive) and diastolic (congestive) heart failure: Secondary | ICD-10-CM | POA: Diagnosis not present

## 2018-07-01 DIAGNOSIS — F419 Anxiety disorder, unspecified: Secondary | ICD-10-CM | POA: Diagnosis not present

## 2018-07-01 DIAGNOSIS — E1122 Type 2 diabetes mellitus with diabetic chronic kidney disease: Secondary | ICD-10-CM | POA: Diagnosis not present

## 2018-07-01 DIAGNOSIS — Z955 Presence of coronary angioplasty implant and graft: Secondary | ICD-10-CM | POA: Diagnosis not present

## 2018-07-01 DIAGNOSIS — D649 Anemia, unspecified: Secondary | ICD-10-CM | POA: Diagnosis not present

## 2018-07-01 DIAGNOSIS — A420 Pulmonary actinomycosis: Secondary | ICD-10-CM | POA: Diagnosis not present

## 2018-07-01 DIAGNOSIS — N183 Chronic kidney disease, stage 3 (moderate): Secondary | ICD-10-CM | POA: Diagnosis not present

## 2018-07-01 DIAGNOSIS — F329 Major depressive disorder, single episode, unspecified: Secondary | ICD-10-CM | POA: Diagnosis not present

## 2018-07-01 DIAGNOSIS — I480 Paroxysmal atrial fibrillation: Secondary | ICD-10-CM | POA: Diagnosis not present

## 2018-07-01 DIAGNOSIS — Z87891 Personal history of nicotine dependence: Secondary | ICD-10-CM | POA: Diagnosis not present

## 2018-07-01 DIAGNOSIS — J9621 Acute and chronic respiratory failure with hypoxia: Secondary | ICD-10-CM | POA: Diagnosis not present

## 2018-07-01 DIAGNOSIS — Z79899 Other long term (current) drug therapy: Secondary | ICD-10-CM | POA: Diagnosis not present

## 2018-07-01 DIAGNOSIS — I495 Sick sinus syndrome: Secondary | ICD-10-CM | POA: Diagnosis not present

## 2018-07-01 DIAGNOSIS — Z7901 Long term (current) use of anticoagulants: Secondary | ICD-10-CM | POA: Diagnosis not present

## 2018-07-01 DIAGNOSIS — Z950 Presence of cardiac pacemaker: Secondary | ICD-10-CM | POA: Diagnosis not present

## 2018-07-01 DIAGNOSIS — I130 Hypertensive heart and chronic kidney disease with heart failure and stage 1 through stage 4 chronic kidney disease, or unspecified chronic kidney disease: Secondary | ICD-10-CM | POA: Diagnosis not present

## 2018-07-01 NOTE — Patient Outreach (Signed)
[facility]  07/01/2018  Kathy CuriaVirginia L Page 01/03/1931 [id number]    Successful outreach with [name]. She reported feeling well since discharge. No complaints of pain, shortness of breath or chest discomfort. Tolerating daily activities with O2 at 2L/min. Denied falls. Reported compliance with medication and daily glucose monitoring. Morning FSBG of 101. She confirmed engagement with [facility] services.  Discussed [facility] services. [name] lives alone but reported having a strong support system with her daughter Kathy AyeKristy serving as her primary caregiver. Denied concerns regarding medication affordability and stated that weekly medications are prepared by Kathy KongKristy.. [facility] .   PLAN Will follow up next week for home visit.    Kathy CabalFelecia Kingdom Vanzanten,RN [facility] Community Care Manager [phone]

## 2018-07-06 ENCOUNTER — Other Ambulatory Visit: Payer: Self-pay

## 2018-07-06 NOTE — Patient Outreach (Signed)
[name]) Care Management   07/06/2018  Kathy CuriaVirginia L Page 03/15/1931 [id number]  Kathy CuriaVirginia L Page is an 84 y.o. female  Subjective:  Initial home visit. Member alert and oriented x 3. Reported feeling "fair." No complaints of shortness of breath or chest discomfort at RNCM's arrival.   Objective:   BP 112/68 (BP Location: Left Arm, Patient Position: Sitting, Cuff Size: Normal)   Pulse 87   Ht 1.626 m (5\' 4" )   Wt 145 lb (65.8 kg)   SpO2 99%   BMI 24.89 kg/m   Review of Systems  Constitutional: Positive for malaise/fatigue.  HENT: Negative.   Eyes: Negative.   Respiratory: Positive for cough.   Cardiovascular: Positive for leg swelling.  Gastrointestinal: Negative.   Genitourinary: Positive for urgency.       Patient reported occasional episodes of urgency.  Musculoskeletal: Negative.   Skin:       Reported healing pressure wound to buttocks.  Neurological: Negative.     Physical Exam  Constitutional: She is oriented to person, place, and time. She appears well-developed.  Cardiovascular: Normal rate.  Respiratory: Effort normal.  GI: Soft. Bowel sounds are normal.  Musculoskeletal: She exhibits edema.  Edema to both lower extremities.  Neurological: She is alert and oriented to person, place, and time.  Skin: Skin is warm and dry.  Psychiatric: She has a normal mood and affect. Her behavior is normal. Judgment and thought content normal.    Encounter Medications:   Outpatient Encounter Medications as of 07/06/2018  Medication Sig  . apixaban (ELIQUIS) 5 MG TABS tablet Take 1 tablet (5 mg total) by mouth 2 (two) times daily.  . bisoprolol (ZEBETA) 5 MG tablet Take 0.5 tablets (2.5 mg total) by mouth daily. Pt needs appointment for further refills.  Marland Kitchen. escitalopram (LEXAPRO) 20 MG tablet Take 20 mg by mouth at bedtime.   . furosemide (LASIX) 40 MG tablet Take 1 tablet (40 mg total) by mouth 2 (two) times daily. Daily at 0800 am  .  ipratropium-albuterol (DUONEB) 0.5-2.5 (3) MG/3ML SOLN Take 3 mLs by nebulization 3 (three) times daily.  Marland Kitchen. LORazepam (ATIVAN) 0.5 MG tablet Take 1 tablet (0.5 mg total) by mouth at bedtime. Take one tablet by mouth once daily  . Melatonin 10 MG TABS Take 1 tablet by mouth at bedtime.  . Multiple Vitamins-Minerals (CENTRUM SILVER 50+WOMEN PO) Take 1 tablet by mouth daily.  . nitroGLYCERIN (NITROSTAT) 0.4 MG SL tablet Place 1 tablet (0.4 mg total) under the tongue every 5 (five) minutes as needed for chest pain.  Marland Kitchen. omeprazole (PRILOSEC) 20 MG capsule Take 1 capsule (20 mg total) by mouth 2 (two) times daily before a meal.  . potassium chloride (K-DUR,KLOR-CON) 10 MEQ tablet Take 10 mEq by mouth 2 (two) times daily.  . vitamin B-12 (CYANOCOBALAMIN) 1000 MCG tablet Take 1,000 mcg by mouth daily.   No facility-administered encounter medications on file as of 07/06/2018.     Functional Status:   In your present state of health, do you have any difficulty performing the following activities: 05/21/2018  Hearing? N  Vision? N  Difficulty concentrating or making decisions? N  Walking or climbing stairs? Y  Dressing or bathing? N  Doing errands, shopping? N  Some recent data might be hidden    Fall/Depression Screening:    Fall Risk  07/06/2018  Falls in the past year? No  Risk for fall due to : Impaired balance/gait;Other (Comment)   PHQ 2/9 Scores 07/06/2018  PHQ - 2 Score 0    Assessment:    Home visit complete. [name] reported feeling better since discharge but still fatigued. Denied complaints of shortness of breath or chest discomfort.  Reported no falls but decreased activity tolerance. Reported weight of 145lbs and fasting blood glucose of 101. Compliant with prescribed medications and using O2 at 2L/min as prescribed. Stated that her daughter prepares her medications in two week increments. She is not interested in compliance packages and denied concerns regarding medication  affordability but requested that current nebulizer be replaced. Reviewed care management goals and available [facility] services. She is receiving services from [facility]. Reported good support from family and friends and declined need for transportation or community outreach services.   [facility] CM Care Plan Problem One     Most Recent Value  Care Plan Problem One  Risk for readmission  Role Documenting the Problem One  Care Management Coordinator  Care Plan for Problem One  Active  [facility] Long Term Goal   Patient will not be hospitalized over the next 90 days.  [facility] Long Term Goal Start Date  07/06/18  Interventions for Problem One Long Term Goal  Educated patient regarding medications, daily weights and s/sx of disease complications. Discussed action plan as outlined by Home Health COPD nurse.  [facility] CM Short Term Goal #1   Over the next 30 days patient will weigh daily and maintain log.  [facility] CM Short Term Goal #1 Start Date  07/06/18  Interventions for Short Term Goal #1  Discussed importance of weighing daily. Discussed weight parameters and indications for notifying MD.  [facility] CM Short Term Goal #2   Over the next 30 days patient will take all medications as prescribed.  [facility] CM Short Term Goal #2 Start Date  07/06/18  Interventions for Short Term Goal #2  Reviewed medications and indications for use. Discussed medication adherence and affordability.   [facility] CM Short Term Goal #3  Over the next 30 days patient will attend all PCP and MD specialty appointments as scheduled.  [facility] CM Short Term Goal #3 Start Date  07/06/18  Interventions for Short Tern Goal #3  Encouraged to attend appointments as scheduled. Reviewed pending appointment and discussed transportation needs.       PLAN Will follow up on next week.   [name] [facility] Community Care Manager [phone]

## 2018-07-07 DIAGNOSIS — M6281 Muscle weakness (generalized): Secondary | ICD-10-CM | POA: Diagnosis not present

## 2018-07-07 DIAGNOSIS — I5043 Acute on chronic combined systolic (congestive) and diastolic (congestive) heart failure: Secondary | ICD-10-CM | POA: Diagnosis not present

## 2018-07-07 DIAGNOSIS — J9601 Acute respiratory failure with hypoxia: Secondary | ICD-10-CM | POA: Diagnosis not present

## 2018-07-07 DIAGNOSIS — A420 Pulmonary actinomycosis: Secondary | ICD-10-CM | POA: Diagnosis not present

## 2018-07-07 DIAGNOSIS — F411 Generalized anxiety disorder: Secondary | ICD-10-CM | POA: Diagnosis not present

## 2018-07-07 DIAGNOSIS — Z0001 Encounter for general adult medical examination with abnormal findings: Secondary | ICD-10-CM | POA: Diagnosis not present

## 2018-07-07 DIAGNOSIS — E1122 Type 2 diabetes mellitus with diabetic chronic kidney disease: Secondary | ICD-10-CM | POA: Diagnosis not present

## 2018-07-07 DIAGNOSIS — J440 Chronic obstructive pulmonary disease with acute lower respiratory infection: Secondary | ICD-10-CM | POA: Diagnosis not present

## 2018-07-07 DIAGNOSIS — I480 Paroxysmal atrial fibrillation: Secondary | ICD-10-CM | POA: Diagnosis not present

## 2018-07-07 DIAGNOSIS — I5032 Chronic diastolic (congestive) heart failure: Secondary | ICD-10-CM | POA: Diagnosis not present

## 2018-07-07 DIAGNOSIS — J189 Pneumonia, unspecified organism: Secondary | ICD-10-CM | POA: Diagnosis not present

## 2018-07-07 DIAGNOSIS — I130 Hypertensive heart and chronic kidney disease with heart failure and stage 1 through stage 4 chronic kidney disease, or unspecified chronic kidney disease: Secondary | ICD-10-CM | POA: Diagnosis not present

## 2018-07-07 DIAGNOSIS — I2510 Atherosclerotic heart disease of native coronary artery without angina pectoris: Secondary | ICD-10-CM | POA: Diagnosis not present

## 2018-07-07 DIAGNOSIS — Z6825 Body mass index (BMI) 25.0-25.9, adult: Secondary | ICD-10-CM | POA: Diagnosis not present

## 2018-07-08 DIAGNOSIS — I2510 Atherosclerotic heart disease of native coronary artery without angina pectoris: Secondary | ICD-10-CM | POA: Diagnosis not present

## 2018-07-08 DIAGNOSIS — E1122 Type 2 diabetes mellitus with diabetic chronic kidney disease: Secondary | ICD-10-CM | POA: Diagnosis not present

## 2018-07-08 DIAGNOSIS — I5043 Acute on chronic combined systolic (congestive) and diastolic (congestive) heart failure: Secondary | ICD-10-CM | POA: Diagnosis not present

## 2018-07-08 DIAGNOSIS — A420 Pulmonary actinomycosis: Secondary | ICD-10-CM | POA: Diagnosis not present

## 2018-07-08 DIAGNOSIS — I130 Hypertensive heart and chronic kidney disease with heart failure and stage 1 through stage 4 chronic kidney disease, or unspecified chronic kidney disease: Secondary | ICD-10-CM | POA: Diagnosis not present

## 2018-07-08 DIAGNOSIS — J440 Chronic obstructive pulmonary disease with acute lower respiratory infection: Secondary | ICD-10-CM | POA: Diagnosis not present

## 2018-07-09 DIAGNOSIS — I5043 Acute on chronic combined systolic (congestive) and diastolic (congestive) heart failure: Secondary | ICD-10-CM | POA: Diagnosis not present

## 2018-07-09 DIAGNOSIS — E1122 Type 2 diabetes mellitus with diabetic chronic kidney disease: Secondary | ICD-10-CM | POA: Diagnosis not present

## 2018-07-09 DIAGNOSIS — I2510 Atherosclerotic heart disease of native coronary artery without angina pectoris: Secondary | ICD-10-CM | POA: Diagnosis not present

## 2018-07-09 DIAGNOSIS — I130 Hypertensive heart and chronic kidney disease with heart failure and stage 1 through stage 4 chronic kidney disease, or unspecified chronic kidney disease: Secondary | ICD-10-CM | POA: Diagnosis not present

## 2018-07-09 DIAGNOSIS — J440 Chronic obstructive pulmonary disease with acute lower respiratory infection: Secondary | ICD-10-CM | POA: Diagnosis not present

## 2018-07-09 DIAGNOSIS — A420 Pulmonary actinomycosis: Secondary | ICD-10-CM | POA: Diagnosis not present

## 2018-07-13 DIAGNOSIS — E1122 Type 2 diabetes mellitus with diabetic chronic kidney disease: Secondary | ICD-10-CM | POA: Diagnosis not present

## 2018-07-13 DIAGNOSIS — I2510 Atherosclerotic heart disease of native coronary artery without angina pectoris: Secondary | ICD-10-CM | POA: Diagnosis not present

## 2018-07-13 DIAGNOSIS — I5043 Acute on chronic combined systolic (congestive) and diastolic (congestive) heart failure: Secondary | ICD-10-CM | POA: Diagnosis not present

## 2018-07-13 DIAGNOSIS — J440 Chronic obstructive pulmonary disease with acute lower respiratory infection: Secondary | ICD-10-CM | POA: Diagnosis not present

## 2018-07-13 DIAGNOSIS — I130 Hypertensive heart and chronic kidney disease with heart failure and stage 1 through stage 4 chronic kidney disease, or unspecified chronic kidney disease: Secondary | ICD-10-CM | POA: Diagnosis not present

## 2018-07-13 DIAGNOSIS — A420 Pulmonary actinomycosis: Secondary | ICD-10-CM | POA: Diagnosis not present

## 2018-07-15 DIAGNOSIS — I130 Hypertensive heart and chronic kidney disease with heart failure and stage 1 through stage 4 chronic kidney disease, or unspecified chronic kidney disease: Secondary | ICD-10-CM | POA: Diagnosis not present

## 2018-07-15 DIAGNOSIS — A420 Pulmonary actinomycosis: Secondary | ICD-10-CM | POA: Diagnosis not present

## 2018-07-15 DIAGNOSIS — I5043 Acute on chronic combined systolic (congestive) and diastolic (congestive) heart failure: Secondary | ICD-10-CM | POA: Diagnosis not present

## 2018-07-15 DIAGNOSIS — E1122 Type 2 diabetes mellitus with diabetic chronic kidney disease: Secondary | ICD-10-CM | POA: Diagnosis not present

## 2018-07-15 DIAGNOSIS — I2510 Atherosclerotic heart disease of native coronary artery without angina pectoris: Secondary | ICD-10-CM | POA: Diagnosis not present

## 2018-07-15 DIAGNOSIS — J440 Chronic obstructive pulmonary disease with acute lower respiratory infection: Secondary | ICD-10-CM | POA: Diagnosis not present

## 2018-07-16 ENCOUNTER — Other Ambulatory Visit: Payer: Medicare Other

## 2018-07-16 DIAGNOSIS — I5043 Acute on chronic combined systolic (congestive) and diastolic (congestive) heart failure: Secondary | ICD-10-CM | POA: Diagnosis not present

## 2018-07-16 DIAGNOSIS — I130 Hypertensive heart and chronic kidney disease with heart failure and stage 1 through stage 4 chronic kidney disease, or unspecified chronic kidney disease: Secondary | ICD-10-CM | POA: Diagnosis not present

## 2018-07-16 DIAGNOSIS — A420 Pulmonary actinomycosis: Secondary | ICD-10-CM | POA: Diagnosis not present

## 2018-07-16 DIAGNOSIS — J440 Chronic obstructive pulmonary disease with acute lower respiratory infection: Secondary | ICD-10-CM | POA: Diagnosis not present

## 2018-07-16 DIAGNOSIS — I2510 Atherosclerotic heart disease of native coronary artery without angina pectoris: Secondary | ICD-10-CM | POA: Diagnosis not present

## 2018-07-16 DIAGNOSIS — E1122 Type 2 diabetes mellitus with diabetic chronic kidney disease: Secondary | ICD-10-CM | POA: Diagnosis not present

## 2018-07-19 DIAGNOSIS — I5043 Acute on chronic combined systolic (congestive) and diastolic (congestive) heart failure: Secondary | ICD-10-CM | POA: Diagnosis not present

## 2018-07-19 DIAGNOSIS — J440 Chronic obstructive pulmonary disease with acute lower respiratory infection: Secondary | ICD-10-CM | POA: Diagnosis not present

## 2018-07-19 DIAGNOSIS — A420 Pulmonary actinomycosis: Secondary | ICD-10-CM | POA: Diagnosis not present

## 2018-07-19 DIAGNOSIS — I2510 Atherosclerotic heart disease of native coronary artery without angina pectoris: Secondary | ICD-10-CM | POA: Diagnosis not present

## 2018-07-19 DIAGNOSIS — I130 Hypertensive heart and chronic kidney disease with heart failure and stage 1 through stage 4 chronic kidney disease, or unspecified chronic kidney disease: Secondary | ICD-10-CM | POA: Diagnosis not present

## 2018-07-19 DIAGNOSIS — E1122 Type 2 diabetes mellitus with diabetic chronic kidney disease: Secondary | ICD-10-CM | POA: Diagnosis not present

## 2018-07-20 DIAGNOSIS — A420 Pulmonary actinomycosis: Secondary | ICD-10-CM | POA: Diagnosis not present

## 2018-07-20 DIAGNOSIS — E1122 Type 2 diabetes mellitus with diabetic chronic kidney disease: Secondary | ICD-10-CM | POA: Diagnosis not present

## 2018-07-20 DIAGNOSIS — I2510 Atherosclerotic heart disease of native coronary artery without angina pectoris: Secondary | ICD-10-CM | POA: Diagnosis not present

## 2018-07-20 DIAGNOSIS — I5043 Acute on chronic combined systolic (congestive) and diastolic (congestive) heart failure: Secondary | ICD-10-CM | POA: Diagnosis not present

## 2018-07-20 DIAGNOSIS — J440 Chronic obstructive pulmonary disease with acute lower respiratory infection: Secondary | ICD-10-CM | POA: Diagnosis not present

## 2018-07-20 DIAGNOSIS — I130 Hypertensive heart and chronic kidney disease with heart failure and stage 1 through stage 4 chronic kidney disease, or unspecified chronic kidney disease: Secondary | ICD-10-CM | POA: Diagnosis not present

## 2018-07-21 ENCOUNTER — Encounter: Payer: Self-pay | Admitting: Internal Medicine

## 2018-07-21 ENCOUNTER — Ambulatory Visit (INDEPENDENT_AMBULATORY_CARE_PROVIDER_SITE_OTHER): Payer: Medicare Other | Admitting: Internal Medicine

## 2018-07-21 ENCOUNTER — Ambulatory Visit: Payer: Medicare Other

## 2018-07-21 VITALS — BP 112/68 | HR 83 | Ht 64.0 in | Wt 153.0 lb

## 2018-07-21 DIAGNOSIS — E1122 Type 2 diabetes mellitus with diabetic chronic kidney disease: Secondary | ICD-10-CM | POA: Diagnosis not present

## 2018-07-21 DIAGNOSIS — I2510 Atherosclerotic heart disease of native coronary artery without angina pectoris: Secondary | ICD-10-CM | POA: Diagnosis not present

## 2018-07-21 DIAGNOSIS — A420 Pulmonary actinomycosis: Secondary | ICD-10-CM | POA: Diagnosis not present

## 2018-07-21 DIAGNOSIS — I495 Sick sinus syndrome: Secondary | ICD-10-CM

## 2018-07-21 DIAGNOSIS — J440 Chronic obstructive pulmonary disease with acute lower respiratory infection: Secondary | ICD-10-CM | POA: Diagnosis not present

## 2018-07-21 DIAGNOSIS — I5043 Acute on chronic combined systolic (congestive) and diastolic (congestive) heart failure: Secondary | ICD-10-CM | POA: Diagnosis not present

## 2018-07-21 DIAGNOSIS — I130 Hypertensive heart and chronic kidney disease with heart failure and stage 1 through stage 4 chronic kidney disease, or unspecified chronic kidney disease: Secondary | ICD-10-CM | POA: Diagnosis not present

## 2018-07-21 DIAGNOSIS — I480 Paroxysmal atrial fibrillation: Secondary | ICD-10-CM

## 2018-07-21 NOTE — Progress Notes (Signed)
HPI [name] returns today for followup. She is a very pleasant 84 year old woman with a history of paroxysmal atrial fibrillation, symptomatic sinus node dysfunction, status post pacemaker insertion. She also wears chronic home oxygen therapy secondary to COPD. She was a prior smoker. She denies chest pain or shortness of breath. No peripheral edema. No palpitations. No syncope. She has been in the hospital with pneumonia requiring a chest tube.  Allergies  Allergen Reactions  . Codeine Nausea Only  . Heparin Other (See Comments)    Causes internal bleeding      Current Outpatient Medications  Medication Sig Dispense Refill  . apixaban (ELIQUIS) 5 MG TABS tablet Take 1 tablet (5 mg total) by mouth 2 (two) times daily. 60 tablet 0  . bisoprolol (ZEBETA) 5 MG tablet Take 0.5 tablets (2.5 mg total) by mouth daily. Pt needs appointment for further refills.    Marland Kitchen escitalopram (LEXAPRO) 20 MG tablet Take 20 mg by mouth at bedtime.     . furosemide (LASIX) 40 MG tablet Take 1 tablet (40 mg total) by mouth 2 (two) times daily. Daily at 0800 am 30 tablet   . ipratropium-albuterol (DUONEB) 0.5-2.5 (3) MG/3ML SOLN Take 3 mLs by nebulization 3 (three) times daily.    Marland Kitchen LORazepam (ATIVAN) 0.5 MG tablet Take 1 tablet (0.5 mg total) by mouth at bedtime. Take one tablet by mouth once daily 30 tablet 0  . Melatonin 10 MG TABS Take 1 tablet by mouth at bedtime.    . Multiple Vitamins-Minerals (CENTRUM SILVER 50+WOMEN PO) Take 1 tablet by mouth daily.    . nitroGLYCERIN (NITROSTAT) 0.4 MG SL tablet Place 1 tablet (0.4 mg total) under the tongue every 5 (five) minutes as needed for chest pain. 25 tablet 3  . omeprazole (PRILOSEC) 20 MG capsule Take 1 capsule (20 mg total) by mouth 2 (two) times daily before a meal. 60 capsule 11  . potassium chloride (K-DUR,KLOR-CON) 10 MEQ tablet Take 10 mEq by mouth 2 (two) times daily.    . vitamin B-12 (CYANOCOBALAMIN) 1000 MCG tablet Take 1,000 mcg by mouth  daily.     No current facility-administered medications for this visit.      Past Medical History:  Diagnosis Date  . Asthma   . COPD (chronic obstructive pulmonary disease) (HCC)    Oxygen dependent  . Coronary atherosclerosis of native coronary artery   . Essential hypertension, benign   . History of GI bleed    Ulcer  . Hyperlipidemia   . Old inferior wall myocardial infarction 1999   [facility]  . PAF (paroxysmal atrial fibrillation) (HCC)   . Sinus node dysfunction (HCC)    Medtronic PPM  . Type 2 diabetes mellitus (HCC)     ROS:   All systems reviewed and negative except as noted in the HPI.   Past Surgical History:  Procedure Laterality Date  . Cesareen section    . CHOLECYSTECTOMY    . COLONOSCOPY  09/2004   RMR: For obscure GI bleed. Left-sided diverticula, otherwise normal ileocolonoscopy  . Coronary artery stent  1999   BMS x 3 RCA  ([facility]), PTCRA RC4  . EP IMPLANTABLE DEVICE N/A 06/26/2016   Procedure: Pacemaker Implant;  Surgeon: Will Jorja Loa, MD;  Location: [facility] INVASIVE CV LAB;  Service: Cardiovascular;  Laterality: N/A;  . ESOPHAGOGASTRODUODENOSCOPY  11 2005   RMR: For melena, normal exam.  . EXPLORATORY LAPAROTOMY    . GIVENS CAPSULE STUDY  08/2004  official report unavailable, but reported couple of AVMs, nonbleeding  . IR PERC PLEURAL DRAIN W/INDWELL CATH W/IMG GUIDE  05/27/2018  . PARTIAL COLECTOMY  1977   blockage/gangrene? unclear if small bowel or colon  . THORACENTESIS Left 03/10/16   1 L transudative fluid     Family History  Problem Relation Age of Onset  . Diabetes Mother   . Diabetes Father   . Heart disease Father   . Cancer Sister        unknown  . Cancer Brother        unknown  . Diabetes Brother   . Colon cancer Neg Hx      Social History   Socioeconomic History  . Marital status: Widowed    Spouse name: Not on file  . Number of children: 2  . Years of education: Not on file  . Highest education level: Not on file    Occupational History  . Not on file  Social Needs  . Financial resource strain: Not on file  . Food insecurity:    Worry: Not on file    Inability: Not on file  . Transportation needs:    Medical: Not on file    Non-medical: Not on file  Tobacco Use  . Smoking status: Former Smoker    Packs/day: 2.00    Years: 40.00    Pack years: 80.00    Types: Cigarettes    Last attempt to quit: 01/13/1998    Years since quitting: 20.5  . Smokeless tobacco: Never Used  Substance and Sexual Activity  . Alcohol use: No    Alcohol/week: 0.0 standard drinks  . Drug use: No  . Sexual activity: Not on file  Lifestyle  . Physical activity:    Days per week: Not on file    Minutes per session: Not on file  . Stress: Not on file  Relationships  . Social connections:    Talks on phone: Not on file    Gets together: Not on file    Attends religious service: Not on file    Active member of club or organization: Not on file    Attends meetings of clubs or organizations: Not on file    Relationship status: Not on file  . Intimate partner violence:    Fear of current or ex partner: Not on file    Emotionally abused: Not on file    Physically abused: Not on file    Forced sexual activity: Not on file  Other Topics Concern  . Not on file  Social History Narrative  . Not on file     BP 112/68 (BP Location: Left Arm)   Pulse 83   Ht 5\' 4"  (1.626 m)   Wt 153 lb (69.4 kg)   SpO2 92%   BMI 26.26 kg/m   Physical Exam:  Chronically ill appearing NAD HEENT: Unremarkable Neck:  7 cm JVD, no thyromegally Lymphatics:  No adenopathy Back:  No CVA tenderness Lungs:  Clear with minimal rales. No wheezes HEART:  Regular rate rhythm, no murmurs, no rubs, no clicks Abd:  soft, positive bowel sounds, no organomegally, no rebound, no guarding Ext:  2 plus pulses, no edema, no cyanosis, no clubbing Skin:  No rashes no nodules Neuro:  CN II through XII intact, motor grossly intact  EKG -  none  DEVICE  Normal device function.  See PaceArt for details.   Assess/Plan: 1. Persistent atrial fib - she is asymptomatic. I have suggested she  continue her current rate control. She will continue eliquis. 2. Sinus node dysfunction - she is asymptomatic, s/p PPM insertion. 3. PPM - her Medtronic device is working normally.  4. Chronic diastolic heart failure - she will continue her current meds. She appears euvolemic on lasix.  Sherion Dooly,M.D.

## 2018-07-21 NOTE — Patient Instructions (Signed)
Medication Instructions:  Your physician recommends that you continue on your current medications as directed. Please refer to the Current Medication list given to you today.   Labwork: NONE   Testing/Procedures: NONE   Follow-Up: Your physician wants you to follow-up in: 1 Year with Dr. Ladona Ridgel. You will receive a reminder letter in the mail two months in advance. If you don't receive a letter, please call our office to schedule the follow-up appointment.   Any Other Special Instructions Will Be Listed Below (If Applicable).   You have been given samples of Eliquis today.   If you need a refill on your cardiac medications before your next appointment, please call your pharmacy. Thank you for choosing [facility]!

## 2018-07-22 ENCOUNTER — Other Ambulatory Visit: Payer: Self-pay

## 2018-07-22 NOTE — Patient Outreach (Signed)
[name]) Care Management   07/22/2018  [name] July 19, 1931 [id number]  [name] is an 84 y.o. female  Subjective:   Objective:   Review of Systems  Constitutional: Positive for malaise/fatigue.  HENT: Negative.   Eyes: Negative.   Respiratory: Negative.   Cardiovascular: Positive for leg swelling.  Gastrointestinal: Negative.   Genitourinary: Negative.   Musculoskeletal: Negative.   Skin: Negative.   Neurological: Negative.   Psychiatric/Behavioral: Negative.     Physical Exam  Constitutional: She is oriented to person, place, and time. She appears well-developed.  Respiratory: Effort normal.  GI: Soft. Bowel sounds are normal.  Neurological: She is alert and oriented to person, place, and time.  Skin: Skin is warm and dry.    Encounter Medications:   Outpatient Encounter Medications as of 07/22/2018  Medication Sig  . apixaban (ELIQUIS) 5 MG TABS tablet Take 1 tablet (5 mg total) by mouth 2 (two) times daily.  . bisoprolol (ZEBETA) 5 MG tablet Take 0.5 tablets (2.5 mg total) by mouth daily. Pt needs appointment for further refills.  Marland Kitchen escitalopram (LEXAPRO) 20 MG tablet Take 20 mg by mouth at bedtime.   . furosemide (LASIX) 40 MG tablet Take 1 tablet (40 mg total) by mouth 2 (two) times daily. Daily at 0800 am  . ipratropium-albuterol (DUONEB) 0.5-2.5 (3) MG/3ML SOLN Take 3 mLs by nebulization 3 (three) times daily.  Marland Kitchen LORazepam (ATIVAN) 0.5 MG tablet Take 1 tablet (0.5 mg total) by mouth at bedtime. Take one tablet by mouth once daily  . Melatonin 10 MG TABS Take 1 tablet by mouth at bedtime.  . Multiple Vitamins-Minerals (CENTRUM SILVER 50+WOMEN PO) Take 1 tablet by mouth daily.  . nitroGLYCERIN (NITROSTAT) 0.4 MG SL tablet Place 1 tablet (0.4 mg total) under the tongue every 5 (five) minutes as needed for chest pain.  Marland Kitchen omeprazole (PRILOSEC) 20 MG capsule Take 1 capsule (20 mg total) by mouth 2 (two) times daily before a meal.  .  potassium chloride (K-DUR,KLOR-CON) 10 MEQ tablet Take 10 mEq by mouth 2 (two) times daily.  . vitamin B-12 (CYANOCOBALAMIN) 1000 MCG tablet Take 1,000 mcg by mouth daily.   No facility-administered encounter medications on file as of 07/22/2018.     Functional Status:   In your present state of health, do you have any difficulty performing the following activities: 07/22/2018 05/21/2018  Hearing? Y N  Vision? Y N  Difficulty concentrating or making decisions? N N  Walking or climbing stairs? Y Y  Dressing or bathing? N N  Doing errands, shopping? Y N  Using the Toilet? N -  In the past six months, have you accidently leaked urine? N -  Do you have problems with loss of bowel control? N -  Managing your Medications? Y -  Comment Daughter prepares medications. -  Managing your Finances? Y -  Comment Assisted by daughter. -  Housekeeping or managing your Housekeeping? Y -  Comment Assisted by daughter. -  Some recent data might be hidden    Fall/Depression Screening:    Fall Risk  07/06/2018  Falls in the past year? No  Risk for fall due to : Impaired balance/gait;Other (Comment)   PHQ 2/9 Scores 07/22/2018 07/06/2018  PHQ - 2 Score 1 0    Assessment:   Routine home visit complete. [name] reported feeling well today. Continues to experience shortness of breath with mild exertion but reported her activity tolerance has increased since the last visit. Tolerating  short dinner outings without fatigue. Member remains compliant with medications but encouraged to perform consistent daily weights. Reported no worsening symptoms. Attended Cardiology appointment as scheduled and will follow up with Dr. Luan Pulling on 08/02/18.  [facility] CM Care Plan Problem One     Most Recent Value  Care Plan Problem One  Risk for readmission  Role Documenting the Problem One  Care Management [place] for Problem One  Active  [facility] Long Term Goal   Patient will not be hospitalized over the next 90  days.  [facility] Long Term Goal Start Date  07/06/18  Interventions for Problem One Long Term Goal  Reviewed medications, ICS use and CHF/COPD zones. Discussed s/sx that require immediate/urgent medical attention.   [facility] CM Short Term Goal #1   Over the next 30 days patient will weigh daily and maintain log.  [facility] CM Short Term Goal #1 Start Date  07/06/18  Interventions for Short Term Goal #1  Patient encouraged to weigh daily and maintain log.   [facility] CM Short Term Goal #2   Over the next 30 days patient will take all medications as prescribed.  [facility] CM Short Term Goal #2 Start Date  07/06/18  [facility] CM Short Term Goal #2 Met Date  07/22/18 [Daughter prepares prescribed medications.]  [facility] CM Short Term Goal #3  Over the next 30 days patient will attend all PCP and MD specialty appointments as scheduled.  [facility] CM Short Term Goal #3 Start Date  07/06/18  [facility] CM Short Term Goal #3 Met Date  07/22/18      PLAN Will continue routine outreach.  [place] [phone]

## 2018-07-23 DIAGNOSIS — A420 Pulmonary actinomycosis: Secondary | ICD-10-CM | POA: Diagnosis not present

## 2018-07-23 DIAGNOSIS — E1122 Type 2 diabetes mellitus with diabetic chronic kidney disease: Secondary | ICD-10-CM | POA: Diagnosis not present

## 2018-07-23 DIAGNOSIS — J440 Chronic obstructive pulmonary disease with acute lower respiratory infection: Secondary | ICD-10-CM | POA: Diagnosis not present

## 2018-07-23 DIAGNOSIS — I5043 Acute on chronic combined systolic (congestive) and diastolic (congestive) heart failure: Secondary | ICD-10-CM | POA: Diagnosis not present

## 2018-07-23 DIAGNOSIS — I130 Hypertensive heart and chronic kidney disease with heart failure and stage 1 through stage 4 chronic kidney disease, or unspecified chronic kidney disease: Secondary | ICD-10-CM | POA: Diagnosis not present

## 2018-07-23 DIAGNOSIS — I2510 Atherosclerotic heart disease of native coronary artery without angina pectoris: Secondary | ICD-10-CM | POA: Diagnosis not present

## 2018-07-27 DIAGNOSIS — I130 Hypertensive heart and chronic kidney disease with heart failure and stage 1 through stage 4 chronic kidney disease, or unspecified chronic kidney disease: Secondary | ICD-10-CM | POA: Diagnosis not present

## 2018-07-27 DIAGNOSIS — I5043 Acute on chronic combined systolic (congestive) and diastolic (congestive) heart failure: Secondary | ICD-10-CM | POA: Diagnosis not present

## 2018-07-27 DIAGNOSIS — A420 Pulmonary actinomycosis: Secondary | ICD-10-CM | POA: Diagnosis not present

## 2018-07-27 DIAGNOSIS — J440 Chronic obstructive pulmonary disease with acute lower respiratory infection: Secondary | ICD-10-CM | POA: Diagnosis not present

## 2018-07-27 DIAGNOSIS — E1122 Type 2 diabetes mellitus with diabetic chronic kidney disease: Secondary | ICD-10-CM | POA: Diagnosis not present

## 2018-07-27 DIAGNOSIS — I2510 Atherosclerotic heart disease of native coronary artery without angina pectoris: Secondary | ICD-10-CM | POA: Diagnosis not present

## 2018-07-29 DIAGNOSIS — I130 Hypertensive heart and chronic kidney disease with heart failure and stage 1 through stage 4 chronic kidney disease, or unspecified chronic kidney disease: Secondary | ICD-10-CM | POA: Diagnosis not present

## 2018-07-29 DIAGNOSIS — I5043 Acute on chronic combined systolic (congestive) and diastolic (congestive) heart failure: Secondary | ICD-10-CM | POA: Diagnosis not present

## 2018-07-29 DIAGNOSIS — E1122 Type 2 diabetes mellitus with diabetic chronic kidney disease: Secondary | ICD-10-CM | POA: Diagnosis not present

## 2018-07-29 DIAGNOSIS — A420 Pulmonary actinomycosis: Secondary | ICD-10-CM | POA: Diagnosis not present

## 2018-07-29 DIAGNOSIS — I2510 Atherosclerotic heart disease of native coronary artery without angina pectoris: Secondary | ICD-10-CM | POA: Diagnosis not present

## 2018-07-29 DIAGNOSIS — J440 Chronic obstructive pulmonary disease with acute lower respiratory infection: Secondary | ICD-10-CM | POA: Diagnosis not present

## 2018-07-30 ENCOUNTER — Other Ambulatory Visit (HOSPITAL_COMMUNITY): Payer: Self-pay | Admitting: Adult Health Nurse Practitioner

## 2018-07-30 ENCOUNTER — Encounter (HOSPITAL_COMMUNITY): Payer: Self-pay | Admitting: Emergency Medicine

## 2018-07-30 ENCOUNTER — Inpatient Hospital Stay (HOSPITAL_COMMUNITY)
Admission: EM | Admit: 2018-07-30 | Discharge: 2018-08-03 | DRG: 378 | Disposition: A | Discharge status: Home Health | Payer: Medicare Other | Attending: Family Medicine | Admitting: Family Medicine

## 2018-07-30 ENCOUNTER — Ambulatory Visit (HOSPITAL_COMMUNITY)
Admission: RE | Admit: 2018-07-30 | Discharge: 2018-07-30 | Disposition: A | Discharge status: Home / Self Care | Payer: Medicare Other | Source: Ambulatory Visit | Attending: Adult Health Nurse Practitioner | Admitting: Adult Health Nurse Practitioner

## 2018-07-30 ENCOUNTER — Other Ambulatory Visit: Payer: Self-pay

## 2018-07-30 DIAGNOSIS — Z7901 Long term (current) use of anticoagulants: Secondary | ICD-10-CM

## 2018-07-30 DIAGNOSIS — J9611 Chronic respiratory failure with hypoxia: Secondary | ICD-10-CM | POA: Diagnosis present

## 2018-07-30 DIAGNOSIS — I5022 Chronic systolic (congestive) heart failure: Secondary | ICD-10-CM | POA: Diagnosis present

## 2018-07-30 DIAGNOSIS — I5032 Chronic diastolic (congestive) heart failure: Secondary | ICD-10-CM | POA: Diagnosis not present

## 2018-07-30 DIAGNOSIS — I480 Paroxysmal atrial fibrillation: Secondary | ICD-10-CM | POA: Diagnosis present

## 2018-07-30 DIAGNOSIS — R739 Hyperglycemia, unspecified: Secondary | ICD-10-CM | POA: Diagnosis not present

## 2018-07-30 DIAGNOSIS — Z9981 Dependence on supplemental oxygen: Secondary | ICD-10-CM | POA: Diagnosis not present

## 2018-07-30 DIAGNOSIS — J9612 Chronic respiratory failure with hypercapnia: Secondary | ICD-10-CM | POA: Diagnosis present

## 2018-07-30 DIAGNOSIS — D649 Anemia, unspecified: Secondary | ICD-10-CM | POA: Diagnosis present

## 2018-07-30 DIAGNOSIS — E785 Hyperlipidemia, unspecified: Secondary | ICD-10-CM | POA: Diagnosis present

## 2018-07-30 DIAGNOSIS — K922 Gastrointestinal hemorrhage, unspecified: Secondary | ICD-10-CM | POA: Diagnosis present

## 2018-07-30 DIAGNOSIS — Z950 Presence of cardiac pacemaker: Secondary | ICD-10-CM

## 2018-07-30 DIAGNOSIS — J42 Unspecified chronic bronchitis: Secondary | ICD-10-CM | POA: Insufficient documentation

## 2018-07-30 DIAGNOSIS — I495 Sick sinus syndrome: Secondary | ICD-10-CM | POA: Diagnosis present

## 2018-07-30 DIAGNOSIS — E1159 Type 2 diabetes mellitus with other circulatory complications: Secondary | ICD-10-CM | POA: Diagnosis not present

## 2018-07-30 DIAGNOSIS — J449 Chronic obstructive pulmonary disease, unspecified: Secondary | ICD-10-CM | POA: Diagnosis present

## 2018-07-30 DIAGNOSIS — K2950 Unspecified chronic gastritis without bleeding: Secondary | ICD-10-CM | POA: Diagnosis not present

## 2018-07-30 DIAGNOSIS — I252 Old myocardial infarction: Secondary | ICD-10-CM

## 2018-07-30 DIAGNOSIS — K921 Melena: Secondary | ICD-10-CM | POA: Diagnosis present

## 2018-07-30 DIAGNOSIS — E119 Type 2 diabetes mellitus without complications: Secondary | ICD-10-CM | POA: Diagnosis present

## 2018-07-30 DIAGNOSIS — Z955 Presence of coronary angioplasty implant and graft: Secondary | ICD-10-CM | POA: Diagnosis not present

## 2018-07-30 DIAGNOSIS — R0602 Shortness of breath: Secondary | ICD-10-CM | POA: Diagnosis not present

## 2018-07-30 DIAGNOSIS — K222 Esophageal obstruction: Secondary | ICD-10-CM | POA: Diagnosis not present

## 2018-07-30 DIAGNOSIS — I110 Hypertensive heart disease with heart failure: Secondary | ICD-10-CM | POA: Diagnosis present

## 2018-07-30 DIAGNOSIS — F410 Panic disorder [episodic paroxysmal anxiety] without agoraphobia: Secondary | ICD-10-CM | POA: Diagnosis not present

## 2018-07-30 DIAGNOSIS — J918 Pleural effusion in other conditions classified elsewhere: Secondary | ICD-10-CM | POA: Diagnosis not present

## 2018-07-30 DIAGNOSIS — E782 Mixed hyperlipidemia: Secondary | ICD-10-CM | POA: Diagnosis not present

## 2018-07-30 DIAGNOSIS — D62 Acute posthemorrhagic anemia: Secondary | ICD-10-CM | POA: Diagnosis present

## 2018-07-30 DIAGNOSIS — K3189 Other diseases of stomach and duodenum: Secondary | ICD-10-CM | POA: Diagnosis not present

## 2018-07-30 DIAGNOSIS — Z9049 Acquired absence of other specified parts of digestive tract: Secondary | ICD-10-CM | POA: Diagnosis not present

## 2018-07-30 DIAGNOSIS — Z87891 Personal history of nicotine dependence: Secondary | ICD-10-CM

## 2018-07-30 DIAGNOSIS — J438 Other emphysema: Secondary | ICD-10-CM | POA: Diagnosis not present

## 2018-07-30 DIAGNOSIS — I10 Essential (primary) hypertension: Secondary | ICD-10-CM | POA: Diagnosis not present

## 2018-07-30 DIAGNOSIS — I4891 Unspecified atrial fibrillation: Secondary | ICD-10-CM | POA: Diagnosis not present

## 2018-07-30 DIAGNOSIS — Z888 Allergy status to other drugs, medicaments and biological substances status: Secondary | ICD-10-CM

## 2018-07-30 DIAGNOSIS — Z79899 Other long term (current) drug therapy: Secondary | ICD-10-CM

## 2018-07-30 DIAGNOSIS — I2510 Atherosclerotic heart disease of native coronary artery without angina pectoris: Secondary | ICD-10-CM | POA: Diagnosis present

## 2018-07-30 DIAGNOSIS — F331 Major depressive disorder, recurrent, moderate: Secondary | ICD-10-CM | POA: Diagnosis not present

## 2018-07-30 DIAGNOSIS — J90 Pleural effusion, not elsewhere classified: Secondary | ICD-10-CM | POA: Diagnosis not present

## 2018-07-30 DIAGNOSIS — Z6826 Body mass index (BMI) 26.0-26.9, adult: Secondary | ICD-10-CM | POA: Diagnosis not present

## 2018-07-30 DIAGNOSIS — K219 Gastro-esophageal reflux disease without esophagitis: Secondary | ICD-10-CM | POA: Diagnosis present

## 2018-07-30 DIAGNOSIS — K319 Disease of stomach and duodenum, unspecified: Secondary | ICD-10-CM | POA: Diagnosis present

## 2018-07-30 DIAGNOSIS — Z885 Allergy status to narcotic agent status: Secondary | ICD-10-CM

## 2018-07-30 LAB — CBC WITH DIFFERENTIAL/PLATELET
Abs Immature Granulocytes: 0.02 10*3/uL (ref 0.00–0.07)
BASOS ABS: 0 10*3/uL (ref 0.0–0.1)
Basophils Relative: 0 %
EOS ABS: 0 10*3/uL (ref 0.0–0.5)
Eosinophils Relative: 1 %
HEMATOCRIT: 23.5 % — AB (ref 36.0–46.0)
Hemoglobin: 6.2 g/dL — CL (ref 12.0–15.0)
IMMATURE GRANULOCYTES: 0 %
LYMPHS ABS: 1.3 10*3/uL (ref 0.7–4.0)
Lymphocytes Relative: 21 %
MCH: 22.1 pg — ABNORMAL LOW (ref 26.0–34.0)
MCHC: 26.4 g/dL — ABNORMAL LOW (ref 30.0–36.0)
MCV: 83.6 fL (ref 80.0–100.0)
Monocytes Absolute: 0.7 10*3/uL (ref 0.1–1.0)
Monocytes Relative: 12 %
NRBC: 0 % (ref 0.0–0.2)
Neutro Abs: 4 10*3/uL (ref 1.7–7.7)
Neutrophils Relative %: 66 %
Platelets: 282 10*3/uL (ref 150–400)
RBC: 2.81 MIL/uL — AB (ref 3.87–5.11)
RDW: 16 % — AB (ref 11.5–15.5)
WBC: 6.1 10*3/uL (ref 4.0–10.5)

## 2018-07-30 LAB — ABO/RH: ABO/RH(D): B NEG

## 2018-07-30 LAB — MAGNESIUM: Magnesium: 2.2 mg/dL (ref 1.7–2.4)

## 2018-07-30 LAB — BASIC METABOLIC PANEL
ANION GAP: 9 (ref 5–15)
BUN: 31 mg/dL — ABNORMAL HIGH (ref 8–23)
CALCIUM: 8.7 mg/dL — AB (ref 8.9–10.3)
CO2: 30 mmol/L (ref 22–32)
CREATININE: 1.25 mg/dL — AB (ref 0.44–1.00)
Chloride: 97 mmol/L — ABNORMAL LOW (ref 98–111)
GFR, EST AFRICAN AMERICAN: 44 mL/min — AB (ref >60)
GFR, EST NON AFRICAN AMERICAN: 38 mL/min — AB (ref >60)
Glucose, Bld: 108 mg/dL — ABNORMAL HIGH (ref 70–99)
Potassium: 4.1 mmol/L (ref 3.5–5.1)
SODIUM: 136 mmol/L (ref 135–145)

## 2018-07-30 LAB — HEPATIC FUNCTION PANEL
ALT: 20 U/L (ref 0–44)
AST: 17 U/L (ref 15–41)
Albumin: 3.6 g/dL (ref 3.5–5.0)
Alkaline Phosphatase: 36 U/L — ABNORMAL LOW (ref 38–126)
BILIRUBIN DIRECT: 0.1 mg/dL (ref 0.0–0.2)
BILIRUBIN INDIRECT: 0.7 mg/dL (ref 0.3–0.9)
TOTAL PROTEIN: 5.9 g/dL — AB (ref 6.5–8.1)
Total Bilirubin: 0.8 mg/dL (ref 0.3–1.2)

## 2018-07-30 LAB — TROPONIN I

## 2018-07-30 LAB — POC OCCULT BLOOD, ED: FECAL OCCULT BLD: POSITIVE — AB

## 2018-07-30 LAB — BRAIN NATRIURETIC PEPTIDE: B Natriuretic Peptide: 1020 pg/mL — ABNORMAL HIGH (ref 0.0–100.0)

## 2018-07-30 LAB — PREPARE RBC (CROSSMATCH)

## 2018-07-30 MED ORDER — ONDANSETRON HCL 4 MG PO TABS: 4.0000 mg | ORAL_TABLET | Freq: Four times a day (QID) | ORAL | Status: DC | PRN

## 2018-07-30 MED ORDER — PANTOPRAZOLE SODIUM 40 MG IV SOLR
40.0000 mg | Freq: Once | INTRAVENOUS | Status: AC
  Administered 2018-07-30: 40 mg via INTRAVENOUS
  Filled 2018-07-30: qty 40

## 2018-07-30 MED ORDER — PANTOPRAZOLE SODIUM 40 MG IV SOLR: 40.0000 mg | Freq: Two times a day (BID) | INTRAVENOUS | Status: DC

## 2018-07-30 MED ORDER — SODIUM CHLORIDE 0.9 % IV SOLN
8.0000 mg/h | INTRAVENOUS | Status: DC
  Administered 2018-07-30 – 2018-08-02 (×7): 8 mg/h via INTRAVENOUS
  Filled 2018-07-30 (×9): qty 80

## 2018-07-30 MED ORDER — PANTOPRAZOLE SODIUM 40 MG IV SOLR
INTRAVENOUS | Status: AC
  Filled 2018-07-30: qty 80

## 2018-07-30 MED ORDER — SODIUM CHLORIDE 0.9% IV SOLUTION
Freq: Once | INTRAVENOUS | Status: AC
  Administered 2018-07-30: 22:00:00 via INTRAVENOUS

## 2018-07-30 MED ORDER — ONDANSETRON HCL 4 MG/2ML IJ SOLN: 4.0000 mg | Freq: Four times a day (QID) | INTRAMUSCULAR | Status: DC | PRN

## 2018-07-30 NOTE — ED Provider Notes (Signed)
[facility] EMERGENCY DEPARTMENT Provider Note   CSN: [id number] Arrival date & time: 07/30/18  1702     History   Chief Complaint Chief Complaint  Patient presents with  . Shortness of Breath    HPI [name] is a 84 y.o. female.  Patient complains of shortness of breath and weakness.  The history is provided by the patient. No language interpreter was used.  Shortness of Breath  This is a new problem. The problem occurs continuously.The current episode started more than 2 days ago. The problem has not changed since onset.Pertinent negatives include no fever, no headaches, no cough, no chest pain, no abdominal pain and no rash. It is unknown what precipitated the problem. Risk factors: Coronary artery disease. She has tried nothing for the symptoms. The treatment provided no relief. She has had prior hospitalizations.    Past Medical History:  Diagnosis Date  . Asthma   . COPD (chronic obstructive pulmonary disease) (HCC)    Oxygen dependent  . Coronary atherosclerosis of native coronary artery   . Essential hypertension, benign   . History of GI bleed    Ulcer  . Hyperlipidemia   . Old inferior wall myocardial infarction 1999   [facility]  . PAF (paroxysmal atrial fibrillation) (HCC)   . Sinus node dysfunction (HCC)    Medtronic PPM  . Type 2 diabetes mellitus [facility])     Patient Active Problem List   Diagnosis Date Noted  . UGIB (upper gastrointestinal bleed) 07/30/2018  . Pulmonary actinomycosis (HCC) 05/25/2018  . Empyema, left (HCC)   . Pressure injury of skin 05/23/2018  . Acute respiratory failure with hypoxia and hypercarbia (HCC) 05/23/2018  . Acute metabolic encephalopathy 05/23/2018  . Lobar pneumonia (HCC) 05/23/2018  . Acute on chronic combined systolic and diastolic CHF (congestive heart failure) (HCC) 05/21/2018  . Chronic respiratory failure with hypoxia (HCC) 05/21/2018  . UTI (urinary tract infection) 06/25/2016  . Symptomatic bradycardia  06/21/2016  . Digoxin toxicity 06/21/2016  . Chronic respiratory failure with hypoxia and hypercapnia (HCC) 06/13/2016  . Diarrhea 04/07/2016  . Anemia 04/07/2016  . Localized edema 04/07/2016  . Combined congestive systolic and diastolic heart failure (HCC) 03/20/2016  . Palliative care encounter   . Goals of care, counseling/discussion   . Dyspnea   . Atrial fibrillation with RVR (HCC)   . Pleural effusion on left 03/09/2016  . Atrial fibrillation (HCC) 03/09/2016  . Hyperlipidemia   . Type 2 diabetes mellitus (HCC) 12/26/2009  . HYPERLIPIDEMIA 12/26/2009  . Essential hypertension, benign 12/26/2009  . Coronary atherosclerosis of native coronary artery 12/26/2009  . COPD (chronic obstructive pulmonary disease) (HCC) 12/26/2009    Past Surgical History:  Procedure Laterality Date  . Cesareen section    . CHOLECYSTECTOMY    . COLONOSCOPY  09/2004   RMR: For obscure GI bleed. Left-sided diverticula, otherwise normal ileocolonoscopy  . Coronary artery stent  1999   BMS x 3 RCA  ([facility]), PTCRA RC4  . EP IMPLANTABLE DEVICE N/A 06/26/2016   Procedure: Pacemaker Implant;  Surgeon: Will Jorja Loa, MD;  Location: [facility] INVASIVE CV LAB;  Service: Cardiovascular;  Laterality: N/A;  . ESOPHAGOGASTRODUODENOSCOPY  11 2005   RMR: For melena, normal exam.  . EXPLORATORY LAPAROTOMY    . GIVENS CAPSULE STUDY  08/2004   official report unavailable, but reported couple of AVMs, nonbleeding  . IR PERC PLEURAL DRAIN W/INDWELL CATH W/IMG GUIDE  05/27/2018  . PARTIAL COLECTOMY  1977   blockage/gangrene? unclear if  small bowel or colon  . THORACENTESIS Left 03/10/16   1 L transudative fluid     OB History   None      Home Medications    Prior to Admission medications   Medication Sig Start Date End Date Taking? Authorizing Provider  apixaban (ELIQUIS) 5 MG TABS tablet Take 1 tablet (5 mg total) by mouth 2 (two) times daily. 03/20/16  Yes Erick Blinks, MD  bisoprolol (ZEBETA) 5 MG tablet  Take 0.5 tablets (2.5 mg total) by mouth daily. Pt needs appointment for further refills. 06/10/18  Yes Erick Blinks, MD  escitalopram (LEXAPRO) 20 MG tablet Take 20 mg by mouth at bedtime.    Yes [provider]  furosemide (LASIX) 40 MG tablet Take 1 tablet (40 mg total) by mouth 2 (two) times daily. Daily at 0800 am 06/10/18  Yes Erick Blinks, MD  ipratropium-albuterol (DUONEB) 0.5-2.5 (3) MG/3ML SOLN Take 3 mLs by nebulization 3 (three) times daily.   Yes [provider]  LORazepam (ATIVAN) 0.5 MG tablet Take 1 tablet (0.5 mg total) by mouth at bedtime. Take one tablet by mouth once daily 06/14/18  Yes Lassen, Arlo C, PA-C  Melatonin 10 MG TABS Take 1 tablet by mouth at bedtime.   Yes [provider]  Multiple Vitamins-Minerals (CENTRUM SILVER 50+WOMEN PO) Take 1 tablet by mouth daily.   Yes [provider]  nitroGLYCERIN (NITROSTAT) 0.4 MG SL tablet Place 1 tablet (0.4 mg total) under the tongue every 5 (five) minutes as needed for chest pain. 04/24/14  Yes Jonelle Sidle, MD  omeprazole (PRILOSEC) 20 MG capsule Take 1 capsule (20 mg total) by mouth 2 (two) times daily before a meal. 06/12/16  Yes Nyoka Cowden, MD  potassium chloride (K-DUR,KLOR-CON) 10 MEQ tablet Take 10 mEq by mouth 2 (two) times daily.   Yes [provider]  vitamin B-12 (CYANOCOBALAMIN) 1000 MCG tablet Take 1,000 mcg by mouth daily.   Yes [provider]    Family History Family History  Problem Relation Age of Onset  . Diabetes Mother   . Diabetes Father   . Heart disease Father   . Cancer Sister        unknown  . Cancer Brother        unknown  . Diabetes Brother   . Colon cancer Neg Hx     Social History Social History   Tobacco Use  . Smoking status: Former Smoker    Packs/day: 2.00    Years: 40.00    Pack years: 80.00    Types: Cigarettes    Last attempt to quit: 01/13/1998    Years since quitting: 20.5  . Smokeless tobacco: Never Used    Substance Use Topics  . Alcohol use: No    Alcohol/week: 0.0 standard drinks  . Drug use: No     Allergies   Codeine and Heparin   Review of Systems Review of Systems  Constitutional: Negative for appetite change, fatigue and fever.  HENT: Negative for congestion, ear discharge and sinus pressure.   Eyes: Negative for discharge.  Respiratory: Positive for shortness of breath. Negative for cough.   Cardiovascular: Negative for chest pain.  Gastrointestinal: Negative for abdominal pain and diarrhea.  Genitourinary: Negative for frequency and hematuria.  Musculoskeletal: Negative for back pain.  Skin: Negative for rash.  Neurological: Negative for seizures and headaches.  Psychiatric/Behavioral: Negative for hallucinations.     Physical Exam Updated Vital Signs BP 118/60   Pulse 98  Temp 97.6 F (36.4 C) (Oral)   Resp 19   Ht 5\' 4"  (1.626 m)   Wt 69.4 kg   SpO2 100%   BMI 26.26 kg/m   Physical Exam  Constitutional: She is oriented to person, place, and time. She appears well-developed.  HENT:  Head: Normocephalic.  Eyes: Conjunctivae and EOM are normal. No scleral icterus.  Neck: Neck supple. No thyromegaly present.  Cardiovascular: Normal rate and regular rhythm. Exam reveals no gallop and no friction rub.  No murmur heard. Pulmonary/Chest: No stridor. She has no wheezes. She has no rales. She exhibits no tenderness.  Abdominal: She exhibits no distension. There is no tenderness. There is no rebound.  Genitourinary:  Genitourinary Comments: Rectal exam heme positive brown stool  Musculoskeletal: Normal range of motion. She exhibits no edema.  Lymphadenopathy:    She has no cervical adenopathy.  Neurological: She is oriented to person, place, and time. She exhibits normal muscle tone. Coordination normal.  Skin: No rash noted. No erythema.  Psychiatric: She has a normal mood and affect. Her behavior is normal.     ED Treatments / Results  Labs (all labs  ordered are listed, but only abnormal results are displayed) Labs Reviewed  CBC WITH DIFFERENTIAL/PLATELET - Abnormal; Notable for the following components:      Result Value   RBC 2.81 (*)    Hemoglobin 6.2 (*)    HCT 23.5 (*)    MCH 22.1 (*)    MCHC 26.4 (*)    RDW 16.0 (*)    All other components within normal limits  BASIC METABOLIC PANEL - Abnormal; Notable for the following components:   Chloride 97 (*)    Glucose, Bld 108 (*)    BUN 31 (*)    Creatinine, Ser 1.25 (*)    Calcium 8.7 (*)    GFR calc non Af Amer 38 (*)    GFR calc Af Amer 44 (*)    All other components within normal limits  BRAIN NATRIURETIC PEPTIDE - Abnormal; Notable for the following components:   B Natriuretic Peptide 1,020.0 (*)    All other components within normal limits  HEPATIC FUNCTION PANEL - Abnormal; Notable for the following components:   Total Protein 5.9 (*)    Alkaline Phosphatase 36 (*)    All other components within normal limits  POC OCCULT BLOOD, ED - Abnormal; Notable for the following components:   Fecal Occult Bld POSITIVE (*)    All other components within normal limits  TROPONIN I  OCCULT BLOOD X 1 CARD TO LAB, STOOL  MAGNESIUM  HEMOGLOBIN AND HEMATOCRIT, BLOOD  CBC WITH DIFFERENTIAL/PLATELET  BASIC METABOLIC PANEL  TYPE AND SCREEN  PREPARE RBC (CROSSMATCH)    EKG None  Radiology Dg Chest 2 View  Result Date: 07/30/2018 CLINICAL DATA:  Shortness of breath for 2 weeks EXAM: CHEST - 2 VIEW COMPARISON:  06/17/2018 FINDINGS: Small bilateral pleural effusions, left greater than right. Bilateral diffuse mild interstitial thickening. No pneumothorax. No focal consolidation. Stable cardiomegaly. Dual lead cardiac pacemaker. No acute osseous abnormality. IMPRESSION: 1. Findings concerning for mild CHF. Electronically Signed   By: Elige Ko   On: 07/30/2018 16:38    Procedures Procedures (including critical care time)  Medications Ordered in ED Medications  0.9 %  sodium  chloride infusion (Manually program via Guardrails IV Fluids) (has no administration in time range)  pantoprazole (PROTONIX) 80 mg in sodium chloride 0.9 % 250 mL (0.32 mg/mL) infusion (has no administration in  time range)  pantoprazole (PROTONIX) injection 40 mg (has no administration in time range)  pantoprazole (PROTONIX) injection 40 mg (has no administration in time range)  pantoprazole (PROTONIX) injection 40 mg (40 mg Intravenous Given 07/30/18 2007)    CRITICAL CARE Performed by: Bethann Berkshire Total critical care time:25minutes Critical care time was exclusive of separately billable procedures and treating other patients. Critical care was necessary to treat or prevent imminent or life-threatening deterioration. Critical care was time spent personally by me on the following activities: development of treatment plan with patient and/or surrogate as well as nursing, discussions with consultants, evaluation of patient's response to treatment, examination of patient, obtaining history from patient or surrogate, ordering and performing treatments and interventions, ordering and review of laboratory studies, ordering and review of radiographic studies, pulse oximetry and re-evaluation of patient's condition.  Initial Impression / Assessment and Plan / ED Course  I have reviewed the triage vital signs and the nursing notes.  Pertinent labs & imaging results that were available during my care of the patient were reviewed by me and considered in my medical decision making (see chart for details).     Patient with upper GI bleed.  She will be transfused and admitted to the hospital  Final Clinical Impressions(s) / ED Diagnoses   Final diagnoses:  Upper GI bleed    ED Discharge Orders    None       Bethann Berkshire, MD 07/30/18 2032

## 2018-07-30 NOTE — ED Triage Notes (Signed)
Patient complaining of shortness of breath x 2 weeks. Patient is on O2 2L via nasal canula continuously. Denies chest pain. Patient was sent here by Roe Rutherford PA for chest x-ray, which patient received prior to checking in to ER.

## 2018-07-30 NOTE — H&P (Signed)
History and Physical    [name] WGN:[id number] DOB: November 24, 1930 DOA: 07/30/2018  PCP: Benita Stabile, MD   Patient coming from: Home.  I have personally briefly reviewed patient's old medical records in [facility]  Chief Complaint: Shortness of breath.  HPI: [name] is a 84 y.o. female with medical history significant of asthma, COPD on home oxygen, CAD, history of old inferior wall MI, essential hypertension, history of PUD, history of previous GI bleed, hyperlipidemia, sick sinus syndrome, history of pacemaker placement, type 2 diabetes, paroxysmal atrial fibrillation who is coming to the emergency department with 2 weeks of progressively worse dyspnea, several days of worsening pitting edema of the lower extremities, melena, pale skin per daughter who was sent from her PCP office to the emergency department due to dyspnea.  She denies fever, chills, headache, rhinorrhea, sore throat, productive cough, wheezing, hemoptysis, chest pain, palpitations, dizziness, diaphoresis, PND orthopnea.  No abdominal pain, nausea, emesis, diarrhea, constipation or hematochezia.  She denies dysuria, frequency or hematuria.  No polyuria, polydipsia, polyphagia or blurred vision.  ED Course: Initial vital signs temperature 97.6 F, pulse 95, respirations 24, blood pressure 130/79 mmHg and O2 sat 100% on room air.  The patient was started on a 2 PRBC transfusion in the ED.  White count was 6.1 with a normal differential, hemoglobin 6.2 g/dL and platelets 784.  Fecal occult blood was positive.  Sodium 136, potassium 4.1, chloride 97 and CO2 30 mmol/L.  BUN 31, creatinine 1.25 and glucose 180 mg/dL.  Her LFTs show a decreased total albumin of 5.9 g/dL and an alkaline phosphatase of 36 units/L, all other values are within normal limits.  Her chest radiograph showed mild CHF.  Review of Systems: As per HPI otherwise 10 point review of systems negative.   Past Medical History:  Diagnosis Date    . Asthma   . COPD (chronic obstructive pulmonary disease) (HCC)    Oxygen dependent  . Coronary atherosclerosis of native coronary artery   . Essential hypertension, benign   . History of GI bleed    Ulcer  . Hyperlipidemia   . Old inferior wall myocardial infarction 1999   [facility]  . PAF (paroxysmal atrial fibrillation) (HCC)   . Sinus node dysfunction (HCC)    Medtronic PPM  . Type 2 diabetes mellitus (HCC)     Past Surgical History:  Procedure Laterality Date  . Cesareen section    . CHOLECYSTECTOMY    . COLONOSCOPY  09/2004   RMR: For obscure GI bleed. Left-sided diverticula, otherwise normal ileocolonoscopy  . Coronary artery stent  1999   BMS x 3 RCA  ([facility]), PTCRA RC4  . EP IMPLANTABLE DEVICE N/A 06/26/2016   Procedure: Pacemaker Implant;  Surgeon: Will Jorja Loa, MD;  Location: [facility] INVASIVE CV LAB;  Service: Cardiovascular;  Laterality: N/A;  . ESOPHAGOGASTRODUODENOSCOPY  11 2005   RMR: For melena, normal exam.  . EXPLORATORY LAPAROTOMY    . GIVENS CAPSULE STUDY  08/2004   official report unavailable, but reported couple of AVMs, nonbleeding  . IR PERC PLEURAL DRAIN W/INDWELL CATH W/IMG GUIDE  05/27/2018  . PARTIAL COLECTOMY  1977   blockage/gangrene? unclear if small bowel or colon  . THORACENTESIS Left 03/10/16   1 L transudative fluid     reports that she quit smoking about 20 years ago. Her smoking use included cigarettes. She has a 80.00 pack-year smoking history. She has never used smokeless tobacco. She reports that she does not  drink alcohol or use drugs.  Allergies  Allergen Reactions  . Codeine Nausea Only  . Heparin Other (See Comments)    Causes internal bleeding     Family History  Problem Relation Age of Onset  . Diabetes Mother   . Diabetes Father   . Heart disease Father   . Cancer Sister        unknown  . Cancer Brother        unknown  . Diabetes Brother   . Colon cancer Neg Hx    Prior to Admission medications   Medication Sig Start  Date End Date Taking? Authorizing Provider  apixaban (ELIQUIS) 5 MG TABS tablet Take 1 tablet (5 mg total) by mouth 2 (two) times daily. 03/20/16  Yes Erick Blinks, MD  bisoprolol (ZEBETA) 5 MG tablet Take 0.5 tablets (2.5 mg total) by mouth daily. Pt needs appointment for further refills. 06/10/18  Yes Erick Blinks, MD  escitalopram (LEXAPRO) 20 MG tablet Take 20 mg by mouth at bedtime.    Yes [provider]  furosemide (LASIX) 40 MG tablet Take 1 tablet (40 mg total) by mouth 2 (two) times daily. Daily at 0800 am 06/10/18  Yes Erick Blinks, MD  ipratropium-albuterol (DUONEB) 0.5-2.5 (3) MG/3ML SOLN Take 3 mLs by nebulization 3 (three) times daily.   Yes [provider]  LORazepam (ATIVAN) 0.5 MG tablet Take 1 tablet (0.5 mg total) by mouth at bedtime. Take one tablet by mouth once daily 06/14/18  Yes Lassen, Arlo C, PA-C  Melatonin 10 MG TABS Take 1 tablet by mouth at bedtime.   Yes [provider]  Multiple Vitamins-Minerals (CENTRUM SILVER 50+WOMEN PO) Take 1 tablet by mouth daily.   Yes [provider]  nitroGLYCERIN (NITROSTAT) 0.4 MG SL tablet Place 1 tablet (0.4 mg total) under the tongue every 5 (five) minutes as needed for chest pain. 04/24/14  Yes Jonelle Sidle, MD  omeprazole (PRILOSEC) 20 MG capsule Take 1 capsule (20 mg total) by mouth 2 (two) times daily before a meal. 06/12/16  Yes Nyoka Cowden, MD  potassium chloride (K-DUR,KLOR-CON) 10 MEQ tablet Take 10 mEq by mouth 2 (two) times daily.   Yes [provider]  vitamin B-12 (CYANOCOBALAMIN) 1000 MCG tablet Take 1,000 mcg by mouth daily.   Yes [provider]    Physical Exam: Vitals:   07/30/18 2218 07/30/18 2230 07/30/18 2246 07/30/18 2300  BP: (!) 91/49 (!) 91/56 (!) 95/52 (!) 97/48  Pulse: 98 92  91  Resp: (!) 23 (!) 25  (!) 21  Temp: 99.2 F (37.3 C)  99.1 F (37.3 C)   TempSrc: Oral  Oral   SpO2: 100% 100%  100%  Weight:      Height:         Constitutional: NAD, calm, comfortable Eyes: PERRL, lids and conjunctivae are pale. ENMT: Mucous membranes are mildly dry. Posterior pharynx clear of any exudate or lesions. Neck: normal, supple, no masses, no thyromegaly Respiratory: clear to auscultation bilaterally, no wheezing, no crackles. Normal respiratory effort. No accessory muscle use.  Cardiovascular: Irregularly irregular, no murmurs / rubs / gallops.  2+ lower extremities pitting edema. 2+ pedal pulses. No carotid bruits.  Abdomen: Soft, no tenderness, no masses palpated. No hepatosplenomegaly. Bowel sounds positive.  Musculoskeletal: no clubbing / cyanosis.  Lead good ROM, no contractures. Normal muscle tone.  Skin: Multiple areas of ecchymosis on extremities.  Hyperpigmented macules on upper back Neurologic: CN 2-12 grossly intact. Sensation intact, DTR  normal.  Generalized weakness. Psychiatric: Normal judgment and insight. Alert and oriented x 3. Normal mood.   Labs on Admission: I have personally reviewed following labs and imaging studies  CBC: Recent Labs  Lab 07/30/18 1839  WBC 6.1  NEUTROABS 4.0  HGB 6.2*  HCT 23.5*  MCV 83.6  PLT 282   Basic Metabolic Panel: Recent Labs  Lab 07/30/18 1839 07/30/18 1950  NA 136  --   K 4.1  --   CL 97*  --   CO2 30  --   GLUCOSE 108*  --   BUN 31*  --   CREATININE 1.25*  --   CALCIUM 8.7*  --   MG  --  2.2   GFR: Estimated Creatinine Clearance: 31.5 mL/min (A) (by C-G formula based on SCr of 1.25 mg/dL (H)). Liver Function Tests: Recent Labs  Lab 07/30/18 1839  AST 17  ALT 20  ALKPHOS 36*  BILITOT 0.8  PROT 5.9*  ALBUMIN 3.6   No results for input(s): LIPASE, AMYLASE in the last 168 hours. No results for input(s): AMMONIA in the last 168 hours. Coagulation Profile: No results for input(s): INR, PROTIME in the last 168 hours. Cardiac Enzymes: Recent Labs  Lab 07/30/18 1839  TROPONINI <0.03   BNP (last 3 results) No results for input(s): PROBNP  in the last 8760 hours. HbA1C: No results for input(s): HGBA1C in the last 72 hours. CBG: No results for input(s): GLUCAP in the last 168 hours. Lipid Profile: No results for input(s): CHOL, HDL, LDLCALC, TRIG, CHOLHDL, LDLDIRECT in the last 72 hours. Thyroid Function Tests: No results for input(s): TSH, T4TOTAL, FREET4, T3FREE, THYROIDAB in the last 72 hours. Anemia Panel: No results for input(s): VITAMINB12, FOLATE, FERRITIN, TIBC, IRON, RETICCTPCT in the last 72 hours. Urine analysis:    Component Value Date/Time   COLORURINE YELLOW 05/23/2018 1250   APPEARANCEUR HAZY (A) 05/23/2018 1250   LABSPEC 1.018 05/23/2018 1250   PHURINE 5.0 05/23/2018 1250   GLUCOSEU NEGATIVE 05/23/2018 1250   HGBUR NEGATIVE 05/23/2018 1250   BILIRUBINUR NEGATIVE 05/23/2018 1250   KETONESUR 5 (A) 05/23/2018 1250   PROTEINUR 30 (A) 05/23/2018 1250   NITRITE NEGATIVE 05/23/2018 1250   LEUKOCYTESUR NEGATIVE 05/23/2018 1250    Radiological Exams on Admission: Dg Chest 2 View  Result Date: 07/30/2018 CLINICAL DATA:  Shortness of breath for 2 weeks EXAM: CHEST - 2 VIEW COMPARISON:  06/17/2018 FINDINGS: Small bilateral pleural effusions, left greater than right. Bilateral diffuse mild interstitial thickening. No pneumothorax. No focal consolidation. Stable cardiomegaly. Dual lead cardiac pacemaker. No acute osseous abnormality. IMPRESSION: 1. Findings concerning for mild CHF. Electronically Signed   By: Elige Ko   On: 07/30/2018 16:38    EKG: Independently reviewed.  No EKG obtained.  Assessment/Plan Principal Problem:   UGIB (upper gastrointestinal bleed) Admit to telemetry/inpatient. Keep n.p.o. Continue Protonix infusion. Transfuse 2 PRBC overnight. Monitor hematocrit and hemoglobin. Consult gastroenterology in the morning.  Active Problems:   Anemia As above. Monitor H&H.    Type 2 diabetes mellitus (HCC) Currently n.p.o. CBG monitoring every 6 hours    Essential hypertension,  benign Hold furosemide. Hold bisoprolol. Metoprolol 2.5 mg p.o.    Coronary atherosclerosis of native coronary artery Hold anticoagulation. Switch bisoprolol to equivalent dosage IV metoprolol.    COPD (chronic obstructive pulmonary disease) (HCC) No wheezing at this time. Dyspnea is secondary to blood loss. Supplemental oxygen as needed. Xopenex plus ipratropium every 6 hours as needed.    Hyperlipidemia Not  on medical treatment at this time.    Atrial fibrillation (HCC) CHA?DS?-VASc Score of at least 7. Anticoagulation has been held. Hold bisoprolol 2.5 mg p.o. daily. Use dose equivalent IV metoprolol 2.5 mg every 6 hours. Monitor heart rate.     DVT prophylaxis: On apixaban (held). Code Status: Full code. Family Communication: Her daughter was present in the ED room. Disposition Plan: Admit for UGIB treatment and further evaluation. Consults called: Routine gastroenterology consult. Admission status: Inpatient/telemetry.   Bobette Mo MD [facility] Pager [phone].  If 7PM-7AM, please contact night-coverage [email or web address] Password [email or web address]  07/30/2018, 11:14 PM

## 2018-07-30 NOTE — Patient Outreach (Signed)
[facility] Customer service manager [facility]) Care Management  07/30/2018  [name] [birth year] [phone]    Successful outreach with [name]. She reported increased weakness and episodes of shortness of breath over the past few days. No falls reported. She still lives alone and expressed concerns about possibly needing occasional assistance in the home. Discussed addition of Home Health Aide to Home Health services. Member aware that Home Health Aide services are temporary and agreed to notify RNCM if assistance was needed for a longer term, and [facility] SW referral is needed.   [name] was preparing for an acute appointment with Dr. Margo Aye at the time of the call. Reported that she will complete a chest x-ray following the appointment. Agreed to contact RNCM with updates or concerns prior to next outreach.  PLAN Will follow up with MD and [facility].  [name] [facility] Community Care Manager [phone]

## 2018-07-30 NOTE — ED Notes (Signed)
Date and time results received: 07/30/18 1912 (use smartphrase ".now" to insert current time)  Test: hg Critical Value: 6.2 hg  Name of Provider Notified: [place]  Orders Received? Or Actions Taken?: Orders Received - See Orders for details

## 2018-07-31 ENCOUNTER — Encounter (HOSPITAL_COMMUNITY): Payer: Self-pay

## 2018-07-31 DIAGNOSIS — I5022 Chronic systolic (congestive) heart failure: Secondary | ICD-10-CM | POA: Diagnosis present

## 2018-07-31 LAB — BASIC METABOLIC PANEL
Anion gap: 10 (ref 5–15)
BUN: 26 mg/dL — ABNORMAL HIGH (ref 8–23)
CHLORIDE: 100 mmol/L (ref 98–111)
CO2: 29 mmol/L (ref 22–32)
Calcium: 8.7 mg/dL — ABNORMAL LOW (ref 8.9–10.3)
Creatinine, Ser: 1.11 mg/dL — ABNORMAL HIGH (ref 0.44–1.00)
GFR calc Af Amer: 51 mL/min — ABNORMAL LOW (ref >60)
GFR calc non Af Amer: 44 mL/min — ABNORMAL LOW (ref >60)
GLUCOSE: 128 mg/dL — AB (ref 70–99)
Potassium: 3.8 mmol/L (ref 3.5–5.1)
Sodium: 139 mmol/L (ref 135–145)

## 2018-07-31 LAB — GLUCOSE, CAPILLARY
GLUCOSE-CAPILLARY: 173 mg/dL — AB (ref 70–99)
GLUCOSE-CAPILLARY: 146 mg/dL — AB (ref 70–99)
GLUCOSE-CAPILLARY: 131 mg/dL — AB (ref 70–99)
Glucose-Capillary: 114 mg/dL — ABNORMAL HIGH (ref 70–99)

## 2018-07-31 LAB — CBC WITH DIFFERENTIAL/PLATELET
Abs Immature Granulocytes: 0.04 10*3/uL (ref 0.00–0.07)
Basophils Absolute: 0 10*3/uL (ref 0.0–0.1)
Basophils Relative: 0 %
EOS ABS: 0.1 10*3/uL (ref 0.0–0.5)
EOS PCT: 1 %
HEMATOCRIT: 32.2 % — AB (ref 36.0–46.0)
HEMOGLOBIN: 9.1 g/dL — AB (ref 12.0–15.0)
Immature Granulocytes: 1 %
LYMPHS ABS: 1.6 10*3/uL (ref 0.7–4.0)
LYMPHS PCT: 22 %
MCH: 23.9 pg — AB (ref 26.0–34.0)
MCHC: 28.3 g/dL — ABNORMAL LOW (ref 30.0–36.0)
MCV: 84.7 fL (ref 80.0–100.0)
MONO ABS: 0.9 10*3/uL (ref 0.1–1.0)
MONOS PCT: 13 %
Neutro Abs: 4.6 10*3/uL (ref 1.7–7.7)
Neutrophils Relative %: 63 %
Platelets: 271 10*3/uL (ref 150–400)
RBC: 3.8 MIL/uL — ABNORMAL LOW (ref 3.87–5.11)
RDW: 15.9 % — ABNORMAL HIGH (ref 11.5–15.5)
WBC: 7.1 10*3/uL (ref 4.0–10.5)
nRBC: 0.4 % — ABNORMAL HIGH (ref 0.0–0.2)

## 2018-07-31 MED ORDER — LEVALBUTEROL HCL 1.25 MG/0.5ML IN NEBU
1.2500 mg | INHALATION_SOLUTION | Freq: Four times a day (QID) | RESPIRATORY_TRACT | Status: DC | PRN
  Administered 2018-07-31 – 2018-08-02 (×3): 1.25 mg via RESPIRATORY_TRACT
  Filled 2018-07-31 (×3): qty 0.5

## 2018-07-31 MED ORDER — METOPROLOL TARTRATE 5 MG/5ML IV SOLN
2.5000 mg | Freq: Four times a day (QID) | INTRAVENOUS | Status: DC
  Administered 2018-07-31 – 2018-08-02 (×10): 2.5 mg via INTRAVENOUS
  Filled 2018-07-31 (×10): qty 5

## 2018-07-31 MED ORDER — IPRATROPIUM BROMIDE 0.02 % IN SOLN
0.5000 mg | Freq: Four times a day (QID) | RESPIRATORY_TRACT | Status: DC | PRN
  Administered 2018-07-31 (×2): 0.5 mg via RESPIRATORY_TRACT
  Filled 2018-07-31 (×2): qty 2.5

## 2018-07-31 MED ORDER — IPRATROPIUM BROMIDE 0.02 % IN SOLN
0.5000 mg | Freq: Three times a day (TID) | RESPIRATORY_TRACT | Status: DC
  Administered 2018-08-01 – 2018-08-03 (×7): 0.5 mg via RESPIRATORY_TRACT
  Filled 2018-07-31 (×7): qty 2.5

## 2018-07-31 MED ORDER — LORAZEPAM 0.5 MG PO TABS
0.5000 mg | ORAL_TABLET | Freq: Two times a day (BID) | ORAL | Status: DC | PRN
  Administered 2018-07-31 – 2018-08-01 (×3): 0.5 mg via ORAL
  Filled 2018-07-31 (×4): qty 1

## 2018-07-31 MED ORDER — LEVALBUTEROL HCL 1.25 MG/0.5ML IN NEBU
1.2500 mg | INHALATION_SOLUTION | Freq: Three times a day (TID) | RESPIRATORY_TRACT | Status: DC
  Administered 2018-08-01 – 2018-08-03 (×7): 1.25 mg via RESPIRATORY_TRACT
  Filled 2018-07-31 (×7): qty 0.5

## 2018-07-31 NOTE — Consult Note (Signed)
@LOGO @   Primary Care Physician:  Benita Stabile, MD Primary Gastroenterologist:  Dr. Jena Gauss  Pre-Procedure History & Physical: HPI:  [name] is a 84 y.o. female with multiple comorbidities including atrial fibrillation on Eliquis - presented yesterday with profound fatigue, dyspnea,  hemoglobin 6.8 and Hemoccult positive stool.  Patient states she has had dark off and on for months.  Describes a vague "indigestion".  Says that reflux in spite of taking omeprazole 20 mg twice daily.  She has remained hemodynamically stable.. Hemoglobin 9.1 after 2 units transfused this morning. She denies dysphagia, nausea or vomiting.  Denies abdominal pain.  No hematochezia.  States bowel function is otherwise been normal.  Has been evaluated in the distant past for occult GI bleeding of obscure etiology with EGD, colonoscopy and small bowel capsule revealing only left sided diverticulosis and small bowel AVMs. No NSAID use in the history.  Past Medical History:  Diagnosis Date  . Asthma   . COPD (chronic obstructive pulmonary disease) (HCC)    Oxygen dependent  . Coronary atherosclerosis of native coronary artery   . Essential hypertension, benign   . History of GI bleed    Ulcer  . Hyperlipidemia   . Old inferior wall myocardial infarction 1999   [facility]  . PAF (paroxysmal atrial fibrillation) (HCC)   . Sinus node dysfunction (HCC)    [employer] PPM  . Type 2 diabetes mellitus (HCC)     Past Surgical History:  Procedure Laterality Date  . Cesareen section    . CHOLECYSTECTOMY    . COLONOSCOPY  09/2004   RMR: For obscure GI bleed. Left-sided diverticula, otherwise normal ileocolonoscopy  . Coronary artery stent  1999   BMS x 3 RCA  ([facility]), PTCRA RC4  . EP IMPLANTABLE DEVICE N/A 06/26/2016   Procedure: Pacemaker Implant;  Surgeon: Will Jorja Loa, MD;  Location: [facility];  Service: Cardiovascular;  Laterality: N/A;  . ESOPHAGOGASTRODUODENOSCOPY  11 2005   RMR: For melena,  normal exam.  . EXPLORATORY LAPAROTOMY    . GIVENS CAPSULE STUDY  08/2004   official report unavailable, but reported couple of AVMs, nonbleeding  . IR PERC PLEURAL DRAIN W/INDWELL CATH W/IMG GUIDE  05/27/2018  . PARTIAL COLECTOMY  1977   blockage/gangrene? unclear if small bowel or colon  . THORACENTESIS Left 03/10/16   1 L transudative fluid    Prior to Admission medications   Medication Sig Start Date End Date Taking? Authorizing Provider  apixaban (ELIQUIS) 5 MG TABS tablet Take 1 tablet (5 mg total) by mouth 2 (two) times daily. 03/20/16  Yes Erick Blinks, MD  bisoprolol (ZEBETA) 5 MG tablet Take 0.5 tablets (2.5 mg total) by mouth daily. Pt needs appointment for further refills. 06/10/18  Yes Erick Blinks, MD  escitalopram (LEXAPRO) 20 MG tablet Take 20 mg by mouth at bedtime.    Yes [provider]  furosemide (LASIX) 40 MG tablet Take 1 tablet (40 mg total) by mouth 2 (two) times daily. Daily at 0800 am 06/10/18  Yes Erick Blinks, MD  ipratropium-albuterol (DUONEB) 0.5-2.5 (3) MG/3ML SOLN Take 3 mLs by nebulization 3 (three) times daily.   Yes [provider]  LORazepam (ATIVAN) 0.5 MG tablet Take 1 tablet (0.5 mg total) by mouth at bedtime. Take one tablet by mouth once daily 06/14/18  Yes Lassen, Arlo C, PA-C  Melatonin 10 MG TABS Take 1 tablet by mouth at bedtime.   Yes [provider]  Multiple Vitamins-Minerals (CENTRUM SILVER  50+WOMEN PO) Take 1 tablet by mouth daily.   Yes [provider]  nitroGLYCERIN (NITROSTAT) 0.4 MG SL tablet Place 1 tablet (0.4 mg total) under the tongue every 5 (five) minutes as needed for chest pain. 04/24/14  Yes Jonelle Sidle, MD  omeprazole (PRILOSEC) 20 MG capsule Take 1 capsule (20 mg total) by mouth 2 (two) times daily before a meal. 06/12/16  Yes Nyoka Cowden, MD  potassium chloride (K-DUR,KLOR-CON) 10 MEQ tablet Take 10 mEq by mouth 2 (two) times daily.   Yes [provider]  vitamin B-12  (CYANOCOBALAMIN) 1000 MCG tablet Take 1,000 mcg by mouth daily.   Yes [provider]    Allergies as of 07/30/2018 - Review Complete 07/30/2018  Allergen Reaction Noted  . Codeine Nausea Only 01/14/2011  . Heparin Other (See Comments) 03/09/2016    Family History  Problem Relation Age of Onset  . Diabetes Mother   . Diabetes Father   . Heart disease Father   . Cancer Sister        unknown  . Cancer Brother        unknown  . Diabetes Brother   . Colon cancer Neg Hx     Social History   Socioeconomic History  . Marital status: Widowed    Spouse name: Not on file  . Number of children: 2  . Years of education: Not on file  . Highest education level: Not on file  Occupational History  . Not on file  Social Needs  . Financial resource strain: Not on file  . Food insecurity:    Worry: Not on file    Inability: Not on file  . Transportation needs:    Medical: Not on file    Non-medical: Not on file  Tobacco Use  . Smoking status: Former Smoker    Packs/day: 2.00    Years: 40.00    Pack years: 80.00    Types: Cigarettes    Last attempt to quit: 01/13/1998    Years since quitting: 20.5  . Smokeless tobacco: Never Used  Substance and Sexual Activity  . Alcohol use: No    Alcohol/week: 0.0 standard drinks  . Drug use: No  . Sexual activity: Not on file  Lifestyle  . Physical activity:    Days per week: Not on file    Minutes per session: Not on file  . Stress: Not on file  Relationships  . Social connections:    Talks on phone: Not on file    Gets together: Not on file    Attends religious service: Not on file    Active member of club or organization: Not on file    Attends meetings of clubs or organizations: Not on file    Relationship status: Not on file  . Intimate partner violence:    Fear of current or ex partner: Not on file    Emotionally abused: Not on file    Physically abused: Not on file    Forced sexual activity: Not on file  Other  Topics Concern  . Not on file  Social History Narrative  . Not on file    Review of Systems: See HPI, otherwise negative ROS  Physical Exam: BP (!) 115/57   Pulse 97   Temp 98.4 F (36.9 C) (Oral)   Resp 18   Ht 5\' 4"  (1.626 m)   Wt 69.4 kg   SpO2 100%   BMI 26.26 kg/m  General:   Alert,  somewhat frail, chronically ill-appearing pleasant and cooperative in NAD.  Accompanied by multiple family members. Neck:  Supple; no masses or thyromegaly. No significant cervical adenopathy. Lungs:  Clear throughout to auscultation.   No wheezes, crackles, or rhonchi. No acute distress. Heart: Irregular rhythm no murmurs, clicks, rubs,  or gallops. Abdomen: Non-distended, normal bowel sounds.  Soft and nontender without appreciable mass or hepatosplenomegaly.  Pulses:  Normal pulses noted. Extremities:  Without clubbing or edema.  Impression/Plan: Pleasant frail 84 year old lady with multiple comorbidities on chronic anticoagulation therapy admitted with profound anemia and occult blood positive stool.  By history, she has had intermittent dark stools.  Otherwise, her only GI symptoms endorsed are vague dyspepsia/reflux symptoms. History of occult GI bleeding of obscure origin.  Differential for bleeding is broad in the setting of concomitant anticoagulation therapy. Given her vague upper GI symptoms, would favor a look there initially.  Had a lengthy discussion about patient's wishes for degree of invasive GI evaluation.  I would offer this patient a diagnostic EGD with possible small bowel capsule deployment at the same time depending on the EGD findings.  I feel this could accomplished with relatively minimal risk enlisting the help of anesthesia for sedation. She is willing to undergo an EGD with possible capsule placement. Would be best practice to wait until October 14 to allow for the Eliquis to wash out. The risks, benefits, limitations, alternatives and imponderables have been reviewed  with the patient. Potential for esophageal dilation, biopsy, etc. have also been reviewed.  Questions have been answered. All parties agreeable.  I have discussed my findings and recommendations with Dr. Rito Ehrlich.  Further recommendations to follow.     Notice: This dictation was prepared with Dragon dictation along with smaller phrase technology. Any transcriptional errors that result from this process are unintentional and may not be corrected upon review.

## 2018-07-31 NOTE — ED Notes (Signed)
RN called MD Robb Matar to inform 2nd IV Site Infiltrated and Pt was no longer receiving IV Protonix. MD confirmed Blood transfusion more important and to keep Blood infusing until Protonix can be re-established.

## 2018-07-31 NOTE — Progress Notes (Signed)
PROGRESS NOTE  [name] ZOX:[id number] DOB: 08-Jul-1931 DOA: 07/30/2018 PCP: Benita Stabile, MD  HPI/Recap of past 90 hours: 84 year old female with past medical history of paroxysmal atrial fibrillation on Eliquis as well as COPD with chronic respiratory failure on oxygen and diabetes mellitus presented to the emergency room with 2 weeks of progressively worsening dyspnea and decreased pallor and found to have a hemoglobin of 6.7.  Transfused 2 units packed red blood cells.  She has not really noted a change in her stools and says that they have been dark for some time.  Admitted to the hospitalist service.  BNP at that time also noted to be elevated at 1020.  Today, doing better.  Hemoglobin up to 9.1.  Creatinine down from 1.25 to 1.11.  Her main complaint is of being hungry.  Seen by GI who is considering endoscopy with small bowel capsule deployment.  Patient has had a previous history of occult GI bleeding of obscure etiology and has undergone EGD, colonoscopy and small bowel capsule revealing only left-sided diverticulosis and small bowel AVMs.  Assessment/Plan: Principal Problem:   UGIB (upper gastrointestinal bleed): Seen by GI with plans for potential capsule endoscopy versus conservative measures only. Active Problems:   Type 2 diabetes mellitus (HCC): Continue sliding scale.  Monitor blood sugars   Essential hypertension, benign   Coronary atherosclerosis of native coronary artery   COPD (chronic obstructive pulmonary disease) (HCC) with chronic respiratory failure with hypoxia: Continue home oxygen, at baseline   Hyperlipidemia Paroxysmal atrial fibrillation (HCC): Rate controlled.  Patient does need to be on Eliquis with a chads 2 score of 5.   Anemia Chronic systolic heart failure: Ejection fraction of 45% noted last month.  Code Status: Full code  Family Communication: Family at the bedside  Disposition Plan: Potential discharge in a few days if hemoglobin remains  stable and pending on   Consultants:  GI  Procedures:  EGD with small bowel capsule deployment planned for Monday  Antimicrobials:  None  DVT prophylaxis: SCDs   Objective: Vitals:   07/31/18 0630 07/31/18 1350  BP: (!) 115/57 115/68  Pulse: 97 81  Resp: 18 18  Temp: 98.4 F (36.9 C) 98.2 F (36.8 C)  SpO2: 100% 100%    Intake/Output Summary (Last 24 hours) at 07/31/2018 1449 Last data filed at 07/31/2018 1416 Gross per 24 hour  Intake 829.48 ml  Output -  Net 829.48 ml   Filed Weights   07/30/18 1711  Weight: 69.4 kg   Body mass index is 26.26 kg/m.  Exam:   General: Alert and oriented x2, no acute distress  HEENT: Normal cephalic and atraumatic, mucous memories are slightly dry  Neck: Supple, no JVD  Cardiovascular: Irregular rhythm, rate controlled  Respiratory: Decreased breath sounds throughout  Abdomen: Soft, nontender, nondistended, hypoactive bowel sounds  Musculoskeletal: No clubbing or cyanosis, trace pitting edema  Skin: Fragile skin, but no skin breaks, tears or lesions  Psychiatry: Appropriate, no evidence of psychoses  Neuro: No focal deficits   Data Reviewed: CBC: Recent Labs  Lab 07/30/18 1839 07/31/18 0858  WBC 6.1 7.1  NEUTROABS 4.0 4.6  HGB 6.2* 9.1*  HCT 23.5* 32.2*  MCV 83.6 84.7  PLT 282 271   Basic Metabolic Panel: Recent Labs  Lab 07/30/18 1839 07/30/18 1950 07/31/18 0858  NA 136  --  139  K 4.1  --  3.8  CL 97*  --  100  CO2 30  --  29  GLUCOSE  108*  --  128*  BUN 31*  --  26*  CREATININE 1.25*  --  1.11*  CALCIUM 8.7*  --  8.7*  MG  --  2.2  --    GFR: Estimated Creatinine Clearance: 35.4 mL/min (A) (by C-G formula based on SCr of 1.11 mg/dL (H)). Liver Function Tests: Recent Labs  Lab 07/30/18 1839  AST 17  ALT 20  ALKPHOS 36*  BILITOT 0.8  PROT 5.9*  ALBUMIN 3.6   No results for input(s): LIPASE, AMYLASE in the last 168 hours. No results for input(s): AMMONIA in the last 168  hours. Coagulation Profile: No results for input(s): INR, PROTIME in the last 168 hours. Cardiac Enzymes: Recent Labs  Lab 07/30/18 1839  TROPONINI <0.03   BNP (last 3 results) No results for input(s): PROBNP in the last 8760 hours. HbA1C: No results for input(s): HGBA1C in the last 72 hours. CBG: Recent Labs  Lab 07/31/18 0739  GLUCAP 114*   Lipid Profile: No results for input(s): CHOL, HDL, LDLCALC, TRIG, CHOLHDL, LDLDIRECT in the last 72 hours. Thyroid Function Tests: No results for input(s): TSH, T4TOTAL, FREET4, T3FREE, THYROIDAB in the last 72 hours. Anemia Panel: No results for input(s): VITAMINB12, FOLATE, FERRITIN, TIBC, IRON, RETICCTPCT in the last 72 hours. Urine analysis:    Component Value Date/Time   COLORURINE YELLOW 05/23/2018 1250   APPEARANCEUR HAZY (A) 05/23/2018 1250   LABSPEC 1.018 05/23/2018 1250   PHURINE 5.0 05/23/2018 1250   GLUCOSEU NEGATIVE 05/23/2018 1250   HGBUR NEGATIVE 05/23/2018 1250   BILIRUBINUR NEGATIVE 05/23/2018 1250   KETONESUR 5 (A) 05/23/2018 1250   PROTEINUR 30 (A) 05/23/2018 1250   NITRITE NEGATIVE 05/23/2018 1250   LEUKOCYTESUR NEGATIVE 05/23/2018 1250   Sepsis Labs: @LABRCNTIP (procalcitonin:4,lacticidven:4)  )No results found for this or any previous visit (from the past 240 hour(s)).    Studies: Dg Chest 2 View  Result Date: 07/30/2018 CLINICAL DATA:  Shortness of breath for 2 weeks EXAM: CHEST - 2 VIEW COMPARISON:  06/17/2018 FINDINGS: Small bilateral pleural effusions, left greater than right. Bilateral diffuse mild interstitial thickening. No pneumothorax. No focal consolidation. Stable cardiomegaly. Dual lead cardiac pacemaker. No acute osseous abnormality. IMPRESSION: 1. Findings concerning for mild CHF. Electronically Signed   By: Elige Ko   On: 07/30/2018 16:38    Scheduled Meds: . metoprolol tartrate  2.5 mg Intravenous Q6H  . [START ON 08/03/2018] pantoprazole  40 mg Intravenous Q12H    Continuous  Infusions: . pantoprozole (PROTONIX) infusion Stopped (07/31/18 1347)     LOS: 1 day     Hollice Espy, MD [facility]  To reach me or the doctor on call, go to: [email or web address] Password [email or web address]  07/31/2018, 2:49 PM

## 2018-08-01 LAB — TYPE AND SCREEN
ABO/RH(D): B NEG
ANTIBODY SCREEN: NEGATIVE
UNIT DIVISION: 0
Unit division: 0

## 2018-08-01 LAB — CBC WITH DIFFERENTIAL/PLATELET
ABS IMMATURE GRANULOCYTES: 0.02 10*3/uL (ref 0.00–0.07)
BASOS ABS: 0 10*3/uL (ref 0.0–0.1)
Basophils Relative: 0 %
EOS ABS: 0.1 10*3/uL (ref 0.0–0.5)
Eosinophils Relative: 1 %
HEMATOCRIT: 29.8 % — AB (ref 36.0–46.0)
Hemoglobin: 8.4 g/dL — ABNORMAL LOW (ref 12.0–15.0)
IMMATURE GRANULOCYTES: 0 %
LYMPHS ABS: 1.2 10*3/uL (ref 0.7–4.0)
LYMPHS PCT: 21 %
MCH: 24.6 pg — ABNORMAL LOW (ref 26.0–34.0)
MCHC: 28.2 g/dL — ABNORMAL LOW (ref 30.0–36.0)
MCV: 87.1 fL (ref 80.0–100.0)
Monocytes Absolute: 0.8 10*3/uL (ref 0.1–1.0)
Monocytes Relative: 14 %
NEUTROS ABS: 3.9 10*3/uL (ref 1.7–7.7)
NEUTROS PCT: 64 %
NRBC: 0.3 % — AB (ref 0.0–0.2)
Platelets: 215 10*3/uL (ref 150–400)
RBC: 3.42 MIL/uL — ABNORMAL LOW (ref 3.87–5.11)
RDW: 16.4 % — AB (ref 11.5–15.5)
WBC: 6.1 10*3/uL (ref 4.0–10.5)

## 2018-08-01 LAB — BPAM RBC
Blood Product Expiration Date: 201910232359
Blood Product Expiration Date: 201910232359
ISSUE DATE / TIME: 201910112226
ISSUE DATE / TIME: 201910120245
Unit Type and Rh: 9500
Unit Type and Rh: 9500

## 2018-08-01 LAB — GLUCOSE, CAPILLARY
GLUCOSE-CAPILLARY: 102 mg/dL — AB (ref 70–99)
Glucose-Capillary: 119 mg/dL — ABNORMAL HIGH (ref 70–99)
Glucose-Capillary: 127 mg/dL — ABNORMAL HIGH (ref 70–99)
Glucose-Capillary: 107 mg/dL — ABNORMAL HIGH (ref 70–99)

## 2018-08-01 LAB — BASIC METABOLIC PANEL
ANION GAP: 6 (ref 5–15)
BUN: 20 mg/dL (ref 8–23)
CALCIUM: 8.7 mg/dL — AB (ref 8.9–10.3)
CO2: 30 mmol/L (ref 22–32)
Chloride: 103 mmol/L (ref 98–111)
Creatinine, Ser: 0.92 mg/dL (ref 0.44–1.00)
GFR calc non Af Amer: 55 mL/min — ABNORMAL LOW (ref >60)
Glucose, Bld: 111 mg/dL — ABNORMAL HIGH (ref 70–99)
Potassium: 3.6 mmol/L (ref 3.5–5.1)
Sodium: 139 mmol/L (ref 135–145)

## 2018-08-01 MED ORDER — FUROSEMIDE 10 MG/ML IJ SOLN
20.0000 mg | Freq: Two times a day (BID) | INTRAMUSCULAR | Status: DC
  Administered 2018-08-01 – 2018-08-03 (×5): 20 mg via INTRAVENOUS
  Filled 2018-08-01 (×5): qty 2

## 2018-08-01 MED ORDER — SODIUM CHLORIDE 0.9 % IV SOLN
INTRAVENOUS | Status: DC
  Administered 2018-08-02: 15:00:00 via INTRAVENOUS

## 2018-08-01 MED ORDER — SODIUM CHLORIDE 0.9 % IV SOLN
INTRAVENOUS | Status: DC
  Administered 2018-08-01: 20:00:00 via INTRAVENOUS

## 2018-08-01 NOTE — Progress Notes (Signed)
PROGRESS NOTE  [name] UJW:[id number] DOB: 1931-05-26 DOA: 07/30/2018 PCP: Benita Stabile, MD  HPI/Recap of past 67 hours: 84 year old female with past medical history of paroxysmal atrial fibrillation on Eliquis as well as COPD with chronic respiratory failure on oxygen and diabetes mellitus presented to the emergency room with 2 weeks of progressively worsening dyspnea and decreased pallor and found to have a hemoglobin of 6.7.  Transfused 2 units packed red blood cells.  She has not really noted a change in her stools and says that they have been dark for some time.  Admitted to the hospitalist service.  BNP at that time also noted to be elevated at 1020.  Today, doing better.  Hemoglobin up to 9.1, down to 8.4 this morning.  That said, creatinine now normalized so this may be more from hemoconcentration..  Plan for endoscopy with small bowel capsule deployment tomorrow.  Patient has had a previous history of occult GI bleeding of obscure etiology and has undergone EGD, colonoscopy and small bowel capsule revealing only left-sided diverticulosis and small bowel AVMs.  Patient complains of feeling tired, a little anxious about endoscopy  Assessment/Plan: Principal Problem:   UGIB (upper gastrointestinal bleed): Seen by GI with plans for potential capsule endoscopy versus conservative measures only. Active Problems:   Type 2 diabetes mellitus (HCC): Continue sliding scale.  Monitor blood sugars   Essential hypertension, benign   Coronary atherosclerosis of native coronary artery   COPD (chronic obstructive pulmonary disease) (HCC) with chronic respiratory failure with hypoxia: Continue home oxygen, at baseline   Hyperlipidemia Paroxysmal atrial fibrillation (HCC): Rate controlled.  Patient does need to be on Eliquis with a chads 2 score of 5.   Anemia Chronic systolic heart failure: Ejection fraction of 45% noted last month.  Code Status: Full code  Family Communication: Family  at the bedside  Disposition Plan: Potential discharge in next 1 to 2 days if hemoglobin remains stable   Consultants:  GI  Procedures:  EGD with small bowel capsule deployment planned for Monday  Antimicrobials:  None  DVT prophylaxis: SCDs   Objective: Vitals:   08/01/18 1437 08/01/18 1530  BP:  116/66  Pulse:  (!) 110  Resp:  20  Temp:  98.4 F (36.9 C)  SpO2: 98% 100%    Intake/Output Summary (Last 24 hours) at 08/01/2018 1551 Last data filed at 08/01/2018 1300 Gross per 24 hour  Intake 967.61 ml  Output 350 ml  Net 617.61 ml   Filed Weights   07/30/18 1711  Weight: 69.4 kg   Body mass index is 26.26 kg/m.  Exam:   General: Alert and oriented x2, no acute distress  HEENT: Normal cephalic and atraumatic, mucous memories are slightly dry  Neck: Supple, no JVD  Cardiovascular: Irregular rhythm, rate controlled  Respiratory: Decreased breath sounds throughout  Abdomen: Soft, nontender, nondistended, hypoactive bowel sounds  Musculoskeletal: No clubbing or cyanosis, trace pitting edema  Skin: Fragile skin, but no skin breaks, tears or lesions  Psychiatry: Appropriate, no evidence of psychoses  Neuro: No focal deficits   Data Reviewed: CBC: Recent Labs  Lab 07/30/18 1839 07/31/18 0858 08/01/18 0520  WBC 6.1 7.1 6.1  NEUTROABS 4.0 4.6 3.9  HGB 6.2* 9.1* 8.4*  HCT 23.5* 32.2* 29.8*  MCV 83.6 84.7 87.1  PLT 282 271 215   Basic Metabolic Panel: Recent Labs  Lab 07/30/18 1839 07/30/18 1950 07/31/18 0858 08/01/18 0520  NA 136  --  139 139  K 4.1  --  3.8 3.6  CL 97*  --  100 103  CO2 30  --  29 30  GLUCOSE 108*  --  128* 111*  BUN 31*  --  26* 20  CREATININE 1.25*  --  1.11* 0.92  CALCIUM 8.7*  --  8.7* 8.7*  MG  --  2.2  --   --    GFR: Estimated Creatinine Clearance: 42.8 mL/min (by C-G formula based on SCr of 0.92 mg/dL). Liver Function Tests: Recent Labs  Lab 07/30/18 1839  AST 17  ALT 20  ALKPHOS 36*  BILITOT  0.8  PROT 5.9*  ALBUMIN 3.6   No results for input(s): LIPASE, AMYLASE in the last 168 hours. No results for input(s): AMMONIA in the last 168 hours. Coagulation Profile: No results for input(s): INR, PROTIME in the last 168 hours. Cardiac Enzymes: Recent Labs  Lab 07/30/18 1839  TROPONINI <0.03   BNP (last 3 results) No results for input(s): PROBNP in the last 8760 hours. HbA1C: No results for input(s): HGBA1C in the last 72 hours. CBG: Recent Labs  Lab 07/31/18 1627 07/31/18 2025 07/31/18 2316 08/01/18 0445 08/01/18 1109  GLUCAP 173* 146* 131* 119* 127*   Lipid Profile: No results for input(s): CHOL, HDL, LDLCALC, TRIG, CHOLHDL, LDLDIRECT in the last 72 hours. Thyroid Function Tests: No results for input(s): TSH, T4TOTAL, FREET4, T3FREE, THYROIDAB in the last 72 hours. Anemia Panel: No results for input(s): VITAMINB12, FOLATE, FERRITIN, TIBC, IRON, RETICCTPCT in the last 72 hours. Urine analysis:    Component Value Date/Time   COLORURINE YELLOW 05/23/2018 1250   APPEARANCEUR HAZY (A) 05/23/2018 1250   LABSPEC 1.018 05/23/2018 1250   PHURINE 5.0 05/23/2018 1250   GLUCOSEU NEGATIVE 05/23/2018 1250   HGBUR NEGATIVE 05/23/2018 1250   BILIRUBINUR NEGATIVE 05/23/2018 1250   KETONESUR 5 (A) 05/23/2018 1250   PROTEINUR 30 (A) 05/23/2018 1250   NITRITE NEGATIVE 05/23/2018 1250   LEUKOCYTESUR NEGATIVE 05/23/2018 1250   Sepsis Labs: @LABRCNTIP (procalcitonin:4,lacticidven:4)  )No results found for this or any previous visit (from the past 240 hour(s)).    Studies: No results found.  Scheduled Meds: . furosemide  20 mg Intravenous Q12H  . ipratropium  0.5 mg Nebulization TID  . levalbuterol  1.25 mg Nebulization TID  . metoprolol tartrate  2.5 mg Intravenous Q6H  . [START ON 08/03/2018] pantoprazole  40 mg Intravenous Q12H    Continuous Infusions: . sodium chloride    . sodium chloride    . pantoprozole (PROTONIX) infusion 8 mg/hr (08/01/18 1041)     LOS:  2 days     Hollice Espy, MD [facility]  To reach me or the doctor on call, go to: [email or web address] Password [email or web address]  08/01/2018, 3:51 PM

## 2018-08-02 ENCOUNTER — Encounter (HOSPITAL_COMMUNITY)
Admission: EM | Disposition: A | Discharge status: Home Health | Payer: Self-pay | Source: Home / Self Care | Attending: Internal Medicine

## 2018-08-02 ENCOUNTER — Inpatient Hospital Stay (HOSPITAL_COMMUNITY): Payer: Medicare Other | Admitting: Anesthesiology

## 2018-08-02 ENCOUNTER — Other Ambulatory Visit: Payer: Self-pay

## 2018-08-02 ENCOUNTER — Encounter (HOSPITAL_COMMUNITY): Payer: Self-pay | Admitting: *Deleted

## 2018-08-02 DIAGNOSIS — K3189 Other diseases of stomach and duodenum: Secondary | ICD-10-CM | POA: Diagnosis not present

## 2018-08-02 DIAGNOSIS — K222 Esophageal obstruction: Secondary | ICD-10-CM | POA: Diagnosis not present

## 2018-08-02 DIAGNOSIS — K921 Melena: Secondary | ICD-10-CM | POA: Diagnosis not present

## 2018-08-02 DIAGNOSIS — I10 Essential (primary) hypertension: Secondary | ICD-10-CM | POA: Diagnosis not present

## 2018-08-02 HISTORY — PX: ESOPHAGOGASTRODUODENOSCOPY (EGD) WITH PROPOFOL: SHX5813

## 2018-08-02 HISTORY — PX: BIOPSY: SHX5522

## 2018-08-02 LAB — BASIC METABOLIC PANEL
Anion gap: 5 (ref 5–15)
BUN: 15 mg/dL (ref 8–23)
CALCIUM: 8.7 mg/dL — AB (ref 8.9–10.3)
CO2: 32 mmol/L (ref 22–32)
CREATININE: 0.92 mg/dL (ref 0.44–1.00)
Chloride: 103 mmol/L (ref 98–111)
GFR calc Af Amer: >60 mL/min (ref >60)
GFR, EST NON AFRICAN AMERICAN: 55 mL/min — AB (ref >60)
GLUCOSE: 107 mg/dL — AB (ref 70–99)
Potassium: 4.1 mmol/L (ref 3.5–5.1)
Sodium: 140 mmol/L (ref 135–145)

## 2018-08-02 LAB — CBC WITH DIFFERENTIAL/PLATELET
Abs Immature Granulocytes: 0.01 10*3/uL (ref 0.00–0.07)
BASOS ABS: 0 10*3/uL (ref 0.0–0.1)
BASOS PCT: 0 %
EOS ABS: 0.1 10*3/uL (ref 0.0–0.5)
EOS PCT: 2 %
HCT: 29.9 % — ABNORMAL LOW (ref 36.0–46.0)
Hemoglobin: 8 g/dL — ABNORMAL LOW (ref 12.0–15.0)
IMMATURE GRANULOCYTES: 0 %
Lymphocytes Relative: 22 %
Lymphs Abs: 1.3 10*3/uL (ref 0.7–4.0)
MCH: 23.3 pg — ABNORMAL LOW (ref 26.0–34.0)
MCHC: 26.8 g/dL — AB (ref 30.0–36.0)
MCV: 87.2 fL (ref 80.0–100.0)
Monocytes Absolute: 0.7 10*3/uL (ref 0.1–1.0)
Monocytes Relative: 13 %
NEUTROS PCT: 63 %
NRBC: 0 % (ref 0.0–0.2)
Neutro Abs: 3.7 10*3/uL (ref 1.7–7.7)
PLATELETS: 222 10*3/uL (ref 150–400)
RBC: 3.43 MIL/uL — ABNORMAL LOW (ref 3.87–5.11)
RDW: 17.2 % — AB (ref 11.5–15.5)
WBC: 5.9 10*3/uL (ref 4.0–10.5)

## 2018-08-02 LAB — GLUCOSE, CAPILLARY
GLUCOSE-CAPILLARY: 116 mg/dL — AB (ref 70–99)
Glucose-Capillary: 110 mg/dL — ABNORMAL HIGH (ref 70–99)
Glucose-Capillary: 113 mg/dL — ABNORMAL HIGH (ref 70–99)
Glucose-Capillary: 94 mg/dL (ref 70–99)
Glucose-Capillary: 121 mg/dL — ABNORMAL HIGH (ref 70–99)
Glucose-Capillary: 161 mg/dL — ABNORMAL HIGH (ref 70–99)

## 2018-08-02 SURGERY — ESOPHAGOGASTRODUODENOSCOPY (EGD) WITH PROPOFOL
Anesthesia: Monitor Anesthesia Care

## 2018-08-02 MED ORDER — METOPROLOL TARTRATE 5 MG/5ML IV SOLN
2.5000 mg | Freq: Four times a day (QID) | INTRAVENOUS | Status: DC | PRN
  Administered 2018-08-02: 2.5 mg via INTRAVENOUS
  Filled 2018-08-02: qty 5

## 2018-08-02 MED ORDER — PANTOPRAZOLE SODIUM 40 MG PO TBEC
40.0000 mg | DELAYED_RELEASE_TABLET | Freq: Every day | ORAL | Status: DC
  Administered 2018-08-02 – 2018-08-03 (×2): 40 mg via ORAL
  Filled 2018-08-02 (×2): qty 1

## 2018-08-02 MED ORDER — PROPOFOL 500 MG/50ML IV EMUL
INTRAVENOUS | Status: DC | PRN
  Administered 2018-08-02: 125 ug/kg/min via INTRAVENOUS

## 2018-08-02 MED ORDER — ESCITALOPRAM OXALATE 10 MG PO TABS
20.0000 mg | ORAL_TABLET | Freq: Every day | ORAL | Status: DC
  Administered 2018-08-02: 20 mg via ORAL
  Filled 2018-08-02: qty 2

## 2018-08-02 MED ORDER — BISOPROLOL FUMARATE 5 MG PO TABS
2.5000 mg | ORAL_TABLET | Freq: Every day | ORAL | Status: DC
  Administered 2018-08-02 – 2018-08-03 (×2): 2.5 mg via ORAL
  Filled 2018-08-02 (×2): qty 1

## 2018-08-02 NOTE — Progress Notes (Signed)
Patient remained stable.  Seen in short stay.  Plan for diagnostic EGD today.  Will hold off on a capsule study if at all possible given coexisting pacemaker.  She denies dysphagia.  The risks, benefits, limitations, alternatives and imponderables have been reviewed with the patient. Potential for esophageal dilation, biopsy, etc. have also been reviewed.  Questions have been answered. All parties agreeable.  Further recommendations to follow.

## 2018-08-02 NOTE — Progress Notes (Signed)
PROGRESS NOTE  Kathy HOCEVAR WUJ:[id number] DOB: February 09, 1931 DOA: 07/30/2018 PCP: Benita Stabile, MD  HPI/Recap of past 75 hours: 84 year old female with past medical history of paroxysmal atrial fibrillation on Eliquis as well as COPD with chronic respiratory failure on oxygen and diabetes mellitus presented to the emergency room with 2 weeks of progressively worsening dyspnea and decreased pallor and found to have a hemoglobin of 6.7.  Transfused 2 units packed red blood cells.  She has not really noted a change in her stools and says that they have been dark for some time.  Admitted to the hospitalist service.  BNP at that time also noted to be elevated at 1020.  Patient underwent EGD today noting some erosions, but no signs of obvious bleeding.  Biopsy sent.  Hemoglobin down slightly, but BUN and creatinine and vital signs stable.  Patient is herself much relieved post endoscopy, was secretly afraid that she might have cancer.  Feeling okay, no complaints.    Assessment/Plan: Principal Problem:   UGIB (upper gastrointestinal bleed): Seen by GI.  Status post endoscopy.  Erosions noted.  Protonix drip discontinued.  Awaiting pathology before restarting anticoagulation.  Allowed to eat. Active Problems:   Type 2 diabetes mellitus (HCC): Continue sliding scale.  Monitor blood sugars   Essential hypertension, benign   Coronary atherosclerosis of native coronary artery   COPD (chronic obstructive pulmonary disease) (HCC) with chronic respiratory failure with hypoxia: Continue home oxygen, at baseline   Hyperlipidemia Paroxysmal atrial fibrillation [facility]): Has started becoming more tachycardic.  Patient does need to be on Eliquis with a chads 2 score of 5.  Have restarted home bisoprolol and change IV metoprolol to as needed   Anemia Chronic systolic heart failure: Ejection fraction of 45% noted last month.  Code Status: Full code  Family Communication: Left message for family  Disposition  Plan: Potential discharge tomorrow if cleared by GI   Consultants:  GI  Procedures:  Status post EGD done 10/14: Erosions noted  Antimicrobials:  None  DVT prophylaxis: SCDs   Objective: Vitals:   08/02/18 1600 08/02/18 1623  BP: 93/67 127/75  Pulse: (!) 122 (!) 119  Resp: (!) 26 20  Temp:  98.4 F (36.9 C)  SpO2: 92% 99%    Intake/Output Summary (Last 24 hours) at 08/02/2018 1759 Last data filed at 08/02/2018 1751 Gross per 24 hour  Intake 1531.39 ml  Output 1100 ml  Net 431.39 ml   Filed Weights   07/30/18 1711  Weight: 69.4 kg   Body mass index is 26.26 kg/m.  Exam:   General: Alert and oriented x2, no acute distress  HEENT: Normal cephalic and atraumatic, mucous memories are slightly dry  Neck: Supple, no JVD  Cardiovascular: Irregular rhythm, borderline tachycardia  Respiratory: Decreased breath sounds throughout  Abdomen: Soft, nontender, nondistended, hypoactive bowel sounds  Musculoskeletal: No clubbing or cyanosis, trace pitting edema  Skin: Fragile skin, but no skin breaks, tears or lesions  Psychiatry: Appropriate, no evidence of psychoses  Neuro: No focal deficits   Data Reviewed: CBC: Recent Labs  Lab 07/30/18 1839 07/31/18 0858 08/01/18 0520 08/02/18 0520  WBC 6.1 7.1 6.1 5.9  NEUTROABS 4.0 4.6 3.9 3.7  HGB 6.2* 9.1* 8.4* 8.0*  HCT 23.5* 32.2* 29.8* 29.9*  MCV 83.6 84.7 87.1 87.2  PLT 282 271 215 222   Basic Metabolic Panel: Recent Labs  Lab 07/30/18 1839 07/30/18 1950 07/31/18 0858 08/01/18 0520 08/02/18 0520  NA 136  --  139 139  140  K 4.1  --  3.8 3.6 4.1  CL 97*  --  100 103 103  CO2 30  --  29 30 32  GLUCOSE 108*  --  128* 111* 107*  BUN 31*  --  26* 20 15  CREATININE 1.25*  --  1.11* 0.92 0.92  CALCIUM 8.7*  --  8.7* 8.7* 8.7*  MG  --  2.2  --   --   --    GFR: Estimated Creatinine Clearance: 42.8 mL/min (by C-G formula based on SCr of 0.92 mg/dL). Liver Function Tests: Recent Labs  Lab  07/30/18 1839  AST 17  ALT 20  ALKPHOS 36*  BILITOT 0.8  PROT 5.9*  ALBUMIN 3.6   No results for input(s): LIPASE, AMYLASE in the last 168 hours. No results for input(s): AMMONIA in the last 168 hours. Coagulation Profile: No results for input(s): INR, PROTIME in the last 168 hours. Cardiac Enzymes: Recent Labs  Lab 07/30/18 1839  TROPONINI <0.03   BNP (last 3 results) No results for input(s): PROBNP in the last 8760 hours. HbA1C: No results for input(s): HGBA1C in the last 72 hours. CBG: Recent Labs  Lab 08/02/18 0615 08/02/18 1101 08/02/18 1520 08/02/18 1603 08/02/18 1627  GLUCAP 110* 113* 94 121* 116*   Lipid Profile: No results for input(s): CHOL, HDL, LDLCALC, TRIG, CHOLHDL, LDLDIRECT in the last 72 hours. Thyroid Function Tests: No results for input(s): TSH, T4TOTAL, FREET4, T3FREE, THYROIDAB in the last 72 hours. Anemia Panel: No results for input(s): VITAMINB12, FOLATE, FERRITIN, TIBC, IRON, RETICCTPCT in the last 72 hours. Urine analysis:    Component Value Date/Time   COLORURINE YELLOW 05/23/2018 1250   APPEARANCEUR HAZY (A) 05/23/2018 1250   LABSPEC 1.018 05/23/2018 1250   PHURINE 5.0 05/23/2018 1250   GLUCOSEU NEGATIVE 05/23/2018 1250   HGBUR NEGATIVE 05/23/2018 1250   BILIRUBINUR NEGATIVE 05/23/2018 1250   KETONESUR 5 (A) 05/23/2018 1250   PROTEINUR 30 (A) 05/23/2018 1250   NITRITE NEGATIVE 05/23/2018 1250   LEUKOCYTESUR NEGATIVE 05/23/2018 1250   Sepsis Labs: @LABRCNTIP (procalcitonin:4,lacticidven:4)  )No results found for this or any previous visit (from the past 240 hour(s)).    Studies: No results found.  Scheduled Meds: . furosemide  20 mg Intravenous Q12H  . ipratropium  0.5 mg Nebulization TID  . levalbuterol  1.25 mg Nebulization TID  . metoprolol tartrate  2.5 mg Intravenous Q6H  . pantoprazole  40 mg Oral Daily    Continuous Infusions:    LOS: 3 days     Hollice Espy, MD [facility]  To reach me or the  doctor on call, go to: [email or web address] Password [email or web address]  08/02/2018, 5:59 PM

## 2018-08-02 NOTE — Anesthesia Postprocedure Evaluation (Signed)
Anesthesia Post Note  Patient: [name]  Procedure(s) Performed: ESOPHAGOGASTRODUODENOSCOPY (EGD) WITH PROPOFOL (N/A ) BIOPSY  Patient location during evaluation: PACU Anesthesia Type: MAC Level of consciousness: awake and patient cooperative Pain management: pain level controlled Vital Signs Assessment: post-procedure vital signs reviewed and stable Respiratory status: spontaneous breathing, nonlabored ventilation and respiratory function stable Postop Assessment: no apparent nausea or vomiting Anesthetic complications: no     Last Vitals:  Vitals:   08/02/18 1509 08/02/18 1510  BP:    Pulse:    Resp: (!) 26 (!) 26  Temp:    SpO2: (!) 88% (!) 89%    Last Pain:  Vitals:   08/02/18 1525  TempSrc:   PainSc: 0-No pain                 Natara Monfort J

## 2018-08-02 NOTE — Care Management Important Message (Signed)
Important Message  Patient Details  Name: [name] MRN: [id number] Date of Birth: 1931/08/08   Medicare Important Message Given:  Yes    Renie Ora 08/02/2018, 10:24 AM

## 2018-08-02 NOTE — Op Note (Signed)
[facility] Patient Name: [name] MRN: [id number] Date of Birth: Jul 21, 1931 Attending MD: Gennette Pac , MD CSN: [id number] Age: 84 Admit Type: Outpatient Procedure:                Upper GI endoscopy Indications:              Melena Providers:                Gennette Pac, MD, Criselda Peaches. Patsy Lager, RN,                            Edythe Clarity, Technician Referring MD:              Medicines:                Propofol per Anesthesia Complications:            No immediate complications. Estimated Blood Loss:     Estimated blood loss was minimal. Procedure:                Pre-Anesthesia Assessment:                           - Prior to the procedure, a History and Physical                            was performed, and patient medications and                            allergies were reviewed. The patient's tolerance of                            previous anesthesia was also reviewed. The risks                            and benefits of the procedure and the sedation                            options and risks were discussed with the patient.                            All questions were answered, and informed consent                            was obtained. Prior Anticoagulants: The patient has                            taken no previous anticoagulant or antiplatelet                            agents. ASA Grade Assessment: II - A patient with                            mild systemic disease. After reviewing the risks  and benefits, the patient was deemed in                            satisfactory condition to undergo the procedure.                           After obtaining informed consent, the endoscope was                            passed under direct vision. Throughout the                            procedure, the patient's blood pressure, pulse, and                            oxygen saturations were  monitored continuously. The                            [id number]-[id number] ([id number]) was introduced through the and                            advanced to the second part of duodenum. The upper                            GI endoscopy was accomplished without difficulty.                            The patient tolerated the procedure well. Scope In: 3:33:45 Page Scope Out: 3:41:09 Page Total Procedure Duration: 0 hours 7 minutes 24 seconds  Findings:      A mild Schatzki ring was found at the gastroesophageal junction.      A few localized erosions were found in the gastric antrum. Single       yellowish nodule consistent with lymphangiectasia also found in the       antrum. Appeared innocent. No ulcer infiltrating process observed.      The duodenal bulb and second portion of the duodenum were normal.       Abnormal gastric mucosa biopsied with a cold forceps for histology.       Estimated blood loss was minimal. Impression:               - Mild Schatzki ring.                           - Erosive gastropathy. Abnormal stomach of doubtful                            clinical significance biopsied.                           - Normal duodenal bulb and second portion of the                            duodenum. Moderate Sedation:      Moderate (conscious) sedation was personally administered by an       anesthesia professional.  The following parameters were monitored: oxygen       saturation, heart rate, blood pressure, respiratory rate, EKG, adequacy       of pulmonary ventilation, and response to care. Recommendation:           - Patient has a contact number available for                            emergencies. The signs and symptoms of potential                            delayed complications were discussed with the                            patient. Return to normal activities tomorrow.                            Written discharge instructions were provided to the                            patient.                            - Advance diet as tolerated.                           - Return patient to hospital ward for ongoing care.                           - Cardiac diet.                           - Continue present medications. Hold off on                            resuming Eliquis until path returns. If H. pylori,                            will treat. If not, would reassess need for chronic                            anticoagulation therapy. Patient and family not                            interested in further invasive GI evaluation.                           - Await pathology results.                           - No repeat upper endoscopy.                           - Return to GI office (date not yet determined). I  have discussed my findings and recommendations with                            multiple family members in short stay. Procedure Code(s):        --- Professional ---                           [phone], Esophagogastroduodenoscopy, flexible,                            transoral; with biopsy, single or multiple Diagnosis Code(s):        --- Professional ---                           K22.2, Esophageal obstruction                           K31.89, Other diseases of stomach and duodenum                           K92.1, Melena (includes Hematochezia) CPT copyright 2018 American Medical Association. All rights reserved. The codes documented in this report are preliminary and upon coder review may  be revised to meet current compliance requirements. Gerrit Friends. Rourk, MD Gennette Pac, MD 08/02/2018 4:33:16 Page This report has been signed electronically. Number of Addenda: 0

## 2018-08-02 NOTE — Transfer of Care (Signed)
Immediate Anesthesia Transfer of Care Note  Patient: Dory Peru  Procedure(s) Performed: ESOPHAGOGASTRODUODENOSCOPY (EGD) WITH PROPOFOL (N/A ) BIOPSY  Patient Location: PACU  Anesthesia Type:MAC  Level of Consciousness: awake and patient cooperative  Airway & Oxygen Therapy: Patient Spontanous Breathing and Patient connected to nasal cannula oxygen  Post-op Assessment: Report given to RN and Post -op Vital signs reviewed and stable  Post vital signs: Reviewed and stable  Last Vitals:  Vitals Value Taken Time  BP    Temp    Pulse 62 08/02/2018  3:48 PM  Resp    SpO2 82 % 08/02/2018  3:48 PM  Vitals shown include unvalidated device data.  Last Pain:  Vitals:   08/02/18 1525  TempSrc:   PainSc: 0-No pain      Patients Stated Pain Goal: 6 (39/76/73 [id number])  Complications: No apparent anesthesia complications

## 2018-08-02 NOTE — Anesthesia Preprocedure Evaluation (Signed)
Anesthesia Evaluation  Patient identified by MRN, date of birth, ID band Patient awake    Reviewed: Allergy & Precautions, H&P , NPO status , Patient's Chart, lab work & pertinent test results, reviewed documented beta blocker date and time   Airway Mallampati: II  TM Distance: >3 FB Neck ROM: full    Dental no notable dental hx. (+) Edentulous Upper, Edentulous Lower   Pulmonary shortness of breath, asthma , pneumonia, COPD,  oxygen dependent, former smoker,    Pulmonary exam normal breath sounds clear to auscultation       Cardiovascular Exercise Tolerance: Good hypertension, + CAD, + Past MI and +CHF   Rhythm:regular Rate:Normal     Neuro/Psych negative neurological ROS  negative psych ROS   GI/Hepatic negative GI ROS, Neg liver ROS,   Endo/Other  negative endocrine ROSdiabetes  Renal/GU negative Renal ROS  negative genitourinary   Musculoskeletal   Abdominal   Peds  Hematology  (+) Blood dyscrasia, anemia ,   Anesthesia Other Findings   Reproductive/Obstetrics negative OB ROS                             Anesthesia Physical Anesthesia Plan  ASA: III  Anesthesia Plan: MAC   Post-op Pain Management:    Induction:   PONV Risk Score and Plan:   Airway Management Planned:   Additional Equipment:   Intra-op Plan:   Post-operative Plan:   Informed Consent: I have reviewed the patients History and Physical, chart, labs and discussed the procedure including the risks, benefits and alternatives for the proposed anesthesia with the patient or authorized representative who has indicated his/her understanding and acceptance.     Plan Discussed with: CRNA  Anesthesia Plan Comments:         Anesthesia Quick Evaluation

## 2018-08-02 NOTE — Progress Notes (Signed)
Pt's heart rate intermittently maintaining 130's-140's on telemetry. Pt asymptomatic. Dr. Rito Ehrlich made aware. New orders received.

## 2018-08-02 NOTE — Progress Notes (Signed)
Reviewed risks and benefits of EGD with Propofol today with patient. Potassium normal at 4.1. Hgb 8.0 this morning. No overt GI bleeding. As of note, pacemaker placed in 2017. Stable for EGD today.

## 2018-08-03 ENCOUNTER — Telehealth: Payer: Self-pay | Admitting: Gastroenterology

## 2018-08-03 DIAGNOSIS — J9612 Chronic respiratory failure with hypercapnia: Secondary | ICD-10-CM

## 2018-08-03 DIAGNOSIS — I5022 Chronic systolic (congestive) heart failure: Secondary | ICD-10-CM

## 2018-08-03 DIAGNOSIS — K922 Gastrointestinal hemorrhage, unspecified: Secondary | ICD-10-CM

## 2018-08-03 DIAGNOSIS — E1159 Type 2 diabetes mellitus with other circulatory complications: Secondary | ICD-10-CM

## 2018-08-03 DIAGNOSIS — J9611 Chronic respiratory failure with hypoxia: Secondary | ICD-10-CM

## 2018-08-03 DIAGNOSIS — J438 Other emphysema: Secondary | ICD-10-CM

## 2018-08-03 DIAGNOSIS — D649 Anemia, unspecified: Secondary | ICD-10-CM

## 2018-08-03 DIAGNOSIS — I480 Paroxysmal atrial fibrillation: Secondary | ICD-10-CM

## 2018-08-03 LAB — BASIC METABOLIC PANEL
ANION GAP: 6 (ref 5–15)
BUN: 16 mg/dL (ref 8–23)
CHLORIDE: 101 mmol/L (ref 98–111)
CO2: 33 mmol/L — AB (ref 22–32)
CREATININE: 0.96 mg/dL (ref 0.44–1.00)
Calcium: 8.6 mg/dL — ABNORMAL LOW (ref 8.9–10.3)
GFR calc non Af Amer: 52 mL/min — ABNORMAL LOW (ref >60)
Glucose, Bld: 127 mg/dL — ABNORMAL HIGH (ref 70–99)
Potassium: 3.6 mmol/L (ref 3.5–5.1)
Sodium: 140 mmol/L (ref 135–145)

## 2018-08-03 LAB — CBC WITH DIFFERENTIAL/PLATELET
Abs Immature Granulocytes: 0.02 10*3/uL (ref 0.00–0.07)
BASOS ABS: 0 10*3/uL (ref 0.0–0.1)
Basophils Relative: 0 %
EOS ABS: 0.1 10*3/uL (ref 0.0–0.5)
EOS PCT: 2 %
HEMATOCRIT: 31.1 % — AB (ref 36.0–46.0)
HEMOGLOBIN: 8.5 g/dL — AB (ref 12.0–15.0)
Immature Granulocytes: 0 %
LYMPHS ABS: 1.3 10*3/uL (ref 0.7–4.0)
LYMPHS PCT: 20 %
MCH: 24.4 pg — ABNORMAL LOW (ref 26.0–34.0)
MCHC: 27.3 g/dL — AB (ref 30.0–36.0)
MCV: 89.1 fL (ref 80.0–100.0)
MONOS PCT: 14 %
Monocytes Absolute: 0.9 10*3/uL (ref 0.1–1.0)
NRBC: 0 % (ref 0.0–0.2)
Neutro Abs: 4.3 10*3/uL (ref 1.7–7.7)
Neutrophils Relative %: 64 %
Platelets: 224 10*3/uL (ref 150–400)
RBC: 3.49 MIL/uL — ABNORMAL LOW (ref 3.87–5.11)
RDW: 17.9 % — ABNORMAL HIGH (ref 11.5–15.5)
WBC: 6.7 10*3/uL (ref 4.0–10.5)

## 2018-08-03 LAB — GLUCOSE, CAPILLARY
Glucose-Capillary: 122 mg/dL — ABNORMAL HIGH (ref 70–99)
Glucose-Capillary: 213 mg/dL — ABNORMAL HIGH (ref 70–99)

## 2018-08-03 NOTE — Telephone Encounter (Signed)
Please arrange outpatient hospital follow-up in 2 months. Needs repeat CBC next Monday as outpatient.

## 2018-08-03 NOTE — Progress Notes (Signed)
    Subjective: No overt GI bleeding. No N/V. Tolerating diet. No abdominal pain. Wants to go home.   Objective: Vital signs in last 24 hours: Temp:  [97.8 F (36.6 C)-98.4 F (36.9 C)] 98.2 F (36.8 C) (10/15 0544) Pulse Rate:  [97-122] 100 (10/15 0544) Resp:  [18-33] 18 (10/15 0544) BP: (93-134)/(59-75) 106/60 (10/15 0544) SpO2:  [83 %-100 %] 98 % (10/15 0745) Last BM Date: 08/01/18 General:   Alert and oriented, pleasant Abdomen:  Bowel sounds present, soft, non-tender, non-distended. No HSM or hernias noted. No rebound or guarding.  Neurologic:  Alert and  oriented x4 Psych:  Alert and cooperative. Normal mood and affect.  Intake/Output from previous day: 10/14 0701 - 10/15 0700 In: 1002.7 [P.O.:360; I.V.:642.7] Out: 1700 [Urine:1700] Intake/Output this shift: No intake/output data recorded.  Lab Results: Recent Labs    08/01/18 0520 08/02/18 0520 08/03/18 0414  WBC 6.1 5.9 6.7  HGB 8.4* 8.0* 8.5*  HCT 29.8* 29.9* 31.1*  PLT 215 222 224   BMET Recent Labs    08/01/18 0520 08/02/18 0520 08/03/18 0414  NA 139 140 140  K 3.6 4.1 3.6  CL 103 103 101  CO2 30 32 33*  GLUCOSE 111* 107* 127*  BUN 20 15 16   CREATININE 0.92 0.92 0.96  CALCIUM 8.7* 8.7* 8.6*    Assessment:  84 year old female admitted with profound anemia, heme positive stool on Eliquis. Received 2 units PRBCs this admission. EGD 10/14 with mild Schatzki ring, erosive gastropathy s/p biopsy, normal duodenum. Hgb stable without overt GI bleeding. From a GI standpoint, she is stable and will sign off. Recommend holding Eliquis until pathology returns. If positive H.pylori, will treat appropriately; otherwise, risks and benefits of continued anticoagulation will need to be assessed.     Plan: Continue to hold Eliquis PPI daily Will arrange outpatient follow-up Signing off.  Gelene Mink, PhD, ANP-BC [facility]      LOS: 4 days    08/03/2018, 8:09 AM

## 2018-08-03 NOTE — Discharge Instructions (Signed)
Please hold the eliquis (apixaban) until your pathology results are back and your are told that it is safe to restart taking it. Follow up with your primary care provider next week to get your final pathology results and discuss restarting eliquis.  Please have your cbc tested next week as arranged by GI. Please follow up with GI in 2 months as scheduled.  Seek medical care or return to ER if symptoms come back, worsen or new problems develop.   Gastrointestinal Bleeding Gastrointestinal bleeding is bleeding somewhere along the path food travels through the body (digestive tract). This path is anywhere between the mouth and the opening of the butt (anus). You may have blood in your poop (stools) or have black poop. If you throw up (vomit), there may be blood in it. This condition can be mild, serious, or even life-threatening. If you have a lot of bleeding, you may need to stay in the hospital. Follow these instructions at home:  Take over-the-counter and prescription medicines only as told by your doctor.  Eat foods that have a lot of fiber in them. These foods include whole grains, fruits, and vegetables. You can also try eating 1-3 prunes each day.  Drink enough fluid to keep your pee (urine) clear or pale yellow.  Keep all follow-up visits as told by your doctor. This is important. Contact a doctor if:  Your symptoms do not get better. Get help right away if:  Your bleeding gets worse.  You feel dizzy or you pass out (faint).  You feel weak.  You have very bad cramps in your back or belly (abdomen).  You pass large clumps of blood (clots) in your poop.  Your symptoms are getting worse. This information is not intended to replace advice given to you by your health care provider. Make sure you discuss any questions you have with your health care provider. Document Released: 07/15/2008 Document Revised: 03/13/2016 Document Reviewed: 03/26/2015 Elsevier Interactive Patient  Education  2018 [employer].

## 2018-08-03 NOTE — Progress Notes (Signed)
Pt discharged home today per Dr. Laural Benes. Pt's IV site D/C'd and WDL. Pt's VSS. Pt provided with home medication list and discharge instructions. Verbalized understanding. Pt left floor via WC in stable condition accompanied by RN.

## 2018-08-03 NOTE — Discharge Summary (Addendum)
Physician Discharge Summary  [name]:[id number] DOB: 1931/09/23 DOA: 07/30/2018  PCP: Benita Stabile, MD GI: [facility] Admit date: 07/30/2018 Discharge date: 08/03/2018  Admitted From: Home  Disposition: Home  Recommendations for Outpatient Follow-up:  1. Follow up with PCP in 1 weeks 2. Please obtain CBC in one week to follow Hemoglobin 3. Holding eliquis until final pathology results available.   4. Please discuss restarting eliquis with patient when final pathology results available 5. Please follow up on the following pending results: Final pathology results  Home Health: YES  Discharge Condition: STABLE   CODE STATUS: FULL    Brief Hospitalization Summary: Please see all hospital notes, images, labs for full details of the hospitalization. HPI: [name] is a 84 y.o. female with medical history significant of asthma, COPD on home oxygen, CAD, history of old inferior wall MI, essential hypertension, history of PUD, history of previous GI bleed, hyperlipidemia, sick sinus syndrome, history of pacemaker placement, type 2 diabetes, paroxysmal atrial fibrillation who is coming to the emergency department with 2 weeks of progressively worse dyspnea, several days of worsening pitting edema of the lower extremities, melena, pale skin per daughter who was sent from her PCP office to the emergency department due to dyspnea.  She denies fever, chills, headache, rhinorrhea, sore throat, productive cough, wheezing, hemoptysis, chest pain, palpitations, dizziness, diaphoresis, PND orthopnea.  No abdominal pain, nausea, emesis, diarrhea, constipation or hematochezia.  She denies dysuria, frequency or hematuria.  No polyuria, polydipsia, polyphagia or blurred vision.  ED Course: Initial vital signs temperature 97.6 F, pulse 95, respirations 24, blood pressure 130/79 mmHg and O2 sat 100% on room air.  The patient was started on a 2 PRBC transfusion in the ED.  White count was  6.1 with a normal differential, hemoglobin 6.2 g/dL and platelets 811.  Fecal occult blood was positive.  Sodium 136, potassium 4.1, chloride 97 and CO2 30 mmol/L.  BUN 31, creatinine 1.25 and glucose 180 mg/dL.  Her LFTs show a decreased total albumin of 5.9 g/dL and an alkaline phosphatase of 36 units/L, all other values are within normal limits.  Her chest radiograph showed mild CHF.  84 year old female admitted with profound anemia, heme positive stool on Eliquis. Received 2 units PRBCs this admission. Pt was seen and evaluated by inpatient GI team.  EGD 10/14 with mild Schatzki ring, erosive gastropathy s/p biopsy, normal duodenum. Hgb stable without overt GI bleeding. From a GI standpoint, she is stable. GI team recommends holding Eliquis until pathology returns. If positive H.pylori, GI team will treat appropriately; otherwise, risks and benefits of continued anticoagulation will need to be assessed outpatient with GI and primary care provider. This was discussed with the patient who verbalized understanding.    The patient will be discharged to follow up with PCP and GI to discuss results of pathology and to discuss risks, benefits of restarting anticoagulation.  Pt also advised to discuss with her [facility] team as well.   She is not being discharged on eliquis at this time given that her pathology is still pending but she is arranged for outpatient GI and PCP follow up.    Acute blood loss anemia  Discharge Diagnoses:  Principal Problem:   UGIB (upper gastrointestinal bleed) Active Problems:   Type 2 diabetes mellitus (HCC)   Essential hypertension, benign   Coronary atherosclerosis of native coronary artery   COPD (chronic obstructive pulmonary disease) (HCC)   Hyperlipidemia   Atrial fibrillation (HCC)   Anemia  Chronic respiratory failure with hypoxia and hypercapnia (HCC)   Chronic systolic CHF (congestive heart failure) [facility])  Discharge Instructions: Discharge Instructions     Call MD for:  difficulty breathing, headache or visual disturbances   Complete by:  As directed    Call MD for:  extreme fatigue   Complete by:  As directed    Call MD for:  persistant dizziness or light-headedness   Complete by:  As directed    Diet - low sodium heart healthy   Complete by:  As directed    Increase activity slowly   Complete by:  As directed      Allergies as of 08/03/2018      Reactions   Codeine Nausea Only   Heparin Other (See Comments)   Causes internal bleeding       Medication List    STOP taking these medications   apixaban 5 MG Tabs tablet Commonly known as:  ELIQUIS     TAKE these medications   bisoprolol 5 MG tablet Commonly known as:  ZEBETA Take 0.5 tablets (2.5 mg total) by mouth daily. Pt needs appointment for further refills.   CENTRUM SILVER 50+WOMEN PO Take 1 tablet by mouth daily.   escitalopram 20 MG tablet Commonly known as:  LEXAPRO Take 20 mg by mouth at bedtime.   furosemide 40 MG tablet Commonly known as:  LASIX Take 1 tablet (40 mg total) by mouth 2 (two) times daily. Daily at 0800 am   ipratropium-albuterol 0.5-2.5 (3) MG/3ML Soln Commonly known as:  DUONEB Take 3 mLs by nebulization 3 (three) times daily.   LORazepam 0.5 MG tablet Commonly known as:  ATIVAN Take 1 tablet (0.5 mg total) by mouth at bedtime. Take one tablet by mouth once daily   Melatonin 10 MG Tabs Take 1 tablet by mouth at bedtime.   nitroGLYCERIN 0.4 MG SL tablet Commonly known as:  NITROSTAT Place 1 tablet (0.4 mg total) under the tongue every 5 (five) minutes as needed for chest pain.   omeprazole 20 MG capsule Commonly known as:  PRILOSEC Take 1 capsule (20 mg total) by mouth 2 (two) times daily before a meal.   potassium chloride 10 MEQ tablet Commonly known as:  K-DUR,KLOR-CON Take 10 mEq by mouth 2 (two) times daily.   vitamin B-12 1000 MCG tablet Commonly known as:  CYANOCOBALAMIN Take 1,000 mcg by mouth daily.       Follow-up Information    Benita Stabile, MD. Schedule an appointment as soon as possible for a visit in 1 week(s).   Specialty:  Internal Medicine Why:  Hospital Follow Up  Contact information: [address] [place] [facility] [address] [phone]        Jonelle Sidle, MD .   Specialty:  Cardiology Contact information: [address] [place] [address] [phone]        Marinus Maw, MD .   Specialty:  Cardiology Contact information: [address] [place] [address] [phone]        Gelene Mink, NP. Schedule an appointment as soon as possible for a visit in 2 month(s).   Specialty:  Gastroenterology Contact information: [address] [place] [address] [phone]          Allergies  Allergen Reactions  . Codeine Nausea Only  . Heparin Other (See Comments)    Causes internal bleeding    Allergies as of 08/03/2018      Reactions   Codeine  Nausea Only   Heparin Other (See Comments)   Causes internal bleeding       Medication List    STOP taking these medications   apixaban 5 MG Tabs tablet Commonly known as:  ELIQUIS     TAKE these medications   bisoprolol 5 MG tablet Commonly known as:  ZEBETA Take 0.5 tablets (2.5 mg total) by mouth daily. Pt needs appointment for further refills.   CENTRUM SILVER 50+WOMEN PO Take 1 tablet by mouth daily.   escitalopram 20 MG tablet Commonly known as:  LEXAPRO Take 20 mg by mouth at bedtime.   furosemide 40 MG tablet Commonly known as:  LASIX Take 1 tablet (40 mg total) by mouth 2 (two) times daily. Daily at 0800 am   ipratropium-albuterol 0.5-2.5 (3) MG/3ML Soln Commonly known as:  DUONEB Take 3 mLs by nebulization 3 (three) times daily.   LORazepam 0.5 MG tablet Commonly known as:  ATIVAN Take 1 tablet (0.5 mg total) by mouth at bedtime. Take one tablet by mouth once daily   Melatonin 10 MG Tabs Take 1 tablet by mouth at bedtime.   nitroGLYCERIN 0.4 MG SL  tablet Commonly known as:  NITROSTAT Place 1 tablet (0.4 mg total) under the tongue every 5 (five) minutes as needed for chest pain.   omeprazole 20 MG capsule Commonly known as:  PRILOSEC Take 1 capsule (20 mg total) by mouth 2 (two) times daily before a meal.   potassium chloride 10 MEQ tablet Commonly known as:  K-DUR,KLOR-CON Take 10 mEq by mouth 2 (two) times daily.   vitamin B-12 1000 MCG tablet Commonly known as:  CYANOCOBALAMIN Take 1,000 mcg by mouth daily.       Procedures/Studies: Dg Chest 2 View  Result Date: 07/30/2018 CLINICAL DATA:  Shortness of breath for 2 weeks EXAM: CHEST - 2 VIEW COMPARISON:  06/17/2018 FINDINGS: Small bilateral pleural effusions, left greater than right. Bilateral diffuse mild interstitial thickening. No pneumothorax. No focal consolidation. Stable cardiomegaly. Dual lead cardiac pacemaker. No acute osseous abnormality. IMPRESSION: 1. Findings concerning for mild CHF. Electronically Signed   By: Elige Ko   On: 07/30/2018 16:38      Subjective: Pt awake, alert, NAD, she says she feels better today and feels better than she felt yesterday.  No bleeding. Tolerating diet well.  Wants to go home today.    Discharge Exam: Vitals:   08/03/18 0544 08/03/18 0745  BP: 106/60   Pulse: 100   Resp: 18   Temp: 98.2 F (36.8 C)   SpO2: 99% 98%   Vitals:   08/02/18 2107 08/02/18 2255 08/03/18 0544 08/03/18 0745  BP: 124/66 (!) 104/59 106/60   Pulse: (!) 119 (!) 108 100   Resp: 18  18   Temp: 98.3 F (36.8 C)  98.2 F (36.8 C)   TempSrc: Oral  Oral   SpO2: 100% 97% 99% 98%  Weight:      Height:       General: Pt is alert, awake, not in acute distress Cardiovascular: RRR, S1/S2 +, no rubs, no gallops Respiratory: CTA bilaterally, no wheezing, no rhonchi Abdominal: Soft, NT, ND, bowel sounds + Extremities: no edema, no cyanosis   The results of significant diagnostics from this hospitalization (including imaging, microbiology,  ancillary and laboratory) are listed below for reference.     Microbiology: No results found for this or any previous visit (from the past 240 hour(s)).   Labs: BNP (last 3 results) Recent Labs  05/21/18 1129 05/23/18 0600 07/30/18 1839  BNP 787.0* 960.0* 1,020.0*   Basic Metabolic Panel: Recent Labs  Lab 07/30/18 1839 07/30/18 1950 07/31/18 0858 08/01/18 0520 08/02/18 0520 08/03/18 0414  NA 136  --  139 139 140 140  K 4.1  --  3.8 3.6 4.1 3.6  CL 97*  --  100 103 103 101  CO2 30  --  29 30 32 33*  GLUCOSE 108*  --  128* 111* 107* 127*  BUN 31*  --  26* 20 15 16   CREATININE 1.25*  --  1.11* 0.92 0.92 0.96  CALCIUM 8.7*  --  8.7* 8.7* 8.7* 8.6*  MG  --  2.2  --   --   --   --    Liver Function Tests: Recent Labs  Lab 07/30/18 1839  AST 17  ALT 20  ALKPHOS 36*  BILITOT 0.8  PROT 5.9*  ALBUMIN 3.6   No results for input(s): LIPASE, AMYLASE in the last 168 hours. No results for input(s): AMMONIA in the last 168 hours. CBC: Recent Labs  Lab 07/30/18 1839 07/31/18 0858 08/01/18 0520 08/02/18 0520 08/03/18 0414  WBC 6.1 7.1 6.1 5.9 6.7  NEUTROABS 4.0 4.6 3.9 3.7 4.3  HGB 6.2* 9.1* 8.4* 8.0* 8.5*  HCT 23.5* 32.2* 29.8* 29.9* 31.1*  MCV 83.6 84.7 87.1 87.2 89.1  PLT 282 271 215 222 224   Cardiac Enzymes: Recent Labs  Lab 07/30/18 1839  TROPONINI <0.03   BNP: Invalid input(s): POCBNP CBG: Recent Labs  Lab 08/02/18 1603 08/02/18 1627 08/02/18 2108 08/03/18 0714 08/03/18 1101  GLUCAP 121* 116* 161* 122* 213*   D-Dimer No results for input(s): DDIMER in the last 72 hours. Hgb A1c No results for input(s): HGBA1C in the last 72 hours. Lipid Profile No results for input(s): CHOL, HDL, LDLCALC, TRIG, CHOLHDL, LDLDIRECT in the last 72 hours. Thyroid function studies No results for input(s): TSH, T4TOTAL, T3FREE, THYROIDAB in the last 72 hours.  Invalid input(s): FREET3 Anemia work up No results for input(s): VITAMINB12, FOLATE, FERRITIN,  TIBC, IRON, RETICCTPCT in the last 72 hours. Urinalysis    Component Value Date/Time   COLORURINE YELLOW 05/23/2018 1250   APPEARANCEUR HAZY (A) 05/23/2018 1250   LABSPEC 1.018 05/23/2018 1250   PHURINE 5.0 05/23/2018 1250   GLUCOSEU NEGATIVE 05/23/2018 1250   HGBUR NEGATIVE 05/23/2018 1250   BILIRUBINUR NEGATIVE 05/23/2018 1250   KETONESUR 5 (A) 05/23/2018 1250   PROTEINUR 30 (A) 05/23/2018 1250   NITRITE NEGATIVE 05/23/2018 1250   LEUKOCYTESUR NEGATIVE 05/23/2018 1250   Sepsis Labs Invalid input(s): PROCALCITONIN,  WBC,  LACTICIDVEN Microbiology No results found for this or any previous visit (from the past 240 hour(s)).  Time coordinating discharge: 34 minutes  SIGNED:  Standley Dakins, MD  [facility] 08/03/2018, 11:28 AM Pager [phone]  If 7PM-7AM, please contact night-coverage [email or web address] Password [email or web address]

## 2018-08-04 ENCOUNTER — Encounter (HOSPITAL_COMMUNITY): Payer: Self-pay | Admitting: Internal Medicine

## 2018-08-05 ENCOUNTER — Other Ambulatory Visit: Payer: Self-pay

## 2018-08-05 DIAGNOSIS — Z862 Personal history of diseases of the blood and blood-forming organs and certain disorders involving the immune mechanism: Secondary | ICD-10-CM

## 2018-08-05 DIAGNOSIS — J440 Chronic obstructive pulmonary disease with acute lower respiratory infection: Secondary | ICD-10-CM | POA: Diagnosis not present

## 2018-08-05 DIAGNOSIS — I130 Hypertensive heart and chronic kidney disease with heart failure and stage 1 through stage 4 chronic kidney disease, or unspecified chronic kidney disease: Secondary | ICD-10-CM | POA: Diagnosis not present

## 2018-08-05 DIAGNOSIS — E1122 Type 2 diabetes mellitus with diabetic chronic kidney disease: Secondary | ICD-10-CM | POA: Diagnosis not present

## 2018-08-05 DIAGNOSIS — A420 Pulmonary actinomycosis: Secondary | ICD-10-CM | POA: Diagnosis not present

## 2018-08-05 DIAGNOSIS — I2510 Atherosclerotic heart disease of native coronary artery without angina pectoris: Secondary | ICD-10-CM | POA: Diagnosis not present

## 2018-08-05 DIAGNOSIS — I5043 Acute on chronic combined systolic (congestive) and diastolic (congestive) heart failure: Secondary | ICD-10-CM | POA: Diagnosis not present

## 2018-08-05 NOTE — Telephone Encounter (Signed)
Spoke with pt. She is going to have her daughter call to discuss.

## 2018-08-05 NOTE — Telephone Encounter (Signed)
Spoke with pts daughter. Lab orders were placed and faxed to [facility] per daughters request.

## 2018-08-06 ENCOUNTER — Other Ambulatory Visit: Payer: Self-pay

## 2018-08-06 ENCOUNTER — Telehealth: Payer: Self-pay | Admitting: Cardiology

## 2018-08-06 ENCOUNTER — Encounter: Payer: Self-pay | Admitting: Internal Medicine

## 2018-08-06 ENCOUNTER — Telehealth: Payer: Self-pay

## 2018-08-06 NOTE — Telephone Encounter (Signed)
Per d/c summary on 08/03/18, pt to remain off eliquis until dr Jena Gauss calls her with pathology results.She states she has not heard back from his office.She will call them Monday 08/09/18

## 2018-08-06 NOTE — Telephone Encounter (Signed)
Wants to know if she is suppose to stay off her Eliquis.  It was stopped for her to have procedure. Dr Jena Gauss took her off

## 2018-08-06 NOTE — Telephone Encounter (Signed)
Routing to [place] to cc pcp- looks like she already has ov with AB.

## 2018-08-06 NOTE — Patient Outreach (Signed)
[facility] Customer service manager [facility]) Care Management  08/06/2018  [name] Mar 01, 1931 [id number]   Successful outreach with [name]. She was evaluated in the ED since last outreach. She reported feeling ok today but still experiencing fatigue. No complaints of shortness of breath or urgent concerns at the time of the call. Reported taking medications as prescribed and holding Eloquis until follow up with [name]. Scheduled for follow up with Cardiologist in November. Pending MD/PCP follow up on next week.   PLAN Will follow up on next week.  [name] [facility] Community Care Manager [phone]

## 2018-08-06 NOTE — Telephone Encounter (Signed)
Per RMR-  Send letter to patient.  Send copy of letter with path to referring provider and PCP.   Office visit with [place] in 6 to 8 weeks to determine whether or not patient is interested in further GI evaluation as to the cause of GI bleeding.

## 2018-08-07 DIAGNOSIS — I5043 Acute on chronic combined systolic (congestive) and diastolic (congestive) heart failure: Secondary | ICD-10-CM | POA: Diagnosis not present

## 2018-08-07 DIAGNOSIS — E1122 Type 2 diabetes mellitus with diabetic chronic kidney disease: Secondary | ICD-10-CM | POA: Diagnosis not present

## 2018-08-07 DIAGNOSIS — I2510 Atherosclerotic heart disease of native coronary artery without angina pectoris: Secondary | ICD-10-CM | POA: Diagnosis not present

## 2018-08-07 DIAGNOSIS — I130 Hypertensive heart and chronic kidney disease with heart failure and stage 1 through stage 4 chronic kidney disease, or unspecified chronic kidney disease: Secondary | ICD-10-CM | POA: Diagnosis not present

## 2018-08-07 DIAGNOSIS — J440 Chronic obstructive pulmonary disease with acute lower respiratory infection: Secondary | ICD-10-CM | POA: Diagnosis not present

## 2018-08-07 DIAGNOSIS — A420 Pulmonary actinomycosis: Secondary | ICD-10-CM | POA: Diagnosis not present

## 2018-08-09 ENCOUNTER — Telehealth: Payer: Self-pay

## 2018-08-09 ENCOUNTER — Other Ambulatory Visit (HOSPITAL_COMMUNITY)
Admission: RE | Admit: 2018-08-09 | Discharge: 2018-08-09 | Disposition: A | Discharge status: Home / Self Care | Payer: Medicare Other | Source: Ambulatory Visit | Attending: Gastroenterology | Admitting: Gastroenterology

## 2018-08-09 DIAGNOSIS — I130 Hypertensive heart and chronic kidney disease with heart failure and stage 1 through stage 4 chronic kidney disease, or unspecified chronic kidney disease: Secondary | ICD-10-CM | POA: Diagnosis not present

## 2018-08-09 DIAGNOSIS — Z862 Personal history of diseases of the blood and blood-forming organs and certain disorders involving the immune mechanism: Secondary | ICD-10-CM | POA: Insufficient documentation

## 2018-08-09 DIAGNOSIS — E1122 Type 2 diabetes mellitus with diabetic chronic kidney disease: Secondary | ICD-10-CM | POA: Diagnosis not present

## 2018-08-09 DIAGNOSIS — J440 Chronic obstructive pulmonary disease with acute lower respiratory infection: Secondary | ICD-10-CM | POA: Diagnosis not present

## 2018-08-09 DIAGNOSIS — A420 Pulmonary actinomycosis: Secondary | ICD-10-CM | POA: Diagnosis not present

## 2018-08-09 DIAGNOSIS — I2510 Atherosclerotic heart disease of native coronary artery without angina pectoris: Secondary | ICD-10-CM | POA: Diagnosis not present

## 2018-08-09 DIAGNOSIS — I5043 Acute on chronic combined systolic (congestive) and diastolic (congestive) heart failure: Secondary | ICD-10-CM | POA: Diagnosis not present

## 2018-08-09 LAB — CBC WITH DIFFERENTIAL/PLATELET
Abs Immature Granulocytes: 0.02 10*3/uL (ref 0.00–0.07)
BASOS ABS: 0 10*3/uL (ref 0.0–0.1)
BASOS PCT: 0 %
EOS ABS: 0 10*3/uL (ref 0.0–0.5)
EOS PCT: 1 %
HCT: 32.8 % — ABNORMAL LOW (ref 36.0–46.0)
Hemoglobin: 8.9 g/dL — ABNORMAL LOW (ref 12.0–15.0)
Immature Granulocytes: 0 %
LYMPHS PCT: 17 %
Lymphs Abs: 0.9 10*3/uL (ref 0.7–4.0)
MCH: 23.4 pg — AB (ref 26.0–34.0)
MCHC: 27.1 g/dL — AB (ref 30.0–36.0)
MCV: 86.1 fL (ref 80.0–100.0)
MONO ABS: 0.6 10*3/uL (ref 0.1–1.0)
Monocytes Relative: 11 %
Neutro Abs: 3.8 10*3/uL (ref 1.7–7.7)
Neutrophils Relative %: 71 %
PLATELETS: 219 10*3/uL (ref 150–400)
RBC: 3.81 MIL/uL — AB (ref 3.87–5.11)
RDW: 18.4 % — AB (ref 11.5–15.5)
WBC: 5.4 10*3/uL (ref 4.0–10.5)
nRBC: 0 % (ref 0.0–0.2)

## 2018-08-09 NOTE — Telephone Encounter (Signed)
Pt called to discuss her results. Results were given over the phone and letter was mailed to pt. Pt would like to know if there was no tumor or infection, what caused her bleeding? Please advise.

## 2018-08-10 NOTE — Telephone Encounter (Signed)
OV made and cc'ed to PCP

## 2018-08-11 DIAGNOSIS — A420 Pulmonary actinomycosis: Secondary | ICD-10-CM | POA: Diagnosis not present

## 2018-08-11 DIAGNOSIS — J440 Chronic obstructive pulmonary disease with acute lower respiratory infection: Secondary | ICD-10-CM | POA: Diagnosis not present

## 2018-08-11 DIAGNOSIS — I5043 Acute on chronic combined systolic (congestive) and diastolic (congestive) heart failure: Secondary | ICD-10-CM | POA: Diagnosis not present

## 2018-08-11 DIAGNOSIS — I2510 Atherosclerotic heart disease of native coronary artery without angina pectoris: Secondary | ICD-10-CM | POA: Diagnosis not present

## 2018-08-11 DIAGNOSIS — I130 Hypertensive heart and chronic kidney disease with heart failure and stage 1 through stage 4 chronic kidney disease, or unspecified chronic kidney disease: Secondary | ICD-10-CM | POA: Diagnosis not present

## 2018-08-11 DIAGNOSIS — E1122 Type 2 diabetes mellitus with diabetic chronic kidney disease: Secondary | ICD-10-CM | POA: Diagnosis not present

## 2018-08-12 ENCOUNTER — Other Ambulatory Visit: Payer: Self-pay

## 2018-08-12 DIAGNOSIS — D509 Iron deficiency anemia, unspecified: Secondary | ICD-10-CM

## 2018-08-12 NOTE — Progress Notes (Signed)
Hgb improved. She was hospitalized with GI bleed. We need to have it repeated in 2 weeks, and she needs an office visit in next 4 weeks (She is scheduled for Jan but that is too far).

## 2018-08-12 NOTE — Progress Notes (Signed)
CALLED PATIENT AND MOVED TO URGENT [facility] 09/20/18

## 2018-08-13 ENCOUNTER — Other Ambulatory Visit: Payer: Self-pay

## 2018-08-13 DIAGNOSIS — D631 Anemia in chronic kidney disease: Secondary | ICD-10-CM | POA: Diagnosis not present

## 2018-08-13 DIAGNOSIS — M6281 Muscle weakness (generalized): Secondary | ICD-10-CM | POA: Diagnosis not present

## 2018-08-13 DIAGNOSIS — I5032 Chronic diastolic (congestive) heart failure: Secondary | ICD-10-CM | POA: Diagnosis not present

## 2018-08-13 DIAGNOSIS — D649 Anemia, unspecified: Secondary | ICD-10-CM | POA: Diagnosis not present

## 2018-08-13 DIAGNOSIS — J9601 Acute respiratory failure with hypoxia: Secondary | ICD-10-CM | POA: Diagnosis not present

## 2018-08-13 DIAGNOSIS — I4891 Unspecified atrial fibrillation: Secondary | ICD-10-CM | POA: Diagnosis not present

## 2018-08-13 DIAGNOSIS — E1169 Type 2 diabetes mellitus with other specified complication: Secondary | ICD-10-CM | POA: Diagnosis not present

## 2018-08-13 DIAGNOSIS — K922 Gastrointestinal hemorrhage, unspecified: Secondary | ICD-10-CM | POA: Diagnosis not present

## 2018-08-13 DIAGNOSIS — Z6825 Body mass index (BMI) 25.0-25.9, adult: Secondary | ICD-10-CM | POA: Diagnosis not present

## 2018-08-13 DIAGNOSIS — K2970 Gastritis, unspecified, without bleeding: Secondary | ICD-10-CM | POA: Diagnosis not present

## 2018-08-16 DIAGNOSIS — I2510 Atherosclerotic heart disease of native coronary artery without angina pectoris: Secondary | ICD-10-CM | POA: Diagnosis not present

## 2018-08-16 DIAGNOSIS — I130 Hypertensive heart and chronic kidney disease with heart failure and stage 1 through stage 4 chronic kidney disease, or unspecified chronic kidney disease: Secondary | ICD-10-CM | POA: Diagnosis not present

## 2018-08-16 DIAGNOSIS — I5043 Acute on chronic combined systolic (congestive) and diastolic (congestive) heart failure: Secondary | ICD-10-CM | POA: Diagnosis not present

## 2018-08-16 DIAGNOSIS — E1122 Type 2 diabetes mellitus with diabetic chronic kidney disease: Secondary | ICD-10-CM | POA: Diagnosis not present

## 2018-08-16 DIAGNOSIS — A420 Pulmonary actinomycosis: Secondary | ICD-10-CM | POA: Diagnosis not present

## 2018-08-16 DIAGNOSIS — J440 Chronic obstructive pulmonary disease with acute lower respiratory infection: Secondary | ICD-10-CM | POA: Diagnosis not present

## 2018-08-16 NOTE — Patient Outreach (Addendum)
[facility] Customer service manager [facility]) Care Management  08/13/2018  [name] May 25, 1931 [id number]   Follow up outreach with Ms. Crisman. She reported doing well  and was preparing for appointment with PCP. No urgent concerns. Agreed to contact RNCM if needed prior to next outreach.   ADDENDUM Follow up outreach with [facility] staff to discuss possible need for outreach from COPD nurse.    PLAN Will follow up and schedule home visit on next month.  Will discuss need for additional outreach from COPD nurse or outreach from [facility].   [name] [facility] Community Care Manager [phone]

## 2018-08-17 DIAGNOSIS — E1122 Type 2 diabetes mellitus with diabetic chronic kidney disease: Secondary | ICD-10-CM | POA: Diagnosis not present

## 2018-08-17 DIAGNOSIS — I130 Hypertensive heart and chronic kidney disease with heart failure and stage 1 through stage 4 chronic kidney disease, or unspecified chronic kidney disease: Secondary | ICD-10-CM | POA: Diagnosis not present

## 2018-08-17 DIAGNOSIS — J440 Chronic obstructive pulmonary disease with acute lower respiratory infection: Secondary | ICD-10-CM | POA: Diagnosis not present

## 2018-08-17 DIAGNOSIS — I2510 Atherosclerotic heart disease of native coronary artery without angina pectoris: Secondary | ICD-10-CM | POA: Diagnosis not present

## 2018-08-17 DIAGNOSIS — A420 Pulmonary actinomycosis: Secondary | ICD-10-CM | POA: Diagnosis not present

## 2018-08-17 DIAGNOSIS — I5043 Acute on chronic combined systolic (congestive) and diastolic (congestive) heart failure: Secondary | ICD-10-CM | POA: Diagnosis not present

## 2018-08-18 DIAGNOSIS — I5043 Acute on chronic combined systolic (congestive) and diastolic (congestive) heart failure: Secondary | ICD-10-CM | POA: Diagnosis not present

## 2018-08-18 DIAGNOSIS — A420 Pulmonary actinomycosis: Secondary | ICD-10-CM | POA: Diagnosis not present

## 2018-08-18 DIAGNOSIS — I130 Hypertensive heart and chronic kidney disease with heart failure and stage 1 through stage 4 chronic kidney disease, or unspecified chronic kidney disease: Secondary | ICD-10-CM | POA: Diagnosis not present

## 2018-08-18 DIAGNOSIS — I2510 Atherosclerotic heart disease of native coronary artery without angina pectoris: Secondary | ICD-10-CM | POA: Diagnosis not present

## 2018-08-18 DIAGNOSIS — E1122 Type 2 diabetes mellitus with diabetic chronic kidney disease: Secondary | ICD-10-CM | POA: Diagnosis not present

## 2018-08-18 DIAGNOSIS — J440 Chronic obstructive pulmonary disease with acute lower respiratory infection: Secondary | ICD-10-CM | POA: Diagnosis not present

## 2018-08-19 DIAGNOSIS — A420 Pulmonary actinomycosis: Secondary | ICD-10-CM | POA: Diagnosis not present

## 2018-08-19 DIAGNOSIS — I2510 Atherosclerotic heart disease of native coronary artery without angina pectoris: Secondary | ICD-10-CM | POA: Diagnosis not present

## 2018-08-19 DIAGNOSIS — J440 Chronic obstructive pulmonary disease with acute lower respiratory infection: Secondary | ICD-10-CM | POA: Diagnosis not present

## 2018-08-19 DIAGNOSIS — I5043 Acute on chronic combined systolic (congestive) and diastolic (congestive) heart failure: Secondary | ICD-10-CM | POA: Diagnosis not present

## 2018-08-19 DIAGNOSIS — E1122 Type 2 diabetes mellitus with diabetic chronic kidney disease: Secondary | ICD-10-CM | POA: Diagnosis not present

## 2018-08-19 DIAGNOSIS — I130 Hypertensive heart and chronic kidney disease with heart failure and stage 1 through stage 4 chronic kidney disease, or unspecified chronic kidney disease: Secondary | ICD-10-CM | POA: Diagnosis not present

## 2018-08-20 NOTE — Telephone Encounter (Signed)
Patient could have bled anywhere along her GI tract.  Anticoagulation therapy can make anything bleed. To evaluate further, we would do need to do more testing.  Would offer a follow-up appointment with [place] if not already schedule -if needed.

## 2018-08-23 DIAGNOSIS — I5043 Acute on chronic combined systolic (congestive) and diastolic (congestive) heart failure: Secondary | ICD-10-CM | POA: Diagnosis not present

## 2018-08-23 DIAGNOSIS — I2510 Atherosclerotic heart disease of native coronary artery without angina pectoris: Secondary | ICD-10-CM | POA: Diagnosis not present

## 2018-08-23 DIAGNOSIS — A420 Pulmonary actinomycosis: Secondary | ICD-10-CM | POA: Diagnosis not present

## 2018-08-23 DIAGNOSIS — E1122 Type 2 diabetes mellitus with diabetic chronic kidney disease: Secondary | ICD-10-CM | POA: Diagnosis not present

## 2018-08-23 DIAGNOSIS — J440 Chronic obstructive pulmonary disease with acute lower respiratory infection: Secondary | ICD-10-CM | POA: Diagnosis not present

## 2018-08-23 DIAGNOSIS — I130 Hypertensive heart and chronic kidney disease with heart failure and stage 1 through stage 4 chronic kidney disease, or unspecified chronic kidney disease: Secondary | ICD-10-CM | POA: Diagnosis not present

## 2018-08-24 NOTE — Telephone Encounter (Signed)
Noted. Pt notified and will be seen by AB on 09/20/18.

## 2018-08-25 DIAGNOSIS — J440 Chronic obstructive pulmonary disease with acute lower respiratory infection: Secondary | ICD-10-CM | POA: Diagnosis not present

## 2018-08-25 DIAGNOSIS — I130 Hypertensive heart and chronic kidney disease with heart failure and stage 1 through stage 4 chronic kidney disease, or unspecified chronic kidney disease: Secondary | ICD-10-CM | POA: Diagnosis not present

## 2018-08-25 DIAGNOSIS — E1122 Type 2 diabetes mellitus with diabetic chronic kidney disease: Secondary | ICD-10-CM | POA: Diagnosis not present

## 2018-08-25 DIAGNOSIS — I5043 Acute on chronic combined systolic (congestive) and diastolic (congestive) heart failure: Secondary | ICD-10-CM | POA: Diagnosis not present

## 2018-08-25 DIAGNOSIS — I2510 Atherosclerotic heart disease of native coronary artery without angina pectoris: Secondary | ICD-10-CM | POA: Diagnosis not present

## 2018-08-25 DIAGNOSIS — A420 Pulmonary actinomycosis: Secondary | ICD-10-CM | POA: Diagnosis not present

## 2018-08-27 DIAGNOSIS — I2510 Atherosclerotic heart disease of native coronary artery without angina pectoris: Secondary | ICD-10-CM | POA: Diagnosis not present

## 2018-08-27 DIAGNOSIS — I130 Hypertensive heart and chronic kidney disease with heart failure and stage 1 through stage 4 chronic kidney disease, or unspecified chronic kidney disease: Secondary | ICD-10-CM | POA: Diagnosis not present

## 2018-08-27 DIAGNOSIS — A420 Pulmonary actinomycosis: Secondary | ICD-10-CM | POA: Diagnosis not present

## 2018-08-27 DIAGNOSIS — I5043 Acute on chronic combined systolic (congestive) and diastolic (congestive) heart failure: Secondary | ICD-10-CM | POA: Diagnosis not present

## 2018-08-27 DIAGNOSIS — N390 Urinary tract infection, site not specified: Secondary | ICD-10-CM | POA: Diagnosis not present

## 2018-08-27 DIAGNOSIS — E1122 Type 2 diabetes mellitus with diabetic chronic kidney disease: Secondary | ICD-10-CM | POA: Diagnosis not present

## 2018-08-27 DIAGNOSIS — J440 Chronic obstructive pulmonary disease with acute lower respiratory infection: Secondary | ICD-10-CM | POA: Diagnosis not present

## 2018-08-30 DIAGNOSIS — K922 Gastrointestinal hemorrhage, unspecified: Secondary | ICD-10-CM | POA: Diagnosis not present

## 2018-08-30 DIAGNOSIS — N183 Chronic kidney disease, stage 3 (moderate): Secondary | ICD-10-CM | POA: Diagnosis not present

## 2018-08-30 DIAGNOSIS — K222 Esophageal obstruction: Secondary | ICD-10-CM | POA: Diagnosis not present

## 2018-08-30 DIAGNOSIS — Z79899 Other long term (current) drug therapy: Secondary | ICD-10-CM | POA: Diagnosis not present

## 2018-08-30 DIAGNOSIS — Z950 Presence of cardiac pacemaker: Secondary | ICD-10-CM | POA: Diagnosis not present

## 2018-08-30 DIAGNOSIS — K3189 Other diseases of stomach and duodenum: Secondary | ICD-10-CM | POA: Diagnosis not present

## 2018-08-30 DIAGNOSIS — Z955 Presence of coronary angioplasty implant and graft: Secondary | ICD-10-CM | POA: Diagnosis not present

## 2018-08-30 DIAGNOSIS — I480 Paroxysmal atrial fibrillation: Secondary | ICD-10-CM | POA: Diagnosis not present

## 2018-08-30 DIAGNOSIS — I130 Hypertensive heart and chronic kidney disease with heart failure and stage 1 through stage 4 chronic kidney disease, or unspecified chronic kidney disease: Secondary | ICD-10-CM | POA: Diagnosis not present

## 2018-08-30 DIAGNOSIS — F329 Major depressive disorder, single episode, unspecified: Secondary | ICD-10-CM | POA: Diagnosis not present

## 2018-08-30 DIAGNOSIS — J449 Chronic obstructive pulmonary disease, unspecified: Secondary | ICD-10-CM | POA: Diagnosis not present

## 2018-08-30 DIAGNOSIS — Z87891 Personal history of nicotine dependence: Secondary | ICD-10-CM | POA: Diagnosis not present

## 2018-08-30 DIAGNOSIS — I495 Sick sinus syndrome: Secondary | ICD-10-CM | POA: Diagnosis not present

## 2018-08-30 DIAGNOSIS — Z7901 Long term (current) use of anticoagulants: Secondary | ICD-10-CM | POA: Diagnosis not present

## 2018-08-30 DIAGNOSIS — Z9981 Dependence on supplemental oxygen: Secondary | ICD-10-CM | POA: Diagnosis not present

## 2018-08-30 DIAGNOSIS — F419 Anxiety disorder, unspecified: Secondary | ICD-10-CM | POA: Diagnosis not present

## 2018-08-30 DIAGNOSIS — I2510 Atherosclerotic heart disease of native coronary artery without angina pectoris: Secondary | ICD-10-CM | POA: Diagnosis not present

## 2018-08-30 DIAGNOSIS — D631 Anemia in chronic kidney disease: Secondary | ICD-10-CM | POA: Diagnosis not present

## 2018-08-30 DIAGNOSIS — E1122 Type 2 diabetes mellitus with diabetic chronic kidney disease: Secondary | ICD-10-CM | POA: Diagnosis not present

## 2018-08-30 DIAGNOSIS — J9621 Acute and chronic respiratory failure with hypoxia: Secondary | ICD-10-CM | POA: Diagnosis not present

## 2018-08-30 DIAGNOSIS — I5043 Acute on chronic combined systolic (congestive) and diastolic (congestive) heart failure: Secondary | ICD-10-CM | POA: Diagnosis not present

## 2018-09-01 DIAGNOSIS — I5043 Acute on chronic combined systolic (congestive) and diastolic (congestive) heart failure: Secondary | ICD-10-CM | POA: Diagnosis not present

## 2018-09-01 DIAGNOSIS — I2510 Atherosclerotic heart disease of native coronary artery without angina pectoris: Secondary | ICD-10-CM | POA: Diagnosis not present

## 2018-09-01 DIAGNOSIS — I130 Hypertensive heart and chronic kidney disease with heart failure and stage 1 through stage 4 chronic kidney disease, or unspecified chronic kidney disease: Secondary | ICD-10-CM | POA: Diagnosis not present

## 2018-09-01 DIAGNOSIS — N183 Chronic kidney disease, stage 3 (moderate): Secondary | ICD-10-CM | POA: Diagnosis not present

## 2018-09-01 DIAGNOSIS — E1122 Type 2 diabetes mellitus with diabetic chronic kidney disease: Secondary | ICD-10-CM | POA: Diagnosis not present

## 2018-09-01 DIAGNOSIS — J449 Chronic obstructive pulmonary disease, unspecified: Secondary | ICD-10-CM | POA: Diagnosis not present

## 2018-09-01 DIAGNOSIS — Z950 Presence of cardiac pacemaker: Secondary | ICD-10-CM | POA: Insufficient documentation

## 2018-09-01 NOTE — Progress Notes (Signed)
Cardiology Office Note    Date:  09/06/2018   ID:  [name], DOB 1931-09-30, MRN [id number]  PCP:  Benita Stabile, MD  Cardiologist: Nona Dell, MD EPS: Lewayne Bunting, MD  Chief Complaint  Patient presents with  . Hospitalization Follow-up    History of Present Illness:  [name] is a 84 y.o. female history of PAF on Eliquis, symptomatic sinus node dysfunction status post pacemaker by Dr. Ladona Ridgel, chronic systolic and diastolic CHF LVEF 45% echo 05/2018, COPD on home O2.  Last saw Dr. Ladona Ridgel 07/21/2018 at which time she was doing well pacemaker working normally.  Patient was discharged from the hospital 08/03/2018 after admission with significant GI bleed with profound anemia on Eliquis.  Received 2 units of packed RBCs.  EGD 08/02/2018 mild Schatzki ring, erosive gastropathy status post biopsy and normal duodenum.  Plan was to hold the Eliquis until pathology was returned.  Last hemoglobin 8.9 on 08/09/2018.  Patient comes in today for follow-up accompanied by a friend.  She had increased weight gain last week from 160 pounds to 165 pounds.  Her Demadex was increased to 40 mg in the morning 20 in the afternoon.  Her weight is staying about the same.  She is short of breath with very little activity in her house.  She is still living alone but her daughter brings meals to her and home health is checking on her twice a week.  She is very weak.  She is on oxygen.  She was restarted on  Eliquis 5 mg twice daily.  Weight is 169 pounds today.  She weighed 153 pounds October 2 when she saw Dr. Ladona Ridgel.  Past Medical History:  Diagnosis Date  . Asthma   . COPD (chronic obstructive pulmonary disease) (HCC)    Oxygen dependent  . Coronary atherosclerosis of native coronary artery   . Essential hypertension, benign   . History of GI bleed    Ulcer  . Hyperlipidemia   . Old inferior wall myocardial infarction 1999   [facility]  . PAF (paroxysmal atrial fibrillation) (HCC)   .  Sinus node dysfunction (HCC)    Medtr[employer] PPM  . Type 2 diabetes mellitus (HCC)     Past Surgical History:  Procedure Laterality Date  . BIOPSY  08/02/2018   Procedure: BIOPSY;  Surgeon: Corbin Ade, MD;  Location: [facility];  Service: Endoscopy;;  gastric  . Cesareen section    . CHOLECYSTECTOMY    . COLONOSCOPY  09/2004   RMR: For obscure GI bleed. Left-sided diverticula, otherwise normal ileocolonoscopy  . Coronary artery stent  1999   BMS x 3 RCA  ([facility]), PTCRA RC4  . EP IMPLANTABLE DEVICE N/A 06/26/2016   Procedure: Pacemaker Implant;  Surgeon: Will Jorja Loa, MD;  Location: [facility];  Service: Cardiovascular;  Laterality: N/A;  . ESOPHAGOGASTRODUODENOSCOPY  11 2005   RMR: For melena, normal exam.  . ESOPHAGOGASTRODUODENOSCOPY (EGD) WITH PROPOFOL N/A 08/02/2018   Procedure: ESOPHAGOGASTRODUODENOSCOPY (EGD) WITH PROPOFOL;  Surgeon: Corbin Ade, MD;  Location: [facility];  Service: Endoscopy;  Laterality: N/A;  . EXPLORATORY LAPAROTOMY    . GIVENS CAPSULE STUDY  08/2004   official report unavailable, but reported couple of AVMs, nonbleeding  . IR PERC PLEURAL DRAIN W/INDWELL CATH W/IMG GUIDE  05/27/2018  . PARTIAL COLECTOMY  1977   blockage/gangrene? unclear if small bowel or colon  . THORACENTESIS Left 03/10/16   1 L transudative fluid  Current Medications: Current Meds  Medication Sig  . ciprofloxacin (CIPRO) 500 MG tablet Take 500 mg by mouth 2 (two) times daily.  Marland Kitchen escitalopram (LEXAPRO) 20 MG tablet Take 20 mg by mouth at bedtime.   . ferrous sulfate 325 (65 FE) MG EC tablet Take 325 mg by mouth daily.  Marland Kitchen ipratropium-albuterol (DUONEB) 0.5-2.5 (3) MG/3ML SOLN Take 3 mLs by nebulization 3 (three) times daily.  Marland Kitchen LORazepam (ATIVAN) 0.5 MG tablet Take 1 tablet (0.5 mg total) by mouth at bedtime. Take one tablet by mouth once daily  . Melatonin 10 MG TABS Take 1 tablet by mouth at bedtime.  . Multiple Vitamins-Minerals (CENTRUM SILVER 50+WOMEN  PO) Take 1 tablet by mouth daily.  . nitroGLYCERIN (NITROSTAT) 0.4 MG SL tablet Place 1 tablet (0.4 mg total) under the tongue every 5 (five) minutes as needed for chest pain.  . pantoprazole (PROTONIX) 20 MG tablet Take 20 mg by mouth daily.  . potassium chloride (K-DUR,KLOR-CON) 10 MEQ tablet Take 10 mEq by mouth 2 (two) times daily.  Marland Kitchen torsemide (DEMADEX) 20 MG tablet Take 20 mg by mouth daily. Pt taking 40 mg in am & 20 mg pm  . vitamin B-12 (CYANOCOBALAMIN) 1000 MCG tablet Take 1,000 mcg by mouth daily.  . [DISCONTINUED] apixaban (ELIQUIS) 5 MG TABS tablet Take 5 mg by mouth 2 (two) times daily.  . [DISCONTINUED] bisoprolol (ZEBETA) 5 MG tablet Take 0.5 tablets (2.5 mg total) by mouth daily. Pt needs appointment for further refills.  . [DISCONTINUED] bisoprolol (ZEBETA) 5 MG tablet Take 1 tablet (5 mg total) by mouth daily. Pt needs appointment for further refills.  . [DISCONTINUED] furosemide (LASIX) 40 MG tablet Take 1 tablet (40 mg total) by mouth 2 (two) times daily. Daily at 0800 am  . [DISCONTINUED] omeprazole (PRILOSEC) 20 MG capsule Take 1 capsule (20 mg total) by mouth 2 (two) times daily before a meal.     Allergies:   Codeine and Heparin   Social History   Socioeconomic History  . Marital status: Widowed    Spouse name: Not on file  . Number of children: 2  . Years of education: Not on file  . Highest education level: Not on file  Occupational History  . Not on file  Social Needs  . Financial resource strain: Not on file  . Food insecurity:    Worry: Not on file    Inability: Not on file  . Transportation needs:    Medical: Not on file    Non-medical: Not on file  Tobacco Use  . Smoking status: Former Smoker    Packs/day: 2.00    Years: 40.00    Pack years: 80.00    Types: Cigarettes    Last attempt to quit: 01/13/1998    Years since quitting: 20.6  . Smokeless tobacco: Never Used  Substance and Sexual Activity  . Alcohol use: No    Alcohol/week: 0.0  standard drinks  . Drug use: No  . Sexual activity: Not on file  Lifestyle  . Physical activity:    Days per week: Not on file    Minutes per session: Not on file  . Stress: Not on file  Relationships  . Social connections:    Talks on phone: Not on file    Gets together: Not on file    Attends religious service: Not on file    Active member of club or organization: Not on file    Attends meetings of clubs or organizations: Not  on file    Relationship status: Not on file  Other Topics Concern  . Not on file  Social History Narrative  . Not on file     Family History:  The patient's family history includes Cancer in her brother and sister; Diabetes in her brother, father, and mother; Heart disease in her father.   ROS:   Please see the history of present illness.    Review of Systems  Constitution: Positive for malaise/fatigue.  Cardiovascular: Positive for dyspnea on exertion, irregular heartbeat and leg swelling.  Respiratory: Positive for shortness of breath.   Neurological: Positive for weakness.   All other systems reviewed and are negative.   PHYSICAL EXAM:   VS:  BP 106/60 (BP Location: Left Arm)   Pulse 74   Ht 5\' 4"  (1.626 m)   Wt 169 lb (76.7 kg)   SpO2 94%   BMI 29.01 kg/m   Physical Exam  GEN: Well nourished, well developed, elderly looking, in no acute distress on oxygen Neck: no JVD, carotid bruits, or masses Cardiac:RRR; no murmurs, rubs, or gallops  Respiratory:  clear to auscultation bilaterally, normal work of breathing GI: soft, nontender, nondistended, + BS Ext: +2-3 edema on the left, +1-2 edema on the right without cyanosis, clubbing, decreased distal pulses bilaterally Neuro:  Alert and Oriented x 3 Psych: euthymic mood, full affect  Wt Readings from Last 3 Encounters:  09/06/18 169 lb (76.7 kg)  07/30/18 153 lb (69.4 kg)  07/21/18 153 lb (69.4 kg)      Studies/Labs Reviewed:   EKG:  EKG is ordered today.  The ekg ordered today  demonstrates atrial fibrillation at 114/m  Recent Labs: 05/21/2018: TSH 2.866 07/30/2018: ALT 20; B Natriuretic Peptide 1,020.0; Magnesium 2.2 08/03/2018: BUN 16; Creatinine, Ser 0.96; Potassium 3.6; Sodium 140 08/09/2018: Hemoglobin 8.9; Platelets 219   Lipid Panel No results found for: CHOL, TRIG, HDL, CHOLHDL, VLDL, LDLCALC, LDLDIRECT  Additional studies/ records that were reviewed today include:   Echo 8/3/2019Study Conclusions   - Left ventricle: The cavity size was normal. Wall thickness was   increased in a pattern of mild LVH. Systolic function was mildly   reduced. The estimated ejection fraction was 45%. Diffuse   hypokinesis. The study is not technically sufficient to allow   evaluation of LV diastolic function. - Regional wall motion abnormality: Akinesis of the basal inferior   myocardium; hypokinesis of the basal inferolateral myocardium. - Ventricular septum: Septal motion showed abnormal function and   dyssynergy. These changes are consistent with intraventricular   conduction delay. - Aortic valve: Mildly calcified annulus. Trileaflet; mildly   thickened, mildly calcified leaflets. Morphologically, there   appears to be mild calcific aortic stenosis. - Mitral valve: There was moderate regurgitation. - Left atrium: The atrium was severely dilated. - Right ventricle: The cavity size was severely dilated. Systolic   function was severely reduced. - Right atrium: The atrium was moderately dilated. - Tricuspid valve: There was mild regurgitation. - Systemic veins: IVC is dilated with normal respiratory variation.   Estimated CVP 8 mmHg.    Echocardiogram 03/26/2017: Study Conclusions   - Left ventricle: The cavity size was normal. Wall thickness was   increased in a pattern of mild LVH. Systolic function was mildly   to moderately reduced. The estimated ejection fraction was in the   range of 40% to 45%. Diffuse hypokinesis. Doppler parameters are   consistent  with abnormal left ventricular relaxation (grade 1   diastolic dysfunction). - Regional  wall motion abnormality: Akinesis of the mid inferior   and basal-mid inferolateral myocardium. - Ventricular septum: Septal motion showed abnormal function and   dyssynergy. These changes are consistent with intraventricular   conduction delay. - Aortic valve: Trileaflet; mildly thickened, mildly calcified   leaflets. Mild stenosis by morphologic appearance. There was   trivial regurgitation. - Mitral valve: There was mild to moderate regurgitation. - Right ventricle: The cavity size was severely dilated. Systolic   function was severely reduced.       ASSESSMENT:    1. Chronic combined systolic and diastolic congestive heart failure (HCC)   2. Paroxysmal atrial fibrillation (HCC)   3. Essential hypertension, benign   4. Pacemaker      PLAN:  In order of problems listed above:  Acute on chronic systolic and diastolic CHF EF 09%45% on echo 05/2018-weight 169 on our scales which is up 16 lbs since Oct. Diuretics increased last week without much improvement. I believe it's because of atrial fib with elevated HR. Will increase bisoprolol to 5 mg bid(it was decreased in the hospital during GI bleed). Her BP is running low so this could limit titration. I spoke with her daughter Wilkie AyeKristy and told her the patient may need to come to the hospital for further treatment given her fragile state, low BP and CHF. We'll check blood work today. If no improvement by Wed she will need to be hospitalized.Has f/u with Dr. Diona BrownerMcDowell Dec 9.  PAF was on Eliquis but developed GI bleed requiring transfusions and Eliquis on hold by GI-just restarted. Will check CBC  Essential hypertension BP running low  Pacemaker followed by Dr. Ladona Ridgelaylor    Medication Adjustments/Labs and Tests Ordered: Current medicines are reviewed at length with the patient today.  Concerns regarding medicines are outlined above.  Medication changes,  Labs and Tests ordered today are listed in the Patient Instructions below. Patient Instructions  Medication Instructions:  Increase Bisoprolol to 5 mg twice a day   If you need a refill on your cardiac medications before your next appointment, please call your pharmacy.   Lab work: BMET, CBC,BNP If you have labs (blood work) drawn today and your tests are completely normal, you will receive your results only by: Marland Kitchen. MyChart Message (if you have MyChart) OR . A paper copy in the mail If you have any lab test that is abnormal or we need to change your treatment, we will call you to review the results.  Testing/Procedures: none  Follow-Up: Keep apt 09/27/18 at  Any Other Special Instructions Will Be Listed Below (If Applicable). None      Signed, Jacolyn ReedyMichele Mauriana Dann, PA-C  09/06/2018 2:08 PM    [facility] [address], [place], [place]  [id number] Phone: [phone]; Fax: [phone]

## 2018-09-02 DIAGNOSIS — I5032 Chronic diastolic (congestive) heart failure: Secondary | ICD-10-CM | POA: Diagnosis not present

## 2018-09-02 DIAGNOSIS — I4891 Unspecified atrial fibrillation: Secondary | ICD-10-CM | POA: Diagnosis not present

## 2018-09-02 DIAGNOSIS — K2970 Gastritis, unspecified, without bleeding: Secondary | ICD-10-CM | POA: Diagnosis not present

## 2018-09-02 DIAGNOSIS — F410 Panic disorder [episodic paroxysmal anxiety] without agoraphobia: Secondary | ICD-10-CM | POA: Diagnosis not present

## 2018-09-02 DIAGNOSIS — E1169 Type 2 diabetes mellitus with other specified complication: Secondary | ICD-10-CM | POA: Diagnosis not present

## 2018-09-02 DIAGNOSIS — N390 Urinary tract infection, site not specified: Secondary | ICD-10-CM | POA: Diagnosis not present

## 2018-09-02 DIAGNOSIS — E782 Mixed hyperlipidemia: Secondary | ICD-10-CM | POA: Diagnosis not present

## 2018-09-02 DIAGNOSIS — D649 Anemia, unspecified: Secondary | ICD-10-CM | POA: Diagnosis not present

## 2018-09-02 DIAGNOSIS — R0602 Shortness of breath: Secondary | ICD-10-CM | POA: Diagnosis not present

## 2018-09-02 DIAGNOSIS — I10 Essential (primary) hypertension: Secondary | ICD-10-CM | POA: Diagnosis not present

## 2018-09-02 DIAGNOSIS — I509 Heart failure, unspecified: Secondary | ICD-10-CM | POA: Diagnosis not present

## 2018-09-02 DIAGNOSIS — J9601 Acute respiratory failure with hypoxia: Secondary | ICD-10-CM | POA: Diagnosis not present

## 2018-09-02 DIAGNOSIS — Z6826 Body mass index (BMI) 26.0-26.9, adult: Secondary | ICD-10-CM | POA: Diagnosis not present

## 2018-09-02 DIAGNOSIS — J90 Pleural effusion, not elsewhere classified: Secondary | ICD-10-CM | POA: Diagnosis not present

## 2018-09-02 DIAGNOSIS — J918 Pleural effusion in other conditions classified elsewhere: Secondary | ICD-10-CM | POA: Diagnosis not present

## 2018-09-02 DIAGNOSIS — J449 Chronic obstructive pulmonary disease, unspecified: Secondary | ICD-10-CM | POA: Diagnosis not present

## 2018-09-02 DIAGNOSIS — E119 Type 2 diabetes mellitus without complications: Secondary | ICD-10-CM | POA: Diagnosis not present

## 2018-09-02 DIAGNOSIS — Z8719 Personal history of other diseases of the digestive system: Secondary | ICD-10-CM | POA: Diagnosis not present

## 2018-09-02 DIAGNOSIS — R531 Weakness: Secondary | ICD-10-CM | POA: Diagnosis not present

## 2018-09-02 DIAGNOSIS — F331 Major depressive disorder, recurrent, moderate: Secondary | ICD-10-CM | POA: Diagnosis not present

## 2018-09-03 DIAGNOSIS — N183 Chronic kidney disease, stage 3 (moderate): Secondary | ICD-10-CM | POA: Diagnosis not present

## 2018-09-03 DIAGNOSIS — I2510 Atherosclerotic heart disease of native coronary artery without angina pectoris: Secondary | ICD-10-CM | POA: Diagnosis not present

## 2018-09-03 DIAGNOSIS — I130 Hypertensive heart and chronic kidney disease with heart failure and stage 1 through stage 4 chronic kidney disease, or unspecified chronic kidney disease: Secondary | ICD-10-CM | POA: Diagnosis not present

## 2018-09-03 DIAGNOSIS — I5043 Acute on chronic combined systolic (congestive) and diastolic (congestive) heart failure: Secondary | ICD-10-CM | POA: Diagnosis not present

## 2018-09-03 DIAGNOSIS — J449 Chronic obstructive pulmonary disease, unspecified: Secondary | ICD-10-CM | POA: Diagnosis not present

## 2018-09-03 DIAGNOSIS — E1122 Type 2 diabetes mellitus with diabetic chronic kidney disease: Secondary | ICD-10-CM | POA: Diagnosis not present

## 2018-09-06 ENCOUNTER — Other Ambulatory Visit: Payer: Medicare Other

## 2018-09-06 ENCOUNTER — Encounter: Payer: Self-pay | Admitting: Physician Assistant

## 2018-09-06 ENCOUNTER — Other Ambulatory Visit (HOSPITAL_COMMUNITY)
Admission: RE | Admit: 2018-09-06 | Discharge: 2018-09-06 | Disposition: A | Discharge status: Home / Self Care | Payer: Medicare Other | Source: Ambulatory Visit | Attending: Physician Assistant | Admitting: Physician Assistant

## 2018-09-06 ENCOUNTER — Ambulatory Visit (INDEPENDENT_AMBULATORY_CARE_PROVIDER_SITE_OTHER): Payer: Medicare Other | Admitting: Physician Assistant

## 2018-09-06 VITALS — BP 106/60 | HR 74 | Ht 64.0 in | Wt 169.0 lb

## 2018-09-06 DIAGNOSIS — I10 Essential (primary) hypertension: Secondary | ICD-10-CM | POA: Diagnosis not present

## 2018-09-06 DIAGNOSIS — I5042 Chronic combined systolic (congestive) and diastolic (congestive) heart failure: Secondary | ICD-10-CM | POA: Insufficient documentation

## 2018-09-06 DIAGNOSIS — I480 Paroxysmal atrial fibrillation: Secondary | ICD-10-CM | POA: Diagnosis not present

## 2018-09-06 DIAGNOSIS — Z950 Presence of cardiac pacemaker: Secondary | ICD-10-CM

## 2018-09-06 LAB — BASIC METABOLIC PANEL
Anion gap: 12 (ref 5–15)
BUN: 43 mg/dL — ABNORMAL HIGH (ref 8–23)
CALCIUM: 9.1 mg/dL (ref 8.9–10.3)
CO2: 34 mmol/L — AB (ref 22–32)
CREATININE: 1.87 mg/dL — AB (ref 0.44–1.00)
Chloride: 89 mmol/L — ABNORMAL LOW (ref 98–111)
GFR calc non Af Amer: 23 mL/min — ABNORMAL LOW (ref >60)
GFR, EST AFRICAN AMERICAN: 27 mL/min — AB (ref >60)
Glucose, Bld: 106 mg/dL — ABNORMAL HIGH (ref 70–99)
Potassium: 5.1 mmol/L (ref 3.5–5.1)
SODIUM: 135 mmol/L (ref 135–145)

## 2018-09-06 LAB — CBC
HEMATOCRIT: 35.5 % — AB (ref 36.0–46.0)
Hemoglobin: 9.5 g/dL — ABNORMAL LOW (ref 12.0–15.0)
MCH: 23.3 pg — AB (ref 26.0–34.0)
MCHC: 26.8 g/dL — AB (ref 30.0–36.0)
MCV: 87 fL (ref 80.0–100.0)
Platelets: 257 10*3/uL (ref 150–400)
RBC: 4.08 MIL/uL (ref 3.87–5.11)
RDW: 19.2 % — ABNORMAL HIGH (ref 11.5–15.5)
WBC: 7 10*3/uL (ref 4.0–10.5)
nRBC: 0 % (ref 0.0–0.2)

## 2018-09-06 LAB — BRAIN NATRIURETIC PEPTIDE: B Natriuretic Peptide: 697 pg/mL — ABNORMAL HIGH (ref 0.0–100.0)

## 2018-09-06 MED ORDER — APIXABAN 5 MG PO TABS: 5.0000 mg | ORAL_TABLET | Freq: Two times a day (BID) | ORAL | 6 refills | Status: AC

## 2018-09-06 MED ORDER — BISOPROLOL FUMARATE 5 MG PO TABS: 5.0000 mg | ORAL_TABLET | Freq: Every day | ORAL | 3 refills | Status: DC

## 2018-09-06 MED ORDER — BISOPROLOL FUMARATE 5 MG PO TABS: 5.0000 mg | ORAL_TABLET | Freq: Two times a day (BID) | ORAL | 3 refills | Status: AC

## 2018-09-06 MED ORDER — APIXABAN 2.5 MG PO TABS: 2.5000 mg | ORAL_TABLET | Freq: Two times a day (BID) | ORAL | 6 refills | Status: DC

## 2018-09-06 NOTE — Patient Instructions (Addendum)
Medication Instructions:  Increase Bisoprolol to 5 mg twice a day   If you need a refill on your cardiac medications before your next appointment, please call your pharmacy.   Lab work: BMET, CBC,BNP If you have labs (blood work) drawn today and your tests are completely normal, you will receive your results only by: Marland Kitchen. MyChart Message (if you have MyChart) OR . A paper copy in the mail If you have any lab test that is abnormal or we need to change your treatment, we will call you to review the results.  Testing/Procedures: none  Follow-Up: Keep apt 09/27/18 at 2:20 pm with Dr.[place]cdowell in [place] office  Any Other Special Instructions Will Be Listed Below (If Applicable). None

## 2018-09-07 ENCOUNTER — Inpatient Hospital Stay (HOSPITAL_COMMUNITY)
Admission: EM | Admit: 2018-09-07 | Discharge: 2018-09-19 | DRG: 291 | Disposition: E | Payer: Medicare Other | Attending: Cardiology | Admitting: Cardiology

## 2018-09-07 ENCOUNTER — Inpatient Hospital Stay (HOSPITAL_COMMUNITY): Payer: Medicare Other

## 2018-09-07 ENCOUNTER — Other Ambulatory Visit: Payer: Self-pay

## 2018-09-07 ENCOUNTER — Emergency Department (HOSPITAL_COMMUNITY): Payer: Medicare Other

## 2018-09-07 ENCOUNTER — Encounter (HOSPITAL_COMMUNITY): Payer: Self-pay | Admitting: Emergency Medicine

## 2018-09-07 ENCOUNTER — Telehealth: Payer: Self-pay

## 2018-09-07 DIAGNOSIS — N179 Acute kidney failure, unspecified: Secondary | ICD-10-CM | POA: Diagnosis present

## 2018-09-07 DIAGNOSIS — Z950 Presence of cardiac pacemaker: Secondary | ICD-10-CM

## 2018-09-07 DIAGNOSIS — I5082 Biventricular heart failure: Secondary | ICD-10-CM | POA: Diagnosis present

## 2018-09-07 DIAGNOSIS — I509 Heart failure, unspecified: Secondary | ICD-10-CM | POA: Diagnosis not present

## 2018-09-07 DIAGNOSIS — E785 Hyperlipidemia, unspecified: Secondary | ICD-10-CM | POA: Diagnosis present

## 2018-09-07 DIAGNOSIS — Z9981 Dependence on supplemental oxygen: Secondary | ICD-10-CM

## 2018-09-07 DIAGNOSIS — Z87891 Personal history of nicotine dependence: Secondary | ICD-10-CM

## 2018-09-07 DIAGNOSIS — I5043 Acute on chronic combined systolic (congestive) and diastolic (congestive) heart failure: Secondary | ICD-10-CM | POA: Diagnosis present

## 2018-09-07 DIAGNOSIS — I495 Sick sinus syndrome: Secondary | ICD-10-CM | POA: Diagnosis present

## 2018-09-07 DIAGNOSIS — Z7901 Long term (current) use of anticoagulants: Secondary | ICD-10-CM

## 2018-09-07 DIAGNOSIS — I50810 Right heart failure, unspecified: Secondary | ICD-10-CM | POA: Diagnosis not present

## 2018-09-07 DIAGNOSIS — J9612 Chronic respiratory failure with hypercapnia: Secondary | ICD-10-CM | POA: Diagnosis not present

## 2018-09-07 DIAGNOSIS — I959 Hypotension, unspecified: Secondary | ICD-10-CM | POA: Diagnosis present

## 2018-09-07 DIAGNOSIS — Z7189 Other specified counseling: Secondary | ICD-10-CM

## 2018-09-07 DIAGNOSIS — J449 Chronic obstructive pulmonary disease, unspecified: Secondary | ICD-10-CM | POA: Diagnosis not present

## 2018-09-07 DIAGNOSIS — I5020 Unspecified systolic (congestive) heart failure: Secondary | ICD-10-CM

## 2018-09-07 DIAGNOSIS — I255 Ischemic cardiomyopathy: Secondary | ICD-10-CM | POA: Diagnosis present

## 2018-09-07 DIAGNOSIS — J918 Pleural effusion in other conditions classified elsewhere: Secondary | ICD-10-CM | POA: Diagnosis present

## 2018-09-07 DIAGNOSIS — R091 Pleurisy: Secondary | ICD-10-CM | POA: Diagnosis not present

## 2018-09-07 DIAGNOSIS — I4891 Unspecified atrial fibrillation: Secondary | ICD-10-CM | POA: Diagnosis present

## 2018-09-07 DIAGNOSIS — J9611 Chronic respiratory failure with hypoxia: Secondary | ICD-10-CM | POA: Diagnosis not present

## 2018-09-07 DIAGNOSIS — I2510 Atherosclerotic heart disease of native coronary artery without angina pectoris: Secondary | ICD-10-CM | POA: Diagnosis present

## 2018-09-07 DIAGNOSIS — Z833 Family history of diabetes mellitus: Secondary | ICD-10-CM

## 2018-09-07 DIAGNOSIS — E875 Hyperkalemia: Secondary | ICD-10-CM | POA: Diagnosis present

## 2018-09-07 DIAGNOSIS — Z9049 Acquired absence of other specified parts of digestive tract: Secondary | ICD-10-CM

## 2018-09-07 DIAGNOSIS — J9621 Acute and chronic respiratory failure with hypoxia: Secondary | ICD-10-CM | POA: Diagnosis present

## 2018-09-07 DIAGNOSIS — Z955 Presence of coronary angioplasty implant and graft: Secondary | ICD-10-CM

## 2018-09-07 DIAGNOSIS — I252 Old myocardial infarction: Secondary | ICD-10-CM

## 2018-09-07 DIAGNOSIS — Z66 Do not resuscitate: Secondary | ICD-10-CM | POA: Diagnosis present

## 2018-09-07 DIAGNOSIS — I50813 Acute on chronic right heart failure: Secondary | ICD-10-CM | POA: Diagnosis not present

## 2018-09-07 DIAGNOSIS — Z515 Encounter for palliative care: Secondary | ICD-10-CM

## 2018-09-07 DIAGNOSIS — Z79899 Other long term (current) drug therapy: Secondary | ICD-10-CM

## 2018-09-07 DIAGNOSIS — D649 Anemia, unspecified: Secondary | ICD-10-CM | POA: Diagnosis present

## 2018-09-07 DIAGNOSIS — Z885 Allergy status to narcotic agent status: Secondary | ICD-10-CM

## 2018-09-07 DIAGNOSIS — J90 Pleural effusion, not elsewhere classified: Secondary | ICD-10-CM | POA: Diagnosis not present

## 2018-09-07 DIAGNOSIS — I340 Nonrheumatic mitral (valve) insufficiency: Secondary | ICD-10-CM

## 2018-09-07 DIAGNOSIS — Z452 Encounter for adjustment and management of vascular access device: Secondary | ICD-10-CM

## 2018-09-07 DIAGNOSIS — Z79890 Hormone replacement therapy (postmenopausal): Secondary | ICD-10-CM | POA: Diagnosis not present

## 2018-09-07 DIAGNOSIS — I4819 Other persistent atrial fibrillation: Secondary | ICD-10-CM | POA: Diagnosis not present

## 2018-09-07 DIAGNOSIS — Z9889 Other specified postprocedural states: Secondary | ICD-10-CM

## 2018-09-07 DIAGNOSIS — I110 Hypertensive heart disease with heart failure: Secondary | ICD-10-CM | POA: Diagnosis present

## 2018-09-07 DIAGNOSIS — I5022 Chronic systolic (congestive) heart failure: Secondary | ICD-10-CM | POA: Diagnosis not present

## 2018-09-07 DIAGNOSIS — I5023 Acute on chronic systolic (congestive) heart failure: Secondary | ICD-10-CM | POA: Diagnosis not present

## 2018-09-07 DIAGNOSIS — R0602 Shortness of breath: Secondary | ICD-10-CM | POA: Diagnosis not present

## 2018-09-07 DIAGNOSIS — E119 Type 2 diabetes mellitus without complications: Secondary | ICD-10-CM | POA: Diagnosis present

## 2018-09-07 DIAGNOSIS — Z888 Allergy status to other drugs, medicaments and biological substances status: Secondary | ICD-10-CM

## 2018-09-07 DIAGNOSIS — Z8249 Family history of ischemic heart disease and other diseases of the circulatory system: Secondary | ICD-10-CM

## 2018-09-07 LAB — BASIC METABOLIC PANEL
ANION GAP: 10 (ref 5–15)
Anion gap: 13 (ref 5–15)
BUN: 47 mg/dL — AB (ref 8–23)
BUN: 45 mg/dL — ABNORMAL HIGH (ref 8–23)
CALCIUM: 9 mg/dL (ref 8.9–10.3)
CHLORIDE: 89 mmol/L — AB (ref 98–111)
CO2: 33 mmol/L — AB (ref 22–32)
CO2: 35 mmol/L — ABNORMAL HIGH (ref 22–32)
CREATININE: 2.07 mg/dL — AB (ref 0.44–1.00)
Calcium: 8.9 mg/dL (ref 8.9–10.3)
Chloride: 89 mmol/L — ABNORMAL LOW (ref 98–111)
Creatinine, Ser: 2.11 mg/dL — ABNORMAL HIGH (ref 0.44–1.00)
GFR calc Af Amer: 24 mL/min — ABNORMAL LOW (ref >60)
GFR calc Af Amer: 23 mL/min — ABNORMAL LOW (ref >60)
GFR calc non Af Amer: 21 mL/min — ABNORMAL LOW (ref >60)
GFR calc non Af Amer: 20 mL/min — ABNORMAL LOW (ref >60)
GLUCOSE: 135 mg/dL — AB (ref 70–99)
GLUCOSE: 128 mg/dL — AB (ref 70–99)
POTASSIUM: 4.9 mmol/L (ref 3.5–5.1)
Potassium: 5.2 mmol/L — ABNORMAL HIGH (ref 3.5–5.1)
Sodium: 135 mmol/L (ref 135–145)
Sodium: 134 mmol/L — ABNORMAL LOW (ref 135–145)

## 2018-09-07 LAB — BODY FLUID CELL COUNT WITH DIFFERENTIAL
EOS FL: 0 %
Lymphs, Fluid: 71 %
Monocyte-Macrophage-Serous Fluid: 15 % — ABNORMAL LOW (ref 50–90)
Neutrophil Count, Fluid: 14 % (ref 0–25)
OTHER CELLS FL: 1 %
Total Nucleated Cell Count, Fluid: 113 cu mm (ref 0–1000)

## 2018-09-07 LAB — CBC WITH DIFFERENTIAL/PLATELET
Abs Immature Granulocytes: 0.04 10*3/uL (ref 0.00–0.07)
Basophils Absolute: 0 10*3/uL (ref 0.0–0.1)
Basophils Relative: 0 %
EOS ABS: 0 10*3/uL (ref 0.0–0.5)
EOS PCT: 0 %
HEMATOCRIT: 37.3 % (ref 36.0–46.0)
Hemoglobin: 9.7 g/dL — ABNORMAL LOW (ref 12.0–15.0)
Immature Granulocytes: 1 %
LYMPHS ABS: 0.6 10*3/uL — AB (ref 0.7–4.0)
Lymphocytes Relative: 9 %
MCH: 22.6 pg — ABNORMAL LOW (ref 26.0–34.0)
MCHC: 26 g/dL — AB (ref 30.0–36.0)
MCV: 86.7 fL (ref 80.0–100.0)
MONOS PCT: 13 %
Monocytes Absolute: 0.8 10*3/uL (ref 0.1–1.0)
Neutro Abs: 5 10*3/uL (ref 1.7–7.7)
Neutrophils Relative %: 77 %
PLATELETS: 271 10*3/uL (ref 150–400)
RBC: 4.3 MIL/uL (ref 3.87–5.11)
RDW: 19.4 % — AB (ref 11.5–15.5)
WBC: 6.4 10*3/uL (ref 4.0–10.5)
nRBC: 0 % (ref 0.0–0.2)

## 2018-09-07 LAB — BRAIN NATRIURETIC PEPTIDE: B Natriuretic Peptide: 759 pg/mL — ABNORMAL HIGH (ref 0.0–100.0)

## 2018-09-07 LAB — HEPATIC FUNCTION PANEL
ALT: 21 U/L (ref 0–44)
AST: 23 U/L (ref 15–41)
Albumin: 3.6 g/dL (ref 3.5–5.0)
Alkaline Phosphatase: 32 U/L — ABNORMAL LOW (ref 38–126)
BILIRUBIN INDIRECT: 0.5 mg/dL (ref 0.3–0.9)
BILIRUBIN TOTAL: 0.7 mg/dL (ref 0.3–1.2)
Bilirubin, Direct: 0.2 mg/dL (ref 0.0–0.2)
TOTAL PROTEIN: 5.7 g/dL — AB (ref 6.5–8.1)

## 2018-09-07 LAB — LACTATE DEHYDROGENASE, PLEURAL OR PERITONEAL FLUID: LD FL: 48 U/L — AB (ref 3–23)

## 2018-09-07 LAB — TROPONIN I
TROPONIN I: 0.03 ng/mL — AB (ref <0.03)
TROPONIN I: 0.03 ng/mL — AB (ref <0.03)
Troponin I: 0.03 ng/mL (ref <0.03)

## 2018-09-07 LAB — GRAM STAIN: Gram Stain: NONE SEEN

## 2018-09-07 LAB — ECHOCARDIOGRAM COMPLETE
HEIGHTINCHES: 64 in
WEIGHTICAEL: 2656 [oz_av]

## 2018-09-07 LAB — PROTEIN, PLEURAL OR PERITONEAL FLUID

## 2018-09-07 LAB — MRSA PCR SCREENING: MRSA by PCR: NEGATIVE

## 2018-09-07 MED ORDER — APIXABAN 2.5 MG PO TABS
2.5000 mg | ORAL_TABLET | Freq: Two times a day (BID) | ORAL | Status: DC
  Administered 2018-09-07: 2.5 mg via ORAL
  Filled 2018-09-07: qty 1

## 2018-09-07 MED ORDER — BISOPROLOL FUMARATE 5 MG PO TABS
5.0000 mg | ORAL_TABLET | Freq: Two times a day (BID) | ORAL | Status: DC
  Administered 2018-09-07: 5 mg via ORAL
  Filled 2018-09-07: qty 1

## 2018-09-07 MED ORDER — SODIUM CHLORIDE 0.9 % IV SOLN: 250.0000 mL | INTRAVENOUS | Status: DC | PRN

## 2018-09-07 MED ORDER — LORAZEPAM 0.5 MG PO TABS
0.5000 mg | ORAL_TABLET | Freq: Every day | ORAL | Status: DC
  Administered 2018-09-07 – 2018-09-13 (×7): 0.5 mg via ORAL
  Filled 2018-09-07 (×7): qty 1

## 2018-09-07 MED ORDER — IPRATROPIUM BROMIDE 0.02 % IN SOLN: 0.5000 mg | Freq: Four times a day (QID) | RESPIRATORY_TRACT | Status: DC | PRN

## 2018-09-07 MED ORDER — FUROSEMIDE 10 MG/ML IJ SOLN
60.0000 mg | Freq: Two times a day (BID) | INTRAMUSCULAR | Status: DC
  Administered 2018-09-07 – 2018-09-08 (×2): 60 mg via INTRAVENOUS
  Filled 2018-09-07 (×2): qty 6

## 2018-09-07 MED ORDER — ONDANSETRON HCL 4 MG/2ML IJ SOLN: 4.0000 mg | Freq: Four times a day (QID) | INTRAMUSCULAR | Status: DC | PRN

## 2018-09-07 MED ORDER — FERROUS SULFATE 325 (65 FE) MG PO TABS
325.0000 mg | ORAL_TABLET | Freq: Every day | ORAL | Status: DC
  Administered 2018-09-08: 325 mg via ORAL
  Filled 2018-09-07 (×2): qty 1

## 2018-09-07 MED ORDER — PANTOPRAZOLE SODIUM 40 MG PO TBEC
40.0000 mg | DELAYED_RELEASE_TABLET | Freq: Two times a day (BID) | ORAL | Status: DC
  Administered 2018-09-08 – 2018-09-09 (×3): 40 mg via ORAL
  Filled 2018-09-07 (×3): qty 1

## 2018-09-07 MED ORDER — LEVALBUTEROL HCL 0.63 MG/3ML IN NEBU: 0.6300 mg | INHALATION_SOLUTION | Freq: Four times a day (QID) | RESPIRATORY_TRACT | Status: DC | PRN

## 2018-09-07 MED ORDER — PANTOPRAZOLE SODIUM 20 MG PO TBEC
20.0000 mg | DELAYED_RELEASE_TABLET | Freq: Two times a day (BID) | ORAL | Status: DC
  Filled 2018-09-07 (×2): qty 1

## 2018-09-07 MED ORDER — SODIUM CHLORIDE 0.9% FLUSH
10.0000 mL | Freq: Two times a day (BID) | INTRAVENOUS | Status: DC
  Administered 2018-09-07 – 2018-09-10 (×4): 10 mL

## 2018-09-07 MED ORDER — SODIUM CHLORIDE 0.9% FLUSH: 3.0000 mL | INTRAVENOUS | Status: DC | PRN

## 2018-09-07 MED ORDER — CHLORHEXIDINE GLUCONATE CLOTH 2 % EX PADS
6.0000 | MEDICATED_PAD | Freq: Every day | CUTANEOUS | Status: DC
  Administered 2018-09-07 – 2018-09-11 (×2): 6 via TOPICAL

## 2018-09-07 MED ORDER — ESCITALOPRAM OXALATE 10 MG PO TABS
20.0000 mg | ORAL_TABLET | Freq: Every day | ORAL | Status: DC
  Administered 2018-09-08: 20 mg via ORAL
  Filled 2018-09-07: qty 2

## 2018-09-07 MED ORDER — VITAMIN B-12 1000 MCG PO TABS
1000.0000 ug | ORAL_TABLET | Freq: Every day | ORAL | Status: DC
  Administered 2018-09-08: 1000 ug via ORAL
  Filled 2018-09-07 (×2): qty 1

## 2018-09-07 MED ORDER — ACETAMINOPHEN 325 MG PO TABS
650.0000 mg | ORAL_TABLET | ORAL | Status: DC | PRN
  Administered 2018-09-08 (×2): 650 mg via ORAL
  Filled 2018-09-07 (×2): qty 2

## 2018-09-07 MED ORDER — FUROSEMIDE 10 MG/ML IJ SOLN
40.0000 mg | Freq: Once | INTRAMUSCULAR | Status: AC
  Administered 2018-09-07: 40 mg via INTRAVENOUS
  Filled 2018-09-07: qty 4

## 2018-09-07 MED ORDER — MELATONIN 10 MG PO TABS: 1.0000 | ORAL_TABLET | Freq: Every day | ORAL | Status: DC

## 2018-09-07 MED ORDER — SODIUM CHLORIDE 0.9% FLUSH: 10.0000 mL | INTRAVENOUS | Status: DC | PRN

## 2018-09-07 MED ORDER — NITROGLYCERIN 0.4 MG SL SUBL: 0.4000 mg | SUBLINGUAL_TABLET | SUBLINGUAL | Status: DC | PRN

## 2018-09-07 MED ORDER — PATIROMER SORBITEX CALCIUM 8.4 G PO PACK
8.4000 g | PACK | Freq: Once | ORAL | Status: DC
  Filled 2018-09-07: qty 1

## 2018-09-07 MED ORDER — PATIROMER SORBITEX CALCIUM 8.4 G PO PACK
8.4000 g | PACK | Freq: Every day | ORAL | Status: DC
  Administered 2018-09-08: 8.4 g via ORAL
  Filled 2018-09-07 (×3): qty 1

## 2018-09-07 MED ORDER — SODIUM CHLORIDE 0.9% FLUSH
3.0000 mL | Freq: Two times a day (BID) | INTRAVENOUS | Status: DC
  Administered 2018-09-07 – 2018-09-11 (×5): 3 mL via INTRAVENOUS

## 2018-09-07 NOTE — ED Provider Notes (Signed)
[facility] EMERGENCY DEPARTMENT Provider Note   CSN: [id number] Arrival date & time: September 19, 2018  1039     History   Chief Complaint Chief Complaint  Patient presents with  . Shortness of Breath    HPI [place] [name] is a 84 y.o. female.  Patient complains of some swelling in her legs gaining weight over 10 pounds and shortness of breath.  She was seen by cardiology yesterday and they recommended she come back to the hospital if she continues to get worse  The history is provided by the patient. No language interpreter was used.  Shortness of Breath  This is a recurrent problem. The problem occurs continuously.The current episode started more than 2 days ago. The problem has not changed since onset.Pertinent negatives include no fever, no headaches, no cough, no chest pain, no abdominal pain and no rash. Precipitated by: Unknown. Risk factors: Congestive heart failure. Treatments tried: Diuretics. She has had prior hospitalizations. She has had prior ED visits. She has had prior ICU admissions. Associated medical issues do not include asthma.    Past Medical History:  Diagnosis Date  . Asthma   . COPD (chronic obstructive pulmonary disease) (HCC)    Oxygen dependent  . Coronary atherosclerosis of native coronary artery   . Essential hypertension, benign   . History of GI bleed    Ulcer  . Hyperlipidemia   . Old inferior wall myocardial infarction 1999   [facility]  . PAF (paroxysmal atrial fibrillation) (HCC)   . Sinus node dysfunction (HCC)    [employer] PPM  . Type 2 diabetes mellitus [facility])     Patient Active Problem List   Diagnosis Date Noted  . Pacemaker 09/01/2018  . Chronic systolic CHF (congestive heart failure) (HCC) 07/31/2018  . UGIB (upper gastrointestinal bleed) 07/30/2018  . Pulmonary actinomycosis (HCC) 05/25/2018  . Empyema, left (HCC)   . Pressure injury of skin 05/23/2018  . Acute respiratory failure with hypoxia and hypercarbia (HCC) 05/23/2018  .  Acute metabolic encephalopathy 05/23/2018  . Lobar pneumonia (HCC) 05/23/2018  . Acute on chronic combined systolic and diastolic CHF (congestive heart failure) (HCC) 05/21/2018  . Chronic respiratory failure with hypoxia (HCC) 05/21/2018  . UTI (urinary tract infection) 06/25/2016  . Symptomatic bradycardia 06/21/2016  . Digoxin toxicity 06/21/2016  . Chronic respiratory failure with hypoxia and hypercapnia (HCC) 06/13/2016  . Diarrhea 04/07/2016  . Anemia 04/07/2016  . Localized edema 04/07/2016  . Combined congestive systolic and diastolic heart failure (HCC) 03/20/2016  . Palliative care encounter   . Goals of care, counseling/discussion   . Dyspnea   . Atrial fibrillation with RVR (HCC)   . Pleural effusion on left 03/09/2016  . Atrial fibrillation (HCC) 03/09/2016  . Hyperlipidemia   . Type 2 diabetes mellitus (HCC) 12/26/2009  . HYPERLIPIDEMIA 12/26/2009  . Essential hypertension, benign 12/26/2009  . Coronary atherosclerosis of native coronary artery 12/26/2009  . COPD (chronic obstructive pulmonary disease) (HCC) 12/26/2009    Past Surgical History:  Procedure Laterality Date  . BIOPSY  08/02/2018   Procedure: BIOPSY;  Surgeon: Corbin Ade, MD;  Location: [facility] ENDO SUITE;  Service: Endoscopy;;  gastric  . Cesareen section    . CHOLECYSTECTOMY    . COLONOSCOPY  09/2004   RMR: For obscure GI bleed. Left-sided diverticula, otherwise normal ileocolonoscopy  . Coronary artery stent  1999   BMS x 3 RCA  ([facility]), PTCRA RC4  . EP IMPLANTABLE DEVICE N/A 06/26/2016   Procedure: Pacemaker Implant;  Surgeon:  Will Jorja LoaMartin Camnitz, MD;  Location: [facility] INVASIVE CV LAB;  Service: Cardiovascular;  Laterality: N/A;  . ESOPHAGOGASTRODUODENOSCOPY  11 2005   RMR: For melena, normal exam.  . ESOPHAGOGASTRODUODENOSCOPY (EGD) WITH PROPOFOL N/A 08/02/2018   Procedure: ESOPHAGOGASTRODUODENOSCOPY (EGD) WITH PROPOFOL;  Surgeon: Corbin Adeourk, Robert M, MD;  Location: [facility] ENDO SUITE;  Service: Endoscopy;   Laterality: N/A;  . EXPLORATORY LAPAROTOMY    . GIVENS CAPSULE STUDY  08/2004   official report unavailable, but reported couple of AVMs, nonbleeding  . IR PERC PLEURAL DRAIN W/INDWELL CATH W/IMG GUIDE  05/27/2018  . PARTIAL COLECTOMY  1977   blockage/gangrene? unclear if small bowel or colon  . THORACENTESIS Left 03/10/16   1 L transudative fluid     OB History   None      Home Medications    Prior to Admission medications   Medication Sig Start Date End Date Taking? Authorizing Provider  apixaban (ELIQUIS) 5 MG TABS tablet Take 1 tablet (5 mg total) by mouth 2 (two) times daily. 09/06/18  Yes Dyann KiefLenze, Michele M, PA-C  bisoprolol (ZEBETA) 5 MG tablet Take 1 tablet (5 mg total) by mouth 2 (two) times daily. 09/06/18  Yes Dyann KiefLenze, Michele M, PA-C  ciprofloxacin (CIPRO) 500 MG tablet Take 500 mg by mouth 2 (two) times daily.   Yes [provider]  escitalopram (LEXAPRO) 20 MG tablet Take 20 mg by mouth at bedtime.    Yes [provider]  ferrous sulfate 325 (65 FE) MG EC tablet Take 325 mg by mouth daily.   Yes [provider]  ipratropium-albuterol (DUONEB) 0.5-2.5 (3) MG/3ML SOLN Take 3 mLs by nebulization 3 (three) times daily.   Yes [provider]  LORazepam (ATIVAN) 0.5 MG tablet Take 1 tablet (0.5 mg total) by mouth at bedtime. Take one tablet by mouth once daily 06/14/18  Yes Lassen, Arlo C, PA-C  Melatonin 10 MG TABS Take 1 tablet by mouth at bedtime.   Yes [provider]  Multiple Vitamins-Minerals (CENTRUM SILVER 50+WOMEN PO) Take 1 tablet by mouth daily.   Yes [provider]  nitroGLYCERIN (NITROSTAT) 0.4 MG SL tablet Place 1 tablet (0.4 mg total) under the tongue every 5 (five) minutes as needed for chest pain. 04/24/14  Yes Jonelle SidleMcDowell, Samuel G, MD  pantoprazole (PROTONIX) 20 MG tablet Take 20 mg by mouth 2 (two) times daily.    Yes [provider]  potassium chloride (K-DUR,KLOR-CON) 10 MEQ tablet Take 10 mEq by mouth 2  (two) times daily.   Yes [provider]  torsemide (DEMADEX) 20 MG tablet Take 20 mg by mouth daily. Pt taking 40 mg in am & 20 mg pm   Yes [provider]  vitamin B-12 (CYANOCOBALAMIN) 1000 MCG tablet Take 1,000 mcg by mouth daily.   Yes [provider]    Family History Family History  Problem Relation Age of Onset  . Diabetes Mother   . Diabetes Father   . Heart disease Father   . Cancer Sister        unknown  . Cancer Brother        unknown  . Diabetes Brother   . Colon cancer Neg Hx     Social History Social History   Tobacco Use  . Smoking status: Former Smoker    Packs/day: 2.00    Years: 40.00    Pack years: 80.00    Types: Cigarettes    Last attempt to quit: 01/13/1998    Years since  quitting: 20.6  . Smokeless tobacco: Never Used  Substance Use Topics  . Alcohol use: No    Alcohol/week: 0.0 standard drinks  . Drug use: No     Allergies   Codeine and Heparin   Review of Systems Review of Systems  Constitutional: Negative for appetite change, fatigue and fever.  HENT: Negative for congestion, ear discharge and sinus pressure.   Eyes: Negative for discharge.  Respiratory: Positive for shortness of breath. Negative for cough.   Cardiovascular: Negative for chest pain.  Gastrointestinal: Negative for abdominal pain and diarrhea.  Genitourinary: Negative for frequency and hematuria.  Musculoskeletal: Negative for back pain.       Swelling in legs  Skin: Negative for rash.  Neurological: Negative for seizures and headaches.  Psychiatric/Behavioral: Negative for hallucinations.     Physical Exam Updated Vital Signs BP 103/68   Pulse (!) 110   Temp (!) 96.7 F (35.9 C) (Temporal)   Resp (!) 25   Ht 5\' 4"  (1.626 m)   Wt 75.3 kg   SpO2 91%   BMI 28.49 kg/m   Physical Exam  Constitutional: She is oriented to person, place, and time. She appears well-developed.  HENT:  Head: Normocephalic.  Eyes: Conjunctivae and EOM  are normal. No scleral icterus.  Neck: Neck supple. No thyromegaly present.  Cardiovascular: Normal rate and regular rhythm. Exam reveals no gallop and no friction rub.  No murmur heard. Pulmonary/Chest: No stridor. She has no wheezes. She has no rales. She exhibits no tenderness.  Abdominal: She exhibits no distension. There is no tenderness. There is no rebound.  Musculoskeletal: Normal range of motion. She exhibits no edema.  3+ edema all the way up her legs  Lymphadenopathy:    She has no cervical adenopathy.  Neurological: She is oriented to person, place, and time. She exhibits normal muscle tone. Coordination normal.  Skin: No rash noted. No erythema.  Psychiatric: She has a normal mood and affect. Her behavior is normal.     ED Treatments / Results  Labs (all labs ordered are listed, but only abnormal results are displayed) Labs Reviewed  BASIC METABOLIC PANEL - Abnormal; Notable for the following components:      Result Value   Potassium 5.2 (*)    Chloride 89 (*)    CO2 33 (*)    Glucose, Bld 135 (*)    BUN 47 (*)    Creatinine, Ser 2.07 (*)    GFR calc non Af Amer 21 (*)    GFR calc Af Amer 24 (*)    All other components within normal limits  CBC WITH DIFFERENTIAL/PLATELET - Abnormal; Notable for the following components:   Hemoglobin 9.7 (*)    MCH 22.6 (*)    MCHC 26.0 (*)    RDW 19.4 (*)    Lymphs Abs 0.6 (*)    All other components within normal limits  BRAIN NATRIURETIC PEPTIDE - Abnormal; Notable for the following components:   B Natriuretic Peptide 759.0 (*)    All other components within normal limits  HEPATIC FUNCTION PANEL - Abnormal; Notable for the following components:   Total Protein 5.7 (*)    Alkaline Phosphatase 32 (*)    All other components within normal limits  TROPONIN I - Abnormal; Notable for the following components:   Troponin I 0.03 (*)    All other components within normal limits    EKG EKG Interpretation  Date/Time:  Tuesday  September 07 2018 11:02:13 EST Ventricular Rate:  109 PR Interval:    QRS Duration: 145 QT Interval:  385 QTC Calculation: 519 R Axis:   122 Text Interpretation:  Atrial fibrillation Paired ventricular premature complexes Right bundle branch block Confirmed by Bethann Berkshire [phone]) on 08/24/2018 11:15:19 AM Also confirmed by Bethann Berkshire [phone])  on 08/28/2018 12:40:20 PM   Radiology Dg Chest 2 View  Result Date: 08/30/2018 CLINICAL DATA:  Shortness of breath. EXAM: CHEST - 2 VIEW COMPARISON:  Radiographs of July 30, 2018. FINDINGS: Stable cardiomegaly. Left-sided pacemaker is unchanged in position. No pneumothorax is noted. Large probably loculated left pleural effusion is noted with associated atelectasis. Right lung is unremarkable. Bony thorax is unremarkable. IMPRESSION: Large probable loculated left pleural effusion is noted which is increased in size compared to prior exam Aortic Atherosclerosis (ICD10-I70.0). Electronically Signed   By: Lupita Raider, M.D.   On: 08/24/2018 12:04    Procedures Procedures (including critical care time)  Medications Ordered in ED Medications  furosemide (LASIX) injection 40 mg (40 mg Intravenous Given 08/25/2018 1201)     Initial Impression / Assessment and Plan / ED Course  I have reviewed the triage vital signs and the nursing notes.  Pertinent labs & imaging results that were available during my care of the patient were reviewed by me and considered in my medical decision making (see chart for details).     Patient with worsening congestive heart failure.  I spoke with cardiology and they recommended admission to medicine with diuresis and cardiology will consult  Final Clinical Impressions(s) / ED Diagnoses   Final diagnoses:  Systolic congestive heart failure, unspecified HF chronicity [facility])    ED Discharge Orders    None       Bethann Berkshire, MD 08/29/2018 1322

## 2018-09-07 NOTE — ED Notes (Signed)
Pt reports weight gain of 1+ lb since yesterday.

## 2018-09-07 NOTE — ED Triage Notes (Signed)
Pt c/o of sob x 3 days with fluid retention.  Saw pcp and got lab work and was told to come to ER to be evaluated.  Pt wears 2 L [place] continuously.  BP 89/48

## 2018-09-07 NOTE — Telephone Encounter (Signed)
-----   Message from Dyann KiefMichele M Lenze, PA-C sent at September 25, 2018  7:52 AM EST ----- Heart failure marker elevated as expected. Kidney function up as well. Anemia is better. If not doing any better by tomorrow will need to come to the hospital.

## 2018-09-07 NOTE — Progress Notes (Signed)
Thoracentesis complete no signs of distress.  

## 2018-09-07 NOTE — Progress Notes (Signed)
Nurse made aware there was no VTE prophylaxis ordered by MD. Dr. Jarvis NewcomerGrunz made aware and gave verbal order for Eliquis 2.5mg  PO BID.

## 2018-09-07 NOTE — Progress Notes (Signed)
CRITICAL VALUE ALERT  Critical Value:  Troponin 0.03  Date & Time Notied:  11/18/2017 at 1850  Provider Notified: Dr. Jarvis NewcomerGrunz  Orders Received/Actions taken: No new orders

## 2018-09-07 NOTE — Telephone Encounter (Signed)
Spoke with pt, she could not hear well. Called daughter and left voicemail.

## 2018-09-07 NOTE — Progress Notes (Addendum)
Echo read, stable LV function 40-45% cannot eval diastolic function due to afib. Massively dilated RV with severe dysfunction, this looks to be the primary issue with her HF. Suspect RV dysfunction related to her O2 depending COPD as well as her left sided heart disease.  Recommend central access to check venous sats (we also may run into some low bp's with attempts at diruesis), follow output with IV lasix, if not significnat diureses overnight would plan for milrinone and [facility] tomorrow. Lower her bisoprolol back to 5mg  daily (was increased yesterday) given her poor RV function, may end up coming off pending her bp's and venous sats. If cannot tolerate beta blocker would start amio gtt.     Dina RichJonathan Juleah Paradise MD

## 2018-09-07 NOTE — Progress Notes (Signed)
Peripherally Inserted Central Catheter/Midline Placement  The IV Nurse has discussed with the patient and/or persons authorized to consent for the patient, the purpose of this procedure and the potential benefits and risks involved with this procedure.  The benefits include less needle sticks, lab draws from the catheter, and the patient may be discharged home with the catheter. Risks include, but not limited to, infection, bleeding, blood clot (thrombus formation), and puncture of an artery; nerve damage and irregular heartbeat and possibility to perform a PICC exchange if needed/ordered by physician.  Alternatives to this procedure were also discussed.  B[employer] Power PICC patient education guide, fact sheet on infection prevention and patient information card has been provided to patient /or left at bedside.    PICC/Midline Placement Documentation  PICC Double Lumen 03-Apr-2018 PICC Right Brachial 35 cm 0 cm (Active)  Exposed Catheter (cm) 0 cm 01-27-18  7:16 PM  Site Assessment Clean;Dry;Intact 01-27-18  7:16 PM  Lumen #1 Status Flushed;Saline locked;Blood return noted 01-27-18  7:16 PM  Lumen #2 Status Flushed;Saline locked;Blood return noted 01-27-18  7:16 PM  Dressing Type Transparent 01-27-18  7:16 PM  Dressing Status Clean;Dry;Intact 01-27-18  7:16 PM  Dressing Intervention New dressing 01-27-18  7:16 PM  Dressing Change Due 09/09/2018 01-27-18  7:16 PM       Michall Noffke, Lajean ManesKerry Loraine 01-27-18, 7:16 PM

## 2018-09-07 NOTE — ED Notes (Signed)
Pt returned from Ultrasound and states her breathing feels better. Pt hooked back to purewick suction canister. Minimal amount of urine returned. Pt is still off loading her backside with pillow wedge.

## 2018-09-07 NOTE — H&P (Addendum)
History and Physical   [name] ZOX:[id number] DOB: 1930-11-20 DOA: 09/13/2018  Referring MD/NP/PA: EDP, Dr. Estell Harpin PCP: Benita Stabile, MD Outpatient Specialists: Cardiology, Dr. Diona Browner  Patient coming from: Home  Chief Complaint: Shortness of breath, weight gain  HPI: [name] is a 84 y.o. female with a history of chronic combined CHF, PAF on eliquis, sinus node dysfunction s/p PPM, left empyema s/p pigtail catheter and abx, and chronic respiratory failure due to COPD who presented with weight gain and dyspnea. Shortness of breath has been constant, moderate, severe with attempts at exertion, improved with resting, and associated with weight gain and leg swelling, all worsening over the previous several weeks. She had been taking an increased dose of torsemide (40mg  qAM and 20mg  qPM) but had still gained about 16 lbs from 10/2 when she was at her cardiologist's office the previous day. Labs were drawn and bisoprolol was increased with the suspicion of tachycardia-mediated worsening of CHF. She was told to seek medical advice if weight gain continued or symptoms worsened, so she came to the ED after gaining a pound and continuing to feel very short of breath.   ED Course: Found to be dyspneic, afebrile, with AFib w/RVR, no ischemic ECG changes. +JVD and pitting LE edema, CXR showing left pleural effusion, possibly loculated. BNP 759, creatinine 2.07 from baseline of 0.9, K 5.2. Cardiology was consulted, recommended medical admission.  Review of Systems: No fever, cough, sputum, chest pain. Sleeps on 3 pillows, but not recent change. No PND. Does not think urine output increased with increase in torsemide, and per HPI. All others reviewed and are negative.   Past Medical History:  Diagnosis Date  . Asthma   . COPD (chronic obstructive pulmonary disease) (HCC)    Oxygen dependent  . Coronary atherosclerosis of native coronary artery   . Essential hypertension, benign   .  History of GI bleed    Ulcer  . Hyperlipidemia   . Old inferior wall myocardial infarction 1999   [facility]  . PAF (paroxysmal atrial fibrillation) (HCC)   . Sinus node dysfunction (HCC)    Medtronic PPM  . Type 2 diabetes mellitus (HCC)    Past Surgical History:  Procedure Laterality Date  . BIOPSY  08/02/2018   Procedure: BIOPSY;  Surgeon: Corbin Ade, MD;  Location: [facility];  Service: Endoscopy;;  gastric  . Cesareen section    . CHOLECYSTECTOMY    . COLONOSCOPY  09/2004   RMR: For obscure GI bleed. Left-sided diverticula, otherwise normal ileocolonoscopy  . Coronary artery stent  1999   BMS x 3 RCA  ([facility]), PTCRA RC4  . EP IMPLANTABLE DEVICE N/A 06/26/2016   Procedure: Pacemaker Implant;  Surgeon: Will Jorja Loa, MD;  Location: [facility];  Service: Cardiovascular;  Laterality: N/A;  . ESOPHAGOGASTRODUODENOSCOPY  11 2005   RMR: For melena, normal exam.  . ESOPHAGOGASTRODUODENOSCOPY (EGD) WITH PROPOFOL N/A 08/02/2018   Procedure: ESOPHAGOGASTRODUODENOSCOPY (EGD) WITH PROPOFOL;  Surgeon: Corbin Ade, MD;  Location: [facility];  Service: Endoscopy;  Laterality: N/A;  . EXPLORATORY LAPAROTOMY    . GIVENS CAPSULE STUDY  08/2004   official report unavailable, but reported couple of AVMs, nonbleeding  . IR PERC PLEURAL DRAIN W/INDWELL CATH W/IMG GUIDE  05/27/2018  . PARTIAL COLECTOMY  1977   blockage/gangrene? unclear if small bowel or colon  . THORACENTESIS Left 03/10/16   1 L transudative fluid   - Former smoker, no EtOH or illicit drugs.  reports that she quit smoking about 20 years ago. Her smoking use included cigarettes. She has a 80.00 pack-year smoking history. She has never used smokeless tobacco. She reports that she does not drink alcohol or use drugs. Allergies  Allergen Reactions  . Codeine Nausea Only  . Heparin Other (See Comments)    Causes internal bleeding    Family History  Problem Relation Age of Onset  . Diabetes Mother   .  Diabetes Father   . Heart disease Father   . Cancer Sister        unknown  . Cancer Brother        unknown  . Diabetes Brother   . Colon cancer Neg Hx    - Family history otherwise reviewed and not pertinent.  Prior to Admission medications   Medication Sig Start Date End Date Taking? Authorizing Provider  apixaban (ELIQUIS) 5 MG TABS tablet Take 1 tablet (5 mg total) by mouth 2 (two) times daily. 09/06/18  Yes Dyann Kief, PA-C  bisoprolol (ZEBETA) 5 MG tablet Take 1 tablet (5 mg total) by mouth 2 (two) times daily. 09/06/18  Yes Dyann Kief, PA-C  ciprofloxacin (CIPRO) 500 MG tablet Take 500 mg by mouth 2 (two) times daily.   Yes [provider]  escitalopram (LEXAPRO) 20 MG tablet Take 20 mg by mouth at bedtime.    Yes [provider]  ferrous sulfate 325 (65 FE) MG EC tablet Take 325 mg by mouth daily.   Yes [provider]  ipratropium-albuterol (DUONEB) 0.5-2.5 (3) MG/3ML SOLN Take 3 mLs by nebulization 3 (three) times daily.   Yes [provider]  LORazepam (ATIVAN) 0.5 MG tablet Take 1 tablet (0.5 mg total) by mouth at bedtime. Take one tablet by mouth once daily 06/14/18  Yes Lassen, Arlo C, PA-C  Melatonin 10 MG TABS Take 1 tablet by mouth at bedtime.   Yes [provider]  Multiple Vitamins-Minerals (CENTRUM SILVER 50+WOMEN PO) Take 1 tablet by mouth daily.   Yes [provider]  nitroGLYCERIN (NITROSTAT) 0.4 MG SL tablet Place 1 tablet (0.4 mg total) under the tongue every 5 (five) minutes as needed for chest pain. 04/24/14  Yes Jonelle Sidle, MD  pantoprazole (PROTONIX) 20 MG tablet Take 20 mg by mouth 2 (two) times daily.    Yes [provider]  potassium chloride (K-DUR,KLOR-CON) 10 MEQ tablet Take 10 mEq by mouth 2 (two) times daily.   Yes [provider]  torsemide (DEMADEX) 20 MG tablet Take 20 mg by mouth daily. Pt taking 40 mg in am & 20 mg pm   Yes [provider]  vitamin B-12  (CYANOCOBALAMIN) 1000 MCG tablet Take 1,000 mcg by mouth daily.   Yes [provider]    Physical Exam: Vitals:   2018/09/19 1100 09/19/2018 1112 09-19-18 1130 09-19-18 1200  BP: 103/70  (!) 100/57 103/68  Pulse:      Resp: 20  (!) 22 (!) 25  Temp:      TempSrc:      SpO2:  91%    Weight:      Height:       Constitutional: Frail elderly female in no distress, calm demeanor Eyes: Lids and conjunctivae normal, PERRL ENMT: Mucous membranes are moist. Posterior pharynx clear of any exudate or lesions. Poor dentition.  Neck: normal, supple, no masses, no thyromegaly Respiratory: Non-labored tachypnea without accessory muscle use on supplemental oxygen. Diminished diffusely without wheezing or crackles. Cardiovascular: Irregular  tachycardia, no murmurs, rubs, or gallops. No carotid bruits. + JVD. 2+ pitting dependent LE edema. + pedal pulses. Abdomen: Normoactive bowel sounds. No tenderness, non-distended, and no masses palpated. No hepatosplenomegaly. GU: No indwelling catheter Musculoskeletal: No clubbing / cyanosis. No joint deformity upper and lower extremities. Good ROM, no contractures. Normal muscle tone.  Skin: Warm, dry. No rashes, wounds, or ulcers. No significant lesions noted.  Neurologic: CN II-XII grossly intact. Gait not assessed. Speech normal. No focal deficits in motor strength or sensation in all extremities.  Psychiatric: Alert and oriented x3. Normal judgment and insight. Mood depressed with reserved affect.   Labs on Admission: I have personally reviewed following labs and imaging studies  CBC: Recent Labs  Lab 09/06/18 1427 09/16/2018 1119  WBC 7.0 6.4  NEUTROABS  --  5.0  HGB 9.5* 9.7*  HCT 35.5* 37.3  MCV 87.0 86.7  PLT 257 271   Basic Metabolic Panel: Recent Labs  Lab 09/06/18 1427 09/03/2018 1119  NA 135 135  K 5.1 5.2*  CL 89* 89*  CO2 34* 33*  GLUCOSE 106* 135*  BUN 43* 47*  CREATININE 1.87* 2.07*  CALCIUM 9.1 9.0   GFR: Estimated  Creatinine Clearance: 19.7 mL/min (A) (by C-G formula based on SCr of 2.07 mg/dL (H)). Liver Function Tests: Recent Labs  Lab 09/08/2018 1119  AST 23  ALT 21  ALKPHOS 32*  BILITOT 0.7  PROT 5.7*  ALBUMIN 3.6   No results for input(s): LIPASE, AMYLASE in the last 168 hours. No results for input(s): AMMONIA in the last 168 hours. Coagulation Profile: No results for input(s): INR, PROTIME in the last 168 hours. Cardiac Enzymes: Recent Labs  Lab 08/21/2018 1119  TROPONINI 0.03*   BNP (last 3 results) No results for input(s): PROBNP in the last 8760 hours. HbA1C: No results for input(s): HGBA1C in the last 72 hours. CBG: No results for input(s): GLUCAP in the last 168 hours. Lipid Profile: No results for input(s): CHOL, HDL, LDLCALC, TRIG, CHOLHDL, LDLDIRECT in the last 72 hours. Thyroid Function Tests: No results for input(s): TSH, T4TOTAL, FREET4, T3FREE, THYROIDAB in the last 72 hours. Anemia Panel: No results for input(s): VITAMINB12, FOLATE, FERRITIN, TIBC, IRON, RETICCTPCT in the last 72 hours. Urine analysis:    Component Value Date/Time   COLORURINE YELLOW 05/23/2018 1250   APPEARANCEUR HAZY (A) 05/23/2018 1250   LABSPEC 1.018 05/23/2018 1250   PHURINE 5.0 05/23/2018 1250   GLUCOSEU NEGATIVE 05/23/2018 1250   HGBUR NEGATIVE 05/23/2018 1250   BILIRUBINUR NEGATIVE 05/23/2018 1250   KETONESUR 5 (A) 05/23/2018 1250   PROTEINUR 30 (A) 05/23/2018 1250   NITRITE NEGATIVE 05/23/2018 1250   LEUKOCYTESUR NEGATIVE 05/23/2018 1250    No results found for this or any previous visit (from the past 240 hour(s)).   Radiological Exams on Admission: Dg Chest 2 View  Result Date: 08/27/2018 CLINICAL DATA:  Shortness of breath. EXAM: CHEST - 2 VIEW COMPARISON:  Radiographs of July 30, 2018. FINDINGS: Stable cardiomegaly. Left-sided pacemaker is unchanged in position. No pneumothorax is noted. Large probably loculated left pleural effusion is noted with associated atelectasis.  Right lung is unremarkable. Bony thorax is unremarkable. IMPRESSION: Large probable loculated left pleural effusion is noted which is increased in size compared to prior exam Aortic Atherosclerosis (ICD10-I70.0). Electronically Signed   By: Lupita RaiderJames  Green Jr, M.D.   On: 08/28/2018 12:04    EKG: Independently reviewed. AFib with baseline wander, vent rate 109, RBBB.   Assessment/Plan Active Problems:   *  No active hospital problems. *   Acute on chronic combined CHF, RV failure: - Lasix 40mg  IV not expected to meet threshold for diuresis if torsemide 40 did not increase UOP. Cardiology recommends increasing to 60mg  IV BID, ordered.  - Monitor I/O, daily weights, HF pathway - Repeat echocardiogram - Place PICC for central sats and anticipated need for inotropes per cardiology. D/w nephrology, Dr. Kristian Covey, who agrees PICC is ok.  Hyperkalemic acute kidney injury: Suspect this is hemodynamically mediated due to worsening CHF/RV failure.  - Attempt diuresis, monitor renal function closely.  - Give patiromer x1, loop diuretic as above and monitor. Mild elevation of K.  - If worsening, may require formal nephrology consultation.  Left pleural effusion, history of empyema: Possibly loculated based on CXR appearance. Thora during admission on 8/2 culture revealed CoNS, actinomyces for which she completed a course of vancomycin, CTX and ultimately doxycycline. Pigtail catheter was placed and subsequently removed.  - U/S thoracentesis, 800cc obtained, studies ordered. With no attributable symptoms to PNA, will hold off on abx for now pending studies.   Persistent AFib: Unclear if elevated rates are causing HF decompensation or vice versa.  - No CCB with LV dysfunction, no digoxin w/renal function, age. On bisoprolol PTA which will be continued. Cardiology considering amiodarone gtt.   Chronic hypoxic respiratory failure, COPD: No wheezing to suggest benefit of steroids at this time. - Continue  supplemental oxygen to maintain saturations 88-95%. AVOID over-oxygenation as she has a history of CO2 retention. Can give prn BiPAP. - Prn ipratropium-xopenex to minimize HR elevation. - Continue cardioselective BB for now.  Sinus node dysfunction s/p PPM: Normal function on interrogation Oct 2019.   DVT prophylaxis: Eliquis  Code Status: Extensive discussion with patient and her daughter at admission. Pt expresses wishes consistent with DNR. We've decided to make her DNR and have her continue discussions with family. She and daughter agree.  Family Communication: Daughter at bedside Disposition Plan: Admit to SDU, guarded prognosis Consults called: Cardiology, Dr. Wyline Mood. Discussed with, but not formally consulted, Dr. Kristian Covey.  Admission status: Inpatient  The appropriate admission status for this patient is INPATIENT. Inpatient status is judged to be reasonable and necessary in order to provide the required intensity of service to ensure the patient's safety. The patient's presenting symptoms, physical exam findings, and initial radiographic and laboratory data in the context of their chronic comorbidities is felt to place them at high risk for further clinical deterioration. Furthermore, it is not anticipated that the patient will be medically stable for discharge from the hospital within 2 midnights of admission. The following factors support the admission status of inpatient.    The patient's presenting symptoms include dyspnea, leg swelling, weight gain.  The worrisome physical exam findings include JVD, peripheral edema, tachypnea, irregular tachycardia, decreased breath sounds.  The initial radiographic and laboratory data are worrisome because of L pleural effusion, renal failure.  The chronic co-morbidities include chronic hypoxic respiratory failure, AFib, CHF, and advanced age among others.  Patient requires inpatient status due to high intensity of service, high risk for  further deterioration and high frequency of surveillance required.  I certify that at the point of admission it is my clinical judgment that the patient will require inpatient hospital care spanning beyond 2 midnights from the point of admission.      Tyrone Nine, MD [facility] [email or web address] Password [email or web address] 10/03/18, 1:37 PM

## 2018-09-07 NOTE — Progress Notes (Signed)
*  PRELIMINARY RESULTS* Echocardiogram 2D Echocardiogram has been performed.  Stacey DrainWhite, Dakiyah Heinke J 09/05/2018, 3:38 PM

## 2018-09-07 NOTE — Consult Note (Signed)
Cardiology Consultation:   Patient ID: [name] MRN: [id number]; DOB: 02/27/1931  Admit date: 08/26/2018 Date of Consult: 09/18/2018  Primary Care Provider: Benita StabileHall, John Z, MD Primary Cardiologist: Nona DellSamuel McDowell, MD  Primary Electrophysiologist:  Lewayne BuntingGregg Taylor, MD    Patient Profile:   [name] is a 84 y.o. female with a hx of chronic systolic HF who is being seen today for the evaluation of edema and SOB at the request of Dr Aileen PilotZammitt.  History of Present Illness:   [name] 84 yo female history of bradycardia now with [employer]tro[employer] pacemaker, COPD on home O2, HTN, HL, PAF with eliquis on hold due to GI bleeding, DM2, chronic systolic HF with severe RV failure, presents with weight gain, edema, and SOB. Gradual onset of symptoms over the last several weeks.   Seen in cardiology clinic yesterday, weight up 16 lbs from 07/2018 visit (153-->169 lbs). Has not improved with increased diuretics at home, has been taking torsemide 40mg  in AM and 20mg  in PM   K 5.2, Cr 2.07 WBC 6.4 Hgb 9.7 271 BNP 759 CXR large probable loculated left pleural effusion 05/2018 echo LVEF 45%, mod MR, severe RV dysfunction EKG afib, RBBB  Past Medical History:  Diagnosis Date  . Asthma   . COPD (chronic obstructive pulmonary disease) (HCC)    Oxygen dependent  . Coronary atherosclerosis of native coronary artery   . Essential hypertension, benign   . History of GI bleed    Ulcer  . Hyperlipidemia   . Old inferior wall myocardial infarction 1999   [facility]  . PAF (paroxysmal atrial fibrillation) (HCC)   . Sinus node dysfunction (HCC)    Medtronic PPM  . Type 2 diabetes mellitus (HCC)     Past Surgical History:  Procedure Laterality Date  . BIOPSY  08/02/2018   Procedure: BIOPSY;  Surgeon: Corbin Adeourk, Robert M, MD;  [facility]: [facility];  Service: Endoscopy;;  gastric  . Cesareen section    . CHOLECYSTECTOMY    . COLONOSCOPY  09/2004   RMR: For obscure GI bleed. Left-sided diverticula,  otherwise normal ileocolonoscopy  . Coronary artery stent  1999   BMS x 3 RCA  ([facility]), PTCRA RC4  . EP IMPLANTABLE DEVICE N/A 06/26/2016   Procedure: Pacemaker Implant;  Surgeon: Will Jorja LoaMartin Camnitz, MD;  Location: [facility];  Service: Cardiovascular;  Laterality: N/A;  . ESOPHAGOGASTRODUODENOSCOPY  11 2005   RMR: For melena, normal exam.  . ESOPHAGOGASTRODUODENOSCOPY (EGD) WITH PROPOFOL N/A 08/02/2018   Procedure: ESOPHAGOGASTRODUODENOSCOPY (EGD) WITH PROPOFOL;  Surgeon: Corbin Adeourk, Robert M, MD;  Location: [facility];  Service: Endoscopy;  Laterality: N/A;  . EXPLORATORY LAPAROTOMY    . GIVENS CAPSULE STUDY  08/2004   official report unavailable, but reported couple of AVMs, nonbleeding  . IR PERC PLEURAL DRAIN W/INDWELL CATH W/IMG GUIDE  05/27/2018  . PARTIAL COLECTOMY  1977   blockage/gangrene? unclear if small bowel or colon  . THORACENTESIS Left 03/10/16   1 L transudative fluid     Inpatient Medications: Scheduled Meds:  Continuous Infusions:  PRN Meds:   Allergies:    Allergies  Allergen Reactions  . Codeine Nausea Only  . Heparin Other (See Comments)    Causes internal bleeding     Social History:   Social History   Socioeconomic History  . Marital status: Widowed    Spouse name: Not on file  . Number of children: 2  . Years of education: Not on file  . Highest  education level: Not on file  Occupational History  . Not on file  Social Needs  . Financial resource strain: Not on file  . Food insecurity:    Worry: Not on file    Inability: Not on file  . Transportation needs:    Medical: Not on file    Non-medical: Not on file  Tobacco Use  . Smoking status: Former Smoker    Packs/day: 2.00    Years: 40.00    Pack years: 80.00    Types: Cigarettes    Last attempt to quit: 01/13/1998    Years since quitting: 20.6  . Smokeless tobacco: Never Used  Substance and Sexual Activity  . Alcohol use: No    Alcohol/week: 0.0 standard drinks  . Drug use: No   . Sexual activity: Not on file  Lifestyle  . Physical activity:    Days per week: Not on file    Minutes per session: Not on file  . Stress: Not on file  Relationships  . Social connections:    Talks on phone: Not on file    Gets together: Not on file    Attends religious service: Not on file    Active member of club or organization: Not on file    Attends meetings of clubs or organizations: Not on file    Relationship status: Not on file  . Intimate partner violence:    Fear of current or ex partner: Not on file    Emotionally abused: Not on file    Physically abused: Not on file    Forced sexual activity: Not on file  Other Topics Concern  . Not on file  Social History Narrative  . Not on file    Family History:    Family History  Problem Relation Age of Onset  . Diabetes Mother   . Diabetes Father   . Heart disease Father   . Cancer Sister        unknown  . Cancer Brother        unknown  . Diabetes Brother   . Colon cancer Neg Hx      ROS:  Please see the history of present illness.  All other ROS reviewed and negative.     Physical Exam/Data:   Vitals:   09/19/18 1100 09-19-18 1112 09-19-18 1130 09-19-18 1200  BP: 103/70  (!) 100/57 103/68  Pulse:      Resp: 20  (!) 22 (!) 25  Temp:      TempSrc:      SpO2:  91%    Weight:      Height:       No intake or output data in the 24 hours ending 2018-09-19 1242 Filed Weights   September 19, 2018 1048  Weight: 75.3 kg   Body mass index is 28.49 kg/m.  General:  Well nourished, well developed, in no acute distress HEENT: normal Lymph: no adenopathy Neck: elevated JVD Endocrine:  No thryomegaly Cardiac:  Irreg, tachy Lungs:  Decreased breath sounds bilateral bases Abd: soft, nontender, no hepatomegaly  Ext: 2-3+ bilateral LE edema Musculoskeletal:  No deformities, BUE and BLE strength normal and equal Skin: warm and dry  Neuro:  CNs 2-12 intact, no focal abnormalities noted Psych:  Normal affect    Laboratory Data:  Chemistry Recent Labs  Lab 09/06/18 1427 2018-09-19 1119  NA 135 135  K 5.1 5.2*  CL 89* 89*  CO2 34* 33*  GLUCOSE 106* 135*  BUN 43* 47*  CREATININE  1.87* 2.07*  CALCIUM 9.1 9.0  GFRNONAA 23* 21*  GFRAA 27* 24*  ANIONGAP 12 13    Recent Labs  Lab 09/08/2018 1119  PROT 5.7*  ALBUMIN 3.6  AST 23  ALT 21  ALKPHOS 32*  BILITOT 0.7   Hematology Recent Labs  Lab 09/06/18 1427 09/06/2018 1119  WBC 7.0 6.4  RBC 4.08 4.30  HGB 9.5* 9.7*  HCT 35.5* 37.3  MCV 87.0 86.7  MCH 23.3* 22.6*  MCHC 26.8* 26.0*  RDW 19.2* 19.4*  PLT 257 271   Cardiac EnzymesNo results for input(s): TROPONINI in the last 168 hours. No results for input(s): TROPIPOC in the last 168 hours.  BNP Recent Labs  Lab 09/06/18 1428 08/21/2018 1119  BNP 697.0* 759.0*    DDimer No results for input(s): DDIMER in the last 168 hours.  Radiology/Studies:  Dg Chest 2 View  Result Date: 09/16/2018 CLINICAL DATA:  Shortness of breath. EXAM: CHEST - 2 VIEW COMPARISON:  Radiographs of July 30, 2018. FINDINGS: Stable cardiomegaly. Left-sided pacemaker is unchanged in position. No pneumothorax is noted. Large probably loculated left pleural effusion is noted with associated atelectasis. Right lung is unremarkable. Bony thorax is unremarkable. IMPRESSION: Large probable loculated left pleural effusion is noted which is increased in size compared to prior exam Aortic Atherosclerosis (ICD10-I70.0). Electronically Signed   By: Lupita Raider, M.D.   On: 09/13/2018 12:04    Assessment and Plan:   1. Acute on chronic systolic HF complicated by RV failure - approx 16 lbs weight gain since early October.Has not responded to more aggressive diuretics at home.  - received lasix 40mg  IV x 1 in ER. Dose additioanl 60mg , plan for 60mg  IV bid.  - repeat echo. Worsening renal function and volume overload did not respond to higher oral diruetics at home, concern for possible RV failure or worsening LV  function.  - unclear if she will respond to conventional IV diuresis, follow output and renal function today. Would recommend central access (PICC or central line) to obtain venous sats as I think she may have low output from her RV, may also require inotropes and perhaps a pressor in the very near future pending her clinical response.   2. Pleural effusion - per primary team, imaging reports some concern for loculation  3. AKI - admit Cr 2.07, was 0.9 one month ago - unclear if related to venous congstion from CHF, or perhaps poor cardiac output from RV failure - follow trends   4. Persistent Afib - family reports she has been back on anticoag x 1 month, had been off previously for GI bleed - mildly elevated rates I suspect driver by her decompensated HF  - follow bp's, may have to wean bisoprolol. Poor digoxin candidate due to age, renal function. If not able to tolerate beta locker would start amio gtt.    5. Pacemaker - Medtronic, normal function 07/2018 device check  6. COPD - on home O2  For questions or updates, please contact [facility] Please consult [email or web address] for contact info under     Signed, Dina Rich, MD  09/06/2018 12:42 PM

## 2018-09-07 NOTE — ED Notes (Signed)
CRITICAL VALUE ALERT  Critical Value:  Troponin 0.03  Date & Time Notied:  09/13/2018, 1315  Provider Notified: Dr, Estell HarpinZammit  Orders Received/Actions taken: see chart '

## 2018-09-07 NOTE — Procedures (Signed)
PreOperative Dx: Recurrent LEFT pleural effusion Postoperative Dx: Recurrent LEFT pleural effusion Procedure:   US guided LEFT thoracentesis Radiologist:  Tyron RussellBoles Anesthesia:  10 ml of 1% lidocaine Specimen:  800 mL of yellow colored fluid EBL:   < 1 ml Complications: None

## 2018-09-07 NOTE — ED Notes (Signed)
Pt turned to left side.

## 2018-09-07 NOTE — Progress Notes (Signed)
Patient admitted to unit, alert and oriented x4. No complaints of pain, shortness of breath, chest pain, dizziness, nausea or vomiting. Scattered bruising noted on bilateral arms and legs. Patient denies any recent falls. Blanchable redness noted to sacrum area. Foam dressing applied. Visitors in visiting, emotional support provided.

## 2018-09-08 DIAGNOSIS — I5043 Acute on chronic combined systolic (congestive) and diastolic (congestive) heart failure: Secondary | ICD-10-CM

## 2018-09-08 DIAGNOSIS — J449 Chronic obstructive pulmonary disease, unspecified: Secondary | ICD-10-CM

## 2018-09-08 DIAGNOSIS — I50810 Right heart failure, unspecified: Secondary | ICD-10-CM

## 2018-09-08 DIAGNOSIS — J90 Pleural effusion, not elsewhere classified: Secondary | ICD-10-CM

## 2018-09-08 DIAGNOSIS — I50813 Acute on chronic right heart failure: Secondary | ICD-10-CM

## 2018-09-08 LAB — COOXEMETRY PANEL
Carboxyhemoglobin: 2 % — ABNORMAL HIGH (ref 0.5–1.5)
METHEMOGLOBIN: 0.5 % (ref 0.0–1.5)
O2 SAT: 80.6 %
TOTAL HEMOGLOBIN: 9.7 g/dL — AB (ref 12.0–16.0)

## 2018-09-08 LAB — BASIC METABOLIC PANEL
Anion gap: 9 (ref 5–15)
BUN: 47 mg/dL — ABNORMAL HIGH (ref 8–23)
CO2: 36 mmol/L — ABNORMAL HIGH (ref 22–32)
CREATININE: 2.15 mg/dL — AB (ref 0.44–1.00)
Calcium: 8.7 mg/dL — ABNORMAL LOW (ref 8.9–10.3)
Chloride: 91 mmol/L — ABNORMAL LOW (ref 98–111)
GFR calc non Af Amer: 20 mL/min — ABNORMAL LOW (ref >60)
GFR, EST AFRICAN AMERICAN: 23 mL/min — AB (ref >60)
Glucose, Bld: 107 mg/dL — ABNORMAL HIGH (ref 70–99)
Potassium: 5.1 mmol/L (ref 3.5–5.1)
SODIUM: 136 mmol/L (ref 135–145)

## 2018-09-08 LAB — APTT
APTT: 115 s — AB (ref 24–36)
aPTT: 30 seconds (ref 24–36)

## 2018-09-08 LAB — HEPARIN LEVEL (UNFRACTIONATED): Heparin Unfractionated: >2.2 IU/mL — ABNORMAL HIGH (ref 0.30–0.70)

## 2018-09-08 MED ORDER — FUROSEMIDE 10 MG/ML IJ SOLN
20.0000 mg | Freq: Once | INTRAMUSCULAR | Status: AC
  Administered 2018-09-08: 20 mg via INTRAVENOUS

## 2018-09-08 MED ORDER — BISOPROLOL FUMARATE 5 MG PO TABS: 5.0000 mg | ORAL_TABLET | Freq: Every day | ORAL | Status: DC

## 2018-09-08 MED ORDER — FUROSEMIDE 10 MG/ML IJ SOLN
80.0000 mg | Freq: Two times a day (BID) | INTRAMUSCULAR | Status: DC
  Administered 2018-09-09: 80 mg via INTRAVENOUS
  Filled 2018-09-08 (×2): qty 8

## 2018-09-08 MED ORDER — HEPARIN (PORCINE) 25000 UT/250ML-% IV SOLN
800.0000 [IU]/h | INTRAVENOUS | Status: DC
  Administered 2018-09-08: 900 [IU]/h via INTRAVENOUS
  Filled 2018-09-08 (×2): qty 250

## 2018-09-08 MED ORDER — AMIODARONE HCL IN DEXTROSE 360-4.14 MG/200ML-% IV SOLN
60.0000 mg/h | INTRAVENOUS | Status: AC
  Administered 2018-09-08: 60 mg/h via INTRAVENOUS
  Filled 2018-09-08: qty 200

## 2018-09-08 MED ORDER — AMIODARONE HCL IN DEXTROSE 360-4.14 MG/200ML-% IV SOLN
30.0000 mg/h | INTRAVENOUS | Status: DC
  Administered 2018-09-08 – 2018-09-09 (×2): 30 mg/h via INTRAVENOUS
  Filled 2018-09-08 (×3): qty 200

## 2018-09-08 MED ORDER — LEVALBUTEROL HCL 0.63 MG/3ML IN NEBU
0.6300 mg | INHALATION_SOLUTION | Freq: Three times a day (TID) | RESPIRATORY_TRACT | Status: DC
  Administered 2018-09-08 – 2018-09-09 (×3): 0.63 mg via RESPIRATORY_TRACT
  Filled 2018-09-08 (×5): qty 3

## 2018-09-08 MED ORDER — NOREPINEPHRINE 4 MG/250ML-% IV SOLN
0.0000 ug/min | INTRAVENOUS | Status: DC
  Administered 2018-09-08: 5 ug/min via INTRAVENOUS
  Filled 2018-09-08 (×3): qty 250

## 2018-09-08 MED ORDER — MILRINONE LACTATE IN DEXTROSE 20-5 MG/100ML-% IV SOLN
0.1250 ug/kg/min | INTRAVENOUS | Status: DC
  Administered 2018-09-08 – 2018-09-09 (×2): 0.125 ug/kg/min via INTRAVENOUS
  Filled 2018-09-08 (×2): qty 100

## 2018-09-08 NOTE — H&P (Signed)
Advanced Heart Failure Team Consult Note   Primary Physician: Kathy Stabile, MD PCP-Cardiologist:  Kathy Dell, MD  Reason for Consultation: Heart Failure   HPI:    [name] is seen today for evaluation of heart failure at the request of [name].   [name] is an 84 year old with history of CAD s/p previous stenting @ [facility] ~20 years ago, bradycardia s/p Medtronic PPM, COPD on home, oxygen, HTN, hyperlipidemia, PAF, GI bleed, DMII, and biventricular heart failure who is being transferred from [facility] for further management of end-stage RV failure with hypotension.   In looking back at her echos she has had severe RV dysfunction dating back to her first echo in our system in 2016. She also has systolic HF with what looks like an ischemic CM with inferobasilar AK and moderate ischemic MR. The RV is massively dilated with septal flattening and mild TR (Personally reviewed)  She was admitted in 8/19 with apparent PNA and bilateral effusions. Underwent chest tube for what was felt to be exudative effusion but fluid analysis seems most compatible with transudate.   She was admitted in 10/19 with GI bleed and was transfused.   She has been living alone but has not left the house in a long time. Her daughter [name] does all the shopping and takes care of her. She is basically confined to a chair.   On 11/18 /2019 she was seen by [name] for HF follow up. She had 16 pound weight gain despite increasing diuretics at home. Torsemide was increased to 40 mg in am/20 mg in pm with little effect.   She presented to [facility] on 09/06/2018 with increased shortness of breath and weight gain. CXR showed  large loculated left pleural effusion. She had left pleural effusion with 800 cc fluid removed (transudative).  She was placed on IV lasix but had minimal response.Marland Kitchen Pertinent admission labs include: K 5.2, creatinine 2.1, BNP 759, and troponin 0.03.PICC line was placed with mixed venous  saturation 81%.  She has had poor urine output despite IV lasix. Today milrinone was started and bb was stopped. Amiodarone was started for A fib. She developed hypotension and is now on of Norepi. Denies CP or SOB. Sats in high 80s/low 90s on 3L. Very fatigued.   She has not had PFTs or any assessment for PE in past.   Echo 08/28/2018  - Left ventricle: The cavity size was normal. Wall thickness was   increased in a pattern of mild LVH. Systolic function was mildly   reduced. The estimated ejection fraction was 45%. Diffuse   hypokinesis. The study is not technically sufficient to allow   evaluation of LV diastolic function. - Regional wall motion abnormality: Akinesis of the basal inferior   myocardium; hypokinesis of the basal inferolateral myocardium. - Ventricular septum: Septal motion showed abnormal function and   dyssynergy. These changes are consistent with intraventricular   conduction delay. - Aortic valve: Mildly calcified annulus. Trileaflet; mildly   thickened, mildly calcified leaflets. Morphologically, there   appears to be mild calcific aortic stenosis. - Mitral valve: There was moderate regurgitation. - Left atrium: The atrium was severely dilated. - Right ventricle: The cavity size was severely dilated. Systolic   function was severely reduced. - Right atrium: The atrium was moderately dilated. - Tricuspid valve: There was mild regurgitation. - Systemic veins: IVC is dilated with normal respiratory variation.Echo 05/2018    Review of Systems: [y] = yes, [ ]  =  no   General: Weight gain [ y]; Weight loss [ ] ; Anorexia [name] ]; Fatigue [name] ]; Fever [ ] ; Chills [ ] ; Weakness [ y]  Cardiac: Chest pain/pressure [ ] ; Resting SOB [ ] ; Exertional SOB [name] ]; Orthopnea [ ] ; Pedal Edema y[ ] ; Palpitations [ ] ; Syncope [ ] ; Presyncope [ ] ; Paroxysmal nocturnal dyspnea[ ]   Pulmonary: Cough [ ] ; Wheezing[ ] ; Hemoptysis[ ] ; Sputum [ ] ; Snoring [name] ]  GI: Vomiting[ ] ; Dysphagia[ ] ;  Melena[ ] ; Hematochezia [ ] ; Heartburn[ ] ; Abdominal pain [ ] ; Constipation [ ] ; Diarrhea [ ] ; BRBPR [ ]   GU: Hematuria[ ] ; Dysuria [ ] ; Nocturia[ ]   Vascular: Pain in legs with walking [ ] ; Pain in feet with lying flat [ ] ; Non-healing sores [ ] ; Stroke [ ] ; TIA [ ] ; Slurred speech [ ] ;  Neuro: Headaches[ ] ; Vertigo[ ] ; Seizures[ ] ; Paresthesias[ ] ;Blurred vision [ ] ; Diplopia [ ] ; Vision changes [ ]   Ortho/Skin: Arthritis [ y]; Joint pain [name] ]; Muscle pain [ ] ; Joint swelling [ ] ; Back Pain [ ] ; Rash [ ]   Psych: Depression[y ]; Anxiety[ ]   Heme: Bleeding problems [ ] ; Clotting disorders [ ] ; Anemia [ y]  Endocrine: Diabetes [name] ]; Thyroid dysfunction[ ]   Home Medications Prior to Admission medications   Medication Sig Start Date End Date Taking? Authorizing Provider  apixaban (ELIQUIS) 5 MG TABS tablet Take 1 tablet (5 mg total) by mouth 2 (two) times daily. 09/06/18  Yes Kathy Kief, PA-Page  bisoprolol (ZEBETA) 5 MG tablet Take 1 tablet (5 mg total) by mouth 2 (two) times daily. 09/06/18  Yes Kathy Kief, PA-Page  ciprofloxacin (CIPRO) 500 MG tablet Take 500 mg by mouth 2 (two) times daily.   Yes [provider]  escitalopram (LEXAPRO) 20 MG tablet Take 20 mg by mouth at bedtime.    Yes [provider]  ferrous sulfate 325 (65 FE) MG EC tablet Take 325 mg by mouth daily.   Yes [provider]  ipratropium-albuterol (DUONEB) 0.5-2.5 (3) MG/3ML SOLN Take 3 mLs by nebulization 3 (three) times daily.   Yes [provider]  LORazepam (ATIVAN) 0.5 MG tablet Take 1 tablet (0.5 mg total) by mouth at bedtime. Take one tablet by mouth once daily 06/14/18  Yes Lassen, Kathy C, PA-Page  Melatonin 10 MG TABS Take 1 tablet by mouth at bedtime.   Yes [provider]  Multiple Vitamins-Minerals (CENTRUM SILVER 50+WOMEN PO) Take 1 tablet by mouth daily.   Yes [provider]  nitroGLYCERIN (NITROSTAT) 0.4 MG SL tablet Place 1 tablet (0.4 mg total) under the  tongue every 5 (five) minutes as needed for chest pain. 04/24/14  Yes Jonelle Sidle, MD  pantoprazole (PROTONIX) 20 MG tablet Take 20 mg by mouth 2 (two) times daily.    Yes [provider]  potassium chloride (K-DUR,KLOR-CON) 10 MEQ tablet Take 10 mEq by mouth 2 (two) times daily.   Yes [provider]  torsemide (DEMADEX) 20 MG tablet Take 20 mg by mouth daily. Pt taking 40 mg in am & 20 mg pm   Yes [provider]  vitamin B-12 (CYANOCOBALAMIN) 1000 MCG tablet Take 1,000 mcg by mouth daily.   Yes [provider]    Past Medical History: Past Medical History:  Diagnosis Date  . Asthma   . COPD (chronic obstructive pulmonary disease) (HCC)    Oxygen dependent  . Coronary atherosclerosis of native coronary artery   . Essential hypertension,  benign   . History of GI bleed    Ulcer  . Hyperlipidemia   . Old inferior wall myocardial infarction 1999   [facility]  . PAF (paroxysmal atrial fibrillation) (HCC)   . Sinus node dysfunction (HCC)    Medtronic PPM  . Type 2 diabetes mellitus (HCC)     Past Surgical History: Past Surgical History:  Procedure Laterality Date  . BIOPSY  08/02/2018   Procedure: BIOPSY;  Surgeon: Corbin Ade, MD;  Location: [facility];  Service: Endoscopy;;  gastric  . Cesareen section    . CHOLECYSTECTOMY    . COLONOSCOPY  09/2004   RMR: For obscure GI bleed. Left-sided diverticula, otherwise normal ileocolonoscopy  . Coronary artery stent  1999   BMS x 3 RCA  ([facility]), PTCRA RC4  . EP IMPLANTABLE DEVICE N/A 06/26/2016   Procedure: Pacemaker Implant;  Surgeon: Will Jorja Loa, MD;  Location: [facility] INVASIVE CV LAB;  Service: Cardiovascular;  Laterality: N/A;  . ESOPHAGOGASTRODUODENOSCOPY  11 2005   RMR: For melena, normal exam.  . ESOPHAGOGASTRODUODENOSCOPY (EGD) WITH PROPOFOL N/A 08/02/2018   Procedure: ESOPHAGOGASTRODUODENOSCOPY (EGD) WITH PROPOFOL;  Surgeon: Corbin Ade, MD;  Location: [facility];  Service:  Endoscopy;  Laterality: N/A;  . EXPLORATORY LAPAROTOMY    . GIVENS CAPSULE STUDY  08/2004   official report unavailable, but reported couple of AVMs, nonbleeding  . IR PERC PLEURAL DRAIN W/INDWELL CATH W/IMG GUIDE  05/27/2018  . PARTIAL COLECTOMY  1977   blockage/gangrene? unclear if small bowel or colon  . THORACENTESIS Left 03/10/16   1 L transudative fluid    Family History: Family History  Problem Relation Age of Onset  . Diabetes Mother   . Diabetes Father   . Heart disease Father   . Cancer Sister        unknown  . Cancer Brother        unknown  . Diabetes Brother   . Colon cancer Neg Hx     Social History: Social History   Socioeconomic History  . Marital status: Widowed    Spouse name: Not on file  . Number of children: 2  . Years of education: Not on file  . Highest education level: Not on file  Occupational History  . Not on file  Social Needs  . Financial resource strain: Not on file  . Food insecurity:    Worry: Not on file    Inability: Not on file  . Transportation needs:    Medical: Not on file    Non-medical: Not on file  Tobacco Use  . Smoking status: Former Smoker    Packs/day: 2.00    Years: 40.00    Pack years: 80.00    Types: Cigarettes    Last attempt to quit: 01/13/1998    Years since quitting: 20.6  . Smokeless tobacco: Never Used  Substance and Sexual Activity  . Alcohol use: No    Alcohol/week: 0.0 standard drinks  . Drug use: No  . Sexual activity: Not on file  Lifestyle  . Physical activity:    Days per week: Not on file    Minutes per session: Not on file  . Stress: Not on file  Relationships  . Social connections:    Talks on phone: Not on file    Gets together: Not on file    Attends religious service: Not on file    Active member of club or organization: Not on file    Attends meetings  of clubs or organizations: Not on file    Relationship status: Not on file  Other Topics Concern  . Not on file  Social History  Narrative  . Not on file    Allergies:  Allergies  Allergen Reactions  . Codeine Nausea Only  . Heparin Other (See Comments)    Causes internal bleeding     Objective:    Vital Signs:   Temp:  [97.5 F (36.4 Page)-98.5 F (36.9 Page)] 97.5 F (36.4 Page) (11/20 1135) Pulse Rate:  [25-111] 81 (11/20 1500) Resp:  [15-30] 20 (11/20 1500) BP: (55-138)/(27-103) 87/48 (11/20 1500) SpO2:  [86 %-100 %] 100 % (11/20 1521) Weight:  [75.5 kg-78.1 kg] 75.5 kg (11/20 0428) Last BM Date: 09/03/2018  Weight change: Filed Weights   09/01/2018 1048 08/27/2018 1615 09/08/18 0428  Weight: 75.3 kg 78.1 kg 75.5 kg    Intake/Output:   Intake/Output Summary (Last 24 hours) at 09/08/2018 1557 Last data filed at 09/08/2018 1300 Gross per 24 hour  Intake 740 ml  Output -  Net 740 ml      Physical Exam    General:  Elderly frail NAD HEENT: normal x for poor dentition Neck: supple. JVP to jaw . Carotids 2+ bilat; no bruits. No lymphadenopathy or thyromegaly appreciated. Cor: PMI nondisplaced. +RV lift  Irregular rate & rhythm. No rubs, gallops or murmurs. Lungs: decreased BS throughout worse in left base no wheeze Abdomen: soft, nontender, nondistended. No hepatosplenomegaly. No bruits or masses. Good bowel sounds. Extremities: no cyanosis, clubbing, rash, 2+ edema Neuro: alert & orientedx3, cranial nerves grossly intact. moves all 4 extremities w/o difficulty. Affect pleasant   Telemetry   AF 70-80s with intermittent RV pacing and PVCs. Personally reviewed   EKG    AF 109 RBBB very low volts Personally reviewed   Labs   Basic Metabolic Panel: Recent Labs  Lab 09/06/18 1427 09/06/2018 1119 09/03/2018 1627 09/08/18 0406  NA 135 135 134* 136  K 5.1 5.2* 4.9 5.1  CL 89* 89* 89* 91*  CO2 34* 33* 35* 36*  GLUCOSE 106* 135* 128* 107*  BUN 43* 47* 45* 47*  CREATININE 1.87* 2.07* 2.11* 2.15*  CALCIUM 9.1 9.0 8.9 8.7*    Liver Function Tests: Recent Labs  Lab 08/27/2018 1119  AST 23    ALT 21  ALKPHOS 32*  BILITOT 0.7  PROT 5.7*  ALBUMIN 3.6   No results for input(s): LIPASE, AMYLASE in the last 168 hours. No results for input(s): AMMONIA in the last 168 hours.  CBC: Recent Labs  Lab 09/06/18 1427 09/06/2018 1119  WBC 7.0 6.4  NEUTROABS  --  5.0  HGB 9.5* 9.7*  HCT 35.5* 37.3  MCV 87.0 86.7  PLT 257 271    Cardiac Enzymes: Recent Labs  Lab 08/21/2018 1119 08/23/2018 1627 09/15/2018 2257  TROPONINI 0.03* 0.03* 0.03*    BNP: BNP (last 3 results) Recent Labs    07/30/18 1839 09/06/18 1428 09/08/2018 1119  BNP 1,020.0* 697.0* 759.0*    ProBNP (last 3 results) No results for input(s): PROBNP in the last 8760 hours.   CBG: No results for input(s): GLUCAP in the last 168 hours.  Coagulation Studies: No results for input(s): LABPROT, INR in the last 72 hours.   Imaging   Dg Chest 1v Repeat Same Day  Result Date: 09/17/2018 CLINICAL DATA:  Confirm PICC line EXAM: CHEST - 1 VIEW SAME DAY COMPARISON:  September 07, 2018 FINDINGS: The right PICC line terminates in the central SVC.  There is a left-sided effusion with underlying opacity in the left mid lower lung. No other interval changes. IMPRESSION: Increasing effusion and underlying opacity in the left base. The right PICC line is in good position. Electronically Signed   By: Gerome Sam III M.D   On: 03-Oct-2018 19:26      Medications:     Current Medications: . Chlorhexidine Gluconate Cloth  6 each Topical Daily  . escitalopram  20 mg Oral QHS  . ferrous sulfate  325 mg Oral Daily  . furosemide  80 mg Intravenous BID  . levalbuterol  0.63 mg Nebulization Q8H  . LORazepam  0.5 mg Oral QHS  . pantoprazole  40 mg Oral BID  . patiromer  8.4 g Oral Daily  . patiromer  8.4 g Oral Once  . sodium chloride flush  10-40 mL Intracatheter Q12H  . sodium chloride flush  3 mL Intravenous Q12H  . vitamin B-12  1,000 mcg Oral Daily     Infusions: . sodium chloride    . amiodarone 60 mg/hr  (09/08/18 1209)  . amiodarone    . heparin 900 Units/hr (09/08/18 1233)  . milrinone 0.125 mcg/kg/min (09/08/18 1208)  . norepinephrine (LEVOPHED) Adult infusion 5 mcg/min (09/08/18 1515)       Patient Profile   [name] is an 84 year old with history of bradycardia, Medtronic PPM, COPD on oxygen HTN, hyperlipidemia, PAF, GI bleed, DMII, and biventricular heart failure.   Assessment/Plan   1. A/Page Biventricular Heart Failure - with end-stage RV failure  - ECHO this admit. LVEF 40-45% due to likely ischemic CM with inferobasilar AK and moderate ischemic MR. The RV is massively dilated with septal flattening and mild TR (Personally reviewed) - Admitted with hypotension, volumes overload and profound fatigue.  - With low volts on ECG, previous pacemaker, severe RV failure and recurrent pleural effusions, I am very suspicious for possible cardiac amyloidosis. However also need to consider CTEPH, RV failure due to significant ischemic MR and COPD-related PAH though there is no clubbing on exam and CT chest findings from earlier this year do not suggest severe COPD - I had a long talk with her and her daughter about her situation and what we may and may not have to offer her. We discussed aggressive care options versus a more comfort care approach.  - Given her advanced age and poor QOL, she seems to be leaning toward comfort but wants to talk to her daughter more first.  - She does request DNR.  - Will continue current pressors where they are currently and reassess in am. Co-ox is suprisingly high and concerns me for possible shunt physiology.  - If wants to be aggressive, will start with PYP scan, RHC with shunt run and possible VQ - Diurese as tolerated   2. Pleural Effusion, recurrent -10/03/18 S/P thoracentesis with 800 cc removed. - Analysis is transudative  3. AKI  - Creatinine elevated on admit >2. Baseline creatinine ~1.  - Continue inotrope support for now  4. A fib  - This  appears long standing - Now on IV amio. Will continue for now for rate control.  - Continue heparin  5. Hyperkalemia  - On [place]. Will follow  6. CAD  - s/p previous stents. No current angina  7. Acute on chronic hypoxic respiratory failure - continue O2 support - diurese as tolerated  8.  DNR/DNI - ok to continue pressors for now.  - No CPR, defib or intubation  CRITICAL CARE Performed by: Arvilla Meres  Total critical care time: 60 minutes  Critical care time was exclusive of separately billable procedures and treating other patients.  Critical care was necessary to treat or prevent imminent or life-threatening deterioration.  Critical care was time spent personally by me (independent of midlevel providers or residents) on the following activities: development of treatment plan with patient and/or surrogate as well as nursing, discussions with consultants, evaluation of patient's response to treatment, examination of patient, obtaining history from patient or surrogate, ordering and performing treatments and interventions, ordering and review of laboratory studies, ordering and review of radiographic studies, pulse oximetry and re-evaluation of patient's condition.    Length of Stay: 1  Arvilla Meres, MD  11:10 PM  Advanced Heart Failure Team Pager [phone] (M-F; 7a - 4p)  Please contact [facility] for night-coverage after hours (4p -7a ) and weekends on [email or web address]

## 2018-09-08 NOTE — Progress Notes (Signed)
ANTICOAGULATION CONSULT NOTE - Initial Consult  Pharmacy Consult for heparin infusion dosing Indication: atrial fibrillation  Allergies  Allergen Reactions  . Codeine Nausea Only  . Heparin Other (See Comments)    Causes internal bleeding     Patient Measurements: Height: 5\' 4"  (162.6 cm) Weight: 166 lb 7.2 oz (75.5 kg) IBW/kg (Calculated) : 54.7 Heparin Dosing Weight: HEPARIN DW (KG): 71.3  Vital Signs: Temp: 97.5 F (36.4 C) (11/20 1135) Temp Source: Oral (11/20 1135) BP: 89/59 (11/20 1100) Pulse Rate: 91 (11/20 1100)  Labs: Recent Labs    09/06/18 1427 08/25/2018 1119 08/24/2018 1627 09/03/2018 2257 09/08/18 0406  HGB 9.5* 9.7*  --   --   --   HCT 35.5* 37.3  --   --   --   PLT 257 271  --   --   --   CREATININE 1.87* 2.07* 2.11*  --  2.15*  TROPONINI  --  0.03* 0.03* 0.03*  --     Estimated Creatinine Clearance: 19 mL/min (A) (by C-G formula based on SCr of 2.15 mg/dL (H)).   Medical History: Past Medical History:  Diagnosis Date  . Asthma   . COPD (chronic obstructive pulmonary disease) (HCC)    Oxygen dependent  . Coronary atherosclerosis of native coronary artery   . Essential hypertension, benign   . History of GI bleed    Ulcer  . Hyperlipidemia   . Old inferior wall myocardial infarction 1999   [facility]  . PAF (paroxysmal atrial fibrillation) (HCC)   . Sinus node dysfunction (HCC)    Medtronic PPM  . Type 2 diabetes mellitus (HCC)     Medications:  Scheduled:  . Chlorhexidine Gluconate Cloth  6 each Topical Daily  . escitalopram  20 mg Oral QHS  . ferrous sulfate  325 mg Oral Daily  . furosemide  80 mg Intravenous BID  . LORazepam  0.5 mg Oral QHS  . pantoprazole  40 mg Oral BID  . patiromer  8.4 g Oral Daily  . patiromer  8.4 g Oral Once  . sodium chloride flush  10-40 mL Intracatheter Q12H  . sodium chloride flush  3 mL Intravenous Q12H  . vitamin B-12  1,000 mcg Oral Daily    Assessment:  Pharmacy consulted to dose heparin infusion for  this [name] with atrial fibrillation, formerly on apixaban.  Last dose of apixaban 2.5mg  was given last night.  Goal of Therapy:  Heparin level 0.3-0.7 units/ml Monitor platelets by anticoagulation protocol: Yes   Plan:  No heparin bolus needed Obtain aPTTs as appropriate until they correlate with heparin levels Start heparin infusion at 900 units/hr Check anti-Xa level in 6-8 hours and daily while on heparin Continue to monitor H&H and platelets  Tama Highamara Kenlyn Lose 09/08/2018,11:54 AM

## 2018-09-08 NOTE — Progress Notes (Addendum)
PROGRESS NOTE    [name]  ZOX:[id number] DOB: [birth year] DOA: 09-09-18 PCP: Benita Stabile, MD    Brief Narrative:  84 y/o with history of combined CHF, persistent A fib, chronic resp failure and COPD, was admitted to the hospital with shortness of breath, decompensated CHF and rapid atrial fibrillation. She was seen by cardiology. She was started on IV lasix with poor urine output. It was felt that she would likely need inotropic support and to be evaluated by CHF team, so she was started on milrinone and plans to transfer to [facility]. She is on amiodarone infusion for atrial fibrillation and intravenous heparin.   Assessment & Plan:   Active Problems:   Atrial fibrillation with RVR (HCC)   Anemia   Chronic respiratory failure with hypoxia and hypercapnia (HCC)   Acute on chronic combined systolic and diastolic CHF (congestive heart failure) (HCC)   Acute decompensated heart failure (HCC)   1. Acute on chronic combined CHF. EF of 40-45%. Complicated by RV failure. Approximately 16 lbs weight gain since October. Started on IV diuretics with poor response. Creatinine is trending up. Cardiology is following and has recommended starting on milrinone infusion and transfer to [facility] For CHF team evaluation. Discussed with Dr. Wyline Mood with recommendations to transfer to stepdown unit at [facility]. 2. Pleural effusion, patient had thoracentesis yesterday with removal of 800cc of fluid. Fluid analysis indicates transudate fluid, likely related to CHF 3. AKI. Likely related to low cardiac output. Creatinine trending up with diuresis. Continue to follow 4. Persistent A fib. Bisoprolol has been discontinued and she has been started on amiodarone infusion. She is chronically on eliquis, but this has been held for IV heparin in case she needs any invasive procedures 5. COPD with chronic hypoxic CHF. On home oxygen. Appears stable at this time. Continue on bronchodilators, TID 6. Sinus node dysfunction, s/p  PPM 7. Hyperkalemia, on veltassa. Potassium is stable   DVT prophylaxis: heparin infusion Code Status: DNR Family Communication: discussed with family at the bedside Disposition Plan: transfer to [facility] for CHF team evaluation   Consultants:   cardiology  Procedures:   11/19 left thoracentesis with removal of 800cc fluid Echo: - Left ventricle: The cavity size was normal. Wall thickness was   normal. Systolic function was mildly to moderately reduced. The   estimated ejection fraction was in the range of 40% to 45%. The   study is not technically sufficient to allow evaluation of LV   diastolic function. - Aortic valve: Mildly to moderately calcified annulus. Trileaflet;   moderately thickened leaflets. Valve area (VTI): 1.81 cm^2. Valve   area (Vmax): 1.87 cm^2. Valve area (Vmean): 1.54 cm^2. - Mitral valve: Mildly calcified annulus. Mildly thickened leaflets   . There was mild to moderate regurgitation. - Left atrium: The atrium was severely dilated. - Right ventricle: The ventricular septum is flattened in systole   and diastole consistent with RV pressure and volume overload. The   cavity size was severely dilated. Systolic function was severely   reduced. TAPSE: 8.25 mm . Lateral annulus peak S velocity: 4.29   cm/s. - Right atrium: The atrium was severely dilated. - Atrial septum: No defect or patent foramen ovale was identified. - Tricuspid valve: The TR jet is poorly visualized and likely   underestiamted. At least moderate TR is present. - Pulmonary arteries: PA peak pressure: 32 mm Hg (S). True   pulmonary pressures likely underestimated by echo in setting of   severe RV dysfunction.  Antimicrobials:      Subjective: Overall feels breathing mildly improved after thoracentesis yesterday. Poor urine output despite lasix. No chest pain  Objective: Vitals:   09/08/18 1000 09/08/18 1100 09/08/18 1135 09/08/18 1200  BP: (!) 88/53 (!) 89/59  91/79  Pulse: 80  91  94  Resp: 18 17  17   Temp:   (!) 97.5 F (36.4 C)   TempSrc:   Oral   SpO2: 100% 99%  99%  Weight:      Height:        Intake/Output Summary (Last 24 hours) at 09/08/2018 1215 Last data filed at September 18, 2018 1615 Gross per 24 hour  Intake 320 ml  Output -  Net 320 ml   Filed Weights   09-18-18 1048 2018-09-18 1615 09/08/18 0428  Weight: 75.3 kg 78.1 kg 75.5 kg    Examination:  General exam: Appears calm and comfortable  Respiratory system: diminished breath sounds at bases. Respiratory effort normal. Cardiovascular system: S1 & S2 heard, RRR. No JVD, murmurs, rubs, gallops or clicks. 2+ pedal edema. Gastrointestinal system: Abdomen is nondistended, soft and nontender. No organomegaly or masses felt. Normal bowel sounds heard. Central nervous system: Alert and oriented. No focal neurological deficits. Extremities: Symmetric 5 x 5 power. Skin: No rashes, lesions or ulcers Psychiatry: Judgement and insight appear normal. Mood & affect appropriate.     Data Reviewed: I have personally reviewed following labs and imaging studies  CBC: Recent Labs  Lab 09/06/18 1427 09-18-18 1119  WBC 7.0 6.4  NEUTROABS  --  5.0  HGB 9.5* 9.7*  HCT 35.5* 37.3  MCV 87.0 86.7  PLT 257 271   Basic Metabolic Panel: Recent Labs  Lab 09/06/18 1427 09-18-18 1119 Sep 18, 2018 1627 09/08/18 0406  NA 135 135 134* 136  K 5.1 5.2* 4.9 5.1  CL 89* 89* 89* 91*  CO2 34* 33* 35* 36*  GLUCOSE 106* 135* 128* 107*  BUN 43* 47* 45* 47*  CREATININE 1.87* 2.07* 2.11* 2.15*  CALCIUM 9.1 9.0 8.9 8.7*   GFR: Estimated Creatinine Clearance: 19 mL/min (A) (by C-G formula based on SCr of 2.15 mg/dL (H)). Liver Function Tests: Recent Labs  Lab September 18, 2018 1119  AST 23  ALT 21  ALKPHOS 32*  BILITOT 0.7  PROT 5.7*  ALBUMIN 3.6   No results for input(s): LIPASE, AMYLASE in the last 168 hours. No results for input(s): AMMONIA in the last 168 hours. Coagulation Profile: No results for input(s): INR,  PROTIME in the last 168 hours. Cardiac Enzymes: Recent Labs  Lab 09-18-18 1119 Sep 18, 2018 1627 09-18-2018 2257  TROPONINI 0.03* 0.03* 0.03*   BNP (last 3 results) No results for input(s): PROBNP in the last 8760 hours. HbA1C: No results for input(s): HGBA1C in the last 72 hours. CBG: No results for input(s): GLUCAP in the last 168 hours. Lipid Profile: No results for input(s): CHOL, HDL, LDLCALC, TRIG, CHOLHDL, LDLDIRECT in the last 72 hours. Thyroid Function Tests: No results for input(s): TSH, T4TOTAL, FREET4, T3FREE, THYROIDAB in the last 72 hours. Anemia Panel: No results for input(s): VITAMINB12, FOLATE, FERRITIN, TIBC, IRON, RETICCTPCT in the last 72 hours. Sepsis Labs: No results for input(s): PROCALCITON, LATICACIDVEN in the last 168 hours.  Recent Results (from the past 240 hour(s))  Culture, body fluid-bottle     Status: None (Preliminary result)   Collection Time: 09/18/2018  2:01 PM  Result Value Ref Range Status   Specimen Description PLEURAL  Final   Special Requests BOTTLES DRAWN AEROBIC AND ANAEROBIC 10  CC EACH  Final   Culture   Final    NO GROWTH < 24 HOURS Performed at [facility], [address] [place], [place] [address]    Report Status PENDING  Incomplete  Gram stain     Status: None   Collection Time: 04-27-18  2:01 PM  Result Value Ref Range Status   Specimen Description PLEURAL  Final   Special Requests NONE  Final   Gram Stain   Final    NO ORGANISMS SEEN CYTOSPIN SMEAR WBC PRESENT, PREDOMINANTLY MONONUCLEAR Performed at [facility], [address] [place], [place] [address]    Report Status January 30, 2018 FINAL  Final  MRSA PCR Screening     Status: None   Collection Time: 04-27-18  4:27 PM  Result Value Ref Range Status   MRSA by PCR NEGATIVE NEGATIVE Final    Comment:        The GeneXpert MRSA Assay (FDA approved for NASAL specimens only), is one component of a comprehensive MRSA colonization surveillance program. It is not intended to  diagnose MRSA infection nor to guide or monitor treatment for MRSA infections. Performed at [facility], [address] [place], [place] [address]          Radiology Studies: Dg Chest 1 View  Result Date: January 30, 2018 CLINICAL DATA:  Post LEFT thoracentesis EXAM: CHEST  1 VIEW COMPARISON:  Earlier study of January 30, 2018 FINDINGS: Decrease in LEFT pleural effusion and basilar atelectasis post thoracentesis. No pneumothorax. Heart remains enlarged with stable pacemaker leads. Minimal sub pulmonic RIGHT pleural effusion suspected. Bones demineralized. Atherosclerotic calcifications aorta. IMPRESSION: Decrease in LEFT pleural effusion and basilar atelectasis post thoracentesis without pneumothorax. Electronically Signed   By: Ulyses SouthwardMark  Boles M.D.   On: January 30, 2018 15:06   Dg Chest 2 View  Result Date: January 30, 2018 CLINICAL DATA:  Shortness of breath. EXAM: CHEST - 2 VIEW COMPARISON:  Radiographs of July 30, 2018. FINDINGS: Stable cardiomegaly. Left-sided pacemaker is unchanged in position. No pneumothorax is noted. Large probably loculated left pleural effusion is noted with associated atelectasis. Right lung is unremarkable. Bony thorax is unremarkable. IMPRESSION: Large probable loculated left pleural effusion is noted which is increased in size compared to prior exam Aortic Atherosclerosis (ICD10-I70.0). Electronically Signed   By: Lupita RaiderJames  Green Jr, M.D.   On: January 30, 2018 12:04   Dg Chest 1v Repeat Same Day  Result Date: January 30, 2018 CLINICAL DATA:  Confirm PICC line EXAM: CHEST - 1 VIEW SAME DAY COMPARISON:  September 07, 2018 FINDINGS: The right PICC line terminates in the central SVC. There is a left-sided effusion with underlying opacity in the left mid lower lung. No other interval changes. IMPRESSION: Increasing effusion and underlying opacity in the left base. The right PICC line is in good position. Electronically Signed   By: Gerome Samavid  Williams III M.D   On: January 30, 2018 19:26   [place] Thoracentesis  Asp Pleural Space W/img Guide  Result Date: January 30, 2018 INDICATION: Recurrent LEFT pleural effusion EXAM: ULTRASOUND GUIDED DIAGNOSTIC AND THERAPEUTIC LEFT THORACENTESIS MEDICATIONS: None. COMPLICATIONS: None immediate PROCEDURE: Procedure, benefits, and risks of procedure were discussed with patient. Written informed consent for procedure was obtained. Time out protocol followed. Pleural effusion localized by ultrasound at the posterior LEFT hemithorax. Skin prepped and draped in usual sterile fashion. Skin and soft tissues anesthetized with 10 mL of 1% lidocaine. 8 French thoracentesis catheter placed into the LEFT pleural space. 800 mL of yellow fluid aspirated by syringe pump and vacuum bottle. Procedure tolerated well by patient without immediate complication.  FINDINGS: A total of approximately 800 mL of LEFT pleural fluid was removed. Samples were sent to the laboratory as requested by the clinical team. IMPRESSION: Successful ultrasound guided LEFT thoracentesis yielding 800 mL of pleural fluid. Electronically Signed   By: Ulyses Southward M.D.   On: 2018-09-11 15:03        Scheduled Meds: . Chlorhexidine Gluconate Cloth  6 each Topical Daily  . escitalopram  20 mg Oral QHS  . ferrous sulfate  325 mg Oral Daily  . furosemide  80 mg Intravenous BID  . LORazepam  0.5 mg Oral QHS  . pantoprazole  40 mg Oral BID  . patiromer  8.4 g Oral Daily  . patiromer  8.4 g Oral Once  . sodium chloride flush  10-40 mL Intracatheter Q12H  . sodium chloride flush  3 mL Intravenous Q12H  . vitamin B-12  1,000 mcg Oral Daily   Continuous Infusions: . sodium chloride    . amiodarone 60 mg/hr (09/08/18 1209)  . amiodarone    . heparin    . milrinone 0.125 mcg/kg/min (09/08/18 1208)     LOS: 1 day    Critical care Time spent:74mins    Erick Blinks, MD [facility] Pager [phone]  If 7PM-7AM, please contact night-coverage [email or web address] Password [facility] 09/08/2018, 12:15 PM   Addendum  17:30:  Patient requiring higher doses of levophed to keep MAP> 65. Clinically, she is not in any distress and mental status is intact.  Since she is now on levophed, she will need to be transferred to an ICU bed at [facility]  Discussed with patient placement, and there appears to be available ICU beds on [facility].  Case reviewed with Dr. Herbie Baltimore on-call for cardiology at [facility] who accepted the patient to the ICU under Dr. Gala Romney.  [employer]

## 2018-09-08 NOTE — Progress Notes (Signed)
Double checked with Dr. Kerry HoughMemon about starting Heparin gtt since it is listed as an allergy for the patient, however, order given to start Heparin gtt with no bolus at this time. Will continue to monitor.

## 2018-09-08 NOTE — Progress Notes (Signed)
Spoke with ICU nursing staff, MAPs in high 50s, still on fairly low dose levophed and being titrated up. Foley catheter placed still with limited urine output. Follow with repeat lasix dosing tonight. With coox result would hold on increasing milrinone dose at this time. Perhaps if still limited diuresis overnight could titrate in AM. Spoke with Dr Kerry HoughMemon, due to lower bp's they have arranged [facility] for further management which is appropriate.   Dina RichJonathan Branch MD

## 2018-09-08 NOTE — Progress Notes (Signed)
ANTICOAGULATION CONSULT NOTE - Initial Consult  Pharmacy Consult for heparin infusion dosing Indication: atrial fibrillation  Allergies  Allergen Reactions  . Codeine Nausea Only    Patient Measurements: Height: 5\' 4"  (162.6 cm) Weight: 166 lb 7.2 oz (75.5 kg) IBW/kg (Calculated) : 54.7 HEPARIN DW (KG): 70.5  Vital Signs: Temp: 97.7 F (36.5 C) (11/20 2000) Temp Source: Oral (11/20 2000) BP: 100/48 (11/20 2000) Pulse Rate: 82 (11/20 2000)  Labs: Recent Labs    09/06/18 1427 12-Jul-2018 1119 12-Jul-2018 1627 12-Jul-2018 2257 09/08/18 0406 09/08/18 1226 09/08/18 1831  HGB 9.5* 9.7*  --   --   --   --   --   HCT 35.5* 37.3  --   --   --   --   --   PLT 257 271  --   --   --   --   --   APTT  --   --   --   --   --  30 115*  HEPARINUNFRC  --   --   --   --   --  >2.20*  --   CREATININE 1.87* 2.07* 2.11*  --  2.15*  --   --   TROPONINI  --  0.03* 0.03* 0.03*  --   --   --     Estimated Creatinine Clearance: 19 mL/min (A) (by C-G formula based on SCr of 2.15 mg/dL (H)).  Assessment: Pharmacy consulted to dose heparin infusion for this [age] yof with atrial fibrillation, formerly on apixaban.  Last dose of apixaban 2.5mg  was given last night.  Initial aPTT above goal.  Goal of Therapy:  APTT 66--102s Heparin level 0.3-0.7 units/ml Monitor platelets by anticoagulation protocol: Yes   Plan:  Obtain aPTTs as appropriate until they correlate with heparin levels Decrease heparin infusion at 800 units/hr Check aPTT and anti-Xa level in 6-8 hours and daily while on heparin Continue to monitor H&H and platelets  Mady GemmaHayes, Haakon Titsworth R 09/08/2018,8:36 PM

## 2018-09-08 NOTE — Progress Notes (Signed)
Paged Dr. Wyline MoodBranch x2 and called clinic about patient's hypotension/MAPs below 65. Awaiting reply.

## 2018-09-08 NOTE — Progress Notes (Signed)
Patient received lasix 80mg  830AM, no urine output. CVP 16, coox is pending. Suspect severe RV failure leading to failure to diurese, worsening renal function. Start milrinone, stop bisoprolol and start amio for her afib. Stop eliquis and start hep gtt in case invasive procedures are indicated. Will discuss with primary team [facility] and ask CHF service to evaluate patient.  Coox not drawn yet, I have asked nursing to draw and then start milrinone right after. MAPs holding around 65, may add levophed if needed for additional bp support.    Dominga FerryJ Raahil Ong MD

## 2018-09-08 NOTE — Progress Notes (Signed)
Progress Note  Patient Name: [name] Date of Encounter: 09/08/2018  Primary Cardiologist: Nona DellSamuel McDowell, MD   Subjective   SOB has improved some after thoracentesis  Inpatient Medications    Scheduled Meds: . apixaban  2.5 mg Oral BID  . bisoprolol  5 mg Oral BID  . Chlorhexidine Gluconate Cloth  6 each Topical Daily  . escitalopram  20 mg Oral QHS  . ferrous sulfate  325 mg Oral Daily  . furosemide  60 mg Intravenous BID  . LORazepam  0.5 mg Oral QHS  . pantoprazole  40 mg Oral BID  . patiromer  8.4 g Oral Daily  . patiromer  8.4 g Oral Once  . sodium chloride flush  10-40 mL Intracatheter Q12H  . sodium chloride flush  3 mL Intravenous Q12H  . vitamin B-12  1,000 mcg Oral Daily   Continuous Infusions: . sodium chloride     PRN Meds: sodium chloride, acetaminophen, ipratropium, levalbuterol, nitroGLYCERIN, ondansetron (ZOFRAN) IV, sodium chloride flush, sodium chloride flush   Vital Signs    Vitals:   09/08/18 0630 09/08/18 0633 09/08/18 0645 09/08/18 0800  BP: (!) 55/27 (!) 100/48 96/73 (!) 102/57  Pulse: (!) 56 91 (!) 108 (!) 26  Resp: 19 (!) 24 (!) 30 18  Temp:      TempSrc:      SpO2: 97% 96% 97% 99%  Weight:      Height:        Intake/Output Summary (Last 24 hours) at 09/08/2018 0821 Last data filed at 09/03/2018 1615 Gross per 24 hour  Intake 320 ml  Output -  Net 320 ml   Filed Weights   09/15/2018 1048 08/21/2018 1615 09/08/18 0428  Weight: 75.3 kg 78.1 kg 75.5 kg    Telemetry    afib 100s-110s - Personally Reviewed  ECG    na  Physical Exam   GEN: No acute distress.   Neck: elevated jvd Cardiac: irreg, 2/6 systolic murmur at apex Respiratory: Clear to auscultation bilaterally. GI: Soft, nontender, non-distended  MS: 2-3 + bilateral LE edema; No deformity. Neuro:  Nonfocal  Psych: Normal affect   Labs    Chemistry Recent Labs  Lab 08/21/2018 1119 09/10/2018 1627 09/08/18 0406  NA 135 134* 136  K 5.2* 4.9 5.1  CL  89* 89* 91*  CO2 33* 35* 36*  GLUCOSE 135* 128* 107*  BUN 47* 45* 47*  CREATININE 2.07* 2.11* 2.15*  CALCIUM 9.0 8.9 8.7*  PROT 5.7*  --   --   ALBUMIN 3.6  --   --   AST 23  --   --   ALT 21  --   --   ALKPHOS 32*  --   --   BILITOT 0.7  --   --   GFRNONAA 21* 20* 20*  GFRAA 24* 23* 23*  ANIONGAP 13 10 9      Hematology Recent Labs  Lab 09/06/18 1427 08/23/2018 1119  WBC 7.0 6.4  RBC 4.08 4.30  HGB 9.5* 9.7*  HCT 35.5* 37.3  MCV 87.0 86.7  MCH 23.3* 22.6*  MCHC 26.8* 26.0*  RDW 19.2* 19.4*  PLT 257 271    Cardiac Enzymes Recent Labs  Lab 09/15/2018 1119 08/31/2018 1627 08/24/2018 2257  TROPONINI 0.03* 0.03* 0.03*   No results for input(s): TROPIPOC in the last 168 hours.   BNP Recent Labs  Lab 09/06/18 1428 09/13/2018 1119  BNP 697.0* 759.0*     DDimer No results for input(s): DDIMER  in the last 168 hours.   Radiology    Dg Chest 1 View  Result Date: 09/15/2018 CLINICAL DATA:  Post LEFT thoracentesis EXAM: CHEST  1 VIEW COMPARISON:  Earlier study of 09/03/2018 FINDINGS: Decrease in LEFT pleural effusion and basilar atelectasis post thoracentesis. No pneumothorax. Heart remains enlarged with stable pacemaker leads. Minimal sub pulmonic RIGHT pleural effusion suspected. Bones demineralized. Atherosclerotic calcifications aorta. IMPRESSION: Decrease in LEFT pleural effusion and basilar atelectasis post thoracentesis without pneumothorax. Electronically Signed   By: Ulyses Southward M.D.   On: 08/21/2018 15:06   Dg Chest 2 View  Result Date: 09/06/2018 CLINICAL DATA:  Shortness of breath. EXAM: CHEST - 2 VIEW COMPARISON:  Radiographs of July 30, 2018. FINDINGS: Stable cardiomegaly. Left-sided pacemaker is unchanged in position. No pneumothorax is noted. Large probably loculated left pleural effusion is noted with associated atelectasis. Right lung is unremarkable. Bony thorax is unremarkable. IMPRESSION: Large probable loculated left pleural effusion is noted which is  increased in size compared to prior exam Aortic Atherosclerosis (ICD10-I70.0). Electronically Signed   By: Lupita Raider, M.D.   On: 09/02/2018 12:04   Dg Chest 1v Repeat Same Day  Result Date: 09/06/2018 CLINICAL DATA:  Confirm PICC line EXAM: CHEST - 1 VIEW SAME DAY COMPARISON:  September 07, 2018 FINDINGS: The right PICC line terminates in the central SVC. There is a left-sided effusion with underlying opacity in the left mid lower lung. No other interval changes. IMPRESSION: Increasing effusion and underlying opacity in the left base. The right PICC line is in good position. Electronically Signed   By: Gerome Sam III M.D   On: 09/11/2018 19:26   US Thoracentesis Asp Pleural Space W/img Guide  Result Date: 08/25/2018 INDICATION: Recurrent LEFT pleural effusion EXAM: ULTRASOUND GUIDED DIAGNOSTIC AND THERAPEUTIC LEFT THORACENTESIS MEDICATIONS: None. COMPLICATIONS: None immediate PROCEDURE: Procedure, benefits, and risks of procedure were discussed with patient. Written informed consent for procedure was obtained. Time out protocol followed. Pleural effusion localized by ultrasound at the posterior LEFT hemithorax. Skin prepped and draped in usual sterile fashion. Skin and soft tissues anesthetized with 10 mL of 1% lidocaine. 8 French thoracentesis catheter placed into the LEFT pleural space. 800 mL of yellow fluid aspirated by syringe pump and vacuum bottle. Procedure tolerated well by patient without immediate complication. FINDINGS: A total of approximately 800 mL of LEFT pleural fluid was removed. Samples were sent to the laboratory as requested by the clinical team. IMPRESSION: Successful ultrasound guided LEFT thoracentesis yielding 800 mL of pleural fluid. Electronically Signed   By: Ulyses Southward M.D.   On: 09/15/2018 15:03    Cardiac Studies    Patient Profile     [name] is a 84 y.o. female with a hx of chronic systolic HF who is being seen today for the evaluation of edema  and SOB at the request of Dr Aileen Pilot.  Assessment & Plan    1. Acute on chronic systolic HF complicated by severe RV failure - approx 16 lbs weight gain since early October.Has not responded to more aggressive diuretics at home.  - the I/Os documentation is incomplete from yesterday, spoke with nursing and not done overnight. She received lasix 40mg  in AM and 60mg  in PM. Cr trending up. Overall does not look to have diuresed. - repeat echo LVE 40-45%, severe RV failure - increase lasix to 80mg  IV bid. Order co-oximetry panel and CVP from PICC line, pending results and also her response to IV diuretics this AM would  consider starting milrinone. If inotropes started would plan for transfer to [facility] to be evaluted by CHF service.  - bisoprolol lowered to 5mg  dialy, low venous sat further downtitrate.    2. Pleural effusion - per primary team, s/p thoracentesis yesterday  3. AKI - admit Cr 2.07, was 0.9 one month ago - unclear if related to venous congstion from CHF, or perhaps poor cardiac output from RV failure - follow trends   4. Persistent Afib - family reports she has been back on anticoag x 1 month, had been off previously for GI bleed - mildly elevated rates I suspect driven by her decompensated HF  - continue bisoprolol at lower dose, if rates increase would start amio gtt.    5. Pacemaker - [employer], normal function 07/2018 device check  6. COPD - on home O2   For questions or updates, please contact [facility] Please consult [email or web address] for contact info under        Signed, Dina Rich, MD  09/08/2018, 8:21 AM

## 2018-09-08 NOTE — Patient Outreach (Signed)
[facility]  07/16/2018  Simone CuriaVirginia L Baumgarner 07/29/1931 [id number]   Follow up outreach with Ms. Rawe. No complaints of shortness of breath, chest discomfort or urgent concerns. No falls reported. Requested home visit for next outreach. Reported a pending Cardiology appointment on 07/21/18. Will tentatively schedule home visit for 07/22/2018. Member agreed to contact RNCM with questions or if appointment change is needed.  PLAN Will follow up on next week for home visit.  Katha CabalFelecia Nahlia Hellmann,RN [facility] Community Care Manager [phone]

## 2018-09-08 NOTE — Patient Outreach (Signed)
[facility]) Care Management  09/06/2018  Kathy CuriaVirginia L Page 01/29/1931 [id number]   Outreach call to [name]. Member stated she was preparing for a scheduled MD appointment at the time of the call. Requested that RNCM follow up within a week to schedule home visit.  PLAN Will follow up within a week.  Katha CabalFelecia Evalyse Stroope,RN [facility] Community Care Manager [phone]

## 2018-09-09 LAB — CBC
HEMATOCRIT: 33.2 % — AB (ref 36.0–46.0)
HEMOGLOBIN: 8.5 g/dL — AB (ref 12.0–15.0)
MCH: 21.9 pg — AB (ref 26.0–34.0)
MCHC: 25.6 g/dL — AB (ref 30.0–36.0)
MCV: 85.3 fL (ref 80.0–100.0)
Platelets: 252 10*3/uL (ref 150–400)
RBC: 3.89 MIL/uL (ref 3.87–5.11)
RDW: 18.2 % — ABNORMAL HIGH (ref 11.5–15.5)
WBC: 7.9 10*3/uL (ref 4.0–10.5)
nRBC: 0.4 % — ABNORMAL HIGH (ref 0.0–0.2)

## 2018-09-09 LAB — GLUCOSE, CAPILLARY: Glucose-Capillary: 150 mg/dL — ABNORMAL HIGH (ref 70–99)

## 2018-09-09 LAB — COOXEMETRY PANEL
Carboxyhemoglobin: 1.9 % — ABNORMAL HIGH (ref 0.5–1.5)
Methemoglobin: 1.2 % (ref 0.0–1.5)
O2 Saturation: 73 %
TOTAL HEMOGLOBIN: 8.9 g/dL — AB (ref 12.0–16.0)

## 2018-09-09 LAB — APTT: aPTT: 94 seconds — ABNORMAL HIGH (ref 24–36)

## 2018-09-09 LAB — HEPARIN LEVEL (UNFRACTIONATED): Heparin Unfractionated: >2.2 IU/mL — ABNORMAL HIGH (ref 0.30–0.70)

## 2018-09-09 MED ORDER — SODIUM CHLORIDE 0.9 % IV SOLN
2.0000 mg/h | INTRAVENOUS | Status: DC
  Filled 2018-09-09 (×2): qty 10

## 2018-09-09 MED ORDER — MORPHINE 100MG IN NS 100ML (1MG/ML) PREMIX INFUSION
5.0000 mg/h | INTRAVENOUS | Status: DC
  Filled 2018-09-09 (×2): qty 100

## 2018-09-09 MED ORDER — NOREPINEPHRINE 16 MG/250ML-% IV SOLN
0.0000 ug/min | INTRAVENOUS | Status: DC
  Administered 2018-09-09: 10 ug/min via INTRAVENOUS
  Filled 2018-09-09: qty 250

## 2018-09-09 MED ORDER — ORAL CARE MOUTH RINSE
15.0000 mL | Freq: Two times a day (BID) | OROMUCOSAL | Status: DC
  Administered 2018-09-09 – 2018-09-13 (×4): 15 mL via OROMUCOSAL

## 2018-09-09 MED ORDER — MIDAZOLAM BOLUS VIA INFUSION
2.0000 mg | Freq: Once | INTRAVENOUS | Status: DC
  Filled 2018-09-09: qty 2

## 2018-09-09 MED ORDER — MORPHINE BOLUS VIA INFUSION
5.0000 mg | Freq: Once | INTRAVENOUS | Status: DC
  Filled 2018-09-09: qty 5

## 2018-09-09 NOTE — Progress Notes (Signed)
   09/09/18 2000  Clinical Encounter Type  Visited With Patient and family together;Health care provider  Visit Type Initial;Other (Comment) (move to comfort care)  Referral From Nurse  Spiritual Encounters  Spiritual Needs Prayer;Emotional;Grief support  Stress Factors  Patient Stress Factors Major life changes;Other (Comment) (expects this to be EOL)  Family Stress Factors Major life changes;Other (Comment) (anticipatory grief)   Met w/ pt and 3 family members at bedside.  Pt alert and talking, wanted chaplain to pray w/ her that God would find her worthy and for peaceful death.  Prayed w/ pt and 3 family members.  Pt talked additionally about other EOL/post-death spiritual concerns.  She feels she has left nothing undone.  Chaplain remains available for add'l support as desired.  Myra Gianotti resident, [phone]

## 2018-09-09 NOTE — Progress Notes (Addendum)
Advanced Heart Failure Rounding Note  PCP-Cardiologist: Nona Dell, MD   Subjective:    Currently on norepi 10 mcg + milrinone 0.125 mcg. CO-OX 73%. Diuresing IV lasix. Sluggish diuresis noted.   Had a hard time sleeping. Denies SOB. Feels weak.    Objective:   Weight Range: 79.2 kg Body mass index is 29.97 kg/m.   Vital Signs:   Temp:  [97.4 F (36.3 C)-97.9 F (36.6 C)] 97.8 F (36.6 C) (11/21 0748) Pulse Rate:  [25-110] 80 (11/21 0830) Resp:  [15-27] 19 (11/21 0830) BP: (70-124)/(40-79) 118/53 (11/21 0830) SpO2:  [87 %-100 %] 97 % (11/21 0830) Weight:  [79.2 kg] 79.2 kg (11/21 0414) Last BM Date: 09/08/18  Weight change: Filed Weights   2018/10/04 1615 09/08/18 0428 09/09/18 0414  Weight: 78.1 kg 75.5 kg 79.2 kg    Intake/Output:   Intake/Output Summary (Last 24 hours) at 09/09/2018 0840 Last data filed at 09/09/2018 0800 Gross per 24 hour  Intake 1806.62 ml  Output 165 ml  Net 1641.62 ml      Physical Exam   CVP 12-13  General:  Pale. Appears chronically ill. No resp difficulty HEENT: Normal x for poor dentition  Neck: Supple. JVP 12-13 . Carotids 2+ bilat; no bruits. No lymphadenopathy or thyromegaly appreciated. Cor: PMI nondisplaced. Irregular rate & rhythm. 2/6 TR Lungs: Clear Abdomen: Soft, nontender, + distended. No hepatosplenomegaly. No bruits or masses. Good bowel sounds. Extremities: No cyanosis, clubbing, rash, R and LLE 1+edema. RUE PICC Neuro: Alert & orientedx3, cranial nerves grossly intact. moves all 4 extremities w/o difficulty. Affect flat    Telemetry  A fib 70-80s    EKG  N/a   Labs    CBC Recent Labs    04-Oct-2018 1119 09/09/18 0344  WBC 6.4 7.9  NEUTROABS 5.0  --   HGB 9.7* 8.5*  HCT 37.3 33.2*  MCV 86.7 85.3  PLT 271 252   Basic Metabolic Panel Recent Labs    25/95/63 1627 09/08/18 0406  NA 134* 136  K 4.9 5.1  CL 89* 91*  CO2 35* 36*  GLUCOSE 128* 107*  BUN 45* 47*  CREATININE 2.11* 2.15*    CALCIUM 8.9 8.7*   Liver Function Tests Recent Labs    October 04, 2018 1119  AST 23  ALT 21  ALKPHOS 32*  BILITOT 0.7  PROT 5.7*  ALBUMIN 3.6   No results for input(s): LIPASE, AMYLASE in the last 72 hours. Cardiac Enzymes Recent Labs    2018/10/04 1119 October 04, 2018 1627 2018-10-04 2257  TROPONINI 0.03* 0.03* 0.03*    BNP: BNP (last 3 results) Recent Labs    07/30/18 1839 09/06/18 1428 10/04/2018 1119  BNP 1,020.0* 697.0* 759.0*    ProBNP (last 3 results) No results for input(s): PROBNP in the last 8760 hours.   D-Dimer No results for input(s): DDIMER in the last 72 hours. Hemoglobin A1C No results for input(s): HGBA1C in the last 72 hours. Fasting Lipid Panel No results for input(s): CHOL, HDL, LDLCALC, TRIG, CHOLHDL, LDLDIRECT in the last 72 hours. Thyroid Function Tests No results for input(s): TSH, T4TOTAL, T3FREE, THYROIDAB in the last 72 hours.  Invalid input(s): FREET3  Other results:   Imaging     No results found.   Medications:     Scheduled Medications: . Chlorhexidine Gluconate Cloth  6 each Topical Daily  . escitalopram  20 mg Oral QHS  . ferrous sulfate  325 mg Oral Daily  . furosemide  80 mg Intravenous BID  .  levalbuterol  0.63 mg Nebulization Q8H  . LORazepam  0.5 mg Oral QHS  . pantoprazole  40 mg Oral BID  . patiromer  8.4 g Oral Daily  . patiromer  8.4 g Oral Once  . sodium chloride flush  10-40 mL Intracatheter Q12H  . sodium chloride flush  3 mL Intravenous Q12H  . vitamin B-12  1,000 mcg Oral Daily     Infusions: . sodium chloride    . amiodarone 30 mg/hr (09/08/18 2145)  . heparin 800 Units/hr (09/08/18 2150)  . milrinone 0.125 mcg/kg/min (09/08/18 2145)  . norepinephrine (LEVOPHED) Adult infusion 10 mcg/min (09/09/18 0819)     PRN Medications:  sodium chloride, acetaminophen, ipratropium, nitroGLYCERIN, ondansetron (ZOFRAN) IV, sodium chloride flush, sodium chloride flush    Patient Profile  Ms Foxworthy is an 84 year  old with history of bradycardia, Medtronic PPM, COPD on oxygen HTN, hyperlipidemia, PAF, GI bleed, DMII, and biventricular heart failure.    Assessment/Plan  1. A/C Biventricular Heart Failure - with end-stage RV failure  - ECHO this admit. LVEF 40-45% due to likely ischemic CM with inferobasilar AK and moderate ischemic MR. The RV is massively dilated with septal flattening and mild TR (Personally reviewed) - Admitted with hypotension, volumes overload and profound fatigue.  - With low volts on ECG, previous pacemaker, severe RV failure and recurrent pleural effusions, I am very suspicious for possible cardiac amyloidosis. However also need to consider CTEPH, RV failure due to significant ischemic MR and COPD-related PAH though there is no clubbing on exam and CT chest findings from earlier this year do not suggest severe COPD -Todays CO-OX 73%. On milrinone 0.125 mcg + norepi 10 mcg. Creatinine unchanged 2.1  - CVP 12-13. Sluggish urine output despite dual inotropes.   -  Co-ox is suprisingly high and concerns me for possible shunt physiology.  - If wants to be aggressive, will start with PYP scan, RHC with shunt run and possible VQ   2. Pleural Effusion, recurrent -September 15, 2018 S/P thoracentesis with 800 cc removed. - Analysis is transudative  3. AKI  - Creatinine elevated on admit >2. Baseline creatinine ~1.  - Continue inotrope support for now -Creatinine unchanged.   4. A fib  - This appears long standing. Rate controlled.  - Now on IV amio. Will continue for now for rate control.  - Continue heparin  5. Hyperkalemia  - K 5.1 today . Continue veltassa.   6. CAD  - s/p previous stents. No current angina  7. Acute on chronic hypoxic respiratory failure - continue O2 support - diurese as tolerated  8.  DNR/DNI - ok to continue pressors for now.  - No CPR, defib or intubation   - needs Palliative Care.    Medication concerns reviewed with patient and pharmacy team.  Barriers identified: NA  Length of Stay: 2  Amy Clegg, NP  09/09/2018, 8:40 AM  Advanced Heart Failure Team Pager [phone] (M-F; 7a - 4p)  Please contact [facility] for night-coverage after hours (4p -7a ) and weekends on [email or web address]  Patient seen and examined with Tonye Becket, NP. We discussed all aspects of the encounter. I agree with the assessment and plan as stated above.   She has end-stage RV failure. Now intotrope dependent. In reviewing echos, RV failure has been longstanding. Suspect possible amyloidosis. We discussed aggressive therapy vs comfort. Given her poor QOL she and her family have chosen comfort care. She said she is "tired" and "ready". They want to have some  more family join her then will stop inotropes. If she develops symptoms will start morphine and versed.   Arvilla Meresaniel Amelya Mabry, MD  4:07 PM

## 2018-09-09 NOTE — Plan of Care (Signed)
  Problem: Clinical Measurements: Goal: Respiratory complications will improve Outcome: Progressing   Problem: Activity: Goal: Risk for activity intolerance will decrease Outcome: Progressing   Problem: Coping: Goal: Level of anxiety will decrease Outcome: Progressing   Problem: Pain Managment: Goal: General experience of comfort will improve Outcome: Progressing   Problem: Safety: Goal: Ability to remain free from injury will improve Outcome: Progressing   

## 2018-09-09 NOTE — Progress Notes (Signed)
Informed MD Bensimhon that family has decided to move forward with comfort care measures at this time. Will discontinue the following continuous IV medications: amiodarone, levophed, milrinone, and heparin. Will plan to administer versed and morphine based upon patient comfort level or if SBP < 70.  Norva KarvonenPhillip M [name] Kovacik, RN

## 2018-09-09 NOTE — Consult Note (Signed)
   [facility]   09/09/2018  Simone CuriaVirginia L Wainright 01/07/1931 [id number]  Patient with an extreme risk score noted at 36% in the Medicare/[employer]. [facility] for chronic disease management services.  Patient has been engaged by a [facility]. Alert [facility] RNCM of patient's hospitalization.  [facility]. Will follow up with Inpatient Case Manager aware that [facility] following. Of note, [facility] services does not replace or interfere with any services that are needed or arranged by inpatient case management or social work.  For additional questions or referrals please contact:   Charlesetta ShanksVictoria Harriet Sutphen, RN BSN CCM [facility] Liaison  [phone] business mobile phone Toll free office [phone]

## 2018-09-09 NOTE — Progress Notes (Signed)
ANTICOAGULATION CONSULT NOTE   Pharmacy Consult for IV Heparin Indication: atrial fibrillation  Allergies  Allergen Reactions  . Codeine Nausea Only    Patient Measurements: Height: 5\' 4"  (162.6 cm) Weight: 174 lb 9.7 oz (79.2 kg) IBW/kg (Calculated) : 54.7 HEPARIN DW (KG): 70.5  Vital Signs: Temp: 97.7 F (36.5 C) (11/21 0000) Temp Source: Oral (11/21 0000) BP: 98/45 (11/21 0400) Pulse Rate: 75 (11/21 0415)  Labs: Recent Labs    09/06/18 1427 08-10-2018 1119 08-10-2018 1627 08-10-2018 2257 09/08/18 0406 09/08/18 1226 09/08/18 1831 09/09/18 0344  HGB 9.5* 9.7*  --   --   --   --   --  8.5*  HCT 35.5* 37.3  --   --   --   --   --  33.2*  PLT 257 271  --   --   --   --   --  252  APTT  --   --   --   --   --  30 115* 94*  HEPARINUNFRC  --   --   --   --   --  >2.20*  --   --   CREATININE 1.87* 2.07* 2.11*  --  2.15*  --   --   --   TROPONINI  --  0.03* 0.03* 0.03*  --   --   --   --     Estimated Creatinine Clearance: 19.5 mL/min (A) (by C-G formula based on SCr of 2.15 mg/dL (H)).  Assessment: Pharmacy consulted to dose heparin infusion for this [age] yof with atrial fibrillation, formerly on apixaban.  Last dose of apixaban 2.5mg  was given last night.  11/21 AM update: aPTT therapeutic x 1 after rate decrease, using aPTT to dose given apixaban influence on heparin levels  Goal of Therapy:  APTT 66-102 secs Heparin level 0.3-0.7 units/ml Monitor platelets by anticoagulation protocol: Yes   Plan:  Cont heparin at 800 units/hr Confirmatory aPTT at 1200  Abran DukeJames Charels Stambaugh, PharmD, BCPS Clinical Pharmacist Phone: [phone]

## 2018-09-10 ENCOUNTER — Other Ambulatory Visit: Payer: Self-pay

## 2018-09-10 MED ORDER — GLYCOPYRROLATE 0.2 MG/ML IJ SOLN
0.2000 mg | INTRAMUSCULAR | Status: DC | PRN
  Administered 2018-09-13 (×2): 0.2 mg via INTRAVENOUS
  Filled 2018-09-10 (×2): qty 1

## 2018-09-10 MED ORDER — GLYCOPYRROLATE 0.2 MG/ML IJ SOLN: 0.2000 mg | INTRAMUSCULAR | Status: DC | PRN

## 2018-09-10 MED ORDER — GLYCOPYRROLATE 1 MG PO TABS
1.0000 mg | ORAL_TABLET | ORAL | Status: DC | PRN
  Filled 2018-09-10: qty 1

## 2018-09-10 NOTE — Progress Notes (Addendum)
Advanced Heart Failure Rounding Note  PCP-Cardiologist: Nona DellSamuel McDowell, MD   Subjective:    Yesterday she transitioned to comfort care. All  drips stopped.    Sleeping this morning with family at the bedside.   Objective:   Weight Range: 79.2 kg Body mass index is 29.97 kg/m.   Vital Signs:   Temp:  [97.8 F (36.6 C)-97.9 F (36.6 C)] 97.9 F (36.6 C) (11/21 1601) Pulse Rate:  [73-98] 85 (11/22 0800) Resp:  [17-27] 19 (11/22 0800) BP: (66-124)/(39-81) 108/49 (11/22 0800) SpO2:  [92 %-100 %] 98 % (11/22 0800) Last BM Date: 09/08/18  Weight change: Filed Weights   April 21, 2018 1615 09/08/18 0428 09/09/18 0414  Weight: 78.1 kg 75.5 kg 79.2 kg    Intake/Output:   Intake/Output Summary (Last 24 hours) at 09/10/2018 0823 Last data filed at 09/10/2018 0600 Gross per 24 hour  Intake 591.25 ml  Output 1210 ml  Net -618.75 ml      Physical Exam  General:   No resp difficulty HEENT: normal Neck: supple. JVP 11-12   no JVD. Carotids 2+ bilat; no bruits. No lymphadenopathy or thryomegaly appreciated. Cor: PMI nondisplaced. Irregular rate & rhythm. No rubs, gallops or murmurs. Lungs: Decreased on the left.  Abdomen: soft, nontender, nondistended. No hepatosplenomegaly. No bruits or masses. Good bowel sounds. Extremities: no cyanosis, clubbing, rash, R and LLE 2+edema. RUE PICC  Neuro: sleeping.   Telemetry  A fib 70-80s    EKG  N/a   Labs    CBC Recent Labs    April 21, 2018 1119 09/09/18 0344  WBC 6.4 7.9  NEUTROABS 5.0  --   HGB 9.7* 8.5*  HCT 37.3 33.2*  MCV 86.7 85.3  PLT 271 252   Basic Metabolic Panel Recent Labs    16/10/96July 03, 2019 1627 09/08/18 0406  NA 134* 136  K 4.9 5.1  CL 89* 91*  CO2 35* 36*  GLUCOSE 128* 107*  BUN 45* 47*  CREATININE 2.11* 2.15*  CALCIUM 8.9 8.7*   Liver Function Tests Recent Labs    April 21, 2018 1119  AST 23  ALT 21  ALKPHOS 32*  BILITOT 0.7  PROT 5.7*  ALBUMIN 3.6   No results for input(s): LIPASE, AMYLASE in  the last 72 hours. Cardiac Enzymes Recent Labs    April 21, 2018 1119 April 21, 2018 1627 April 21, 2018 2257  TROPONINI 0.03* 0.03* 0.03*    BNP: BNP (last 3 results) Recent Labs    07/30/18 1839 09/06/18 1428 April 21, 2018 1119  BNP 1,020.0* 697.0* 759.0*    ProBNP (last 3 results) No results for input(s): PROBNP in the last 8760 hours.   D-Dimer No results for input(s): DDIMER in the last 72 hours. Hemoglobin A1C No results for input(s): HGBA1C in the last 72 hours. Fasting Lipid Panel No results for input(s): CHOL, HDL, LDLCALC, TRIG, CHOLHDL, LDLDIRECT in the last 72 hours. Thyroid Function Tests No results for input(s): TSH, T4TOTAL, T3FREE, THYROIDAB in the last 72 hours.  Invalid input(s): FREET3  Other results:   Imaging    No results found.   Medications:     Scheduled Medications: . Chlorhexidine Gluconate Cloth  6 each Topical Daily  . escitalopram  20 mg Oral QHS  . ferrous sulfate  325 mg Oral Daily  . furosemide  80 mg Intravenous BID  . LORazepam  0.5 mg Oral QHS  . mouth rinse  15 mL Mouth Rinse BID  . midazolam  2 mg Intravenous Once  . morphine  5 mg Intravenous Once  .  pantoprazole  40 mg Oral BID  . patiromer  8.4 g Oral Daily  . sodium chloride flush  10-40 mL Intracatheter Q12H  . sodium chloride flush  3 mL Intravenous Q12H  . vitamin B-12  1,000 mcg Oral Daily    Infusions: . sodium chloride    . amiodarone Stopped (09/09/18 1726)  . heparin Stopped (09/09/18 1726)  . midazolam (VERSED) infusion    . milrinone Stopped (09/09/18 1726)  . morphine    . norepinephrine (LEVOPHED) Adult infusion Stopped (09/09/18 1726)    PRN Medications: sodium chloride, acetaminophen, ipratropium, nitroGLYCERIN, ondansetron (ZOFRAN) IV, sodium chloride flush, sodium chloride flush    Patient Profile  Ms Bedonie is an 84 year old with history of bradycardia, Medtronic PPM, COPD on oxygen HTN, hyperlipidemia, PAF, GI bleed, DMII, and biventricular heart  failure.    Assessment/Plan  1. A/C Biventricular Heart Failure - with end-stage RV failure  - ECHO this admit. LVEF 40-45% due to likely ischemic CM with inferobasilar AK and moderate ischemic MR. The RV is massively dilated with septal flattening and mild TR (Personally reviewed) - Admitted with hypotension, volumes overload and profound fatigue.  - With low volts on ECG, previous pacemaker, severe RV failure and recurrent pleural effusions, I am very suspicious for possible cardiac amyloidosis. However also need to consider CTEPH, RV failure due to significant ischemic MR and COPD-related PAH though there is no clubbing on exam and CT chest findings from earlier this year do not suggest severe COPD Transitioned to comfort care 09/09/2018.   2. Pleural Effusion, recurrent -08/24/2018 S/P thoracentesis with 800 cc removed. - Analysis is transudative 3. AKI  - Creatinine elevated on admit >2. Baseline creatinine ~1.  4. A fib  Off heparin and amio with transition to comfort care.  5. Hyperkalemia  6. CAD  - s/p previous stents.  7. Acute on chronic hypoxic respiratory failure 8.  DNR/DNI - Transistioned to full comfort care. .  - No CPR, defib or intubation     Expect hospital death. Morphine and versed drip will be started later today.   Medication concerns reviewed with patient and pharmacy team. Barriers identified: NA  Length of Stay: 3  Amy Clegg, NP  09/10/2018, 8:23 AM  Advanced Heart Failure Team Pager [phone] (M-F; 7a - 4p)  Please contact [facility] for night-coverage after hours (4p -7a ) and weekends on [email or web address]  Patient seen and examined with Tonye Becket, NP. We discussed all aspects of the encounter. I agree with the assessment and plan as stated above.   Sleeping most of the day but will arouse and communicate with her family. Now off all pressors. SBP 70-100. Family all around bedside. Have discussed trajectory and want to proceed with comfort care.  Will start comfort drips if symptomatic or SBP < 70. Will move to 6N. If survives next few days will need to engage Hospice team for possible [facility] placement.   Arvilla Meres, MD  2:47 PM

## 2018-09-10 NOTE — Progress Notes (Signed)
Pt transferred from 2 heart, Palliative, alert and oriented, right PICC line, foley cath, 02nc at 2 liters, no s/s of resp distress, I will return the morphine and versed to pharmacy. Family at the bedside.

## 2018-09-10 NOTE — Progress Notes (Signed)
100 ml un-spiked bag of Morphine and 50 ml bag of un-spiked Versed handed over to Cleotis Nipperhess Estrera, RN upon patient handover on 6N.    Nicholaus BloomMelissa Garsta, RN

## 2018-09-10 NOTE — Patient Outreach (Signed)
[facility] Care Management  09/10/2018  Simone CuriaVirginia L Arntson 01/09/1931 [id number]   Case closure due to member transitioning to comfort care. Will notify MD and complete [facility] Case Closure.   Katha CabalFelecia Mikhai Bienvenue,RN [facility] Community Care Manager [phone]

## 2018-09-10 NOTE — Progress Notes (Signed)
Report called to Sharee Pimpleess Estrera, RN on 6 N. Nurse to call back when room finished being cleaned.  Delories HeinzMelissa Krystle Oberman, RN

## 2018-09-11 DIAGNOSIS — J9611 Chronic respiratory failure with hypoxia: Secondary | ICD-10-CM

## 2018-09-11 DIAGNOSIS — I509 Heart failure, unspecified: Secondary | ICD-10-CM

## 2018-09-11 DIAGNOSIS — Z515 Encounter for palliative care: Secondary | ICD-10-CM

## 2018-09-11 DIAGNOSIS — I4891 Unspecified atrial fibrillation: Secondary | ICD-10-CM

## 2018-09-11 DIAGNOSIS — J9612 Chronic respiratory failure with hypercapnia: Secondary | ICD-10-CM

## 2018-09-11 NOTE — Progress Notes (Signed)
   DAILY PROGRESS NOTE   Patient Name: [name] Date of Encounter: 09/11/2018  Subjective   Patient seen briefly at bedside. She appears comfortable, is coherent and answers questions. Denies pain or respiratory distress. Has refused morphine gtts.  Objective   Vitals:   09/10/18 1600 09/10/18 1646 09/10/18 2330 09/11/18 0545  BP: 97/61 106/78 (!) 98/46 101/61  Pulse: 95 97 75 (!) 101  Resp: 20  20 14   Temp:  97.9 F (36.6 C) 97.9 F (36.6 C) 97.9 F (36.6 C)  TempSrc:   Oral Oral  SpO2: 94% 93% 95% 95%  Weight:      Height:        Intake/Output Summary (Last 24 hours) at 09/11/2018 1134 Last data filed at 09/11/2018 0930 Gross per 24 hour  Intake 270 ml  Output 200 ml  Net 70 ml   Filed Weights   03-09-2018 1615 09/08/18 0428 09/09/18 0414  Weight: 78.1 kg 75.5 kg 79.2 kg    Physical Exam   Deferred  Inpatient Medications    Scheduled Meds: . Chlorhexidine Gluconate Cloth  6 each Topical Daily  . LORazepam  0.5 mg Oral QHS  . mouth rinse  15 mL Mouth Rinse BID  . midazolam  2 mg Intravenous Once  . morphine  5 mg Intravenous Once  . sodium chloride flush  10-40 mL Intracatheter Q12H  . sodium chloride flush  3 mL Intravenous Q12H    Continuous Infusions: . sodium chloride    . midazolam (VERSED) infusion    . morphine      PRN Meds: sodium chloride, acetaminophen, glycopyrrolate **OR** glycopyrrolate **OR** glycopyrrolate, nitroGLYCERIN, ondansetron (ZOFRAN) IV, sodium chloride flush, sodium chloride flush   Labs   No results found for this or any previous visit (from the past 48 hour(s)).  ECG   N/A  Telemetry   N/A  Radiology    No results found.  Cardiac Studies   N/A  Assessment   1. Active Problems: 2.   Atrial fibrillation with RVR (HCC) 3.   Anemia 4.   Chronic respiratory failure with hypoxia and hypercapnia (HCC) 5.   Acute on chronic combined systolic and diastolic CHF (congestive heart failure) (HCC) 6.    Acute decompensated heart failure (HCC) 7.   Plan   1. On comfort care per CHF service (Dr. Shirlee LatchMcLean not seeing this weekend) - anticipated in-hospital death, however, seems to be comfortable at this point, refusing pain meds. May need to involve palliative care for disposition next week if there is not a significant decline in her status over the weekend. I discussed this with her 2 daughter's who were present.  Time Spent Directly with Patient:  I have spent a total of 15 minutes with the patient reviewing hospital notes, telemetry, EKGs, labs and examining the patient as well as establishing an assessment and plan that was discussed personally with the patient.  > 50% of time was spent in direct patient care.  Length of Stay:  LOS: 4 days   Chrystie NoseKenneth C. Tashima Scarpulla, MD, [facility] HospitalFACC, FACP  [place]  [facility]  Medical Director of the Advanced Lipid Disorders &  Cardiovascular Risk Reduction Clinic Diplomate of the American Board of Clinical Lipidology Attending Cardiologist  Direct Dial: [phone]  Fax: [phone]  Website:  [email or web address]  Stacie Knutzen C Briannia Laba 09/11/2018, 11:34 AM

## 2018-09-11 NOTE — Progress Notes (Signed)
Patient has an order of morphine and versed drip to be administered this afternoon but family refused as of now for patient looks comfortable, will inform RN otherwise if they think she needs it.

## 2018-09-12 LAB — CULTURE, BODY FLUID-BOTTLE: CULTURE: NO GROWTH

## 2018-09-12 LAB — CULTURE, BODY FLUID W GRAM STAIN -BOTTLE

## 2018-09-12 MED ORDER — LORAZEPAM 0.5 MG PO TABS
0.5000 mg | ORAL_TABLET | Freq: Once | ORAL | Status: AC
  Administered 2018-09-12: 0.5 mg via ORAL
  Filled 2018-09-12: qty 1

## 2018-09-12 NOTE — Progress Notes (Signed)
@  [phone] Pt requesting Ativan to help her relax/sleep. Text page on call Dr Mayford Knifeurner @0307 . Second text page Dr. Mayford Knifeurner @0333  but no reply.Called answering service @ [phone].@[phone]  Received new order from Dr Mayford Knifeurner. Ativan 0.5mg  x1.

## 2018-09-12 NOTE — Progress Notes (Signed)
   DAILY PROGRESS NOTE   Patient Name: Simone CuriaVirginia L Winward Date of Encounter: 09/12/2018  Subjective   Breathing is more labored today, but she does not complain of pain or shortness of breath. Given ativan overnight. Family still feels that they do not want to give her morphine as long as she is "clear" and not in pain.  Objective   Vitals:   09/10/18 2330 09/11/18 0545 09/11/18 1336 09/12/18 0408  BP: (!) 98/46 101/61 (!) 106/56 (!) 108/54  Pulse: 75 (!) 101 89 (!) 102  Resp: 20 14 18  (!) 24  Temp: 97.9 F (36.6 C) 97.9 F (36.6 C) 97.6 F (36.4 C) (!) 97.3 F (36.3 C)  TempSrc: Oral Oral Axillary Oral  SpO2: 95% 95% 94% 95%  Weight:      Height:        Intake/Output Summary (Last 24 hours) at 09/12/2018 1133 Last data filed at 09/12/2018 0415 Gross per 24 hour  Intake 300 ml  Output 800 ml  Net -500 ml   Filed Weights   09/11/2018 1615 09/08/18 0428 09/09/18 0414  Weight: 78.1 kg 75.5 kg 79.2 kg    Physical Exam   General appearance: alert and no distress Lungs: diminished breath sounds bibasilar Heart: regular rate and rhythm Neurologic: Mental status: Awake, follows commands  Inpatient Medications    Scheduled Meds: . Chlorhexidine Gluconate Cloth  6 each Topical Daily  . LORazepam  0.5 mg Oral QHS  . mouth rinse  15 mL Mouth Rinse BID  . midazolam  2 mg Intravenous Once  . morphine  5 mg Intravenous Once  . sodium chloride flush  10-40 mL Intracatheter Q12H  . sodium chloride flush  3 mL Intravenous Q12H    Continuous Infusions: . sodium chloride    . midazolam (VERSED) infusion    . morphine      PRN Meds: sodium chloride, acetaminophen, glycopyrrolate **OR** glycopyrrolate **OR** glycopyrrolate, nitroGLYCERIN, ondansetron (ZOFRAN) IV, sodium chloride flush, sodium chloride flush   Labs   No results found for this or any previous visit (from the past 48 hour(s)).  ECG   N/A  Telemetry   N/A  Radiology    No results found.  Cardiac  Studies   N/A  Assessment   Active Problems:   Atrial fibrillation with RVR (HCC)   Anemia   Chronic respiratory failure with hypoxia and hypercapnia (HCC)   Acute on chronic combined systolic and diastolic CHF (congestive heart failure) (HCC)   Acute decompensated heart failure (HCC)   Comfort measures only status   Plan   1. Continue comfort care - CHF service to resume rounds tomorrow. May need palliative evaluation for outpatient hospice placement.  Time Spent Directly with Patient:  I have spent a total of 15 minutes with the patient reviewing hospital notes, telemetry, EKGs, labs and examining the patient as well as establishing an assessment and plan that was discussed personally with the patient.  > 50% of time was spent in direct patient care.  Length of Stay:  LOS: 5 days   Chrystie NoseKenneth C. Natassia Guthridge, MD, [facility] CenterFACC, FACP    [facility]  Medical Director of the Advanced Lipid Disorders &  Cardiovascular Risk Reduction Clinic Diplomate of the American Board of Clinical Lipidology Attending Cardiologist  Direct Dial: [phone]  Fax: [phone]  Website:  [email or web address]  Desirai Traxler C Demico Ploch 09/12/2018, 11:33 AM

## 2018-09-13 ENCOUNTER — Ambulatory Visit: Payer: Self-pay

## 2018-09-13 DIAGNOSIS — Z7189 Other specified counseling: Secondary | ICD-10-CM

## 2018-09-13 MED ORDER — MORPHINE SULFATE (CONCENTRATE) 10 MG/0.5ML PO SOLN
5.0000 mg | ORAL | Status: DC | PRN
  Administered 2018-09-13 (×3): 5 mg via ORAL
  Filled 2018-09-13 (×3): qty 0.5

## 2018-09-13 MED ORDER — LORAZEPAM 2 MG/ML PO CONC: 0.2500 mg | Freq: Four times a day (QID) | ORAL | Status: DC | PRN

## 2018-09-13 NOTE — Progress Notes (Addendum)
Advanced Heart Failure Rounding Note  PCP-Cardiologist: Nona Dell, MD   Subjective:    11/21 transitioned to comfort care.   Fatigued this am, but otherwise comfortable. Had intermittent SOB yesterday. Denies pain. Denies lightheadedness or dizziness.   Objective:   Weight Range: 79.2 kg Body mass index is 29.97 kg/m.   Vital Signs:   Temp:  [97.4 F (36.3 C)] 97.4 F (36.3 C) (11/25 0635) Pulse Rate:  [98] 98 (11/25 0635) Resp:  [16] 16 (11/25 0635) BP: (114)/(64) 114/64 (11/25 0635) SpO2:  [92 %] 92 % (11/25 0635) Last BM Date: 09/08/18  Weight change: Filed Weights   08/31/2018 1615 09/08/18 0428 09/09/18 0414  Weight: 78.1 kg 75.5 kg 79.2 kg    Intake/Output:   Intake/Output Summary (Last 24 hours) at 09/13/2018 0901 Last data filed at 09/13/2018 0636 Gross per 24 hour  Intake -  Output 450 ml  Net -450 ml      Physical Exam   General: Elderly and chronically appearing. No resp difficulty. HEENT: Normal Neck: Supple. JVP to jaw. Carotids 2+ bilat; no bruits. No thyromegaly or nodule noted. Cor: PMI nondisplaced. Irregular rate and rhythm. No M/G/R noted Lungs: CTAB, normal effort. Abdomen: Soft, non-tender, non-distended, no HSM. No bruits or masses. +BS  Extremities: No cyanosis, clubbing, or rash. 2+ BLE edema. RUE PICC.  Neuro: Alert & orientedx3, cranial nerves grossly intact. moves all 4 extremities w/o difficulty. Affect pleasant   Telemetry    Not connected.  EKG   No new tracings.    Labs    CBC No results for input(s): WBC, NEUTROABS, HGB, HCT, MCV, PLT in the last 72 hours. Basic Metabolic Panel No results for input(s): NA, K, CL, CO2, GLUCOSE, BUN, CREATININE, CALCIUM, MG, PHOS in the last 72 hours. Liver Function Tests No results for input(s): AST, ALT, ALKPHOS, BILITOT, PROT, ALBUMIN in the last 72 hours. No results for input(s): LIPASE, AMYLASE in the last 72 hours. Cardiac Enzymes No results for input(s): CKTOTAL,  CKMB, CKMBINDEX, TROPONINI in the last 72 hours.  BNP: BNP (last 3 results) Recent Labs    07/30/18 1839 09/06/18 1428 09/18/2018 1119  BNP 1,020.0* 697.0* 759.0*    ProBNP (last 3 results) No results for input(s): PROBNP in the last 8760 hours.   D-Dimer No results for input(s): DDIMER in the last 72 hours. Hemoglobin A1C No results for input(s): HGBA1C in the last 72 hours. Fasting Lipid Panel No results for input(s): CHOL, HDL, LDLCALC, TRIG, CHOLHDL, LDLDIRECT in the last 72 hours. Thyroid Function Tests No results for input(s): TSH, T4TOTAL, T3FREE, THYROIDAB in the last 72 hours.  Invalid input(s): FREET3  Other results:   Imaging    No results found.   Medications:     Scheduled Medications: . Chlorhexidine Gluconate Cloth  6 each Topical Daily  . LORazepam  0.5 mg Oral QHS  . mouth rinse  15 mL Mouth Rinse BID  . midazolam  2 mg Intravenous Once  . morphine  5 mg Intravenous Once  . sodium chloride flush  10-40 mL Intracatheter Q12H  . sodium chloride flush  3 mL Intravenous Q12H    Infusions: . sodium chloride    . midazolam (VERSED) infusion    . morphine      PRN Medications: sodium chloride, acetaminophen, glycopyrrolate **OR** glycopyrrolate **OR** glycopyrrolate, nitroGLYCERIN, ondansetron (ZOFRAN) IV, sodium chloride flush, sodium chloride flush    Patient Profile  Ms Mcqueary is an 84 year old with history of bradycardia,  Medtronic PPM, COPD on oxygen HTN, hyperlipidemia, PAF, GI bleed, DMII, and biventricular heart failure.    Assessment/Plan  1. A/C Biventricular Heart Failure - with end-stage RV failure  - ECHO this admit. LVEF 40-45% due to likely ischemic CM with inferobasilar AK and moderate ischemic MR. The RV is massively dilated with septal flattening and mild TR (Personally reviewed) - Admitted with hypotension, volumes overload and profound fatigue.  - With low volts on ECG, previous pacemaker, severe RV failure and recurrent  pleural effusions, high suspicion for possible cardiac amyloidosis. However also need to consider CTEPH, RV failure due to significant ischemic MR and COPD-related PAH though there is no clubbing on exam and CT chest findings from earlier this year do not suggest severe COPD - Transitioned to comfort care 09/09/2018, so no further work up at this time.  2. Pleural Effusion, recurrent -Jun 21, 2018 S/P thoracentesis with 800 cc removed. - Analysis is transudative. - No change to current plan.   3. AKI  - Creatinine elevated on admit >2. Baseline creatinine ~1. No further labs with comfort care.  4. A fib  - Off heparin and amio with transition to comfort care.  5. Hyperkalemia  - No further labs with comfort care.  6. CAD  - s/p previous stents.  7. Acute on chronic hypoxic respiratory failure 8.  DNR/DNI - Transistioned to full comfort care. Pending palliative care consult.   - No CPR, defib or intubation     Would be appropriate for residential hospice. Palliative consult pending. Morphine and versed drip in place as needed. Has not been on so far.   Medication concerns reviewed with patient and pharmacy team. Barriers identified: NA  Length of Stay: [address]  Graciella FreerMichael Andrew Tillery, PA-C  09/13/2018, 9:01 AM  Advanced Heart Failure Team Pager [phone] (M-F; 7a - 4p)  Please contact [facility] for night-coverage after hours (4p -7a ) and weekends on [email or web address]  Patient seen with PA, agree with the above note.  Plan for comfort measures and home hospice, palliative service following.   Marca AnconaDalton Ayson Cherubini 09/13/2018

## 2018-09-13 NOTE — Consult Note (Signed)
Consultation Note Date: 09/13/2018   Patient Name: [name]  DOB: 28-Dec-1930  MRN: [id number]  Age / Sex: 84 y.o., female  PCP: Celene Squibb, MD Referring Physician: Leonie Man, MD  Reason for Consultation: Establishing goals of care and Terminal Care  HPI/Patient Profile: 84 y.o. female  with past medical history of bradycardia s/p pmp, COPD on home oxygen, HTN, PAF, GI bleed, DMII, endstage biventricular HF, PNA with exuditve effusions 8/19 admitted on 08/31/2018 from [facility] with hypotension from end stage RV failure presenting with SOB and weight gain. On admit CXR showed L pleural effusion and she had 800 cc fluid removed.  She was transitioned to comfort on 11/21 by heart failure team. Palliative medicine consulted today for discussion of GOC to include home with hospice vs residential hospice.   Clinical Assessment and Goals of Care: Patient in bed asleep. Does not arouse to my voice or touch.  Daughter- [name] is at bedside.   I have reviewed medical records including EPIC notes, labs and imaging, received report from Oda Kilts, NP assessed the patient and then met at the bedside along with patient's niece and daughter  to discuss diagnosis prognosis, GOC, EOL wishes, disposition and options.  I introduced Palliative Medicine as specialized medical care for people living with serious illness. It focuses on providing relief from the symptoms and stress of a serious illness. The goal is to improve quality of life for both the patient and the family.  We discussed a brief life review of the patient. She lives in [place], is very involved in her church family. Described as funny, loves to joke.   As far as functional and nutritional status - they have noted over the last week, she is sleeping more than she is awake. She is eating and drinking some.    We discussed their current illness and  what it means in the larger context of their on-going co-morbidities.  Natural disease trajectory and expectations at EOL were discussed. Family states [place] and EOL wishes are that patient experiences EOL without any suffering. They note that when patient came into the hospital she told the "she was ready to go".   Family is requesting to transfer patient to residential hospice in  [facility].   We discussed symptom management- family notes patient has been awake and oriented- not complaining of pain or SOB. Her niece has noticed a change in her color. Her feet and legs are cool to my touch. She has a wet sounding cough. I observe her to be a little labored with her breathing. Her daughter states she tends to be labored at baseline but she appears worse today. We discussed the role of morphine for SOB at EOL. Daughter agrees to low dose oral morphine for visible SOB at this time. She would like to hold off on continuous infusion for as long as possible.   Primary Decision Maker NEXT OF KIN- patient's daughter- [name]    SUMMARY OF RECOMMENDATIONS -Comfort measures -Concentrated liquid morphine  $'5mg'X$  q1hr prn for SOB or cough -Lorazepam .25 liquid q6hr prn for anxiety or sleep or SOB -SW referral for residential hospice placement   Code Status/Advance Care Planning:  DNR  Palliative Prophylaxis:   Frequent Pain Assessment  Additional Recommendations (Limitations, Scope, Preferences):  Full Comfort Care  Psycho-social/Spiritual:   Desire for further Chaplaincy support:yes  Additional Recommendations: Caregiving  Support/Resources  Prognosis:    < 2 weeks d/t end stage R heart failure not responsive to diuresis and inotropes, transitioned to full comfort care  Discharge Planning: Hospice facility pending bed availability  Primary Diagnoses: Present on Admission: . Acute on chronic combined systolic and diastolic CHF (congestive heart failure) ([place]) . Anemia .  Atrial fibrillation with RVR ([place]) . Chronic respiratory failure with hypoxia and hypercapnia (HCC)   I have reviewed the medical record, interviewed the patient and family, and examined the patient. The following aspects are pertinent.  Past Medical History:  Diagnosis Date  . Asthma   . COPD (chronic obstructive pulmonary disease) ([place])    Oxygen dependent  . Coronary atherosclerosis of native coronary artery   . Essential hypertension, benign   . History of GI bleed    Ulcer  . Hyperlipidemia   . Old inferior wall myocardial infarction 1999   [facility]  . PAF (paroxysmal atrial fibrillation) ([place])   . Sinus node dysfunction (HCC)    Medtronic PPM  . Type 2 diabetes mellitus ([place])    Social History   Socioeconomic History  . Marital status: Widowed    Spouse name: Not on file  . Number of children: 2  . Years of education: Not on file  . Highest education level: Not on file  Occupational History  . Not on file  Social Needs  . Financial resource strain: Not on file  . Food insecurity:    Worry: Not on file    Inability: Not on file  . Transportation needs:    Medical: Not on file    Non-medical: Not on file  Tobacco Use  . Smoking status: Former Smoker    Packs/day: 2.00    Years: 40.00    Pack years: 80.00    Types: Cigarettes    Last attempt to quit: 01/13/1998    Years since quitting: 20.6  . Smokeless tobacco: Never Used  Substance and Sexual Activity  . Alcohol use: No    Alcohol/week: 0.0 standard drinks  . Drug use: No  . Sexual activity: Not on file  Lifestyle  . Physical activity:    Days per week: Not on file    Minutes per session: Not on file  . Stress: Not on file  Relationships  . Social connections:    Talks on phone: Not on file    Gets together: Not on file    Attends religious service: Not on file    Active member of club or organization: Not on file    Attends meetings of clubs or organizations: Not on file    Relationship status: Not  on file  Other Topics Concern  . Not on file  Social History Narrative  . Not on file   Family History  Problem Relation Age of Onset  . Diabetes Mother   . Diabetes Father   . Heart disease Father   . Cancer Sister        unknown  . Cancer Brother        unknown  . Diabetes Brother   . Colon cancer Neg Hx  Scheduled Meds: . Chlorhexidine Gluconate Cloth  6 each Topical Daily  . LORazepam  0.5 mg Oral QHS  . mouth rinse  15 mL Mouth Rinse BID  . midazolam  2 mg Intravenous Once  . morphine  5 mg Intravenous Once  . sodium chloride flush  10-40 mL Intracatheter Q12H  . sodium chloride flush  3 mL Intravenous Q12H   Continuous Infusions: . sodium chloride    . midazolam (VERSED) infusion    . morphine     PRN Meds:.sodium chloride, acetaminophen, glycopyrrolate **OR** glycopyrrolate **OR** glycopyrrolate, LORazepam, morphine CONCENTRATE, nitroGLYCERIN, ondansetron (ZOFRAN) IV, sodium chloride flush, sodium chloride flush Medications Prior to Admission:  Prior to Admission medications   Medication Sig Start Date End Date Taking? Authorizing Provider  apixaban (ELIQUIS) 5 MG TABS tablet Take 1 tablet (5 mg total) by mouth 2 (two) times daily. 09/06/18  Yes Imogene Burn, PA-C  bisoprolol (ZEBETA) 5 MG tablet Take 1 tablet (5 mg total) by mouth 2 (two) times daily. 09/06/18  Yes Imogene Burn, PA-C  ciprofloxacin (CIPRO) 500 MG tablet Take 500 mg by mouth 2 (two) times daily.   Yes [provider]  escitalopram (LEXAPRO) 20 MG tablet Take 20 mg by mouth at bedtime.    Yes [provider]  ferrous sulfate 325 (65 FE) MG EC tablet Take 325 mg by mouth daily.   Yes [provider]  ipratropium-albuterol (DUONEB) 0.5-2.5 (3) MG/3ML SOLN Take 3 mLs by nebulization 3 (three) times daily.   Yes [provider]  LORazepam (ATIVAN) 0.5 MG tablet Take 1 tablet (0.5 mg total) by mouth at bedtime. Take one tablet by mouth once daily 06/14/18  Yes  Lassen, Arlo C, PA-C  Melatonin 10 MG TABS Take 1 tablet by mouth at bedtime.   Yes [provider]  Multiple Vitamins-Minerals (CENTRUM SILVER 50+WOMEN PO) Take 1 tablet by mouth daily.   Yes [provider]  nitroGLYCERIN (NITROSTAT) 0.4 MG SL tablet Place 1 tablet (0.4 mg total) under the tongue every 5 (five) minutes as needed for chest pain. 04/24/14  Yes Satira Sark, MD  pantoprazole (PROTONIX) 20 MG tablet Take 20 mg by mouth 2 (two) times daily.    Yes [provider]  potassium chloride (K-DUR,KLOR-CON) 10 MEQ tablet Take 10 mEq by mouth 2 (two) times daily.   Yes [provider]  torsemide (DEMADEX) 20 MG tablet Take 20 mg by mouth daily. Pt taking 40 mg in am & 20 mg pm   Yes [provider]  vitamin B-12 (CYANOCOBALAMIN) 1000 MCG tablet Take 1,000 mcg by mouth daily.   Yes [provider]   Allergies  Allergen Reactions  . Codeine Nausea Only   Review of Systems  Unable to perform ROS: Acuity of condition    Physical Exam  Cardiovascular:  Irregular, R> Left leg with pitting edema, BLE cool to touch, pedal pulses weak  Pulmonary/Chest:  labored  Neurological:  lethargic  Nursing note and vitals reviewed.   Vital Signs: BP 114/64 (BP Location: Left Arm)   Pulse 98   Temp (!) 97.4 F (36.3 C) (Axillary)   Resp 16   Ht '5\' 4"'$  (1.626 m)   Wt 79.2 kg   SpO2 92%   BMI 29.97 kg/m  Pain Scale: 0-10 POSS *See Group Information*: 1-Acceptable,Awake and alert Pain Score: 0-No pain   SpO2: SpO2: 92 % O2 Device:SpO2: 92 % O2 Flow Rate: .O2 Flow Rate (L/min): 2 L/min  IO:  Intake/output summary:   Intake/Output Summary (Last 24 hours) at 09/13/2018 1342 Last data filed at 09/13/2018 0636 Gross per 24 hour  Intake -  Output 450 ml  Net -450 ml    LBM: Last BM Date: 09/08/18 Baseline Weight: Weight: 75.3 kg Most recent weight: Weight: 79.2 kg     Palliative Assessment/Data: PPS: 20%     Thank you for  this consult. Palliative medicine will continue to follow and assist as needed.   Time In: 4270 Time Out: 1400 Time Total: 75 minutes Greater than 50%  of this time was spent counseling and coordinating care related to the above assessment and plan.  Signed by: Mariana Kaufman, AGNP-C Palliative Medicine    Please contact Palliative Medicine Team phone at [phone] for questions and concerns.  For individual provider: See Shea Evans

## 2018-09-13 NOTE — Clinical Social Work Note (Signed)
CSW received consult for Hospice placement and [facility]. [facility] and referral made. CSW will continue to follow and facilitate discharge to hospice facility when a bed is available.  Genelle BalVanessa Kartik Fernando, MSW, LCSW Licensed Clinical Social Worker Clinical Social Work Department [facility] [phone]

## 2018-09-13 NOTE — Plan of Care (Signed)
  Problem: Coping: Goal: Level of anxiety will decrease Outcome: Progressing   Problem: Elimination: Goal: Will not experience complications related to bowel motility Outcome: Progressing Goal: Will not experience complications related to urinary retention Outcome: Progressing   

## 2018-09-13 NOTE — Care Management Note (Signed)
Case Management Note  Patient Details  Name: Kathy CuriaVirginia L Searing MRN: [id number] Date of Birth: 04/19/1931  Subjective/Objective:                    Action/Plan: [facility].  Continuing to follow.  Expected Discharge Date:                  Expected Discharge Plan:     In-House Referral:     Discharge planning Services     Post Acute Care Choice:    Choice offered to:     DME Arranged:    DME Agency:     HH Arranged:    HH Agency:     Status of Service:  In process, will continue to follow  If discussed at Long Length of Stay Meetings, dates discussed:    Additional Comments:  Kingsley PlanWile, Thomos Domine Marie, RN 09/13/2018, 10:11 AM

## 2018-09-13 NOTE — Consult Note (Signed)
   [facility]   09/13/2018  Kathy CuriaVirginia L Page 01/24/1931 [id number]    [facility].  Chart reviewed. Noted patient is full comfort measures. Likely disposition is residential hospice.  No further [facility] Care Management needs identified.   Raiford NobleAtika Hall, MSN-Ed, RN,BSN [facility] Care Management Hospital Liaison [phone]

## 2018-09-19 NOTE — Progress Notes (Signed)
Called Murtis SinkKristi Alverson, patient's daughter, on both home and cell phone. Left message on home phone asking Ms. Alverson to call me as soon as possible. Unable to leave message on cell phone as voicemail was full.

## 2018-09-19 NOTE — Progress Notes (Signed)
0008 pt found unresponsive, not breathing ,pulseless. Death pronounced by Leonie ManNancy Ottis Sarnowski RN and Marylin CrosbyJosephine Carson RN. Family and physician notified. Patient's dentures upper and lowers in patient's mouth. Body sent to morgue.

## 2018-09-19 NOTE — Progress Notes (Signed)
Called [name] at both her home and cell numbers. Left message on home phone for [name] to call me as soon as possible.  Voce mailbox on cell phone was full and I could not leave a message at that number.

## 2018-09-19 NOTE — Death Summary Note (Addendum)
  Advanced Heart Failure Death Summary  Death Summary   Patient ID: [name] MRN: [id number], DOB/AGE: 82/06/1931 82 y.o. Admit date: 09/06/2018 D/C date:     12/07/2017   Primary Discharge Diagnoses:  1. A/C Biventricular Heart Failure- with end-stage RV failure  2. Pleural Effusion, recurrent 3. AKI 4. A fib 5. Hyperkalemia 6. CAD  7. Acute on chronic hypoxic respiratory failure 8. DNR/DNI  Hospital Course:   [name] is a 84 y.o. female with history of bradycardia, [employer] PPM, COPD on oxygen HTN, hyperlipidemia, PAF, GI bleed, DMII, and biventricular heart failure.  She presented to [facility] on 08/28/2018 with increased shortness of breath and weight gain. CXR showed large loculated left pleural effusion. She had left thoracentesis with 800 cc fluid removed (transudative). She was placed on IV lasix but had minimal response.Marland Kitchen. Pertinent admission labs include: K 5.2, creatinine 2.1, BNP 759, and troponin 0.03.PICC line was placed with mixed venous saturation 81%.  She has had poor urine output despite IV lasix. 09/08/18 milrinone was started and bb was stopped. Amiodarone was started for A fib.She developed hypotension and required transient support on Norepi. Thought to be inotrope depending with end-stage RV failure.  [facility] to AHF team.   Dr. Gala RomneyBensimhon reviewed the chart and patient found to have long h/o severe RV failure/dysfunction. Patient with progressive infirmity over past 6 months with poor QL. [facility], Dr. Gala RomneyBensimhon had long discussion with patient and family 09/09/18. Family and pt decided to pursue comfort care. Inotrope support stopped once family arrived. Morphine and versed drips kept on standby for symptoms, but family hesitant to give morphine unless patient "out of it".  Pt became more fatigued over the weekend with intermittent SOB. Palliative care consulted on 09/12/18 for home with  hospice vs residential hospice. Family and pt agreed to residential hospice and planned to follow up on 02/22/18.  On RN check at 0008 am, pt found to be unresponsive, pulseless, and non-breathing. Pronounced by Leonie ManNancy Irish RN and Marylin CrosbyJosephine Carson RN. Family and physician notified.   Ultimately, patient passed away from end stage biventricular heart failure. Every effort was made during this and previous admissions to improved this patients clinical picture.   Duration of Discharge Encounter: Greater than 35 minutes  Signed, Graciella FreerMichael Andrew Tillery, PA-C 12/07/2017, 11:34 AM   Agree with above.   Arvilla Meresaniel Ariadna Setter, MD  2:44 PM

## 2018-09-19 DEATH — deceased

## 2018-09-20 ENCOUNTER — Encounter

## 2018-09-20 ENCOUNTER — Ambulatory Visit: Payer: Medicare Other | Admitting: Gastroenterology

## 2018-09-20 ENCOUNTER — Telehealth: Payer: Self-pay | Admitting: Cardiology

## 2018-09-20 NOTE — Telephone Encounter (Signed)
Belva BertinAdvised Donna at [facility] should [facility] patients pcp Dr.Zack Margo AyeHall. She states she did not know who the pcp was, but, will call him

## 2018-09-20 NOTE — Telephone Encounter (Signed)
New Message   Lupita LeashDonna from [facility] is calling wanting to know which physician will be providing the death certificate. Please call

## 2018-09-27 ENCOUNTER — Ambulatory Visit: Payer: Medicare Other | Admitting: Cardiology

## 2018-09-29 DIAGNOSIS — Z7189 Other specified counseling: Secondary | ICD-10-CM

## 2018-09-29 DIAGNOSIS — Z515 Encounter for palliative care: Secondary | ICD-10-CM

## 2018-11-10 ENCOUNTER — Ambulatory Visit: Payer: Medicare Other | Admitting: Gastroenterology

## 2019-09-03 IMAGING — US US EXTREM LOW VENOUS BILAT
1 series · 13 of 24 positions shown · non-contrast
Comparison: None.

CLINICAL DATA: 87-year-old female with bilateral lower extremity
pain and shortness of breath



[Series 1: us extrem low venous bilat · 0.07mm/px · 13 of 63 slices shown]
[im 1/63]
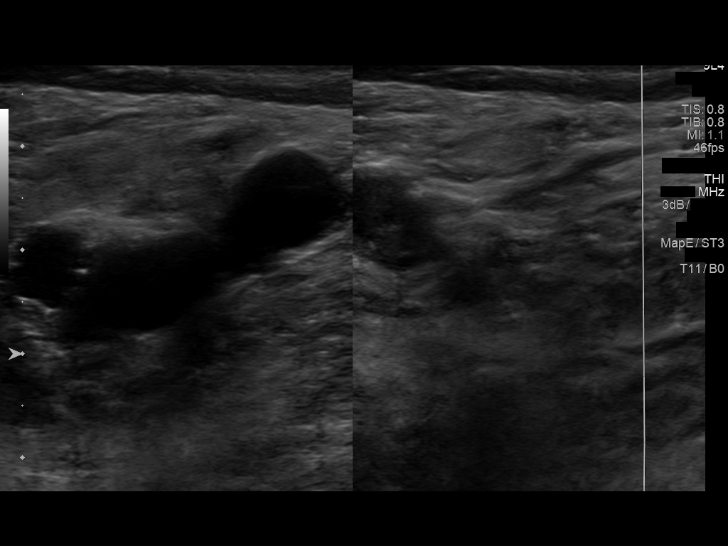
[im 6/63]
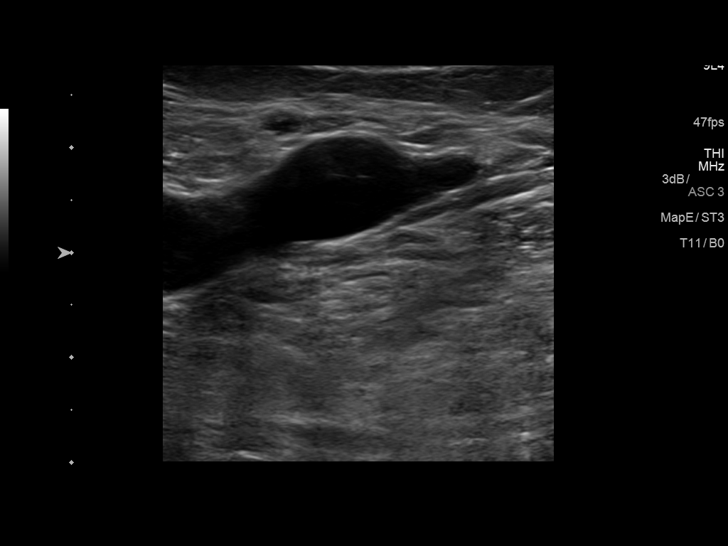
[im 11/63]
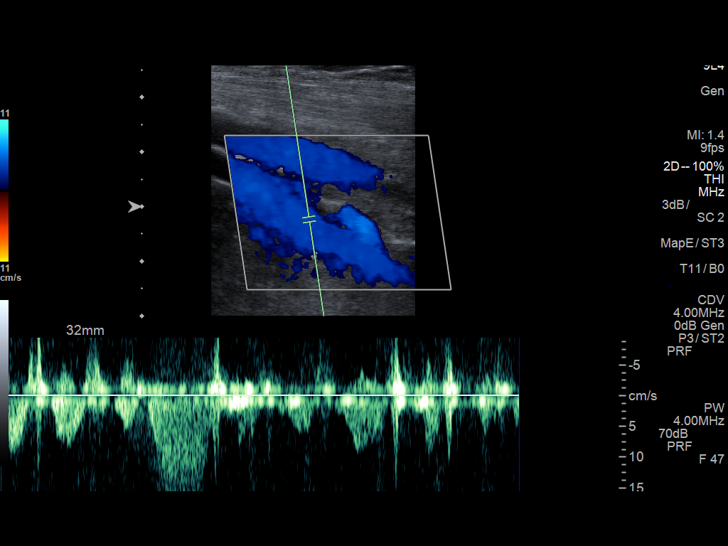
[im 17/63]
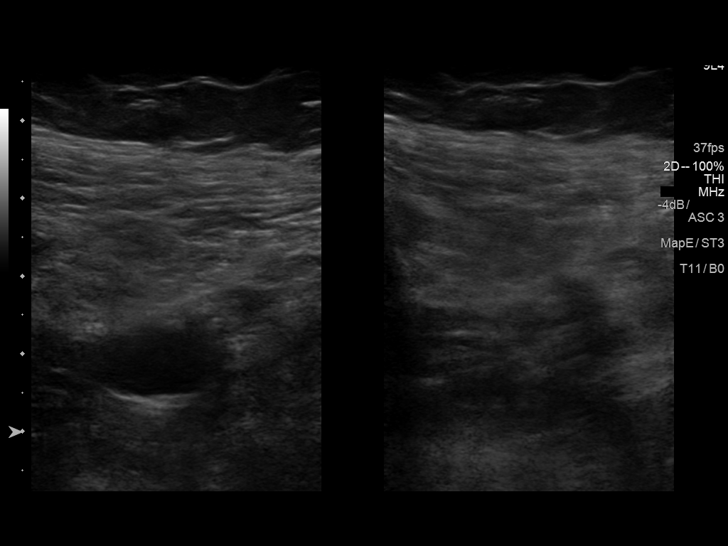
[im 22/63]
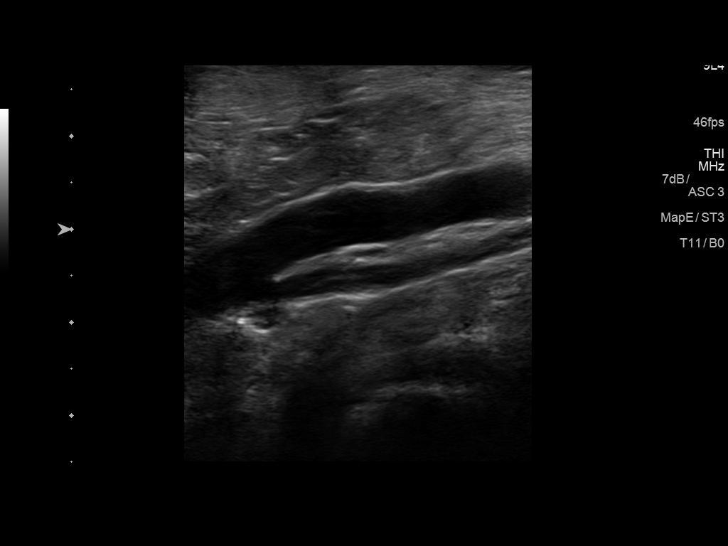
[im 27/63]
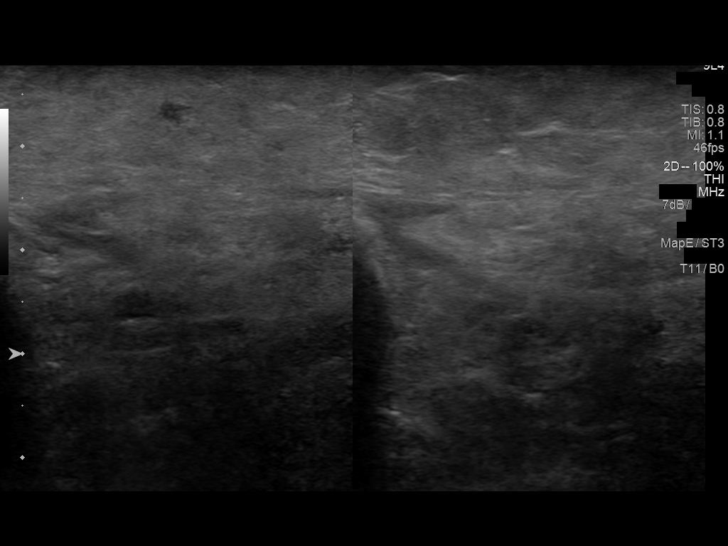
[im 33/63]
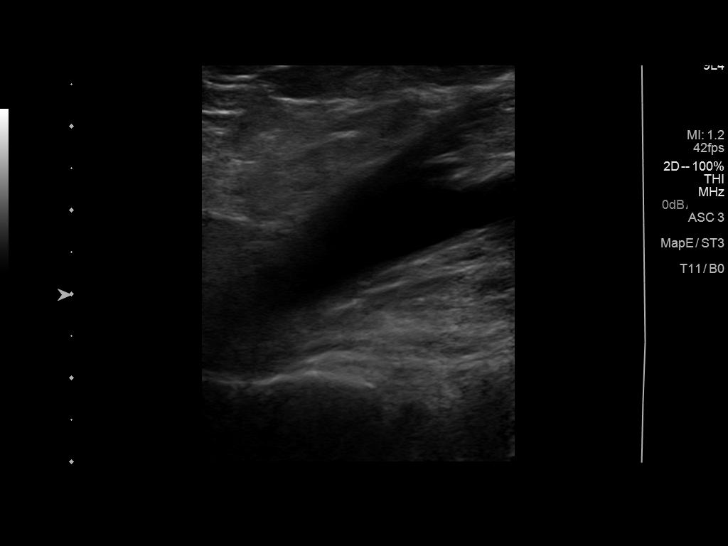
[im 36/63]
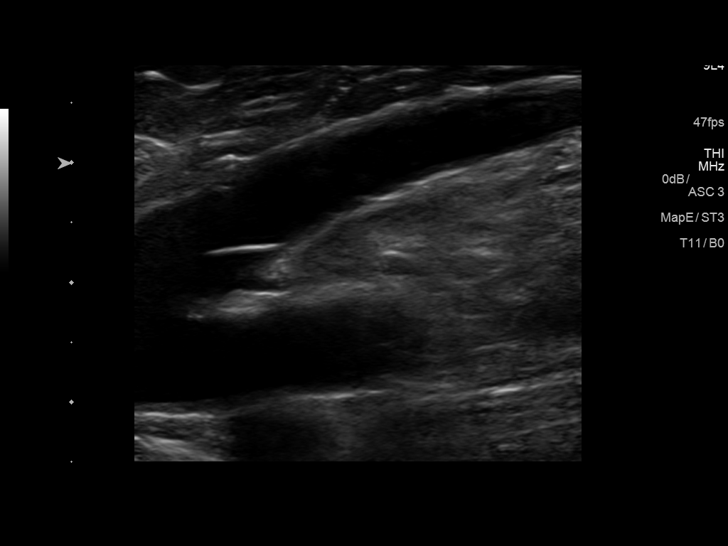
[im 41/63]
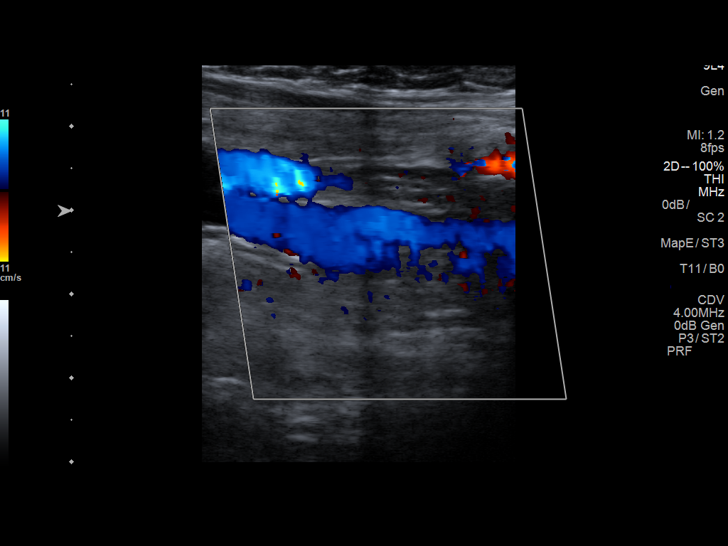
[im 46/63]
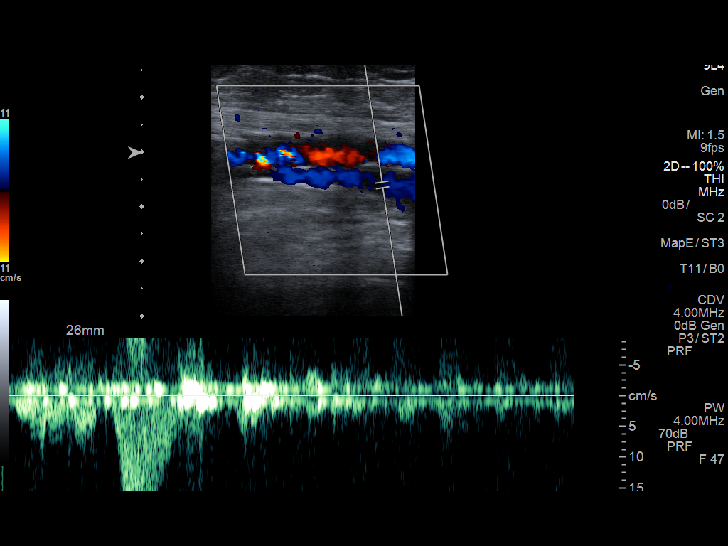
[im 52/63]
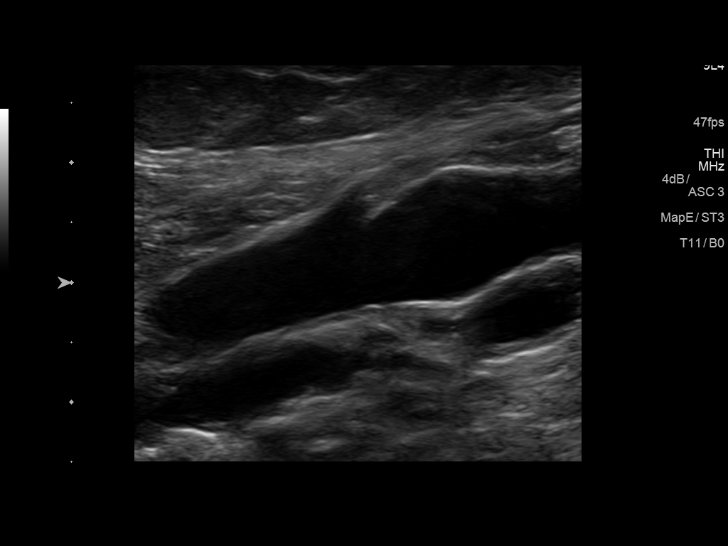
[im 57/63]
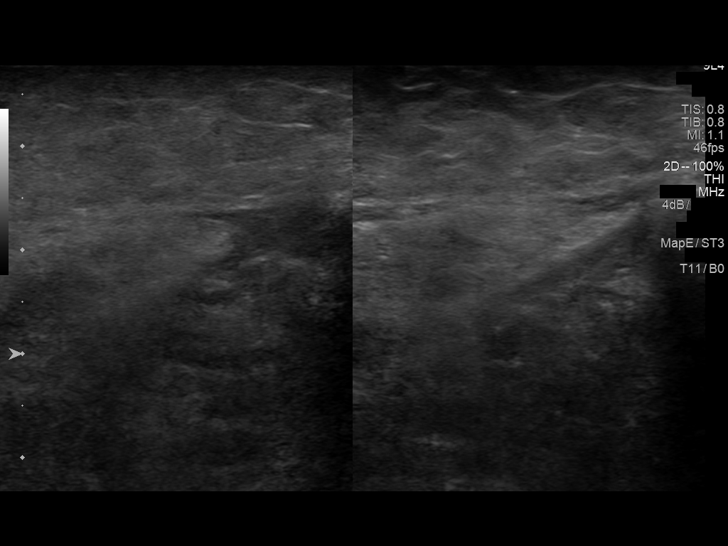
[im 63/63]
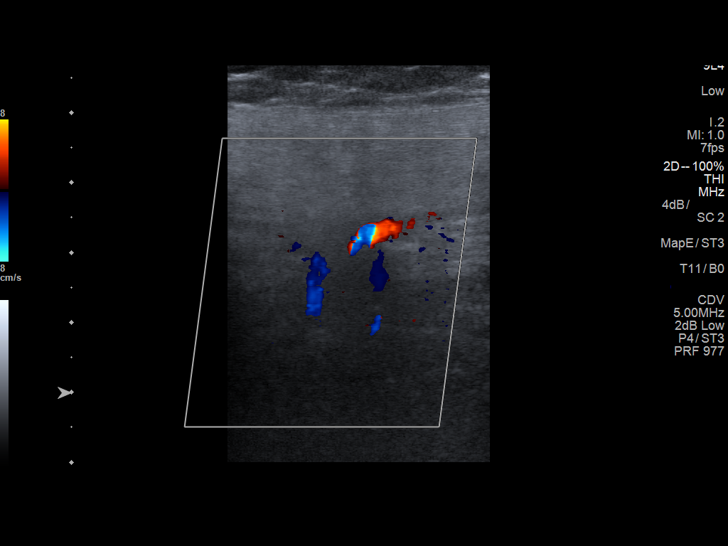

[13 of 24 positions shown; findings below may reference images not displayed]

FINDINGS: RIGHT LOWER EXTREMITY

Common Femoral Vein: No evidence of thrombus. Normal
compressibility, respiratory phasicity and response to augmentation.

Saphenofemoral Junction: No evidence of thrombus. Normal
compressibility and flow on color Doppler imaging.

Profunda Femoral Vein: No evidence of thrombus. Normal
compressibility and flow on color Doppler imaging.

Femoral Vein: No evidence of thrombus. Normal compressibility,
respiratory phasicity and response to augmentation.

Popliteal Vein: No evidence of thrombus. Normal compressibility,
respiratory phasicity and response to augmentation.

Calf Veins: No evidence of thrombus. Normal compressibility and flow
on color Doppler imaging.

Superficial Great Saphenous Vein: No evidence of thrombus. Normal
compressibility.

Venous Reflux:  None.

Other Findings:  None.

LEFT LOWER EXTREMITY

Common Femoral Vein: No evidence of thrombus. Normal
compressibility, respiratory phasicity and response to augmentation.

Saphenofemoral Junction: No evidence of thrombus. Normal
compressibility and flow on color Doppler imaging.

Profunda Femoral Vein: No evidence of thrombus. Normal
compressibility and flow on color Doppler imaging.

Femoral Vein: No evidence of thrombus. Normal compressibility,
respiratory phasicity and response to augmentation.

Popliteal Vein: No evidence of thrombus. Normal compressibility,
respiratory phasicity and response to augmentation.

Calf Veins: No evidence of thrombus. Normal compressibility and flow
on color Doppler imaging.

Superficial Great Saphenous Vein: No evidence of thrombus. Normal
compressibility.

Venous Reflux:  None.

Other Findings:  None.
IMPRESSION: 1. No evidence of deep venous thrombosis in either lower extremity.
2. Increased pulsatility of the venous waveforms bilaterally.
Similar findings can be seen in the setting of elevated right heart
pressures. Common differential considerations include tricuspid
regurgitation, right-sided heart failure, pulmonary arterial
hypertension and long-standing COPD.

## 2020-02-10 IMAGING — US US THORACENTESIS ASP PLEURAL SPACE W/IMG GUIDE
1 series · 2 of 2 positions shown · non-contrast
Comparison: none

INDICATION: LEFT pleural effusion

[Series 1: us thoracentesis asp pleural space w/img guide · 0.24mm/px · 2 of 2 slices shown]
[im 1/2]
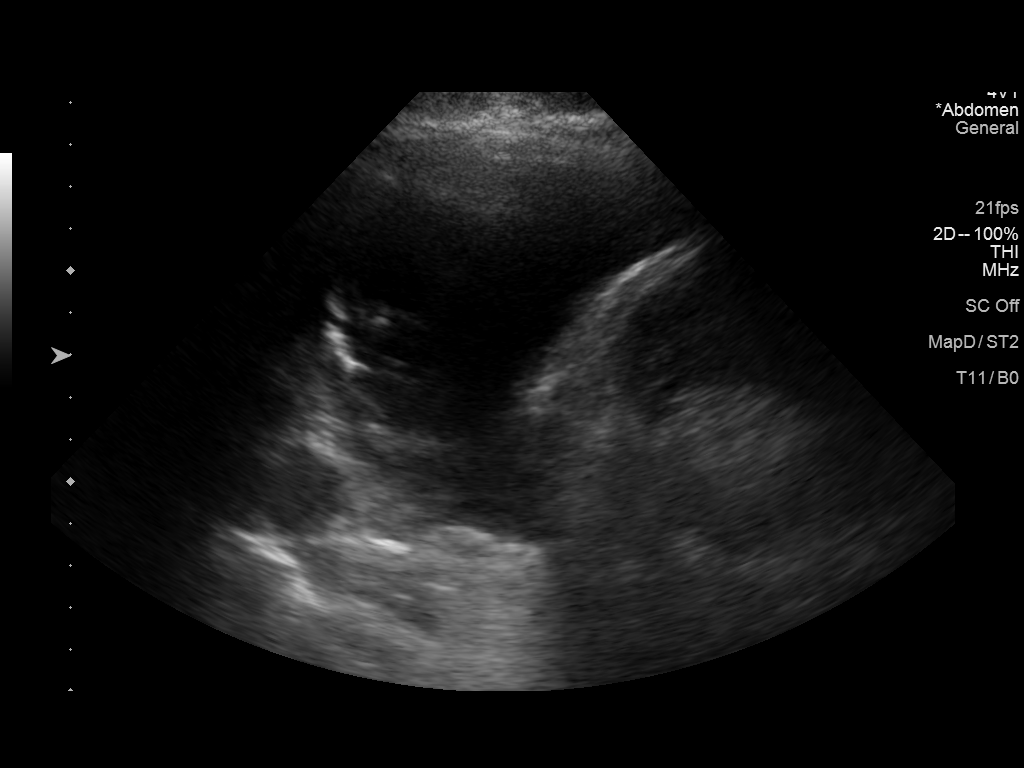
[im 2/2]
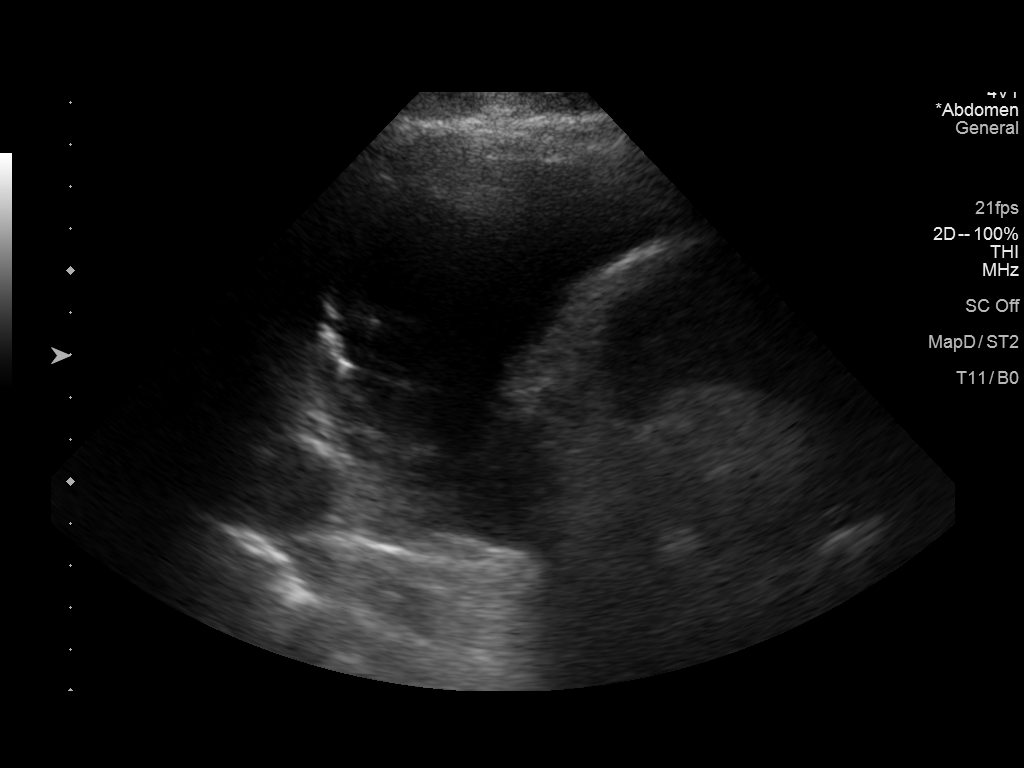

[2 of 2 positions shown; findings below may reference images not displayed]

EXAM:
ULTRASOUND GUIDED DIAGNOSTIC LEFT THORACENTESIS

MEDICATIONS:
None.

COMPLICATIONS:
None immediate.

PROCEDURE:
Procedure, benefits, and risks of procedure were discussed with
patient.

Written informed consent for procedure was obtained.

Time out protocol followed.

Pleural effusion localized by ultrasound at the posterior LEFT
hemithorax.

Skin prepped and draped in usual sterile fashion.

Skin and soft tissues anesthetized with 10 ML of 1% lidocaine.

8 French thoracentesis catheter placed into the LEFT pleural space.

300 mL of clear serous fluid aspirated by syringe pump.

Procedure tolerated well by patient without immediate complication.
FINDINGS: A total of approximately 300 mL of LEFT pleural fluid was removed.

Samples were sent to the laboratory as requested by the clinical
team.
IMPRESSION: Successful ultrasound guided LEFT thoracentesis yielding 300 mL of
pleural fluid.
